# Patient Record
Sex: Female | Born: 1935 | Race: Black or African American | Hispanic: No | Smoking: Former smoker
Health system: Southern US, Community
[De-identification: ages and names within clinical notes are randomized; demographics above are authoritative.]

## PROBLEM LIST (undated history)

## (undated) DIAGNOSIS — D649 Anemia, unspecified: Secondary | ICD-10-CM

## (undated) DIAGNOSIS — M1990 Unspecified osteoarthritis, unspecified site: Secondary | ICD-10-CM

## (undated) DIAGNOSIS — D509 Iron deficiency anemia, unspecified: Secondary | ICD-10-CM

## (undated) DIAGNOSIS — I519 Heart disease, unspecified: Secondary | ICD-10-CM

## (undated) DIAGNOSIS — I5022 Chronic systolic (congestive) heart failure: Secondary | ICD-10-CM

## (undated) DIAGNOSIS — E7800 Pure hypercholesterolemia, unspecified: Secondary | ICD-10-CM

## (undated) DIAGNOSIS — I639 Cerebral infarction, unspecified: Secondary | ICD-10-CM

## (undated) DIAGNOSIS — I442 Atrioventricular block, complete: Secondary | ICD-10-CM

## (undated) DIAGNOSIS — K5900 Constipation, unspecified: Secondary | ICD-10-CM

## (undated) DIAGNOSIS — R51 Headache: Secondary | ICD-10-CM

## (undated) DIAGNOSIS — I255 Ischemic cardiomyopathy: Secondary | ICD-10-CM

## (undated) DIAGNOSIS — R0602 Shortness of breath: Secondary | ICD-10-CM

## (undated) DIAGNOSIS — I10 Essential (primary) hypertension: Secondary | ICD-10-CM

## (undated) DIAGNOSIS — G4730 Sleep apnea, unspecified: Secondary | ICD-10-CM

## (undated) DIAGNOSIS — I2510 Atherosclerotic heart disease of native coronary artery without angina pectoris: Secondary | ICD-10-CM

## (undated) DIAGNOSIS — K900 Celiac disease: Secondary | ICD-10-CM

## (undated) DIAGNOSIS — K219 Gastro-esophageal reflux disease without esophagitis: Secondary | ICD-10-CM

## (undated) DIAGNOSIS — R413 Other amnesia: Secondary | ICD-10-CM

## (undated) HISTORY — DX: Iron deficiency anemia, unspecified: D50.9

## (undated) HISTORY — DX: Chronic systolic (congestive) heart failure: I50.22

## (undated) HISTORY — PX: BACK SURGERY: SHX140

## (undated) HISTORY — DX: Heart disease, unspecified: I51.9

## (undated) HISTORY — DX: Atrioventricular block, complete: I44.2

## (undated) HISTORY — PX: CARDIAC SURGERY: SHX584

## (undated) HISTORY — DX: Constipation, unspecified: K59.00

## (undated) HISTORY — DX: Ischemic cardiomyopathy: I25.5

---
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
## 1998-02-13 ENCOUNTER — Ambulatory Visit (HOSPITAL_COMMUNITY): Admission: RE | Admit: 1998-02-13 | Discharge: 1998-02-13 | Payer: Self-pay | Admitting: Cardiology

## 1998-02-19 ENCOUNTER — Ambulatory Visit (HOSPITAL_COMMUNITY): Admission: RE | Admit: 1998-02-19 | Discharge: 1998-02-19 | Payer: Self-pay | Admitting: Cardiology

## 1998-02-26 ENCOUNTER — Inpatient Hospital Stay (HOSPITAL_COMMUNITY)
Admission: RE | Admit: 1998-02-26 | Discharge: 1998-03-03 | Payer: Self-pay | Admitting: Thoracic Surgery (Cardiothoracic Vascular Surgery)

## 1998-05-08 ENCOUNTER — Encounter (HOSPITAL_COMMUNITY): Admission: RE | Admit: 1998-05-08 | Discharge: 1998-08-06 | Payer: Self-pay | Admitting: Cardiology

## 1999-08-01 ENCOUNTER — Emergency Department (HOSPITAL_COMMUNITY): Admission: EM | Admit: 1999-08-01 | Discharge: 1999-08-01 | Payer: Self-pay | Admitting: *Deleted

## 2000-08-13 ENCOUNTER — Ambulatory Visit (HOSPITAL_COMMUNITY): Admission: RE | Admit: 2000-08-13 | Discharge: 2000-08-13 | Payer: Self-pay | Admitting: Gastroenterology

## 2000-08-13 ENCOUNTER — Encounter (INDEPENDENT_AMBULATORY_CARE_PROVIDER_SITE_OTHER): Payer: Self-pay | Admitting: *Deleted

## 2000-09-18 ENCOUNTER — Encounter: Admission: RE | Admit: 2000-09-18 | Discharge: 2000-09-18 | Payer: Self-pay

## 2000-10-01 ENCOUNTER — Encounter: Admission: RE | Admit: 2000-10-01 | Discharge: 2000-10-01 | Payer: Self-pay

## 2000-10-02 ENCOUNTER — Ambulatory Visit (HOSPITAL_BASED_OUTPATIENT_CLINIC_OR_DEPARTMENT_OTHER): Admission: RE | Admit: 2000-10-02 | Discharge: 2000-10-02 | Payer: Self-pay

## 2000-10-02 ENCOUNTER — Encounter (INDEPENDENT_AMBULATORY_CARE_PROVIDER_SITE_OTHER): Payer: Self-pay

## 2000-10-08 ENCOUNTER — Emergency Department (HOSPITAL_COMMUNITY): Admission: EM | Admit: 2000-10-08 | Discharge: 2000-10-08 | Payer: Self-pay | Admitting: Emergency Medicine

## 2001-04-05 ENCOUNTER — Encounter: Payer: Self-pay | Admitting: Family Medicine

## 2001-04-05 ENCOUNTER — Ambulatory Visit (HOSPITAL_COMMUNITY): Admission: RE | Admit: 2001-04-05 | Discharge: 2001-04-05 | Payer: Self-pay | Admitting: Family Medicine

## 2001-07-26 ENCOUNTER — Emergency Department (HOSPITAL_COMMUNITY): Admission: EM | Admit: 2001-07-26 | Discharge: 2001-07-26 | Payer: Self-pay | Admitting: Emergency Medicine

## 2001-07-26 ENCOUNTER — Encounter: Payer: Self-pay | Admitting: Emergency Medicine

## 2003-11-20 ENCOUNTER — Encounter: Admission: RE | Admit: 2003-11-20 | Discharge: 2003-11-20 | Payer: Self-pay | Admitting: Internal Medicine

## 2003-12-10 ENCOUNTER — Emergency Department (HOSPITAL_COMMUNITY): Admission: EM | Admit: 2003-12-10 | Discharge: 2003-12-10 | Payer: Self-pay | Admitting: *Deleted

## 2003-12-15 ENCOUNTER — Encounter: Admission: RE | Admit: 2003-12-15 | Discharge: 2003-12-15 | Payer: Self-pay | Admitting: Internal Medicine

## 2003-12-15 ENCOUNTER — Ambulatory Visit (HOSPITAL_COMMUNITY): Admission: RE | Admit: 2003-12-15 | Discharge: 2003-12-15 | Payer: Self-pay | Admitting: Internal Medicine

## 2003-12-22 ENCOUNTER — Encounter: Admission: RE | Admit: 2003-12-22 | Discharge: 2003-12-22 | Payer: Self-pay | Admitting: Internal Medicine

## 2004-06-10 ENCOUNTER — Encounter: Admission: RE | Admit: 2004-06-10 | Discharge: 2004-06-10 | Payer: Self-pay | Admitting: Internal Medicine

## 2004-10-01 ENCOUNTER — Encounter (INDEPENDENT_AMBULATORY_CARE_PROVIDER_SITE_OTHER): Payer: Self-pay | Admitting: *Deleted

## 2004-10-01 ENCOUNTER — Ambulatory Visit (HOSPITAL_COMMUNITY): Admission: RE | Admit: 2004-10-01 | Discharge: 2004-10-01 | Payer: Self-pay | Admitting: Gastroenterology

## 2004-12-04 ENCOUNTER — Ambulatory Visit: Payer: Self-pay | Admitting: Internal Medicine

## 2005-12-18 ENCOUNTER — Encounter: Admission: RE | Admit: 2005-12-18 | Discharge: 2005-12-18 | Payer: Self-pay | Admitting: Internal Medicine

## 2006-05-07 ENCOUNTER — Encounter: Admission: RE | Admit: 2006-05-07 | Discharge: 2006-05-07 | Payer: Self-pay | Admitting: Internal Medicine

## 2006-09-30 ENCOUNTER — Inpatient Hospital Stay (HOSPITAL_COMMUNITY): Admission: RE | Admit: 2006-09-30 | Discharge: 2006-10-02 | Payer: Self-pay | Admitting: Neurosurgery

## 2007-03-09 ENCOUNTER — Encounter: Admission: RE | Admit: 2007-03-09 | Discharge: 2007-03-09 | Payer: Self-pay | Admitting: Internal Medicine

## 2007-11-21 ENCOUNTER — Ambulatory Visit (HOSPITAL_BASED_OUTPATIENT_CLINIC_OR_DEPARTMENT_OTHER): Admission: RE | Admit: 2007-11-21 | Discharge: 2007-11-21 | Payer: Self-pay | Admitting: Internal Medicine

## 2007-11-27 ENCOUNTER — Ambulatory Visit: Payer: Self-pay | Admitting: Internal Medicine

## 2008-10-23 ENCOUNTER — Encounter: Admission: RE | Admit: 2008-10-23 | Discharge: 2008-10-23 | Payer: Self-pay | Admitting: Internal Medicine

## 2009-01-05 ENCOUNTER — Emergency Department (HOSPITAL_COMMUNITY): Admission: AC | Admit: 2009-01-05 | Discharge: 2009-01-05 | Payer: Self-pay | Admitting: Emergency Medicine

## 2009-04-16 ENCOUNTER — Encounter: Admission: RE | Admit: 2009-04-16 | Discharge: 2009-04-16 | Payer: Self-pay | Admitting: Internal Medicine

## 2009-10-24 ENCOUNTER — Encounter: Admission: RE | Admit: 2009-10-24 | Discharge: 2009-10-24 | Payer: Self-pay | Admitting: Internal Medicine

## 2010-02-21 ENCOUNTER — Emergency Department (HOSPITAL_COMMUNITY): Admission: EM | Admit: 2010-02-21 | Discharge: 2010-02-21 | Payer: Self-pay | Admitting: Emergency Medicine

## 2011-01-10 ENCOUNTER — Other Ambulatory Visit: Payer: Self-pay | Admitting: Internal Medicine

## 2011-01-10 DIAGNOSIS — Z1231 Encounter for screening mammogram for malignant neoplasm of breast: Secondary | ICD-10-CM

## 2011-01-10 DIAGNOSIS — M8580 Other specified disorders of bone density and structure, unspecified site: Secondary | ICD-10-CM

## 2011-01-20 ENCOUNTER — Other Ambulatory Visit: Payer: Self-pay

## 2011-01-20 ENCOUNTER — Ambulatory Visit: Payer: Self-pay

## 2011-01-30 ENCOUNTER — Other Ambulatory Visit: Payer: Self-pay

## 2011-01-30 ENCOUNTER — Ambulatory Visit: Payer: Self-pay

## 2011-01-30 LAB — GLUCOSE, CAPILLARY: Glucose-Capillary: 94 mg/dL (ref 70–99)

## 2011-02-06 ENCOUNTER — Ambulatory Visit
Admission: RE | Admit: 2011-02-06 | Discharge: 2011-02-06 | Disposition: A | Discharge status: Home / Self Care | Payer: MEDICARE | Source: Ambulatory Visit | Attending: Internal Medicine | Admitting: Internal Medicine

## 2011-02-06 DIAGNOSIS — Z1231 Encounter for screening mammogram for malignant neoplasm of breast: Secondary | ICD-10-CM

## 2011-02-06 DIAGNOSIS — M8580 Other specified disorders of bone density and structure, unspecified site: Secondary | ICD-10-CM

## 2011-03-04 NOTE — Procedures (Signed)
[name], [name]             ACCOUNT NO.:  [id number]   MEDICAL RECORD NO.:  [id number]          PATIENT TYPE:  OUT   LOCATION:  SLEEP CENTER                 FACILITY:  [facility]   PHYSICIAN:  Clinton D. Annamaria Boots, MD, FCCP, FACPDATE OF BIRTH:  1930[birth year]   DATE OF STUDY:  11/21/2007                            NOCTURNAL POLYSOMNOGRAM   REFERRING PHYSICIAN:   INDICATION FOR STUDY:  Hypersomnia with sleep apnea.   EPWORTH SLEEPINESS SCORE:  Age 27 years, BMI 33.1, weight 199 pounds,  height 65 inches, neck 14.5 inches.   MEDICATIONS:  Home medications charted and reviewed.   SLEEP ARCHITECTURE:  Split study protocol.  During the diagnostic phase,  total sleep time 114.5 minutes with sleep efficiency 75.6%.  Stage 1 was  12.2% and stage 2 87.8%.  Stages 3 and REM were absent.  Sleep latency  28 minutes.  Awake after sleep onset 8.5 minutes.  Arousal index 17.8.  No bedtime medication was taken.   RESPIRATORY DATA:  Split study protocol.  Apnea hypopnea index (AHI) of  77 respiratory events per hour, indicating severe obstructive sleep  apnea/hypopnea syndrome.  There were 147 total events with 144  obstructive apneas, 1 central apnea, and 2 hypopneas.  Most events were  recorded while sleeping off of the flat of her back.  CPAP was titrated  to 15 CWP, AHI zero per hour.  She chose a medium size Mirage Quattro  full-face mask with heated humidifier.   OXYGEN DATA:  Moderately loud snoring before CPAP with oxygen  desaturation to a nadir of 80%.  After CPAP titration, oxygen saturation  held at a mean of 95.3% on room air.   CARDIAC DATA:  Normal sinus rhythm.   MOVEMENT-PARASOMNIA:  No significant movement disturbance.  One bathroom  trip.   IMPRESSIONS-RECOMMENDATIONS:  1. Severe obstructive sleep apnea/hypopnea syndrome, AHI 77 per hour.      Most events were recorded while sleeping off the flat of her back.      Moderately loud snoring with oxygen desaturation to a nadir of  80%.  2. Successful CPAP titration to 15 CWP, AHI zero.  She chose a medium      Mirage Quattro mask with heated humidifier.      Clinton D. Annamaria Boots, MD, FCCP, FACP  Diplomate, Tax adviser of Sleep Medicine  Electronically Signed     CDY/MEDQ  D:  11/27/2007 17:53:51  T:  11/29/2007 11:56:19  Job:  [id number]

## 2011-03-07 NOTE — H&P (Signed)
NAMEREAH, [name]             ACCOUNT NO.:  [id number]   MEDICAL RECORD NO.:  [id number]          PATIENT TYPE:  INP   LOCATION:  3006                         FACILITY:  [facility]   PHYSICIAN:  Elizabeth Sauer, M.D.      DATE OF BIRTH:  30-Nov-1927   DATE OF ADMISSION:  09/30/2006  DATE OF DISCHARGE:                              HISTORY & PHYSICAL   ADMISSION DIAGNOSIS:  Spondylosis L3-4, L4-5.   HISTORY:  This is a 77 year old right-handed black lady, who off and on  for a number of years had pain in her back and down her legs.  Recently  had more pain in her leg.  When she is up on them, they bother her a  lot.  She had trouble getting around.  MR demonstrates stenosis at 3-4  and 4-5.  She is admitted for decompression.   PAST MEDICAL HISTORY:  Remarkable for coronary artery disease.   PAST SURGICAL HISTORY:  Bypass nine years ago and has not had any  trouble with it.  She had a stress test in early October and did very  well.   MEDICATIONS:  1. Amlodipine once a day.  2. Actos once a day.  3. Avapro once a day.  4. Metformin twice a day.  5. Glipizide twice a day.  6. Vytorin once a day.  7. Bayer aspirin.   ALLERGIES:  None.   SOCIAL HISTORY:  She does not smoke or drink.  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   FAMILY HISTORY:  Her mom died at 28 with diabetes.  Dad died at 38 with  cancer.   REVIEW OF SYSTEMS:  Remarkable for night sweats, wearing glasses,  glaucoma, and cataracts.  Hypertension, heart murmur,  hypercholesterolemia, leg pain, leg weakness, back pain, joint pain,  arthritis, neck, pain, and diabetes.   PHYSICAL EXAMINATION:  HEENT:  Within normal limits.  NECK:  She has good range of motion of her neck.  CHEST:  Clear.  CARDIAC:  Shows regular rate and rhythm.  ABDOMEN:  Nontender with no hepatosplenomegaly.  EXTREMITIES:  Without clubbing or cyanosis.  Peripheral pulses are good.  GU:  Deferred.  NEUROLOGICALLY:  She is awake, alert,  and oriented.  Her cranial nerves  are intact.  Motor exam shows 5/5 strength throughout the upper and  lower extremities when seated.  Sensory dysesthesia is described in  nondermatomal lower extremities and worse when she is up and about.  Reflexes are absent in the lower extremities.   MR demonstrates mild stenosis at 3-4 and 4-5.  In 4-5 there is a small  disc off to the right.  The majority of the compression comes from  ligamentum flavum hypertrophy and spondylosis of the facet joints.   CLINICAL IMPRESSION:  Neurogenic claudication secondary to spinal  stenosis.   PLAN:  The plan is for decompression at 3-4 and 4-5 with laminotomy and  foraminotomy.  The risks and benefits have been discussed with her and  she wishes to proceed.           ______________________________  Elta Guadeloupe  Mardene Sayer, M.D.     MWR/MEDQ  D:  09/30/2006  T:  10/01/2006  Job:  [id number]

## 2011-03-07 NOTE — Op Note (Signed)
NAMEADRIANNAH, [name]             ACCOUNT NO.:  [id number]   MEDICAL RECORD NO.:  [id number]          PATIENT TYPE:  INP   LOCATION:  3006                         FACILITY:  [facility]   PHYSICIAN:  Elizabeth Sauer, M.D.      DATE OF BIRTH:  [birth year]   DATE OF PROCEDURE:  09/30/2006  DATE OF DISCHARGE:                               OPERATIVE REPORT   PREOPERATIVE DIAGNOSIS:  Spondylosis L3-4, L4-5.   POSTOPERATIVE DIAGNOSIS:  Spondylosis L3-4, L4-5.   OPERATIVE PROCEDURE:  L3-4, L4-5 laminotomy and foraminotomy done  bilaterally.   DICTATING DOCTOR:  Elizabeth Sauer, MD   ANESTHESIA:  General endotracheal.   PREPARATION:  Prep and scrub with alcohol wipe.   COMPLICATIONS:  None.   NURSE ASSISTANT:  [place].   DOCTOR ASSISTANT:  Leeroy Cha, M.D.   BODY OF TEXT:  A 77 year old with severe lumbar spondylosis.   DESCRIPTION OF PROCEDURE:  She was taken to the operating room and  intubated, placed prone on the operating table.  Following shave, prep,  and drape in the usual sterile fashion, the skin was infiltrated with 1%  lidocaine with 1:100,000 epinephrine.  The skin was incised from mid L2  to mid L5 and the lamina of L3 and L4 were exposed bilaterally in the  subperiosteal plane.  Intraoperative x-ray confirmed correctness level.  Bilateral laminotomy and foraminotomy at L3 and L4 were done using the  high speed drill.  This was carried to the top of the ligamentum flavum  and was removed in a retrograde fashion.  The lateral recesses were also  decompressed.  The facet joints were left intact, and a generous portion  of the pars was also left intact.  Following complete decompression, the  neural foramen and the canal were explored with a hook and found to be  open.  The wound was irrigated.  The laminotomy defects covered with  __________  soaked pad.  The 0-Vicryl, 2-0 Vicryl, and 3-0 nylon were  used to close, a Betadine and Telfa dressing was applied, and the  patient  returned to the recovery room in good condition.           ______________________________  Elizabeth Sauer, M.D.     MWR/MEDQ  D:  09/30/2006  T:  10/01/2006  Job:  [id number]

## 2011-03-07 NOTE — Op Note (Signed)
[place]. [facility]  Patient:    [name], [name]                    MRN: [id number] Proc. Date: 10/02/00 Adm. Date:  10/02/00 Attending:  Juanita Craver                           Operative Report  PREOPERATIVE DIAGNOSIS:  Bloody right nipple drainage, probable intraductal papilloma.  POSTOPERATIVE DIAGNOSIS:  Bloody right nipple drainage, probable intraductal papilloma.  PROCEDURE:  Right breast biopsy.  SURGEON:  Ward Givens, M.D.  ANESTHESIA:  Local with sedation.  DESCRIPTION OF PROCEDURE:  After the patient was adequately sedated and monitored, and after routine preparation and draping of the right breast, I liberally infiltrated the area under the nipple and areola and lateral to it with 0.50% bupivacaine with epinephrine.  The patient was comfortable throughout the procedure after the block was done.  I made a lateral circumareolar incision and dissected through the skin and then dissected the skin of the nipple and areola up from the underlying tissues.  As I cut across the ducts going to the nipple I saw blood coming from the duct, and I grasped that duct with an Allis clamp.  I did a generous biopsy around it including essentially all of the subareolar ducts going right up to the nipple.  I did not explore the tissue to try to locate a mass. After getting good hemostasis with cautery I closed the skin with a intercuticular 4-0 Vicryl and Steri-Strips and applied a slightly compressive bandage.  The patient tolerated the operation well. DD:  10/02/00 TD:  10/02/00 Job: [id number]

## 2011-03-07 NOTE — Op Note (Signed)
NAMENETASHA, [name]             ACCOUNT NO.:  [id number]   MEDICAL RECORD NO.:  [id number]          PATIENT TYPE:  AMB   LOCATION:  ENDO                         FACILITY:  [facility]   PHYSICIAN:  Tory Emerald. Benson Norway, MD    DATE OF BIRTH:  03/25/1928   DATE OF PROCEDURE:  10/01/2004  DATE OF DISCHARGE:                                 OPERATIVE REPORT   PROCEDURE:  Colonoscopy.   INDICATIONS FOR PROCEDURE:  Screening colonoscopy and a prior history of  polyps.   ENDOSCOPIST:  Tory Emerald. Benson Norway, M.D.   REFERRING PHYSICIAN:  Logan Bores, M.D.   PHYSICAL EXAMINATION:  CARDIOVASCULAR:  Regular rate and rhythm with some  ectopic beats.  LUNGS:  Clear to auscultation bilaterally.  ABDOMEN:  Obese, soft, nontender and nondistended.  Positive bowel sounds.   MEDICATIONS:  Versed 6 mg IV and Demerol 60 mg IV.   DESCRIPTION OF PROCEDURE:  After adequate sedation was achieved a rectal  examination was performed which was negative for any palpable abnormalities.  The colonoscope was then inserted and advanced from the anus to the terminal  ileum with moderate difficulty.  The patient was noted to have a very  redundant and long colon as well as a floppy abdominal wall which required  multiple repositioning in order to achieve intubation of the terminal ileum.  The patient was noted to have an excellent prep. Photo documentation of the  terminal ileum and the cecum was obtained and upon slow withdrawal of the  colonoscope from the cecum, there was no evidence of any masses, polyps,  ulcerations, erosions, inflammation or vascular abnormalities in the cecum,  ascending, transverse, descending or sigmoid colon.  The patient is noted to  have a very mild pan diverticulosis.  Upon entry into the rectosigmoid  region, the patient is documented to have multiple sessile polyps which  grossly appear to be hyperplastic in nature.  Two cold snare biopsies were  obtained as representative samples of these  polyps.  The colonoscope was  then retroflexed in the rectum and the patient is documented to have a mild  external hemorrhoid.  The colonoscope was then straightened and withdrawn  from the patient and the procedure was terminated.  No complications were  encountered and the patient tolerated the procedure well.   PLAN:  1.  Follow-up with biopsies.  2.  Tentatively repeat colonoscopy in 5 years.       PDH/MEDQ  D:  10/01/2004  T:  10/01/2004  Job:  [id number]   cc:   Logan Bores, M.D.  [address]  [place] [address]  Fax: [phone]

## 2011-03-07 NOTE — Discharge Summary (Signed)
[place]. [facility]  Patient:    [name], [name] Visit Number: [id number] MRN: [id number]          Service Type: EMS Location: ED Attending Physician:  Lacretia Leigh Dictated by:   Jerilynn Mages. Glenford Peers, P.A. Admit Date:  07/26/2001 Discharge Date: 07/26/2001   CC:         Delrae Alfred B. Little, M.D.             Revonda Standard Roxan Hockey, M.D.                           Discharge Summary  ADMISSION DIAGNOSES: 1. Coronary artery disease. 2. Non-insulin-dependent diabetes mellitus. 3. Crescendo angina. 4. Hypertension. 5. History of thyroid nodule. 6. Sciatica. 7. History of pneumonia in January of this year. 8. Family history of cancer, diabetes but no heart disease.  ADMISSION MEDICATION:  Glucophage 500 mg two tablets p.o. q.a.m. 1 tablet p.m.  PROCEDURES:  ______ on Feb 27, 1998, by Dr. Modesto Charon.  Grafts performed included the LIMA to the LAD, saphenous vein graft to the PD, saphenous vein graft to the circ (OM), and a saphenous vein graft to the ramus.  The patient was aware of the risks, benefits, and alternatives for these procedures and was agreeable to procedure.  She suffered no intraoperative complications and left the OR in stable condition.  HOSPITAL COURSE:  The patient is a 77 year old African-American female patient of Dr. Aldona Bar who has known three-vessel LAD. She was referred to Dr. Rex Kras after having episodes of continued exertional chest pressure since January of this year.  She also suffered an episode of pneumonia during that month.  She reports a notable increase in the frequency of her chest pressure now and it occurs at night while she is trying to sleep. There is no dictated history and physical on the patients chart, but she was admitted at this time for further evaluation and work-up with elective CABG planned.  She is a non-insulin-dependent diabetic.  She also has hypertension but no family history of  CAD.  After her procedure she was transferred to the surgical intensive care unit in stable condition and remained intubated.  She was being atrially paced at 90.  Cardiac index 2.2 and she was on nitroglycerin.  Initial hematocrit was 21 and she was transfused with 1 unit of packed red blood cells.  Plan was to wean to extubate.  Postop day #1, the patient was 22 pounds over her preoperative weight and was complaining of incisional pain.  T-max was 101.1, blood pressure was stable and she was in a sinus rhythm.  Cardiac index was greater than 2 and she was diuresed well.  Her hemoglobin and hematocrit were 11 and 33.  BUN and creatinine were stable and her CBG was 188.  The plan was to remove her Luiz Blare, diurese and start her on Lopressor.  She was transferred to the floor later that day.  By the following day she was still quite lethargic and would only wake up for short periods of time.  She got a rather large effect from even one Tylox.  Her chest tube was still intact on Feb 28, 1998, but had minimal drainage and was discontinued.  Her weight was approximately 18 pounds over her preoperative weight but her urine output was good.  Systolic blood pressure was 100 to 120, heart rate 78, and O2 saturations were 99% on 2 L.  The plan  was to wean her off of oxygen, start oral Lasix and remove her Foley.  Her pain meds were decreased.  On Mar 01, 1998, hemoglobin and hematocrit were 8.3 and 24 and she was started on iron and folate.  Her glucose levels were well-controlled but she needed some management with her bowel movements.  She was slightly hypokalemic and this was replaced.  By day #4, she was much improved.  She was only 9 pounds over her preoperative weight and had a T-max of 100.5.  Her O2 saturation was 96% on room air.  CBGs continued to be well-controlled and she was making good progress with ambulation.  She still had episodic shortness of breath.  Her hemoglobin and  hematocrit continued to drift downwards.  The plan was to discharge her home in the next 24-48 hours if she continued to progress.  If she became more profoundly anemic, she would be transfused.  DISCHARGE DIAGNOSES:  1. Status post coronary artery bypass graft x 5.  2. Postoperative anemia.  3. Hypokalemia.  4. Hypertension.  5. Non-insulin-dependent diabetes mellitus.  6. Coronary artery disease.  7. History of thyroid nodule.  8. Sciatica.  DISCHARGE MEDICATIONS:  1. FeSO4 325 mg after lunch and supper.  2. Folic acid 1 mg with supper.  3. Coated aspirin q.d.  4. OptiPranolol 3% one drop daily each eye.  5. Alphagan 2% one drop two times a day each eye.  6. Pepcid 20 mg b.i.d.  7. Lopressor 50 mg one-half tablet b.i.d.  8. Glucophage 500 mg two times a day with meals, to be increased     as her intake increases.  9. Darvocet-N 100 1-2 q.4-6h. as needed. 10. Lasix 40 mg q.d. 11. K-Dur 20 mEq q.d.  FOLLOW-UP:  Should be with Dr. Rex Kras in two weeks, Dr. Roxan Hockey in three weeks with a chest x-ray from [facility].  Her staples will be removed at the CVTS office on May 21 at 9 a.m. Dictated by:   Jerilynn Mages. Glenford Peers, P.A. Attending Physician:  Lacretia Leigh DD:  03/02/98 TD:  03/03/98 Job: [id number]

## 2011-03-07 NOTE — Procedures (Signed)
[place]. [facility]  Patient:    [name], [name]                    MRN: [id number] Proc. Date: 08/13/00 Adm. Date:  [id number] Attending:  Juanita Craver CC:         Guinevere Ferrari, M.D.   Procedure Report  DATE OF BIRTH:  08-30-1928  REFERRING PHYSICIAN:  Guinevere Ferrari, M.D.  PROCEDURE PERFORMED:  Colonoscopy with snare polypectomy x 1.  ENDOSCOPIST:  Nelwyn Salisbury, M.D.  INSTRUMENT USED:  Olympus video colonoscope.  INDICATIONS FOR PROCEDURE:  Screening flexible sigmoidoscopy being performed in a 77 year old black female rule out colonic polyps, masses, hemorrhoids, etc.  PREPROCEDURE PREPARATION:  Informed consent was procured from the patient. The patient was fasted for eight hours prior to the procedure and prepped with a bottle of magnesium citrate and a gallon of NuLytely the night prior to the procedure.  PREPROCEDURE PHYSICAL:  The patient had stable vital signs.  Neck supple. Chest clear to auscultation.  S1, S2 regular.  No murmur, rub or gallop.  No rales, rhonchi or wheezing.  Abdomen soft with normal abdominal bowel sounds.  DESCRIPTION OF PROCEDURE:  The patient was placed in the left lateral decubitus position and sedated with 40 mg of Demerol and 4 mg of Versed intravenously.  Once the patient was adequately sedated and maintained on low-flow oxygen and continuous cardiac monitoring, the Olympus video colonoscope was advanced from the rectum to the cecum without difficulty.  The patient had a fairly good prep.  A flat polyp was removed from the right colon just distal to the cecum by snare polypectomy.  The  rest of the colonic mucosa appeared healthy without lesions, no other masses, polyps, erosions, ulcerations or diverticula were present.  No evidence of hemorrhoids.  The patient tolerated the procedure well without complication.  IMPRESSION:  Flat polyp removed by snare polypectomy from the right colon distal to the  cecum, otherwise normal colonoscopy.  RECOMMENDATIONS: 1. Await pathology results. 2. No nonsteroidals for the next two weeks. 3. Outpatient follow-up in the next two weeks. DD:  08/13/00 TD:  08/14/00 Job: [id number]

## 2011-03-10 ENCOUNTER — Ambulatory Visit
Admission: RE | Admit: 2011-03-10 | Discharge: 2011-03-10 | Disposition: A | Discharge status: Home / Self Care | Payer: Medicare Other | Source: Ambulatory Visit | Attending: Cardiology | Admitting: Cardiology

## 2011-03-10 ENCOUNTER — Other Ambulatory Visit: Payer: Self-pay | Admitting: Cardiology

## 2011-03-10 DIAGNOSIS — R0602 Shortness of breath: Secondary | ICD-10-CM

## 2011-08-06 ENCOUNTER — Inpatient Hospital Stay (HOSPITAL_COMMUNITY)
Admission: EM | Admit: 2011-08-06 | Discharge: 2011-08-08 | DRG: 069 | Disposition: A | Discharge status: Home / Self Care | Payer: Medicare Other | Source: Other Acute Inpatient Hospital | Attending: Neurology | Admitting: Neurology

## 2011-08-06 ENCOUNTER — Emergency Department (HOSPITAL_COMMUNITY): Payer: Medicare Other

## 2011-08-06 ENCOUNTER — Emergency Department (HOSPITAL_COMMUNITY)
Admission: EM | Admit: 2011-08-06 | Discharge: 2011-08-06 | Disposition: A | Discharge status: Other Acute Inpatient Hospital | Payer: Medicare Other | Source: Home / Self Care | Attending: Emergency Medicine | Admitting: Emergency Medicine

## 2011-08-06 DIAGNOSIS — E785 Hyperlipidemia, unspecified: Secondary | ICD-10-CM | POA: Diagnosis present

## 2011-08-06 DIAGNOSIS — Z7902 Long term (current) use of antithrombotics/antiplatelets: Secondary | ICD-10-CM

## 2011-08-06 DIAGNOSIS — G8190 Hemiplegia, unspecified affecting unspecified side: Secondary | ICD-10-CM | POA: Diagnosis present

## 2011-08-06 DIAGNOSIS — Z723 Lack of physical exercise: Secondary | ICD-10-CM

## 2011-08-06 DIAGNOSIS — G459 Transient cerebral ischemic attack, unspecified: Principal | ICD-10-CM | POA: Diagnosis present

## 2011-08-06 DIAGNOSIS — I10 Essential (primary) hypertension: Secondary | ICD-10-CM | POA: Diagnosis present

## 2011-08-06 DIAGNOSIS — M1710 Unilateral primary osteoarthritis, unspecified knee: Secondary | ICD-10-CM | POA: Diagnosis present

## 2011-08-06 DIAGNOSIS — Z6830 Body mass index (BMI) 30.0-30.9, adult: Secondary | ICD-10-CM

## 2011-08-06 DIAGNOSIS — E119 Type 2 diabetes mellitus without complications: Secondary | ICD-10-CM | POA: Diagnosis present

## 2011-08-06 DIAGNOSIS — G4733 Obstructive sleep apnea (adult) (pediatric): Secondary | ICD-10-CM | POA: Diagnosis present

## 2011-08-06 LAB — CK TOTAL AND CKMB (NOT AT ARMC)
CK, MB: 2 ng/mL (ref 0.3–4.0)
Relative Index: 1.8 (ref 0.0–2.5)
Total CK: 110 U/L (ref 7–177)

## 2011-08-06 LAB — CBC
HCT: 35 % — ABNORMAL LOW (ref 36.0–46.0)
Hemoglobin: 11 g/dL — ABNORMAL LOW (ref 12.0–15.0)
MCH: 27.9 pg (ref 26.0–34.0)
MCHC: 31.4 g/dL (ref 30.0–36.0)
MCV: 88.8 fL (ref 78.0–100.0)
RBC: 3.94 MIL/uL (ref 3.87–5.11)

## 2011-08-06 LAB — DIFFERENTIAL
Basophils Absolute: 0 10*3/uL (ref 0.0–0.1)
Basophils Relative: 0 % (ref 0–1)
Neutro Abs: 3 10*3/uL (ref 1.7–7.7)
Neutrophils Relative %: 45 % (ref 43–77)

## 2011-08-06 LAB — APTT: aPTT: 37 seconds (ref 24–37)

## 2011-08-06 LAB — COMPREHENSIVE METABOLIC PANEL
Albumin: 4 g/dL (ref 3.5–5.2)
Alkaline Phosphatase: 95 U/L (ref 39–117)
CO2: 29 mEq/L (ref 19–32)
Creatinine, Ser: 1.08 mg/dL (ref 0.50–1.10)
GFR calc Af Amer: 54 mL/min — ABNORMAL LOW (ref >90)
GFR calc non Af Amer: 47 mL/min — ABNORMAL LOW (ref >90)
Glucose, Bld: 191 mg/dL — ABNORMAL HIGH (ref 70–99)
Potassium: 3.9 mEq/L (ref 3.5–5.1)
Total Bilirubin: 0.3 mg/dL (ref 0.3–1.2)

## 2011-08-06 LAB — TROPONIN I: Troponin I: <0.3 ng/mL (ref <0.30)

## 2011-08-07 ENCOUNTER — Inpatient Hospital Stay (HOSPITAL_COMMUNITY): Payer: Medicare Other

## 2011-08-07 LAB — LIPID PANEL
HDL: 47 mg/dL (ref >39)
LDL Cholesterol: 160 mg/dL — ABNORMAL HIGH (ref 0–99)
Total CHOL/HDL Ratio: 4.8 RATIO

## 2011-08-07 LAB — GLUCOSE, CAPILLARY
Glucose-Capillary: 85 mg/dL (ref 70–99)
Glucose-Capillary: 142 mg/dL — ABNORMAL HIGH (ref 70–99)

## 2011-08-08 LAB — GLUCOSE, CAPILLARY
Glucose-Capillary: 206 mg/dL — ABNORMAL HIGH (ref 70–99)
Glucose-Capillary: 140 mg/dL — ABNORMAL HIGH (ref 70–99)

## 2011-08-11 NOTE — Discharge Summary (Signed)
[name], [name]             ACCOUNT NO.:  [id number]  MEDICAL RECORD NO.:  [id number]  LOCATION:  2993                         FACILITY:  [facility]  PHYSICIAN:  Sruti Ayllon P. Leonie Man, MD    DATE OF BIRTH:  04-Feb-1928  DATE OF ADMISSION:  08/06/2011 DATE OF DISCHARGE:  08/08/2011                              DISCHARGE SUMMARY   DIAGNOSES AT THE TIME OF DISCHARGE: 1. Left hemispheric transient ischemic attack. 2. Osteoarthritis affecting both knees, chronic left leg pain with     reduced ambulation. 3. Obstructive sleep apnea. 4. Morbid obesity with BMI 30.1. 5. Diabetic. 6. Hypertension. 7. Secondary lifestyle. 8. Hyperlipidemia.  MEDICINES AT THE TIME OF DISCHARGE: 1. Plavix 75 mg a day. 2. Aleve 220 mg ever 12 hours as needed for pain. 3. Amlodipine 10 mg a day. 4. Combigan ophthalmic solumtion 1 drop in each eye twice daily. 5. Diovan/hydrocholothiazide 320/25 mg a day. 6. Ferrous sulfate 325 mg a day.7. Leemir 30 units sq at bedtime. 8. Magnesium one tablet at bedtime. 9. Meloxicam 15 mg a day. 10.Metformin 500 mg a day. 11.Nexium 40 mg a day. 12.Pravachol 40 mg a day. 13.Prenatal vitamin a day. 14.Vitamin B-12 one tablet every evening. 15.Vitamin D3 one tablet every evening. 16.Vitamin E one tablet every evening.  STUDIES PERFORMED: 1. CT of the brain on admission shows no acute abnormality. 2. MRI of the brain shows no acute infarct. 3. EKG shows sinus rhythm, probable left atrial abnormality,     nonspecific intraventricular conduction delay, borderline R-wave     progression. 4. Carotid Doppler performed.  Vertebral flow is antegrade.  Formal     reading of carotids is pending. 5. Transcranial Doppler completed results pending. 6. Two-D echocardiogram shows EF of 50-55% with no obvious source of     embolus.  LABORATORY STUDIES:  Hemoglobin A1c of 8.6.  Cholesterol 225, triglycerides 88, HDL 47, LDL 160.  CBC with hemoglobin 11.0, hematocrit 35.0, otherwise  normal.  Differential normal, coagulation studies normal.  Chemistry with glucose of 191, GFR 54, otherwise normal.  Liver function tests normal.  Cardiac enzymes negative.  HISTORY OF PRESENT ILLNESS:  [name] is an 77 year old African American female with chief complaint of expressive aphasia.  The patient went to a funeral the day of admission and noted no difficulties or symptoms while there until about 4 p.m.  Upon arrival at her home, she spoke to her daughter and the daughter knows her words were not slurred but stuttering and not fluent and brought her to the [facility] Emergency Room, where Dr. Rogene Houston evaluated the patient.  NIH stroke scale score was 2 based on progressive aphasia.  She was not a tPA candidate due to low NIH.  She was transferred to [facility] and admitted for further stroke evaluation.  HOSPITAL COURSE:  MRI of the brain was negative for acute stroke.  It was felt the patient had a left brain TIA.  She was on aspirin prior to admission was changed to Plavix for secondary stroke prevention.  Her workup was completed, and she has vascular risk factors of hypertension, dyslipidemia, poorly controlled diabetes, morbid obesity, and sedentary lifestyle.  She will be referred  back to her primary care physician for ongoing evaluation and treatment of her vascular risk factors.  She was evaluated by PT, OT and Speech Therapy and Speech Therapy recommended outpatient Speech Therapy follow up for verbal disfluency but no other therapy needs.  CONDITION AT THE TIME OF DISCHARGE:  The patient alert and oriented to person, place, and situation.  Physician evaluated her speech to have no aphasia, no language difficulty.  The Speech Therapy reports mild-to moderate expressive disfluency with false start and hesitancy with whole word reposition.  Cognition intact.  Her extraocular movements are full, and her visual fields are full.  She has no focal weakness  in her arms and legs.  Her sensation is intact.  Her heart rate is regular.  Her breath sounds are clear.  DISCHARGE PLAN: 1. Discharge home with family. 2. Outpatient Speech Therapy for verbal disfluency. 3. Change aspirin to Plavix for secondary stroke prevention. 4. Ongoing risk factor control by primary care physician. 5. Follow up primary care physician within 1 month. 6. Follow up with Dr. Antony Contras in his office in 2 months.     Burnetta Sabin, N.P.   ______________________________ [name]. Leonie Man, MD    SB/MEDQ  D:  08/08/2011  T:  08/08/2011  Job:  [id number]  cc:   Dr. Valere Dross  Electronically Signed by Burnetta Sabin N.P. on 08/11/2011 04:46:53 PM Electronically Signed by Antony Contras MD on 08/11/2011 04:57:15 PM
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
## 2011-12-27 ENCOUNTER — Emergency Department (HOSPITAL_COMMUNITY): Payer: Medicare Other

## 2011-12-27 ENCOUNTER — Emergency Department (HOSPITAL_COMMUNITY)
Admission: EM | Admit: 2011-12-27 | Discharge: 2011-12-28 | Disposition: A | Discharge status: Home / Self Care | Payer: Medicare Other | Attending: Emergency Medicine | Admitting: Emergency Medicine

## 2011-12-27 ENCOUNTER — Encounter (HOSPITAL_COMMUNITY): Payer: Self-pay | Admitting: *Deleted

## 2011-12-27 ENCOUNTER — Other Ambulatory Visit: Payer: Self-pay

## 2011-12-27 DIAGNOSIS — I498 Other specified cardiac arrhythmias: Secondary | ICD-10-CM | POA: Insufficient documentation

## 2011-12-27 DIAGNOSIS — R011 Cardiac murmur, unspecified: Secondary | ICD-10-CM | POA: Insufficient documentation

## 2011-12-27 DIAGNOSIS — R5381 Other malaise: Secondary | ICD-10-CM | POA: Insufficient documentation

## 2011-12-27 DIAGNOSIS — E1169 Type 2 diabetes mellitus with other specified complication: Secondary | ICD-10-CM | POA: Insufficient documentation

## 2011-12-27 DIAGNOSIS — Z8673 Personal history of transient ischemic attack (TIA), and cerebral infarction without residual deficits: Secondary | ICD-10-CM | POA: Insufficient documentation

## 2011-12-27 DIAGNOSIS — I10 Essential (primary) hypertension: Secondary | ICD-10-CM | POA: Insufficient documentation

## 2011-12-27 DIAGNOSIS — E162 Hypoglycemia, unspecified: Secondary | ICD-10-CM

## 2011-12-27 DIAGNOSIS — Z79899 Other long term (current) drug therapy: Secondary | ICD-10-CM | POA: Insufficient documentation

## 2011-12-27 HISTORY — DX: Unspecified osteoarthritis, unspecified site: M19.90

## 2011-12-27 HISTORY — DX: Pure hypercholesterolemia, unspecified: E78.00

## 2011-12-27 HISTORY — DX: Atherosclerotic heart disease of native coronary artery without angina pectoris: I25.10

## 2011-12-27 HISTORY — DX: Cerebral infarction, unspecified: I63.9

## 2011-12-27 HISTORY — DX: Essential (primary) hypertension: I10

## 2011-12-27 HISTORY — DX: Celiac disease: K90.0

## 2011-12-27 LAB — CBC
HCT: 32.6 % — ABNORMAL LOW (ref 36.0–46.0)
MCH: 28 pg (ref 26.0–34.0)
MCHC: 32.8 g/dL (ref 30.0–36.0)
RDW: 13 % (ref 11.5–15.5)

## 2011-12-27 LAB — DIFFERENTIAL
Basophils Absolute: 0 10*3/uL (ref 0.0–0.1)
Basophils Relative: 0 % (ref 0–1)
Eosinophils Absolute: 0.1 10*3/uL (ref 0.0–0.7)
Eosinophils Relative: 1 % (ref 0–5)
Monocytes Absolute: 0.6 10*3/uL (ref 0.1–1.0)
Neutro Abs: 4.8 10*3/uL (ref 1.7–7.7)

## 2011-12-27 LAB — GLUCOSE, CAPILLARY: Glucose-Capillary: 199 mg/dL — ABNORMAL HIGH (ref 70–99)

## 2011-12-27 LAB — CARDIAC PANEL(CRET KIN+CKTOT+MB+TROPI)
CK, MB: 1.5 ng/mL (ref 0.3–4.0)
Relative Index: INVALID (ref 0.0–2.5)
Total CK: 76 U/L (ref 7–177)

## 2011-12-27 LAB — COMPREHENSIVE METABOLIC PANEL
Albumin: 3.6 g/dL (ref 3.5–5.2)
BUN: 20 mg/dL (ref 6–23)
Calcium: 10 mg/dL (ref 8.4–10.5)
Creatinine, Ser: 1.24 mg/dL — ABNORMAL HIGH (ref 0.50–1.10)
GFR calc Af Amer: 46 mL/min — ABNORMAL LOW (ref >90)
Total Protein: 7.3 g/dL (ref 6.0–8.3)

## 2011-12-27 LAB — URINALYSIS, ROUTINE W REFLEX MICROSCOPIC
Bilirubin Urine: NEGATIVE
Ketones, ur: NEGATIVE mg/dL
Nitrite: NEGATIVE
Protein, ur: NEGATIVE mg/dL
Specific Gravity, Urine: 1.012 (ref 1.005–1.030)
Urobilinogen, UA: 0.2 mg/dL (ref 0.0–1.0)

## 2011-12-27 MED ORDER — DEXTROSE 50 % IV SOLN
INTRAVENOUS | Status: AC
  Filled 2011-12-27: qty 50

## 2011-12-27 MED ORDER — DEXTROSE 50 % IV SOLN
1.0000 | Freq: Once | INTRAVENOUS | Status: AC
  Administered 2011-12-27: 50 mL via INTRAVENOUS

## 2011-12-27 MED ORDER — DEXTROSE 50 % IV SOLN
INTRAVENOUS | Status: AC
  Administered 2011-12-27: 50 mL via INTRAVENOUS
  Filled 2011-12-27: qty 50

## 2011-12-27 NOTE — ED Notes (Signed)
To xray via stretcher, awake, but sleepy, occasionally has eyes closed. Will continue to monitor.

## 2011-12-27 NOTE — ED Notes (Signed)
Family at Bs, pt alert & interactive, denies sx or complaints, denies feeling cold. Denies needs at this time.

## 2011-12-27 NOTE — ED Notes (Signed)
Back from xray, cbg 35, remains alert & interactive, EDP aware, given d50 & happy meal, eating w/o dificulty, family x2 at [facility].

## 2011-12-27 NOTE — ED Notes (Signed)
Pt seen by EDP prior to RN assessment, see MD notes, orders received and initiated.Lab called to BS, xray ready, but delayed d/t lab, EKG done.

## 2011-12-27 NOTE — ED Notes (Addendum)
Here by EMS for low BS, family following, h/o DM, recently stopped metformin & started levemir, "ate only once today", "BPs have been good, HR has been low". Pt alert, but mildly lethargic. Pt of Drs. Little (card), Mann (GI), Moriera (PCP).

## 2011-12-27 NOTE — ED Provider Notes (Signed)
History     CSN: [id number]  Arrival date & time 12/27/11  2000   First MD Initiated Contact with Patient 12/27/11 2003      No chief complaint on file.   (Consider location/radiation/quality/duration/timing/severity/associated sxs/prior treatment) Patient is a 78 y.o. female presenting with weakness. The history is provided by the patient.  Weakness Primary symptoms do not include focal weakness, speech change, fever, nausea or vomiting. Primary symptoms comment: fatigue The symptoms began 6 to 12 hours ago. The episode lasted 1 day. The symptoms are unchanged. The neurological symptoms are diffuse. Context: started new DM and HTN meds today and feeling sleepy all day.  When EMS arrived CBG 34.  Additional symptoms include weakness. Additional symptoms do not include lower back pain, leg pain, loss of balance or anxiety. Medical issues also include cerebral vascular accident. Associated medical issues comments: prior hx of CVA.    No past medical history on file.  No past surgical history on file.  No family history on file.  History  Substance Use Topics  . Smoking status: Not on file  . Smokeless tobacco: Not on file  . Alcohol Use: Not on file    OB History    No data available      Review of Systems  Constitutional: Positive for fatigue. Negative for fever.  Gastrointestinal: Negative for nausea and vomiting.  Neurological: Positive for weakness. Negative for speech change, focal weakness and loss of balance.  All other systems reviewed and are negative.    Allergies  Review of patient's allergies indicates not on file.  Home Medications   Current Outpatient Rx  Name Route Sig Dispense Refill  . METANX 3-35-2 MG PO TABS Oral Take 1 tablet by mouth daily.      There were no vitals taken for this visit.  Physical Exam  Nursing note and vitals reviewed. Constitutional: She is oriented to person, place, and time. She appears well-developed and  well-nourished. No distress.  HENT:  Head: Normocephalic and atraumatic.  Eyes: EOM are normal. Pupils are equal, round, and reactive to light.  Cardiovascular: Regular rhythm and intact distal pulses.  Bradycardia present.  Exam reveals no friction rub.   Murmur heard.  Crescendo systolic murmur is present with a grade of 3/6  Pulmonary/Chest: Effort normal and breath sounds normal. She has no wheezes. She has no rales.  Abdominal: Soft. Bowel sounds are normal. She exhibits no distension. There is no tenderness. There is no rebound and no guarding.  Musculoskeletal: Normal range of motion. She exhibits no edema and no tenderness.       No edema  Neurological: She is alert and oriented to person, place, and time. No cranial nerve deficit.  Skin: Skin is warm and dry. No rash noted.  Psychiatric: She has a normal mood and affect. Her behavior is normal.    ED Course  Procedures (including critical care time)  Labs Reviewed  CBC - Abnormal; Notable for the following:    RBC 3.82 (*)    Hemoglobin 10.7 (*)    HCT 32.6 (*)    All other components within normal limits  COMPREHENSIVE METABOLIC PANEL - Abnormal; Notable for the following:    Potassium 3.0 (*)    Glucose, Bld 52 (*)    Creatinine, Ser 1.24 (*)    GFR calc non Af Amer 40 (*)    GFR calc Af Amer 46 (*)    All other components within normal limits  GLUCOSE, CAPILLARY - Abnormal;  Notable for the following:    Glucose-Capillary 35 (*)    All other components within normal limits  GLUCOSE, CAPILLARY - Abnormal; Notable for the following:    Glucose-Capillary 199 (*)    All other components within normal limits  DIFFERENTIAL  CARDIAC PANEL(CRET KIN+CKTOT+MB+TROPI)  URINALYSIS, ROUTINE W REFLEX MICROSCOPIC   Dg Chest 2 View  12/27/2011  *RADIOLOGY REPORT*  Clinical Data: Hypoglycemia.  CHEST - 2 VIEW  Comparison: 03/10/2011.  Findings: Interval poor inspiration and enlargement of the cardiac silhouette.  Stable post CABG  changes.  Clear lungs with normal vascularity.  Stable tracheal deviation to the right at the level of the aortic arch.  Thoracic spine degenerative changes.  No gross change in a mild mid thoracic vertebral compression deformity.  IMPRESSION:  1.  Interval poor inspiration and mild cardiomegaly. 2.  Stable tracheal deviation to the right.  This could be due to vascular tortuosity or a substernal thyroid goiter or mass.  Original Report Authenticated By: Gerald Stabs, M.D.     Date: 12/27/2011  Rate: 45  Rhythm: sinus bradycardia  QRS Axis: normal  Intervals: normal  ST/T Wave abnormalities: nonspecific ST/T changes  Conduction Disutrbances:nonspecific intraventricular conduction delay  Narrative Interpretation:   Old EKG Reviewed: unchanged    No diagnosis found.    MDM   Patient with hypoglycemia today. Patient states she started new medications today at home but felt like she was eating normally. Blood sugar on EMS arrival was 34. Also patient is bradycardic in the 40s she states she also started new blood pressure medication but unsure what that medication is at this point. Patient has no focal complaints but states she feels sleepy. She denies chest pain, shortness of breath, abdominal pain or vomiting. Her exam is normal except for the bradycardia. CBC, CMP, cardiac enzymes, UA, EKG and chest x-ray pending. A concern for possible medication as the cause of her symptoms versus silent ischemia versus infectious etiology  10:29 PM Patient's labs within normal limits. Family arrived and states that she accidentally took her NovoLog twice today and instead of once and she had not eaten dinner. Patient is also on Levemir but family states she has been on that for a while. Also yesterday she was taken off of multiple blood pressure medications and started on only Diovan. Patient has persistent bradycardia on exam however it is unclear what medication she was taking that she stop today. She  was on a beta blocker that most likely explains her bradycardia. Looking back one year ago her heart rate was in the 50s. Cardiac enzymes are negative and EKG shows sinus bradycardia.      10:32 PM Blood sugar was 199. Patient has eaten and we'll continue to follow blood sugars and make sure she does not drop. 12:11 AM Repeat blood sugar 226. Patient's blood sugar is now been appropriate for the last 4 hours. Feel she is safe for discharge home. Patient was given instructions on how to use her NovoLog to insure no further hypoglycemia.  Blanchie Dessert, MD 12/28/11 [phone]

## 2011-12-28 LAB — GLUCOSE, CAPILLARY: Glucose-Capillary: 226 mg/dL — ABNORMAL HIGH (ref 70–99)

## 2011-12-28 NOTE — Discharge Instructions (Signed)
Checked her blood sugar in the morning and if it is 120 her lower do not take the NovoLog. After eating checked her blood sugar and if it goes up then used the NovoLog until you know how will affect you. Call your doctor on Monday and lightheaded no how things are going in for further instruction of taking the medication.Low Blood Sugar Low blood sugar (hypoglycemia) means that the level of sugar in your blood is lower than it should be. Signs of low blood sugar include:  Getting sweaty.   Feeling hungry.   Feeling dizzy or weak.   Feeling sleepier than normal.   Feeling nervous.   Headaches.   Having a fast heartbeat.  Low blood sugar can happen fast and can be an emergency. Your doctor can do tests to check your blood sugar level. You can have low blood sugar and not have diabetes. HOME CARE  Check your blood sugar as told by your doctor. If it is less than 70 mg/dl or as told by your doctor, take 1 of the following:   3 to 4 glucose tablets.    cup clear juice.    cup soda pop, not diet.   1 cup milk.   5 to 6 hard candies.   Recheck blood sugar after 15 minutes. Repeat until it is at the right level.   Eat a snack if it is more than 1 hour until the next meal.   Only take medicine as told by your doctor.   Do not skip meals. Eat on time.   Do not drink alcohol except with meals.   Check your blood glucose before driving.   Check your blood glucose before and after exercise.   Always carry treatment with you, such as glucose pills.   Always wear a medical alert bracelet if you have diabetes.  GET HELP RIGHT AWAY IF:   Your blood glucose goes below 70 mg/dl or as told by your doctor, and you:   Are confused.   Are not able to swallow.   Pass out (faint).   You cannot treat yourself. You may need someone to help you.   You have low blood sugar problems often.   You have problems from your medicines.   You are not feeling better after 3 to 4 days.    You have vision changes.  MAKE SURE YOU:   Understand these instructions.   Will watch this condition.   Will get help right away if you are not doing well or get worse.  Document Released: 12/31/2009 Document Revised: 09/25/2011 Document Reviewed: 12/31/2009 [facility] Patient Information 2012 [place].

## 2011-12-28 NOTE — ED Notes (Signed)
D/c'd with daughter at [facility], out in w/c with EMT.

## 2012-01-05 ENCOUNTER — Other Ambulatory Visit: Payer: Self-pay | Admitting: Internal Medicine

## 2012-01-05 DIAGNOSIS — Z1231 Encounter for screening mammogram for malignant neoplasm of breast: Secondary | ICD-10-CM

## 2012-02-05 ENCOUNTER — Ambulatory Visit (INDEPENDENT_AMBULATORY_CARE_PROVIDER_SITE_OTHER): Payer: Medicare Other | Admitting: Endocrinology

## 2012-02-05 ENCOUNTER — Encounter: Payer: Self-pay | Admitting: Endocrinology

## 2012-02-05 VITALS — BP 138/70 | HR 65 | Temp 97.0°F | Ht 65.0 in | Wt 186.8 lb

## 2012-02-05 DIAGNOSIS — I798 Other disorders of arteries, arterioles and capillaries in diseases classified elsewhere: Secondary | ICD-10-CM

## 2012-02-05 DIAGNOSIS — E1059 Type 1 diabetes mellitus with other circulatory complications: Secondary | ICD-10-CM

## 2012-02-05 NOTE — Patient Instructions (Addendum)
good diet and exercise habits significanly improve the control of your diabetes.  please let me know if you wish to be referred to a dietician.  high blood sugar is very risky to your health.  you should see an eye doctor every year. controlling your blood pressure and cholesterol drastically reduces the damage diabetes does to your body.  this also applies to quitting smoking.  please discuss these with your doctor.  you should take an aspirin every day, unless you have been advised by a doctor not to. check your blood sugar 2 times a day.  vary the time of day when you check, between before the 3 meals, and at bedtime.  also check if you have symptoms of your blood sugar being too high or too low.  please keep a record of the readings and bring it to your next appointment here.  please call us sooner if your blood sugar goes below 70, or if it stays over 200.  For now, top the novolog, and:  Take the levemir in the morning. Please come back for a follow-up appointment in 2 weeks.

## 2012-02-05 NOTE — Progress Notes (Signed)
Subjective:    Patient ID: [name], female    DOB: February 16, 1928, 78 y.o.   MRN: [id number]  HPI pt states 15 years h/o dm, complicated by cad and cva.  he has been on insulin x a few mos.  pt says her diet is good, but exercise is limited by health probs.   no cbg record, but states cbg's are extremely variable.   Pt states few mos of moderate pain at both legs, and slight assoc numbness.  She was recently seen in ER with severe hypoglycemia, after she accidentally took novolog instead of levemir.   Past Medical History  Diagnosis Date  . Diabetes mellitus   . Hypertension   . Coronary artery disease   . Stroke   . Hypercholesterolemia   . Arthritis   . Celiac disease   . Glaucoma   . Heart disease     Past Surgical History  Procedure Date  . Cardiac surgery     History   Social History  . Marital Status: Widowed    Spouse Name: N/A    Number of Children: 4  . Years of Education: 12   Occupational History  . Retired    Social History Main Topics  . Smoking status: Former Smoker    Quit date: 12/26/1976  . Smokeless tobacco: Not on file  . Alcohol Use: No  . Drug Use: No  . Sexually Active:    Other Topics Concern  . Not on file   Social History Narrative   Regular exercise-noCaffeine Use-yes    Current Outpatient Prescriptions on File Prior to Visit  Medication Sig Dispense Refill  . amLODipine (NORVASC) 10 MG tablet Take 10 mg by mouth daily.      Marland Kitchen aspirin EC 81 MG tablet Take 81 mg by mouth daily.      Marland Kitchen atorvastatin (LIPITOR) 40 MG tablet Take 40 mg by mouth daily.      . clopidogrel (PLAVIX) 75 MG tablet Take 75 mg by mouth daily.      Marland Kitchen esomeprazole (NEXIUM) 40 MG capsule Take 40 mg by mouth daily before breakfast.      . ferrous sulfate 325 (65 FE) MG tablet Take 325 mg by mouth 2 (two) times daily.       . insulin aspart (NOVOLOG) 100 UNIT/ML injection Inject 8 Units into the skin as needed. When blood sugar is elevated      . insulin  detemir (LEVEMIR) 100 UNIT/ML injection Inject 32 Units into the skin daily.       Marland Kitchen latanoprost (XALATAN) 0.005 % ophthalmic solution Place 1 drop into both eyes at bedtime.       . meloxicam (MOBIC) 15 MG tablet Take 15 mg by mouth daily.      . multivitamin (METANX) 3-35-2 MG TABS tablet Take 1 tablet by mouth 2 (two) times daily.       . valsartan-hydrochlorothiazide (DIOVAN-HCT) 320-25 MG per tablet Take 1 tablet by mouth daily.        No Known Allergies  Family History  Problem Relation Age of Onset  . Diabetes Mother   . Hypertension Mother   . Hyperlipidemia Mother   . Cancer Sister   . Dementia Sister   . Neuropathy Sister     BP 138/70  Pulse 65  Temp(Src) 97 F (36.1 C) (Oral)  Ht 5' 5"  (1.651 m)  Wt 186 lb 12.8 oz (84.732 kg)  BMI 31.09 kg/m2  SpO2 94%   Review  of Systems denies weight loss, headache, chest pain, n/v, urinary frequency, excessive diaphoresis, memory loss, depression, menopausal sxs, rhinorrhea, and easy bruising.  She has blurry vision, doe, leg cramps, and headache.    Objective:   Physical Exam VS: see vs page GEN: no distress HEAD: head: no deformity eyes: no periorbital swelling, no proptosis external nose and ears are normal mouth: no lesion seen NECK: supple, thyroid is not enlarged CHEST WALL: no deformity.  Median surgical scar LUNGS:  Clear to auscultation CV: reg rate and rhythm, no murmur ABD: abdomen is soft, nontender.  no hepatosplenomegaly.  not distended.  no hernia MUSCULOSKELETAL: muscle bulk and strength are grossly normal.  no obvious joint swelling.  gait is normal and steady EXTEMITIES: no deformity.  no ulcer on the feet.  feet are of normal color and temp.  Trace bilat leg edema. onychomycosis of toenails PULSES: dorsalis pedis intact bilat.  no carotid bruit NEURO:  cn 2-12 grossly intact.   readily moves all 4's.  sensation is intact to touch on the feet SKIN:  Normal texture and temperature.  No rash or  suspicious lesion is visible.   NODES:  None palpable at the neck PSYCH: alert, oriented x3.  Does not appear anxious nor depressed.   Lab Results  Component Value Date   HGBA1C 8.6* 08/07/2011      Assessment & Plan:  DM. Variable cbg's complicate rx Noncompliance.  Pt is having trouble wit the complexity of the insulin schedule. foot pain and numbness, prob due to DM

## 2012-02-08 ENCOUNTER — Encounter: Payer: Self-pay | Admitting: Endocrinology

## 2012-02-08 DIAGNOSIS — H409 Unspecified glaucoma: Secondary | ICD-10-CM | POA: Insufficient documentation

## 2012-02-08 DIAGNOSIS — I10 Essential (primary) hypertension: Secondary | ICD-10-CM | POA: Insufficient documentation

## 2012-02-08 DIAGNOSIS — I639 Cerebral infarction, unspecified: Secondary | ICD-10-CM | POA: Insufficient documentation

## 2012-02-08 DIAGNOSIS — E119 Type 2 diabetes mellitus without complications: Secondary | ICD-10-CM | POA: Insufficient documentation

## 2012-02-08 DIAGNOSIS — E7800 Pure hypercholesterolemia, unspecified: Secondary | ICD-10-CM | POA: Insufficient documentation

## 2012-02-08 DIAGNOSIS — M1990 Unspecified osteoarthritis, unspecified site: Secondary | ICD-10-CM | POA: Insufficient documentation

## 2012-02-09 ENCOUNTER — Ambulatory Visit: Payer: Medicare Other

## 2012-02-19 ENCOUNTER — Ambulatory Visit: Payer: Medicare Other | Admitting: Endocrinology

## 2012-02-19 DIAGNOSIS — Z0289 Encounter for other administrative examinations: Secondary | ICD-10-CM

## 2012-02-27 ENCOUNTER — Encounter: Payer: Self-pay | Admitting: Endocrinology

## 2012-02-27 ENCOUNTER — Ambulatory Visit (INDEPENDENT_AMBULATORY_CARE_PROVIDER_SITE_OTHER): Payer: Medicare Other | Admitting: Endocrinology

## 2012-02-27 VITALS — BP 126/60 | HR 59 | Temp 98.1°F | Ht 65.0 in | Wt 184.0 lb

## 2012-02-27 DIAGNOSIS — I798 Other disorders of arteries, arterioles and capillaries in diseases classified elsewhere: Secondary | ICD-10-CM

## 2012-02-27 DIAGNOSIS — E1059 Type 1 diabetes mellitus with other circulatory complications: Secondary | ICD-10-CM

## 2012-02-27 NOTE — Patient Instructions (Addendum)
check your blood sugar 2 times a day.  vary the time of day when you check, between before the 3 meals, and at bedtime.  also check if you have symptoms of your blood sugar being too high or too low.  please keep a record of the readings and bring it to your next appointment here.  please call us sooner if your blood sugar goes below 70, or if it stays over 200.  Please continue the same levemir. Please come back for a follow-up appointment in 2 months.

## 2012-02-27 NOTE — Progress Notes (Signed)
  Subjective:    Patient ID: [name], female    DOB: [birth year], 78 y.o.   MRN: [id number]  HPI Pt returns for f/u og IDDM (dx'ed 4627; complicated by CAD and CVA.  She likes the simpler qd insulin schedule.  no cbg record, but states cbg's vary from 90-150.  It is in general higher as the day goes on.  pt states she feels well in general.   Past Medical History  Diagnosis Date  . Coronary artery disease   . Celiac disease   . Heart disease   . Arthritis   . Diabetes mellitus   . Glaucoma   . Hypercholesterolemia   . Hypertension   . Stroke     Past Surgical History  Procedure Date  . Cardiac surgery     History   Social History  . Marital Status: Widowed    Spouse Name: N/A    Number of Children: 4  . Years of Education: 12   Occupational History  . Retired    Social History Main Topics  . Smoking status: Former Smoker    Quit date: 12/26/1976  . Smokeless tobacco: Not on file  . Alcohol Use: No  . Drug Use: No  . Sexually Active:    Other Topics Concern  . Not on file   Social History Narrative   Regular exercise-noCaffeine Use-yes    Current Outpatient Prescriptions on File Prior to Visit  Medication Sig Dispense Refill  . amLODipine (NORVASC) 10 MG tablet Take 10 mg by mouth daily.      Marland Kitchen aspirin EC 81 MG tablet Take 81 mg by mouth daily.      Marland Kitchen atorvastatin (LIPITOR) 40 MG tablet Take 40 mg by mouth daily.      . clopidogrel (PLAVIX) 75 MG tablet Take 75 mg by mouth daily.      Marland Kitchen esomeprazole (NEXIUM) 40 MG capsule Take 40 mg by mouth daily before breakfast.      . ferrous sulfate 325 (65 FE) MG tablet Take 325 mg by mouth 2 (two) times daily.       . insulin detemir (LEVEMIR) 100 UNIT/ML injection Inject 32 Units into the skin every morning.       . latanoprost (XALATAN) 0.005 % ophthalmic solution Place 1 drop into both eyes at bedtime.       . meloxicam (MOBIC) 15 MG tablet Take 15 mg by mouth daily.      . multivitamin (METANX) 3-35-2 MG  TABS tablet Take 1 tablet by mouth 2 (two) times daily.       . valsartan-hydrochlorothiazide (DIOVAN-HCT) 320-25 MG per tablet Take 1 tablet by mouth daily.        No Known Allergies  Family History  Problem Relation Age of Onset  . Diabetes Mother   . Hypertension Mother   . Hyperlipidemia Mother   . Cancer Sister   . Dementia Sister   . Neuropathy Sister     BP 126/60  Pulse 59  Temp(Src) 98.1 F (36.7 C) (Oral)  Ht 5' 5"  (1.651 m)  Wt 184 lb (83.462 kg)  BMI 30.62 kg/m2  SpO2 95%  Review of Systems denies hypoglycemia    Objective:   Physical Exam VITAL SIGNS:  See vs page GENERAL: no distress SKIN:  Insulin injection sites at the anterior abdomen are normal     Assessment & Plan:  DM, therapy limited by pt's need for a simple regimen.  Improved control.

## 2012-04-01 ENCOUNTER — Other Ambulatory Visit: Payer: Self-pay | Admitting: Internal Medicine

## 2012-04-01 DIAGNOSIS — I2510 Atherosclerotic heart disease of native coronary artery without angina pectoris: Secondary | ICD-10-CM

## 2012-04-05 ENCOUNTER — Ambulatory Visit: Payer: Medicare Other

## 2012-04-07 ENCOUNTER — Ambulatory Visit
Admission: RE | Admit: 2012-04-07 | Discharge: 2012-04-07 | Disposition: A | Discharge status: Home / Self Care | Payer: Medicare Other | Source: Ambulatory Visit | Attending: Internal Medicine | Admitting: Internal Medicine

## 2012-04-07 DIAGNOSIS — I2510 Atherosclerotic heart disease of native coronary artery without angina pectoris: Secondary | ICD-10-CM

## 2012-04-19 ENCOUNTER — Ambulatory Visit (INDEPENDENT_AMBULATORY_CARE_PROVIDER_SITE_OTHER): Payer: Medicare Other | Admitting: Endocrinology

## 2012-04-19 ENCOUNTER — Other Ambulatory Visit (INDEPENDENT_AMBULATORY_CARE_PROVIDER_SITE_OTHER): Payer: Medicare Other

## 2012-04-19 ENCOUNTER — Encounter: Payer: Self-pay | Admitting: Endocrinology

## 2012-04-19 VITALS — BP 142/68 | HR 61 | Temp 98.3°F | Ht 65.0 in | Wt 184.0 lb

## 2012-04-19 DIAGNOSIS — E1059 Type 1 diabetes mellitus with other circulatory complications: Secondary | ICD-10-CM

## 2012-04-19 DIAGNOSIS — Z79899 Other long term (current) drug therapy: Secondary | ICD-10-CM

## 2012-04-19 DIAGNOSIS — I798 Other disorders of arteries, arterioles and capillaries in diseases classified elsewhere: Secondary | ICD-10-CM

## 2012-04-19 NOTE — Patient Instructions (Addendum)
blood tests are being requested for you today.  You will receive a letter with results. pending the test results, please increase the insulin to 35 unit daily.   Please come back for a follow-up appointment in 3 months.

## 2012-04-19 NOTE — Progress Notes (Signed)
Subjective:    Patient ID: [name], female    DOB: 10-20-1928, 78 y.o.   MRN: [id number]  HPI Pt returns for f/u of IDDM (dx'ed 3244; complicated by CAD and CVA). She still likes the simple qd insulin schedule.  no cbg record, but states cbg's vary from 120-166.  There is no trend throughout the day. Past Medical History  Diagnosis Date  . Coronary artery disease   . Celiac disease   . Heart disease   . Arthritis   . Diabetes mellitus   . Glaucoma   . Hypercholesterolemia   . Hypertension   . Stroke     Past Surgical History  Procedure Date  . Cardiac surgery     History   Social History  . Marital Status: Widowed    Spouse Name: N/A    Number of Children: 4  . Years of Education: 12   Occupational History  . Retired    Social History Main Topics  . Smoking status: Former Smoker    Quit date: 12/26/1976  . Smokeless tobacco: Not on file  . Alcohol Use: No  . Drug Use: No  . Sexually Active:    Other Topics Concern  . Not on file   Social History Narrative   Regular exercise-noCaffeine Use-yes    Current Outpatient Prescriptions on File Prior to Visit  Medication Sig Dispense Refill  . amLODipine (NORVASC) 10 MG tablet Take 10 mg by mouth daily.      Marland Kitchen aspirin EC 81 MG tablet Take 81 mg by mouth daily.      Marland Kitchen atorvastatin (LIPITOR) 40 MG tablet Take 40 mg by mouth daily.      . clopidogrel (PLAVIX) 75 MG tablet Take 75 mg by mouth daily.      Marland Kitchen esomeprazole (NEXIUM) 40 MG capsule Take 40 mg by mouth daily before breakfast.      . ferrous sulfate 325 (65 FE) MG tablet Take 325 mg by mouth 2 (two) times daily.       Marland Kitchen latanoprost (XALATAN) 0.005 % ophthalmic solution Place 1 drop into both eyes at bedtime.       . meloxicam (MOBIC) 15 MG tablet Take 15 mg by mouth daily.      . multivitamin (METANX) 3-35-2 MG TABS tablet Take 1 tablet by mouth 2 (two) times daily.       . valsartan-hydrochlorothiazide (DIOVAN-HCT) 320-25 MG per tablet Take 1 tablet  by mouth daily.      . insulin detemir (LEVEMIR) 100 UNIT/ML injection Inject 38 Units into the skin every morning.  10 mL  0    No Known Allergies  Family History  Problem Relation Age of Onset  . Diabetes Mother   . Hypertension Mother   . Hyperlipidemia Mother   . Cancer Sister   . Dementia Sister   . Neuropathy Sister     BP 142/68  Pulse 61  Temp 98.3 F (36.8 C) (Oral)  Ht 5' 5"  (1.651 m)  Wt 184 lb (83.462 kg)  BMI 30.62 kg/m2  SpO2 96%   Review of Systems denies hypoglycemia    Objective:   Physical Exam VITAL SIGNS:  See vs page GENERAL: no distress Pulses: dorsalis pedis intact bilat.   Feet: no deformity.  no ulcer on the feet.  feet are of normal color and temp.  Trace bilat leg edema.  Old healed (vein harvest) surgical scar on the left leg.  There is bilateral onychomycosis Neuro: sensation is  intact to touch on the feet.     Lab Results  Component Value Date   HGBA1C 8.3* 04/19/2012      Assessment & Plan:  DM.  needs increased rx

## 2012-04-20 ENCOUNTER — Other Ambulatory Visit: Payer: Self-pay | Admitting: *Deleted

## 2012-04-30 ENCOUNTER — Ambulatory Visit: Payer: Medicare Other | Admitting: Endocrinology

## 2012-06-03 ENCOUNTER — Emergency Department (HOSPITAL_COMMUNITY)
Admission: EM | Admit: 2012-06-03 | Discharge: 2012-06-03 | Disposition: A | Discharge status: Home / Self Care | Payer: Medicare Other | Attending: Emergency Medicine | Admitting: Emergency Medicine

## 2012-06-03 ENCOUNTER — Encounter (HOSPITAL_COMMUNITY): Payer: Self-pay | Admitting: Vascular Surgery

## 2012-06-03 DIAGNOSIS — E119 Type 2 diabetes mellitus without complications: Secondary | ICD-10-CM | POA: Insufficient documentation

## 2012-06-03 DIAGNOSIS — M793 Panniculitis, unspecified: Secondary | ICD-10-CM | POA: Insufficient documentation

## 2012-06-03 DIAGNOSIS — Z79899 Other long term (current) drug therapy: Secondary | ICD-10-CM | POA: Insufficient documentation

## 2012-06-03 DIAGNOSIS — Z87891 Personal history of nicotine dependence: Secondary | ICD-10-CM | POA: Insufficient documentation

## 2012-06-03 DIAGNOSIS — B379 Candidiasis, unspecified: Secondary | ICD-10-CM

## 2012-06-03 DIAGNOSIS — E7800 Pure hypercholesterolemia, unspecified: Secondary | ICD-10-CM | POA: Insufficient documentation

## 2012-06-03 DIAGNOSIS — Z8673 Personal history of transient ischemic attack (TIA), and cerebral infarction without residual deficits: Secondary | ICD-10-CM | POA: Insufficient documentation

## 2012-06-03 DIAGNOSIS — I2510 Atherosclerotic heart disease of native coronary artery without angina pectoris: Secondary | ICD-10-CM | POA: Insufficient documentation

## 2012-06-03 DIAGNOSIS — I10 Essential (primary) hypertension: Secondary | ICD-10-CM | POA: Insufficient documentation

## 2012-06-03 DIAGNOSIS — Z794 Long term (current) use of insulin: Secondary | ICD-10-CM | POA: Insufficient documentation

## 2012-06-03 DIAGNOSIS — M129 Arthropathy, unspecified: Secondary | ICD-10-CM | POA: Insufficient documentation

## 2012-06-03 LAB — GLUCOSE, CAPILLARY: Glucose-Capillary: 226 mg/dL — ABNORMAL HIGH (ref 70–99)

## 2012-06-03 NOTE — ED Notes (Addendum)
Pt has a reddened rash to the left lower abdomen. States she was getting out of the shower on Sunday when she noticed a "sticky" area on her lower left abdomen between skin flaps. She also noticed it today and came in for eval. Area is reddened and irritated. No active bleeding. Small amount of fluid present on assessment. Denies pain or injury to the area.

## 2012-06-03 NOTE — ED Notes (Signed)
Pt has a reddened rash to the left lower abdomen. States she was getting out of the shower on Sunday when she noticed a "sticky" area on her lower left abdomen between skin flaps. She also noticed it today and came in for eval. Area is reddened and irritated. No active bleeding. Small amount of fluid present on assessment. Denies pain and itching.

## 2012-06-03 NOTE — ED Provider Notes (Signed)
History     CSN: [id number]  Arrival date & time 06/03/12  1000   First MD Initiated Contact with Patient 06/03/12 1024      Chief Complaint  Patient presents with  . Abrasion    (Consider location/radiation/quality/duration/timing/severity/associated sxs/prior treatment) The history is provided by the patient.  78 -year-old female noted a rash in her left lower abdomen of 4 days ago. She noticed it while watching and thought she might have had a reaction to the soap that she was using. She denies pain or itching but says it feels sticky. It has not been getting worse, but it has not been improving either. She's not done anything to treat it.  Past Medical History  Diagnosis Date  . Coronary artery disease   . Celiac disease   . Heart disease   . Arthritis   . Diabetes mellitus   . Glaucoma   . Hypercholesterolemia   . Hypertension   . Stroke     Past Surgical History  Procedure Date  . Cardiac surgery   . Back surgery     Family History  Problem Relation Age of Onset  . Diabetes Mother   . Hypertension Mother   . Hyperlipidemia Mother   . Cancer Sister   . Dementia Sister   . Neuropathy Sister     History  Substance Use Topics  . Smoking status: Former Smoker    Quit date: 12/26/1976  . Smokeless tobacco: Not on file  . Alcohol Use: No    OB History    Grav Para Term Preterm Abortions TAB SAB Ect Mult Living   4 4 4       3       Review of Systems  All other systems reviewed and are negative.    Allergies  Review of patient's allergies indicates no known allergies.  Home Medications   Current Outpatient Rx  Name Route Sig Dispense Refill  . AMLODIPINE BESYLATE 10 MG PO TABS Oral Take 10 mg by mouth daily.    . ASPIRIN EC 81 MG PO TBEC Oral Take 81 mg by mouth daily.    . ATORVASTATIN CALCIUM 40 MG PO TABS Oral Take 40 mg by mouth daily.    Marland Kitchen CLOPIDOGREL BISULFATE 75 MG PO TABS Oral Take 75 mg by mouth daily.    . COMBIGAN 0.2-0.5 % OP SOLN       . ESOMEPRAZOLE MAGNESIUM 40 MG PO CPDR Oral Take 40 mg by mouth daily before breakfast.    . FERROUS SULFATE 325 (65 FE) MG PO TABS Oral Take 325 mg by mouth 2 (two) times daily.     . INSULIN DETEMIR 100 UNIT/ML [place] SOLN Subcutaneous Inject 38 Units into the skin every morning. 10 mL 0  . LATANOPROST 0.005 % OP SOLN Both Eyes Place 1 drop into both eyes at bedtime.     . MELOXICAM 15 MG PO TABS Oral Take 15 mg by mouth daily.    Marland Kitchen METANX 3-35-2 MG PO TABS Oral Take 1 tablet by mouth 2 (two) times daily.     Marland Kitchen VALSARTAN-HYDROCHLOROTHIAZIDE 320-25 MG PO TABS Oral Take 1 tablet by mouth daily.      BP 186/53  Pulse 66  Temp 98.4 F (36.9 C) (Oral)  Resp 22  SpO2 99%  Physical Exam  Nursing note and vitals reviewed. 78year old female, resting comfortably and in no acute distress. Vital signs are significant for hypertension with blood pressure 186/53, and mild tachypnea with  history of 22. Oxygen saturation is 99%, which is normal. Head is normocephalic and atraumatic. PERRLA, EOMI. Oropharynx is clear. Neck is nontender and supple without adenopathy or JVD. Back is nontender and there is no CVA tenderness. Lungs are clear without rales, wheezes, or rhonchi. Chest is nontender. Heart has regular rate and rhythm. There is a 2/6 systolic murmur heard along the left sternal border. Abdomen is soft, flat, nontender without masses or hepatosplenomegaly and peristalsis is normoactive. There is an erythematous area in the pannicular fold in the left lower quadrant consistent with monilial dermatitis. Extremities have no cyanosis or edema, full range of motion is present. Skin is warm and dry without other rash. Neurologic: Mental status is normal, cranial nerves are intact, there are no motor or sensory deficits.   ED Course  Procedures (including critical care time)  Results for orders placed during the hospital encounter of 06/03/12  GLUCOSE, CAPILLARY      Component Value Range    Glucose-Capillary 226 (*) 70 - 99 mg/dL    1. Monilial panniculitis       MDM  Monilial dermatitis. CBG will be obtained to make sure that she is not severely hyper glycemic, and she will be sent home with instructions to use topical antifungal agents.        Delora Fuel, MD 81/85/90 9311

## 2012-06-15 ENCOUNTER — Ambulatory Visit: Payer: Medicare Other | Admitting: Endocrinology

## 2012-06-15 DIAGNOSIS — Z0289 Encounter for other administrative examinations: Secondary | ICD-10-CM

## 2012-06-25 ENCOUNTER — Ambulatory Visit: Payer: Medicare Other | Admitting: Endocrinology

## 2012-07-19 ENCOUNTER — Encounter (HOSPITAL_COMMUNITY): Payer: Self-pay | Admitting: *Deleted

## 2012-07-19 ENCOUNTER — Emergency Department (HOSPITAL_COMMUNITY)
Admission: EM | Admit: 2012-07-19 | Discharge: 2012-07-19 | Disposition: A | Discharge status: Home / Self Care | Payer: Medicare Other | Attending: Emergency Medicine | Admitting: Emergency Medicine

## 2012-07-19 DIAGNOSIS — K900 Celiac disease: Secondary | ICD-10-CM | POA: Insufficient documentation

## 2012-07-19 DIAGNOSIS — E1169 Type 2 diabetes mellitus with other specified complication: Secondary | ICD-10-CM | POA: Insufficient documentation

## 2012-07-19 DIAGNOSIS — M129 Arthropathy, unspecified: Secondary | ICD-10-CM | POA: Insufficient documentation

## 2012-07-19 DIAGNOSIS — Z87891 Personal history of nicotine dependence: Secondary | ICD-10-CM | POA: Insufficient documentation

## 2012-07-19 DIAGNOSIS — Z8673 Personal history of transient ischemic attack (TIA), and cerebral infarction without residual deficits: Secondary | ICD-10-CM | POA: Insufficient documentation

## 2012-07-19 DIAGNOSIS — Z794 Long term (current) use of insulin: Secondary | ICD-10-CM | POA: Insufficient documentation

## 2012-07-19 DIAGNOSIS — I2510 Atherosclerotic heart disease of native coronary artery without angina pectoris: Secondary | ICD-10-CM | POA: Insufficient documentation

## 2012-07-19 DIAGNOSIS — I498 Other specified cardiac arrhythmias: Secondary | ICD-10-CM | POA: Insufficient documentation

## 2012-07-19 DIAGNOSIS — R001 Bradycardia, unspecified: Secondary | ICD-10-CM

## 2012-07-19 DIAGNOSIS — I10 Essential (primary) hypertension: Secondary | ICD-10-CM | POA: Insufficient documentation

## 2012-07-19 MED ORDER — SODIUM CHLORIDE 0.9 % IV BOLUS (SEPSIS): 500.0000 mL | Freq: Once | INTRAVENOUS | Status: DC

## 2012-07-19 MED ORDER — GLUCAGON HCL (RDNA) 1 MG IJ SOLR
1.0000 mg | Freq: Once | INTRAMUSCULAR | Status: DC
  Filled 2012-07-19: qty 1

## 2012-07-19 NOTE — ED Notes (Signed)
Pt daughter states pt became increasingly lethargic throughout the day and around 1800 pt. Daughter stated pt began moving "very slow". PT. States she no longer feels weak and feels "alright" when laying down. Pt. Denies any pain or weakness. HR: 39

## 2012-07-19 NOTE — ED Notes (Signed)
Pt ambulated around unit. Pt denies dizziness, lightheadedness, muscle weakness. Pt. States "I feel fine". Pt. HR from 45-66 during ambulation.

## 2012-07-19 NOTE — ED Provider Notes (Signed)
History     CSN: [id number]  Arrival date & time 07/19/12  4098   First MD Initiated Contact with Patient 07/19/12 1919      Chief Complaint  Patient presents with  . Hypoglycemia    (Consider location/radiation/quality/duration/timing/severity/associated sxs/prior Treatment)  HPI  78 yo F presents via EMS for hypoglycemia. Blood sugar on scene was 57. Patient subsequently had a peanut butter and jelly sandwich. BS on arrival in the 90s. Patient was also noted to be bradycardic with 1st degree block on EKG. Recently started on bystolic. Prior records indicated HRs in the mid 60's prior to starting her bystolic. Recommend stopping bystolic until further instruction from PCP.  Patient asymptomatic in ED. BS stable in ED. Patient tolerating PO intake and ambulating without difficulty.     Past Medical History  Diagnosis Date  . Coronary artery disease   . Celiac disease   . Heart disease   . Arthritis   . Diabetes mellitus   . Glaucoma   . Hypercholesterolemia   . Hypertension   . Stroke     Past Surgical History  Procedure Date  . Cardiac surgery   . Back surgery     Family History  Problem Relation Age of Onset  . Diabetes Mother   . Hypertension Mother   . Hyperlipidemia Mother   . Cancer Sister   . Dementia Sister   . Neuropathy Sister     History  Substance Use Topics  . Smoking status: Former Smoker    Quit date: 12/26/1976  . Smokeless tobacco: Not on file  . Alcohol Use: No    OB History    Grav Para Term Preterm Abortions TAB SAB Ect Mult Living   4 4 4       3       Review of Systems  Constitutional: Negative for fever, chills, activity change and appetite change.  HENT: Negative for ear pain, congestion, rhinorrhea and neck pain.   Eyes: Negative for pain.  Respiratory: Negative for cough and shortness of breath.   Cardiovascular: Negative for chest pain and palpitations.  Gastrointestinal: Negative for nausea, vomiting and abdominal pain.    Genitourinary: Negative for dysuria, difficulty urinating and pelvic pain.  Musculoskeletal: Negative for back pain.  Skin: Negative for rash and wound.  Neurological: Positive for light-headedness. Negative for weakness and headaches.  Psychiatric/Behavioral: Negative for behavioral problems, confusion and agitation.    Allergies  Review of patient's allergies indicates no known allergies.  Home Medications   Current Outpatient Rx  Name Route Sig Dispense Refill  . AMLODIPINE BESYLATE 10 MG PO TABS Oral Take 10 mg by mouth daily.    . ASPIRIN EC 81 MG PO TBEC Oral Take 81 mg by mouth daily.    . ATORVASTATIN CALCIUM 40 MG PO TABS Oral Take 40 mg by mouth daily.    Marland Kitchen CLOPIDOGREL BISULFATE 75 MG PO TABS Oral Take 75 mg by mouth daily.    . COMBIGAN 0.2-0.5 % OP SOLN Both Eyes Place 1 drop into both eyes every 12 (twelve) hours.     Marland Kitchen ESOMEPRAZOLE MAGNESIUM 40 MG PO CPDR Oral Take 40 mg by mouth daily before breakfast.    . FERROUS SULFATE 325 (65 FE) MG PO TABS Oral Take 325 mg by mouth 2 (two) times daily.     . INSULIN DETEMIR 100 UNIT/ML [place] SOLN Subcutaneous Inject 40 Units into the skin every morning.  10 mL 0  . LATANOPROST 0.005 % OP SOLN  Both Eyes Place 1 drop into both eyes at bedtime.     . MELOXICAM 15 MG PO TABS Oral Take 15 mg by mouth daily.    Marland Kitchen METANX 3-35-2 MG PO TABS Oral Take 1 tablet by mouth 2 (two) times daily.     Marland Kitchen VALSARTAN-HYDROCHLOROTHIAZIDE 320-25 MG PO TABS Oral Take 1 tablet by mouth daily.      BP 92/47  Pulse 40  Temp 98.1 F (36.7 C) (Oral)  Resp 18  SpO2 100%  Physical Exam  Constitutional: She is oriented to person, place, and time. She appears well-developed and well-nourished. No distress.  HENT:  Head: Normocephalic and atraumatic.  Nose: Nose normal.  Mouth/Throat: Oropharynx is clear and moist.  Eyes: EOM are normal. Pupils are equal, round, and reactive to light.  Neck: Normal range of motion. Neck supple. No tracheal deviation  present.  Cardiovascular: Regular rhythm, normal heart sounds and intact distal pulses.        Bradycardic into 40s  Pulmonary/Chest: Effort normal and breath sounds normal. She has no wheezes. She has no rales. She exhibits no tenderness.  Abdominal: Soft. Bowel sounds are normal. She exhibits no distension. There is no tenderness. There is no rebound and no guarding.  Musculoskeletal: Normal range of motion. She exhibits no tenderness.  Neurological: She is alert and oriented to person, place, and time.  Skin: Skin is warm and dry. No rash noted.  Psychiatric: She has a normal mood and affect. Her behavior is normal.    ED Course  Procedures (including critical care time)     Results for orders placed during the hospital encounter of 07/19/12  GLUCOSE, CAPILLARY      Component Value Range   Glucose-Capillary 110 (*) 70 - 99 mg/dL    1. Bradycardia       MDM    78 yo F in no acute distress, afebrile, vital signs stable, non toxic appearing who presents via EMS for hypoglycemia. Blood sugar on scene was 57. Patient subsequently had a peanut butter and jelly sandwich. BS on arrival in the 90s. Patient was also noted to be bradycardic with 1st degree block on EKG. Recently started on bystolic. Prior records indicated HRs in the mid 60's prior to starting her bystolic. Recommend stopping bystolic until further instruction from PCP.  Patient asymptomatic in ED. BS stable in ED. Patient tolerating PO intake and ambulating without difficulty.  Thorough discussion with patient including return precautions she expressed understanding. Will follow up with PCP.         Ruthell Rummage, MD 07/20/12 [phone]

## 2012-07-19 NOTE — ED Notes (Signed)
Pt in from home via [facility], EMS called to assess CBG per pts family the pt was feeling lethargic, upon EMS arrival the pts CBG was 57, the pt ate a PB & J sandwich & the CBG came up to 90, the pt had a 40 bpm pulse, pt A&O x4, follows commands, speaks in complete sentences, per report the pt started [facility] Sept 61BP with no complications of the medication since then, pt hx of CABG, pt hx of bradycardia with hypoglycemia per the pt

## 2012-07-19 NOTE — ED Notes (Signed)
EDP in to assess

## 2012-07-20 ENCOUNTER — Ambulatory Visit: Payer: Medicare Other | Admitting: Endocrinology

## 2012-07-20 ENCOUNTER — Ambulatory Visit (INDEPENDENT_AMBULATORY_CARE_PROVIDER_SITE_OTHER): Payer: Medicare Other | Admitting: Endocrinology

## 2012-07-20 ENCOUNTER — Encounter: Payer: Self-pay | Admitting: Endocrinology

## 2012-07-20 VITALS — BP 138/80 | HR 75 | Temp 98.7°F | Wt 187.0 lb

## 2012-07-20 DIAGNOSIS — E1059 Type 1 diabetes mellitus with other circulatory complications: Secondary | ICD-10-CM

## 2012-07-20 DIAGNOSIS — I798 Other disorders of arteries, arterioles and capillaries in diseases classified elsewhere: Secondary | ICD-10-CM

## 2012-07-20 NOTE — ED Provider Notes (Signed)
I saw and evaluated the patient, reviewed the resident's note and I agree with the findings and plan.   .Face to face Exam:  General:  Awake HEENT:  Atraumatic Resp:  Normal effort Abd:  Nondistended Neuro:No focal weakness Lymph: No adenopathy   Dot Lanes, MD 07/20/12 2018

## 2012-07-20 NOTE — Progress Notes (Signed)
Subjective:    Patient ID: [name], female    DOB: 1927-11-24, 78 y.o.   MRN: [id number]  HPI Pt returns for f/u of IDDM (dx'ed 5784; complicated by CAD and CVA; she has done better on the simple qd insulin schedule).  no cbg record, but states cbg was in the 50's yesterday afternoon.  She says cbg's have been in the high-100's in am.  However, family is uncertain how cbg's are, as they have run out of strips.  Past Medical History  Diagnosis Date  . Coronary artery disease   . Celiac disease   . Heart disease   . Arthritis   . Diabetes mellitus   . Glaucoma   . Hypercholesterolemia   . Hypertension   . Stroke     Past Surgical History  Procedure Date  . Cardiac surgery   . Back surgery     History   Social History  . Marital Status: Widowed    Spouse Name: N/A    Number of Children: 4  . Years of Education: 12   Occupational History  . Retired    Social History Main Topics  . Smoking status: Former Smoker    Quit date: 12/26/1976  . Smokeless tobacco: Not on file  . Alcohol Use: No  . Drug Use: No  . Sexually Active:    Other Topics Concern  . Not on file   Social History Narrative   Regular exercise-noCaffeine Use-yes    Current Outpatient Prescriptions on File Prior to Visit  Medication Sig Dispense Refill  . amLODipine (NORVASC) 10 MG tablet Take 10 mg by mouth daily.      Marland Kitchen aspirin EC 81 MG tablet Take 81 mg by mouth daily.      Marland Kitchen atorvastatin (LIPITOR) 40 MG tablet Take 40 mg by mouth daily.      . clopidogrel (PLAVIX) 75 MG tablet Take 75 mg by mouth daily.      . COMBIGAN 0.2-0.5 % ophthalmic solution Place 1 drop into both eyes every 12 (twelve) hours.       Marland Kitchen esomeprazole (NEXIUM) 40 MG capsule Take 40 mg by mouth daily before breakfast.      . ferrous sulfate 325 (65 FE) MG tablet Take 325 mg by mouth 2 (two) times daily.       . insulin detemir (LEVEMIR) 100 UNIT/ML injection Inject 40 Units into the skin every morning.   10 mL  0  .  latanoprost (XALATAN) 0.005 % ophthalmic solution Place 1 drop into both eyes at bedtime.       . meloxicam (MOBIC) 15 MG tablet Take 15 mg by mouth daily.      . multivitamin (METANX) 3-35-2 MG TABS tablet Take 1 tablet by mouth 2 (two) times daily.       . nebivolol (BYSTOLIC) 5 MG tablet Take 2.5 mg by mouth daily.      . valsartan-hydrochlorothiazide (DIOVAN-HCT) 320-25 MG per tablet Take 1 tablet by mouth daily.       Current Facility-Administered Medications on File Prior to Visit  Medication Dose Route Frequency Provider Last Rate Last Dose  . DISCONTD: glucagon (GLUCAGEN) injection 1 mg  1 mg Intravenous Once Ruthell Rummage, MD      . DISCONTD: sodium chloride 0.9 % bolus 500 mL  500 mL Intravenous Once Ruthell Rummage, MD        No Known Allergies  Family History  Problem Relation Age of Onset  . Diabetes Mother   .  Hypertension Mother   . Hyperlipidemia Mother   . Cancer Sister   . Dementia Sister   . Neuropathy Sister     BP 138/80  Pulse 75  Temp 98.7 F (37.1 C) (Oral)  Wt 187 lb (84.823 kg)  Review of Systems Denies LOC    Objective:   Physical Exam VITAL SIGNS:  See vs page GENERAL: no distress SKIN:  Insulin injection sites at the anterior abdomen are normal.    Assessment & Plan:  DM, uncertain control

## 2012-07-20 NOTE — Patient Instructions (Addendum)
blood tests are being requested for you today.  You will be contacted with results. Please come back for a follow-up appointment in 3 months. check your blood sugar twice a day.  vary the time of day when you check, between before the 3 meals, and at bedtime.  also check if you have symptoms of your blood sugar being too high or too low.  please keep a record of the readings and bring it to your next appointment here.  please call us sooner if your blood sugar goes below 70, or if you have a lot of readings over 200.

## 2012-08-17 ENCOUNTER — Inpatient Hospital Stay (HOSPITAL_BASED_OUTPATIENT_CLINIC_OR_DEPARTMENT_OTHER)
Admission: RE | Admit: 2012-08-17 | Discharge: 2012-08-17 | Disposition: A | Discharge status: Home / Self Care | Payer: Medicare Other | Source: Ambulatory Visit | Attending: Cardiology | Admitting: Cardiology

## 2012-08-17 ENCOUNTER — Encounter (HOSPITAL_BASED_OUTPATIENT_CLINIC_OR_DEPARTMENT_OTHER)
Admission: RE | Disposition: A | Discharge status: Home / Self Care | Payer: Self-pay | Source: Ambulatory Visit | Attending: Cardiology

## 2012-08-17 DIAGNOSIS — I25810 Atherosclerosis of coronary artery bypass graft(s) without angina pectoris: Secondary | ICD-10-CM | POA: Insufficient documentation

## 2012-08-17 DIAGNOSIS — I2582 Chronic total occlusion of coronary artery: Secondary | ICD-10-CM | POA: Insufficient documentation

## 2012-08-17 DIAGNOSIS — I2510 Atherosclerotic heart disease of native coronary artery without angina pectoris: Secondary | ICD-10-CM | POA: Insufficient documentation

## 2012-08-17 DIAGNOSIS — R0789 Other chest pain: Secondary | ICD-10-CM | POA: Insufficient documentation

## 2012-08-17 SURGERY — JV LEFT HEART CATHETERIZATION WITH CORONARY ANGIOGRAM
Anesthesia: Moderate Sedation

## 2012-08-17 MED ORDER — SODIUM CHLORIDE 0.9 % IV SOLN: INTRAVENOUS | Status: DC

## 2012-08-17 MED ORDER — SODIUM CHLORIDE 0.9 % IJ SOLN: 3.0000 mL | INTRAMUSCULAR | Status: DC | PRN

## 2012-08-17 MED ORDER — ASPIRIN 81 MG PO CHEW
324.0000 mg | CHEWABLE_TABLET | ORAL | Status: AC
  Administered 2012-08-17: 243 mg via ORAL

## 2012-08-17 MED ORDER — ACETAMINOPHEN 325 MG PO TABS: 650.0000 mg | ORAL_TABLET | ORAL | Status: DC | PRN

## 2012-08-17 MED ORDER — SODIUM CHLORIDE 0.9 % IV SOLN: 250.0000 mL | INTRAVENOUS | Status: DC | PRN

## 2012-08-17 MED ORDER — SODIUM CHLORIDE 0.9 % IJ SOLN: 3.0000 mL | Freq: Two times a day (BID) | INTRAMUSCULAR | Status: DC

## 2012-08-17 MED ORDER — DIAZEPAM 5 MG PO TABS
5.0000 mg | ORAL_TABLET | ORAL | Status: AC
  Administered 2012-08-17: 5 mg via ORAL

## 2012-08-17 MED ORDER — ONDANSETRON HCL 4 MG/2ML IJ SOLN: 4.0000 mg | Freq: Four times a day (QID) | INTRAMUSCULAR | Status: DC | PRN

## 2012-08-17 NOTE — CV Procedure (Signed)
Dictated on 08/17/2012 dictation number is [id number]

## 2012-08-17 NOTE — OR Nursing (Signed)
Meal served 

## 2012-08-17 NOTE — OR Nursing (Signed)
Discharge instructions reviewed and signed, pt stated understanding, ambulated in hall without difficulty, site level 0, transported to daughter's car via wheelchair 

## 2012-08-17 NOTE — H&P (Signed)
  Handwritten H&P in the chart needs to be scanned.

## 2012-08-17 NOTE — OR Nursing (Signed)
Tegaderm dressing applied, site level 0, bedrest begins at 0900

## 2012-08-20 NOTE — Cardiovascular Report (Signed)
[name], [name]             ACCOUNT NO.:  [id number]  MEDICAL RECORD NO.:  [id number]  LOCATION:                                 FACILITY:  PHYSICIAN:  Anitria Andon N. Terrence Dupont, M.D. DATE OF BIRTH:  03-21-1928  DATE OF PROCEDURE:  08/17/2012 DATE OF DISCHARGE:                           CARDIAC CATHETERIZATION   PROCEDURE:  Left cardiac cath with selective left and right coronary angiography; visualization of saphenous vein graft, LIMA graft, left ventriculography via right groin using Judkins technique.  INDICATION FOR THE PROCEDURE:  [name] 78 year old female with past medical history significant for coronary artery disease, status post CABG in 1998.  She had LIMA to LAD, saphenous vein graft to PDA, saphenous vein graft to obtuse marginal, and sequential saphenous vein graft to ramus and diagonal 1, hypertension, diabetes mellitus, history of CVA, hypercholesteremia, morbid obesity, celiac disease, history of sleep apnea, on CPAP, complains of retrosternal chest tightness associated with shortness of breath with minimal exertion associated with feeling weak, tired, and fatigue.  Denies any palpitation, lightheadedness, or syncope.  Denies PND, orthopnea, or leg swelling. Denies cough, fever, or chills.  The patient denies any rest or nocturnal angina.  Denies relation of chest pain to food, breathing, or movement.  PAST MEDICAL HISTORY:  As above.  PAST SURGICAL HISTORY:  She had CABG as above in 1998, had back surgery in the past, had bilateral cataract surgery in the past.  ALLERGIES:  No known drug allergies.  MEDICATIONS AT HOME:  She was on losartan HCT 100/25 daily, amlodipine 10 mg p.o. daily, Plavix 75 mg p.o. daily, aspirin 81 mg p.o. daily, Nexium 40 mg p.o. daily, atorvastatin 40 mg p.o. daily.  SOCIAL HISTORY:  She is widowed, retired, worked for [employer] in the past.  No history of smoking or alcohol abuse.  FAMILY HISTORY:  Positive for coronary artery  disease.  PHYSICAL EXAMINATION:  VITAL SIGNS:  Her blood pressure was 150/80, pulse was 63. HEENT:  Conjunctiva was pink. NECK:  Supple.  No JVD.  No bruit. LUNGS:  Clear to auscultation without rhonchi or rales. CARDIOVASCULAR:  S1, S2 was normal.  There was soft systolic murmur and S4 gallop. ABDOMEN:  Soft.  Bowel sounds were present.  Nontender. EXTREMITIES:  There was no clubbing, cyanosis, or edema.  IMPRESSION:  New onset angina, rule out progression of coronary artery disease, status post coronary artery bypass graft in the past; hypertension; diabetes mellitus; hypercholesteremia; history of cerebrovascular accident; morbid obesity; celiac disease; history of obstructive sleep apnea; obesity; hypoventilation syndrome.  I discussed with the patient at length regarding left cath, its risks and benefits, i.e., death, MI, stroke, need for emergency CABG, local vascular complications, etc., and consents for left catheterization.  PROCEDURE NOTE:  After obtaining the informed consent, the patient was brought to the Cath Lab and was placed on fluoroscopy table.  Right groin was prepped and draped in usual fashion.  Xylocaine 1% was used for local anesthesia in the right groin.  With the help of thin wall needle, a 4-French arterial sheath was placed.  The sheath was aspirated and flushed.  Next, 4-French left Judkins catheter was advanced over the wire under fluoroscopic  guidance up to the ascending aorta.  Wire was pulled out, the catheter was aspirated and connected to the Manifold. Catheter was further advanced and engaged into the left coronary ostium. Multiple views of the left system were taken.  Next, the catheter was disengaged and was pulled out over the wire and was replaced with 4- [place] 3D right diagnostic catheter, which was advanced over the wire under fluoroscopic guidance up to the ascending aorta.  Wire was pulled out, the catheter was aspirated and connected to  the Manifold.  Catheter was further advanced and engaged into right coronary ostium.  Multiple views of the right system were taken.  Next, the catheter was disengaged and was engaged into saphenous vein graft to PDA.  A single view of this graft was taken, which was occluded at the ostium.  Next, this catheter was disengaged and engaged into saphenous vein graft to OM-1.  Multiple views of this graft were taken.  Next, the catheter was disengaged and was engaged into the sequential saphenous vein graft to ramus and diagonal 1.  Multiple views of this graft were taken.  Next, the catheter was disengaged and was pulled out over the wire and was replaced with 4-French right diagnostic catheter, which was advanced over the wire under fluoroscopic guidance up to the ascending aorta. Catheter was further advanced over the wire into left subclavian artery. This catheter was exchanged over the wire with IM catheter, which was advanced over the wire under fluoroscopic guidance up to the ascending aorta.  Catheter was further advanced over the wires into the left subclavian.  The wire was pulled out.  The catheter was aspirated and connected to the Manifold.  Catheter was further pulled out and engaged into LIMA to LAD.  Multiple views of this graft were taken.  Next, the catheter was disengaged and was pulled out over the wire and was replaced with 4-French pigtail catheter, which was advanced over the wire under fluoroscopic guidance up to the ascending aorta.  Wire was pulled out, the catheter was aspirated and connected to the Manifold. Catheter was further advanced across the aortic valve into the LV.  LV pressures were recorded.  Next, the left ventriculography was done in 30- degree RAO position.  Post-angiographic pressures were recorded from LV and then pullback pressures were recorded from the aorta.  There was no gradient across the aortic valve.  Next, the pigtail catheter was  pulled out over the wire.  Sheaths were aspirated and flushed.  FINDINGS:  LV showed anterolateral wall hypokinesia, EF of 45-50%.  Left main was very short, which was patent.  LAD has 80-85% focal proximal stenosis and 60-70% mid stenosis distally filling by LIMA, left circumflex is 100% occluded beyond the proximal portion.  OM-1 is very small, which was diffusely diseased.  RCA has 75-80% ostial, 70% proximal sequential and 85-90% mid-stenosis.  Vessel is small.  PDA is 100% occluded.  PLV branches were small, which were diffusely diseased. Sequential saphenous vein graft to ramus and diagonal-1 was patent.  The saphenous vein graft to obtuse marginal was patent.  Saphenous vein graft to PDA was 100% occluded at the ostium.  LIMA to LAD was patent, although the native LAD distally was very small and has 70-75% smooth stenosis with TIMI grade 3 distal flow.  The patient tolerated the procedure well.  There were no complications.  The patient was transferred to recovery room in stable condition.  Plan is to discuss with the patient regarding PCI  to RCA and may need PCI to RCA first if continues to have recurrent chest pain, may need PCI to distal LAD.    Allegra Lai. Terrence Dupont, M.D.    MNH/MEDQ  D:  08/17/2012  T:  08/17/2012  Job:  [id number]

## 2012-08-31 ENCOUNTER — Encounter (HOSPITAL_COMMUNITY): Payer: Self-pay | Admitting: Pharmacy Technician

## 2012-09-02 ENCOUNTER — Encounter (HOSPITAL_COMMUNITY): Payer: Self-pay | Admitting: General Practice

## 2012-09-02 ENCOUNTER — Ambulatory Visit (HOSPITAL_COMMUNITY)
Admission: RE | Admit: 2012-09-02 | Discharge: 2012-09-03 | Disposition: A | Discharge status: Home / Self Care | Payer: Medicare Other | Source: Ambulatory Visit | Attending: Cardiology | Admitting: Cardiology

## 2012-09-02 ENCOUNTER — Encounter (HOSPITAL_COMMUNITY)
Admission: RE | Disposition: A | Discharge status: Home / Self Care | Payer: Self-pay | Source: Ambulatory Visit | Attending: Cardiology

## 2012-09-02 DIAGNOSIS — Z955 Presence of coronary angioplasty implant and graft: Secondary | ICD-10-CM

## 2012-09-02 DIAGNOSIS — E7800 Pure hypercholesterolemia, unspecified: Secondary | ICD-10-CM | POA: Insufficient documentation

## 2012-09-02 DIAGNOSIS — I25810 Atherosclerosis of coronary artery bypass graft(s) without angina pectoris: Secondary | ICD-10-CM | POA: Insufficient documentation

## 2012-09-02 DIAGNOSIS — I200 Unstable angina: Secondary | ICD-10-CM | POA: Insufficient documentation

## 2012-09-02 DIAGNOSIS — Z8673 Personal history of transient ischemic attack (TIA), and cerebral infarction without residual deficits: Secondary | ICD-10-CM | POA: Insufficient documentation

## 2012-09-02 DIAGNOSIS — I2510 Atherosclerotic heart disease of native coronary artery without angina pectoris: Secondary | ICD-10-CM | POA: Insufficient documentation

## 2012-09-02 DIAGNOSIS — E119 Type 2 diabetes mellitus without complications: Secondary | ICD-10-CM | POA: Insufficient documentation

## 2012-09-02 DIAGNOSIS — I10 Essential (primary) hypertension: Secondary | ICD-10-CM | POA: Insufficient documentation

## 2012-09-02 HISTORY — PX: CARDIAC CATHETERIZATION: SHX172

## 2012-09-02 HISTORY — PX: CORONARY ANGIOPLASTY WITH STENT PLACEMENT: SHX49

## 2012-09-02 HISTORY — DX: Headache: R51

## 2012-09-02 HISTORY — DX: Shortness of breath: R06.02

## 2012-09-02 HISTORY — DX: Gastro-esophageal reflux disease without esophagitis: K21.9

## 2012-09-02 HISTORY — PX: PERCUTANEOUS CORONARY STENT INTERVENTION (PCI-S): SHX5485

## 2012-09-02 HISTORY — DX: Anemia, unspecified: D64.9

## 2012-09-02 HISTORY — DX: Sleep apnea, unspecified: G47.30

## 2012-09-02 LAB — GLUCOSE, CAPILLARY

## 2012-09-02 LAB — POCT ACTIVATED CLOTTING TIME: Activated Clotting Time: 369 seconds

## 2012-09-02 LAB — PROTIME-INR
INR: 0.97 (ref 0.00–1.49)
Prothrombin Time: 12.8 seconds (ref 11.6–15.2)

## 2012-09-02 SURGERY — PERCUTANEOUS CORONARY STENT INTERVENTION (PCI-S)
Anesthesia: LOCAL

## 2012-09-02 MED ORDER — ATORVASTATIN CALCIUM 40 MG PO TABS
40.0000 mg | ORAL_TABLET | Freq: Every day | ORAL | Status: DC
  Administered 2012-09-02: 40 mg via ORAL
  Filled 2012-09-02 (×2): qty 1

## 2012-09-02 MED ORDER — SODIUM CHLORIDE 0.9 % IJ SOLN: 3.0000 mL | INTRAMUSCULAR | Status: DC | PRN

## 2012-09-02 MED ORDER — NITROGLYCERIN IN D5W 200-5 MCG/ML-% IV SOLN
10.0000 ug/min | INTRAVENOUS | Status: DC
  Administered 2012-09-02: 12:00:00 10 ug/min via INTRAVENOUS
  Filled 2012-09-02: qty 250

## 2012-09-02 MED ORDER — ACETAMINOPHEN 325 MG PO TABS
650.0000 mg | ORAL_TABLET | ORAL | Status: DC | PRN
  Administered 2012-09-03: 07:00:00 650 mg via ORAL
  Filled 2012-09-02: qty 2

## 2012-09-02 MED ORDER — ASPIRIN 81 MG PO CHEW
324.0000 mg | CHEWABLE_TABLET | ORAL | Status: AC
  Administered 2012-09-02: 324 mg via ORAL

## 2012-09-02 MED ORDER — CLOPIDOGREL BISULFATE 75 MG PO TABS
ORAL_TABLET | ORAL | Status: AC
  Administered 2012-09-03: 75 mg via ORAL
  Filled 2012-09-02: qty 4

## 2012-09-02 MED ORDER — SODIUM BICARBONATE 8.4 % IV SOLN
INTRAVENOUS | Status: AC
  Administered 2012-09-02: 07:00:00 via INTRAVENOUS
  Filled 2012-09-02: qty 1000

## 2012-09-02 MED ORDER — SODIUM CHLORIDE 0.9 % IV SOLN
INTRAVENOUS | Status: DC
  Administered 2012-09-02: 06:00:00 via INTRAVENOUS

## 2012-09-02 MED ORDER — BIVALIRUDIN 250 MG IV SOLR
INTRAVENOUS | Status: AC
  Filled 2012-09-02: qty 250

## 2012-09-02 MED ORDER — MIDAZOLAM HCL 2 MG/2ML IJ SOLN
INTRAMUSCULAR | Status: AC
  Filled 2012-09-02: qty 2

## 2012-09-02 MED ORDER — DIAZEPAM 5 MG PO TABS
5.0000 mg | ORAL_TABLET | ORAL | Status: AC
  Administered 2012-09-02: 5 mg via ORAL

## 2012-09-02 MED ORDER — ONDANSETRON HCL 4 MG/2ML IJ SOLN
4.0000 mg | Freq: Four times a day (QID) | INTRAMUSCULAR | Status: DC | PRN
  Administered 2012-09-02: 17:00:00 4 mg via INTRAVENOUS
  Filled 2012-09-02: qty 2

## 2012-09-02 MED ORDER — LIDOCAINE HCL (PF) 1 % IJ SOLN
INTRAMUSCULAR | Status: AC
  Filled 2012-09-02: qty 30

## 2012-09-02 MED ORDER — INSULIN ASPART 100 UNIT/ML ~~LOC~~ SOLN
0.0000 [IU] | Freq: Three times a day (TID) | SUBCUTANEOUS | Status: DC
Start: 2012-09-02 — End: 2012-09-03
  Administered 2012-09-02: 3 [IU] via SUBCUTANEOUS
  Administered 2012-09-03 (×2): 2 [IU] via SUBCUTANEOUS

## 2012-09-02 MED ORDER — SODIUM CHLORIDE 0.9 % IV SOLN
0.2500 mg/kg/h | INTRAVENOUS | Status: DC
  Filled 2012-09-02: qty 250

## 2012-09-02 MED ORDER — AMLODIPINE BESYLATE 5 MG PO TABS
5.0000 mg | ORAL_TABLET | Freq: Every day | ORAL | Status: DC
  Administered 2012-09-02 – 2012-09-03 (×2): 5 mg via ORAL
  Filled 2012-09-02 (×2): qty 1

## 2012-09-02 MED ORDER — FAMOTIDINE IN NACL 20-0.9 MG/50ML-% IV SOLN
INTRAVENOUS | Status: AC
  Filled 2012-09-02: qty 50

## 2012-09-02 MED ORDER — FENTANYL CITRATE 0.05 MG/ML IJ SOLN
INTRAMUSCULAR | Status: AC
  Filled 2012-09-02: qty 2

## 2012-09-02 MED ORDER — PANTOPRAZOLE SODIUM 40 MG PO TBEC
40.0000 mg | DELAYED_RELEASE_TABLET | Freq: Every day | ORAL | Status: DC
  Administered 2012-09-02 – 2012-09-03 (×2): 40 mg via ORAL
  Filled 2012-09-02 (×2): qty 1

## 2012-09-02 MED ORDER — HEPARIN (PORCINE) IN NACL 2-0.9 UNIT/ML-% IJ SOLN
INTRAMUSCULAR | Status: AC
  Filled 2012-09-02: qty 1000

## 2012-09-02 MED ORDER — SODIUM CHLORIDE 0.9 % IV SOLN
INTRAVENOUS | Status: AC
  Administered 2012-09-02 (×2): via INTRAVENOUS

## 2012-09-02 MED ORDER — NITROGLYCERIN 0.2 MG/ML ON CALL CATH LAB
INTRAVENOUS | Status: AC
  Filled 2012-09-02: qty 1

## 2012-09-02 MED ORDER — DEXTROSE 5 % IV SOLN
INTRAVENOUS | Status: AC
  Filled 2012-09-02: qty 1000

## 2012-09-02 MED ORDER — SODIUM CHLORIDE 0.9 % IV SOLN: 250.0000 mL | INTRAVENOUS | Status: DC | PRN

## 2012-09-02 MED ORDER — DIAZEPAM 5 MG PO TABS
ORAL_TABLET | ORAL | Status: AC
  Filled 2012-09-02: qty 1

## 2012-09-02 MED ORDER — SODIUM CHLORIDE 0.9 % IJ SOLN: 3.0000 mL | Freq: Two times a day (BID) | INTRAMUSCULAR | Status: DC

## 2012-09-02 MED ORDER — MORPHINE SULFATE 2 MG/ML IJ SOLN
2.0000 mg | INTRAMUSCULAR | Status: DC | PRN
  Administered 2012-09-02 (×3): 2 mg via INTRAVENOUS
  Filled 2012-09-02: qty 2
  Filled 2012-09-02: qty 1

## 2012-09-02 MED ORDER — ASPIRIN EC 81 MG PO TBEC
81.0000 mg | DELAYED_RELEASE_TABLET | Freq: Every day | ORAL | Status: DC
  Administered 2012-09-03: 11:00:00 81 mg via ORAL
  Filled 2012-09-02 (×2): qty 1

## 2012-09-02 MED ORDER — ASPIRIN 81 MG PO CHEW
CHEWABLE_TABLET | ORAL | Status: AC
  Filled 2012-09-02: qty 4

## 2012-09-02 MED ORDER — CLOPIDOGREL BISULFATE 75 MG PO TABS
75.0000 mg | ORAL_TABLET | Freq: Every day | ORAL | Status: DC
  Administered 2012-09-03: 75 mg via ORAL
  Filled 2012-09-02: qty 1

## 2012-09-02 MED ORDER — FERROUS SULFATE 325 (65 FE) MG PO TABS
325.0000 mg | ORAL_TABLET | Freq: Two times a day (BID) | ORAL | Status: DC
  Administered 2012-09-02 – 2012-09-03 (×3): 325 mg via ORAL
  Filled 2012-09-02 (×4): qty 1

## 2012-09-02 NOTE — H&P (Signed)
  Handwritten H&P in the chart needs to be scanned

## 2012-09-02 NOTE — Progress Notes (Signed)
Site area: right groin  Site Prior to Removal:  Level 0  Pressure Applied For 20 MINUTES    Minutes Beginning at 1330  Manual:   yes  Patient Status During Pull:  stable  Post Pull Groin Site:  Level 0  Post Pull Instructions Given:  yes  Post Pull Pulses Present:  yes  Dressing Applied:  yes  Comments:

## 2012-09-02 NOTE — Progress Notes (Signed)
Utilization Review Completed.   Kyndle Schlender, RN, BSN Nurse Case Manager  [phone]  

## 2012-09-03 LAB — BASIC METABOLIC PANEL
BUN: 11 mg/dL (ref 6–23)
CO2: 29 mEq/L (ref 19–32)
Chloride: 101 mEq/L (ref 96–112)
Glucose, Bld: 184 mg/dL — ABNORMAL HIGH (ref 70–99)
Potassium: 3.5 mEq/L (ref 3.5–5.1)

## 2012-09-03 LAB — CBC
HCT: 34 % — ABNORMAL LOW (ref 36.0–46.0)
Hemoglobin: 9.5 g/dL — ABNORMAL LOW (ref 12.0–15.0)
MCH: 28.7 pg (ref 26.0–34.0)
MCHC: 27.9 g/dL — ABNORMAL LOW (ref 30.0–36.0)

## 2012-09-03 LAB — GLUCOSE, CAPILLARY: Glucose-Capillary: 188 mg/dL — ABNORMAL HIGH (ref 70–99)

## 2012-09-03 MED ORDER — AMLODIPINE BESYLATE 10 MG PO TABS: 5.0000 mg | ORAL_TABLET | Freq: Every day | ORAL | Status: DC

## 2012-09-03 MED ORDER — NITROGLYCERIN 0.4 MG SL SUBL: 0.4000 mg | SUBLINGUAL_TABLET | SUBLINGUAL | Status: DC | PRN

## 2012-09-03 MED FILL — Dextrose Inj 5%: INTRAVENOUS | Qty: 50 | Status: AC

## 2012-09-03 NOTE — Discharge Summary (Signed)
Discharge redictated on 09/03/2012 dictation number is [id number]

## 2012-09-03 NOTE — Cardiovascular Report (Signed)
[name], [name]             ACCOUNT NO.:  [id number]  MEDICAL RECORD NO.:  [id number]  LOCATION:  6527                         FACILITY:  [facility]  PHYSICIAN:  Allegra Lai. Terrence Dupont, M.D. DATE OF BIRTH:  [birth year]  DATE OF PROCEDURE:  09/02/2012 DATE OF DISCHARGE:                           CARDIAC CATHETERIZATION   PROCEDURES: 1. Successful PTCA to mid and proximal RCA using 2.0 x 15-mm long Trek     balloon and then 2.5 x 12-mm long Emerge balloon and then PTCA to     proximal RCA using 2.75 x 12-mm long Quantum balloon. 2. Successful deployment of 2.5 x 18-mm long Xience Xpedition drug-     eluting stent in mid RCA at 11 atmospheric pressure. 3. Successful postdilatation of this stent using 2.75 x 12-mm long     Quantum balloon going up to 11 atmospheric pressure. 4. Successful deployment of 2.75 x 28-mm long Xience Xpedition drug-     eluting stent in proximal RCA. 5. Successful dilatation of this stent using 3.0 x 15-mm long Quantum     balloon going up to 13-15 atmospheric pressure.  INDICATION FOR THE PROCEDURE:  [name] is 78 year old black female with past medical history significant for coronary artery disease, status post CABG in 1998; hypertension; diabetes mellitus; history of CVA; hypercholesteremia; morbid obesity; celiac disease; history of sleep apnea, on CPAP; complains of retrosternal chest tightness associated with shortness of breath with minimal exertion, associated with feeling tired and weak.  The patient denies any palpitation, lightheadedness, or syncope.  Denies PND, orthopnea, or leg swelling. Denies cough, fever, or chills.  Denies any rest or nocturnal angina. Denies any relation of chest pain to food, breathing, or movement.  Due to typical anginal chest pain and multiple risk factors, the patient subsequently underwent left cardiac cath with selective left and right coronary angiography on August 17, 2012, as per procedure report and was noted  to have critical multiple stenosis in native RCA.  Saphenous vein graft to RCA was 100% occluded.  The patient was brought to the Cath Lab for elective PCI.  Discussed with the patient and her daughter regarding the cath finding and PTCA, stenting to RCA, its risks and benefits, i.e., death, MI, stroke, need for emergency CABG, risk of restenosis, local vascular complications, etc. and consented for PCI.  PROCEDURE:  After obtaining the informed consent, the patient was brought to the Cath Lab and was placed on fluoroscopy table.  Right groin was prepped and draped in usual fashion.  Xylocaine 1% was used for local anesthesia in the right groin.  With the help of thin wall needle, a 6-French arterial sheath was placed.  The sheath was aspirated and flushed.  Next, 6-French multiple guiding catheters were used to engage into the right coronary ostium without success and finally, Al[place] Corning guiding catheter was engaged nonselectively into the right coronary artery.  A single view of this artery was obtained.  FINDINGS:  RCA, ostium non-selective injection appears to have 20-25% ostial stenosis, and has 75-80% proximal stenosis and 60-90% mid- sequential stenosis and 60% mid and distal junction stenosis.  Distally, the vessel was small.  PDA and PLV branches were very small.  INTERVENTIONAL PROCEDURE:  Successful PTCA to mid and proximal RCA was done initially using 2.0 x 15-mm long Trek balloon and then attempted to advance 2.5 x 20-mm long Trek balloon without success and then PTCA to mid and proximal RCA was done using 2.5 x 12-mm long Emerge balloon going up to 8 atmospheric pressure and PTCA to proximal RCA was done using 2.75 x 12-mm long Quantum balloon going up to 20 atmospheric pressure.  Lesions were dilated from 60-90% to less than 40% for predilatation and then 2.5 x 18-mm long Xience Xpedition drug-eluting stent was deployed at 11 atmospheric pressure in mid-RCA.  The stent  was postdilated using 2.75 x 12-mm long Quantum balloon going up to 11 atmospheric pressure.  Next, 2.75 x 28-mm long Xience Xpedition drug- eluting stent was deployed in proximal RCA at 11 atmospheric pressure. The stent was postdilated using 3.0 x 15-mm long Quantum balloon going up to 15 atmospheric pressure.  Lesion was dilated from 60-90% to 0% residual with excellent TIMI grade 3 distal flow without evidence of dissection or distal embolization.  The patient tolerated the procedure well.  There were no complications.  The patient was transferred to recovery room in stable condition.     Allegra Lai. Terrence Dupont, M.D.     MNH/MEDQ  D:  09/02/2012  T:  09/03/2012  Job:  [id number]

## 2012-09-03 NOTE — Progress Notes (Addendum)
CARDIAC REHAB PHASE I   PRE:  Rate/Rhythm: 74 SR  BP:  Supine: 162/51  Sitting:   Standing:    SaO2: 96 RA  MODE:  Ambulation: 16 ft   POST:  Rate/Rhythem: 35 2nd degree A-V block Type 2  BP:  Supine:   Sitting: 152/62  Standing:    SaO2: 96 RA [id number] On arrival pt in bed without c/o. Assisted X 1 to ambulate. Pt grabbing to objects to steady self, staff got walker for her to use. Started walk and got to desk, pt's heart rate dropped to 35. Pt seems foggy, slow to respond,skin moist. Sat pt in chair, heart rate increased to 70's. Rolled pt back to room in wheelchair.In room pt states that  she remembers sitting in hall and that she has been feeling like this every morning when she first gets up. She c/o of feeling weak and right leg pain. Pt put back in bed. Rechecked BP lying 151/60 sitting 144/60 standing 166/51, pt's heart rate stayed in 70's with all positions.placed pt in recliner to eat breakfast. Started stent education with pt and daughter. They voice understanding. Pt agrees to [facility]. CRP in [place], will send referral. We will continue to follow pt. Pt;s RN aware of events.   Deon Pilling

## 2012-09-03 NOTE — Care Management Note (Signed)
    Page 1 of 2   09/03/2012     2:14:28 PM   CARE MANAGEMENT NOTE 09/03/2012  Patient:  [name], [name]   Account Number:  [id number]  Date Initiated:  09/03/2012  Documentation initiated by:  Llana Aliment  Subjective/Objective Assessment:   78yo female admitted for cardiac procedure.  Pt. lives with daughter, however, daughter has to work and pt. is home alone during day.     Action/Plan:   In to speak with pt. and granddaughter about [facility] services. Granddaughter chose [facility]   Anticipated DC Date:  09/03/2012   Anticipated DC Plan:  [place]  CM consult      [facility] Choice  HOME HEALTH   Choice offered to / List presented to:  C-4 Adult Children        HH arranged  HH-1 RN  [place].   Status of service:  Completed, signed off Medicare Important Message given?   (If response is "NO", the following Medicare IM given date fields will be blank) Date Medicare IM given:   Date Additional Medicare IM given:    Discharge Disposition:  [place]  Per UR Regulation:  Reviewed for med. necessity/level of care/duration of stay  If discussed at [place] of Stay Meetings, dates discussed:    Comments:  09/03/12 1400 Pt. concerned about being alone during day. This NCM gave pt. and granddaughter list of private duty agencies.  TC to Butch Penny, with [facility] to give referral for [facility] RN, [facility] aide, and HH CSW to assist pt.  Pt. will be dc home today.  Llana Aliment, RN, BSN NCM [phone]

## 2012-09-04 NOTE — Discharge Summary (Signed)
NAMEGERALYN, [name]             ACCOUNT NO.:  [id number]  MEDICAL RECORD NO.:  [id number]  LOCATION:  6527                         FACILITY:  [facility]  PHYSICIAN:  Allegra Lai. Terrence Dupont, M.D. DATE OF BIRTH:  1928-03-06  DATE OF ADMISSION:  09/02/2012 DATE OF DISCHARGE:  09/03/2012                              DISCHARGE SUMMARY   ADMITTING DIAGNOSES: 1. Accelerated angina, status post recent left catheterization,     coronary artery disease, history of anterior wall myocardial     infarction, status post coronary artery bypass graft in the past. 2. Hypertension. 3. Diabetes mellitus. 4. Hypercholesteremia. 5. History of cerebrovascular accident. 6. Morbid obesity. 7. Celiac disease. 8. Obstructive sleep apnea/obesity hypoventilation syndrome, on     continuous positive airway pressure. 9. Anemia. 10.Chronic kidney disease, stage II.  FINAL DIAGNOSES: 1. Stable angina, status post percutaneous transluminal coronary     angioplasty, stenting to proximal and mid right coronary artery. 2. Coronary artery disease, history of anteroseptal wall myocardial     infarction in the past. 3. Hypertension. 4. Insulin-requiring diabetes mellitus. 5. Hypercholesteremia. 6. History of cerebrovascular accident. 7. Morbid obesity. 8. History of celiac disease. 9. History of obstructive sleep apnea/obesity hypoventilation     syndrome, on continuous positive airway pressure. 10.Acute-on-chronic anemia secondary to hydration and blood loss. 11.Chronic kidney disease, stage II, improved.  DIET:  Low-salt, low-cholesterol, 1800 calories ADA diet.  The patient has been advised to monitor blood pressure and blood sugar daily.  Post-PTCA stent instructions have been given.  The patient will be scheduled for phase 2 cardiac rehab as outpatient.  CONDITION AT DISCHARGE:  Stable.  DISCHARGE MEDICATIONS: 1. Enteric-coated aspirin 81 mg 1 tablet daily. 2. Plavix 75 mg 1 tablet daily. 3. Atorvastatin  40 mg 1 tablet daily. 4. Amlodipine 5 mg 1 tablet daily. 5. Nitrostat 0.4 mg sublingual use as directed. 6. Biotin 1 tablet daily as before. 7. Calcium with vitamin D 1 tablet daily as before. 8. Combigan eye drops as before. 9. Nexium 40 mg 1 capsule daily as before. 10.Feosol 325 mg twice daily as before. 11.Flaxseed oil 1 capsule twice daily as before. 12.Levemir insulin 40 units every morning as before. 13.Xalatan eye drops as before. 14.Multivitamin 1 tablet as before. 15.Niacin 1 tablet daily as before. 16.Diovan HCT 320/25 1 tablet daily as before. 17.Vitamin B12 1 tablet daily as before.  BRIEF HISTORY AND HOSPITAL COURSE:  [name] is an 78 year old female with past medical history significant for coronary artery disease; history of anteroseptal wall myocardial infarction in the past, status post CABG in 1998; hypertension; diabetes mellitus; history of CVA; hypercholesteremia; morbid obesity; celiac disease; history of sleep apnea, on CPAP; complains of retrosternal chest tightness associated with shortness of breath with minimal exertion, associated with feeling weak, tired.  Denies any palpitation, lightheadedness, or syncope. Denies PND, orthopnea, or leg swelling.  Denies cough, fever, chills. Denies rest or nocturnal angina.  Denies any relation of chest pain to food, breathing, or movement.  The patient subsequently underwent cardiac cath with selective left and right coronary angiography, left ventriculography, visualization of saphenous vein graft and LIMA graft as outpatient and was noted to have occluded saphenous vein graft to  RCA and multiple lesions in native RCA.  The bypass to RCA was 100% occluded at the ostium.  The patient was electively admitted for PCI to RCA.  PAST MEDICAL HISTORY:  As above.  PAST SURGICAL HISTORY:  Status post CABG as above in 1998, had back surgery in the past, had bilateral cataract surgery in the past.  ALLERGIES:  No known  drug allergies.  MEDICATIONS:  She is on 1. Diovan HCT 320/25 once a day. 2. Amlodipine 10 mg p.o. daily. 3. Aspirin 81 mg p.o. daily. 4. Plavix 75 mg daily. 5. Nexium 40 mg p.o. daily. 6. Atorvastatin 40 mg daily. 7. Levemir insulin. 8. Vitamins.  SOCIAL HISTORY:  She is widowed, retired, worked for [employer] in the past.  No history of smoking or alcohol abuse.  FAMILY HISTORY:  Positive for coronary artery disease.  PHYSICAL EXAMINATION:  GENERAL:  She was alert, awake, oriented x3, in no acute distress. VITAL SIGNS:  Blood pressure was 150/80, pulse was 63. HEENT:  Conjunctiva was pink. NECK:  Supple.  No JVD.  No bruit. LUNGS:  Clear to auscultation without rhonchi or rales. CARDIOVASCULAR:  S1, S2 was normal.  There was soft, systolic murmur and S4 gallop. ABDOMEN:  Soft.  Bowel sounds were present.  Nontender. EXTREMITIES:  There was no clubbing, cyanosis, or edema.  BRIEF HOSPITAL COURSE:  The patient was a.m. admit and successfully underwent PTCA, stenting to mid and proximal RCA with excellent results. The patient tolerated the procedure well.  There were no complications. Postprocedure, the patient did not have any episodes of chest pain.  Her groin is stable with no evidence of hematoma or bruit.  The patient had one episode of bradycardia with second-degree Mobitz type 1 AV block with narrow QRS complex, narrow QRS escape rhythm with 2:1 block occasionally.  The patient was asymptomatic.  Otherwise, the patient remains in sinus bradycardia with first-degree heart block.  The patient ambulated earlier this morning with rehab without any problems.  The patient will be discharged home on above medications and will be followed up in my office in 1 week.     Allegra Lai. Terrence Dupont, M.D.     MNH/MEDQ  D:  09/03/2012  T:  09/04/2012  Job:  [id number]

## 2012-10-16 ENCOUNTER — Emergency Department (HOSPITAL_COMMUNITY): Payer: No Typology Code available for payment source

## 2012-10-16 ENCOUNTER — Inpatient Hospital Stay (HOSPITAL_COMMUNITY)
Admission: EM | Admit: 2012-10-16 | Discharge: 2012-10-25 | DRG: 552 | Disposition: A | Discharge status: Skilled Nursing Facility | Payer: No Typology Code available for payment source | Attending: Internal Medicine | Admitting: Internal Medicine

## 2012-10-16 ENCOUNTER — Encounter (HOSPITAL_COMMUNITY): Payer: Self-pay | Admitting: Physical Medicine and Rehabilitation

## 2012-10-16 DIAGNOSIS — K219 Gastro-esophageal reflux disease without esophagitis: Secondary | ICD-10-CM | POA: Diagnosis present

## 2012-10-16 DIAGNOSIS — I2510 Atherosclerotic heart disease of native coronary artery without angina pectoris: Secondary | ICD-10-CM

## 2012-10-16 DIAGNOSIS — M129 Arthropathy, unspecified: Secondary | ICD-10-CM | POA: Diagnosis present

## 2012-10-16 DIAGNOSIS — S8000XA Contusion of unspecified knee, initial encounter: Secondary | ICD-10-CM

## 2012-10-16 DIAGNOSIS — Z8673 Personal history of transient ischemic attack (TIA), and cerebral infarction without residual deficits: Secondary | ICD-10-CM

## 2012-10-16 DIAGNOSIS — I10 Essential (primary) hypertension: Secondary | ICD-10-CM

## 2012-10-16 DIAGNOSIS — E119 Type 2 diabetes mellitus without complications: Secondary | ICD-10-CM | POA: Diagnosis present

## 2012-10-16 DIAGNOSIS — H1130 Conjunctival hemorrhage, unspecified eye: Secondary | ICD-10-CM | POA: Diagnosis present

## 2012-10-16 DIAGNOSIS — Z79899 Other long term (current) drug therapy: Secondary | ICD-10-CM

## 2012-10-16 DIAGNOSIS — S0083XA Contusion of other part of head, initial encounter: Secondary | ICD-10-CM

## 2012-10-16 DIAGNOSIS — H1131 Conjunctival hemorrhage, right eye: Secondary | ICD-10-CM

## 2012-10-16 DIAGNOSIS — E1059 Type 1 diabetes mellitus with other circulatory complications: Secondary | ICD-10-CM

## 2012-10-16 DIAGNOSIS — Z794 Long term (current) use of insulin: Secondary | ICD-10-CM

## 2012-10-16 DIAGNOSIS — Z7982 Long term (current) use of aspirin: Secondary | ICD-10-CM

## 2012-10-16 DIAGNOSIS — S129XXA Fracture of neck, unspecified, initial encounter: Secondary | ICD-10-CM

## 2012-10-16 DIAGNOSIS — I639 Cerebral infarction, unspecified: Secondary | ICD-10-CM

## 2012-10-16 DIAGNOSIS — N179 Acute kidney failure, unspecified: Secondary | ICD-10-CM | POA: Diagnosis not present

## 2012-10-16 DIAGNOSIS — I129 Hypertensive chronic kidney disease with stage 1 through stage 4 chronic kidney disease, or unspecified chronic kidney disease: Secondary | ICD-10-CM | POA: Diagnosis present

## 2012-10-16 DIAGNOSIS — S8001XA Contusion of right knee, initial encounter: Secondary | ICD-10-CM

## 2012-10-16 DIAGNOSIS — Z7902 Long term (current) use of antithrombotics/antiplatelets: Secondary | ICD-10-CM

## 2012-10-16 DIAGNOSIS — Y998 Other external cause status: Secondary | ICD-10-CM

## 2012-10-16 DIAGNOSIS — K900 Celiac disease: Secondary | ICD-10-CM | POA: Diagnosis present

## 2012-10-16 DIAGNOSIS — Y92410 Unspecified street and highway as the place of occurrence of the external cause: Secondary | ICD-10-CM

## 2012-10-16 DIAGNOSIS — S022XXA Fracture of nasal bones, initial encounter for closed fracture: Secondary | ICD-10-CM

## 2012-10-16 DIAGNOSIS — M1990 Unspecified osteoarthritis, unspecified site: Secondary | ICD-10-CM

## 2012-10-16 DIAGNOSIS — E7800 Pure hypercholesterolemia, unspecified: Secondary | ICD-10-CM

## 2012-10-16 DIAGNOSIS — H409 Unspecified glaucoma: Secondary | ICD-10-CM

## 2012-10-16 DIAGNOSIS — N189 Chronic kidney disease, unspecified: Secondary | ICD-10-CM | POA: Diagnosis present

## 2012-10-16 DIAGNOSIS — S8002XA Contusion of left knee, initial encounter: Secondary | ICD-10-CM

## 2012-10-16 DIAGNOSIS — S12500A Unspecified displaced fracture of sixth cervical vertebra, initial encounter for closed fracture: Secondary | ICD-10-CM

## 2012-10-16 DIAGNOSIS — S60221A Contusion of right hand, initial encounter: Secondary | ICD-10-CM

## 2012-10-16 DIAGNOSIS — I498 Other specified cardiac arrhythmias: Secondary | ICD-10-CM | POA: Diagnosis not present

## 2012-10-16 DIAGNOSIS — I798 Other disorders of arteries, arterioles and capillaries in diseases classified elsewhere: Secondary | ICD-10-CM | POA: Diagnosis present

## 2012-10-16 MED ORDER — OXYCODONE-ACETAMINOPHEN 5-325 MG PO TABS: 2.0000 | ORAL_TABLET | ORAL | Status: DC | PRN

## 2012-10-16 MED ORDER — BSS IO SOLN: 30.0000 mL | Freq: Once | INTRAOCULAR | Status: DC

## 2012-10-16 MED ORDER — ZOLPIDEM TARTRATE 5 MG PO TABS: 5.0000 mg | ORAL_TABLET | Freq: Every evening | ORAL | Status: DC | PRN

## 2012-10-16 MED ORDER — FLUORESCEIN SODIUM 1 MG OP STRP
1.0000 | ORAL_STRIP | Freq: Once | OPHTHALMIC | Status: AC
  Administered 2012-10-17: 1 via OPHTHALMIC
  Filled 2012-10-16: qty 1

## 2012-10-16 MED ORDER — ACETAMINOPHEN 325 MG PO TABS
650.0000 mg | ORAL_TABLET | Freq: Four times a day (QID) | ORAL | Status: DC | PRN
  Administered 2012-10-17 – 2012-10-24 (×2): 650 mg via ORAL
  Filled 2012-10-16 (×2): qty 2

## 2012-10-16 MED ORDER — FENTANYL CITRATE 0.05 MG/ML IJ SOLN
100.0000 ug | Freq: Once | INTRAMUSCULAR | Status: AC
  Administered 2012-10-16: 100 ug via INTRAVENOUS
  Filled 2012-10-16: qty 2

## 2012-10-16 MED ORDER — ONDANSETRON HCL 4 MG PO TABS: 4.0000 mg | ORAL_TABLET | Freq: Four times a day (QID) | ORAL | Status: DC | PRN

## 2012-10-16 MED ORDER — ERYTHROMYCIN 5 MG/GM OP OINT: TOPICAL_OINTMENT | Freq: Two times a day (BID) | OPHTHALMIC | Status: DC

## 2012-10-16 MED ORDER — INSULIN ASPART 100 UNIT/ML ~~LOC~~ SOLN
0.0000 [IU] | Freq: Three times a day (TID) | SUBCUTANEOUS | Status: DC
  Administered 2012-10-17: 3 [IU] via SUBCUTANEOUS
  Administered 2012-10-17: 2 [IU] via SUBCUTANEOUS
  Administered 2012-10-17: 3 [IU] via SUBCUTANEOUS
  Administered 2012-10-18: 2 [IU] via SUBCUTANEOUS
  Administered 2012-10-18: 1 [IU] via SUBCUTANEOUS
  Administered 2012-10-18: 2 [IU] via SUBCUTANEOUS
  Administered 2012-10-19: 1 [IU] via SUBCUTANEOUS
  Administered 2012-10-19: 2 [IU] via SUBCUTANEOUS
  Administered 2012-10-19: 1 [IU] via SUBCUTANEOUS
  Administered 2012-10-20: 3 [IU] via SUBCUTANEOUS
  Administered 2012-10-20: 5 [IU] via SUBCUTANEOUS

## 2012-10-16 MED ORDER — HYDROMORPHONE HCL PF 1 MG/ML IJ SOLN
0.5000 mg | INTRAMUSCULAR | Status: DC | PRN
  Administered 2012-10-17: 1 mg via INTRAVENOUS
  Filled 2012-10-16: qty 1

## 2012-10-16 MED ORDER — SODIUM CHLORIDE 0.9 % IV SOLN
INTRAVENOUS | Status: DC
  Administered 2012-10-17: 01:00:00 via INTRAVENOUS

## 2012-10-16 MED ORDER — ERYTHROMYCIN 5 MG/GM OP OINT
TOPICAL_OINTMENT | Freq: Two times a day (BID) | OPHTHALMIC | Status: DC
  Administered 2012-10-17: 1 via OPHTHALMIC
  Administered 2012-10-17 – 2012-10-25 (×16): via OPHTHALMIC
  Filled 2012-10-16: qty 3.5
  Filled 2012-10-16: qty 1

## 2012-10-16 MED ORDER — ACETAMINOPHEN 650 MG RE SUPP: 650.0000 mg | Freq: Four times a day (QID) | RECTAL | Status: DC | PRN

## 2012-10-16 MED ORDER — ALUM & MAG HYDROXIDE-SIMETH 200-200-20 MG/5ML PO SUSP: 30.0000 mL | Freq: Four times a day (QID) | ORAL | Status: DC | PRN

## 2012-10-16 MED ORDER — INSULIN ASPART 100 UNIT/ML ~~LOC~~ SOLN
0.0000 [IU] | Freq: Every day | SUBCUTANEOUS | Status: DC
Start: 2012-10-17 — End: 2012-10-21
  Administered 2012-10-19: 2 [IU] via SUBCUTANEOUS

## 2012-10-16 MED ORDER — OXYCODONE HCL 5 MG PO TABS
5.0000 mg | ORAL_TABLET | ORAL | Status: DC | PRN
  Administered 2012-10-17 – 2012-10-25 (×12): 5 mg via ORAL
  Filled 2012-10-16 (×15): qty 1

## 2012-10-16 MED ORDER — TETRACAINE HCL 0.5 % OP SOLN
2.0000 [drp] | Freq: Once | OPHTHALMIC | Status: AC
  Administered 2012-10-17: 2 [drp] via OPHTHALMIC
  Filled 2012-10-16: qty 2

## 2012-10-16 MED ORDER — ONDANSETRON HCL 4 MG/2ML IJ SOLN: 4.0000 mg | Freq: Four times a day (QID) | INTRAMUSCULAR | Status: DC | PRN

## 2012-10-16 NOTE — ED Notes (Signed)
Pt to be discharged home. [facility] called regarding walker. Pt and family updated.
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
## 2012-10-16 NOTE — ED Notes (Signed)
Pt transported to CT and x-ray. Medicated for pain.
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
## 2012-10-17 ENCOUNTER — Encounter (HOSPITAL_COMMUNITY): Payer: Self-pay | Admitting: General Practice

## 2012-10-17 ENCOUNTER — Observation Stay (HOSPITAL_COMMUNITY): Payer: No Typology Code available for payment source

## 2012-10-17 DIAGNOSIS — S129XXA Fracture of neck, unspecified, initial encounter: Secondary | ICD-10-CM

## 2012-10-17 DIAGNOSIS — I6350 Cerebral infarction due to unspecified occlusion or stenosis of unspecified cerebral artery: Secondary | ICD-10-CM

## 2012-10-17 DIAGNOSIS — S1093XA Contusion of unspecified part of neck, initial encounter: Secondary | ICD-10-CM

## 2012-10-17 DIAGNOSIS — S12500A Unspecified displaced fracture of sixth cervical vertebra, initial encounter for closed fracture: Principal | ICD-10-CM

## 2012-10-17 DIAGNOSIS — H1131 Conjunctival hemorrhage, right eye: Secondary | ICD-10-CM | POA: Diagnosis present

## 2012-10-17 DIAGNOSIS — S0083XA Contusion of other part of head, initial encounter: Secondary | ICD-10-CM | POA: Diagnosis present

## 2012-10-17 DIAGNOSIS — E1059 Type 1 diabetes mellitus with other circulatory complications: Secondary | ICD-10-CM

## 2012-10-17 DIAGNOSIS — I10 Essential (primary) hypertension: Secondary | ICD-10-CM

## 2012-10-17 DIAGNOSIS — S022XXA Fracture of nasal bones, initial encounter for closed fracture: Secondary | ICD-10-CM

## 2012-10-17 DIAGNOSIS — I2510 Atherosclerotic heart disease of native coronary artery without angina pectoris: Secondary | ICD-10-CM | POA: Diagnosis present

## 2012-10-17 DIAGNOSIS — E7800 Pure hypercholesterolemia, unspecified: Secondary | ICD-10-CM

## 2012-10-17 DIAGNOSIS — H409 Unspecified glaucoma: Secondary | ICD-10-CM

## 2012-10-17 DIAGNOSIS — S60229A Contusion of unspecified hand, initial encounter: Secondary | ICD-10-CM

## 2012-10-17 DIAGNOSIS — S8000XA Contusion of unspecified knee, initial encounter: Secondary | ICD-10-CM

## 2012-10-17 DIAGNOSIS — H1130 Conjunctival hemorrhage, unspecified eye: Secondary | ICD-10-CM

## 2012-10-17 DIAGNOSIS — S60221A Contusion of right hand, initial encounter: Secondary | ICD-10-CM | POA: Diagnosis present

## 2012-10-17 DIAGNOSIS — M129 Arthropathy, unspecified: Secondary | ICD-10-CM

## 2012-10-17 LAB — GLUCOSE, CAPILLARY
Glucose-Capillary: 225 mg/dL — ABNORMAL HIGH (ref 70–99)
Glucose-Capillary: 159 mg/dL — ABNORMAL HIGH (ref 70–99)
Glucose-Capillary: 133 mg/dL — ABNORMAL HIGH (ref 70–99)

## 2012-10-17 LAB — BASIC METABOLIC PANEL
Calcium: 9.9 mg/dL (ref 8.4–10.5)
GFR calc Af Amer: 49 mL/min — ABNORMAL LOW (ref >90)
GFR calc non Af Amer: 43 mL/min — ABNORMAL LOW (ref >90)
Sodium: 136 mEq/L (ref 135–145)

## 2012-10-17 LAB — CBC
Platelets: 248 10*3/uL (ref 150–400)
RBC: 3.53 MIL/uL — ABNORMAL LOW (ref 3.87–5.11)
WBC: 7.2 10*3/uL (ref 4.0–10.5)

## 2012-10-17 LAB — HEMOGLOBIN A1C
Hgb A1c MFr Bld: 8.4 % — ABNORMAL HIGH (ref <5.7)
Mean Plasma Glucose: 194 mg/dL — ABNORMAL HIGH (ref <117)

## 2012-10-17 MED ORDER — CALCIUM CARBONATE-VITAMIN D 500-200 MG-UNIT PO TABS
1.0000 | ORAL_TABLET | Freq: Every day | ORAL | Status: DC
  Administered 2012-10-17 – 2012-10-25 (×9): 1 via ORAL
  Filled 2012-10-17 (×9): qty 1

## 2012-10-17 MED ORDER — NITROGLYCERIN 0.4 MG SL SUBL
0.4000 mg | SUBLINGUAL_TABLET | SUBLINGUAL | Status: DC | PRN
Start: 2012-10-16 — End: 2012-10-25

## 2012-10-17 MED ORDER — AMLODIPINE BESYLATE 5 MG PO TABS
5.0000 mg | ORAL_TABLET | Freq: Every day | ORAL | Status: DC
  Administered 2012-10-17 – 2012-10-25 (×9): 5 mg via ORAL
  Filled 2012-10-17 (×9): qty 1

## 2012-10-17 MED ORDER — ATORVASTATIN CALCIUM 80 MG PO TABS
80.0000 mg | ORAL_TABLET | Freq: Every day | ORAL | Status: DC
  Administered 2012-10-17 – 2012-10-24 (×8): 80 mg via ORAL
  Filled 2012-10-17 (×9): qty 1

## 2012-10-17 MED ORDER — IOHEXOL 300 MG/ML  SOLN
100.0000 mL | Freq: Once | INTRAMUSCULAR | Status: AC | PRN
  Administered 2012-10-17: 100 mL via INTRAVENOUS

## 2012-10-17 MED ORDER — ASPIRIN EC 81 MG PO TBEC
81.0000 mg | DELAYED_RELEASE_TABLET | Freq: Every day | ORAL | Status: DC
  Administered 2012-10-17 – 2012-10-25 (×9): 81 mg via ORAL
  Filled 2012-10-17 (×9): qty 1

## 2012-10-17 MED ORDER — INSULIN DETEMIR 100 UNIT/ML ~~LOC~~ SOLN
40.0000 [IU] | SUBCUTANEOUS | Status: DC
  Administered 2012-10-17 – 2012-10-25 (×9): 40 [IU] via SUBCUTANEOUS
  Filled 2012-10-17 (×2): qty 10

## 2012-10-17 MED ORDER — TIMOLOL MALEATE 0.5 % OP SOLN
1.0000 [drp] | Freq: Two times a day (BID) | OPHTHALMIC | Status: DC
  Administered 2012-10-17 – 2012-10-25 (×17): 1 [drp] via OPHTHALMIC
  Filled 2012-10-17: qty 5

## 2012-10-17 MED ORDER — BRIMONIDINE TARTRATE-TIMOLOL 0.2-0.5 % OP SOLN
1.0000 [drp] | Freq: Two times a day (BID) | OPHTHALMIC | Status: DC
Start: 2012-10-17 — End: 2012-10-17

## 2012-10-17 MED ORDER — LATANOPROST 0.005 % OP SOLN
1.0000 [drp] | Freq: Every day | OPHTHALMIC | Status: DC
  Administered 2012-10-17 – 2012-10-24 (×8): 1 [drp] via OPHTHALMIC
  Filled 2012-10-17: qty 2.5

## 2012-10-17 MED ORDER — HYDROCHLOROTHIAZIDE 25 MG PO TABS
25.0000 mg | ORAL_TABLET | Freq: Every day | ORAL | Status: DC
  Administered 2012-10-17 – 2012-10-21 (×5): 25 mg via ORAL
  Filled 2012-10-17 (×5): qty 1

## 2012-10-17 MED ORDER — VALSARTAN-HYDROCHLOROTHIAZIDE 320-25 MG PO TABS: 1.0000 | ORAL_TABLET | Freq: Every day | ORAL | Status: DC

## 2012-10-17 MED ORDER — WHITE PETROLATUM GEL
Status: AC
  Administered 2012-10-17: 16:00:00
  Filled 2012-10-17: qty 5

## 2012-10-17 MED ORDER — METHOCARBAMOL 100 MG/ML IJ SOLN
500.0000 mg | Freq: Three times a day (TID) | INTRAVENOUS | Status: DC | PRN
  Filled 2012-10-17: qty 5

## 2012-10-17 MED ORDER — BRIMONIDINE TARTRATE 0.2 % OP SOLN
1.0000 [drp] | Freq: Two times a day (BID) | OPHTHALMIC | Status: DC
  Administered 2012-10-17 – 2012-10-25 (×17): 1 [drp] via OPHTHALMIC
  Filled 2012-10-17: qty 5

## 2012-10-17 MED ORDER — IRBESARTAN 300 MG PO TABS
300.0000 mg | ORAL_TABLET | Freq: Every day | ORAL | Status: DC
  Administered 2012-10-17 – 2012-10-25 (×9): 300 mg via ORAL
  Filled 2012-10-17 (×9): qty 1

## 2012-10-17 MED ORDER — PANTOPRAZOLE SODIUM 40 MG PO TBEC
40.0000 mg | DELAYED_RELEASE_TABLET | Freq: Every day | ORAL | Status: DC
  Administered 2012-10-17 – 2012-10-25 (×9): 40 mg via ORAL
  Filled 2012-10-17: qty 1
  Filled 2012-10-17: qty 2
  Filled 2012-10-17 (×7): qty 1

## 2012-10-17 MED ORDER — FERROUS SULFATE 325 (65 FE) MG PO TABS
325.0000 mg | ORAL_TABLET | Freq: Two times a day (BID) | ORAL | Status: DC
  Administered 2012-10-17 – 2012-10-25 (×17): 325 mg via ORAL
  Filled 2012-10-17 (×18): qty 1

## 2012-10-17 MED ORDER — CLOPIDOGREL BISULFATE 75 MG PO TABS
75.0000 mg | ORAL_TABLET | Freq: Every day | ORAL | Status: DC
  Administered 2012-10-17 – 2012-10-25 (×9): 75 mg via ORAL
  Filled 2012-10-17 (×9): qty 1

## 2012-10-17 MED ORDER — FLAXSEED OIL 1000 MG PO CAPS: 1000.0000 mg | ORAL_CAPSULE | Freq: Every day | ORAL | Status: DC

## 2012-10-17 MED ORDER — VITAMIN B-12 100 MCG PO TABS
100.0000 ug | ORAL_TABLET | Freq: Every day | ORAL | Status: DC
  Administered 2012-10-17 – 2012-10-25 (×9): 100 ug via ORAL
  Filled 2012-10-17 (×9): qty 1

## 2012-10-17 MED ORDER — CALCIUM-VITAMIN D 600-400 MG-UNIT PO TABS: 600.0000 mg | ORAL_TABLET | Freq: Every morning | ORAL | Status: DC

## 2012-10-17 NOTE — ED Provider Notes (Signed)
Patient admitted to hospitalist service pending rehab placement.  12:34 AM Right eye examined.  Tetracaine instilled, fluorescein applied, examined with woods lamp.  No foreign bodies or corneal abrasion noted.  Eye irrigated with saline.  Appears to be a small subconjunctival hemorrhage at the medial canthus.  Norman Herrlich, NP 10/17/12 [phone]
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
## 2012-10-17 NOTE — Progress Notes (Signed)
PT Cancellation Note  Patient Details Name: [name] MRN: [id number] DOB: 1928/06/07   Cancelled Treatment:    Reason Eval/Treat Not Completed: Patient not medically ready. Pt still on bedrest, awaiting Neuro consult. Will attempt evaluation tomorrow pending medical stability.    Ambrose Finland 10/17/2012, 9:36 AM
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
## 2012-10-18 LAB — BASIC METABOLIC PANEL
CO2: 27 mEq/L (ref 19–32)
Chloride: 101 mEq/L (ref 96–112)
GFR calc Af Amer: 40 mL/min — ABNORMAL LOW (ref >90)
Sodium: 140 mEq/L (ref 135–145)

## 2012-10-18 LAB — CBC
Platelets: 224 10*3/uL (ref 150–400)
RBC: 3.47 MIL/uL — ABNORMAL LOW (ref 3.87–5.11)
RDW: 13 % (ref 11.5–15.5)
WBC: 6.7 10*3/uL (ref 4.0–10.5)

## 2012-10-18 LAB — GLUCOSE, CAPILLARY
Glucose-Capillary: 123 mg/dL — ABNORMAL HIGH (ref 70–99)
Glucose-Capillary: 186 mg/dL — ABNORMAL HIGH (ref 70–99)
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
## 2012-10-19 LAB — URINALYSIS, ROUTINE W REFLEX MICROSCOPIC
Bilirubin Urine: NEGATIVE
Glucose, UA: NEGATIVE mg/dL
Ketones, ur: NEGATIVE mg/dL
Nitrite: NEGATIVE
Protein, ur: NEGATIVE mg/dL
pH: 5 (ref 5.0–8.0)

## 2012-10-19 LAB — URINE MICROSCOPIC-ADD ON
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
## 2012-10-19 NOTE — Progress Notes (Signed)
Physical Therapy Treatment Patient Details Name: [name] MRN: [id number] DOB: Dec 18, 1927 Today's Date: 10/19/2012 Time: 1010-1037 PT Time Calculation (min): 27 min  PT Assessment / Plan / Recommendation Comments on Treatment Session  Pt progressing well but remains to require 24/7 assist with all mobilty. Pt with strong use of grab bars for transfer on/off commode. Pt remains to requires max directional cues for safe hand placement, cervical precautions, and transfer technique. Pt to con't to benefit from SNF placement upon d/c for maximium functional recovery prior ot transition home.    Follow Up Recommendations  SNF;Supervision/Assistance - 24 hour     Does the patient have the potential to tolerate intense rehabilitation     Barriers to Discharge        Equipment Recommendations  Rolling walker with 5" wheels (called advanced home care to switch out wheels)    Recommendations for Other Services    Frequency Min 3X/week   Plan Discharge plan remains appropriate;Frequency remains appropriate    Precautions / Restrictions Precautions Precautions: Fall;Cervical Precaution Comments: c-collar on at all times. Re-fitted pts c-collar in supine due to back piece on upside down. Required Braces or Orthoses: Cervical Brace Cervical Brace: Hard collar Restrictions Weight Bearing Restrictions: No   Pertinent Vitals/Pain Did not rate but remains to have buttock pain bilaterally    Mobility  Bed Mobility Bed Mobility: Rolling Left;Left Sidelying to Sit;Sit to Supine Rolling Left: 4: Min assist Left Sidelying to Sit: 4: Min assist;HOB flat Sit to Supine: 3: Mod assist;HOB flat Details for Bed Mobility Assistance: assist for LE elevation, v/c's for log roll technique Transfers Transfers: Sit to Stand;Stand to Sit Sit to Stand: 4: Min guard Stand to Sit: 4: Min guard Details for Transfer Assistance: v/c's for safe hand placement Ambulation/Gait Ambulation/Gait Assistance:  4: Min guard Ambulation Distance (Feet): 100 Feet Assistive device: Rolling walker Ambulation/Gait Assistance Details: v/c's to stay in walker Gait Pattern: Step-through pattern;Decreased stride length Gait velocity: guarded, cautious Stairs: No    Exercises     PT Diagnosis:    PT Problem List:   PT Treatment Interventions:     PT Goals Acute Rehab PT Goals PT Goal: Rolling Supine to Left Side - Progress: Progressing toward goal PT Goal: Supine/Side to Sit - Progress: Progressing toward goal PT Goal: Sit to Supine/Side - Progress: Progressing toward goal PT Goal: Sit to Stand - Progress: Progressing toward goal PT Goal: Ambulate - Progress: Progressing toward goal  Visit Information  Last PT Received On: 10/19/12 Assistance Needed: +1    Subjective Data  Subjective: Pt received supine in bed with back of c-collar on upside down agreeable to PT   Cognition  Overall Cognitive Status: Appears within functional limits for tasks assessed/performed Arousal/Alertness: Awake/alert Orientation Level: Oriented X4 / Intact Behavior During Session: [facility] for tasks performed    Balance     End of Session PT - End of Session Equipment Utilized During Treatment: Gait belt;Cervical collar Activity Tolerance: Patient tolerated treatment well Patient left: in chair;with call bell/phone within reach;with family/visitor present Nurse Communication: Mobility status   GP     Kingsley Callander 10/19/2012, 11:04 AM  Kittie Plater, PT, DPT Pager #: [phone] Office #: [phone]

## 2012-10-19 NOTE — Clinical Social Work Psychosocial (Deleted)
Clinical Social Work Department  BRIEF PSYCHOSOCIAL ASSESSMENT  Patient:[name] Account Number: [id number]  Admit date: 10/16/12  Clinical Social Worker: Rhea Pink Date/Time: 10/19/12  Referred by: Physician Date Referred: 10/19/12   Referred for   SNF Placement   Other Referral:  Interview type: Patient  Other interview type: Patient's daughter was present  PSYCHOSOCIAL DATA  Living Status: FAMILY  Admitted from facility:  Level of care:  Primary support name:Golden,Beverly  Primary support relationship to patient: Daughter  Degree of support available:   Strong and vested   CURRENT CONCERNS   Current Concerns   Post-Acute Placement   Other Concerns:  SOCIAL WORK ASSESSMENT / PLAN   CSW met with pt re: PT recommendation for SNF.   Pt lives with her daughter   CSW explained placement process and answered questions.     CSW completed FL2 and initiated [facility] search.   Weekday CSW to f/u with offers.   Assessment/plan status: Information/Referral to [employer]  Other assessment/ plan:  Information/referral to community resources:   SNF   PTAR   PATIENT'S/FAMILY'S RESPONSE TO PLAN OF CARE:   Pt And pt's daughter report they are agreeable to ST SNF in order to increase strength and independence with mobility prior to return home. Pt verbalized understanding of placement process and appreciation for CSW assist.   Rhea Pink, MSW  [phone]

## 2012-10-19 NOTE — Clinical Social Work Placement (Addendum)
Clinical Social Work Department  CLINICAL SOCIAL WORK PLACEMENT NOTE  10/19/2012  Patient: [name]  Account Number: [id number] Admit date: 10/16/2012  Clinical Social Worker: Rhea Pink, MSW Date/time: 10/19/2012 5:30 AM  Clinical Social Work is seeking post-discharge placement for this patient at the following level of care: [place] (*CSW will update this form in Epic as items are completed)  10/19/2012 Patient/family provided with [place] Department of Clinical Social Work's list of facilities offering this level of care within the geographic area requested by the patient (or if unable, by the patient's family).  10/19/2012 Patient/family informed of their freedom to choose among providers that offer the needed level of care, that participate in Medicare, Medicaid or managed care program needed by the patient, have an available bed and are willing to accept the patient.  10/19/2012 Patient/family informed of [facility]' ownership interest in [facility], as well as of the fact that they are under no obligation to receive care at this facility.  PASARR submitted to EDS on 10/22/2012 PASARR number received from EDS on 10/22/2012  FL2 transmitted to all facilities in geographic area requested by pt/family on12/31/2013  FL2 transmitted to all facilities within larger geographic area on  Patient informed that his/her managed care company has contracts with or will negotiate with certain facilities, including the following:  Patient/family informed of bed offers received: 10/23/2011 Patient chooses bed at [facility] Physician recommends and patient chooses bed at [facility] Patient to be transferred to on  [facility] Patient to be transferred to facility by  The following physician request were entered in Epic:  Additional Comments:  Rhea Pink, MSW,  [phone]

## 2012-10-19 NOTE — Clinical Social Work Psychosocial (Signed)
Clinical Social Work Department  BRIEF PSYCHOSOCIAL ASSESSMENT  Patient:[name]  Account Number: [id number]  Admit date: 10/16/12  Clinical Social Worker: Rhea Pink Date/Time: 10/19/12  Referred by: Physician Date Referred: 10/19/12  Referred for  SNF Placement Other Referral:  Interview type: Patient  Other interview type: Patient's daughter was present  PSYCHOSOCIAL DATA  Living Status: FAMILY  Admitted from facility:  Level of care:  Primary support name:[name],[name]  Primary support relationship to patient: Daughter  Degree of support available:  Strong and vested  CURRENT CONCERNS  Current Concerns  Post-Acute Placement Other Concerns:  SOCIAL WORK ASSESSMENT / PLAN  CSW met with pt re: PT recommendation for SNF.  Pt lives with her daughter  CSW explained placement process and answered questions.  CSW completed FL2 and initiated [facility] search.  Weekday CSW to f/u with offers. Assessment/plan status: Information/Referral to [employer]  Other assessment/ plan:  Information/referral to community resources:  SNF  PTAR  PATIENT'S/FAMILY'S RESPONSE TO PLAN OF CARE:  Pt And pt's daughter report they are agreeable to ST SNF in order to increase strength and independence with mobility prior to return home. Pt verbalized understanding of placement process and appreciation for CSW assist. Rhea Pink, MSW

## 2012-10-20 LAB — CBC
MCHC: 32.4 g/dL (ref 30.0–36.0)
MCV: 86.9 fL (ref 78.0–100.0)
Platelets: 259 10*3/uL (ref 150–400)
RDW: 12.8 % (ref 11.5–15.5)
WBC: 5.8 10*3/uL (ref 4.0–10.5)

## 2012-10-20 LAB — BASIC METABOLIC PANEL
BUN: 28 mg/dL — ABNORMAL HIGH (ref 6–23)
CO2: 27 mEq/L (ref 19–32)
Calcium: 9.6 mg/dL (ref 8.4–10.5)
Creatinine, Ser: 2.1 mg/dL — ABNORMAL HIGH (ref 0.50–1.10)
GFR calc Af Amer: 24 mL/min — ABNORMAL LOW (ref >90)

## 2012-10-20 LAB — GLUCOSE, CAPILLARY
Glucose-Capillary: 208 mg/dL — ABNORMAL HIGH (ref 70–99)
Glucose-Capillary: 123 mg/dL — ABNORMAL HIGH (ref 70–99)

## 2012-10-20 LAB — URINE CULTURE: Colony Count: 60000
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
## 2012-10-21 LAB — GLUCOSE, CAPILLARY
Glucose-Capillary: 100 mg/dL — ABNORMAL HIGH (ref 70–99)
Glucose-Capillary: 176 mg/dL — ABNORMAL HIGH (ref 70–99)
Glucose-Capillary: 166 mg/dL — ABNORMAL HIGH (ref 70–99)
Glucose-Capillary: 145 mg/dL — ABNORMAL HIGH (ref 70–99)

## 2012-10-21 MED ORDER — INSULIN ASPART 100 UNIT/ML ~~LOC~~ SOLN: 0.0000 [IU] | Freq: Every day | SUBCUTANEOUS | Status: DC

## 2012-10-21 MED ORDER — SODIUM CHLORIDE 0.9 % IV SOLN
INTRAVENOUS | Status: DC
  Administered 2012-10-21: 12:00:00 via INTRAVENOUS
  Administered 2012-10-23 – 2012-10-24 (×3): 1000 mL via INTRAVENOUS

## 2012-10-21 MED ORDER — INSULIN ASPART 100 UNIT/ML ~~LOC~~ SOLN
0.0000 [IU] | Freq: Three times a day (TID) | SUBCUTANEOUS | Status: DC
  Administered 2012-10-21 – 2012-10-22 (×4): 3 [IU] via SUBCUTANEOUS
  Administered 2012-10-23: 5 [IU] via SUBCUTANEOUS
  Administered 2012-10-23: 3 [IU] via SUBCUTANEOUS

## 2012-10-21 NOTE — Progress Notes (Signed)
Inpatient Diabetes Program Recommendations  AACE/ADA: New Consensus Statement on Inpatient Glycemic Control (2013)  Target Ranges:  Prepandial:   less than 140 mg/dL      Peak postprandial:   less than 180 mg/dL (1-2 hours)      Critically ill patients:  140 - 180 mg/dL   Results for [name], [name] (MRN [id number]) as of 10/21/2012 10:29  Ref. Range 10/20/2012 06:40 10/20/2012 11:32 10/20/2012 16:08 10/20/2012 22:12 10/21/2012 06:47  Glucose-Capillary Latest Range: 70-99 mg/dL 117 (H) 290 (H) 201 (H) 123 (H) 100 (H)    Inpatient Diabetes Program Recommendations Correction (SSI): Please consider increasing Novolog correction to moderate scale.  Note: Fasting blood glucose for the past two days was 117 mg/dl and 100 mg/dl. However, blood glucose at noon and supper have been more elevated.  Please consider increasing Novolog correction to moderate scale.  Will continue to follow.  Thanks, Barnie Alderman, RN, BSN, [place] [facility] Coordinator Inpatient Diabetes Program [phone]
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
## 2012-10-21 NOTE — Progress Notes (Signed)
Physical Therapy Treatment Patient Details Name: [name] MRN: [id number] DOB: [birth year] Today's Date: 10/21/2012 Time: 9211-9417 PT Time Calculation (min): 15 min  PT Assessment / Plan / Recommendation Comments on Treatment Session  Patient progressing well and motivated however still requires 24/7 assist with all mobility. Requires safety cues with transfers and with RW for ambulation    Follow Up Recommendations  SNF;Supervision/Assistance - 24 hour     Does the patient have the potential to tolerate intense rehabilitation     Barriers to Discharge        Equipment Recommendations  Rolling walker with 5" wheels    Recommendations for Other Services    Frequency Min 3X/week   Plan Discharge plan remains appropriate;Frequency remains appropriate    Precautions / Restrictions Precautions Precautions: Fall;Cervical Precaution Comments: Reviewed cervical precuations before and after treatment session Required Braces or Orthoses: Cervical Brace Cervical Brace: Hard collar   Pertinent Vitals/Pain     Mobility  Bed Mobility Rolling Left: 4: Min assist;With rail Left Sidelying to Sit: 4: Min assist;With rails Details for Bed Mobility Assistance: A for support into upright sitting Transfers Sit to Stand: 4: Min guard;With upper extremity assist;From bed Stand to Sit: 4: Min guard;With upper extremity assist;To chair/3-in-1 Details for Transfer Assistance: Cues for safe hand placement and not to pull up with RW. Ambulation/Gait Ambulation/Gait Assistance: 4: Min guard Ambulation Distance (Feet): 150 Feet Assistive device: Rolling walker Ambulation/Gait Assistance Details: Cues for safe management of RW. Patient doing a better job of staying closer into Rw Gait Pattern: Step-through pattern;Decreased stride length    Exercises     PT Diagnosis:    PT Problem List:   PT Treatment Interventions:     PT Goals Acute Rehab PT Goals PT Goal: Rolling Supine to Left  Side - Progress: Progressing toward goal PT Goal: Supine/Side to Sit - Progress: Progressing toward goal PT Goal: Sit to Stand - Progress: Progressing toward goal PT Goal: Ambulate - Progress: Progressing toward goal  Visit Information  Last PT Received On: 10/21/12 Assistance Needed: +1    Subjective Data      Cognition  Overall Cognitive Status: Appears within functional limits for tasks assessed/performed Arousal/Alertness: Awake/alert Orientation Level: Appears intact for tasks assessed Behavior During Session: [facility] for tasks performed    Balance     End of Session PT - End of Session Equipment Utilized During Treatment: Gait belt;Cervical collar Activity Tolerance: Patient tolerated treatment well Patient left: in chair;with call bell/phone within reach Nurse Communication: Mobility status   GP     Jacqualyn Posey 10/21/2012, 1:52 PM 10/21/2012 Jacqualyn Posey PTA [phone] pager [phone] office

## 2012-10-22 LAB — BASIC METABOLIC PANEL
CO2: 28 mEq/L (ref 19–32)
Calcium: 10 mg/dL (ref 8.4–10.5)
Creatinine, Ser: 1.31 mg/dL — ABNORMAL HIGH (ref 0.50–1.10)
GFR calc non Af Amer: 37 mL/min — ABNORMAL LOW (ref >90)
Glucose, Bld: 218 mg/dL — ABNORMAL HIGH (ref 70–99)
Sodium: 138 mEq/L (ref 135–145)

## 2012-10-22 LAB — GLUCOSE, CAPILLARY
Glucose-Capillary: 101 mg/dL — ABNORMAL HIGH (ref 70–99)
Glucose-Capillary: 190 mg/dL — ABNORMAL HIGH (ref 70–99)

## 2012-10-22 MED ORDER — DSS 100 MG PO CAPS: 100.0000 mg | ORAL_CAPSULE | Freq: Two times a day (BID) | ORAL | Status: DC

## 2012-10-22 MED ORDER — ERYTHROMYCIN 5 MG/GM OP OINT: TOPICAL_OINTMENT | Freq: Two times a day (BID) | OPHTHALMIC | Status: AC

## 2012-10-22 MED ORDER — POLYETHYLENE GLYCOL 3350 17 G PO PACK
17.0000 g | PACK | Freq: Every day | ORAL | Status: DC
  Administered 2012-10-23 – 2012-10-25 (×2): 17 g via ORAL
  Filled 2012-10-22 (×4): qty 1

## 2012-10-22 MED ORDER — DOCUSATE SODIUM 100 MG PO CAPS
100.0000 mg | ORAL_CAPSULE | Freq: Two times a day (BID) | ORAL | Status: DC
  Administered 2012-10-22 – 2012-10-25 (×6): 100 mg via ORAL
  Filled 2012-10-22 (×6): qty 1

## 2012-10-22 MED ORDER — POLYETHYLENE GLYCOL 3350 17 G PO PACK: 17.0000 g | PACK | Freq: Every day | ORAL | Status: DC

## 2012-10-22 MED ORDER — OXYCODONE HCL 5 MG PO TABS: 5.0000 mg | ORAL_TABLET | ORAL | Status: DC | PRN

## 2012-10-22 NOTE — Clinical Social Work Note (Signed)
Clinical Social Work   CSW met with pt and family. Pt chose [facility]. CSW confirmed with facility bed availability. They are verifying insurance. MD is aware. Family is aware. CSW will continue to follow to facilitate discharge.   Darden Dates, MSW, [place]
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
## 2012-10-22 NOTE — Progress Notes (Signed)
Physical Therapy Treatment Patient Details Name: [name] MRN: [id number] DOB: [birth year] Today's Date: 10/22/2012 Time: 7782-4235 PT Time Calculation (min): 26 min  PT Assessment / Plan / Recommendation Comments on Treatment Session  Pt moving well this session, supervision to minguard for all mobility. Pt still requiring cueing for safe technique throughout transfer. Plan to d/c to SNF today or tomorrow    Follow Up Recommendations  SNF;Supervision/Assistance - 24 hour     Does the patient have the potential to tolerate intense rehabilitation     Barriers to Discharge        Equipment Recommendations  Rolling walker with 5" wheels    Recommendations for Other Services    Frequency Min 3X/week   Plan Discharge plan remains appropriate;Frequency remains appropriate    Precautions / Restrictions Precautions Precautions: Fall;Cervical Precaution Comments: Pt able to verbalize cervical precautions Required Braces or Orthoses: Cervical Brace Cervical Brace: Hard collar Restrictions Weight Bearing Restrictions: No   Pertinent Vitals/Pain No complaints of pain.     Mobility  Bed Mobility Bed Mobility: Rolling Left;Left Sidelying to Sit;Sit to Supine Rolling Left: 5: Supervision Left Sidelying to Sit: 5: Supervision Details for Bed Mobility Assistance: Supervision for safety. Cues for safe technique Transfers Transfers: Sit to Stand;Stand to Sit Sit to Stand: 4: Min guard;With upper extremity assist;From bed;From toilet Stand to Sit: 4: Min guard;With upper extremity assist;To chair/3-in-1;To bed;To toilet Details for Transfer Assistance: Minguard for safety. Cues for hand placement to/from RW Ambulation/Gait Ambulation/Gait Assistance: 5: Supervision Ambulation Distance (Feet): 150 Feet Assistive device: Rolling walker Ambulation/Gait Assistance Details: VC for safe distance to RW as pt leans forward in flexion. More fluid gait pattern Gait Pattern: Step-through  pattern;Decreased stride length    Exercises     PT Diagnosis:    PT Problem List:   PT Treatment Interventions:     PT Goals Acute Rehab PT Goals PT Goal: Rolling Supine to Left Side - Progress: Progressing toward goal PT Goal: Supine/Side to Sit - Progress: Progressing toward goal PT Goal: Sit to Stand - Progress: Progressing toward goal PT Goal: Ambulate - Progress: Met  Visit Information  Last PT Received On: 10/22/12 Assistance Needed: +1    Subjective Data      Cognition  Overall Cognitive Status: Appears within functional limits for tasks assessed/performed Arousal/Alertness: Awake/alert Orientation Level: Appears intact for tasks assessed Behavior During Session: [facility] for tasks performed    Balance     End of Session PT - End of Session Equipment Utilized During Treatment: Gait belt;Cervical collar Activity Tolerance: Patient tolerated treatment well Patient left: in chair;with call bell/phone within reach;with family/visitor present Nurse Communication: Mobility status   GP     Ambrose Finland 10/22/2012, 5:29 PM
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
## 2012-10-23 LAB — GLUCOSE, CAPILLARY
Glucose-Capillary: 100 mg/dL — ABNORMAL HIGH (ref 70–99)
Glucose-Capillary: 181 mg/dL — ABNORMAL HIGH (ref 70–99)

## 2012-10-23 NOTE — Progress Notes (Signed)
Patient accepted at [facility] facility. Insurance approval pending. Creatinine rechecked and found to be down to 1.3. Stable for discharge.

## 2012-10-24 LAB — GLUCOSE, CAPILLARY
Glucose-Capillary: 154 mg/dL — ABNORMAL HIGH (ref 70–99)
Glucose-Capillary: 107 mg/dL — ABNORMAL HIGH (ref 70–99)

## 2012-10-24 NOTE — Progress Notes (Signed)
Patient doing okay. No acute distress. CBG slightly elevated yesterday but better today. Pressure stable. For skilled nursing facility tomorrow morning.

## 2012-10-25 ENCOUNTER — Ambulatory Visit: Payer: Medicare Other | Admitting: Endocrinology

## 2012-10-25 LAB — GLUCOSE, CAPILLARY
Glucose-Capillary: 90 mg/dL (ref 70–99)
Glucose-Capillary: 107 mg/dL — ABNORMAL HIGH (ref 70–99)

## 2012-10-25 LAB — BASIC METABOLIC PANEL
CO2: 27 mEq/L (ref 19–32)
Calcium: 10.1 mg/dL (ref 8.4–10.5)
Chloride: 101 mEq/L (ref 96–112)
Glucose, Bld: 121 mg/dL — ABNORMAL HIGH (ref 70–99)
Sodium: 138 mEq/L (ref 135–145)

## 2012-10-25 LAB — TROPONIN I: Troponin I: <0.3 ng/mL (ref <0.30)

## 2012-10-25 MED ORDER — OXYCODONE HCL 5 MG PO TABS: 5.0000 mg | ORAL_TABLET | ORAL | Status: DC | PRN

## 2012-10-25 MED ORDER — OXYCODONE-ACETAMINOPHEN 5-325 MG PO TABS: 2.0000 | ORAL_TABLET | ORAL | Status: DC | PRN
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
## 2012-10-25 NOTE — Clinical Social Work Note (Signed)
Clinical Social Work   Pt is ready for discharge to [facility]. Facility has received discharge summary and is ready to accept pt. Pt and family are agreeable to discharge plan. [facility] will provide transportation. CSW is signing off as no further needs identified.   Darden Dates, MSW, [place]
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
## 2012-10-25 NOTE — Progress Notes (Signed)
Physical Therapy Treatment Patient Details Name: SADE HOLLON MRN: [id number] DOB: Dec 22, 1927 Today's Date: 10/25/2012 Time: 1206-1221 PT Time Calculation (min): 15 min  PT Assessment / Plan / Recommendation Comments on Treatment Session  Pt con't to remain motivated and is progressing well with all mobility. pt remains to require some assist  with IADLs and transfer in/out of bed per  family. Pt to con't to benefit from SNF upon d/c due to patient alone during the day.    Follow Up Recommendations  SNF;Supervision/Assistance - 24 hour     Does the patient have the potential to tolerate intense rehabilitation     Barriers to Discharge        Equipment Recommendations  Rolling walker with 5" wheels    Recommendations for Other Services    Frequency Min 3X/week   Plan Discharge plan remains appropriate;Frequency remains appropriate    Precautions / Restrictions Precautions Precautions: Fall;Cervical Precaution Comments: Pt able to verbalize cervical precautions Required Braces or Orthoses: Cervical Brace Cervical Brace: Hard collar Restrictions Weight Bearing Restrictions: No   Pertinent Vitals/Pain Denies L flank pain with ambulation    Mobility  Transfers Transfers: Sit to Stand;Stand to Sit Sit to Stand: 4: Min guard;With upper extremity assist;From bed;From toilet Stand to Sit: 4: Min guard;With upper extremity assist;To chair/3-in-1;To bed;To toilet Ambulation/Gait Ambulation/Gait Assistance: 4: Min guard Ambulation Distance (Feet): 200 Feet Assistive device: Rolling walker Ambulation/Gait Assistance Details: pt remains guarded, but no LOB, improved walker management Gait Pattern: Step-through pattern;Decreased stride length Gait velocity: guarded, cautious Stairs: No    Exercises   re-educated patient family on cervical precautions and not using pillows   PT Diagnosis:    PT Problem List:   PT Treatment Interventions:     PT Goals Acute Rehab PT  Goals Time For Goal Achievement: 11/01/12 Potential to Achieve Goals: Good PT Goal: Sit to Stand - Progress: Progressing toward goal PT Goal: Ambulate - Progress: Progressing toward goal  Visit Information  Last PT Received On: 10/25/12 Assistance Needed: +1    Subjective Data  Subjective: Pt received sitting up in chair with report of L flank pain but did not rate.   Cognition  Overall Cognitive Status: Appears within functional limits for tasks assessed/performed Arousal/Alertness: Awake/alert Orientation Level: Appears intact for tasks assessed Behavior During Session: [facility] for tasks performed    Balance     End of Session PT - End of Session Equipment Utilized During Treatment: Gait belt;Cervical collar Activity Tolerance: Patient tolerated treatment well Patient left: in chair;with call bell/phone within reach;with family/visitor present Nurse Communication: Mobility status   GP     Kingsley Callander 10/25/2012, 12:44 PM  Kittie Plater, PT, DPT Pager #: [phone] Office #: [phone]

## 2012-11-02 ENCOUNTER — Ambulatory Visit: Payer: Medicare Other | Admitting: Physical Therapy

## 2012-11-15 ENCOUNTER — Ambulatory Visit: Payer: Medicare Other | Attending: Cardiology

## 2012-11-15 DIAGNOSIS — R5381 Other malaise: Secondary | ICD-10-CM | POA: Insufficient documentation

## 2012-11-15 DIAGNOSIS — IMO0001 Reserved for inherently not codable concepts without codable children: Secondary | ICD-10-CM | POA: Insufficient documentation

## 2012-11-15 DIAGNOSIS — M542 Cervicalgia: Secondary | ICD-10-CM | POA: Insufficient documentation

## 2012-11-15 DIAGNOSIS — R269 Unspecified abnormalities of gait and mobility: Secondary | ICD-10-CM | POA: Insufficient documentation

## 2012-11-15 DIAGNOSIS — R293 Abnormal posture: Secondary | ICD-10-CM | POA: Insufficient documentation

## 2012-11-18 ENCOUNTER — Ambulatory Visit: Payer: Medicare Other | Admitting: Physical Therapy

## 2012-11-18 ENCOUNTER — Ambulatory Visit: Payer: Medicare Other | Admitting: Rehabilitative and Restorative Service Providers"

## 2012-11-23 ENCOUNTER — Ambulatory Visit: Payer: No Typology Code available for payment source | Attending: Cardiology | Admitting: Physical Therapy

## 2012-11-23 ENCOUNTER — Encounter: Payer: Medicare Other | Admitting: Rehabilitative and Restorative Service Providers"

## 2012-11-23 DIAGNOSIS — M542 Cervicalgia: Secondary | ICD-10-CM | POA: Insufficient documentation

## 2012-11-23 DIAGNOSIS — R269 Unspecified abnormalities of gait and mobility: Secondary | ICD-10-CM | POA: Insufficient documentation

## 2012-11-23 DIAGNOSIS — R293 Abnormal posture: Secondary | ICD-10-CM | POA: Insufficient documentation

## 2012-11-23 DIAGNOSIS — R5381 Other malaise: Secondary | ICD-10-CM | POA: Insufficient documentation

## 2012-11-23 DIAGNOSIS — IMO0001 Reserved for inherently not codable concepts without codable children: Secondary | ICD-10-CM | POA: Insufficient documentation

## 2012-11-25 ENCOUNTER — Encounter: Payer: Medicare Other | Admitting: Rehabilitative and Restorative Service Providers"

## 2012-11-25 ENCOUNTER — Ambulatory Visit: Payer: No Typology Code available for payment source | Admitting: Physical Therapy

## 2012-11-28 ENCOUNTER — Inpatient Hospital Stay (HOSPITAL_COMMUNITY)
Admission: EM | Admit: 2012-11-28 | Discharge: 2012-12-01 | DRG: 243 | Disposition: A | Discharge status: Home / Self Care | Payer: Medicare Other | Attending: Cardiology | Admitting: Cardiology

## 2012-11-28 ENCOUNTER — Emergency Department (HOSPITAL_COMMUNITY): Payer: Medicare Other

## 2012-11-28 ENCOUNTER — Encounter (HOSPITAL_COMMUNITY)
Admission: EM | Disposition: A | Discharge status: Home / Self Care | Payer: Self-pay | Source: Home / Self Care | Attending: Cardiology

## 2012-11-28 ENCOUNTER — Ambulatory Visit (HOSPITAL_COMMUNITY): Admit: 2012-11-28 | Payer: Self-pay | Admitting: Cardiology

## 2012-11-28 ENCOUNTER — Encounter (HOSPITAL_COMMUNITY): Payer: Self-pay | Admitting: Emergency Medicine

## 2012-11-28 DIAGNOSIS — E119 Type 2 diabetes mellitus without complications: Secondary | ICD-10-CM | POA: Diagnosis present

## 2012-11-28 DIAGNOSIS — Z833 Family history of diabetes mellitus: Secondary | ICD-10-CM

## 2012-11-28 DIAGNOSIS — I129 Hypertensive chronic kidney disease with stage 1 through stage 4 chronic kidney disease, or unspecified chronic kidney disease: Secondary | ICD-10-CM | POA: Diagnosis present

## 2012-11-28 DIAGNOSIS — Z23 Encounter for immunization: Secondary | ICD-10-CM

## 2012-11-28 DIAGNOSIS — K219 Gastro-esophageal reflux disease without esophagitis: Secondary | ICD-10-CM | POA: Diagnosis present

## 2012-11-28 DIAGNOSIS — E662 Morbid (severe) obesity with alveolar hypoventilation: Secondary | ICD-10-CM | POA: Diagnosis present

## 2012-11-28 DIAGNOSIS — I2510 Atherosclerotic heart disease of native coronary artery without angina pectoris: Secondary | ICD-10-CM | POA: Diagnosis present

## 2012-11-28 DIAGNOSIS — IMO0002 Reserved for concepts with insufficient information to code with codable children: Secondary | ICD-10-CM

## 2012-11-28 DIAGNOSIS — I10 Essential (primary) hypertension: Secondary | ICD-10-CM | POA: Diagnosis present

## 2012-11-28 DIAGNOSIS — N182 Chronic kidney disease, stage 2 (mild): Secondary | ICD-10-CM | POA: Diagnosis present

## 2012-11-28 DIAGNOSIS — Z87891 Personal history of nicotine dependence: Secondary | ICD-10-CM

## 2012-11-28 DIAGNOSIS — I459 Conduction disorder, unspecified: Secondary | ICD-10-CM

## 2012-11-28 DIAGNOSIS — H409 Unspecified glaucoma: Secondary | ICD-10-CM | POA: Diagnosis present

## 2012-11-28 DIAGNOSIS — I2589 Other forms of chronic ischemic heart disease: Secondary | ICD-10-CM | POA: Diagnosis present

## 2012-11-28 DIAGNOSIS — Z8249 Family history of ischemic heart disease and other diseases of the circulatory system: Secondary | ICD-10-CM

## 2012-11-28 DIAGNOSIS — I498 Other specified cardiac arrhythmias: Secondary | ICD-10-CM | POA: Diagnosis present

## 2012-11-28 DIAGNOSIS — Z951 Presence of aortocoronary bypass graft: Secondary | ICD-10-CM

## 2012-11-28 DIAGNOSIS — Z8673 Personal history of transient ischemic attack (TIA), and cerebral infarction without residual deficits: Secondary | ICD-10-CM

## 2012-11-28 DIAGNOSIS — E7800 Pure hypercholesterolemia, unspecified: Secondary | ICD-10-CM | POA: Diagnosis present

## 2012-11-28 DIAGNOSIS — G4733 Obstructive sleep apnea (adult) (pediatric): Secondary | ICD-10-CM | POA: Diagnosis present

## 2012-11-28 DIAGNOSIS — I252 Old myocardial infarction: Secondary | ICD-10-CM

## 2012-11-28 DIAGNOSIS — Z794 Long term (current) use of insulin: Secondary | ICD-10-CM

## 2012-11-28 DIAGNOSIS — S129XXA Fracture of neck, unspecified, initial encounter: Secondary | ICD-10-CM

## 2012-11-28 DIAGNOSIS — Z7902 Long term (current) use of antithrombotics/antiplatelets: Secondary | ICD-10-CM

## 2012-11-28 DIAGNOSIS — I442 Atrioventricular block, complete: Principal | ICD-10-CM | POA: Diagnosis present

## 2012-11-28 DIAGNOSIS — Z7982 Long term (current) use of aspirin: Secondary | ICD-10-CM

## 2012-11-28 DIAGNOSIS — D6489 Other specified anemias: Secondary | ICD-10-CM | POA: Diagnosis present

## 2012-11-28 DIAGNOSIS — Z6831 Body mass index (BMI) 31.0-31.9, adult: Secondary | ICD-10-CM

## 2012-11-28 DIAGNOSIS — Z9861 Coronary angioplasty status: Secondary | ICD-10-CM

## 2012-11-28 DIAGNOSIS — Z79899 Other long term (current) drug therapy: Secondary | ICD-10-CM

## 2012-11-28 HISTORY — PX: TEMPORARY PACEMAKER INSERTION: SHX5471

## 2012-11-28 LAB — CBC WITH DIFFERENTIAL/PLATELET
Basophils Absolute: 0 K/uL (ref 0.0–0.1)
Basophils Relative: 0 % (ref 0–1)
Eosinophils Absolute: 0.1 10*3/uL (ref 0.0–0.7)
Eosinophils Absolute: 0.1 K/uL (ref 0.0–0.7)
Eosinophils Relative: 1 % (ref 0–5)
HCT: 38.3 % (ref 36.0–46.0)
Hemoglobin: 11.3 g/dL — ABNORMAL LOW (ref 12.0–15.0)
Hemoglobin: 12.4 g/dL (ref 12.0–15.0)
Lymphocytes Relative: 43 % (ref 12–46)
Lymphocytes Relative: 45 % (ref 12–46)
Lymphs Abs: 2.9 10*3/uL (ref 0.7–4.0)
Lymphs Abs: 2.5 K/uL (ref 0.7–4.0)
MCH: 28.1 pg (ref 26.0–34.0)
MCHC: 32.4 g/dL (ref 30.0–36.0)
MCV: 86.8 fL (ref 78.0–100.0)
Monocytes Absolute: 0.5 K/uL (ref 0.1–1.0)
Monocytes Relative: 10 % (ref 3–12)
Monocytes Relative: 10 % (ref 3–12)
Neutro Abs: 3.1 10*3/uL (ref 1.7–7.7)
Neutro Abs: 2.4 K/uL (ref 1.7–7.7)
Neutrophils Relative %: 46 % (ref 43–77)
Neutrophils Relative %: 44 % (ref 43–77)
Platelets: 232 10*3/uL (ref 150–400)
Platelets: 121 K/uL — ABNORMAL LOW (ref 150–400)
RBC: 3.99 MIL/uL (ref 3.87–5.11)
RBC: 4.41 MIL/uL (ref 3.87–5.11)
RDW: 13.2 % (ref 11.5–15.5)
WBC: 6.7 10*3/uL (ref 4.0–10.5)
WBC: 5.6 K/uL (ref 4.0–10.5)

## 2012-11-28 LAB — COMPREHENSIVE METABOLIC PANEL
Albumin: 3.3 g/dL — ABNORMAL LOW (ref 3.5–5.2)
BUN: 20 mg/dL (ref 6–23)
Calcium: 10.3 mg/dL (ref 8.4–10.5)
Creatinine, Ser: 1.2 mg/dL — ABNORMAL HIGH (ref 0.50–1.10)
GFR calc Af Amer: 47 mL/min — ABNORMAL LOW (ref >90)
Glucose, Bld: 127 mg/dL — ABNORMAL HIGH (ref 70–99)
Potassium: 4.1 mEq/L (ref 3.5–5.1)
Total Protein: 7.2 g/dL (ref 6.0–8.3)

## 2012-11-28 LAB — APTT: aPTT: 34 seconds (ref 24–37)

## 2012-11-28 LAB — MRSA PCR SCREENING: MRSA by PCR: NEGATIVE

## 2012-11-28 LAB — BASIC METABOLIC PANEL
BUN: 20 mg/dL (ref 6–23)
CO2: 27 mEq/L (ref 19–32)
Chloride: 104 mEq/L (ref 96–112)
GFR calc non Af Amer: 35 mL/min — ABNORMAL LOW (ref >90)
Glucose, Bld: 147 mg/dL — ABNORMAL HIGH (ref 70–99)
Potassium: 3.8 mEq/L (ref 3.5–5.1)
Sodium: 141 mEq/L (ref 135–145)

## 2012-11-28 LAB — POCT I-STAT TROPONIN I

## 2012-11-28 LAB — PROTIME-INR
INR: 0.96 (ref 0.00–1.49)
Prothrombin Time: 12.7 s (ref 11.6–15.2)

## 2012-11-28 LAB — MAGNESIUM: Magnesium: 2.1 mg/dL (ref 1.5–2.5)

## 2012-11-28 SURGERY — TEMPORARY PACEMAKER INSERTION
Anesthesia: LOCAL

## 2012-11-28 MED ORDER — SODIUM CHLORIDE 0.9 % IV SOLN: 250.0000 mL | INTRAVENOUS | Status: DC | PRN

## 2012-11-28 MED ORDER — ONDANSETRON HCL 4 MG/2ML IJ SOLN: 4.0000 mg | Freq: Four times a day (QID) | INTRAMUSCULAR | Status: DC | PRN

## 2012-11-28 MED ORDER — LIDOCAINE HCL (PF) 1 % IJ SOLN
INTRAMUSCULAR | Status: AC
  Filled 2012-11-28: qty 30

## 2012-11-28 MED ORDER — ACETAMINOPHEN 325 MG PO TABS: 650.0000 mg | ORAL_TABLET | ORAL | Status: DC | PRN

## 2012-11-28 MED ORDER — ATORVASTATIN CALCIUM 80 MG PO TABS
80.0000 mg | ORAL_TABLET | Freq: Every day | ORAL | Status: DC
  Administered 2012-11-29 – 2012-12-01 (×3): 80 mg via ORAL
  Filled 2012-11-28 (×3): qty 1

## 2012-11-28 MED ORDER — CLOPIDOGREL BISULFATE 75 MG PO TABS
75.0000 mg | ORAL_TABLET | Freq: Every day | ORAL | Status: DC
  Administered 2012-11-29 – 2012-12-01 (×3): 75 mg via ORAL
  Filled 2012-11-28 (×4): qty 1

## 2012-11-28 MED ORDER — HEPARIN (PORCINE) IN NACL 2-0.9 UNIT/ML-% IJ SOLN
INTRAMUSCULAR | Status: AC
  Filled 2012-11-28: qty 500

## 2012-11-28 MED ORDER — NITROGLYCERIN 0.4 MG SL SUBL: 0.4000 mg | SUBLINGUAL_TABLET | SUBLINGUAL | Status: DC | PRN

## 2012-11-28 MED ORDER — AMLODIPINE BESYLATE 5 MG PO TABS
5.0000 mg | ORAL_TABLET | Freq: Every day | ORAL | Status: DC
  Administered 2012-11-29 – 2012-12-01 (×3): 5 mg via ORAL
  Filled 2012-11-28 (×3): qty 1

## 2012-11-28 MED ORDER — SODIUM CHLORIDE 0.9 % IJ SOLN
3.0000 mL | Freq: Two times a day (BID) | INTRAMUSCULAR | Status: DC
  Administered 2012-11-28: 3 mL via INTRAVENOUS

## 2012-11-28 MED ORDER — SODIUM CHLORIDE 0.9 % IJ SOLN: 3.0000 mL | INTRAMUSCULAR | Status: DC | PRN

## 2012-11-28 MED ORDER — SODIUM CHLORIDE 0.9 % IV SOLN: INTRAVENOUS | Status: DC

## 2012-11-28 MED ORDER — ASPIRIN EC 81 MG PO TBEC
81.0000 mg | DELAYED_RELEASE_TABLET | Freq: Every day | ORAL | Status: DC
  Administered 2012-11-30 – 2012-12-01 (×2): 81 mg via ORAL
  Filled 2012-11-28 (×2): qty 1

## 2012-11-28 MED ORDER — PNEUMOCOCCAL VAC POLYVALENT 25 MCG/0.5ML IJ INJ
0.5000 mL | INJECTION | INTRAMUSCULAR | Status: AC
  Filled 2012-11-28: qty 0.5

## 2012-11-28 MED ORDER — ASPIRIN 81 MG PO CHEW
324.0000 mg | CHEWABLE_TABLET | ORAL | Status: AC
  Administered 2012-11-29: 324 mg via ORAL
  Filled 2012-11-28 (×2): qty 4

## 2012-11-28 MED ORDER — ACETAMINOPHEN 325 MG PO TABS
650.0000 mg | ORAL_TABLET | ORAL | Status: DC | PRN
  Administered 2012-11-29: 650 mg via ORAL
  Filled 2012-11-28: qty 2

## 2012-11-28 MED ORDER — INSULIN ASPART 100 UNIT/ML ~~LOC~~ SOLN
0.0000 [IU] | Freq: Three times a day (TID) | SUBCUTANEOUS | Status: DC
  Administered 2012-11-29 (×2): 1 [IU] via SUBCUTANEOUS
  Administered 2012-11-30: 2 [IU] via SUBCUTANEOUS
  Administered 2012-11-30: 08:00:00 via SUBCUTANEOUS
  Administered 2012-11-30 – 2012-12-01 (×3): 2 [IU] via SUBCUTANEOUS

## 2012-11-28 MED ORDER — PANTOPRAZOLE SODIUM 40 MG PO TBEC
40.0000 mg | DELAYED_RELEASE_TABLET | Freq: Every day | ORAL | Status: DC
  Administered 2012-11-29 – 2012-12-01 (×3): 40 mg via ORAL
  Filled 2012-11-28 (×3): qty 1

## 2012-11-28 MED ORDER — CHLORHEXIDINE GLUCONATE 4 % EX LIQD
CUTANEOUS | Status: AC
  Administered 2012-11-29: 06:00:00
  Filled 2012-11-28: qty 60

## 2012-11-28 MED ORDER — FERROUS SULFATE 325 (65 FE) MG PO TABS
325.0000 mg | ORAL_TABLET | Freq: Two times a day (BID) | ORAL | Status: DC
  Administered 2012-11-28 – 2012-12-01 (×6): 325 mg via ORAL
  Filled 2012-11-28 (×9): qty 1

## 2012-11-28 NOTE — ED Notes (Signed)
Pt sts some dizziness today and noticed HR was low; pt sts some SOB; pt HR 45bpm

## 2012-11-28 NOTE — ED Provider Notes (Addendum)
History     CSN: [id number]  Arrival date & time 11/28/12  1442   First MD Initiated Contact with Patient 11/28/12 [phone]      Chief Complaint  Patient presents with  . Bradycardia    (Consider location/radiation/quality/duration/timing/severity/associated sxs/prior treatment) Patient is a 79 y.o. female presenting with weakness. The history is provided by the patient.  Weakness This is a new problem. The current episode started 2 days ago. The problem occurs constantly. The problem has been gradually worsening. Pertinent negatives include no chest pain and no shortness of breath. Associated symptoms comments: States that she did not feel well over the last few days but worse today with standing and attempting to walk. States that she felt dizzy and lightheaded and near syncopal today. At that time she checked her pulse and noticed it was low.  She denies any chest pain or shortness of breath. The symptoms are aggravated by walking and standing. Nothing relieves the symptoms. She has tried nothing for the symptoms. The treatment provided no relief.    Past Medical History  Diagnosis Date  . Coronary artery disease   . Celiac disease   . Heart disease   . Arthritis   . Glaucoma   . Hypercholesterolemia   . Hypertension   . Stroke   . Shortness of breath   . Diabetes mellitus     INSULIN DEPENDENT  . GERD (gastroesophageal reflux disease)   . Headache   . Anemia   . Sleep apnea     uses cpap    Past Surgical History  Procedure Laterality Date  . Cardiac surgery    . Back surgery    . Cardiac catheterization  09/02/2012  . Coronary angioplasty with stent placement  09/02/2012    RCA    Family History  Problem Relation Age of Onset  . Diabetes Mother   . Hypertension Mother   . Hyperlipidemia Mother   . Cancer Sister   . Dementia Sister   . Neuropathy Sister     History  Substance Use Topics  . Smoking status: Former Smoker    Quit date: 12/26/1976  . Smokeless  tobacco: Never Used  . Alcohol Use: No    OB History   Grav Para Term Preterm Abortions TAB SAB Ect Mult Living   4 4 4       3       Review of Systems  Respiratory: Negative for shortness of breath.   Cardiovascular: Negative for chest pain.  Neurological: Positive for weakness.  All other systems reviewed and are negative.    Allergies  Review of patient's allergies indicates no known allergies.  Home Medications   Current Outpatient Rx  Name  Route  Sig  Dispense  Refill  . amLODipine (NORVASC) 10 MG tablet   Oral   Take 5 mg by mouth daily.         Marland Kitchen aspirin EC 81 MG tablet   Oral   Take 81 mg by mouth daily.         Marland Kitchen atorvastatin (LIPITOR) 40 MG tablet   Oral   Take 80 mg by mouth daily.          Marland Kitchen BIOTIN PO   Oral   Take 1 tablet by mouth daily.         Marland Kitchen CALCIUM-VITAMIN D PO   Oral   Take 1 tablet by mouth daily.         . clopidogrel (PLAVIX) 75 MG  tablet   Oral   Take 75 mg by mouth daily.         . COMBIGAN 0.2-0.5 % ophthalmic solution   Both Eyes   Place 1 drop into both eyes every 12 (twelve) hours.          . Cyanocobalamin (VITAMIN B12 PO)   Oral   Take 1 tablet by mouth daily.         Marland Kitchen docusate sodium 100 MG CAPS   Oral   Take 100 mg by mouth 2 (two) times daily.   60 capsule   0   . esomeprazole (NEXIUM) 40 MG capsule   Oral   Take 40 mg by mouth daily before breakfast.         . ferrous sulfate 325 (65 FE) MG tablet   Oral   Take 325 mg by mouth 2 (two) times daily.          . Flaxseed, Linseed, (FLAXSEED OIL PO)   Oral   Take 1 capsule by mouth 2 (two) times daily.         . insulin detemir (LEVEMIR) 100 UNIT/ML injection   Subcutaneous   Inject 40 Units into the skin every morning.    10 mL   0   . latanoprost (XALATAN) 0.005 % ophthalmic solution   Both Eyes   Place 1 drop into both eyes at bedtime.          . Multiple Vitamin (MULTIVITAMIN WITH MINERALS) TABS   Oral   Take 1 tablet by  mouth daily.         Marland Kitchen NIACIN PO   Oral   Take 1 tablet by mouth daily.         . nitroGLYCERIN (NITROSTAT) 0.4 MG SL tablet   Sublingual   Place 0.4 mg under the tongue every 5 (five) minutes as needed. Chest pains         . oxyCODONE (OXY IR/ROXICODONE) 5 MG immediate release tablet   Oral   Take 1 tablet (5 mg total) by mouth every 4 (four) hours as needed.   30 tablet   0   . oxyCODONE-acetaminophen (PERCOCET) 5-325 MG per tablet   Oral   Take 2 tablets by mouth every 4 (four) hours as needed for pain.   20 tablet   0   . polyethylene glycol (MIRALAX / GLYCOLAX) packet   Oral   Take 17 g by mouth daily.   14 each      . valsartan-hydrochlorothiazide (DIOVAN-HCT) 320-25 MG per tablet   Oral   Take 1 tablet by mouth daily.           BP 168/76  Pulse 33  Temp(Src) 98.1 F (36.7 C)  Resp 18  SpO2 100%  Physical Exam  Nursing note and vitals reviewed. Constitutional: She is oriented to person, place, and time. She appears well-developed and well-nourished. No distress.  HENT:  Head: Normocephalic and atraumatic.  Mouth/Throat: Oropharynx is clear and moist.  Wearing a soft neck collar  Eyes: Conjunctivae and EOM are normal. Pupils are equal, round, and reactive to light.  Neck: Normal range of motion. Neck supple.  Cardiovascular: Regular rhythm and intact distal pulses.  Bradycardia present.   No murmur heard. Pulmonary/Chest: Effort normal and breath sounds normal. No respiratory distress. She has no wheezes. She has no rales.  Abdominal: Soft. She exhibits no distension. There is no tenderness. There is no rebound and no guarding.  Musculoskeletal: Normal range  of motion. She exhibits no edema and no tenderness.  Neurological: She is alert and oriented to person, place, and time.  Skin: Skin is warm and dry. No rash noted. No erythema.  Psychiatric: She has a normal mood and affect. Her behavior is normal.    ED Course  Procedures (including  critical care time)  Labs Reviewed  CBC WITH DIFFERENTIAL  BASIC METABOLIC PANEL  MAGNESIUM   No results found.   Date: 11/28/2012  Rate: 34  Rhythm: bradycardia  QRS Axis: indeterminate  Intervals: indeterminate  ST/T Wave abnormalities: indeterminate  Conduction Disutrbances:complete heart block  Narrative Interpretation:   Old EKG Reviewed: changes noted   1. Complete heart block      CRITICAL CARE Performed by: Blanchie Dessert   Total critical care time: 30  Critical care time was exclusive of separately billable procedures and treating other patients.  Critical care was necessary to treat or prevent imminent or life-threatening deterioration.  Critical care was time spent personally by me on the following activities: development of treatment plan with patient and/or surrogate as well as nursing, discussions with consultants, evaluation of patient's response to treatment, examination of patient, obtaining history from patient or surrogate, ordering and performing treatments and interventions, ordering and review of laboratory studies, ordering and review of radiographic studies, pulse oximetry and re-evaluation of patient's condition.  MDM   Pt presented due to not feeling well for last 2 days.  Near syncope today and noticed her HR was low.  Recent cath and stents placed in jan.  Pt today in complete heart block with stable BP.  Pt is ok while lying down.  Walked in with a walker.  Denies recent med changes and is not on BB therapy.  CBC, BMP, magnesium, calcium, troponin,chest x-ray pending. EKG with complete heart block.    Dr. Terrence Dupont coming to see the pt for pacer placement.  Pacer pads on the pt incase requires pacing but currently stable.  Blanchie Dessert, MD 11/28/12 [place], MD 11/28/12 1526

## 2012-11-28 NOTE — ED Notes (Signed)
Pt remains in complete heart block. Rate 30 bpm. Pt denies blurred vision, dizziness. States " I just feel funny." Family at bedside. Pt remains AO x4. Pt teaching provided on heart block.

## 2012-11-28 NOTE — ED Notes (Signed)
Cardiologist at bedside.  
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
## 2012-11-28 NOTE — CV Procedure (Signed)
Temporary transvenous pacemaker insertion procedure report dictated on 11/28/2012 dictation number is [phone]

## 2012-11-28 NOTE — ED Notes (Signed)
2 RNs and MD to bedside, 2 PIV placed, pt on zoll monitor, labs drawn

## 2012-11-29 ENCOUNTER — Encounter (HOSPITAL_COMMUNITY)
Admission: EM | Disposition: A | Discharge status: Home / Self Care | Payer: Self-pay | Source: Home / Self Care | Attending: Cardiology

## 2012-11-29 ENCOUNTER — Encounter: Payer: Medicare Other | Admitting: Physical Therapy

## 2012-11-29 DIAGNOSIS — I442 Atrioventricular block, complete: Principal | ICD-10-CM

## 2012-11-29 DIAGNOSIS — I509 Heart failure, unspecified: Secondary | ICD-10-CM

## 2012-11-29 DIAGNOSIS — I459 Conduction disorder, unspecified: Secondary | ICD-10-CM

## 2012-11-29 HISTORY — PX: PERMANENT PACEMAKER INSERTION: SHX5480

## 2012-11-29 LAB — HEMOGLOBIN A1C
Hgb A1c MFr Bld: 8.5 % — ABNORMAL HIGH (ref <5.7)
Mean Plasma Glucose: 197 mg/dL — ABNORMAL HIGH (ref <117)

## 2012-11-29 LAB — LIPID PANEL
Cholesterol: 249 mg/dL — ABNORMAL HIGH (ref 0–200)
Total CHOL/HDL Ratio: 4.9 RATIO
Triglycerides: 118 mg/dL (ref <150)
VLDL: 24 mg/dL (ref 0–40)

## 2012-11-29 LAB — CBC
MCV: 86.6 fL (ref 78.0–100.0)
Platelets: 185 10*3/uL (ref 150–400)
RBC: 3.74 MIL/uL — ABNORMAL LOW (ref 3.87–5.11)
RDW: 13.5 % (ref 11.5–15.5)
WBC: 6.2 10*3/uL (ref 4.0–10.5)

## 2012-11-29 LAB — BASIC METABOLIC PANEL
CO2: 28 mEq/L (ref 19–32)
Chloride: 102 mEq/L (ref 96–112)
Creatinine, Ser: 1.28 mg/dL — ABNORMAL HIGH (ref 0.50–1.10)
GFR calc Af Amer: 44 mL/min — ABNORMAL LOW (ref >90)
Sodium: 138 mEq/L (ref 135–145)

## 2012-11-29 SURGERY — PERMANENT PACEMAKER INSERTION
Anesthesia: LOCAL

## 2012-11-29 MED ORDER — SODIUM CHLORIDE 0.9 % IV SOLN: INTRAVENOUS | Status: AC

## 2012-11-29 MED ORDER — ONDANSETRON HCL 4 MG/2ML IJ SOLN: 4.0000 mg | Freq: Four times a day (QID) | INTRAMUSCULAR | Status: DC | PRN

## 2012-11-29 MED ORDER — SODIUM CHLORIDE 0.9 % IV SOLN
INTRAVENOUS | Status: DC
  Administered 2012-11-29: 01:00:00 via INTRAVENOUS

## 2012-11-29 MED ORDER — LIDOCAINE HCL (PF) 1 % IJ SOLN
INTRAMUSCULAR | Status: AC
  Filled 2012-11-29: qty 30

## 2012-11-29 MED ORDER — CEFAZOLIN SODIUM-DEXTROSE 2-3 GM-% IV SOLR
2.0000 g | INTRAVENOUS | Status: DC
  Filled 2012-11-29: qty 50

## 2012-11-29 MED ORDER — SODIUM CHLORIDE 0.45 % IV SOLN
INTRAVENOUS | Status: DC
  Administered 2012-11-29: 09:00:00 via INTRAVENOUS

## 2012-11-29 MED ORDER — CHLORHEXIDINE GLUCONATE 4 % EX LIQD: 60.0000 mL | Freq: Once | CUTANEOUS | Status: DC

## 2012-11-29 MED ORDER — MIDAZOLAM HCL 2 MG/2ML IJ SOLN
INTRAMUSCULAR | Status: AC
  Filled 2012-11-29: qty 2

## 2012-11-29 MED ORDER — SODIUM CHLORIDE 0.9 % IJ SOLN: 3.0000 mL | INTRAMUSCULAR | Status: DC | PRN

## 2012-11-29 MED ORDER — FENTANYL CITRATE 0.05 MG/ML IJ SOLN
INTRAMUSCULAR | Status: AC
  Filled 2012-11-29: qty 2

## 2012-11-29 MED ORDER — SODIUM CHLORIDE 0.9 % IJ SOLN: 3.0000 mL | Freq: Two times a day (BID) | INTRAMUSCULAR | Status: DC

## 2012-11-29 MED ORDER — SODIUM CHLORIDE 0.9 % IR SOLN
80.0000 mg | Status: DC
  Filled 2012-11-29: qty 2

## 2012-11-29 MED ORDER — CHLORHEXIDINE GLUCONATE 4 % EX LIQD
CUTANEOUS | Status: AC
  Administered 2012-11-29: 06:00:00
  Filled 2012-11-29: qty 60

## 2012-11-29 MED ORDER — ACETAMINOPHEN 325 MG PO TABS: 325.0000 mg | ORAL_TABLET | ORAL | Status: DC | PRN

## 2012-11-29 MED ORDER — ASPIRIN 325 MG PO TABS
ORAL_TABLET | ORAL | Status: AC
  Administered 2012-11-29: 325 mg
  Filled 2012-11-29: qty 1

## 2012-11-29 MED ORDER — SODIUM CHLORIDE 0.9 % IV SOLN
250.0000 mL | INTRAVENOUS | Status: DC
  Administered 2012-11-29: 1000 mL via INTRAVENOUS

## 2012-11-29 MED ORDER — CEFAZOLIN SODIUM 1-5 GM-% IV SOLN
1.0000 g | Freq: Four times a day (QID) | INTRAVENOUS | Status: AC
  Administered 2012-11-29 – 2012-11-30 (×3): 1 g via INTRAVENOUS
  Filled 2012-11-29 (×3): qty 50

## 2012-11-29 NOTE — CV Procedure (Signed)
[name] [id number]  [id number]  Preop Dx: CHB ischemic cardiomyopathy Postop Dx same/  Procedure: dual chamber pacemaker placement with LV lead placement  Cx: None   Dictation number [id number]  Virl Axe, MD 11/29/2012 5:25 PM
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
## 2012-11-29 NOTE — Progress Notes (Signed)
Orthopedic Tech Progress Note Patient Details:  [name] [birth year] [id number]  Ortho Devices Type of Ortho Device: Arm sling Ortho Device/Splint Location: left arm Ortho Device/Splint Interventions: Application   [name] 11/29/2012, 7:06 PM
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
## 2012-11-29 NOTE — Progress Notes (Signed)
  Echocardiogram 2D Echocardiogram has been performed.  [name] 11/29/2012, 11:39 AM

## 2012-11-29 NOTE — Care Management Note (Signed)
    Page 1 of 1   11/29/2012     9:58:46 AM   CARE MANAGEMENT NOTE 11/29/2012  Patient:  [name], [name]   Account Number:  [id number]  Date Initiated:  11/29/2012  Documentation initiated by:  Elissa Hefty  Subjective/Objective Assessment:   adm w complete heart block     Action/Plan:   lives w fam, pcp dr Murvin Donning, hx of adv homecare   Anticipated DC Date:     Anticipated DC Plan:        DC Planning Services  CM consult      Choice offered to / List presented to:             Status of service:   Medicare Important Message given?   (If response is "NO", the following Medicare IM given date fields will be blank) Date Medicare IM given:   Date Additional Medicare IM given:    Discharge Disposition:    Per UR Regulation:  Reviewed for med. necessity/level of care/duration of stay  If discussed at [place] of Stay Meetings, dates discussed:    Comments:  2/10 0957 debbie Sholonda Jobst rn,bsn

## 2012-11-30 ENCOUNTER — Encounter: Payer: Medicare Other | Admitting: Physical Therapy

## 2012-11-30 ENCOUNTER — Encounter: Payer: Medicare Other | Admitting: Rehabilitative and Restorative Service Providers"

## 2012-11-30 ENCOUNTER — Inpatient Hospital Stay (HOSPITAL_COMMUNITY): Payer: Medicare Other

## 2012-11-30 LAB — GLUCOSE, CAPILLARY
Glucose-Capillary: 192 mg/dL — ABNORMAL HIGH (ref 70–99)
Glucose-Capillary: 177 mg/dL — ABNORMAL HIGH (ref 70–99)
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
## 2012-11-30 NOTE — Progress Notes (Signed)
   ELECTROPHYSIOLOGY ROUNDING NOTE    Patient Name: [name] Date of Encounter: 11-30-2012    Amelia feels well.  No chest pain or shortness of breath.  Minimal incisional soreness.  S/p CRTP implant 11-29-2012  TELEMETRY: Reviewed telemetry pt in AV pacing Filed Vitals:   11/30/12 0216 11/30/12 0300 11/30/12 0400 11/30/12 0500  BP: 96/43 81/56 141/65 138/66  Pulse: 70 70 74 71  Temp:  98.8 F (37.1 C)    TempSrc:  Oral    Resp: 17 17 18 16   Height:      Weight:      SpO2: 97% 97% 98% 99%    Intake/Output Summary (Last 24 hours) at 11/30/12 0609 Last data filed at 11/30/12 0500  Gross per 24 hour  Intake 1636.33 ml  Output   1875 ml  Net -238.67 ml    LABS: Basic Metabolic Panel:  Recent Labs  11/28/12 1454 11/28/12 1832 11/29/12 0530  NA 141 138 138  K 3.8 4.1 3.8  CL 104 103 102  CO2 27 24 28   GLUCOSE 147* 127* 140*  BUN 20 20 19   CREATININE 1.37* 1.20* 1.28*  CALCIUM 10.3 10.3 9.8  MG 2.0 2.1  --    Liver Function Tests:  Recent Labs  11/28/12 1832  AST 19  ALT 12  ALKPHOS 104  BILITOT 0.2*  PROT 7.2  ALBUMIN 3.3*   CBC:  Recent Labs  11/28/12 1454 11/28/12 1832 11/29/12 0530  WBC 6.7 5.6 6.2  NEUTROABS 3.1 2.4  --   HGB 11.3* 12.4 10.5*  HCT 34.7* 38.3 32.4*  MCV 87.0 86.8 86.6  PLT 232 121* 185   Cardiac Enzymes:  Recent Labs  11/28/12 1832 11/29/12 0542  TROPONINI <0.30 <0.30   Hemoglobin A1C:  Recent Labs  11/28/12 1832  HGBA1C 8.5*   Fasting Lipid Panel:  Recent Labs  11/29/12 0530  CHOL 249*  HDL 51  LDLCALC 174*  TRIG 118  CHOLHDL 4.9   Thyroid Function Tests:  Recent Labs  11/28/12 1832  TSH 1.953    Radiology/Studies:  Final result pending, leads in stable position.  PHYSICAL EXAM Left chest without hematoma or ecchymosis.  Tegaderm dressing left in place.   DEVICE INTERROGATION: Device interrogation pending  Wound care, arm mobility reviewed with patient.  Will review  again prior to discharge.  Well developed and nourished in no acute distress HENT normal Neck supple with JVP-flat Clear Regular rate and rhythm, no murmurs or gallops Abd-soft with active BS No Clubbing cyanosis edema Skin-warm and dry A & Oriented  Grossly normal sensory and motor function   Ambulate as inpt today and from my perspective anticipate discharge tomorrow

## 2012-11-30 NOTE — Progress Notes (Signed)
Inpatient Diabetes Program Recommendations  AACE/ADA: New Consensus Statement on Inpatient Glycemic Control (2013)  Target Ranges:  Prepandial:   less than 140 mg/dL      Peak postprandial:   less than 180 mg/dL (1-2 hours)      Critically ill patients:  140 - 180 mg/dL   Inpatient Diabetes Program Recommendations Insulin - Basal: add Levemir 20 units (1/2 home dose) Per H&P patient has Type-1 insulin dependent DM.  Patient must have basal insulin.  Recommend starting with 1/2 home dose and titrating up if fasting CBG is elevated.  May also need to add Novolog prandial insulin with meals per Glycemic Control order set.   Thank you  Raoul Pitch BSN, RN,CDE Inpatient Diabetes Coordinator [phone] (team pager)

## 2012-11-30 NOTE — Op Note (Signed)
NAMEGREGORIA, [name]             ACCOUNT NO.:  [id number]  MEDICAL RECORD NO.:  [id number]  LOCATION:  2908                         FACILITY:  [facility]  PHYSICIAN:  Deboraha Sprang, MD, FACCDATE OF BIRTH:  1928-08-28  DATE OF PROCEDURE:  11/29/2012 DATE OF DISCHARGE:                              OPERATIVE REPORT   PREOPERATIVE DIAGNOSIS:  Complete heart block, mild cardiomyopathy with ejection fraction of 40%  ischemic cardiomyopathy.  POSTOPERATIVE DIAGNOSIS:  Complete heart block, mild cardiomyopathy with ejection fraction of 40% ischemic cardiomyopathy.  PROCEDURE:  Dual-chamber pacemaker implantation with left ventricular lead placement.  DESCRIPTION OF PROCEDURE:  Following obtaining informed consent, the patient was brought to the Electrophysiology Laboratory and placed on the fluoroscopic table in the supine position.  After routine prep and drape of the left upper chest, lidocaine was infiltrated in the prepectoral subclavicular region.  Incision was made and carried down to layer of the prepectoral fascia using electrocautery and sharp dissection.  A pocket was formed similarly.  Hemostasis was obtained.  Thereafter, attention was turned again for access to the extrathoracic left subclavian vein which was accomplished without difficulty without the aspiration of air or puncture of the artery.  Three separate venipunctures were accomplished.  Guidewires were placed and retained and sequentially 9, 9.5, and 7-French sheaths were placed which were passed.  A St. Jude 2080 ATC 58 cm active fixation ventricular lead, serial #[id number] Medtronic MB2, replaced by multipurpose coronary sinus cannulation system and a St. Jude 2080 ATC active fixation atrial lead, 46 cm of length, serial #[id number].  Under fluoroscopic guidance, the RV lead was manipulated to the apex where the bipolar paced sensed R- wave was 22 with a pace impedance of 855, a threshold of 0.9 at 0.4, current  threshold was about 1.1 mA.  There was no diaphragmatic pacing at 10 V and the current of injury was brisk.  This lead was secured to the prepectoral fascia.  We then attempted to gain access to the coronary sinus which was moderately difficult.  It turned out to be anteriorly located.  We were finally able to cannulate it.  There were 2 branches on the lateral wall, both of which had a very acute takeoff and we went to a high lateral branch and passed a St. Jude 1258 lead, serial #[id number] to a midpoint between base and apex.  In this location, the sensed R-wave was 30.4 mV with a pacing impedance of 703 and threshold less than 2 V at 0.4.  There was no diaphragmatic pacing at 10 V.  At this point, we then turned our attention to deploy in the right atrial lead and noted that the P-wave was 5.1 with a pace impedance of 1360 and threshold was less than 1 V at 0.4 milliseconds. Again, the current of injury was brisk.  This lead was secured to the prepectoral fascia and then the LV deployment system was removed.  The lead secured.  The pocket had to be expanded because of the larger RF generator.  This was done.  Hemostasis was obtained.  Surgicel was used as I had violated the prepectoral fascia.  The leads in the pulse generator were placed in the  pocket, secured to the prepectoral fascia, and then the wound was closed in 3 layers in normal fashion.  The wound was washed, dried, and a benzoin Steri-Strip was applied.  Needle counts, sponge counts, and instrument counts were correct at the end of the procedure according to the staff.  The patient tolerated the procedure without apparent complication.     Deboraha Sprang, MD, [facility]     SCK/MEDQ  D:  11/29/2012  T:  11/30/2012  Job:  [id number]

## 2012-12-01 LAB — GLUCOSE, CAPILLARY: Glucose-Capillary: 154 mg/dL — ABNORMAL HIGH (ref 70–99)

## 2012-12-01 MED ORDER — CARVEDILOL 3.125 MG PO TABS: 3.1250 mg | ORAL_TABLET | Freq: Two times a day (BID) | ORAL | Status: DC

## 2012-12-01 NOTE — Progress Notes (Signed)
  Patient Name: [name]       SUBJECTIVE:    Feeling well without chest pain    Past Medical History  Diagnosis Date  . Coronary artery disease   . Celiac disease   . Heart disease   . Arthritis   . Glaucoma   . Hypercholesterolemia   . Hypertension   . Stroke   . Shortness of breath   . Diabetes mellitus     INSULIN DEPENDENT  . GERD (gastroesophageal reflux disease)   . Headache   . Anemia   . Sleep apnea     uses cpap    PHYSICAL EXAM Filed Vitals:   12/01/12 0300 12/01/12 0400 12/01/12 0500 12/01/12 0600  BP:  95/60    Pulse: 78 72    Temp:  98 F (36.7 C)    TempSrc:  Oral    Resp: 24 22 17 12   Height:      Weight:      SpO2: 89% 94%     Well developed and nourished in no acute distress HENT normal Neck supple with JVP-flat  Device without hematoma or erythema\ pClear Regular rate and rhythm, no murmurs or gallops Abd-soft with active BS without hepatomegaly No Clubbing cyanosis edema Skin-warm and dry A & Oriented  Grossly normal sensory and motor function  TELEMETRY: Reviewed telemetry pt in av pacing   Intake/Output Summary (Last 24 hours) at 12/01/12 0730 Last data filed at 11/30/12 2000  Gross per 24 hour  Intake   1330 ml  Output   2550 ml  Net  -1220 ml    LABS: Basic Metabolic Panel:  Recent Labs Lab 11/28/12 1454 11/28/12 1832 11/29/12 0530  NA 141 138 138  K 3.8 4.1 3.8  CL 104 103 102  CO2 27 24 28   GLUCOSE 147* 127* 140*  BUN 20 20 19   CREATININE 1.37* 1.20* 1.28*  CALCIUM 10.3 10.3 9.8  MG 2.0 2.1  --    Cardiac Enzymes:  Recent Labs  11/28/12 1832 11/29/12 0542  TROPONINI <0.30 <0.30   CBC:  Recent Labs Lab 11/28/12 1454 11/28/12 1832 11/29/12 0530  WBC 6.7 5.6 6.2  NEUTROABS 3.1 2.4  --   HGB 11.3* 12.4 10.5*  HCT 34.7* 38.3 32.4*  MCV 87.0 86.8 86.6  PLT 232 121* 185   PROTIME:  Recent Labs  11/28/12 1832  LABPROT 12.7  INR 0.96   Liver Function Tests:  Recent Labs  11/28/12 1832  AST 19  ALT 12  ALKPHOS 104  BILITOT 0.2*  PROT 7.2  ALBUMIN 3.3*     Recent Labs  11/28/12 1832  HGBA1C 8.5*   Fasting Lipid Panel:  Recent Labs  11/29/12 0530  CHOL 249*  HDL 51  LDLCALC 174*  TRIG 118  CHOLHDL 4.9   Thyroid Function Tests:  Recent Labs  11/28/12 1832  TSH 1.953   Anemia Panel: No results found for this basename: VITAMINB12, FOLATE, FERRITIN, TIBC, IRON, RETICCTPCT,  in the last 72 hours   Device Interrogation pending but tel device stable  ASSESSMENT AND PLAN:  CHB  S/p CRT Ischemic Caridomyopathy CHF  Signed, Virl Axe MD  12/01/2012   a
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
## 2012-12-01 NOTE — Progress Notes (Signed)
Patient discharged via wheelchair to home with daughter after ambulation.  D/C instructions given with all questions and concerns addressed. Telemetry disconnected and PIV discontinued.  Patient awake, alert and oriented prior to discharge.

## 2012-12-01 NOTE — Discharge Summary (Signed)
  Discharge summary dictated on 12/01/2012 dictation number is [id number]

## 2012-12-02 ENCOUNTER — Encounter: Payer: Medicare Other | Admitting: Rehabilitative and Restorative Service Providers"

## 2012-12-02 ENCOUNTER — Encounter: Payer: Medicare Other | Admitting: Physical Therapy

## 2012-12-03 ENCOUNTER — Encounter: Payer: Medicare Other | Admitting: Physical Therapy

## 2012-12-07 ENCOUNTER — Ambulatory Visit: Payer: No Typology Code available for payment source | Admitting: Physical Therapy

## 2012-12-07 ENCOUNTER — Encounter: Payer: Medicare Other | Admitting: Rehabilitative and Restorative Service Providers"

## 2012-12-09 ENCOUNTER — Encounter: Payer: Medicare Other | Admitting: Physical Therapy

## 2012-12-09 ENCOUNTER — Encounter: Payer: Medicare Other | Admitting: Rehabilitative and Restorative Service Providers"

## 2012-12-13 ENCOUNTER — Ambulatory Visit (INDEPENDENT_AMBULATORY_CARE_PROVIDER_SITE_OTHER): Payer: Medicare Other | Admitting: *Deleted

## 2012-12-13 ENCOUNTER — Other Ambulatory Visit: Payer: Self-pay

## 2012-12-13 DIAGNOSIS — Z950 Presence of cardiac pacemaker: Secondary | ICD-10-CM

## 2012-12-13 DIAGNOSIS — I459 Conduction disorder, unspecified: Secondary | ICD-10-CM

## 2012-12-13 LAB — PACEMAKER DEVICE OBSERVATION
AL IMPEDENCE PM: 390 Ohm
ATRIAL PACING PM: 79
BATTERY VOLTAGE: 2.95 V
RV LEAD IMPEDENCE PM: 540 Ohm

## 2012-12-13 NOTE — Progress Notes (Signed)
Patient presents for wound check of recently implanted pacemaker. Wound well healed.   No problems with shortness of breath, chest pain, palpitations, or syncope.  Device interrogated and found to be functioning normally.  No changes made today.  See PaceArt report for full details.  Plan ROV with Dr. Caryl Comes in 3 months.  Chanetta Marshall, RN, BSN 12/13/2012 3:27 PM

## 2012-12-16 ENCOUNTER — Encounter: Payer: Medicare Other | Admitting: Physical Therapy

## 2012-12-18 HISTORY — PX: BI-VENTRICULAR PACEMAKER INSERTION (CRT-P): SHX5750

## 2013-01-03 ENCOUNTER — Encounter: Payer: Self-pay | Admitting: Internal Medicine

## 2013-02-15 ENCOUNTER — Encounter: Payer: Self-pay | Admitting: *Deleted

## 2013-02-16 ENCOUNTER — Encounter: Payer: Self-pay | Admitting: Internal Medicine

## 2013-02-16 ENCOUNTER — Ambulatory Visit (INDEPENDENT_AMBULATORY_CARE_PROVIDER_SITE_OTHER): Payer: Medicare Other | Admitting: Internal Medicine

## 2013-02-16 VITALS — BP 126/82 | HR 62 | Temp 98.2°F | Resp 15 | Ht 64.0 in | Wt 179.0 lb

## 2013-02-16 DIAGNOSIS — I6350 Cerebral infarction due to unspecified occlusion or stenosis of unspecified cerebral artery: Secondary | ICD-10-CM

## 2013-02-16 DIAGNOSIS — K5900 Constipation, unspecified: Secondary | ICD-10-CM | POA: Insufficient documentation

## 2013-02-16 DIAGNOSIS — I2510 Atherosclerotic heart disease of native coronary artery without angina pectoris: Secondary | ICD-10-CM

## 2013-02-16 DIAGNOSIS — I10 Essential (primary) hypertension: Secondary | ICD-10-CM

## 2013-02-16 DIAGNOSIS — E119 Type 2 diabetes mellitus without complications: Secondary | ICD-10-CM

## 2013-02-16 DIAGNOSIS — S129XXS Fracture of neck, unspecified, sequela: Secondary | ICD-10-CM

## 2013-02-16 DIAGNOSIS — G4733 Obstructive sleep apnea (adult) (pediatric): Secondary | ICD-10-CM

## 2013-02-16 DIAGNOSIS — I639 Cerebral infarction, unspecified: Secondary | ICD-10-CM

## 2013-02-16 DIAGNOSIS — I459 Conduction disorder, unspecified: Secondary | ICD-10-CM

## 2013-02-16 DIAGNOSIS — M949 Disorder of cartilage, unspecified: Secondary | ICD-10-CM

## 2013-02-16 DIAGNOSIS — IMO0002 Reserved for concepts with insufficient information to code with codable children: Secondary | ICD-10-CM

## 2013-02-16 DIAGNOSIS — M899 Disorder of bone, unspecified: Secondary | ICD-10-CM

## 2013-02-16 MED ORDER — TRAMADOL HCL 50 MG PO TABS: 50.0000 mg | ORAL_TABLET | Freq: Four times a day (QID) | ORAL | Status: DC | PRN

## 2013-02-16 MED ORDER — POLYETHYLENE GLYCOL 3350 17 GM/SCOOP PO POWD: 17.0000 g | Freq: Every day | ORAL | Status: DC

## 2013-02-16 NOTE — Assessment & Plan Note (Signed)
Asked her to take tramadol 50 mg q6h prn for now and script provided. To see neurosurgery. Avoid jerking movement for the neck. To notify if has any numbness or tingling or weakness of extremities

## 2013-02-16 NOTE — Assessment & Plan Note (Signed)
Will continue plavix and asa, continue statin, bp under control
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
## 2013-02-16 NOTE — Assessment & Plan Note (Addendum)
For several years. Under control. Will not make changes. Continue valsartan-hctz, carvedilol and amlodipine. Reviewed bmp

## 2013-02-16 NOTE — Assessment & Plan Note (Signed)
To continue using cpap at bedtime

## 2013-02-16 NOTE — Assessment & Plan Note (Addendum)
Continue levemir 40 u daily. On ARB, ASA and lipitor Denies hypoglycemic episodes Check a1c prior to next visit with cmp Will check urine microalbumin next visit

## 2013-02-16 NOTE — Assessment & Plan Note (Signed)
S/p pacemaker. Follows with Dr Terrence Dupont

## 2013-02-16 NOTE — Assessment & Plan Note (Addendum)
S/p stent, on asa, plavix, coreg, valsartan and statin. Remains chest pain free.

## 2013-02-16 NOTE — Assessment & Plan Note (Signed)
Encouraged fiber intake. To continue docusate and will add miralax 17 g daily for now. Reassess next visit

## 2013-02-16 NOTE — Patient Instructions (Addendum)
Constipation, Adult Constipation is when a person has fewer than 3 bowel movements a week; has difficulty having a bowel movement; or has stools that are dry, hard, or larger than normal. As people grow older, constipation is more common. If you try to fix constipation with medicines that make you have a bowel movement (laxatives), the problem may get worse. Long-term laxative use may cause the muscles of the colon to become weak. A low-fiber diet, not taking in enough fluids, and taking certain medicines may make constipation worse. CAUSES   Certain medicines, such as antidepressants, pain medicine, iron supplements, antacids, and water pills.   Certain diseases, such as diabetes, irritable bowel syndrome (IBS), thyroid disease, or depression.   Not drinking enough water.   Not eating enough fiber-rich foods.   Stress or travel.  Lack of physical activity or exercise.  Not going to the restroom when there is the urge to have a bowel movement.  Ignoring the urge to have a bowel movement.  Using laxatives too much. SYMPTOMS   Having fewer than 3 bowel movements a week.   Straining to have a bowel movement.   Having hard, dry, or larger than normal stools.   Feeling full or bloated.   Pain in the lower abdomen.  Not feeling relief after having a bowel movement. DIAGNOSIS  Your caregiver will take a medical history and perform a physical exam. Further testing may be done for severe constipation. Some tests may include:   A barium enema X-ray to examine your rectum, colon, and sometimes, your small intestine.  A sigmoidoscopy to examine your lower colon.  A colonoscopy to examine your entire colon. TREATMENT  Treatment will depend on the severity of your constipation and what is causing it. Some dietary treatments include drinking more fluids and eating more fiber-rich foods. Lifestyle treatments may include regular exercise. If these diet and lifestyle recommendations  do not help, your caregiver may recommend taking over-the-counter laxative medicines to help you have bowel movements. Prescription medicines may be prescribed if over-the-counter medicines do not work.  HOME CARE INSTRUCTIONS   Increase dietary fiber in your diet, such as fruits, vegetables, whole grains, and beans. Limit high-fat and processed sugars in your diet, such as [place] fries, hamburgers, cookies, candies, and soda.   A fiber supplement may be added to your diet if you cannot get enough fiber from foods.   Drink enough fluids to keep your urine clear or pale yellow.   Exercise regularly or as directed by your caregiver.   Go to the restroom when you have the urge to go. Do not hold it.  Only take medicines as directed by your caregiver. Do not take other medicines for constipation without talking to your caregiver first. Walls IF:   You have bright red blood in your stool.   Your constipation lasts for more than 4 days or gets worse.   You have abdominal or rectal pain.   You have thin, pencil-like stools.  You have unexplained weight loss. MAKE SURE YOU:   Understand these instructions.  Will watch your condition.  Will get help right away if you are not doing well or get worse. Document Released: 07/04/2004 Document Revised: 12/29/2011 Document Reviewed: 09/09/2011 [facility] Patient Information 2013 [place].

## 2013-03-01 ENCOUNTER — Other Ambulatory Visit: Payer: Self-pay | Admitting: Adult Health

## 2013-03-01 ENCOUNTER — Other Ambulatory Visit: Payer: Self-pay | Admitting: Neurological Surgery

## 2013-03-01 DIAGNOSIS — M5412 Radiculopathy, cervical region: Secondary | ICD-10-CM

## 2013-03-09 ENCOUNTER — Ambulatory Visit
Admission: RE | Admit: 2013-03-09 | Discharge: 2013-03-09 | Disposition: A | Discharge status: Home / Self Care | Payer: Medicare Other | Source: Ambulatory Visit | Attending: Neurological Surgery | Admitting: Neurological Surgery

## 2013-03-09 DIAGNOSIS — M5412 Radiculopathy, cervical region: Secondary | ICD-10-CM

## 2013-03-28 ENCOUNTER — Other Ambulatory Visit: Payer: Medicare Other

## 2013-03-28 DIAGNOSIS — E119 Type 2 diabetes mellitus without complications: Secondary | ICD-10-CM

## 2013-03-28 DIAGNOSIS — I10 Essential (primary) hypertension: Secondary | ICD-10-CM

## 2013-03-28 DIAGNOSIS — I2510 Atherosclerotic heart disease of native coronary artery without angina pectoris: Secondary | ICD-10-CM

## 2013-03-28 DIAGNOSIS — M899 Disorder of bone, unspecified: Secondary | ICD-10-CM

## 2013-03-29 ENCOUNTER — Encounter: Payer: Self-pay | Admitting: Cardiology

## 2013-03-29 ENCOUNTER — Ambulatory Visit (INDEPENDENT_AMBULATORY_CARE_PROVIDER_SITE_OTHER): Payer: Medicare Other | Admitting: Cardiology

## 2013-03-29 ENCOUNTER — Encounter: Payer: Self-pay | Admitting: *Deleted

## 2013-03-29 VITALS — BP 106/58 | HR 64 | Ht 64.0 in | Wt 176.1 lb

## 2013-03-29 DIAGNOSIS — I472 Ventricular tachycardia, unspecified: Secondary | ICD-10-CM

## 2013-03-29 DIAGNOSIS — Z950 Presence of cardiac pacemaker: Secondary | ICD-10-CM

## 2013-03-29 DIAGNOSIS — I2589 Other forms of chronic ischemic heart disease: Secondary | ICD-10-CM

## 2013-03-29 DIAGNOSIS — I442 Atrioventricular block, complete: Secondary | ICD-10-CM

## 2013-03-29 DIAGNOSIS — I5022 Chronic systolic (congestive) heart failure: Secondary | ICD-10-CM

## 2013-03-29 DIAGNOSIS — I255 Ischemic cardiomyopathy: Secondary | ICD-10-CM

## 2013-03-29 LAB — PACEMAKER DEVICE OBSERVATION
AL AMPLITUDE: 3.9 mv
AL IMPEDENCE PM: 400 Ohm
ATRIAL PACING PM: 94
BATTERY VOLTAGE: 2.9 V

## 2013-03-29 NOTE — Progress Notes (Signed)
ELECTROPHYSIOLOGY OFFICE NOTE  Patient ID: [name] MRN: [id number], DOB/AGE: [birth year]   Date of Visit: 03/30/2013  Primary Physician: Blanchie Serve, MD Primary Cardiologist: Terrence Dupont, MD / Caryl Comes, MD Reason for Visit: EP/device follow-up  History of Present Illness  [name] is a pleasant 79 year old woman with CHB s/p BiV PPM implant 3 months ago, ischemic CM, EF 40%, CAD s/p prior MI, CABG, HTN, OSA, CKD and HTN who presents today for routine electrophysiology followup. She is accompanied by her granddaughter. She reports she is doing well and has no cardiac complaints. Today, she denies chest pain or shortness of breath. She denies palpitations, dizziness, near syncope or syncope. She denies LE swelling, orthopnea, PND or recent weight gain. [name] states that she is compliant and tolerating medications without difficulty.  Past Medical History Past Medical History  Diagnosis Date  . Coronary artery disease   . Celiac disease   . Heart disease   . Arthritis   . Glaucoma   . Hypercholesterolemia   . Hypertension   . Stroke   . Shortness of breath   . Diabetes mellitus     INSULIN DEPENDENT  . GERD (gastroesophageal reflux disease)   . Headache(784.0)   . Anemia   . Sleep apnea     uses cpap  . Unspecified constipation   . Acute kidney failure, unspecified   . Iron deficiency anemia, unspecified   . CHF (congestive heart failure)   . Ischemic cardiomyopathy   . Chronic systolic CHF (congestive heart failure)   . Complete heart block     Past Surgical History Past Surgical History  Procedure Laterality Date  . Cardiac surgery    . Back surgery    . Cardiac catheterization  09/02/2012  . Coronary angioplasty with stent placement  09/02/2012    RCA  . Bi-ventricular pacemaker insertion (crt-p)  March 2014    [facility]    Allergies/Intolerances No Known Allergies  Current Home Medications Current Outpatient Prescriptions  Medication  Sig Dispense Refill  . aspirin EC 81 MG tablet Take 81 mg by mouth daily.      Marland Kitchen atorvastatin (LIPITOR) 40 MG tablet Take 80 mg by mouth daily.       Marland Kitchen BAYER CONTOUR TEST test strip       . carvedilol (COREG) 3.125 MG tablet Take 1 tablet (3.125 mg total) by mouth 2 (two) times daily with a meal.  60 tablet  3  . clopidogrel (PLAVIX) 75 MG tablet Take 75 mg by mouth daily.      . COMBIGAN 0.2-0.5 % ophthalmic solution Place 1 drop into both eyes every 12 (twelve) hours.       . docusate sodium 100 MG CAPS Take 100 mg by mouth 2 (two) times daily.  60 capsule  0  . esomeprazole (NEXIUM) 40 MG capsule Take 40 mg by mouth daily before breakfast.      . ferrous sulfate 325 (65 FE) MG tablet Take 325 mg by mouth 2 (two) times daily.       . insulin detemir (LEVEMIR) 100 UNIT/ML injection Inject 40 Units into the skin every morning.   10 mL  0  . latanoprost (XALATAN) 0.005 % ophthalmic solution INSTILL 1 DROP INTO BOTH EYES AT BEDTIME  2.5 mL  0  . Multiple Vitamin (MULTIVITAMIN WITH MINERALS) TABS Take 1 tablet by mouth daily.      . nitroGLYCERIN (NITROSTAT) 0.4 MG SL tablet Place 0.4 mg under  the tongue every 5 (five) minutes as needed. Chest pains      . NOVOFINE 32G X 6 MM MISC       . polyethylene glycol powder (GLYCOLAX/MIRALAX) powder Take 17 g by mouth daily. Hold for loose stool  3350 g  1  . traMADol (ULTRAM) 50 MG tablet Take 1 tablet (50 mg total) by mouth every 6 (six) hours as needed for pain. Take one tablet every 6 hours as needed for pain  120 tablet  0  . valsartan-hydrochlorothiazide (DIOVAN-HCT) 320-25 MG per tablet Take by mouth daily. One half tablet daily      . senna-docusate (SENOKOT S) 8.6-50 MG per tablet Take 1 tablet by mouth daily. Start with 1 tablet twice a day and if this does not provide desired effect in 1 week, increase to 2 tab twice a day  180 tablet  3   No current facility-administered medications for this visit.   Social History Social History  . Marital  Status: Widowed   Occupational History  . Retired    Social History Main Topics  . Smoking status: Former Smoker    Quit date: 12/26/1976  . Smokeless tobacco: Never Used  . Alcohol Use: No  . Drug Use: No   Review of Systems General: No chills, fever, night sweats or weight changes Cardiovascular: No chest pain, dyspnea on exertion, edema, orthopnea, palpitations, paroxysmal nocturnal dyspnea Dermatological: No rash, lesions or masses Respiratory: No cough, dyspnea Urologic: No hematuria, dysuria Abdominal: +constipation  No nausea, vomiting, diarrhea, bright red blood per rectum, melena, or hematemesis Neurologic: No visual changes, weakness, changes in mental status All other systems reviewed and are otherwise negative except as noted above.  Physical Exam Blood pressure 106/58, pulse 64, height 5' 4"  (1.626 m), weight 176 lb 1.9 oz (79.888 kg).  General: Well developed, well appearing 79 year old female in no acute distress. HEENT: Normocephalic, atraumatic. EOMs intact. Sclera nonicteric. Oropharynx clear.  Neck: Supple without bruits. No JVD. Lungs: Respirations regular and unlabored, CTA bilaterally. No wheezes, rales or rhonchi. Heart: RRR. S1, S2 present. No murmurs, rub, S3 or S4. Abdomen: Soft, non-distended.  Extremities: No clubbing, cyanosis or edema. DP/PT/Radials 2+ and equal bilaterally. Psych: Normal affect. Neuro: Alert and oriented X 3. Moves all extremities spontaneously.   Diagnostics Device interrogation today - Normal device function. Thresholds, sensing, impedance consistent with previous measurements. Histograms appropriate for patient and level of activity. 567 AMS episodes, available EGMs reviewed and not AT/AF. 2 high ventricular high rate episodes, longest 12 beats in duration, EGMs reviewed and show NSVT. Patient bi-ventricularly pacing >99% of the time. Device programmed to chronic output settings with appropriate safety margins. Device heart failure  diagnostics are within normal limits and stable over time. Estimated longevity 3.8 - 4.2 years.  Assessment and Plan 1. CHB s/p BiV PPM implant 3 months ago Normal device function Reprogrammed outputs to chronic settings with appropriate safety margins and will keep an eye on battery longevity; otherwise no programming changes made Return for BiV PPM check in device clinic in 3 months Return for follow-up with Dr. Caryl Comes in 6 months 2. Ischemic CM, EF 40%, with chronic systolic HF Stable; euvolemic by exam today and denies HF symptoms Continue medical therapy 3. Paroxysmal VT There are 2 VHR episodes on device interrogation today; EGMs show brief NSVT Asymptomatic Continue BB  Signed, Ethelreda Sukhu, PA-C 03/30/2013, 4:21 PM

## 2013-03-30 ENCOUNTER — Ambulatory Visit (INDEPENDENT_AMBULATORY_CARE_PROVIDER_SITE_OTHER): Payer: Medicare Other | Admitting: Internal Medicine

## 2013-03-30 ENCOUNTER — Encounter: Payer: Self-pay | Admitting: Internal Medicine

## 2013-03-30 ENCOUNTER — Encounter: Payer: Self-pay | Admitting: Cardiology

## 2013-03-30 VITALS — BP 114/62 | HR 77 | Temp 98.3°F | Resp 14 | Ht 64.0 in | Wt 176.6 lb

## 2013-03-30 DIAGNOSIS — E7800 Pure hypercholesterolemia, unspecified: Secondary | ICD-10-CM

## 2013-03-30 DIAGNOSIS — I2510 Atherosclerotic heart disease of native coronary artery without angina pectoris: Secondary | ICD-10-CM

## 2013-03-30 DIAGNOSIS — S129XXS Fracture of neck, unspecified, sequela: Secondary | ICD-10-CM

## 2013-03-30 DIAGNOSIS — G4733 Obstructive sleep apnea (adult) (pediatric): Secondary | ICD-10-CM

## 2013-03-30 DIAGNOSIS — Z1231 Encounter for screening mammogram for malignant neoplasm of breast: Secondary | ICD-10-CM

## 2013-03-30 DIAGNOSIS — E119 Type 2 diabetes mellitus without complications: Secondary | ICD-10-CM

## 2013-03-30 DIAGNOSIS — K5900 Constipation, unspecified: Secondary | ICD-10-CM

## 2013-03-30 DIAGNOSIS — I10 Essential (primary) hypertension: Secondary | ICD-10-CM

## 2013-03-30 DIAGNOSIS — IMO0002 Reserved for concepts with insufficient information to code with codable children: Secondary | ICD-10-CM

## 2013-03-30 LAB — COMPREHENSIVE METABOLIC PANEL
Albumin/Globulin Ratio: 1.5 (ref 1.1–2.5)
Albumin: 3.9 g/dL (ref 3.5–4.7)
BUN/Creatinine Ratio: 14 (ref 11–26)
BUN: 16 mg/dL (ref 8–27)
GFR calc Af Amer: 51 mL/min/{1.73_m2} — ABNORMAL LOW (ref >59)
GFR calc non Af Amer: 44 mL/min/{1.73_m2} — ABNORMAL LOW (ref >59)
Globulin, Total: 2.6 g/dL (ref 1.5–4.5)
Glucose: 192 mg/dL — ABNORMAL HIGH (ref 65–99)
Total Bilirubin: 0.2 mg/dL (ref 0.0–1.2)
Total Protein: 6.5 g/dL (ref 6.0–8.5)

## 2013-03-30 LAB — CBC WITH DIFFERENTIAL/PLATELET
Basos: 0 % (ref 0–3)
Eosinophils Absolute: 0.1 10*3/uL (ref 0.0–0.4)
Immature Grans (Abs): 0 10*3/uL (ref 0.0–0.1)
Lymphs: 39 % (ref 14–46)
MCH: 27.8 pg (ref 26.6–33.0)
Monocytes: 7 % (ref 4–12)
Neutrophils Relative %: 52 % (ref 40–74)
RBC: 3.99 x10E6/uL (ref 3.77–5.28)
WBC: 5.4 10*3/uL (ref 3.4–10.8)

## 2013-03-30 LAB — LIPID PANEL
Chol/HDL Ratio: 4.4 ratio units (ref 0.0–4.4)
LDL Calculated: 125 mg/dL — ABNORMAL HIGH (ref 0–99)
VLDL Cholesterol Cal: 25 mg/dL (ref 5–40)

## 2013-03-30 LAB — HEMOGLOBIN A1C
Est. average glucose Bld gHb Est-mCnc: 192 mg/dL
Hgb A1c MFr Bld: 8.3 % — ABNORMAL HIGH (ref 4.8–5.6)

## 2013-03-30 MED ORDER — SENNOSIDES-DOCUSATE SODIUM 8.6-50 MG PO TABS: 1.0000 | ORAL_TABLET | Freq: Every day | ORAL | Status: DC
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
## 2013-04-13 ENCOUNTER — Encounter: Payer: Self-pay | Admitting: Internal Medicine

## 2013-04-25 ENCOUNTER — Other Ambulatory Visit (HOSPITAL_COMMUNITY): Payer: Self-pay | Admitting: Podiatry

## 2013-04-25 ENCOUNTER — Other Ambulatory Visit: Payer: Self-pay | Admitting: Internal Medicine

## 2013-04-25 DIAGNOSIS — M79609 Pain in unspecified limb: Secondary | ICD-10-CM

## 2013-04-25 DIAGNOSIS — B351 Tinea unguium: Secondary | ICD-10-CM | POA: Insufficient documentation

## 2013-04-26 ENCOUNTER — Ambulatory Visit (HOSPITAL_COMMUNITY)
Admission: RE | Admit: 2013-04-26 | Discharge: 2013-04-26 | Disposition: A | Discharge status: Home / Self Care | Payer: Medicare Other | Source: Ambulatory Visit | Attending: Cardiovascular Disease | Admitting: Cardiovascular Disease

## 2013-04-26 DIAGNOSIS — M79609 Pain in unspecified limb: Secondary | ICD-10-CM | POA: Insufficient documentation

## 2013-04-26 DIAGNOSIS — I70219 Atherosclerosis of native arteries of extremities with intermittent claudication, unspecified extremity: Secondary | ICD-10-CM

## 2013-04-26 NOTE — Progress Notes (Signed)
Lower Extremity Arterial Duplex Completed. [name]

## 2013-04-28 ENCOUNTER — Ambulatory Visit: Payer: Medicare Other

## 2013-05-11 ENCOUNTER — Ambulatory Visit (INDEPENDENT_AMBULATORY_CARE_PROVIDER_SITE_OTHER): Payer: Medicare Other | Admitting: Internal Medicine

## 2013-05-11 ENCOUNTER — Encounter: Payer: Self-pay | Admitting: Internal Medicine

## 2013-05-11 VITALS — BP 142/82 | HR 70 | Temp 98.0°F | Resp 14 | Ht 64.0 in | Wt 174.6 lb

## 2013-05-11 DIAGNOSIS — I2510 Atherosclerotic heart disease of native coronary artery without angina pectoris: Secondary | ICD-10-CM

## 2013-05-11 DIAGNOSIS — I10 Essential (primary) hypertension: Secondary | ICD-10-CM

## 2013-05-11 DIAGNOSIS — E119 Type 2 diabetes mellitus without complications: Secondary | ICD-10-CM

## 2013-05-11 DIAGNOSIS — E7800 Pure hypercholesterolemia, unspecified: Secondary | ICD-10-CM

## 2013-05-11 DIAGNOSIS — K5900 Constipation, unspecified: Secondary | ICD-10-CM

## 2013-05-11 MED ORDER — LUBIPROSTONE 24 MCG PO CAPS: ORAL_CAPSULE | ORAL | Status: DC
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
## 2013-06-08 ENCOUNTER — Ambulatory Visit: Payer: Medicare Other | Admitting: Internal Medicine

## 2013-06-08 ENCOUNTER — Other Ambulatory Visit: Payer: Self-pay | Admitting: Geriatric Medicine

## 2013-06-08 ENCOUNTER — Encounter: Payer: Self-pay | Admitting: Internal Medicine

## 2013-06-08 ENCOUNTER — Ambulatory Visit (INDEPENDENT_AMBULATORY_CARE_PROVIDER_SITE_OTHER): Payer: Medicare Other | Admitting: Internal Medicine

## 2013-06-08 VITALS — BP 154/84 | HR 79 | Temp 98.1°F | Resp 14 | Ht 64.0 in | Wt 173.4 lb

## 2013-06-08 DIAGNOSIS — E119 Type 2 diabetes mellitus without complications: Secondary | ICD-10-CM

## 2013-06-08 DIAGNOSIS — M1710 Unilateral primary osteoarthritis, unspecified knee: Secondary | ICD-10-CM | POA: Insufficient documentation

## 2013-06-08 DIAGNOSIS — K5900 Constipation, unspecified: Secondary | ICD-10-CM

## 2013-06-08 MED ORDER — LATANOPROST 0.005 % OP SOLN: 1.0000 [drp] | Freq: Every day | OPHTHALMIC | Status: DC

## 2013-06-08 MED ORDER — LUBIPROSTONE 24 MCG PO CAPS: 24.0000 ug | ORAL_CAPSULE | Freq: Two times a day (BID) | ORAL | Status: DC

## 2013-06-08 NOTE — Progress Notes (Signed)
Patient ID: [name], female   DOB: Jun 09, 1928, 79 y.o.   MRN: [id number]  HPI 79 y/o female patient is here for routine follow up. She has chronic medical issues DM, CAD and follows with cardiology and endocrinology. Reviewed her labs with patient today. Her constipation has improved. BP has been well controlled at home. Blood sugar below 200. No hypoglycemic episodes. Remains chest pain free  Review of Systems Constitutional: Negative for fever, chills, diaphoresis and appetite change.   HENT: Negative for hearing loss, congestion, rhinorrhea, mouth sores and ear discharge.    Eyes: Negative for visual disturbance.   Respiratory: Negative for cough, chest tightness and shortness of breath.    Cardiovascular: Negative for chest pain, palpitations and leg swelling.   Gastrointestinal: Positive for constipation. Negative for abdominal pain, blood in stool and abdominal distention.   Genitourinary: Negative for dysuria and frequency.   Musculoskeletal: Positive for arthralgias. Negative for gait problem.  Skin: Negative for color change, pallor, rash and wound.   Neurological: Negative for tremors, seizures, syncope, weakness, light-headedness and headaches.   Hematological: Negative for adenopathy.   Psychiatric/Behavioral: Negative for behavioral problems and agitation.    Exam BP 154/84  Pulse 79  Temp(Src) 98.1 F (36.7 C) (Oral)  Resp 14  Ht 5' 4"  (1.626 m)  Wt 173 lb 6.4 oz (78.654 kg)  BMI 29.75 kg/m2  Constitutional: She is oriented to person, place, and time. She appears well-developed and well-nourished. No distress.   Neck: Neck supple.  Cardiovascular: Normal rate, regular rhythm and normal heart sounds.    No murmur heard. Pulmonary/Chest: Effort normal and breath sounds normal. No wheeze, rhonchi or crackles.   Abdominal: Soft. Bowel sounds are normal. She exhibits no distension. There is no tenderness.  Musculoskeletal: Normal range of motion. She exhibits no  edema.  Neurological: She is alert and oriented to person, place, and time.  Skin: Skin is warm and dry. She is not diaphoretic. Has a lipoma 6 x 2 cm in right supraclavicular area. Has hypopigmented patches on the skin in abdominal area Psychiatric: She has a normal mood and affect. Her behavior is normal. She is oriented to person, place and time   Assessment/plan  HTN- bp has been normal recently. On baby aspirin, statin with coreg 3.125 bid with diovan-hct 320-25 daily. No headaches, blurry vision, chest pain or SOB. Continue current medication  DM- Compliant with her medication. No hypoglycemic episodes. Continue asa, statin, levemir. Monitor cbg  Constipation- Baker Pierini has been helpful. Continue this and docusate for now

## 2013-06-09 LAB — MICROALBUMIN / CREATININE URINE RATIO
Creatinine, Ur: 56.9 mg/dL (ref 15.0–278.0)
MICROALB/CREAT RATIO: 24.4 mg/g creat (ref 0.0–30.0)

## 2013-06-30 ENCOUNTER — Ambulatory Visit (INDEPENDENT_AMBULATORY_CARE_PROVIDER_SITE_OTHER): Payer: Medicare Other | Admitting: *Deleted

## 2013-06-30 DIAGNOSIS — I459 Conduction disorder, unspecified: Secondary | ICD-10-CM

## 2013-06-30 LAB — PACEMAKER DEVICE OBSERVATION
AL AMPLITUDE: 4.2 mv
AL IMPEDENCE PM: 387.5 Ohm
BAMS-0001: 150 {beats}/min
BAMS-0003: 80 {beats}/min
BATTERY VOLTAGE: 2.9178 V
VENTRICULAR PACING PM: 100

## 2013-06-30 NOTE — Progress Notes (Signed)
PPM check in office.

## 2013-07-14 ENCOUNTER — Encounter: Payer: Self-pay | Admitting: Podiatry

## 2013-07-14 DIAGNOSIS — B351 Tinea unguium: Secondary | ICD-10-CM

## 2013-07-25 ENCOUNTER — Ambulatory Visit: Payer: Medicare Other | Admitting: Podiatry

## 2013-07-26 ENCOUNTER — Encounter (INDEPENDENT_AMBULATORY_CARE_PROVIDER_SITE_OTHER): Payer: Self-pay

## 2013-07-26 ENCOUNTER — Ambulatory Visit (INDEPENDENT_AMBULATORY_CARE_PROVIDER_SITE_OTHER): Payer: Medicare Other | Admitting: General Surgery

## 2013-07-26 ENCOUNTER — Encounter (INDEPENDENT_AMBULATORY_CARE_PROVIDER_SITE_OTHER): Payer: Self-pay | Admitting: General Surgery

## 2013-07-26 VITALS — BP 140/80 | HR 72 | Resp 14 | Ht 64.0 in | Wt 174.4 lb

## 2013-07-26 DIAGNOSIS — E041 Nontoxic single thyroid nodule: Secondary | ICD-10-CM

## 2013-07-26 LAB — TSH: TSH: 2.938 u[IU]/mL (ref 0.350–4.500)

## 2013-07-26 NOTE — Patient Instructions (Signed)
Will check thyroid function studies and get u/s biopsy of nodule Will get cardiac clearance

## 2013-07-28 ENCOUNTER — Telehealth (INDEPENDENT_AMBULATORY_CARE_PROVIDER_SITE_OTHER): Payer: Self-pay | Admitting: *Deleted

## 2013-07-28 ENCOUNTER — Encounter: Payer: Self-pay | Admitting: Internal Medicine

## 2013-07-28 NOTE — Telephone Encounter (Signed)
Spoke with Tammy at GI and she is awaiting clearance from Dr. Terrence Dupont to whether pt can come off her plavix to have biopsy done.  Pt is scheduled to see Dr. Terrence Dupont next week and this will be determined.  Will follow up with appt information.

## 2013-08-04 ENCOUNTER — Encounter (INDEPENDENT_AMBULATORY_CARE_PROVIDER_SITE_OTHER): Payer: Self-pay

## 2013-08-10 NOTE — Telephone Encounter (Signed)
On 10/16 we received clearance from Dr. Zenia Resides office for pt to come off of plavix prior to thyroid biopsy.  I followed up with Tami from GI on 10/21 because no appt had been made. She is waiting on patient to contact her back to schedule appt.  She has left a message for pt to return call and was attempting to call again on 10/21.  As of today still no appt has been made.

## 2013-08-10 NOTE — Telephone Encounter (Signed)
LMOM. Received message from [facility] that they finally got her scheduled on 10/30. She is scheduled to see Dr Marlou Starks 10/29. She needs to reschedule that 10/29 appt to after her bx is complete.

## 2013-08-11 ENCOUNTER — Telehealth (INDEPENDENT_AMBULATORY_CARE_PROVIDER_SITE_OTHER): Payer: Self-pay | Admitting: *Deleted

## 2013-08-11 NOTE — Telephone Encounter (Signed)
I called pt to inform her that I changed her appt with Dr. Marlou Starks to 08/31/13 at 1:40pm since her thyroid tests will not be performed until 10/30.  Pt agreeable.

## 2013-08-11 NOTE — Progress Notes (Signed)
Patient ID: [name], female   DOB: 1928/05/24, 79 y.o.   MRN: [id number]  Chief Complaint  Patient presents with  . New Evaluation    eval thyroid mass    HPI [name] is a 79 y.o. female.  We are asked to see the patient in consultation by Dr. Bubba Camp to evaluate her for a thyroid nodule. The patient is an 79 year old female who reports pain in her neck and in the back of her head. This is apparently been going on for several weeks. She also reports dizziness. As part of her workup she had a CT scan of her neck that showed a nodule in her thyroid gland. She has had no further workup of her thyroid gland. She denies any difficulty swallowing solids or liquids  HPI  Past Medical History  Diagnosis Date  . Coronary artery disease   . Celiac disease   . Heart disease   . Arthritis   . Glaucoma   . Hypercholesterolemia   . Hypertension   . Stroke   . Shortness of breath   . Diabetes mellitus     INSULIN DEPENDENT  . GERD (gastroesophageal reflux disease)   . Headache(784.0)   . Anemia   . Sleep apnea     uses cpap  . Unspecified constipation   . Acute kidney failure, unspecified   . Iron deficiency anemia, unspecified   . CHF (congestive heart failure)   . Ischemic cardiomyopathy   . Chronic systolic CHF (congestive heart failure)   . Complete heart block     Past Surgical History  Procedure Laterality Date  . Cardiac surgery    . Back surgery    . Cardiac catheterization  09/02/2012  . Coronary angioplasty with stent placement  09/02/2012    RCA  . Bi-ventricular pacemaker insertion (crt-p)  March 2014    [facility]    Family History  Problem Relation Age of Onset  . Diabetes Mother   . Hypertension Mother   . Hyperlipidemia Mother   . Cancer Sister   . Dementia Sister   . Neuropathy Sister     Social History History  Substance Use Topics  . Smoking status: Former Smoker    Quit date: 12/26/1976  . Smokeless tobacco: Never Used  .  Alcohol Use: No    No Known Allergies  Current Outpatient Prescriptions  Medication Sig Dispense Refill  . aspirin EC 81 MG tablet Take 81 mg by mouth daily.      Marland Kitchen atorvastatin (LIPITOR) 40 MG tablet Take 80 mg by mouth daily.       Marland Kitchen BAYER CONTOUR TEST test strip       . carvedilol (COREG) 3.125 MG tablet Take 1 tablet (3.125 mg total) by mouth 2 (two) times daily with a meal.  60 tablet  3  . clopidogrel (PLAVIX) 75 MG tablet Take 75 mg by mouth daily.      . COMBIGAN 0.2-0.5 % ophthalmic solution Place 1 drop into both eyes every 12 (twelve) hours.       . docusate sodium 100 MG CAPS Take 100 mg by mouth 2 (two) times daily.  60 capsule  0  . ferrous sulfate 325 (65 FE) MG tablet Take 325 mg by mouth 2 (two) times daily.       . insulin aspart (NOVOLOG) 100 UNIT/ML injection Inject 10 Units into the skin 2 (two) times daily.      . insulin detemir (LEVEMIR) 100 UNIT/ML injection  Inject 40 Units into the skin every morning.   10 mL  0  . latanoprost (XALATAN) 0.005 % ophthalmic solution Place 1 drop into both eyes at bedtime.  2.5 mL  5  . lubiprostone (AMITIZA) 24 MCG capsule Take 1 capsule (24 mcg total) by mouth 2 (two) times daily with a meal.  90 capsule  3  . Multiple Vitamin (MULTIVITAMIN WITH MINERALS) TABS Take 1 tablet by mouth daily.      . nitroGLYCERIN (NITROSTAT) 0.4 MG SL tablet Place 0.4 mg under the tongue every 5 (five) minutes as needed. Chest pains      . NOVOFINE 32G X 6 MM MISC       . traMADol (ULTRAM) 50 MG tablet Take 1 tablet (50 mg total) by mouth every 6 (six) hours as needed for pain. Take one tablet every 6 hours as needed for pain  120 tablet  0  . valsartan-hydrochlorothiazide (DIOVAN-HCT) 320-25 MG per tablet Take by mouth daily. One half tablet daily      . polyethylene glycol powder (GLYCOLAX/MIRALAX) powder Take 17 g by mouth daily. Hold for loose stool  3350 g  1   No current facility-administered medications for this visit.    Review of  Systems Review of Systems  Constitutional: Negative.   HENT: Negative.   Eyes: Negative.   Respiratory: Negative.   Cardiovascular: Negative.   Gastrointestinal: Negative.   Endocrine: Negative.   Genitourinary: Negative.   Allergic/Immunologic: Negative.   Neurological: Positive for light-headedness.  Hematological: Negative.   Psychiatric/Behavioral: Negative.     Blood pressure 140/80, pulse 72, resp. rate 14, height 5' 4"  (1.626 m), weight 174 lb 6.4 oz (79.107 kg).  Physical Exam Physical Exam  Constitutional: She is oriented to person, place, and time. She appears well-developed and well-nourished.  HENT:  Head: Normocephalic and atraumatic.  Eyes: Conjunctivae and EOM are normal. Pupils are equal, round, and reactive to light.  Neck: Normal range of motion. Neck supple.  It is difficult to palpate any fullness on either side of the thyroid gland. There is no palpable cervical adenopathy.  Cardiovascular: Normal rate, regular rhythm and normal heart sounds.   Pulmonary/Chest: Effort normal and breath sounds normal.  Abdominal: Soft. Bowel sounds are normal.  Musculoskeletal: Normal range of motion.  Neurological: She is alert and oriented to person, place, and time.  Skin: Skin is warm and dry.  Psychiatric: She has a normal mood and affect. Her behavior is normal.    Data Reviewed As above  Assessment    The patient has a nodule in her Right lobe of her thyroid gland.     Plan    At this point I would recommend checking her thyroid function studies. Also recommend a fine-needle aspiration biopsy of the Dominant nodule. We will plan to see her back in 3 weeks ago the results of these studies       TOTH III,PAUL S 08/11/2013, 1:00 PM

## 2013-08-17 ENCOUNTER — Encounter (INDEPENDENT_AMBULATORY_CARE_PROVIDER_SITE_OTHER): Payer: Medicare Other | Admitting: General Surgery

## 2013-08-18 ENCOUNTER — Inpatient Hospital Stay
Admission: RE | Admit: 2013-08-18 | Discharge: 2013-08-18 | Disposition: A | Discharge status: Home / Self Care | Payer: Medicare Other | Source: Ambulatory Visit | Attending: General Surgery | Admitting: General Surgery

## 2013-08-18 ENCOUNTER — Inpatient Hospital Stay: Admission: RE | Admit: 2013-08-18 | Payer: Medicare Other | Source: Ambulatory Visit

## 2013-08-23 ENCOUNTER — Inpatient Hospital Stay: Admission: RE | Admit: 2013-08-23 | Payer: Medicare Other | Source: Ambulatory Visit

## 2013-08-24 ENCOUNTER — Ambulatory Visit
Admission: RE | Admit: 2013-08-24 | Discharge: 2013-08-24 | Disposition: A | Discharge status: Home / Self Care | Payer: Medicare Other | Source: Ambulatory Visit | Attending: General Surgery | Admitting: General Surgery

## 2013-08-24 ENCOUNTER — Other Ambulatory Visit (HOSPITAL_COMMUNITY)
Admission: RE | Admit: 2013-08-24 | Discharge: 2013-08-24 | Disposition: A | Discharge status: Home / Self Care | Payer: Medicare Other | Source: Ambulatory Visit | Attending: Interventional Radiology | Admitting: Interventional Radiology

## 2013-08-24 ENCOUNTER — Other Ambulatory Visit (INDEPENDENT_AMBULATORY_CARE_PROVIDER_SITE_OTHER): Payer: Self-pay | Admitting: General Surgery

## 2013-08-24 DIAGNOSIS — E041 Nontoxic single thyroid nodule: Secondary | ICD-10-CM

## 2013-08-31 ENCOUNTER — Encounter (INDEPENDENT_AMBULATORY_CARE_PROVIDER_SITE_OTHER): Payer: Self-pay

## 2013-08-31 ENCOUNTER — Ambulatory Visit (INDEPENDENT_AMBULATORY_CARE_PROVIDER_SITE_OTHER): Payer: Medicare Other | Admitting: General Surgery

## 2013-08-31 ENCOUNTER — Encounter (INDEPENDENT_AMBULATORY_CARE_PROVIDER_SITE_OTHER): Payer: Self-pay | Admitting: General Surgery

## 2013-08-31 VITALS — BP 126/70 | HR 84 | Temp 98.5°F | Resp 14 | Ht 64.0 in | Wt 173.0 lb

## 2013-08-31 DIAGNOSIS — E041 Nontoxic single thyroid nodule: Secondary | ICD-10-CM

## 2013-08-31 NOTE — Patient Instructions (Signed)
Plan to reimage thyroid gland in 3 months

## 2013-08-31 NOTE — Progress Notes (Signed)
Subjective:     Patient ID: [name], female   DOB: 1928-08-02, 79 y.o.   MRN: [id number]  HPI The patient is an 79 year old black female who was incidentally found to have a mass in her right thyroid lobe. By ultrasound this measured 3.8 cm. She has been asymptomatic from it. Since her last visit we had her undergo a thyroid function studies which were normal. We also had her undergo an FNA of this mass and he came back as a nonneoplastic goiter.  Review of Systems  Constitutional: Negative.   HENT: Negative.   Eyes: Negative.   Respiratory: Negative.   Cardiovascular: Negative.   Gastrointestinal: Negative.   Endocrine: Negative.   Genitourinary: Negative.   Musculoskeletal: Negative.   Skin: Negative.   Allergic/Immunologic: Negative.   Neurological: Negative.   Hematological: Negative.   Psychiatric/Behavioral: Negative.        Objective:   Physical Exam  Constitutional: She is oriented to person, place, and time. She appears well-developed and well-nourished.  HENT:  Head: Normocephalic and atraumatic.  Eyes: Conjunctivae and EOM are normal. Pupils are equal, round, and reactive to light.  Neck: Normal range of motion. Neck supple.  There is some palpable fullness to the right lobe of the thyroid gland. There is no palpable adenopathy in the neck.  Cardiovascular: Normal rate, regular rhythm and normal heart sounds.   Pulmonary/Chest: Effort normal and breath sounds normal.  Abdominal: Soft. Bowel sounds are normal.  Musculoskeletal: Normal range of motion.  Neurological: She is alert and oriented to person, place, and time.  Skin: Skin is warm and dry.  Psychiatric: She has a normal mood and affect. Her behavior is normal.       Assessment:     The patient has a moderate-sized nodule in the right thyroid lobe which was benign by FNA biopsy     Plan:     Since her pathology was benign and she is euthyroid and asymptomatic think it would be reasonable to  observe this mass. I will plan to see her back in 3 months for a repeat exam and ultrasound

## 2013-09-09 ENCOUNTER — Other Ambulatory Visit: Payer: Medicare Other

## 2013-09-14 ENCOUNTER — Encounter: Payer: Self-pay | Admitting: Internal Medicine

## 2013-09-14 ENCOUNTER — Ambulatory Visit (INDEPENDENT_AMBULATORY_CARE_PROVIDER_SITE_OTHER): Payer: Medicare Other | Admitting: Internal Medicine

## 2013-09-14 VITALS — BP 146/78 | HR 76 | Temp 98.1°F | Resp 14 | Wt 172.2 lb

## 2013-09-14 DIAGNOSIS — D509 Iron deficiency anemia, unspecified: Secondary | ICD-10-CM | POA: Insufficient documentation

## 2013-09-14 DIAGNOSIS — E119 Type 2 diabetes mellitus without complications: Secondary | ICD-10-CM

## 2013-09-14 DIAGNOSIS — Z23 Encounter for immunization: Secondary | ICD-10-CM

## 2013-09-14 DIAGNOSIS — I6350 Cerebral infarction due to unspecified occlusion or stenosis of unspecified cerebral artery: Secondary | ICD-10-CM

## 2013-09-14 DIAGNOSIS — I10 Essential (primary) hypertension: Secondary | ICD-10-CM

## 2013-09-14 DIAGNOSIS — I2510 Atherosclerotic heart disease of native coronary artery without angina pectoris: Secondary | ICD-10-CM

## 2013-09-14 DIAGNOSIS — K5900 Constipation, unspecified: Secondary | ICD-10-CM

## 2013-09-14 DIAGNOSIS — H409 Unspecified glaucoma: Secondary | ICD-10-CM

## 2013-09-14 DIAGNOSIS — I639 Cerebral infarction, unspecified: Secondary | ICD-10-CM

## 2013-09-14 DIAGNOSIS — E7800 Pure hypercholesterolemia, unspecified: Secondary | ICD-10-CM

## 2013-09-14 MED ORDER — INSULIN DETEMIR 100 UNIT/ML ~~LOC~~ SOLN: 30.0000 [IU] | SUBCUTANEOUS | Status: DC

## 2013-09-14 NOTE — Progress Notes (Signed)
Patient ID: [name], female   DOB: 02-28-1928, 79 y.o.   MRN: [id number]     Chief Complaint  Patient presents with  . Medical Managment of Chronic Issues    3 month f/u and medication refills  . Immunizations    will get flu shot today   No Known Allergies  HPI 79 y/o female patient is here for routine follow up. She has chronic medical issues DM, CAD and follows with cardiology and endocrinology. She recently had her thyroid nodule biopsied which was benign and with her being euthyroid, will monitor it clinically. She has no concerns this visit. Has not had her influenza vaccine cbg this am was 157 cbg otherwise ranging less than 200 SBP slightly elevated in office today, normal at home as per patient  Review of Systems Constitutional: Negative for fever, chills, diaphoresis and appetite change.   HENT: Negative for hearing loss, congestion, rhinorrhea, mouth sores and ear discharge.    Eyes: Negative for visual disturbance.   Respiratory: Negative for cough, chest tightness and shortness of breath.    Cardiovascular: Negative for chest pain, palpitations and leg swelling.   Gastrointestinal: Negative for abdominal pain, blood in stool and abdominal distention. Has regular bowel movement with amitiza   Genitourinary: Negative for dysuria and frequency.   Musculoskeletal: Positive for arthralgias in her knees. Negative for gait problem.  Skin: Negative for color change, pallor, rash and wound.   Neurological: Negative for tremors, seizures, syncope, weakness, light-headedness and headaches.   Hematological: Negative for adenopathy.   Psychiatric/Behavioral: Negative for behavioral problems and agitation.    Past Medical History  Diagnosis Date  . Coronary artery disease   . Celiac disease   . Heart disease   . Arthritis   . Glaucoma   . Hypercholesterolemia   . Hypertension   . Stroke   . Shortness of breath   . Diabetes mellitus     INSULIN DEPENDENT  . GERD  (gastroesophageal reflux disease)   . Headache(784.0)   . Anemia   . Sleep apnea     uses cpap  . Unspecified constipation   . Acute kidney failure, unspecified   . Iron deficiency anemia, unspecified   . CHF (congestive heart failure)   . Ischemic cardiomyopathy   . Chronic systolic CHF (congestive heart failure)   . Complete heart block    Past Surgical History  Procedure Laterality Date  . Cardiac surgery    . Back surgery    . Cardiac catheterization  09/02/2012  . Coronary angioplasty with stent placement  09/02/2012    RCA  . Bi-ventricular pacemaker insertion (crt-p)  March 2014    [facility]   Current Outpatient Prescriptions on File Prior to Visit  Medication Sig Dispense Refill  . aspirin EC 81 MG tablet Take 81 mg by mouth daily.      Marland Kitchen atorvastatin (LIPITOR) 40 MG tablet Take 80 mg by mouth daily.       Marland Kitchen BAYER CONTOUR TEST test strip       . carvedilol (COREG) 3.125 MG tablet Take 1 tablet (3.125 mg total) by mouth 2 (two) times daily with a meal.  60 tablet  3  . clopidogrel (PLAVIX) 75 MG tablet Take 75 mg by mouth daily.      . COMBIGAN 0.2-0.5 % ophthalmic solution Place 1 drop into both eyes every 12 (twelve) hours.       . docusate sodium 100 MG CAPS Take 100 mg by mouth  2 (two) times daily.  60 capsule  0  . dorzolamide (TRUSOPT) 2 % ophthalmic solution       . ferrous sulfate 325 (65 FE) MG tablet Take 325 mg by mouth 2 (two) times daily.       . insulin aspart (NOVOLOG) 100 UNIT/ML injection Inject 10 Units into the skin 2 (two) times daily.      . insulin detemir (LEVEMIR) 100 UNIT/ML injection Inject 40 Units into the skin every morning.   10 mL  0  . latanoprost (XALATAN) 0.005 % ophthalmic solution Place 1 drop into both eyes at bedtime.  2.5 mL  5  . lubiprostone (AMITIZA) 24 MCG capsule Take 1 capsule (24 mcg total) by mouth 2 (two) times daily with a meal.  90 capsule  3  . Multiple Vitamin (MULTIVITAMIN WITH MINERALS) TABS Take 1 tablet by  mouth daily.      . nitroGLYCERIN (NITROSTAT) 0.4 MG SL tablet Place 0.4 mg under the tongue every 5 (five) minutes as needed. Chest pains      . NOVOFINE 32G X 6 MM MISC       . polyethylene glycol powder (GLYCOLAX/MIRALAX) powder Take 17 g by mouth daily. Hold for loose stool  3350 g  1  . traMADol (ULTRAM) 50 MG tablet Take 1 tablet (50 mg total) by mouth every 6 (six) hours as needed for pain. Take one tablet every 6 hours as needed for pain  120 tablet  0  . valsartan-hydrochlorothiazide (DIOVAN-HCT) 320-25 MG per tablet Take by mouth daily. One half tablet daily       No current facility-administered medications on file prior to visit.    Physical exam  BP 146/78  Pulse 76  Temp(Src) 98.1 F (36.7 C) (Oral)  Resp 14  Wt 172 lb 3.2 oz (78.109 kg)  SpO2 98%  Constitutional: She is oriented to person, place, and time. She appears well-developed and well-nourished. No distress.   Neck: Neck supple. Some palpable fullness on right side of thyroid gland. No adenopathy Cardiovascular: Normal rate, regular rhythm and normal heart sounds.    No murmur heard. Pulmonary/Chest: Effort normal and breath sounds normal. No wheeze, rhonchi or crackles.   Abdominal: Soft. Bowel sounds are normal. She exhibits no distension. There is no tenderness.  Musculoskeletal: Normal range of motion. She exhibits no edema.  Neurological: She is alert and oriented to person, place, and time.   Skin: Skin is warm and dry. She is not diaphoretic. Has a lipoma 6 x 2 cm in right supraclavicular area. Has hypopigmented patches on the skin in abdominal area Psychiatric: She has a normal mood and affect. Her behavior is normal. She is oriented to person, place and time  Labs- CBC    Component Value Date/Time   WBC 5.4 03/28/2013 1021   WBC 6.2 11/29/2012 0530   RBC 3.99 03/28/2013 1021   RBC 3.74* 11/29/2012 0530   HGB 11.1 03/28/2013 1021   HCT 35.1 03/28/2013 1021   PLT 185 11/29/2012 0530   MCV 88 03/28/2013 1021     MCH 27.8 03/28/2013 1021   MCH 28.1 11/29/2012 0530   MCHC 31.6 03/28/2013 1021   MCHC 32.4 11/29/2012 0530   RDW 14.0 03/28/2013 1021   RDW 13.5 11/29/2012 0530   LYMPHSABS 2.1 03/28/2013 1021   LYMPHSABS 2.5 11/28/2012 1832   MONOABS 0.5 11/28/2012 1832   EOSABS 0.1 03/28/2013 1021   EOSABS 0.1 11/28/2012 1832   BASOSABS 0.0 03/28/2013 1021  BASOSABS 0.0 11/28/2012 1832    CMP     Component Value Date/Time   NA 140 03/28/2013 1021   NA 138 11/29/2012 0530   K 3.9 03/28/2013 1021   CL 99 03/28/2013 1021   CO2 25 03/28/2013 1021   GLUCOSE 192* 03/28/2013 1021   GLUCOSE 140* 11/29/2012 0530   BUN 16 03/28/2013 1021   BUN 19 11/29/2012 0530   CREATININE 1.15* 03/28/2013 1021   CALCIUM 9.9 03/28/2013 1021   PROT 6.5 03/28/2013 1021   PROT 7.2 11/28/2012 1832   ALBUMIN 3.3* 11/28/2012 1832   AST 18 03/28/2013 1021   ALT 17 03/28/2013 1021   ALKPHOS 102 03/28/2013 1021   BILITOT 0.2 03/28/2013 1021   GFRNONAA 44* 03/28/2013 1021   GFRAA 51* 03/28/2013 1021    Lab Results  Component Value Date   HGBA1C 8.3* 03/28/2013    Lab Results  Component Value Date   TSH 2.938 07/26/2013    Lipid Panel     Component Value Date/Time   CHOL 249* 11/29/2012 0530   TRIG 126 03/28/2013 1021   HDL 44 03/28/2013 1021   HDL 51 11/29/2012 0530   CHOLHDL 4.4 03/28/2013 1021   CHOLHDL 4.9 11/29/2012 0530   VLDL 24 11/29/2012 0530   LDLCALC 125* 03/28/2013 1021   LDLCALC 174* 11/29/2012 0530   Assessment/plan  Hypertension Well controlled currently. Continue valsartan-hctz 320-25 mg daily, carvedilol 3.125 mg bid. Monitor bp and renal function. Check bmp today. Continue aspirin and statin  Unspecified constipation Encouraged fiber intake. Continue docusate and amitiza for now   CAD (coronary artery disease) S/p stent, remains chest pain free. Continue her  asa, plavix, coreg, valsartan and statin. Continue dietary restriction and walking  Stroke No residual weakness. bp controlled.Will continue plavix,statin, ASA and bp medications. bp under  control  Glaucoma Continue her eye drops, no changes made  Anemia stable. Continue iron supplement and check cbc  Type 2 diabetes mellitus Continue levemir- patient mentions taking 30 u levemir and 10 u novolog bid. Sees dr Milta Deiters. Continue with  ARB, ASA and lipitor. Monitor hypoglycemic episodes. uptodate with eye exam. Normal foot exam. follows up with endocrinology. Will provide influenza vaccine today. Check a1c  Heart block S/p pacemaker. Follows with Dr Terrence Dupont   Labs- a1c, bmp, cbc

## 2013-09-15 LAB — CBC
HCT: 34.9 % (ref 34.0–46.6)
Hemoglobin: 11.2 g/dL (ref 11.1–15.9)
MCH: 28.7 pg (ref 26.6–33.0)
MCHC: 32.1 g/dL (ref 31.5–35.7)
RBC: 3.9 x10E6/uL (ref 3.77–5.28)

## 2013-09-15 LAB — BASIC METABOLIC PANEL
Calcium: 10.8 mg/dL — ABNORMAL HIGH (ref 8.6–10.2)
GFR calc Af Amer: 54 mL/min/{1.73_m2} — ABNORMAL LOW (ref >59)
GFR calc non Af Amer: 47 mL/min/{1.73_m2} — ABNORMAL LOW (ref >59)
Glucose: 162 mg/dL — ABNORMAL HIGH (ref 65–99)
Potassium: 4.2 mmol/L (ref 3.5–5.2)

## 2013-09-15 LAB — HEMOGLOBIN A1C: Hgb A1c MFr Bld: 8.6 % — ABNORMAL HIGH (ref 4.8–5.6)

## 2013-10-03 ENCOUNTER — Ambulatory Visit (INDEPENDENT_AMBULATORY_CARE_PROVIDER_SITE_OTHER): Payer: Medicare Other | Admitting: *Deleted

## 2013-10-03 DIAGNOSIS — I459 Conduction disorder, unspecified: Secondary | ICD-10-CM

## 2013-10-09 LAB — MDC_IDC_ENUM_SESS_TYPE_REMOTE
Brady Statistic AS VS Percent: 1 %
Date Time Interrogation Session: 20141215072613
Implantable Pulse Generator Model: 3210
Implantable Pulse Generator Serial Number: 2882222
Lead Channel Impedance Value: 540 Ohm
Lead Channel Impedance Value: 610 Ohm
Lead Channel Pacing Threshold Amplitude: 0.75 V
Lead Channel Pacing Threshold Amplitude: 1 V
Lead Channel Pacing Threshold Pulse Width: 0.4 ms
Lead Channel Setting Pacing Amplitude: 2.5 V
Lead Channel Setting Pacing Pulse Width: 0.4 ms

## 2013-10-18 ENCOUNTER — Ambulatory Visit: Payer: Medicare Other | Admitting: Internal Medicine

## 2013-10-25 ENCOUNTER — Ambulatory Visit: Payer: Medicare Other | Admitting: Internal Medicine

## 2013-11-02 ENCOUNTER — Encounter: Payer: Self-pay | Admitting: *Deleted

## 2013-11-10 ENCOUNTER — Encounter: Payer: Self-pay | Admitting: Internal Medicine

## 2013-11-28 ENCOUNTER — Ambulatory Visit (INDEPENDENT_AMBULATORY_CARE_PROVIDER_SITE_OTHER): Payer: Medicare Other | Admitting: Neurology

## 2013-11-28 ENCOUNTER — Encounter: Payer: Self-pay | Admitting: Neurology

## 2013-11-28 ENCOUNTER — Telehealth: Payer: Self-pay | Admitting: Neurology

## 2013-11-28 DIAGNOSIS — M542 Cervicalgia: Secondary | ICD-10-CM | POA: Insufficient documentation

## 2013-11-28 MED ORDER — TRAMADOL HCL 50 MG PO TABS: 50.0000 mg | ORAL_TABLET | Freq: Four times a day (QID) | ORAL | Status: DC | PRN
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
## 2013-11-28 NOTE — Telephone Encounter (Signed)
Patient at check out today wants someone to call and explain the bloodwork that Dr. Krista Blue wanted her to do today, patient states it was never mentioned during the visit so she just wants more information. Please call and advise.

## 2013-11-29 ENCOUNTER — Other Ambulatory Visit (INDEPENDENT_AMBULATORY_CARE_PROVIDER_SITE_OTHER): Payer: Self-pay

## 2013-11-29 DIAGNOSIS — Z0289 Encounter for other administrative examinations: Secondary | ICD-10-CM

## 2013-11-29 NOTE — Telephone Encounter (Signed)
Called patient and spoke with patient's daughter [name] concerning why Dr. Krista Blue was having blood work done. I explained to the daughter, according to Dr. Rhea Belton notes that the Dr. Was having a ESR-C reactive protein done to rule out temporal arteritis. Patient's daughter stated that the patient has not had this done and that she would bring her mother back to get this done. I advised the patient's daughter that if the patient has any other problems, questions or concerns to call the office. Patient's daughter verbalized understanding.

## 2013-11-30 ENCOUNTER — Ambulatory Visit (INDEPENDENT_AMBULATORY_CARE_PROVIDER_SITE_OTHER): Payer: Medicare Other | Admitting: General Surgery

## 2013-11-30 ENCOUNTER — Encounter (INDEPENDENT_AMBULATORY_CARE_PROVIDER_SITE_OTHER): Payer: Self-pay | Admitting: General Surgery

## 2013-11-30 VITALS — BP 134/78 | HR 70 | Temp 98.0°F | Resp 18 | Ht 64.0 in | Wt 182.0 lb

## 2013-11-30 DIAGNOSIS — E041 Nontoxic single thyroid nodule: Secondary | ICD-10-CM

## 2013-11-30 LAB — SEDIMENTATION RATE: Sed Rate: 20 mm/h (ref 0–40)

## 2013-11-30 LAB — C-REACTIVE PROTEIN: CRP: 8.6 mg/L — ABNORMAL HIGH (ref 0.0–4.9)

## 2013-11-30 NOTE — Patient Instructions (Signed)
Will repeat u/s of thyroid

## 2013-11-30 NOTE — Progress Notes (Signed)
Subjective:     Patient ID: [name], female   DOB: [birth year], 80 y.o.   MRN: [id number]  HPI The patient is an 81 year old black female who we saw about 3 months ago for an incidentally found right thyroid nodule. At that time she was asymptomatic. The nodule in the right lobe measured 3.8 cm. The last 3 months have been good for her. She has had no difficulties with swallowing or hoarseness. She is able to eat everything she wants.  Review of Systems  Constitutional: Negative.   HENT: Negative.   Eyes: Negative.   Respiratory: Negative.   Cardiovascular: Negative.   Gastrointestinal: Negative.   Endocrine: Negative.   Genitourinary: Negative.   Musculoskeletal: Negative.   Skin: Negative.   Allergic/Immunologic: Negative.   Neurological: Negative.   Hematological: Negative.   Psychiatric/Behavioral: Negative.        Objective:   Physical Exam  Constitutional: She is oriented to person, place, and time. She appears well-developed and well-nourished.  HENT:  Head: Normocephalic and atraumatic.  Eyes: Conjunctivae and EOM are normal. Pupils are equal, round, and reactive to light.  Neck: Normal range of motion. Neck supple.  There is no roll palpable fullness on either side of her neck. There is no palpable lymphadenopathy. Her voice is normal.  Cardiovascular: Normal rate, regular rhythm and normal heart sounds.   Pulmonary/Chest: Effort normal and breath sounds normal.  Abdominal: Soft. Bowel sounds are normal.  Musculoskeletal: Normal range of motion.  Lymphadenopathy:    She has no cervical adenopathy.  Neurological: She is alert and oriented to person, place, and time.  Skin: Skin is warm and dry.  Psychiatric: She has a normal mood and affect. Her behavior is normal.       Assessment:     The patient has a right thyroid nodule that was biopsied and felt to be benign. She continues to be asymptomatic.    Plan:     At this point we will repeat the  ultrasound of her neck and If hernodule has not changed and we will plan to see her back in about 6 months.

## 2013-12-01 ENCOUNTER — Telehealth (INDEPENDENT_AMBULATORY_CARE_PROVIDER_SITE_OTHER): Payer: Self-pay | Admitting: *Deleted

## 2013-12-01 NOTE — Telephone Encounter (Signed)
I spoke with pts daughter and informed her of pts appt for her thyroid US at [facility] on 12/05/13 with an arrival time of 2:00pm.  She is agreeable with this appt.

## 2013-12-05 ENCOUNTER — Other Ambulatory Visit: Payer: Medicare Other

## 2013-12-07 ENCOUNTER — Encounter: Payer: Self-pay | Admitting: *Deleted

## 2013-12-12 ENCOUNTER — Ambulatory Visit (INDEPENDENT_AMBULATORY_CARE_PROVIDER_SITE_OTHER): Payer: Medicare Other | Admitting: Podiatry

## 2013-12-12 ENCOUNTER — Encounter: Payer: Self-pay | Admitting: Podiatry

## 2013-12-12 ENCOUNTER — Ambulatory Visit: Payer: Medicare Other | Admitting: Podiatry

## 2013-12-12 VITALS — BP 141/62 | HR 70 | Resp 12

## 2013-12-12 DIAGNOSIS — B351 Tinea unguium: Secondary | ICD-10-CM

## 2013-12-12 DIAGNOSIS — M79609 Pain in unspecified limb: Secondary | ICD-10-CM

## 2013-12-12 NOTE — Patient Instructions (Signed)
Diabetes and Foot Care Diabetes may cause you to have problems because of poor blood supply (circulation) to your feet and legs. This may cause the skin on your feet to become thinner, break easier, and heal more slowly. Your skin may become dry, and the skin may peel and crack. You may also have nerve damage in your legs and feet causing decreased feeling in them. You may not notice minor injuries to your feet that could lead to infections or more serious problems. Taking care of your feet is one of the most important things you can do for yourself.  HOME CARE INSTRUCTIONS  Wear shoes at all times, even in the house. Do not go barefoot. Bare feet are easily injured.  Check your feet daily for blisters, cuts, and redness. If you cannot see the bottom of your feet, use a mirror or ask someone for help.  Wash your feet with warm water (do not use hot water) and mild soap. Then pat your feet and the areas between your toes until they are completely dry. Do not soak your feet as this can dry your skin.  Apply a moisturizing lotion or petroleum jelly (that does not contain alcohol and is unscented) to the skin on your feet and to dry, brittle toenails. Do not apply lotion between your toes.  Trim your toenails straight across. Do not dig under them or around the cuticle. File the edges of your nails with an emery board or nail file.  Do not cut corns or calluses or try to remove them with medicine.  Wear clean socks or stockings every day. Make sure they are not too tight. Do not wear knee-high stockings since they may decrease blood flow to your legs.  Wear shoes that fit properly and have enough cushioning. To break in new shoes, wear them for just a few hours a day. This prevents you from injuring your feet. Always look in your shoes before you put them on to be sure there are no objects inside.  Do not cross your legs. This may decrease the blood flow to your feet.  If you find a minor scrape,  cut, or break in the skin on your feet, keep it and the skin around it clean and dry. These areas may be cleansed with mild soap and water. Do not cleanse the area with peroxide, alcohol, or iodine.  When you remove an adhesive bandage, be sure not to damage the skin around it.  If you have a wound, look at it several times a day to make sure it is healing.  Do not use heating pads or hot water bottles. They may burn your skin. If you have lost feeling in your feet or legs, you may not know it is happening until it is too late.  Make sure your health care provider performs a complete foot exam at least annually or more often if you have foot problems. Report any cuts, sores, or bruises to your health care provider immediately. SEEK MEDICAL CARE IF:   You have an injury that is not healing.  You have cuts or breaks in the skin.  You have an ingrown nail.  You notice redness on your legs or feet.  You feel burning or tingling in your legs or feet.  You have pain or cramps in your legs and feet.  Your legs or feet are numb.  Your feet always feel cold. SEEK IMMEDIATE MEDICAL CARE IF:   There is increasing redness,   swelling, or pain in or around a wound.  There is a red line that goes up your leg.  Pus is coming from a wound.  You develop a fever or as directed by your health care provider.  You notice a bad smell coming from an ulcer or wound. Document Released: 10/03/2000 Document Revised: 06/08/2013 Document Reviewed: 03/15/2013 [employer] Patient Information 2014 [employer].  

## 2013-12-12 NOTE — Progress Notes (Signed)
Patient ID: [name], female   DOB: 01/08/1928, 80 y.o.   MRN: [id number]  This 81 year old black diabetic female presents complaining of painful toenails. Last visit for this service was 04/25/2013 provided by DR. Regal.  Objective: Hypertrophic, incurvated, discolored, brittle toenails x10 and are tender to palpation.  Assessment: Symptomatic onychomycoses x10  Plan: Nails x10 debrided back down to bleeding. Reappoint at three-month intervals

## 2013-12-14 ENCOUNTER — Ambulatory Visit (INDEPENDENT_AMBULATORY_CARE_PROVIDER_SITE_OTHER): Payer: Medicare Other | Admitting: Endocrinology

## 2013-12-14 ENCOUNTER — Encounter: Payer: Self-pay | Admitting: Endocrinology

## 2013-12-14 VITALS — BP 120/72 | HR 85 | Temp 98.6°F | Wt 171.0 lb

## 2013-12-14 DIAGNOSIS — I10 Essential (primary) hypertension: Secondary | ICD-10-CM

## 2013-12-14 DIAGNOSIS — E119 Type 2 diabetes mellitus without complications: Secondary | ICD-10-CM

## 2013-12-14 DIAGNOSIS — T887XXA Unspecified adverse effect of drug or medicament, initial encounter: Secondary | ICD-10-CM

## 2013-12-14 NOTE — Progress Notes (Signed)
Subjective:    Patient ID: [name], female    DOB: August 26, 1928, 80 y.o.   MRN: [id number]  HPI Pt returns for f/u of IDDM (dx'ed 1998; she has mild neuropathy of the lower extremities, and associated CAD and CVA; her last episode of severe hypoglycemia was in 2013, due to a medication error at home--she was therefore changed to a simple qd insulin schedule; she has never had DKA).  no cbg record, but states cbg's vary from 77-300.  It is in general higher as the day goes on.  She takes the levemir qhs.  She takes PRN novolog approx once a week.   Past Medical History  Diagnosis Date  . Coronary artery disease   . Celiac disease   . Heart disease   . Arthritis   . Glaucoma   . Hypercholesterolemia   . Hypertension   . Stroke   . Shortness of breath   . Diabetes mellitus     INSULIN DEPENDENT  . GERD (gastroesophageal reflux disease)   . Headache(784.0)   . Anemia   . Sleep apnea     uses cpap  . Unspecified constipation   . Acute kidney failure, unspecified   . Iron deficiency anemia, unspecified   . CHF (congestive heart failure)   . Ischemic cardiomyopathy   . Chronic systolic CHF (congestive heart failure)   . Complete heart block     Past Surgical History  Procedure Laterality Date  . Cardiac surgery    . Back surgery    . Cardiac catheterization  09/02/2012  . Coronary angioplasty with stent placement  09/02/2012    RCA  . Bi-ventricular pacemaker insertion (crt-p)  March 2014    [facility]    History   Social History  . Marital Status: Widowed    Spouse Name: N/A    Number of Children: 4  . Years of Education: 12   Occupational History  . Retired    Social History Main Topics  . Smoking status: Former Smoker    Quit date: 12/26/1976  . Smokeless tobacco: Never Used  . Alcohol Use: No  . Drug Use: No  . Sexual Activity: No   Other Topics Concern  . Not on file   Social History Narrative   Regular exercise-no   Caffeine Use-yes   Patient lives at home at home with [name] her daughter.   Retired   Right handed   Education 12th   Caffeine one cup of coffee daily    Current Outpatient Prescriptions on File Prior to Visit  Medication Sig Dispense Refill  . aspirin EC 81 MG tablet Take 81 mg by mouth daily.      Marland Kitchen atorvastatin (LIPITOR) 40 MG tablet Take 80 mg by mouth daily.       Marland Kitchen BAYER CONTOUR TEST test strip       . carvedilol (COREG) 3.125 MG tablet Take 1 tablet (3.125 mg total) by mouth 2 (two) times daily with a meal.  60 tablet  3  . clopidogrel (PLAVIX) 75 MG tablet Take 75 mg by mouth daily.      . COMBIGAN 0.2-0.5 % ophthalmic solution Place 1 drop into both eyes every 12 (twelve) hours.       . docusate sodium 100 MG CAPS Take 100 mg by mouth 2 (two) times daily.  60 capsule  0  . dorzolamide (TRUSOPT) 2 % ophthalmic solution       . ferrous sulfate 325 (65 FE)  MG tablet Take 325 mg by mouth 2 (two) times daily.       . insulin detemir (LEVEMIR) 100 UNIT/ML injection Inject 0.3 mLs (30 Units total) into the skin every morning. Provided by Dr Laurance Flatten from endocrinology  10 mL  0  . latanoprost (XALATAN) 0.005 % ophthalmic solution Place 1 drop into both eyes at bedtime.  2.5 mL  5  . lubiprostone (AMITIZA) 24 MCG capsule Take 1 capsule (24 mcg total) by mouth 2 (two) times daily with a meal.  90 capsule  3  . Multiple Vitamin (MULTIVITAMIN WITH MINERALS) TABS Take 1 tablet by mouth daily.      . nitroGLYCERIN (NITROSTAT) 0.4 MG SL tablet Place 0.4 mg under the tongue every 5 (five) minutes as needed. Chest pains      . NOVOFINE 32G X 6 MM MISC       . traMADol (ULTRAM) 50 MG tablet Take 1 tablet (50 mg total) by mouth every 6 (six) hours as needed.  60 tablet  5  . valsartan-hydrochlorothiazide (DIOVAN-HCT) 320-25 MG per tablet Take by mouth daily. One half tablet daily       No current facility-administered medications on file prior to visit.    No Known Allergies  Family History  Problem Relation Age  of Onset  . Diabetes Mother   . Hypertension Mother   . Hyperlipidemia Mother   . Cancer Sister   . Dementia Sister   . Neuropathy Sister     BP 120/72  Pulse 85  Temp(Src) 98.6 F (37 C) (Oral)  Wt 171 lb (77.565 kg)  SpO2 95%   Review of Systems She denies hypoglycemia and weight change.    Objective:   Physical Exam VITAL SIGNS:  See vs page GENERAL: no distress   Lab Results  Component Value Date   HGBA1C 7.9* 12/14/2013      Assessment & Plan:  DM: therapy is limited by her need for a simple insulin schedule.  Control is improved. Noncompliance with f/u appts and cbg monitoring: this complicates the rx of DM.

## 2013-12-14 NOTE — Patient Instructions (Signed)
Please change the levemir to the morning. Please stop taking the novolog. blood tests are being requested for you today.  You will be contacted with results. Please come back for a follow-up appointment in 3 months. check your blood sugar twice a day.  vary the time of day when you check, between before the 3 meals, and at bedtime.  also check if you have symptoms of your blood sugar being too high or too low.  please keep a record of the readings and bring it to your next appointment here.  please call us sooner if your blood sugar goes below 70, or if you have a lot of readings over 200.

## 2013-12-15 LAB — COMPREHENSIVE METABOLIC PANEL
ALBUMIN: 4.1 g/dL (ref 3.5–4.7)
ALK PHOS: 85 IU/L (ref 39–117)
ALT: 15 IU/L (ref 0–32)
AST: 17 IU/L (ref 0–40)
Albumin/Globulin Ratio: 1.2 (ref 1.1–2.5)
BUN / CREAT RATIO: 14 (ref 11–26)
BUN: 18 mg/dL (ref 8–27)
CO2: 24 mmol/L (ref 18–29)
CREATININE: 1.31 mg/dL — AB (ref 0.57–1.00)
Calcium: 10.8 mg/dL — ABNORMAL HIGH (ref 8.7–10.3)
Chloride: 102 mmol/L (ref 97–108)
GFR calc Af Amer: 43 mL/min/{1.73_m2} — ABNORMAL LOW (ref >59)
GFR, EST NON AFRICAN AMERICAN: 38 mL/min/{1.73_m2} — AB (ref >59)
GLOBULIN, TOTAL: 3.4 g/dL (ref 1.5–4.5)
Glucose: 217 mg/dL — ABNORMAL HIGH (ref 65–99)
Potassium: 4.1 mmol/L (ref 3.5–5.2)
SODIUM: 141 mmol/L (ref 134–144)
Total Bilirubin: <0.2 mg/dL (ref 0.0–1.2)
Total Protein: 7.5 g/dL (ref 6.0–8.5)

## 2013-12-15 LAB — CBC WITH DIFFERENTIAL/PLATELET
Basophils Absolute: 0 10*3/uL (ref 0.0–0.2)
Basos: 0 %
EOS: 1 %
Eosinophils Absolute: 0.1 10*3/uL (ref 0.0–0.4)
HCT: 34.1 % (ref 34.0–46.6)
HEMOGLOBIN: 11.2 g/dL (ref 11.1–15.9)
Immature Grans (Abs): 0 10*3/uL (ref 0.0–0.1)
Immature Granulocytes: 0 %
Lymphocytes Absolute: 2.8 10*3/uL (ref 0.7–3.1)
Lymphs: 40 %
MCH: 29.5 pg (ref 26.6–33.0)
MCHC: 32.8 g/dL (ref 31.5–35.7)
MCV: 90 fL (ref 79–97)
MONOCYTES: 5 %
MONOS ABS: 0.4 10*3/uL (ref 0.1–0.9)
NEUTROS ABS: 3.8 10*3/uL (ref 1.4–7.0)
Neutrophils Relative %: 54 %
RBC: 3.8 x10E6/uL (ref 3.77–5.28)
RDW: 13.5 % (ref 12.3–15.4)
WBC: 7.1 10*3/uL (ref 3.4–10.8)

## 2013-12-15 LAB — HEMOGLOBIN A1C
ESTIMATED AVERAGE GLUCOSE: 180 mg/dL
Hgb A1c MFr Bld: 7.9 % — ABNORMAL HIGH (ref 4.8–5.6)

## 2013-12-19 ENCOUNTER — Ambulatory Visit
Admission: RE | Admit: 2013-12-19 | Discharge: 2013-12-19 | Disposition: A | Discharge status: Home / Self Care | Payer: Medicare Other | Source: Ambulatory Visit | Attending: General Surgery | Admitting: General Surgery

## 2013-12-19 DIAGNOSIS — E041 Nontoxic single thyroid nodule: Secondary | ICD-10-CM

## 2013-12-27 ENCOUNTER — Telehealth (INDEPENDENT_AMBULATORY_CARE_PROVIDER_SITE_OTHER): Payer: Self-pay | Admitting: *Deleted

## 2013-12-27 NOTE — Telephone Encounter (Signed)
Patient's daughter called to ask about the [place] results for the patient.  Explained that I would send a message to Dr. Marlou Starks to ask then we will let them know what his interpretation is.  Daughter states understanding and agreeable at this time.

## 2013-12-28 NOTE — Telephone Encounter (Signed)
LM on daughters VM. US shows stable thyroid nodule. Pt to follow up in 6 months.

## 2013-12-30 NOTE — Telephone Encounter (Signed)
Nodule looks unchanged

## 2014-01-09 ENCOUNTER — Encounter: Payer: Self-pay | Admitting: Internal Medicine

## 2014-01-09 ENCOUNTER — Ambulatory Visit (INDEPENDENT_AMBULATORY_CARE_PROVIDER_SITE_OTHER): Payer: Medicare Other | Admitting: Internal Medicine

## 2014-01-09 VITALS — BP 182/82 | HR 70 | Ht 64.0 in | Wt 180.0 lb

## 2014-01-09 DIAGNOSIS — I442 Atrioventricular block, complete: Secondary | ICD-10-CM

## 2014-01-09 DIAGNOSIS — I255 Ischemic cardiomyopathy: Secondary | ICD-10-CM

## 2014-01-09 DIAGNOSIS — Z950 Presence of cardiac pacemaker: Secondary | ICD-10-CM

## 2014-01-09 DIAGNOSIS — I2589 Other forms of chronic ischemic heart disease: Secondary | ICD-10-CM

## 2014-01-09 MED ORDER — CARVEDILOL 6.25 MG PO TABS: 6.2500 mg | ORAL_TABLET | Freq: Two times a day (BID) | ORAL | Status: DC

## 2014-01-09 NOTE — Progress Notes (Signed)
f      Patient Care Team: Blanchie Serve, MD as PCP - General (Internal Medicine)   HPI  [name] is a 80 y.o. female Seen in followup for CRT P. implantation for complete heart block in the setting of ischemic heart disease and ejection fraction of 40% this was done 2/14  She has done relatively well without significant SOB  She has a history of ischemic heart disease with prior bypass surgery. She also has hypertension obstructive sleep apnea and chronic kidney disease. Past Medical History  Diagnosis Date  . Coronary artery disease   . Celiac disease   . Heart disease   . Arthritis   . Glaucoma   . Hypercholesterolemia   . Hypertension   . Stroke   . Shortness of breath   . Diabetes mellitus     INSULIN DEPENDENT  . GERD (gastroesophageal reflux disease)   . Headache(784.0)   . Anemia   . Sleep apnea     uses cpap  . Unspecified constipation   . Acute kidney failure, unspecified   . Iron deficiency anemia, unspecified   . CHF (congestive heart failure)   . Ischemic cardiomyopathy   . Chronic systolic CHF (congestive heart failure)   . Complete heart block     Past Surgical History  Procedure Laterality Date  . Cardiac surgery    . Back surgery    . Cardiac catheterization  09/02/2012  . Coronary angioplasty with stent placement  09/02/2012    RCA  . Bi-ventricular pacemaker insertion (crt-p)  March 2014    [facility]    Current Outpatient Prescriptions  Medication Sig Dispense Refill  . aspirin EC 81 MG tablet Take 81 mg by mouth daily.      Marland Kitchen atorvastatin (LIPITOR) 40 MG tablet Take 80 mg by mouth daily.       Marland Kitchen BAYER CONTOUR TEST test strip       . clopidogrel (PLAVIX) 75 MG tablet Take 75 mg by mouth daily.      . COMBIGAN 0.2-0.5 % ophthalmic solution Place 1 drop into both eyes every 12 (twelve) hours.       . docusate sodium 100 MG CAPS Take 100 mg by mouth 2 (two) times daily.  60 capsule  0  . dorzolamide (TRUSOPT) 2 % ophthalmic  solution       . ferrous sulfate 325 (65 FE) MG tablet Take 325 mg by mouth 2 (two) times daily.       . insulin detemir (LEVEMIR) 100 UNIT/ML injection Inject 0.3 mLs (30 Units total) into the skin every morning. Provided by Dr Laurance Flatten from endocrinology  10 mL  0  . latanoprost (XALATAN) 0.005 % ophthalmic solution Place 1 drop into both eyes at bedtime.  2.5 mL  5  . lubiprostone (AMITIZA) 24 MCG capsule Take 1 capsule (24 mcg total) by mouth 2 (two) times daily with a meal.  90 capsule  3  . Multiple Vitamin (MULTIVITAMIN WITH MINERALS) TABS Take 1 tablet by mouth daily.      . nitroGLYCERIN (NITROSTAT) 0.4 MG SL tablet Place 0.4 mg under the tongue every 5 (five) minutes as needed. Chest pains      . NOVOFINE 32G X 6 MM MISC       . traMADol (ULTRAM) 50 MG tablet Take 1 tablet (50 mg total) by mouth every 6 (six) hours as needed.  60 tablet  5  . valsartan-hydrochlorothiazide (DIOVAN-HCT) 320-25 MG per tablet Take by  mouth daily. One half tablet daily      . carvedilol (COREG) 6.25 MG tablet Take 1 tablet (6.25 mg total) by mouth 2 (two) times daily.  180 tablet  3   No current facility-administered medications for this visit.    No Known Allergies  Review of Systems negative except from HPI and PMH  Physical Exam BP 182/82  Pulse 70  Ht 5' 4"  (1.626 m)  Wt 180 lb (81.647 kg)  BMI 30.88 kg/m2 Well developed and nourished in no acute distress HENT normal Neck supple with JVP-flat Clear Regular rate and rhythm, no murmurs or gallops Abd-soft with active BS No Clubbing cyanosis edema Skin-warm and dry A & Oriented  Grossly normal sensory and motor function    Assessment and  Plan  ICM  S/p CRT-P  [facility]   CHB  HTN  Will increase carvedilol for BP  Euvolemic  conatinue other current meds  F/u with Dr Bubba Camp on Wed as scheduled for further BP refinement

## 2014-01-09 NOTE — Patient Instructions (Signed)
Your physician has recommended you make the following change in your medication: Increase Coreg to 6.34m twice a day.  Your physician recommends that you schedule a follow-up appointment in: 12 months with Dr. KCaryl Comes

## 2014-01-10 LAB — MDC_IDC_ENUM_SESS_TYPE_INCLINIC
Implantable Pulse Generator Serial Number: 2882222
Lead Channel Pacing Threshold Amplitude: 0.75 V
Lead Channel Pacing Threshold Amplitude: 1 V
Lead Channel Pacing Threshold Pulse Width: 0.4 ms
Lead Channel Pacing Threshold Pulse Width: 0.4 ms
Lead Channel Pacing Threshold Pulse Width: 0.4 ms
MDC IDC MSMT LEADCHNL RA PACING THRESHOLD AMPLITUDE: 1 V
MDC IDC MSMT LEADCHNL RA SENSING INTR AMPL: 3.9 mV

## 2014-01-11 ENCOUNTER — Ambulatory Visit: Payer: Medicare Other | Admitting: Internal Medicine

## 2014-01-17 NOTE — Telephone Encounter (Signed)
Pt's daughter called in to get results of test.  Message delivered from below.

## 2014-01-30 ENCOUNTER — Telehealth: Payer: Self-pay | Admitting: Neurology

## 2014-01-30 NOTE — Telephone Encounter (Signed)
Patient's daughter calling to request the results from patient's test on 12/19/13, states she does not know the name of the test. Please call patient and advise.

## 2014-01-30 NOTE — Telephone Encounter (Signed)
Called and left VM message that patient did not have any recent test in our office, maybe check with pcp or Cardiologist(Dr Caryl Comes)

## 2014-03-02 ENCOUNTER — Other Ambulatory Visit: Payer: Self-pay | Admitting: Internal Medicine

## 2014-03-06 ENCOUNTER — Ambulatory Visit: Payer: Medicare Other | Admitting: Podiatry

## 2014-03-27 ENCOUNTER — Encounter: Payer: Self-pay | Admitting: Podiatry

## 2014-03-27 ENCOUNTER — Ambulatory Visit (INDEPENDENT_AMBULATORY_CARE_PROVIDER_SITE_OTHER): Payer: Medicare Other | Admitting: Podiatry

## 2014-03-27 VITALS — BP 152/73 | HR 79 | Resp 18

## 2014-03-27 DIAGNOSIS — B351 Tinea unguium: Secondary | ICD-10-CM

## 2014-03-27 DIAGNOSIS — M79609 Pain in unspecified limb: Secondary | ICD-10-CM

## 2014-03-27 NOTE — Progress Notes (Signed)
Patient ID: [name], female   DOB: [birth year], 80 y.o.   MRN: [id number]  Subjective: Orientated x3 black female presents complaining of painful toenails.  Objective: Incurvated, hypertrophic, discolored toenails x10  Assessment: Symptomatic onychomycoses x10  Plan: Debrided toenails x10 without a bleeding.  Reappoint in 3 months

## 2014-04-11 ENCOUNTER — Ambulatory Visit (INDEPENDENT_AMBULATORY_CARE_PROVIDER_SITE_OTHER): Payer: Medicare Other | Admitting: *Deleted

## 2014-04-11 ENCOUNTER — Encounter: Payer: Self-pay | Admitting: Internal Medicine

## 2014-04-11 DIAGNOSIS — I459 Conduction disorder, unspecified: Secondary | ICD-10-CM

## 2014-04-11 NOTE — Progress Notes (Signed)
Remote pacemaker transmission.   

## 2014-04-17 LAB — MDC_IDC_ENUM_SESS_TYPE_REMOTE
Battery Remaining Percentage: 59 %
Brady Statistic AP VS Percent: 1 %
Brady Statistic AS VP Percent: 2.9 %
Brady Statistic AS VS Percent: 1 %
Brady Statistic RA Percent Paced: 97 %
Date Time Interrogation Session: 20150623063247
Implantable Pulse Generator Serial Number: 2882222
Lead Channel Impedance Value: 340 Ohm
Lead Channel Impedance Value: 540 Ohm
Lead Channel Sensing Intrinsic Amplitude: 4 mV
Lead Channel Setting Pacing Amplitude: 2 V
Lead Channel Setting Pacing Amplitude: 2 V
Lead Channel Setting Sensing Sensitivity: 4 mV
MDC IDC MSMT BATTERY REMAINING LONGEVITY: 53 mo
MDC IDC MSMT BATTERY VOLTAGE: 2.92 V
MDC IDC MSMT LEADCHNL LV IMPEDANCE VALUE: 650 Ohm
MDC IDC SET LEADCHNL LV PACING PULSEWIDTH: 0.4 ms
MDC IDC SET LEADCHNL RV PACING AMPLITUDE: 2.5 V
MDC IDC SET LEADCHNL RV PACING PULSEWIDTH: 0.4 ms
MDC IDC STAT BRADY AP VP PERCENT: 97 %

## 2014-04-25 ENCOUNTER — Encounter: Payer: Self-pay | Admitting: Cardiology

## 2014-05-25 NOTE — Telephone Encounter (Signed)
Noted  

## 2014-05-29 ENCOUNTER — Ambulatory Visit (INDEPENDENT_AMBULATORY_CARE_PROVIDER_SITE_OTHER): Payer: Medicare Other | Admitting: General Surgery

## 2014-05-29 ENCOUNTER — Ambulatory Visit: Payer: Self-pay | Admitting: Adult Health

## 2014-06-01 ENCOUNTER — Other Ambulatory Visit: Payer: Self-pay | Admitting: Internal Medicine

## 2014-06-12 ENCOUNTER — Encounter: Payer: Self-pay | Admitting: Adult Health

## 2014-06-15 ENCOUNTER — Ambulatory Visit (INDEPENDENT_AMBULATORY_CARE_PROVIDER_SITE_OTHER): Payer: Medicare Other | Admitting: General Surgery

## 2014-07-03 ENCOUNTER — Ambulatory Visit: Payer: Medicare Other | Admitting: Podiatry

## 2014-07-06 ENCOUNTER — Ambulatory Visit (INDEPENDENT_AMBULATORY_CARE_PROVIDER_SITE_OTHER): Payer: Medicare Other | Admitting: General Surgery

## 2014-07-17 ENCOUNTER — Ambulatory Visit (INDEPENDENT_AMBULATORY_CARE_PROVIDER_SITE_OTHER): Payer: Medicare Other | Admitting: *Deleted

## 2014-07-17 ENCOUNTER — Encounter: Payer: Self-pay | Admitting: Internal Medicine

## 2014-07-17 DIAGNOSIS — I459 Conduction disorder, unspecified: Secondary | ICD-10-CM

## 2014-07-17 LAB — MDC_IDC_ENUM_SESS_TYPE_REMOTE
Battery Remaining Longevity: 47 mo
Battery Remaining Percentage: 53 %
Battery Voltage: 2.9 V
Brady Statistic AP VS Percent: 1 %
Brady Statistic RA Percent Paced: 97 %
Date Time Interrogation Session: 20150928070610
Implantable Pulse Generator Serial Number: 2882222
Lead Channel Impedance Value: 330 Ohm
Lead Channel Impedance Value: 540 Ohm
Lead Channel Pacing Threshold Amplitude: 1 V
Lead Channel Pacing Threshold Amplitude: 1 V
Lead Channel Pacing Threshold Pulse Width: 0.4 ms
Lead Channel Pacing Threshold Pulse Width: 0.4 ms
Lead Channel Sensing Intrinsic Amplitude: 3.2 mV
Lead Channel Setting Pacing Amplitude: 2 V
Lead Channel Setting Pacing Amplitude: 2.5 V
Lead Channel Setting Pacing Pulse Width: 0.4 ms
Lead Channel Setting Sensing Sensitivity: 4 mV
MDC IDC MSMT LEADCHNL LV IMPEDANCE VALUE: 640 Ohm
MDC IDC MSMT LEADCHNL RA PACING THRESHOLD PULSEWIDTH: 0.4 ms
MDC IDC MSMT LEADCHNL RV PACING THRESHOLD AMPLITUDE: 0.75 V
MDC IDC MSMT LEADCHNL RV SENSING INTR AMPL: 12 mV
MDC IDC SET LEADCHNL RA PACING AMPLITUDE: 2 V
MDC IDC SET LEADCHNL RV PACING PULSEWIDTH: 0.4 ms
MDC IDC STAT BRADY AP VP PERCENT: 97 %
MDC IDC STAT BRADY AS VP PERCENT: 3.2 %
MDC IDC STAT BRADY AS VS PERCENT: 1 %

## 2014-07-17 NOTE — Progress Notes (Signed)
Remote pacemaker transmission.   

## 2014-07-24 ENCOUNTER — Ambulatory Visit: Payer: Medicare Other | Admitting: Podiatry

## 2014-08-08 ENCOUNTER — Encounter: Payer: Self-pay | Admitting: Cardiology

## 2014-08-21 ENCOUNTER — Encounter: Payer: Self-pay | Admitting: Podiatry

## 2014-09-28 ENCOUNTER — Encounter (HOSPITAL_COMMUNITY): Payer: Self-pay | Admitting: Cardiology

## 2014-09-30 ENCOUNTER — Other Ambulatory Visit: Payer: Self-pay | Admitting: Internal Medicine

## 2014-10-18 ENCOUNTER — Ambulatory Visit (INDEPENDENT_AMBULATORY_CARE_PROVIDER_SITE_OTHER): Payer: Medicare Other | Admitting: *Deleted

## 2014-10-18 DIAGNOSIS — I442 Atrioventricular block, complete: Secondary | ICD-10-CM

## 2014-10-18 LAB — MDC_IDC_ENUM_SESS_TYPE_REMOTE
Battery Remaining Longevity: 48 mo
Battery Remaining Percentage: 53 %
Brady Statistic AP VP Percent: 96 %
Brady Statistic AP VS Percent: 1 %
Brady Statistic AS VP Percent: 3.7 %
Brady Statistic AS VS Percent: 1 %
Brady Statistic RA Percent Paced: 96 %
Date Time Interrogation Session: 20151230074000
Implantable Pulse Generator Model: 3210
Implantable Pulse Generator Serial Number: 2882222
Lead Channel Impedance Value: 350 Ohm
Lead Channel Impedance Value: 650 Ohm
Lead Channel Pacing Threshold Amplitude: 1 V
Lead Channel Pacing Threshold Amplitude: 1 V
Lead Channel Pacing Threshold Pulse Width: 0.4 ms
Lead Channel Pacing Threshold Pulse Width: 0.4 ms
Lead Channel Pacing Threshold Pulse Width: 0.4 ms
Lead Channel Sensing Intrinsic Amplitude: 12 mV
Lead Channel Sensing Intrinsic Amplitude: 4.2 mV
Lead Channel Setting Pacing Amplitude: 2.5 V
Lead Channel Setting Sensing Sensitivity: 4 mV
MDC IDC MSMT BATTERY VOLTAGE: 2.9 V
MDC IDC MSMT LEADCHNL RV IMPEDANCE VALUE: 580 Ohm
MDC IDC MSMT LEADCHNL RV PACING THRESHOLD AMPLITUDE: 0.75 V
MDC IDC SET LEADCHNL LV PACING AMPLITUDE: 2 V
MDC IDC SET LEADCHNL LV PACING PULSEWIDTH: 0.4 ms
MDC IDC SET LEADCHNL RA PACING AMPLITUDE: 2 V
MDC IDC SET LEADCHNL RV PACING PULSEWIDTH: 0.4 ms

## 2014-10-18 NOTE — Progress Notes (Signed)
Remote pacemaker transmission.   

## 2014-11-03 ENCOUNTER — Encounter: Payer: Self-pay | Admitting: Cardiology

## 2014-11-08 ENCOUNTER — Encounter: Payer: Self-pay | Admitting: Internal Medicine

## 2015-01-21 ENCOUNTER — Encounter (HOSPITAL_COMMUNITY): Payer: Self-pay

## 2015-01-21 ENCOUNTER — Emergency Department (HOSPITAL_COMMUNITY)
Admission: EM | Admit: 2015-01-21 | Discharge: 2015-01-21 | Disposition: A | Discharge status: Home / Self Care | Payer: Medicare Other | Attending: Emergency Medicine | Admitting: Emergency Medicine

## 2015-01-21 DIAGNOSIS — Z8673 Personal history of transient ischemic attack (TIA), and cerebral infarction without residual deficits: Secondary | ICD-10-CM | POA: Diagnosis not present

## 2015-01-21 DIAGNOSIS — Z79899 Other long term (current) drug therapy: Secondary | ICD-10-CM | POA: Insufficient documentation

## 2015-01-21 DIAGNOSIS — E119 Type 2 diabetes mellitus without complications: Secondary | ICD-10-CM | POA: Diagnosis not present

## 2015-01-21 DIAGNOSIS — Z950 Presence of cardiac pacemaker: Secondary | ICD-10-CM | POA: Diagnosis not present

## 2015-01-21 DIAGNOSIS — Z7982 Long term (current) use of aspirin: Secondary | ICD-10-CM | POA: Diagnosis not present

## 2015-01-21 DIAGNOSIS — G4730 Sleep apnea, unspecified: Secondary | ICD-10-CM | POA: Insufficient documentation

## 2015-01-21 DIAGNOSIS — R531 Weakness: Secondary | ICD-10-CM | POA: Insufficient documentation

## 2015-01-21 DIAGNOSIS — I2510 Atherosclerotic heart disease of native coronary artery without angina pectoris: Secondary | ICD-10-CM | POA: Insufficient documentation

## 2015-01-21 DIAGNOSIS — Z87891 Personal history of nicotine dependence: Secondary | ICD-10-CM | POA: Insufficient documentation

## 2015-01-21 DIAGNOSIS — Z9889 Other specified postprocedural states: Secondary | ICD-10-CM | POA: Diagnosis not present

## 2015-01-21 DIAGNOSIS — Z9981 Dependence on supplemental oxygen: Secondary | ICD-10-CM | POA: Diagnosis not present

## 2015-01-21 DIAGNOSIS — H409 Unspecified glaucoma: Secondary | ICD-10-CM | POA: Diagnosis not present

## 2015-01-21 DIAGNOSIS — Z862 Personal history of diseases of the blood and blood-forming organs and certain disorders involving the immune mechanism: Secondary | ICD-10-CM | POA: Diagnosis not present

## 2015-01-21 DIAGNOSIS — I10 Essential (primary) hypertension: Secondary | ICD-10-CM | POA: Insufficient documentation

## 2015-01-21 DIAGNOSIS — Z87448 Personal history of other diseases of urinary system: Secondary | ICD-10-CM | POA: Diagnosis not present

## 2015-01-21 DIAGNOSIS — I5022 Chronic systolic (congestive) heart failure: Secondary | ICD-10-CM | POA: Diagnosis not present

## 2015-01-21 DIAGNOSIS — Z9861 Coronary angioplasty status: Secondary | ICD-10-CM | POA: Insufficient documentation

## 2015-01-21 DIAGNOSIS — M1990 Unspecified osteoarthritis, unspecified site: Secondary | ICD-10-CM | POA: Diagnosis not present

## 2015-01-21 DIAGNOSIS — E780 Pure hypercholesterolemia: Secondary | ICD-10-CM | POA: Insufficient documentation

## 2015-01-21 DIAGNOSIS — Z794 Long term (current) use of insulin: Secondary | ICD-10-CM | POA: Diagnosis not present

## 2015-01-21 DIAGNOSIS — Z7902 Long term (current) use of antithrombotics/antiplatelets: Secondary | ICD-10-CM | POA: Diagnosis not present

## 2015-01-21 DIAGNOSIS — Z8719 Personal history of other diseases of the digestive system: Secondary | ICD-10-CM | POA: Diagnosis not present

## 2015-01-21 LAB — CBC
HCT: 34.8 % — ABNORMAL LOW (ref 36.0–46.0)
HEMOGLOBIN: 11.1 g/dL — AB (ref 12.0–15.0)
MCH: 28 pg (ref 26.0–34.0)
MCHC: 31.9 g/dL (ref 30.0–36.0)
MCV: 87.9 fL (ref 78.0–100.0)
Platelets: 223 10*3/uL (ref 150–400)
RBC: 3.96 MIL/uL (ref 3.87–5.11)
RDW: 13 % (ref 11.5–15.5)
WBC: 6.4 10*3/uL (ref 4.0–10.5)

## 2015-01-21 LAB — URINALYSIS, ROUTINE W REFLEX MICROSCOPIC
Bilirubin Urine: NEGATIVE
Glucose, UA: NEGATIVE mg/dL
Hgb urine dipstick: NEGATIVE
KETONES UR: NEGATIVE mg/dL
Nitrite: NEGATIVE
PROTEIN: NEGATIVE mg/dL
Specific Gravity, Urine: 1.008 (ref 1.005–1.030)
UROBILINOGEN UA: 0.2 mg/dL (ref 0.0–1.0)
pH: 5 (ref 5.0–8.0)

## 2015-01-21 LAB — CBG MONITORING, ED: Glucose-Capillary: 198 mg/dL — ABNORMAL HIGH (ref 70–99)

## 2015-01-21 LAB — URINE MICROSCOPIC-ADD ON

## 2015-01-21 LAB — BASIC METABOLIC PANEL
ANION GAP: 10 (ref 5–15)
BUN: 12 mg/dL (ref 6–23)
CALCIUM: 10.3 mg/dL (ref 8.4–10.5)
CO2: 27 mmol/L (ref 19–32)
CREATININE: 1.12 mg/dL — AB (ref 0.50–1.10)
Chloride: 102 mmol/L (ref 96–112)
GFR calc non Af Amer: 44 mL/min — ABNORMAL LOW (ref >90)
GFR, EST AFRICAN AMERICAN: 51 mL/min — AB (ref >90)
Glucose, Bld: 231 mg/dL — ABNORMAL HIGH (ref 70–99)
POTASSIUM: 4.4 mmol/L (ref 3.5–5.1)
Sodium: 139 mmol/L (ref 135–145)

## 2015-01-21 LAB — TROPONIN I

## 2015-01-21 NOTE — ED Provider Notes (Signed)
CSN: [id number]     Arrival date & time 01/21/15  1132 History   First MD Initiated Contact with Patient 01/21/15 1221     Chief Complaint  Patient presents with  . Fatigue     (Consider location/radiation/quality/duration/timing/severity/associated sxs/prior Treatment) HPI Comments: Patient here with 5 weeks of whole-body weakness just been persistent. Denies any fever or chills. No chest pain or chest pressure. No cough or congestion. Denies any dysuria or hematuria. Has had some hyperglycemia at home with blood sugars in the 300s. Symptoms are persistent throughout the day nothing makes them better or worse. Was recently placed on a new medication for her blood sugar.  The history is provided by the patient and a relative.    Past Medical History  Diagnosis Date  . Coronary artery disease   . Celiac disease   . Heart disease   . Arthritis   . Glaucoma   . Hypercholesterolemia   . Hypertension   . Stroke   . Shortness of breath   . Diabetes mellitus     INSULIN DEPENDENT  . GERD (gastroesophageal reflux disease)   . Headache(784.0)   . Anemia   . Sleep apnea     uses cpap  . Unspecified constipation   . Acute kidney failure, unspecified   . Iron deficiency anemia, unspecified   . CHF (congestive heart failure)   . Ischemic cardiomyopathy   . Chronic systolic CHF (congestive heart failure)   . Complete heart block    Past Surgical History  Procedure Laterality Date  . Cardiac surgery    . Back surgery    . Cardiac catheterization  09/02/2012  . Coronary angioplasty with stent placement  09/02/2012    RCA  . Bi-ventricular pacemaker insertion (crt-p)  March 2014    [facility]  . Percutaneous coronary stent intervention (pci-s) N/A 09/02/2012    Procedure: PERCUTANEOUS CORONARY STENT INTERVENTION (PCI-S);  Surgeon: Clent Demark, MD;  Location: [facility] CATH LAB;  Service: Cardiovascular;  Laterality: N/A;  . Temporary pacemaker insertion N/A 11/28/2012    Procedure:  TEMPORARY PACEMAKER INSERTION;  Surgeon: Clent Demark, MD;  Location: [facility] CATH LAB;  Service: Cardiovascular;  Laterality: N/A;  . Permanent pacemaker insertion N/A 11/29/2012    Procedure: PERMANENT PACEMAKER INSERTION;  Surgeon: Deboraha Sprang, MD;  Location: [facility] CATH LAB;  Service: Cardiovascular;  Laterality: N/A;   Family History  Problem Relation Age of Onset  . Diabetes Mother   . Hypertension Mother   . Hyperlipidemia Mother   . Cancer Sister   . Dementia Sister   . Neuropathy Sister    History  Substance Use Topics  . Smoking status: Former Smoker    Quit date: 12/26/1976  . Smokeless tobacco: Never Used  . Alcohol Use: No   OB History    Gravida Para Term Preterm AB TAB SAB Ectopic Multiple Living   4 4 4       3      Review of Systems  All other systems reviewed and are negative.     Allergies  Review of patient's allergies indicates no known allergies.  Home Medications   Prior to Admission medications   Medication Sig Start Date End Date Taking? Authorizing Provider  aspirin EC 81 MG tablet Take 81 mg by mouth daily.    Historical Provider, MD  atorvastatin (LIPITOR) 40 MG tablet Take 80 mg by mouth daily.     Historical Provider, MD  BAYER CONTOUR TEST test strip  01/24/13   Historical Provider, MD  carvedilol (COREG) 6.25 MG tablet Take 1 tablet (6.25 mg total) by mouth 2 (two) times daily. 01/09/14   Deboraha Sprang, MD  clopidogrel (PLAVIX) 75 MG tablet Take 75 mg by mouth daily.    Historical Provider, MD  COMBIGAN 0.2-0.5 % ophthalmic solution Place 1 drop into both eyes every 12 (twelve) hours.  04/03/12   Historical Provider, MD  docusate sodium 100 MG CAPS Take 100 mg by mouth 2 (two) times daily. 10/22/12   Annita Brod, MD  dorzolamide (TRUSOPT) 2 % ophthalmic solution  07/28/13   Historical Provider, MD  ferrous sulfate 325 (65 FE) MG tablet Take 325 mg by mouth 2 (two) times daily.     Historical Provider, MD  insulin detemir (LEVEMIR) 100 UNIT/ML  injection Inject 0.3 mLs (30 Units total) into the skin every morning. Provided by Dr Laurance Flatten from endocrinology 09/14/13   Blanchie Serve, MD  latanoprost (XALATAN) 0.005 % ophthalmic solution Appointment OVERDUE, place 1 drop into both eyes daily at bedtime 03/02/14   Blanchie Serve, MD  lubiprostone (AMITIZA) 24 MCG capsule Take 1 capsule (24 mcg total) by mouth 2 (two) times daily with a meal. 06/08/13   Blanchie Serve, MD  Multiple Vitamin (MULTIVITAMIN WITH MINERALS) TABS Take 1 tablet by mouth daily.    Historical Provider, MD  nitroGLYCERIN (NITROSTAT) 0.4 MG SL tablet Place 0.4 mg under the tongue every 5 (five) minutes as needed. Chest pains 09/03/12   Charolette Forward, MD  NOVOFINE 32G X 6 MM MISC  01/25/13   Historical Provider, MD  polyethylene glycol powder (GLYCOLAX/MIRALAX) powder TAKE 17 G BY MOUTH DAILY. HOLD FOR LOOSE STOOL 06/01/14   Blanchie Serve, MD  traMADol (ULTRAM) 50 MG tablet Take 1 tablet (50 mg total) by mouth every 6 (six) hours as needed. 11/28/13   Marcial Pacas, MD  valsartan-hydrochlorothiazide (DIOVAN-HCT) 320-25 MG per tablet Take by mouth daily. One half tablet daily    Historical Provider, MD   BP 140/59 mmHg  Pulse 84  Temp(Src) 98 F (36.7 C) (Oral)  Resp 20  SpO2 100% Physical Exam  Constitutional: She is oriented to person, place, and time. She appears well-developed and well-nourished.  Non-toxic appearance. No distress.  HENT:  Head: Normocephalic and atraumatic.  Eyes: Conjunctivae, EOM and lids are normal. Pupils are equal, round, and reactive to light.  Neck: Normal range of motion. Neck supple. No tracheal deviation present. No thyroid mass present.  Cardiovascular: Normal rate, regular rhythm and normal heart sounds.  Exam reveals no gallop.   No murmur heard. Pulmonary/Chest: Effort normal and breath sounds normal. No stridor. No respiratory distress. She has no decreased breath sounds. She has no wheezes. She has no rhonchi. She has no rales.  Abdominal: Soft.  Normal appearance and bowel sounds are normal. She exhibits no distension. There is no tenderness. There is no rebound and no CVA tenderness.  Musculoskeletal: Normal range of motion. She exhibits no edema or tenderness.  Neurological: She is alert and oriented to person, place, and time. She has normal strength. No cranial nerve deficit or sensory deficit. GCS eye subscore is 4. GCS verbal subscore is 5. GCS motor subscore is 6.  Skin: Skin is warm and dry. No abrasion and no rash noted.  Psychiatric: She has a normal mood and affect. Her speech is normal and behavior is normal.  Nursing note and vitals reviewed.   ED Course  Procedures (including critical care time) Labs Review  Labs Reviewed  CBC - Abnormal; Notable for the following:    Hemoglobin 11.1 (*)    HCT 34.8 (*)    All other components within normal limits  CBG MONITORING, ED - Abnormal; Notable for the following:    Glucose-Capillary 198 (*)    All other components within normal limits  BASIC METABOLIC PANEL  URINALYSIS, ROUTINE W REFLEX MICROSCOPIC  TROPONIN I    Imaging Review No results found.   EKG Interpretation   Date/Time:  Sunday January 21 2015 11:52:38 EDT Ventricular Rate:  72 PR Interval:  202 QRS Duration: 124 QT Interval:  432 QTC Calculation: 473 R Axis:   157 Text Interpretation:  AV dual-paced rhythm Biventricular pacemaker  detected Abnormal ECG No significant change since last tracing Confirmed  by Tanysha Quant  MD, Alexandro Line ([id number]) on 01/21/2015 12:29:43 PM      MDM   Final diagnoses:  None    Patient is working appear positive for mild hyperglycemia with a negative urinalysis. Will follow-up with her doctor in the office    Lacretia Leigh, MD 01/21/15 1439

## 2015-01-21 NOTE — ED Notes (Signed)
Pt reports increase fatigue x "at least 5 weeks." Denies dizziness or CP. States "I just don't have any energy." Neuro intact. AO x4. Denies pain.

## 2015-01-21 NOTE — Discharge Instructions (Signed)
Fatigue Fatigue is a feeling of tiredness, lack of energy, lack of motivation, or feeling tired all the time. Having enough rest, good nutrition, and reducing stress will normally reduce fatigue. Consult your caregiver if it persists. The nature of your fatigue will help your caregiver to find out its cause. The treatment is based on the cause.  CAUSES  There are many causes for fatigue. Most of the time, fatigue can be traced to one or more of your habits or routines. Most causes fit into one or more of three general areas. They are: Lifestyle problems  Sleep disturbances.  Overwork.  Physical exertion.  Unhealthy habits.  Poor eating habits or eating disorders.  Alcohol and/or drug use .  Lack of proper nutrition (malnutrition). Psychological problems  Stress and/or anxiety problems.  Depression.  Grief.  Boredom. Medical Problems or Conditions  Anemia.  Pregnancy.  Thyroid gland problems.  Recovery from major surgery.  Continuous pain.  Emphysema or asthma that is not well controlled  Allergic conditions.  Diabetes.  Infections (such as mononucleosis).  Obesity.  Sleep disorders, such as sleep apnea.  Heart failure or other heart-related problems.  Cancer.  Kidney disease.  Liver disease.  Effects of certain medicines such as antihistamines, cough and cold remedies, prescription pain medicines, heart and blood pressure medicines, drugs used for treatment of cancer, and some antidepressants. SYMPTOMS  The symptoms of fatigue include:   Lack of energy.  Lack of drive (motivation).  Drowsiness.  Feeling of indifference to the surroundings. DIAGNOSIS  The details of how you feel help guide your caregiver in finding out what is causing the fatigue. You will be asked about your present and past health condition. It is important to review all medicines that you take, including prescription and non-prescription items. A thorough exam will be done.  You will be questioned about your feelings, habits, and normal lifestyle. Your caregiver may suggest blood tests, urine tests, or other tests to look for common medical causes of fatigue.  TREATMENT  Fatigue is treated by correcting the underlying cause. For example, if you have continuous pain or depression, treating these causes will improve how you feel. Similarly, adjusting the dose of certain medicines will help in reducing fatigue.  HOME CARE INSTRUCTIONS   Try to get the required amount of good sleep every night.  Eat a healthy and nutritious diet, and drink enough water throughout the day.  Practice ways of relaxing (including yoga or meditation).  Exercise regularly.  Make plans to change situations that cause stress. Act on those plans so that stresses decrease over time. Keep your work and personal routine reasonable.  Avoid street drugs and minimize use of alcohol.  Start taking a daily multivitamin after consulting your caregiver. SEEK MEDICAL CARE IF:   You have persistent tiredness, which cannot be accounted for.  You have fever.  You have unintentional weight loss.  You have headaches.  You have disturbed sleep throughout the night.  You are feeling sad.  You have constipation.  You have dry skin.  You have gained weight.  You are taking any new or different medicines that you suspect are causing fatigue.  You are unable to sleep at night.  You develop any unusual swelling of your legs or other parts of your body. SEEK IMMEDIATE MEDICAL CARE IF:   You are feeling confused.  Your vision is blurred.  You feel faint or pass out.  You develop severe headache.  You develop severe abdominal, pelvic, or  back pain.  You develop chest pain, shortness of breath, or an irregular or fast heartbeat.  You are unable to pass a normal amount of urine.  You develop abnormal bleeding such as bleeding from the rectum or you vomit blood.  You have thoughts  about harming yourself or committing suicide.  You are worried that you might harm someone else. MAKE SURE YOU:   Understand these instructions.  Will watch your condition.  Will get help right away if you are not doing well or get worse. Document Released: 08/03/2007 Document Revised: 12/29/2011 Document Reviewed: 02/07/2014 [facility] Patient Information 2015 [place], [place]. This information is not intended to replace advice given to you by your health care provider. Make sure you discuss any questions you have with your health care provider. Weakness Weakness is a lack of strength. It may be felt all over the body (generalized) or in one specific part of the body (focal). Some causes of weakness can be serious. You may need further medical evaluation, especially if you are elderly or you have a history of immunosuppression (such as chemotherapy or HIV), kidney disease, heart disease, or diabetes. CAUSES  Weakness can be caused by many different things, including:  Infection.  Physical exhaustion.  Internal bleeding or other blood loss that results in a lack of red blood cells (anemia).  Dehydration. This cause is more common in elderly people.  Side effects or electrolyte abnormalities from medicines, such as pain medicines or sedatives.  Emotional distress, anxiety, or depression.  Circulation problems, especially severe peripheral arterial disease.  Heart disease, such as rapid atrial fibrillation, bradycardia, or heart failure.  Nervous system disorders, such as Guillain-Barr syndrome, multiple sclerosis, or stroke. DIAGNOSIS  To find the cause of your weakness, your caregiver will take your history and perform a physical exam. Lab tests or X-rays may also be ordered, if needed. TREATMENT  Treatment of weakness depends on the cause of your symptoms and can vary greatly. HOME CARE INSTRUCTIONS   Rest as needed.  Eat a well-balanced diet.  Try to get some exercise  every day.  Only take over-the-counter or prescription medicines as directed by your caregiver. SEEK MEDICAL CARE IF:   Your weakness seems to be getting worse or spreads to other parts of your body.  You develop new aches or pains. SEEK IMMEDIATE MEDICAL CARE IF:   You cannot perform your normal daily activities, such as getting dressed and feeding yourself.  You cannot walk up and down stairs, or you feel exhausted when you do so.  You have shortness of breath or chest pain.  You have difficulty moving parts of your body.  You have weakness in only one area of the body or on only one side of the body.  You have a fever.  You have trouble speaking or swallowing.  You cannot control your bladder or bowel movements.  You have black or bloody vomit or stools. MAKE SURE YOU:  Understand these instructions.  Will watch your condition.  Will get help right away if you are not doing well or get worse. Document Released: 10/06/2005 Document Revised: 04/06/2012 Document Reviewed: 12/05/2011 [facility] Patient Information 2015 [place], [place]. This information is not intended to replace advice given to you by your health care provider. Make sure you discuss any questions you have with your health care provider.

## 2015-01-23 ENCOUNTER — Encounter: Payer: Self-pay | Admitting: *Deleted

## 2015-01-30 ENCOUNTER — Telehealth: Payer: Self-pay | Admitting: *Deleted

## 2015-01-30 NOTE — Telephone Encounter (Addendum)
Pt left message with only her name and phone number.  I returned pt's call but was unable to leave a message, pt's mailbox was full.  Pt called again asked time of 01/31/2015 appt.  I informed pt her appt 01/31/2015 was 1030am with Dr. Amalia Hailey.

## 2015-01-31 ENCOUNTER — Ambulatory Visit (INDEPENDENT_AMBULATORY_CARE_PROVIDER_SITE_OTHER): Payer: Medicare Other | Admitting: Podiatry

## 2015-01-31 ENCOUNTER — Encounter: Payer: Self-pay | Admitting: Podiatry

## 2015-01-31 DIAGNOSIS — B351 Tinea unguium: Secondary | ICD-10-CM | POA: Diagnosis not present

## 2015-01-31 DIAGNOSIS — M79676 Pain in unspecified toe(s): Secondary | ICD-10-CM | POA: Diagnosis not present

## 2015-01-31 NOTE — Patient Instructions (Signed)
Diabetes and Foot Care Diabetes may cause you to have problems because of poor blood supply (circulation) to your feet and legs. This may cause the skin on your feet to become thinner, break easier, and heal more slowly. Your skin may become dry, and the skin may peel and crack. You may also have nerve damage in your legs and feet causing decreased feeling in them. You may not notice minor injuries to your feet that could lead to infections or more serious problems. Taking care of your feet is one of the most important things you can do for yourself.  HOME CARE INSTRUCTIONS  Wear shoes at all times, even in the house. Do not go barefoot. Bare feet are easily injured.  Check your feet daily for blisters, cuts, and redness. If you cannot see the bottom of your feet, use a mirror or ask someone for help.  Wash your feet with warm water (do not use hot water) and mild soap. Then pat your feet and the areas between your toes until they are completely dry. Do not soak your feet as this can dry your skin.  Apply a moisturizing lotion or petroleum jelly (that does not contain alcohol and is unscented) to the skin on your feet and to dry, brittle toenails. Do not apply lotion between your toes.  Trim your toenails straight across. Do not dig under them or around the cuticle. File the edges of your nails with an emery board or nail file.  Do not cut corns or calluses or try to remove them with medicine.  Wear clean socks or stockings every day. Make sure they are not too tight. Do not wear knee-high stockings since they may decrease blood flow to your legs.  Wear shoes that fit properly and have enough cushioning. To break in new shoes, wear them for just a few hours a day. This prevents you from injuring your feet. Always look in your shoes before you put them on to be sure there are no objects inside.  Do not cross your legs. This may decrease the blood flow to your feet.  If you find a minor scrape,  cut, or break in the skin on your feet, keep it and the skin around it clean and dry. These areas may be cleansed with mild soap and water. Do not cleanse the area with peroxide, alcohol, or iodine.  When you remove an adhesive bandage, be sure not to damage the skin around it.  If you have a wound, look at it several times a day to make sure it is healing.  Do not use heating pads or hot water bottles. They may burn your skin. If you have lost feeling in your feet or legs, you may not know it is happening until it is too late.  Make sure your health care provider performs a complete foot exam at least annually or more often if you have foot problems. Report any cuts, sores, or bruises to your health care provider immediately. SEEK MEDICAL CARE IF:   You have an injury that is not healing.  You have cuts or breaks in the skin.  You have an ingrown nail.  You notice redness on your legs or feet.  You feel burning or tingling in your legs or feet.  You have pain or cramps in your legs and feet.  Your legs or feet are numb.  Your feet always feel cold. SEEK IMMEDIATE MEDICAL CARE IF:   There is increasing redness,   swelling, or pain in or around a wound.  There is a red line that goes up your leg.  Pus is coming from a wound.  You develop a fever or as directed by your health care provider.  You notice a bad smell coming from an ulcer or wound. Document Released: 10/03/2000 Document Revised: 06/08/2013 Document Reviewed: 03/15/2013 [employer] Patient Information 2015 [employer]. This information is not intended to replace advice given to you by your health care provider. Make sure you discuss any questions you have with your health care provider.  

## 2015-02-01 NOTE — Progress Notes (Signed)
Patient ID: [name], female   DOB: 10/13/1928, 81 y.o.   MRN: [id number]  Subjective: This patient presents for ongoing debridement of painful toenails and requests toenail debridement  Objective: The toenails are elongated, hypertrophic, incurvated, discolored and tender direct palpation  Assessment: Symptomatic onychomycoses 6-10  Plan: Debridement of toenails 10 without any bleeding  Reappoint 3 months

## 2015-03-23 ENCOUNTER — Encounter: Payer: Self-pay | Admitting: *Deleted

## 2015-05-07 ENCOUNTER — Ambulatory Visit: Payer: Medicare Other | Admitting: Podiatry

## 2015-05-08 ENCOUNTER — Ambulatory Visit: Payer: Medicare Other | Admitting: Podiatry

## 2015-05-15 ENCOUNTER — Ambulatory Visit (INDEPENDENT_AMBULATORY_CARE_PROVIDER_SITE_OTHER): Payer: Medicare Other | Admitting: Podiatry

## 2015-05-15 ENCOUNTER — Ambulatory Visit: Payer: Medicare Other | Admitting: *Deleted

## 2015-05-15 ENCOUNTER — Encounter: Payer: Self-pay | Admitting: Podiatry

## 2015-05-15 DIAGNOSIS — B351 Tinea unguium: Secondary | ICD-10-CM

## 2015-05-15 DIAGNOSIS — M79676 Pain in unspecified toe(s): Secondary | ICD-10-CM

## 2015-05-15 NOTE — Progress Notes (Signed)
   Subjective:    Patient ID: [name], female    DOB: January 16, 1928, 81 y.o.   MRN: [id number]  HPI   This patient presents today for scheduled visit complaining of painful toenails and requests toenail debridement  Review of Systems  All other systems reviewed and are negative.      Objective:   Physical Exam  Orientated 3 The toenails are brittle, elongated, discolored, hypertrophic and tender to direct palpation to 6-10    Assessment & Plan:   Assessment: Symptomatic onychomycoses 6-10  Plan: Debridement toenails 10 without any bleeding  Reappoint 3

## 2015-05-15 NOTE — Patient Instructions (Signed)
Diabetes and Foot Care Diabetes may cause you to have problems because of poor blood supply (circulation) to your feet and legs. This may cause the skin on your feet to become thinner, break easier, and heal more slowly. Your skin may become dry, and the skin may peel and crack. You may also have nerve damage in your legs and feet causing decreased feeling in them. You may not notice minor injuries to your feet that could lead to infections or more serious problems. Taking care of your feet is one of the most important things you can do for yourself.  HOME CARE INSTRUCTIONS  Wear shoes at all times, even in the house. Do not go barefoot. Bare feet are easily injured.  Check your feet daily for blisters, cuts, and redness. If you cannot see the bottom of your feet, use a mirror or ask someone for help.  Wash your feet with warm water (do not use hot water) and mild soap. Then pat your feet and the areas between your toes until they are completely dry. Do not soak your feet as this can dry your skin.  Apply a moisturizing lotion or petroleum jelly (that does not contain alcohol and is unscented) to the skin on your feet and to dry, brittle toenails. Do not apply lotion between your toes.  Trim your toenails straight across. Do not dig under them or around the cuticle. File the edges of your nails with an emery board or nail file.  Do not cut corns or calluses or try to remove them with medicine.  Wear clean socks or stockings every day. Make sure they are not too tight. Do not wear knee-high stockings since they may decrease blood flow to your legs.  Wear shoes that fit properly and have enough cushioning. To break in new shoes, wear them for just a few hours a day. This prevents you from injuring your feet. Always look in your shoes before you put them on to be sure there are no objects inside.  Do not cross your legs. This may decrease the blood flow to your feet.  If you find a minor scrape,  cut, or break in the skin on your feet, keep it and the skin around it clean and dry. These areas may be cleansed with mild soap and water. Do not cleanse the area with peroxide, alcohol, or iodine.  When you remove an adhesive bandage, be sure not to damage the skin around it.  If you have a wound, look at it several times a day to make sure it is healing.  Do not use heating pads or hot water bottles. They may burn your skin. If you have lost feeling in your feet or legs, you may not know it is happening until it is too late.  Make sure your health care provider performs a complete foot exam at least annually or more often if you have foot problems. Report any cuts, sores, or bruises to your health care provider immediately. SEEK MEDICAL CARE IF:   You have an injury that is not healing.  You have cuts or breaks in the skin.  You have an ingrown nail.  You notice redness on your legs or feet.  You feel burning or tingling in your legs or feet.  You have pain or cramps in your legs and feet.  Your legs or feet are numb.  Your feet always feel cold. SEEK IMMEDIATE MEDICAL CARE IF:   There is increasing redness,   swelling, or pain in or around a wound.  There is a red line that goes up your leg.  Pus is coming from a wound.  You develop a fever or as directed by your health care provider.  You notice a bad smell coming from an ulcer or wound. Document Released: 10/03/2000 Document Revised: 06/08/2013 Document Reviewed: 03/15/2013 [employer] Patient Information 2015 [employer]. This information is not intended to replace advice given to you by your health care provider. Make sure you discuss any questions you have with your health care provider.  

## 2015-07-04 ENCOUNTER — Other Ambulatory Visit: Payer: Self-pay | Admitting: Internal Medicine

## 2015-07-20 ENCOUNTER — Encounter: Payer: Self-pay | Admitting: Internal Medicine

## 2015-07-20 ENCOUNTER — Ambulatory Visit (INDEPENDENT_AMBULATORY_CARE_PROVIDER_SITE_OTHER): Payer: Medicare Other | Admitting: Internal Medicine

## 2015-07-20 VITALS — BP 140/80 | HR 70 | Ht 64.0 in | Wt 171.8 lb

## 2015-07-20 DIAGNOSIS — I442 Atrioventricular block, complete: Secondary | ICD-10-CM

## 2015-07-20 DIAGNOSIS — I459 Conduction disorder, unspecified: Secondary | ICD-10-CM | POA: Diagnosis not present

## 2015-07-20 DIAGNOSIS — Z45018 Encounter for adjustment and management of other part of cardiac pacemaker: Secondary | ICD-10-CM | POA: Diagnosis not present

## 2015-07-20 NOTE — Progress Notes (Signed)
f      Patient Care Team: Blanchie Serve, MD as PCP - General (Internal Medicine)   HPI  [name] is a 81 y.o. female Seen in followup for CRT P. implantation for complete heart block in the setting of ischemic heart disease and ejection fraction of 40% this was done 2/14  She has done relatively well without significant SOB  she denies chest pain and peripheral edema.  She lives with her daughter. She lost a son to pneumonia about 15 years ago.  Cr 1.12   She has a history of ischemic heart disease with prior bypass surgery. She also has hypertension obstructive sleep apnea and chronic kidney disease. Past Medical History  Diagnosis Date  . Coronary artery disease   . Celiac disease   . Heart disease   . Arthritis   . Glaucoma   . Hypercholesterolemia   . Hypertension   . Stroke   . Shortness of breath   . Diabetes mellitus     INSULIN DEPENDENT  . GERD (gastroesophageal reflux disease)   . Headache(784.0)   . Anemia   . Sleep apnea     uses cpap  . Unspecified constipation   . Acute kidney failure, unspecified   . Iron deficiency anemia, unspecified   . CHF (congestive heart failure)   . Ischemic cardiomyopathy   . Chronic systolic CHF (congestive heart failure)   . Complete heart block     Past Surgical History  Procedure Laterality Date  . Cardiac surgery    . Back surgery    . Cardiac catheterization  09/02/2012  . Coronary angioplasty with stent placement  09/02/2012    RCA  . Bi-ventricular pacemaker insertion (crt-p)  March 2014    [facility]  . Percutaneous coronary stent intervention (pci-s) N/A 09/02/2012    Procedure: PERCUTANEOUS CORONARY STENT INTERVENTION (PCI-S);  Surgeon: Clent Demark, MD;  Location: [facility] CATH LAB;  Service: Cardiovascular;  Laterality: N/A;  . Temporary pacemaker insertion N/A 11/28/2012    Procedure: TEMPORARY PACEMAKER INSERTION;  Surgeon: Clent Demark, MD;  Location: [facility] CATH LAB;  Service:  Cardiovascular;  Laterality: N/A;  . Permanent pacemaker insertion N/A 11/29/2012    Procedure: PERMANENT PACEMAKER INSERTION;  Surgeon: Deboraha Sprang, MD;  Location: [facility] CATH LAB;  Service: Cardiovascular;  Laterality: N/A;    Current Outpatient Prescriptions  Medication Sig Dispense Refill  . acetaZOLAMIDE (DIAMOX) 250 MG tablet Take 250 mg by mouth 4 (four) times daily.  3  . aspirin EC 81 MG tablet Take 81 mg by mouth daily.    Marland Kitchen atorvastatin (LIPITOR) 40 MG tablet Take 80 mg by mouth daily.     Marland Kitchen BAYER CONTOUR TEST test strip     . carvedilol (COREG) 3.125 MG tablet Take 3.125 mg by mouth 2 (two) times daily.  3  . carvedilol (COREG) 6.25 MG tablet Take 1 tablet (6.25 mg total) by mouth 2 (two) times daily. 180 tablet 3  . clopidogrel (PLAVIX) 75 MG tablet Take 75 mg by mouth daily.    . COMBIGAN 0.2-0.5 % ophthalmic solution Place 1 drop into both eyes every 12 (twelve) hours.     . docusate sodium 100 MG CAPS Take 100 mg by mouth 2 (two) times daily. 60 capsule 0  . dorzolamide (TRUSOPT) 2 % ophthalmic solution     . ferrous sulfate 325 (65 FE) MG tablet Take 325 mg by mouth 2 (two) times daily.     Marland Kitchen HUMALOG [facility]  100 UNIT/ML KiwkPen TAKE 10 UNITS BY SUBCUTANEOUS ROUTE 3 TIMES EVERY DAY WITH FOOD  3  . insulin detemir (LEVEMIR) 100 UNIT/ML injection Inject 0.3 mLs (30 Units total) into the skin every morning. Provided by Dr Laurance Flatten from endocrinology 10 mL 0  . latanoprost (XALATAN) 0.005 % ophthalmic solution Appointment OVERDUE, place 1 drop into both eyes daily at bedtime 2.5 mL 0  . LEVEMIR FLEXTOUCH 100 UNIT/ML Pen TAKE THIRTY-TWO UNIT(S) EVERY DAY  6  . lubiprostone (AMITIZA) 24 MCG capsule Take 1 capsule (24 mcg total) by mouth 2 (two) times daily with a meal. 90 capsule 3  . Multiple Vitamin (MULTIVITAMIN WITH MINERALS) TABS Take 1 tablet by mouth daily.    Marland Kitchen NOVOFINE 32G X 6 MM MISC     . pantoprazole (PROTONIX) 40 MG tablet Take 40 mg by mouth daily.     . pilocarpine  (PILOCAR) 4 % ophthalmic solution     . polyethylene glycol powder (GLYCOLAX/MIRALAX) powder TAKE 17 G BY MOUTH DAILY. HOLD FOR LOOSE STOOL 3162 g 1  . tobramycin (TOBREX) 0.3 % ophthalmic solution INSTILL 1 DROP INTO RIGHT EYE 4 TIMES A DAY  3  . traMADol (ULTRAM) 50 MG tablet Take 1 tablet (50 mg total) by mouth every 6 (six) hours as needed. 60 tablet 5  . valsartan-hydrochlorothiazide (DIOVAN-HCT) 320-25 MG per tablet Take by mouth daily. One half tablet daily    . atorvastatin (LIPITOR) 80 MG tablet Take 80 mg by mouth daily.  5  . nitroGLYCERIN (NITROSTAT) 0.4 MG SL tablet Place 0.4 mg under the tongue every 5 (five) minutes as needed. Chest pains    . polyethylene glycol powder (GLYCOLAX/MIRALAX) powder APPOINTMENT OVERDUE, take 17G by mouth daily. Hold for loose stool (Patient not taking: Reported on 07/20/2015) 250 g 0   No current facility-administered medications for this visit.    No Known Allergies  Review of Systems negative except from HPI and PMH  Physical Exam BP 140/80 mmHg  Pulse 70  Ht 5' 4"  (1.626 m)  Wt 171 lb 12.8 oz (77.928 kg)  BMI 29.47 kg/m2 Well developed and nourished in no acute distress HENT normal Neck supple with JVP-flat Clear Device pocket well healed; without hematoma or erythema.  There is no tethering Regular rate and rhythm, no murmurs or gallops Abd-soft with active BS No Clubbing cyanosis edema Skin-warm and dry A & Oriented  Grossly normal sensory and motor function    Assessment and  Plan  ICM  S/p CRT-P  [employer] The patient's device was interrogated.  The information was reviewed. No changes were made in the programming.    CHB Stable post pacing   CHF chronic mixed  HTN  Without symptoms of ischemia  cotinue current meds  Euvolemic  BP reasonable

## 2015-07-20 NOTE — Patient Instructions (Signed)
Medication Instructions:  Your physician recommends that you continue on your current medications as directed. Please refer to the Current Medication list given to you today.  Labwork: None ordered  Testing/Procedures: None ordered  Follow-Up: Remote monitoring is used to monitor your Pacemaker of ICD from home. This monitoring reduces the number of office visits required to check your device to one time per year. It allows [place] to keep an eye on the functioning of your device to ensure it is working properly. You are scheduled for a device check from home on 10/23/15. You may send your transmission at any time that day. If you have a wireless device, the transmission will be sent automatically. After your physician reviews your transmission, you will receive a postcard with your next transmission date.  Your physician wants you to follow-up in: 1 year with Dr. Caryl Comes.  You will receive a reminder letter in the mail two months in advance. If you don't receive a letter, please call our office to schedule the follow-up appointment.   Any Other Special Instructions Will Be Listed Below (If Applicable). Thank you for choosing [facility]!!

## 2015-07-24 LAB — CUP PACEART INCLINIC DEVICE CHECK
Battery Voltage: 2.89 V
Brady Statistic RA Percent Paced: 96 %
Brady Statistic RV Percent Paced: 99.98 %
Date Time Interrogation Session: 20160930171704
Lead Channel Impedance Value: 562.5 Ohm
Lead Channel Pacing Threshold Amplitude: 1 V
Lead Channel Pacing Threshold Pulse Width: 0.4 ms
Lead Channel Pacing Threshold Pulse Width: 0.4 ms
Lead Channel Setting Pacing Amplitude: 2 V
Lead Channel Setting Pacing Pulse Width: 0.4 ms
MDC IDC MSMT BATTERY REMAINING LONGEVITY: 49.2 mo
MDC IDC MSMT LEADCHNL LV IMPEDANCE VALUE: 637.5 Ohm
MDC IDC MSMT LEADCHNL LV PACING THRESHOLD AMPLITUDE: 1 V
MDC IDC MSMT LEADCHNL LV PACING THRESHOLD PULSEWIDTH: 0.4 ms
MDC IDC MSMT LEADCHNL RA IMPEDANCE VALUE: 325 Ohm
MDC IDC MSMT LEADCHNL RA PACING THRESHOLD AMPLITUDE: 0.75 V
MDC IDC MSMT LEADCHNL RA SENSING INTR AMPL: 3.1 mV
MDC IDC SET LEADCHNL LV PACING AMPLITUDE: 2 V
MDC IDC SET LEADCHNL RV PACING AMPLITUDE: 2.5 V
MDC IDC SET LEADCHNL RV PACING PULSEWIDTH: 0.4 ms
MDC IDC SET LEADCHNL RV SENSING SENSITIVITY: 4 mV
Pulse Gen Model: 3210
Pulse Gen Serial Number: 2882222

## 2015-08-07 ENCOUNTER — Encounter (HOSPITAL_COMMUNITY): Payer: Self-pay | Admitting: Emergency Medicine

## 2015-08-07 ENCOUNTER — Emergency Department (HOSPITAL_COMMUNITY): Payer: Medicare Other

## 2015-08-07 ENCOUNTER — Emergency Department (HOSPITAL_COMMUNITY)
Admission: EM | Admit: 2015-08-07 | Discharge: 2015-08-07 | Disposition: A | Discharge status: Home / Self Care | Payer: Medicare Other | Attending: Emergency Medicine | Admitting: Emergency Medicine

## 2015-08-07 DIAGNOSIS — Z9981 Dependence on supplemental oxygen: Secondary | ICD-10-CM | POA: Insufficient documentation

## 2015-08-07 DIAGNOSIS — Z8673 Personal history of transient ischemic attack (TIA), and cerebral infarction without residual deficits: Secondary | ICD-10-CM | POA: Diagnosis not present

## 2015-08-07 DIAGNOSIS — R531 Weakness: Secondary | ICD-10-CM

## 2015-08-07 DIAGNOSIS — I5022 Chronic systolic (congestive) heart failure: Secondary | ICD-10-CM | POA: Insufficient documentation

## 2015-08-07 DIAGNOSIS — H409 Unspecified glaucoma: Secondary | ICD-10-CM | POA: Diagnosis not present

## 2015-08-07 DIAGNOSIS — E119 Type 2 diabetes mellitus without complications: Secondary | ICD-10-CM | POA: Diagnosis not present

## 2015-08-07 DIAGNOSIS — Z87891 Personal history of nicotine dependence: Secondary | ICD-10-CM | POA: Insufficient documentation

## 2015-08-07 DIAGNOSIS — Z87448 Personal history of other diseases of urinary system: Secondary | ICD-10-CM | POA: Insufficient documentation

## 2015-08-07 DIAGNOSIS — Z9889 Other specified postprocedural states: Secondary | ICD-10-CM | POA: Insufficient documentation

## 2015-08-07 DIAGNOSIS — M1990 Unspecified osteoarthritis, unspecified site: Secondary | ICD-10-CM | POA: Insufficient documentation

## 2015-08-07 DIAGNOSIS — I10 Essential (primary) hypertension: Secondary | ICD-10-CM | POA: Diagnosis not present

## 2015-08-07 DIAGNOSIS — D509 Iron deficiency anemia, unspecified: Secondary | ICD-10-CM | POA: Diagnosis not present

## 2015-08-07 DIAGNOSIS — K5900 Constipation, unspecified: Secondary | ICD-10-CM | POA: Insufficient documentation

## 2015-08-07 DIAGNOSIS — M6281 Muscle weakness (generalized): Secondary | ICD-10-CM | POA: Insufficient documentation

## 2015-08-07 DIAGNOSIS — G4730 Sleep apnea, unspecified: Secondary | ICD-10-CM | POA: Diagnosis not present

## 2015-08-07 DIAGNOSIS — E7800 Pure hypercholesterolemia, unspecified: Secondary | ICD-10-CM | POA: Insufficient documentation

## 2015-08-07 DIAGNOSIS — R05 Cough: Secondary | ICD-10-CM | POA: Insufficient documentation

## 2015-08-07 DIAGNOSIS — I2510 Atherosclerotic heart disease of native coronary artery without angina pectoris: Secondary | ICD-10-CM | POA: Insufficient documentation

## 2015-08-07 DIAGNOSIS — Z79899 Other long term (current) drug therapy: Secondary | ICD-10-CM | POA: Diagnosis not present

## 2015-08-07 DIAGNOSIS — Z9861 Coronary angioplasty status: Secondary | ICD-10-CM | POA: Insufficient documentation

## 2015-08-07 DIAGNOSIS — K219 Gastro-esophageal reflux disease without esophagitis: Secondary | ICD-10-CM | POA: Diagnosis not present

## 2015-08-07 DIAGNOSIS — Z794 Long term (current) use of insulin: Secondary | ICD-10-CM | POA: Diagnosis not present

## 2015-08-07 DIAGNOSIS — Z7982 Long term (current) use of aspirin: Secondary | ICD-10-CM | POA: Diagnosis not present

## 2015-08-07 LAB — CBC
HEMATOCRIT: 32.4 % — AB (ref 36.0–46.0)
HEMOGLOBIN: 10.2 g/dL — AB (ref 12.0–15.0)
MCH: 28.3 pg (ref 26.0–34.0)
MCHC: 31.5 g/dL (ref 30.0–36.0)
MCV: 89.8 fL (ref 78.0–100.0)
Platelets: 236 10*3/uL (ref 150–400)
RBC: 3.61 MIL/uL — ABNORMAL LOW (ref 3.87–5.11)
RDW: 13.3 % (ref 11.5–15.5)
WBC: 5.7 10*3/uL (ref 4.0–10.5)

## 2015-08-07 LAB — URINE MICROSCOPIC-ADD ON

## 2015-08-07 LAB — BASIC METABOLIC PANEL
ANION GAP: 7 (ref 5–15)
BUN: 21 mg/dL — ABNORMAL HIGH (ref 6–20)
CALCIUM: 10.4 mg/dL — AB (ref 8.9–10.3)
CO2: 23 mmol/L (ref 22–32)
Chloride: 108 mmol/L (ref 101–111)
Creatinine, Ser: 1.56 mg/dL — ABNORMAL HIGH (ref 0.44–1.00)
GFR calc Af Amer: 34 mL/min — ABNORMAL LOW (ref >60)
GFR calc non Af Amer: 29 mL/min — ABNORMAL LOW (ref >60)
GLUCOSE: 215 mg/dL — AB (ref 65–99)
Potassium: 3.5 mmol/L (ref 3.5–5.1)
Sodium: 138 mmol/L (ref 135–145)

## 2015-08-07 LAB — URINALYSIS, ROUTINE W REFLEX MICROSCOPIC
Bilirubin Urine: NEGATIVE
Glucose, UA: NEGATIVE mg/dL
Hgb urine dipstick: NEGATIVE
Ketones, ur: NEGATIVE mg/dL
NITRITE: NEGATIVE
Protein, ur: NEGATIVE mg/dL
SPECIFIC GRAVITY, URINE: 1.016 (ref 1.005–1.030)
Urobilinogen, UA: 0.2 mg/dL (ref 0.0–1.0)
pH: 6 (ref 5.0–8.0)

## 2015-08-07 LAB — I-STAT TROPONIN, ED: Troponin i, poc: 0.01 ng/mL (ref 0.00–0.08)

## 2015-08-07 NOTE — ED Notes (Signed)
Pt sts weakness starting last night; pt sts BP low at home; pt denies pain

## 2015-08-07 NOTE — ED Notes (Signed)
MD at bedside. 

## 2015-08-07 NOTE — ED Provider Notes (Signed)
CSN: [id number]     Arrival date & time 08/07/15  1604 History   First MD Initiated Contact with Patient 08/07/15 1706     Chief Complaint  Patient presents with  . Weakness     (Consider location/radiation/quality/duration/timing/severity/associated sxs/prior Treatment) HPI Comments: Patient with history of pacemaker due to complete heart block, hypertension, diabetes on insulin -- presents with complaint of generalized weakness and fatigue starting last night and was much worse this morning. Patient states that she did not want to get out of bed. She denies all other symptoms except for occasional cough. She has not had any fevers, nausea, vomiting, or diarrhea. No chest pain or abdominal pain. No shortness of breath. Family states that she was very tired 2 days ago as well. They think that she may have been taking one whole tablet instead of a half tablet of her blood pressure medication in error. Her blood sugars have been running low at times early in the morning. No new medications. No other symptoms or concerns voiced. Onset of symptoms acute. Course is constant. Nothing makes symptoms better or worse.  The history is provided by the patient and a relative.    Past Medical History  Diagnosis Date  . Coronary artery disease   . Celiac disease   . Heart disease   . Arthritis   . Glaucoma   . Hypercholesterolemia   . Hypertension   . Stroke ([place])   . Shortness of breath   . Diabetes mellitus     INSULIN DEPENDENT  . GERD (gastroesophageal reflux disease)   . Headache(784.0)   . Anemia   . Sleep apnea     uses cpap  . Unspecified constipation   . Acute kidney failure, unspecified ([place])   . Iron deficiency anemia, unspecified   . CHF (congestive heart failure) ([place])   . Ischemic cardiomyopathy   . Chronic systolic CHF (congestive heart failure) ([place])   . Complete heart block [facility])    Past Surgical History  Procedure Laterality Date  . Cardiac surgery    . Back surgery     . Cardiac catheterization  09/02/2012  . Coronary angioplasty with stent placement  09/02/2012    RCA  . Bi-ventricular pacemaker insertion (crt-p)  March 2014    [facility]  . Percutaneous coronary stent intervention (pci-s) N/A 09/02/2012    Procedure: PERCUTANEOUS CORONARY STENT INTERVENTION (PCI-S);  Surgeon: Clent Demark, MD;  Location: [facility] CATH LAB;  Service: Cardiovascular;  Laterality: N/A;  . Temporary pacemaker insertion N/A 11/28/2012    Procedure: TEMPORARY PACEMAKER INSERTION;  Surgeon: Clent Demark, MD;  Location: [facility] CATH LAB;  Service: Cardiovascular;  Laterality: N/A;  . Permanent pacemaker insertion N/A 11/29/2012    Procedure: PERMANENT PACEMAKER INSERTION;  Surgeon: Deboraha Sprang, MD;  Location: [facility] CATH LAB;  Service: Cardiovascular;  Laterality: N/A;   Family History  Problem Relation Age of Onset  . Diabetes Mother   . Hypertension Mother   . Hyperlipidemia Mother   . Cancer Sister   . Dementia Sister   . Neuropathy Sister    Social History  Substance Use Topics  . Smoking status: Former Smoker    Quit date: 12/26/1976  . Smokeless tobacco: Never Used  . Alcohol Use: No   OB History    Gravida Para Term Preterm AB TAB SAB Ectopic Multiple Living   4 4 4       3      Review of Systems  Constitutional: Positive  for fatigue. Negative for fever.  HENT: Negative for rhinorrhea and sore throat.   Eyes: Negative for redness.  Respiratory: Positive for cough. Negative for shortness of breath.   Cardiovascular: Negative for chest pain.  Gastrointestinal: Negative for nausea, vomiting, abdominal pain and diarrhea.  Genitourinary: Negative for dysuria.  Musculoskeletal: Negative for myalgias.  Skin: Negative for rash.  Neurological: Positive for weakness (nonfocal, generalized). Negative for headaches.    Allergies  Review of patient's allergies indicates no known allergies.  Home Medications   Prior to Admission medications   Medication Sig  Start Date End Date Taking? Authorizing Provider  acetaZOLAMIDE (DIAMOX) 250 MG tablet Take 250 mg by mouth 4 (four) times daily. 03/28/15   Historical Provider, MD  aspirin EC 81 MG tablet Take 81 mg by mouth daily.    Historical Provider, MD  atorvastatin (LIPITOR) 40 MG tablet Take 80 mg by mouth daily.     Historical Provider, MD  atorvastatin (LIPITOR) 80 MG tablet Take 80 mg by mouth daily. 04/17/15   Historical Provider, MD  BAYER CONTOUR TEST test strip  01/24/13   Historical Provider, MD  carvedilol (COREG) 3.125 MG tablet Take 3.125 mg by mouth 2 (two) times daily. 04/17/15   Historical Provider, MD  carvedilol (COREG) 6.25 MG tablet Take 1 tablet (6.25 mg total) by mouth 2 (two) times daily. 01/09/14   Deboraha Sprang, MD  clopidogrel (PLAVIX) 75 MG tablet Take 75 mg by mouth daily.    Historical Provider, MD  COMBIGAN 0.2-0.5 % ophthalmic solution Place 1 drop into both eyes every 12 (twelve) hours.  04/03/12   Historical Provider, MD  docusate sodium 100 MG CAPS Take 100 mg by mouth 2 (two) times daily. 10/22/12   Annita Brod, MD  dorzolamide (TRUSOPT) 2 % ophthalmic solution  07/28/13   Historical Provider, MD  ferrous sulfate 325 (65 FE) MG tablet Take 325 mg by mouth 2 (two) times daily.     Historical Provider, MD  HUMALOG KWIKPEN 100 UNIT/ML KiwkPen TAKE 10 UNITS BY SUBCUTANEOUS ROUTE 3 TIMES EVERY DAY WITH FOOD 04/10/15   Historical Provider, MD  insulin detemir (LEVEMIR) 100 UNIT/ML injection Inject 0.3 mLs (30 Units total) into the skin every morning. Provided by Dr Laurance Flatten from endocrinology 09/14/13   Blanchie Serve, MD  latanoprost (XALATAN) 0.005 % ophthalmic solution Appointment OVERDUE, place 1 drop into both eyes daily at bedtime 03/02/14   Mahima Pandey, MD  LEVEMIR FLEXTOUCH 100 UNIT/ML Pen TAKE THIRTY-TWO UNIT(S) EVERY DAY 03/13/15   Historical Provider, MD  lubiprostone (AMITIZA) 24 MCG capsule Take 1 capsule (24 mcg total) by mouth 2 (two) times daily with a meal. 06/08/13    Blanchie Serve, MD  Multiple Vitamin (MULTIVITAMIN WITH MINERALS) TABS Take 1 tablet by mouth daily.    Historical Provider, MD  nitroGLYCERIN (NITROSTAT) 0.4 MG SL tablet Place 0.4 mg under the tongue every 5 (five) minutes as needed. Chest pains 09/03/12   Charolette Forward, MD  NOVOFINE 32G X 6 MM MISC  01/25/13   Historical Provider, MD  pantoprazole (PROTONIX) 40 MG tablet Take 40 mg by mouth daily.  05/07/15   Historical Provider, MD  pilocarpine (PILOCAR) 4 % ophthalmic solution  05/01/15   Historical Provider, MD  polyethylene glycol powder (GLYCOLAX/MIRALAX) powder TAKE 17 G BY MOUTH DAILY. HOLD FOR LOOSE STOOL 06/01/14   Blanchie Serve, MD  polyethylene glycol powder (GLYCOLAX/MIRALAX) powder APPOINTMENT OVERDUE, take 17G by mouth daily. Hold for loose stool Patient not taking: Reported  on 07/20/2015 07/04/15   Lauree Chandler, NP  tobramycin (TOBREX) 0.3 % ophthalmic solution INSTILL 1 DROP INTO RIGHT EYE 4 TIMES A DAY 03/06/15   Historical Provider, MD  traMADol (ULTRAM) 50 MG tablet Take 1 tablet (50 mg total) by mouth every 6 (six) hours as needed. 11/28/13   Marcial Pacas, MD  valsartan-hydrochlorothiazide (DIOVAN-HCT) 320-25 MG per tablet Take by mouth daily. One half tablet daily    Historical Provider, MD   BP 103/61 mmHg  Pulse 70  Temp(Src) 98.8 F (37.1 C) (Oral)  Resp 16  SpO2 100%   Physical Exam  Constitutional: She is oriented to person, place, and time. She appears well-developed and well-nourished.  HENT:  Head: Normocephalic and atraumatic.  Mouth/Throat: Oropharynx is clear and moist.  Eyes: Conjunctivae are normal. Right eye exhibits no discharge. Left eye exhibits no discharge.  Neck: Normal range of motion. Neck supple.  Cardiovascular: Normal rate, regular rhythm and normal heart sounds.   No murmur heard. Pulmonary/Chest: Effort normal and breath sounds normal. No respiratory distress. She has no wheezes. She has no rales.  Abdominal: Soft. Bowel sounds are normal. There  is no tenderness. There is no rebound and no guarding.  Neurological: She is alert and oriented to person, place, and time. No cranial nerve deficit. Coordination normal.  Skin: Skin is warm and dry.  Psychiatric: She has a normal mood and affect.  Nursing note and vitals reviewed.   ED Course  Procedures (including critical care time) Labs Review Labs Reviewed  BASIC METABOLIC PANEL - Abnormal; Notable for the following:    Glucose, Bld 215 (*)    BUN 21 (*)    Creatinine, Ser 1.56 (*)    Calcium 10.4 (*)    GFR calc non Af Amer 29 (*)    GFR calc Af Amer 34 (*)    All other components within normal limits  CBC - Abnormal; Notable for the following:    RBC 3.61 (*)    Hemoglobin 10.2 (*)    HCT 32.4 (*)    All other components within normal limits  URINALYSIS, ROUTINE W REFLEX MICROSCOPIC (NOT AT [facility]) - Abnormal; Notable for the following:    Leukocytes, UA SMALL (*)    All other components within normal limits  URINE MICROSCOPIC-ADD ON - Abnormal; Notable for the following:    Squamous Epithelial / LPF FEW (*)    Casts HYALINE CASTS (*)    All other components within normal limits  I-STAT TROPOININ, ED    Imaging Review Dg Chest 2 View  08/07/2015  CLINICAL DATA:  Weakness, hypotension for 2 days EXAM: CHEST  2 VIEW COMPARISON:  11/30/2012 FINDINGS: Prior CABG. Left pacer is in place, unchanged. Heart is upper limits normal in size. Lungs are clear. No effusions or edema. No acute bony abnormality. IMPRESSION: No active cardiopulmonary disease. Electronically Signed   By: Rolm Baptise M.D.   On: 08/07/2015 17:07   I have personally reviewed and evaluated these images and lab results as part of my medical decision-making.   EKG Interpretation None       5:38 PM Patient seen and examined. Work-up reviewed, urine pending.    Vital signs reviewed and are as follows: BP 103/61 mmHg  Pulse 70  Temp(Src) 98.8 F (37.1 C) (Oral)  Resp 16  SpO2 100%  Patient was  discussed with and seen by Dr. Lacinda Axon.   UA clear. Patient to reduce blood pressure medication from one full tablet to  half tablet daily. She will monitor her blood sugars at home closely, especially in the morning, and follow up with her primary care doctor regarding these issues.  Patient urged to return with worsening symptoms or other concerns. Patient verbalized understanding and agrees with plan.    MDM   Final diagnoses:  Generalized weakness   Patient with generalized weakness. Reassuring workup here. No evidence of infection. No evidence of stroke. No evidence of ACS. mild anemia but no reported bleeding. Patient appears well. Family is active in her care and is able to monitor closely. Patient has appropriate PCP follow-up. Discussed signs and symptoms to return.   Carlisle Cater, PA-C 08/07/15 2027  Nat Christen, MD 08/07/15 2236

## 2015-08-07 NOTE — Discharge Instructions (Signed)
Please read and follow all provided instructions.  Your diagnoses today include:  1. Generalized weakness    Tests performed today include:  Blood counts and electrolytes-normal  Urine test-normal  EKG-normal  Chest x-ray-normal  Vital signs. See below for your results today.   Medications prescribed:   None  Take any prescribed medications only as directed.  Home care instructions:  Follow any educational materials contained in this packet.  Take one half tablet of your blood pressure medicine daily. Log blood sugars first thing in the morning. Follow-up with PCP to see if any adjustments are necessary.  Follow-up instructions: Please follow-up with your primary care provider in the next 3 days for further evaluation of your symptoms.   Return instructions:   Please return to the Emergency Department if you experience worsening symptoms.   Please return if you have any other emergent concerns.  Additional Information:  Your vital signs today were: BP 123/57 mmHg   Pulse 75   Temp(Src) 98.8 F (37.1 C) (Oral)   Resp 15   SpO2 100% If your blood pressure (BP) was elevated above 135/85 this visit, please have this repeated by your doctor within one month. --------------

## 2015-08-14 ENCOUNTER — Other Ambulatory Visit: Payer: Self-pay | Admitting: Nurse Practitioner

## 2015-08-15 ENCOUNTER — Ambulatory Visit (INDEPENDENT_AMBULATORY_CARE_PROVIDER_SITE_OTHER): Payer: Medicare Other | Admitting: Podiatry

## 2015-08-15 ENCOUNTER — Encounter: Payer: Self-pay | Admitting: Podiatry

## 2015-08-15 DIAGNOSIS — M79676 Pain in unspecified toe(s): Secondary | ICD-10-CM | POA: Diagnosis not present

## 2015-08-15 DIAGNOSIS — B351 Tinea unguium: Secondary | ICD-10-CM

## 2015-08-15 NOTE — Patient Instructions (Signed)
Diabetes and Foot Care Diabetes may cause you to have problems because of poor blood supply (circulation) to your feet and legs. This may cause the skin on your feet to become thinner, break easier, and heal more slowly. Your skin may become dry, and the skin may peel and crack. You may also have nerve damage in your legs and feet causing decreased feeling in them. You may not notice minor injuries to your feet that could lead to infections or more serious problems. Taking care of your feet is one of the most important things you can do for yourself.  HOME CARE INSTRUCTIONS  Wear shoes at all times, even in the house. Do not go barefoot. Bare feet are easily injured.  Check your feet daily for blisters, cuts, and redness. If you cannot see the bottom of your feet, use a mirror or ask someone for help.  Wash your feet with warm water (do not use hot water) and mild soap. Then pat your feet and the areas between your toes until they are completely dry. Do not soak your feet as this can dry your skin.  Apply a moisturizing lotion or petroleum jelly (that does not contain alcohol and is unscented) to the skin on your feet and to dry, brittle toenails. Do not apply lotion between your toes.  Trim your toenails straight across. Do not dig under them or around the cuticle. File the edges of your nails with an emery board or nail file.  Do not cut corns or calluses or try to remove them with medicine.  Wear clean socks or stockings every day. Make sure they are not too tight. Do not wear knee-high stockings since they may decrease blood flow to your legs.  Wear shoes that fit properly and have enough cushioning. To break in new shoes, wear them for just a few hours a day. This prevents you from injuring your feet. Always look in your shoes before you put them on to be sure there are no objects inside.  Do not cross your legs. This may decrease the blood flow to your feet.  If you find a minor scrape,  cut, or break in the skin on your feet, keep it and the skin around it clean and dry. These areas may be cleansed with mild soap and water. Do not cleanse the area with peroxide, alcohol, or iodine.  When you remove an adhesive bandage, be sure not to damage the skin around it.  If you have a wound, look at it several times a day to make sure it is healing.  Do not use heating pads or hot water bottles. They may burn your skin. If you have lost feeling in your feet or legs, you may not know it is happening until it is too late.  Make sure your health care provider performs a complete foot exam at least annually or more often if you have foot problems. Report any cuts, sores, or bruises to your health care provider immediately. SEEK MEDICAL CARE IF:   You have an injury that is not healing.  You have cuts or breaks in the skin.  You have an ingrown nail.  You notice redness on your legs or feet.  You feel burning or tingling in your legs or feet.  You have pain or cramps in your legs and feet.  Your legs or feet are numb.  Your feet always feel cold. SEEK IMMEDIATE MEDICAL CARE IF:   There is increasing redness,   swelling, or pain in or around a wound.  There is a red line that goes up your leg.  Pus is coming from a wound.  You develop a fever or as directed by your health care provider.  You notice a bad smell coming from an ulcer or wound.   This information is not intended to replace advice given to you by your health care provider. Make sure you discuss any questions you have with your health care provider.   Document Released: 10/03/2000 Document Revised: 06/08/2013 Document Reviewed: 03/15/2013 [employer] Interactive Patient Education 2016 [employer]  

## 2015-08-15 NOTE — Progress Notes (Signed)
Patient ID: [name], female   DOB: Jul 27, 1928, 81 y.o.   MRN: [id number]  Subjective: This patient presents for a scheduled visit complaining of painful toenails and walking wearing shoes and request toenail debridement  Objective: Orientated 3 No open skin lesions noted bilaterally The toenails are hypertrophic, elongated, brittle, deformed and tender to direct palpation 6-10  Assessment: Type II diabetic Symptomatic onychomycoses 6-10  Plan: Debrided toenails 10 mechanically and electronically without any bleeding  Reappoint times him

## 2015-10-23 ENCOUNTER — Ambulatory Visit (INDEPENDENT_AMBULATORY_CARE_PROVIDER_SITE_OTHER): Payer: Medicare Other | Admitting: *Deleted

## 2015-10-23 DIAGNOSIS — I442 Atrioventricular block, complete: Secondary | ICD-10-CM

## 2015-10-24 NOTE — Progress Notes (Signed)
Remote pacemaker transmission.   

## 2015-10-31 ENCOUNTER — Encounter: Payer: Self-pay | Admitting: Cardiology

## 2015-10-31 LAB — CUP PACEART REMOTE DEVICE CHECK
Battery Remaining Percentage: 57 %
Battery Voltage: 2.89 V
Brady Statistic AP VS Percent: 1 %
Brady Statistic AS VP Percent: 4.3 %
Brady Statistic AS VS Percent: 1 %
Date Time Interrogation Session: 20170103074512
Implantable Lead Implant Date: 20140210
Implantable Lead Location: 753859
Implantable Lead Location: 753860
Lead Channel Impedance Value: 610 Ohm
Lead Channel Pacing Threshold Amplitude: 0.75 V
Lead Channel Pacing Threshold Amplitude: 1 V
Lead Channel Pacing Threshold Pulse Width: 0.4 ms
Lead Channel Pacing Threshold Pulse Width: 0.4 ms
Lead Channel Sensing Intrinsic Amplitude: 2.6 mV
Lead Channel Setting Pacing Amplitude: 2 V
Lead Channel Setting Pacing Amplitude: 2.5 V
Lead Channel Setting Pacing Pulse Width: 0.4 ms
Lead Channel Setting Pacing Pulse Width: 0.4 ms
Lead Channel Setting Sensing Sensitivity: 4 mV
MDC IDC LEAD IMPLANT DT: 20140210
MDC IDC LEAD IMPLANT DT: 20140210
MDC IDC LEAD LOCATION: 753858
MDC IDC MSMT BATTERY REMAINING LONGEVITY: 50 mo
MDC IDC MSMT LEADCHNL LV PACING THRESHOLD AMPLITUDE: 1 V
MDC IDC MSMT LEADCHNL RA IMPEDANCE VALUE: 330 Ohm
MDC IDC MSMT LEADCHNL RA PACING THRESHOLD PULSEWIDTH: 0.4 ms
MDC IDC MSMT LEADCHNL RV IMPEDANCE VALUE: 540 Ohm
MDC IDC MSMT LEADCHNL RV SENSING INTR AMPL: 12 mV
MDC IDC PG SERIAL: 2882222
MDC IDC SET LEADCHNL LV PACING AMPLITUDE: 2 V
MDC IDC STAT BRADY AP VP PERCENT: 96 %
MDC IDC STAT BRADY RA PERCENT PACED: 96 %

## 2015-11-21 ENCOUNTER — Ambulatory Visit: Payer: Medicare Other | Admitting: Podiatry

## 2015-11-27 ENCOUNTER — Encounter: Payer: Self-pay | Admitting: Podiatry

## 2015-11-27 ENCOUNTER — Ambulatory Visit (INDEPENDENT_AMBULATORY_CARE_PROVIDER_SITE_OTHER): Payer: Medicare Other | Admitting: Podiatry

## 2015-11-27 VITALS — BP 138/58 | HR 76

## 2015-11-27 DIAGNOSIS — M79606 Pain in leg, unspecified: Secondary | ICD-10-CM | POA: Diagnosis not present

## 2015-11-27 DIAGNOSIS — B351 Tinea unguium: Secondary | ICD-10-CM | POA: Diagnosis not present

## 2015-11-27 NOTE — Progress Notes (Signed)
Subjective: 82 year old female presents complaining of pain on top of both feet. Painful toenails and walking wearing shoes and request toenail debridement. Both feet are tingling on toes at times. Not enough to cause pain.  Systems reviews: Negative other than joint pain.   Objective: Dermatologic: No open skin lesions noted bilaterally The toenails are hypertrophic, elongated, brittle, deformed and tender to direct palpation x 10. Vascular: Normal with all palpable pedal pulses. Neurologic: Subjective tingling sensation on toes at times. Protective sensory perception intact. Orthopedic: Hypermobile first ray bilateral. Forefoot varus with forefoot loading and rearfoot placed in neutral.  Assessment: Type II diabetic Symptomatic mycotic nails x 10.  Plan: Debrided all nails 10. May take Advil for foot pain. Call if pain persist. Will prescribe stronger NSAIA. Return in 3 months for RFC.

## 2015-11-27 NOTE — Patient Instructions (Signed)
Seen for hypertrophic nails. All nails debrided. Return in 3 months or as needed.  

## 2015-12-13 ENCOUNTER — Other Ambulatory Visit: Payer: Self-pay | Admitting: Nurse Practitioner

## 2016-01-01 ENCOUNTER — Other Ambulatory Visit: Payer: Self-pay

## 2016-01-01 DIAGNOSIS — Z1231 Encounter for screening mammogram for malignant neoplasm of breast: Secondary | ICD-10-CM

## 2016-01-14 ENCOUNTER — Ambulatory Visit
Admission: RE | Admit: 2016-01-14 | Discharge: 2016-01-14 | Disposition: A | Discharge status: Home / Self Care | Payer: Medicare Other | Source: Ambulatory Visit

## 2016-01-14 DIAGNOSIS — Z1231 Encounter for screening mammogram for malignant neoplasm of breast: Secondary | ICD-10-CM

## 2016-01-22 ENCOUNTER — Ambulatory Visit (INDEPENDENT_AMBULATORY_CARE_PROVIDER_SITE_OTHER): Payer: Medicare Other | Admitting: *Deleted

## 2016-01-22 DIAGNOSIS — I442 Atrioventricular block, complete: Secondary | ICD-10-CM

## 2016-01-22 NOTE — Progress Notes (Signed)
Remote pacemaker transmission.   

## 2016-02-08 ENCOUNTER — Encounter (HOSPITAL_COMMUNITY): Payer: Self-pay | Admitting: *Deleted

## 2016-02-08 DIAGNOSIS — Z9889 Other specified postprocedural states: Secondary | ICD-10-CM | POA: Insufficient documentation

## 2016-02-08 DIAGNOSIS — E119 Type 2 diabetes mellitus without complications: Secondary | ICD-10-CM | POA: Diagnosis not present

## 2016-02-08 DIAGNOSIS — Z79899 Other long term (current) drug therapy: Secondary | ICD-10-CM | POA: Diagnosis not present

## 2016-02-08 DIAGNOSIS — E7800 Pure hypercholesterolemia, unspecified: Secondary | ICD-10-CM | POA: Diagnosis not present

## 2016-02-08 DIAGNOSIS — Y9389 Activity, other specified: Secondary | ICD-10-CM | POA: Insufficient documentation

## 2016-02-08 DIAGNOSIS — Z87891 Personal history of nicotine dependence: Secondary | ICD-10-CM | POA: Diagnosis not present

## 2016-02-08 DIAGNOSIS — Y9289 Other specified places as the place of occurrence of the external cause: Secondary | ICD-10-CM | POA: Insufficient documentation

## 2016-02-08 DIAGNOSIS — H409 Unspecified glaucoma: Secondary | ICD-10-CM | POA: Diagnosis not present

## 2016-02-08 DIAGNOSIS — Z9981 Dependence on supplemental oxygen: Secondary | ICD-10-CM | POA: Diagnosis not present

## 2016-02-08 DIAGNOSIS — S52502A Unspecified fracture of the lower end of left radius, initial encounter for closed fracture: Secondary | ICD-10-CM | POA: Diagnosis not present

## 2016-02-08 DIAGNOSIS — Z7902 Long term (current) use of antithrombotics/antiplatelets: Secondary | ICD-10-CM | POA: Diagnosis not present

## 2016-02-08 DIAGNOSIS — M1990 Unspecified osteoarthritis, unspecified site: Secondary | ICD-10-CM | POA: Diagnosis not present

## 2016-02-08 DIAGNOSIS — K5900 Constipation, unspecified: Secondary | ICD-10-CM | POA: Diagnosis not present

## 2016-02-08 DIAGNOSIS — D509 Iron deficiency anemia, unspecified: Secondary | ICD-10-CM | POA: Diagnosis not present

## 2016-02-08 DIAGNOSIS — W01198A Fall on same level from slipping, tripping and stumbling with subsequent striking against other object, initial encounter: Secondary | ICD-10-CM | POA: Insufficient documentation

## 2016-02-08 DIAGNOSIS — Z8673 Personal history of transient ischemic attack (TIA), and cerebral infarction without residual deficits: Secondary | ICD-10-CM | POA: Diagnosis not present

## 2016-02-08 DIAGNOSIS — S6992XA Unspecified injury of left wrist, hand and finger(s), initial encounter: Secondary | ICD-10-CM | POA: Diagnosis present

## 2016-02-08 DIAGNOSIS — Y998 Other external cause status: Secondary | ICD-10-CM | POA: Insufficient documentation

## 2016-02-08 DIAGNOSIS — G4730 Sleep apnea, unspecified: Secondary | ICD-10-CM | POA: Diagnosis not present

## 2016-02-08 DIAGNOSIS — Z794 Long term (current) use of insulin: Secondary | ICD-10-CM | POA: Insufficient documentation

## 2016-02-08 DIAGNOSIS — K219 Gastro-esophageal reflux disease without esophagitis: Secondary | ICD-10-CM | POA: Diagnosis not present

## 2016-02-08 DIAGNOSIS — Z7982 Long term (current) use of aspirin: Secondary | ICD-10-CM | POA: Insufficient documentation

## 2016-02-08 DIAGNOSIS — I5022 Chronic systolic (congestive) heart failure: Secondary | ICD-10-CM | POA: Insufficient documentation

## 2016-02-08 DIAGNOSIS — I10 Essential (primary) hypertension: Secondary | ICD-10-CM | POA: Diagnosis not present

## 2016-02-08 DIAGNOSIS — I2510 Atherosclerotic heart disease of native coronary artery without angina pectoris: Secondary | ICD-10-CM | POA: Diagnosis not present

## 2016-02-08 NOTE — ED Notes (Signed)
The pt fell 2 days ago when nher blood sugar dropped.  C/o lt wrist  Lt hand pain   With lt head pain since the fall

## 2016-02-09 ENCOUNTER — Encounter (HOSPITAL_COMMUNITY): Payer: Self-pay | Admitting: Emergency Medicine

## 2016-02-09 ENCOUNTER — Emergency Department (HOSPITAL_COMMUNITY)
Admission: EM | Admit: 2016-02-09 | Discharge: 2016-02-09 | Disposition: A | Discharge status: Home / Self Care | Payer: Medicare Other | Attending: Emergency Medicine | Admitting: Emergency Medicine

## 2016-02-09 ENCOUNTER — Emergency Department (HOSPITAL_COMMUNITY): Payer: Medicare Other

## 2016-02-09 DIAGNOSIS — S52502A Unspecified fracture of the lower end of left radius, initial encounter for closed fracture: Secondary | ICD-10-CM

## 2016-02-09 MED ORDER — ACETAMINOPHEN 500 MG PO TABS
1000.0000 mg | ORAL_TABLET | Freq: Once | ORAL | Status: AC
  Administered 2016-02-09: 1000 mg via ORAL
  Filled 2016-02-09: qty 2

## 2016-02-09 NOTE — ED Notes (Signed)
Ortho paged. 

## 2016-02-09 NOTE — ED Notes (Signed)
Ortho at bedside.

## 2016-02-09 NOTE — ED Provider Notes (Signed)
CSN: [id number]     Arrival date & time 02/08/16  2233 History   By signing my name below, I, [name], attest that this documentation has been prepared under the direction and in the presence of Kirstein Baxley, MD.  Electronically Signed: [name], ED Scribe. 02/09/2016. 3:20 AM.   Chief Complaint  Patient presents with   Fall   Patient is a 82 y.o. female presenting with fall. The history is provided by the patient. No language interpreter was used.  Fall This is a new problem. The current episode started more than 2 days ago. The problem occurs constantly. The problem has not changed since onset.Associated symptoms include headaches. Pertinent negatives include no chest pain, no abdominal pain and no shortness of breath. Nothing aggravates the symptoms. Nothing relieves the symptoms. The treatment provided no relief.    HPI Comments: [name] is a 82 y.o. female with a PMHx of CAD, stroke, HTN, CHF, and DM who presents to the Emergency Department here after a fall sustained 2 days ago. Pt states she was getting up heading to the living room when she fell to the ground hitting her head. Pt admits she felt mildly dizzy and lightheaded prior to falling as her blood sugar dropped. She now reports ongoing swelling to the L hand along with a sore "knot" to the top of her head which she noted today. Soreness to area is exacerbated with pressure to area without any alleviating factors. OTC Ibuprofen attempted prior to arrival with mild temporary improvement. No recent fever, chills, nausea, vomiting, chest pain, or shortness of breath.  PCP: Blanchie Serve, MD    Past Medical History  Diagnosis Date   Coronary artery disease    Celiac disease    Heart disease    Arthritis    Glaucoma    Hypercholesterolemia    Hypertension    Stroke ([place])    Shortness of breath    Diabetes mellitus     INSULIN DEPENDENT   GERD (gastroesophageal reflux disease)     Headache(784.0)    Anemia    Sleep apnea     uses cpap   Unspecified constipation    Acute kidney failure, unspecified (HCC)    Iron deficiency anemia, unspecified    CHF (congestive heart failure) (HCC)    Ischemic cardiomyopathy    Chronic systolic CHF (congestive heart failure) ([place])    Complete heart block ([place])    Past Surgical History  Procedure Laterality Date   Cardiac surgery     Back surgery     Cardiac catheterization  09/02/2012   Coronary angioplasty with stent placement  09/02/2012    RCA   Bi-ventricular pacemaker insertion (crt-p)  March 2014    [facility]   Percutaneous coronary stent intervention (pci-s) N/A 09/02/2012    Procedure: PERCUTANEOUS CORONARY STENT INTERVENTION (PCI-S);  Surgeon: Clent Demark, MD;  Location[facility] C[facility] LAB;  Service: Cardiovascular;  Laterality: N/A;   Temporary pacemaker insertion N/A 11/28/2012    Procedure: TEMPORARY PACEMAKER INSERTION;  Surgeon: Clent Demark, MD;  Location: [facility] [place] CATH LAB;  Service: Cardiovascular;  Laterality: N/A;   Permanent pacemaker insertion N/A 11/29/2012    Procedure: PERMANENT PACEMAKER INSERTION;  Surgeon: Deboraha Sprang, MD;  Location: [facility] CATH LAB;  Service: Cardiovascular;  Laterality: N/A;   Family History  Problem Relation Age of Onset   Diabetes Mother    Hypertension Mother    Hyperlipidemia Mother    Cancer Sister  Dementia Sister    Neuropathy Sister    Social History  Substance Use Topics   Smoking status: Former Smoker    Quit date: 12/26/1976   Smokeless tobacco: Never Used   Alcohol Use: No   OB History    Gravida Para Term Preterm AB TAB SAB Ectopic Multiple Living   4 4 4       3      Review of Systems  Constitutional: Negative for fever and chills.  Respiratory: Negative for cough and shortness of breath.   Cardiovascular: Negative for chest pain.  Gastrointestinal: Negative for nausea, vomiting, abdominal pain and diarrhea.  Musculoskeletal:  Positive for joint swelling.  Neurological: Positive for headaches.  Psychiatric/Behavioral: Negative for confusion.  All other systems reviewed and are negative.     Allergies  Review of patient's allergies indicates no known allergies.  Home Medications   Prior to Admission medications   Medication Sig Start Date End Date Taking? Authorizing Provider  acetaZOLAMIDE (DIAMOX) 250 MG tablet Take 250 mg by mouth 4 (four) times daily. 03/28/15   Historical Provider, MD  aspirin EC 81 MG tablet Take 81 mg by mouth daily.    Historical Provider, MD  atorvastatin (LIPITOR) 40 MG tablet Take 80 mg by mouth daily.     Historical Provider, MD  atorvastatin (LIPITOR) 80 MG tablet Take 80 mg by mouth daily. 04/17/15   Historical Provider, MD  BAYER CONTOUR TEST test strip  01/24/13   Historical Provider, MD  carvedilol (COREG) 3.125 MG tablet Take 3.125 mg by mouth 2 (two) times daily. 04/17/15   Historical Provider, MD  carvedilol (COREG) 6.25 MG tablet Take 1 tablet (6.25 mg total) by mouth 2 (two) times daily. 01/09/14   Deboraha Sprang, MD  clopidogrel (PLAVIX) 75 MG tablet Take 75 mg by mouth daily.    Historical Provider, MD  COMBIGAN 0.2-0.5 % ophthalmic solution Place 1 drop into both eyes every 12 (twelve) hours.  04/03/12   Historical Provider, MD  docusate sodium 100 MG CAPS Take 100 mg by mouth 2 (two) times daily. 10/22/12   Annita Brod, MD  dorzolamide (TRUSOPT) 2 % ophthalmic solution Place 1 drop into both eyes 2 (two) times daily.  07/28/13   Historical Provider, MD  ferrous sulfate 325 (65 FE) MG tablet Take 325 mg by mouth 2 (two) times daily.     Historical Provider, MD  HUMALOG KWIKPEN 100 UNIT/ML KiwkPen TAKE 10 UNITS BY SUBCUTANEOUS ROUTE 3 TIMES EVERY DAY WITH FOOD 04/10/15   Historical Provider, MD  insulin detemir (LEVEMIR) 100 UNIT/ML injection Inject 0.3 mLs (30 Units total) into the skin every morning. Provided by Dr Laurance Flatten from endocrinology Patient taking differently: Inject  32 Units into the skin at bedtime. Provided by Dr Laurance Flatten from endocrinology 09/14/13   Blanchie Serve, MD  latanoprost (XALATAN) 0.005 % ophthalmic solution Appointment OVERDUE, place 1 drop into both eyes daily at bedtime 03/02/14   Mahima Pandey, MD  LEVEMIR FLEXTOUCH 100 UNIT/ML Pen TAKE THIRTY-TWO UNIT(S) EVERY DAY 03/13/15   Historical Provider, MD  lubiprostone (AMITIZA) 24 MCG capsule Take 1 capsule (24 mcg total) by mouth 2 (two) times daily with a meal. 06/08/13   Blanchie Serve, MD  Multiple Vitamin (MULTIVITAMIN WITH MINERALS) TABS Take 1 tablet by mouth daily.    Historical Provider, MD  nitroGLYCERIN (NITROSTAT) 0.4 MG SL tablet Place 0.4 mg under the tongue every 5 (five) minutes as needed. Chest pains 09/03/12   Charolette Forward, MD  NOVOFINE 32G X 6 MM MISC  01/25/13   Historical Provider, MD  pantoprazole (PROTONIX) 40 MG tablet Take 40 mg by mouth daily.  05/07/15   Historical Provider, MD  pilocarpine (PILOCAR) 4 % ophthalmic solution Place 1 drop into both eyes 2 (two) times daily.  05/01/15   Historical Provider, MD  polyethylene glycol powder (GLYCOLAX/MIRALAX) powder TAKE 17 G BY MOUTH DAILY. HOLD FOR LOOSE STOOL 06/01/14   Blanchie Serve, MD  polyethylene glycol powder (GLYCOLAX/MIRALAX) powder APPOINTMENT OVERDUE, take 17G by mouth daily. Hold for loose stool 07/04/15   Lauree Chandler, NP  tobramycin (TOBREX) 0.3 % ophthalmic solution INSTILL 1 DROP INTO RIGHT EYE 4 TIMES A DAY 03/06/15   Historical Provider, MD  traMADol (ULTRAM) 50 MG tablet Take 1 tablet (50 mg total) by mouth every 6 (six) hours as needed. 11/28/13   Marcial Pacas, MD  valsartan-hydrochlorothiazide (DIOVAN-HCT) 320-25 MG per tablet Take by mouth daily. One half tablet daily    Historical Provider, MD   Triage Vitals: BP 149/65 mmHg   Pulse 74   Temp(Src) 98.3 F (36.8 C)   Resp 18   Ht 5' 4"  (1.626 m)   Wt 179 lb 3 oz (81.279 kg)   BMI 30.74 kg/m2   SpO2 97%   Physical Exam  Constitutional: She is oriented to person,  place, and time. She appears well-developed and well-nourished. No distress.  HENT:  Head: Normocephalic and atraumatic. Head is without raccoon's eyes and without Battle's sign.  Right Ear: No hemotympanum.  Left Ear: No hemotympanum.  Mouth/Throat: Oropharynx is clear and moist.  No breaks noted in skin   Eyes: EOM are normal. Pupils are equal, round, and reactive to light.  Neck: Normal range of motion. Neck supple.  Cardiovascular: Normal rate, regular rhythm and normal heart sounds.   Capillary refill less than 2 seconds to L finger tips 3 + radial pulse to L hand  Pulmonary/Chest: Effort normal and breath sounds normal. She has no wheezes. She has no rales.  Abdominal: Soft. Bowel sounds are normal. She exhibits no distension. There is no tenderness. There is no rebound and no guarding.  Musculoskeletal: Normal range of motion. She exhibits no edema.       Left wrist: She exhibits bony tenderness. She exhibits normal range of motion, no swelling, no effusion, no crepitus, no deformity and no laceration.       Left forearm: Normal.       Left hand: Normal. She exhibits normal capillary refill. Normal sensation noted. Normal strength noted.  No step off or crepitus of the C spine  No snuff box  Neurological: She is alert and oriented to person, place, and time. She has normal reflexes. No cranial nerve deficit.  Skin: Skin is warm and dry.  Psychiatric: She has a normal mood and affect. Judgment normal.  Nursing note and vitals reviewed.   ED Course  Procedures (including critical care time)  DIAGNOSTIC STUDIES: Oxygen Saturation is 97% on RA, adequate by my interpretation.    COORDINATION OF CARE: 3:13 AM- Will order imaging. Will give Tylenol. Discussed treatment plan with pt at bedside and pt agreed to plan.     Labs Review Labs Reviewed - No data to display  Imaging Review No results found. I have personally reviewed and evaluated these images and lab results as part  of my medical decision-making.   EKG Interpretation None      MDM   Final diagnoses:  None  Filed Vitals:   02/08/16 2248 02/09/16 0515  BP: 149/65 151/97  Pulse: 74 71  Temp: 98.3 F (36.8 C)   Resp: 18     Results for orders placed or performed in visit on 10/23/15  Implantable device - remote  Result Value Ref Range   Date Time Interrogation Session [id number]    Pulse Generator Manufacturer [employer]    Pulse Gen Model 3210 Anthem RF    Pulse Gen Serial Number [id number]    Implantable Pulse Generator Type Cardiac Resynch Therapy Pacemaker    Implantable Pulse Generator Implant Date [id number]    Implantable Lead Manufacturer [facility]    Implantable Lead Model 1258T    Implantable Lead Serial Number [id number]    Implantable Lead Implant Date [id number]    Implantable Lead Location [id number]    Implantable Lead Manufacturer [facility]    Implantable Lead Model E5135627    Implantable Lead Serial Number [id number]    Implantable Lead Implant Date [id number]    Implantable Lead Location [id number]    Implantable Lead Manufacturer [facility]    Implantable Lead Model E5135627    Implantable Lead Serial Number [id number]    Implantable Lead Implant Date [id number]    Implantable Lead Location [id number]    Lead Channel Setting Sensing Sensitivity 4.0 mV   Lead Channel Setting Sensing Adaptation Mode Fixed Pacing    Lead Channel Setting Pacing Amplitude 2.0 V   Lead Channel Setting Pacing Pulse Width 0.4 ms   Lead Channel Setting Pacing Amplitude 2.5 V   Lead Channel Setting Pacing Pulse Width 0.4 ms   Lead Channel Setting Pacing Amplitude 2.0 V   Lead Channel Setting Pacing Capture Mode Fixed Pacing    Lead Channel Status     Lead Channel Impedance Value 610 ohm   Lead Channel Pacing Threshold Amplitude 1.0 V   Lead Channel Pacing Threshold Pulse Width 0.4 ms   Lead Channel Status     Lead Channel Impedance Value 330 ohm   Lead Channel Sensing Intrinsic Amplitude 2.6 mV   Lead  Channel Pacing Threshold Amplitude 0.75 V   Lead Channel Pacing Threshold Pulse Width 0.4 ms   Lead Channel Status     Lead Channel Impedance Value 540 ohm   Lead Channel Sensing Intrinsic Amplitude 12.0 mV   Lead Channel Pacing Threshold Amplitude 1.0 V   Lead Channel Pacing Threshold Pulse Width 0.4 ms   Battery Status MOS    Battery Remaining Longevity 50 mo   Battery Remaining Percentage 57.0 %   Battery Voltage 2.89 V   Brady Statistic RA Percent Paced 96.0 %   Brady Statistic AP VP Percent 96.0 %   Brady Statistic AS VP Percent 4.3 %   Brady Statistic AP VS Percent 1.0 %   Brady Statistic AS VS Percent 1.0 %   Eval Rhythm ApBp    Dg Forearm Left  02/09/2016  CLINICAL DATA:  Pain after fall on Wednesday. EXAM: LEFT FOREARM - 2 VIEW COMPARISON:  None. FINDINGS: Fracture of the distal left radial metaphysis. See additional report of the left wrist, same date. Remainder of the left radius and ulna appear otherwise intact. No additional fractures are demonstrated. No dislocation at the elbow joint. Soft tissues are unremarkable. IMPRESSION: Fracture distal left radius as previously described. No additional fractures demonstrated. Electronically Signed   By: Lucienne Capers M.D.   On: 02/09/2016 04:09   Dg Wrist Complete Left  02/09/2016  CLINICAL DATA:  Pain after a fall on Wednesday.  EXAM: LEFT WRIST - COMPLETE 3+ VIEW COMPARISON:  02/06/2016 and 04/16/2009 FINDINGS: There is an oblique fracture at the distal left radius along the base of the radial styloid process. Fracture line extends to the radiocarpal joint surface. No significant displacement or angulation. Mild degenerative changes in the carpal bones. Soft tissue swelling. Vascular calcifications. IMPRESSION: Oblique nondisplaced fracture of the base of the left radial styloid process. Electronically Signed   By: Lucienne Capers M.D.   On: 02/09/2016 04:08   Ct Head Wo Contrast  02/09/2016  CLINICAL DATA:  Fall.  History of  stroke and diabetes. EXAM: CT HEAD WITHOUT CONTRAST TECHNIQUE: Contiguous axial images were obtained from the base of the skull through the vertex without intravenous contrast. COMPARISON:  10/16/2012 FINDINGS: Diffuse cerebral atrophy. Ventricular dilatation consistent with central atrophy. Low-attenuation changes in the deep white matter consistent small vessel ischemia. Subcutaneous scalp hematoma over the left posterior parietal region. No mass effect or midline shift. No abnormal extra-axial fluid collections. Gray-white matter junctions are distinct. Basal cisterns are not effaced. No evidence of acute intracranial hemorrhage. No depressed skull fractures. Visualized paranasal sinuses and mastoid air cells are not opacified. IMPRESSION: No acute intracranial abnormalities. Chronic atrophy and small vessel ischemic changes. Electronically Signed   By: Lucienne Capers M.D.   On: 02/09/2016 04:53   Dg Hand Complete Left  02/09/2016  CLINICAL DATA:  [place] on Wednesday, landing on the left hand. Worsening pain since the fall. EXAM: LEFT HAND - COMPLETE 3+ VIEW COMPARISON:  04/16/2009 FINDINGS: Diffuse bone demineralization. Degenerative changes in the interphalangeal joints. Old appearing ununited ossicle at the distal aspect middle phalanx left fourth finger. No acute fracture or dislocation demonstrated. Old fracture deformity of the proximal fifth metacarpal bone. Soft tissues are unremarkable. IMPRESSION: Degenerative changes in the left hand.  No acute bony abnormalities. Electronically Signed   By: Lucienne Capers M.D.   On: 02/09/2016 04:06   Mm Digital Screening Bilateral  01/15/2016  CLINICAL DATA:  Screening. EXAM: DIGITAL SCREENING BILATERAL MAMMOGRAM WITH CAD COMPARISON:  Previous exam(s). ACR Breast Density Category b: There are scattered areas of fibroglandular density. FINDINGS: There are no findings suspicious for malignancy. Images were processed with CAD. IMPRESSION: No mammographic  evidence of malignancy. A result letter of this screening mammogram will be mailed directly to the patient. RECOMMENDATION: Screening mammogram in one year. (Code:SM-B-01Y) BI-RADS CATEGORY  1: Negative. Electronically Signed   By: Margarette Canada M.D.   On: 01/15/2016 09:30    Follow up with your orthopedic surgeon for ongoing care.  Ice elevation and NSAIDs   I personally performed the services described in this documentation, which was scribed in my presence. The recorded information has been reviewed and is accurate.     Veatrice Kells, MD 02/09/16 [phone]

## 2016-02-09 NOTE — Discharge Instructions (Signed)
Cast or Splint Care Casts and splints support injured limbs and keep bones from moving while they heal. It is important to care for your cast or splint at home.  HOME CARE INSTRUCTIONS  Keep the cast or splint uncovered during the drying period. It can take 24 to 48 hours to dry if it is made of plaster. A fiberglass cast will dry in less than 1 hour.  Do not rest the cast on anything harder than a pillow for the first 24 hours.  Do not put weight on your injured limb or apply pressure to the cast until your health care provider gives you permission.  Keep the cast or splint dry. Wet casts or splints can lose their shape and may not support the limb as well. A wet cast that has lost its shape can also create harmful pressure on your skin when it dries. Also, wet skin can become infected.  Cover the cast or splint with a plastic bag when bathing or when out in the rain or snow. If the cast is on the trunk of the body, take sponge baths until the cast is removed.  If your cast does become wet, dry it with a towel or a blow dryer on the cool setting only.  Keep your cast or splint clean. Soiled casts may be wiped with a moistened cloth.  Do not place any hard or soft foreign objects under your cast or splint, such as cotton, toilet paper, lotion, or powder.  Do not try to scratch the skin under the cast with any object. The object could get stuck inside the cast. Also, scratching could lead to an infection. If itching is a problem, use a blow dryer on a cool setting to relieve discomfort.  Do not trim or cut your cast or remove padding from inside of it.  Exercise all joints next to the injury that are not immobilized by the cast or splint. For example, if you have a long leg cast, exercise the hip joint and toes. If you have an arm cast or splint, exercise the shoulder, elbow, thumb, and fingers.  Elevate your injured arm or leg on 1 or 2 pillows for the first 1 to 3 days to decrease  swelling and pain.It is best if you can comfortably elevate your cast so it is higher than your heart. SEEK MEDICAL CARE IF:   Your cast or splint cracks.  Your cast or splint is too tight or too loose.  You have unbearable itching inside the cast.  Your cast becomes wet or develops a soft spot or area.  You have a bad smell coming from inside your cast.  You get an object stuck under your cast.  Your skin around the cast becomes red or raw.  You have new pain or worsening pain after the cast has been applied. SEEK IMMEDIATE MEDICAL CARE IF:   You have fluid leaking through the cast.  You are unable to move your fingers or toes.  You have discolored (blue or white), cool, painful, or very swollen fingers or toes beyond the cast.  You have tingling or numbness around the injured area.  You have severe pain or pressure under the cast.  You have any difficulty with your breathing or have shortness of breath.  You have chest pain.   This information is not intended to replace advice given to you by your health care provider. Make sure you discuss any questions you have with your health care  provider.   Document Released: 10/03/2000 Document Revised: 07/27/2013 Document Reviewed: 04/14/2013 Elsevier Interactive Patient Education [employer].

## 2016-02-26 ENCOUNTER — Ambulatory Visit: Payer: Medicare Other | Admitting: Podiatry

## 2016-03-12 ENCOUNTER — Encounter: Payer: Self-pay | Admitting: Cardiology

## 2016-03-12 LAB — CUP PACEART REMOTE DEVICE CHECK
Battery Remaining Percentage: 51 %
Battery Voltage: 2.87 V
Brady Statistic AP VP Percent: 95 %
Date Time Interrogation Session: 20170404061951
Implantable Lead Implant Date: 20140210
Implantable Lead Location: 753860
Lead Channel Pacing Threshold Amplitude: 0.75 V
Lead Channel Sensing Intrinsic Amplitude: 3.8 mV
Lead Channel Setting Pacing Amplitude: 2.5 V
Lead Channel Setting Pacing Pulse Width: 0.4 ms
Lead Channel Setting Sensing Sensitivity: 4 mV
MDC IDC LEAD IMPLANT DT: 20140210
MDC IDC LEAD IMPLANT DT: 20140210
MDC IDC LEAD LOCATION: 753858
MDC IDC LEAD LOCATION: 753859
MDC IDC MSMT BATTERY REMAINING LONGEVITY: 44 mo
MDC IDC MSMT LEADCHNL LV IMPEDANCE VALUE: 640 Ohm
MDC IDC MSMT LEADCHNL LV PACING THRESHOLD AMPLITUDE: 1 V
MDC IDC MSMT LEADCHNL LV PACING THRESHOLD PULSEWIDTH: 0.4 ms
MDC IDC MSMT LEADCHNL RA IMPEDANCE VALUE: 330 Ohm
MDC IDC MSMT LEADCHNL RA PACING THRESHOLD PULSEWIDTH: 0.4 ms
MDC IDC MSMT LEADCHNL RV IMPEDANCE VALUE: 510 Ohm
MDC IDC MSMT LEADCHNL RV PACING THRESHOLD AMPLITUDE: 1 V
MDC IDC MSMT LEADCHNL RV PACING THRESHOLD PULSEWIDTH: 0.4 ms
MDC IDC MSMT LEADCHNL RV SENSING INTR AMPL: 12 mV
MDC IDC SET LEADCHNL LV PACING AMPLITUDE: 2 V
MDC IDC SET LEADCHNL RA PACING AMPLITUDE: 2 V
MDC IDC SET LEADCHNL RV PACING PULSEWIDTH: 0.4 ms
MDC IDC STAT BRADY AP VS PERCENT: 1 %
MDC IDC STAT BRADY AS VP PERCENT: 4.6 %
MDC IDC STAT BRADY AS VS PERCENT: 1 %
MDC IDC STAT BRADY RA PERCENT PACED: 95 %
Pulse Gen Model: 3210
Pulse Gen Serial Number: 2882222

## 2016-03-21 ENCOUNTER — Ambulatory Visit (INDEPENDENT_AMBULATORY_CARE_PROVIDER_SITE_OTHER): Payer: Medicare Other | Admitting: Podiatry

## 2016-03-21 ENCOUNTER — Encounter: Payer: Self-pay | Admitting: Podiatry

## 2016-03-21 VITALS — BP 157/68 | HR 72

## 2016-03-21 DIAGNOSIS — M79606 Pain in leg, unspecified: Secondary | ICD-10-CM

## 2016-03-21 DIAGNOSIS — B351 Tinea unguium: Secondary | ICD-10-CM | POA: Diagnosis not present

## 2016-03-21 NOTE — Patient Instructions (Signed)
Seen for hypertrophic nails. All nails debrided. Return in 3 months or as needed.  

## 2016-03-21 NOTE — Progress Notes (Signed)
Subjective: 82 year old female presents complaining of painful nails and feet on top of both feet. Feel like they are swollen. Having left wrist wrapped with Ace from a recent fall.   Objective: Dermatologic: No open skin lesions noted bilaterally The toenails are hypertrophic, elongated, brittle, deformed and tender to direct palpation x 10. Vascular: Pedal pulses are not palpable. No edema or erythema noted.  Neurologic: Subjective tingling sensation on toes at times. Protective sensory perception intact. Orthopedic: Hypermobile first ray bilateral. Forefoot varus with forefoot loading and rearfoot placed in neutral.  Assessment: Type II diabetic Symptomatic mycotic nails x 10. Pain in both feet with feeling of swelling.  Plan: Debrided all nails 10. Return in 3 months for RFC.

## 2016-04-01 ENCOUNTER — Other Ambulatory Visit: Payer: Self-pay | Admitting: Internal Medicine

## 2016-04-01 DIAGNOSIS — Z8781 Personal history of (healed) traumatic fracture: Secondary | ICD-10-CM

## 2016-04-16 ENCOUNTER — Inpatient Hospital Stay: Admission: RE | Admit: 2016-04-16 | Payer: Medicare Other | Source: Ambulatory Visit

## 2016-04-16 ENCOUNTER — Other Ambulatory Visit: Payer: Self-pay | Admitting: Internal Medicine

## 2016-04-16 DIAGNOSIS — Z1231 Encounter for screening mammogram for malignant neoplasm of breast: Secondary | ICD-10-CM

## 2016-04-23 ENCOUNTER — Ambulatory Visit (INDEPENDENT_AMBULATORY_CARE_PROVIDER_SITE_OTHER): Payer: Medicare Other | Admitting: *Deleted

## 2016-04-23 DIAGNOSIS — I442 Atrioventricular block, complete: Secondary | ICD-10-CM

## 2016-04-23 NOTE — Progress Notes (Signed)
Remote pacemaker transmission.   

## 2016-04-25 ENCOUNTER — Ambulatory Visit
Admission: RE | Admit: 2016-04-25 | Discharge: 2016-04-25 | Disposition: A | Discharge status: Home / Self Care | Payer: Medicare Other | Source: Ambulatory Visit | Attending: Internal Medicine | Admitting: Internal Medicine

## 2016-04-25 DIAGNOSIS — Z8781 Personal history of (healed) traumatic fracture: Secondary | ICD-10-CM

## 2016-04-25 DIAGNOSIS — Z1231 Encounter for screening mammogram for malignant neoplasm of breast: Secondary | ICD-10-CM

## 2016-04-29 LAB — CUP PACEART REMOTE DEVICE CHECK
Brady Statistic AP VP Percent: 95 %
Brady Statistic AP VS Percent: 1 %
Brady Statistic AS VP Percent: 4.7 %
Brady Statistic AS VS Percent: 1 %
Brady Statistic RA Percent Paced: 95 %
Date Time Interrogation Session: 20170705064925
Implantable Lead Implant Date: 20140210
Implantable Lead Location: 753860
Lead Channel Impedance Value: 330 Ohm
Lead Channel Pacing Threshold Amplitude: 0.75 V
Lead Channel Pacing Threshold Amplitude: 1 V
Lead Channel Pacing Threshold Amplitude: 1 V
Lead Channel Pacing Threshold Pulse Width: 0.4 ms
Lead Channel Pacing Threshold Pulse Width: 0.4 ms
Lead Channel Sensing Intrinsic Amplitude: 3.2 mV
Lead Channel Setting Pacing Amplitude: 2.5 V
Lead Channel Setting Pacing Pulse Width: 0.4 ms
Lead Channel Setting Pacing Pulse Width: 0.4 ms
Lead Channel Setting Sensing Sensitivity: 4 mV
MDC IDC LEAD IMPLANT DT: 20140210
MDC IDC LEAD IMPLANT DT: 20140210
MDC IDC LEAD LOCATION: 753858
MDC IDC LEAD LOCATION: 753859
MDC IDC MSMT BATTERY REMAINING LONGEVITY: 45 mo
MDC IDC MSMT BATTERY REMAINING PERCENTAGE: 51 %
MDC IDC MSMT BATTERY VOLTAGE: 2.87 V
MDC IDC MSMT LEADCHNL LV IMPEDANCE VALUE: 640 Ohm
MDC IDC MSMT LEADCHNL RA PACING THRESHOLD PULSEWIDTH: 0.4 ms
MDC IDC MSMT LEADCHNL RV IMPEDANCE VALUE: 540 Ohm
MDC IDC MSMT LEADCHNL RV SENSING INTR AMPL: 12 mV
MDC IDC PG SERIAL: 2882222
MDC IDC SET LEADCHNL LV PACING AMPLITUDE: 2 V
MDC IDC SET LEADCHNL RA PACING AMPLITUDE: 2 V

## 2016-04-30 ENCOUNTER — Encounter: Payer: Self-pay | Admitting: Cardiology

## 2016-05-30 ENCOUNTER — Ambulatory Visit: Payer: Medicare Other | Admitting: Neurology

## 2016-06-06 ENCOUNTER — Encounter: Payer: Self-pay | Admitting: Neurology

## 2016-06-06 ENCOUNTER — Ambulatory Visit (INDEPENDENT_AMBULATORY_CARE_PROVIDER_SITE_OTHER): Payer: Medicare Other | Admitting: Neurology

## 2016-06-06 VITALS — BP 159/83 | HR 69 | Ht 64.0 in | Wt 171.5 lb

## 2016-06-06 DIAGNOSIS — R413 Other amnesia: Secondary | ICD-10-CM

## 2016-06-06 DIAGNOSIS — H5310 Unspecified subjective visual disturbances: Secondary | ICD-10-CM

## 2016-06-06 DIAGNOSIS — E538 Deficiency of other specified B group vitamins: Secondary | ICD-10-CM | POA: Diagnosis not present

## 2016-06-06 DIAGNOSIS — R51 Headache: Secondary | ICD-10-CM | POA: Diagnosis not present

## 2016-06-06 DIAGNOSIS — G4486 Cervicogenic headache: Secondary | ICD-10-CM

## 2016-06-06 NOTE — Patient Instructions (Signed)
   We will check a CT of the head and get blood work today. We will check a test for the eye nerves called a visual evoked response test.

## 2016-06-06 NOTE — Progress Notes (Signed)
Reason for visit: subjective visual disturbance  Referring physician: Dr. Beverly Milch is a 82 y.o. female  History of present illness:  Ms. Cockerham is an 82 year-old black female with a history of glaucoma and bilateral visual disturbance, left greater than right. The patient has lost some peripheral vision, she has required surgery on both eyes. The patient comes in today accompanied by her daughter, neither the patient or the daughter are excellent historians. It appears that the patient fell and bumped her head on 02/07/2016, a head CT scan at that time was unremarkable. It is not clear when the visual changes began, but the daughter indicates that she began having changes after eye surgery in April. The patient began having episodes of seeing red spots in the visual fields bilaterally, the patient indicates that this affected both eyes not one eye or the other. The patient indicates that the episodes will last about 5 minutes and clear, but she has not had any episodes for over one week. She denies any other symptoms associated with the visual complaints such as numbness, weakness, confusion, dizziness, or syncope. Since the fall, she has had some increase in neck pain, and left shoulder discomfort, and headaches that come up from the back of the head. The patient fractured the left wrist with the fall in April as well. The patient may have some dizziness with standing, this goes away when sits down. She denies any focal numbness or weakness of the extremities. The patient has had some progression of memory problems over the last one year according to the daughter. The patient has been seen by her ophthalmologist, apparently they have indicated that there is no eye pathology that correlates with the visual complaints. She is sent to this office for an evaluation.  Past Medical History:  Diagnosis Date  . Acute kidney failure, unspecified ([place])   . Anemia   . Arthritis   .  Celiac disease   . CHF (congestive heart failure) ([place])   . Chronic systolic CHF (congestive heart failure) ([place])   . Complete heart block ([place])   . Coronary artery disease   . Diabetes mellitus    INSULIN DEPENDENT  . GERD (gastroesophageal reflux disease)   . Glaucoma   . Headache(784.0)   . Heart disease   . Hypercholesterolemia   . Hypertension   . Iron deficiency anemia, unspecified   . Ischemic cardiomyopathy   . Shortness of breath   . Sleep apnea    uses cpap  . Stroke ([place])   . Unspecified constipation     Past Surgical History:  Procedure Laterality Date  . BACK SURGERY    . BI-VENTRICULAR PACEMAKER INSERTION (CRT-P)  March 2014   [facility]  . CARDIAC CATHETERIZATION  09/02/2012  . CARDIAC SURGERY    . CORONARY ANGIOPLASTY WITH STENT PLACEMENT  09/02/2012   RCA  . PERCUTANEOUS CORONARY STENT INTERVENTION (PCI-S) N/A 09/02/2012   Procedure: PERCUTANEOUS CORONARY STENT INTERVENTION (PCI-S);  Surgeon: Clent Demark, MD;  Location: [facility] CATH LAB;  Service: Cardiovascular;  Laterality: N/A;  . PERMANENT PACEMAKER INSERTION N/A 11/29/2012   Procedure: PERMANENT PACEMAKER INSERTION;  Surgeon: Deboraha Sprang, MD;  Location: [facility] CATH LAB;  Service: Cardiovascular;  Laterality: N/A;  . TEMPORARY PACEMAKER INSERTION N/A 11/28/2012   Procedure: TEMPORARY PACEMAKER INSERTION;  Surgeon: Clent Demark, MD;  Location: [facility] CATH LAB;  Service: Cardiovascular;  Laterality: N/A;    Family History  Problem Relation Age of  Onset  . Diabetes Mother   . Hypertension Mother   . Hyperlipidemia Mother   . Cancer Sister   . Dementia Sister   . Neuropathy Sister     Social history:  reports that she quit smoking about 39 years ago. She has never used smokeless tobacco. She reports that she does not drink alcohol or use drugs.  Medications:  Prior to Admission medications   Medication Sig Start Date End Date Taking? Authorizing Provider  acetaZOLAMIDE (DIAMOX) 250 MG tablet Take  250 mg by mouth 4 (four) times daily. 03/28/15  Yes Historical Provider, MD  aspirin EC 81 MG tablet Take 81 mg by mouth daily.   Yes Historical Provider, MD  atorvastatin (LIPITOR) 80 MG tablet Take 80 mg by mouth daily. 04/17/15  Yes Historical Provider, MD  BAYER CONTOUR TEST test strip  01/24/13  Yes Historical Provider, MD  carvedilol (COREG) 3.125 MG tablet Take 3.125 mg by mouth 2 (two) times daily. 04/17/15  Yes Historical Provider, MD  carvedilol (COREG) 6.25 MG tablet Take 1 tablet (6.25 mg total) by mouth 2 (two) times daily. 01/09/14  Yes Deboraha Sprang, MD  clopidogrel (PLAVIX) 75 MG tablet Take 75 mg by mouth daily.   Yes Historical Provider, MD  COMBIGAN 0.2-0.5 % ophthalmic solution Place 1 drop into both eyes every 12 (twelve) hours.  04/03/12  Yes Historical Provider, MD  docusate sodium 100 MG CAPS Take 100 mg by mouth 2 (two) times daily. 10/22/12  Yes Annita Brod, MD  dorzolamide (TRUSOPT) 2 % ophthalmic solution Place 1 drop into both eyes 2 (two) times daily.  07/28/13  Yes Historical Provider, MD  ferrous sulfate 325 (65 FE) MG tablet Take 325 mg by mouth 2 (two) times daily.    Yes Historical Provider, MD  HUMALOG KWIKPEN 100 UNIT/ML KiwkPen TAKE 10 UNITS BY SUBCUTANEOUS ROUTE 3 TIMES EVERY DAY WITH FOOD 04/10/15  Yes Historical Provider, MD  insulin detemir (LEVEMIR) 100 UNIT/ML injection Inject 0.3 mLs (30 Units total) into the skin every morning. Provided by Dr Laurance Flatten from endocrinology Patient taking differently: Inject 31 Units into the skin at bedtime. Provided by Dr Laurance Flatten from endocrinology 09/14/13  Yes Blanchie Serve, MD  latanoprost (XALATAN) 0.005 % ophthalmic solution Appointment OVERDUE, place 1 drop into both eyes daily at bedtime 03/02/14  Yes Mahima Bubba Camp, MD  lubiprostone (AMITIZA) 24 MCG capsule Take 1 capsule (24 mcg total) by mouth 2 (two) times daily with a meal. 06/08/13  Yes Blanchie Serve, MD  Multiple Vitamin (MULTIVITAMIN WITH MINERALS) TABS Take 1 tablet by  mouth daily.   Yes Historical Provider, MD  nitroGLYCERIN (NITROSTAT) 0.4 MG SL tablet Place 0.4 mg under the tongue every 5 (five) minutes as needed. Chest pains 09/03/12  Yes Charolette Forward, MD  NOVOFINE 32G X 6 MM MISC  01/25/13  Yes Historical Provider, MD  ofloxacin (OCUFLOX) 0.3 % ophthalmic solution  05/01/16  Yes Historical Provider, MD  pantoprazole (PROTONIX) 40 MG tablet Take 40 mg by mouth daily.  05/07/15  Yes Historical Provider, MD  pilocarpine (PILOCAR) 4 % ophthalmic solution Place 1 drop into both eyes 2 (two) times daily.  05/01/15  Yes Historical Provider, MD  polyethylene glycol powder (GLYCOLAX/MIRALAX) powder TAKE 17 G BY MOUTH DAILY. HOLD FOR LOOSE STOOL 06/01/14  Yes Mahima Pandey, MD  polyethylene glycol powder (GLYCOLAX/MIRALAX) powder APPOINTMENT OVERDUE, take 17G by mouth daily. Hold for loose stool 07/04/15  Yes Lauree Chandler, NP  prednisoLONE acetate (PRED FORTE)  1 % ophthalmic suspension  05/01/16  Yes Historical Provider, MD  valsartan-hydrochlorothiazide (DIOVAN-HCT) 320-25 MG per tablet Take by mouth daily. One half tablet daily   Yes Historical Provider, MD     No Known Allergies  ROS:  Out of a complete 14 system review of symptoms, the patient complains only of the following symptoms, and all other reviewed systems are negative.  Weight loss, fatigue Swelling in the legs Hearing loss Birthmarks, moles Blurred vision, eye pain Shortness of breath, snoring Constipation Anemia, easy bruising Feeling hot Joint pain, joint swelling, aching muscles Allergies Memory loss, numbness, weakness, dizziness Decreased energy Snoring, restless legs  Blood pressure (!) 159/83, pulse 69, height 5' 4"  (1.626 m), weight 171 lb 8 oz (77.8 kg).  Physical Exam  General: The patient is alert and cooperative at the time of the examination.  Eyes: Pupils are equal, round, and reactive to light. Discs are flat bilaterally.  Neck: The neck is supple, no carotid bruits  are noted.  Respiratory: The respiratory examination is clear.  Cardiovascular: The cardiovascular examination reveals a regular rate and rhythm, no obvious murmurs or rubs are noted.  Skin: Extremities are with 1+ edema at the ankles bilaterally.  Neurologic Exam  Mental status: The patient is alert and oriented x 3 at the time of the examination. The patient has apparent normal recent and remote memory, with an apparently normal attention span and concentration ability.  Cranial nerves: Facial symmetry is present. There is good sensation of the face to pinprick and soft touch bilaterally. The strength of the facial muscles and the muscles to head turning and shoulder shrug are normal bilaterally. Speech is well enunciated, no aphasia or dysarthria is noted. Extraocular movements are full. Visual fields are full. The tongue is midline, and the patient has symmetric elevation of the soft palate. No obvious hearing deficits are noted.  Motor: The motor testing reveals 5 over 5 strength of all 4 extremities. Good symmetric motor tone is noted throughout.  Sensory: Sensory testing is intact to pinprick, soft touch, vibration sensation, and position sense on all 4 extremities, with exception of some decrease in position sense of the right foot. No evidence of extinction is noted.  Coordination: Cerebellar testing reveals good finger-nose-finger and heel-to-shin bilaterally.apraxia with the use of the extremities is noted.  Gait and station: Gait is slightly wide-based, the patient can walk independently. Tandem gait was not attempted. Romberg is negative.  Reflexes: Deep tendon reflexes are symmetric, but are depressed bilaterally. Toes are downgoing bilaterally.   CT head 02/09/16:  IMPRESSION: No acute intracranial abnormalities. Chronic atrophy and small vessel ischemic changes.  * CT scan images were reviewed online. I agree with the written report.    Assessment/Plan:  1.  Subjective visual disturbance, intermittent  2. Memory disturbance  3. Cervicogenic headache  The patient does have intrinsic eye and retinal disease. The history suggests that the visual complaints came on not after the fall, but after eye surgery. The patient will be set for some blood work today, she will have a visual evoked response test, and a repeat CT scan of the head. The patient also complains of cervicogenic headache, if this remains a significant issue, low-dose gabapentin can be added in the future. We will contact the patient concerning the results of the above evaluation.  Jill Alexanders MD 06/06/2016 9:48 AM  [facility] [address] [place], [place] [address]  Phone [phone] Fax [phone]

## 2016-06-08 LAB — ANGIOTENSIN CONVERTING ENZYME: Angio Convert Enzyme: 29 U/L (ref 14–82)

## 2016-06-08 LAB — RPR: RPR Ser Ql: NONREACTIVE

## 2016-06-08 LAB — SEDIMENTATION RATE: Sed Rate: 9 mm/hr (ref 0–40)

## 2016-06-08 LAB — VITAMIN B12: Vitamin B-12: 814 pg/mL (ref 211–946)

## 2016-06-09 ENCOUNTER — Telehealth: Payer: Self-pay | Admitting: *Deleted

## 2016-06-09 NOTE — Telephone Encounter (Signed)
Attempted to reach patient to give lab results. Unable to leave message; voice mailbox not set up.

## 2016-06-24 ENCOUNTER — Ambulatory Visit: Payer: Medicare Other | Admitting: Podiatry

## 2016-06-30 ENCOUNTER — Encounter: Payer: Self-pay | Admitting: Podiatry

## 2016-06-30 ENCOUNTER — Ambulatory Visit (INDEPENDENT_AMBULATORY_CARE_PROVIDER_SITE_OTHER): Payer: Medicare Other | Admitting: Podiatry

## 2016-06-30 DIAGNOSIS — B351 Tinea unguium: Secondary | ICD-10-CM

## 2016-06-30 DIAGNOSIS — M79673 Pain in unspecified foot: Secondary | ICD-10-CM | POA: Diagnosis not present

## 2016-06-30 DIAGNOSIS — M79606 Pain in leg, unspecified: Secondary | ICD-10-CM

## 2016-06-30 NOTE — Progress Notes (Signed)
Subjective: 82 year old female presents complaining of painful nails and request nails trimmed.  Objective: Dermatologic: No open skin lesions noted bilaterally The toenails are hypertrophic, elongated, brittle, deformed and tender to direct palpation x 10. Vascular: Pedal pulses are not palpable. No edema or erythema noted.  Neurologic: Subjective tingling sensation on toes at times. Protective sensory perception intact. Orthopedic: Hypermobile first ray bilateral. Forefoot varus with forefoot loading and rearfoot placed in neutral.  Assessment: Type II diabetic Symptomatic mycotic nails x 10.  Plan: Debrided all nails 10. Return in 3 months for RFC.

## 2016-06-30 NOTE — Patient Instructions (Signed)
Seen for hypertrophic nails. All nails debrided. Return in 3 months or as needed.  

## 2016-07-08 ENCOUNTER — Telehealth: Payer: Self-pay | Admitting: Neurology

## 2016-07-08 ENCOUNTER — Ambulatory Visit (INDEPENDENT_AMBULATORY_CARE_PROVIDER_SITE_OTHER): Payer: Medicare Other | Admitting: Neurology

## 2016-07-08 DIAGNOSIS — R51 Headache: Secondary | ICD-10-CM

## 2016-07-08 DIAGNOSIS — R413 Other amnesia: Secondary | ICD-10-CM

## 2016-07-08 DIAGNOSIS — G4486 Cervicogenic headache: Secondary | ICD-10-CM

## 2016-07-08 DIAGNOSIS — H5310 Unspecified subjective visual disturbances: Secondary | ICD-10-CM

## 2016-07-08 NOTE — Telephone Encounter (Signed)
I called and talked with a caretaker. The visual evoked response test was slowed bilaterally. This suggests bilateral optic nerve or optic nerve chiasm abnormalities.   CT of the head has been ordered, this has not yet been done.

## 2016-07-08 NOTE — Procedures (Signed)
    History:   [name] is an 82 year old patient with a history of glaucoma, reduction peripheral vision, with some alteration in vision after eye surgery in April 2017. The patient is being evaluated for this vision change.   Description: The visual evoked response test was performed today using 32 x 32 check sizes, 16 x 16 check sizes and no response was seen. A response was seen at 8 x 8 check sizes. The absolute latencies for the N1 and the P100 wave forms were within prolonged bilaterally. The amplitudes for the P100 wave forms were also within normal limits bilaterally. The visual acuity was 20/70 OD and 20/70 OS uncorrected.  Impression:  The visual evoked response test above was within abnormal bilaterally. Conduction slowing was seen within the anterior visual pathways on both sides on today's evaluation.  This study would suggest and abnormality in the anterior visual pathways bilaterally, or a single lesion in the optic nerve chiasm. Clinical correlation is required.

## 2016-07-15 ENCOUNTER — Other Ambulatory Visit: Payer: Self-pay | Admitting: Neurology

## 2016-07-16 ENCOUNTER — Ambulatory Visit
Admission: RE | Admit: 2016-07-16 | Discharge: 2016-07-16 | Disposition: A | Discharge status: Home / Self Care | Payer: Medicare Other | Source: Ambulatory Visit | Attending: Neurology | Admitting: Neurology

## 2016-07-16 DIAGNOSIS — H53123 Transient visual loss, bilateral: Secondary | ICD-10-CM | POA: Diagnosis not present

## 2016-07-16 DIAGNOSIS — H5310 Unspecified subjective visual disturbances: Secondary | ICD-10-CM | POA: Diagnosis not present

## 2016-07-17 ENCOUNTER — Telehealth: Payer: Self-pay | Admitting: Neurology

## 2016-07-17 MED ORDER — GABAPENTIN 100 MG PO CAPS: 100.0000 mg | ORAL_CAPSULE | Freq: Two times a day (BID) | ORAL | 2 refills | Status: DC

## 2016-07-17 NOTE — Telephone Encounter (Signed)
  I called patient. The CT the head is relatively unremarkable, no explanation for the vision changes, the visual evoked response test was abnormal bilaterally, she may have intrinsic eye disease or optic nerve disease. The patient still complains some headache and neck pain, I will add low-dose gabapentin to her regimen.   CT head 07/17/16:  IMPRESSION:  Abnormal CT head (without) demonstrating: 1. Mild generalized atrophy and mild chronic small vessel ischemic disease. 2. Post-surgical globes bilaterally. 3. No acute findings. No significant change from CT on 02/09/16.

## 2016-08-19 ENCOUNTER — Telehealth: Payer: Self-pay | Admitting: Neurology

## 2016-08-19 ENCOUNTER — Other Ambulatory Visit: Payer: Self-pay

## 2016-08-19 MED ORDER — GABAPENTIN 100 MG PO CAPS: 100.0000 mg | ORAL_CAPSULE | Freq: Two times a day (BID) | ORAL | 3 refills | Status: DC

## 2016-08-19 MED ORDER — DONEPEZIL HCL 5 MG PO TABS: 5.0000 mg | ORAL_TABLET | Freq: Every day | ORAL | 2 refills | Status: DC

## 2016-08-19 NOTE — Telephone Encounter (Signed)
I called the patient and talked with the daughter. The patient has had some increased problems with memory recently, they would like to go on a low dose of Aricept, I will call in a prescription for this.  I discussed the potential side effects with the daughter. They are to call me if the patient is having problems on the medication.

## 2016-08-19 NOTE — Telephone Encounter (Signed)
90 day rx e-scribed per faxed request from pharmacy.

## 2016-08-19 NOTE — Telephone Encounter (Signed)
Pt's daughter [name] called in requesting Aricept. She states her mother is becoming very confused. Please call [phone]

## 2016-08-25 ENCOUNTER — Telehealth: Payer: Self-pay | Admitting: Internal Medicine

## 2016-08-25 NOTE — Telephone Encounter (Signed)
Pt.has an appt in January.  She is concerned that she was to come in before that.  I wanted to check with you to see if there are any times we can work her in if necessary. Please call patient back (or let me know) how to proceed.

## 2016-09-29 ENCOUNTER — Ambulatory Visit (INDEPENDENT_AMBULATORY_CARE_PROVIDER_SITE_OTHER): Payer: Medicare Other | Admitting: Podiatry

## 2016-09-29 ENCOUNTER — Encounter: Payer: Self-pay | Admitting: Podiatry

## 2016-09-29 DIAGNOSIS — B351 Tinea unguium: Secondary | ICD-10-CM

## 2016-09-29 DIAGNOSIS — M79676 Pain in unspecified toe(s): Secondary | ICD-10-CM

## 2016-09-29 NOTE — Progress Notes (Signed)
Subjective: 82 year old female presents complaining of painful nails and request nails trimmed. Patient denies any new problems.  Objective: Dermatologic: No open skin lesions noted bilaterally The toenails are hypertrophic, elongated, brittle, deformed and tender to direct palpation x 10. Vascular: Pedal pulses are not palpable. No edema or erythema noted.  Neurologic: Subjective tingling sensation on toes at times. Protective sensory perception intact. Orthopedic: Hypermobile first ray bilateral. Forefoot varus with forefoot loading and rearfoot placed in neutral.  Assessment: Type II diabetic Painful mycotic nails x 10.  Plan: Debrided all nails 10. Return in 3 months for RFC.

## 2016-09-29 NOTE — Patient Instructions (Signed)
Seen for hypertrophic nails. All nails debrided. Return in 3 months or as needed.  

## 2016-10-08 ENCOUNTER — Telehealth: Payer: Self-pay | Admitting: Neurology

## 2016-10-08 NOTE — Telephone Encounter (Signed)
Pt was seen in August due to headaches and subjective visual field disturbance. VER results were slow bilaterally, suggestive of optic nerve abnormalities. CT w/ no acute findings. Pt was prescribed gabapentin 100 mg BID as well as Aricept 5 mg for memory. She does not have a follow-up appt scheduled.

## 2016-10-08 NOTE — Telephone Encounter (Signed)
Patient's daughter is calling. The patient is still seeing dots and spots but now she is experiencing a funny feeling in her head down to the back of her head, neck and shoulders. Please call to discuss.

## 2016-10-08 NOTE — Telephone Encounter (Signed)
I called the patient, talk with the daughter. The patient has a history of headaches, but the daughter states that she is having an unusual sensation in the head, not a true headache. The headaches previously were coming up from the neck. The patient continues to see spots in front of her eyes, there is evidence of bilateral optic nerve dysfunction.  I will get a revisit for this patient.

## 2016-10-15 NOTE — Telephone Encounter (Signed)
Called and spoke to pt's daughter. Reports that pt is doing about the same. Appt scheduled next Wed at 9 am.

## 2016-10-22 ENCOUNTER — Ambulatory Visit: Payer: Self-pay | Admitting: Neurology

## 2016-10-22 ENCOUNTER — Telehealth: Payer: Self-pay | Admitting: Neurology

## 2016-10-22 NOTE — Telephone Encounter (Signed)
This patient did not show for a revisit appointment today. 

## 2016-10-23 ENCOUNTER — Encounter: Payer: Self-pay | Admitting: Neurology

## 2016-10-27 ENCOUNTER — Encounter (INDEPENDENT_AMBULATORY_CARE_PROVIDER_SITE_OTHER): Payer: Medicare Other | Admitting: Internal Medicine

## 2016-10-27 DIAGNOSIS — I442 Atrioventricular block, complete: Secondary | ICD-10-CM

## 2016-11-09 ENCOUNTER — Other Ambulatory Visit: Payer: Self-pay | Admitting: Neurology

## 2016-11-20 ENCOUNTER — Other Ambulatory Visit: Payer: Self-pay | Admitting: *Deleted

## 2016-11-20 MED ORDER — DONEPEZIL HCL 5 MG PO TABS: ORAL_TABLET | ORAL | 0 refills | Status: DC

## 2016-12-24 ENCOUNTER — Encounter: Payer: Medicare Other | Admitting: Internal Medicine

## 2016-12-26 ENCOUNTER — Encounter: Payer: Self-pay | Admitting: Internal Medicine

## 2016-12-26 ENCOUNTER — Ambulatory Visit (INDEPENDENT_AMBULATORY_CARE_PROVIDER_SITE_OTHER): Payer: Medicare Other | Admitting: Internal Medicine

## 2016-12-26 VITALS — BP 122/64 | HR 76 | Ht 64.0 in | Wt 169.0 lb

## 2016-12-26 DIAGNOSIS — I255 Ischemic cardiomyopathy: Secondary | ICD-10-CM

## 2016-12-26 DIAGNOSIS — I442 Atrioventricular block, complete: Secondary | ICD-10-CM | POA: Diagnosis not present

## 2016-12-26 DIAGNOSIS — Z950 Presence of cardiac pacemaker: Secondary | ICD-10-CM

## 2016-12-26 LAB — CUP PACEART INCLINIC DEVICE CHECK
Battery Voltage: 2.86 V
Implantable Lead Implant Date: 20140210
Implantable Lead Implant Date: 20140210
Implantable Lead Location: 753858
Implantable Pulse Generator Implant Date: 20140210
Lead Channel Impedance Value: 687.5 Ohm
Lead Channel Pacing Threshold Amplitude: 0.75 V
Lead Channel Pacing Threshold Amplitude: 1 V
Lead Channel Sensing Intrinsic Amplitude: 4.1 mV
Lead Channel Setting Pacing Amplitude: 2 V
Lead Channel Setting Pacing Pulse Width: 0.4 ms
Lead Channel Setting Pacing Pulse Width: 0.4 ms
MDC IDC LEAD IMPLANT DT: 20140210
MDC IDC LEAD LOCATION: 753859
MDC IDC LEAD LOCATION: 753860
MDC IDC MSMT LEADCHNL LV PACING THRESHOLD PULSEWIDTH: 0.4 ms
MDC IDC MSMT LEADCHNL RA IMPEDANCE VALUE: 350 Ohm
MDC IDC MSMT LEADCHNL RA PACING THRESHOLD AMPLITUDE: 0.75 V
MDC IDC MSMT LEADCHNL RA PACING THRESHOLD PULSEWIDTH: 0.4 ms
MDC IDC MSMT LEADCHNL RV IMPEDANCE VALUE: 587.5 Ohm
MDC IDC MSMT LEADCHNL RV PACING THRESHOLD PULSEWIDTH: 0.4 ms
MDC IDC SESS DTM: 20180309142007
MDC IDC SET LEADCHNL RA PACING AMPLITUDE: 2 V
MDC IDC SET LEADCHNL RV PACING AMPLITUDE: 2.5 V
MDC IDC SET LEADCHNL RV SENSING SENSITIVITY: 4 mV
MDC IDC STAT BRADY RA PERCENT PACED: 96 %
MDC IDC STAT BRADY RV PERCENT PACED: 99.96 %
Pulse Gen Model: 3210
Pulse Gen Serial Number: 2882222

## 2016-12-26 NOTE — Progress Notes (Signed)
f      Patient Care Team: Hillis Range, MD as PCP - General (Family Medicine)   HPI  [name] is a 83 y.o. female Seen in followup for CRT P. implantation for complete heart block in the setting of ischemic heart disease and ejection fraction of 40% this was done 2/14  She has done relatively well without significant SOB  she denies chest pain and peripheral edema.  She lives with her daughter. She lost a son to pneumonia about 15 years ago.  Cr 1.12   She has a history of ischemic heart disease with prior bypass surgery. She also has hypertension obstructive sleep apnea and chronic kidney disease. Past Medical History:  Diagnosis Date  . Acute kidney failure, unspecified   . Anemia   . Arthritis   . Celiac disease   . CHF (congestive heart failure) ([place])   . Chronic systolic CHF (congestive heart failure) ([place])   . Complete heart block ([place])   . Coronary artery disease   . Diabetes mellitus    INSULIN DEPENDENT  . GERD (gastroesophageal reflux disease)   . Glaucoma   . Headache(784.0)   . Heart disease   . Hypercholesterolemia   . Hypertension   . Iron deficiency anemia, unspecified   . Ischemic cardiomyopathy   . Shortness of breath   . Sleep apnea    uses cpap  . Stroke ([place])   . Unspecified constipation     Past Surgical History:  Procedure Laterality Date  . BACK SURGERY    . BI-VENTRICULAR PACEMAKER INSERTION (CRT-P)  March 2014   [facility]  . CARDIAC CATHETERIZATION  09/02/2012  . CARDIAC SURGERY    . CORONARY ANGIOPLASTY WITH STENT PLACEMENT  09/02/2012   RCA  . PERCUTANEOUS CORONARY STENT INTERVENTION (PCI-S) N/A 09/02/2012   Procedure: PERCUTANEOUS CORONARY STENT INTERVENTION (PCI-S);  Surgeon: Clent Demark, MD;  Location: [facility] CATH LAB;  Service: Cardiovascular;  Laterality: N/A;  . PERMANENT PACEMAKER INSERTION N/A 11/29/2012   Procedure: PERMANENT PACEMAKER INSERTION;  Surgeon: Deboraha Sprang, MD;  Location: [facility] CATH LAB;   Service: Cardiovascular;  Laterality: N/A;  . TEMPORARY PACEMAKER INSERTION N/A 11/28/2012   Procedure: TEMPORARY PACEMAKER INSERTION;  Surgeon: Clent Demark, MD;  Location: [facility] CATH LAB;  Service: Cardiovascular;  Laterality: N/A;    Current Outpatient Prescriptions  Medication Sig Dispense Refill  . acetaZOLAMIDE (DIAMOX) 250 MG tablet Take 250 mg by mouth 4 (four) times daily.  3  . aspirin EC 81 MG tablet Take 81 mg by mouth daily.    Marland Kitchen atorvastatin (LIPITOR) 80 MG tablet Take 80 mg by mouth daily.  5  . BAYER CONTOUR TEST test strip     . carvedilol (COREG) 3.125 MG tablet Take 3.125 mg by mouth 2 (two) times daily.  3  . carvedilol (COREG) 6.25 MG tablet Take 1 tablet (6.25 mg total) by mouth 2 (two) times daily. 180 tablet 3  . clopidogrel (PLAVIX) 75 MG tablet Take 75 mg by mouth daily.    . COMBIGAN 0.2-0.5 % ophthalmic solution Place 1 drop into both eyes every 12 (twelve) hours.     . docusate sodium 100 MG CAPS Take 100 mg by mouth 2 (two) times daily. 60 capsule 0  . donepezil (ARICEPT) 5 MG tablet Take 1 tablet at bedtime.  Need to schedule appt for continued refills. 90 tablet 0  . dorzolamide (TRUSOPT) 2 % ophthalmic solution Place 1 drop into both eyes 2 (two)  times daily.     . ferrous sulfate 325 (65 FE) MG tablet Take 325 mg by mouth 2 (two) times daily.     Marland Kitchen gabapentin (NEURONTIN) 100 MG capsule Take 1 capsule (100 mg total) by mouth 2 (two) times daily. 180 capsule 3  . HUMALOG KWIKPEN 100 UNIT/ML KiwkPen TAKE 10 UNITS BY SUBCUTANEOUS ROUTE 3 TIMES EVERY DAY WITH FOOD  3  . insulin detemir (LEVEMIR) 100 UNIT/ML injection Inject 0.3 mLs (30 Units total) into the skin every morning. Provided by Dr Laurance Flatten from endocrinology (Patient taking differently: Inject 31 Units into the skin at bedtime. Provided by Dr Laurance Flatten from endocrinology) 10 mL 0  . latanoprost (XALATAN) 0.005 % ophthalmic solution Appointment OVERDUE, place 1 drop into both eyes daily at bedtime 2.5 mL 0  .  lubiprostone (AMITIZA) 24 MCG capsule Take 1 capsule (24 mcg total) by mouth 2 (two) times daily with a meal. 90 capsule 3  . Multiple Vitamin (MULTIVITAMIN WITH MINERALS) TABS Take 1 tablet by mouth daily.    . nitroGLYCERIN (NITROSTAT) 0.4 MG SL tablet Place 0.4 mg under the tongue every 5 (five) minutes as needed. Chest pains    . NOVOFINE 32G X 6 MM MISC     . ofloxacin (OCUFLOX) 0.3 % ophthalmic solution     . pantoprazole (PROTONIX) 40 MG tablet Take 40 mg by mouth daily.     . pilocarpine (PILOCAR) 4 % ophthalmic solution Place 1 drop into both eyes 2 (two) times daily.     . polyethylene glycol powder (GLYCOLAX/MIRALAX) powder TAKE 17 G BY MOUTH DAILY. HOLD FOR LOOSE STOOL 3162 g 1  . polyethylene glycol powder (GLYCOLAX/MIRALAX) powder APPOINTMENT OVERDUE, take 17G by mouth daily. Hold for loose stool 250 g 0  . prednisoLONE acetate (PRED FORTE) 1 % ophthalmic suspension     . valsartan-hydrochlorothiazide (DIOVAN-HCT) 320-25 MG per tablet Take by mouth daily. One half tablet daily     No current facility-administered medications for this visit.     No Known Allergies  Review of Systems negative except from HPI and PMH  Physical Exam BP 122/64   Pulse 76   Ht 5' 4"  (1.626 m)   Wt 169 lb (76.7 kg)   SpO2 97%   BMI 29.01 kg/m  Well developed and nourished in no acute distress HENT normal Neck supple with JVP-flat Clear Device pocket well healed; without hematoma or erythema.  There is no tethering Regular rate and rhythm, 2/6  murmurs   Abd-soft with active BS No Clubbing cyanosis edema Skin-warm and dry A & Oriented  Grossly normal sensory and motor function  ECG AV pacing With Neg QRS 1 and qR in V1  Assessment and  Plan  ICM  S/p CRT-P  [employer] The patient's device was interrogated.  The information was reviewed. No changes were made in the programming.    CHB Stable post pacing   CHF chronic mixed  HTN BP reasonable  Without symptoms of  ischemia  Euvolemic continue current meds  Blood work drawing yday

## 2016-12-26 NOTE — Patient Instructions (Signed)
Medication Instructions: Your physician recommends that you continue on your current medications as directed. Please refer to the Current Medication list given to you today.   Labwork: None Ordered  Procedures/Testing: None Ordered  Follow-Up: Remote monitoring is used to monitor your Pacemaker from home. This monitoring reduces the number of office visits required to check your device to one time per year. It allows [place] to keep an eye on the functioning of your device to ensure it is working properly. You are scheduled for a device check from home on 03/30/17 You may send your transmission at any time that day. If you have a wireless device, the transmission will be sent automatically. After your physician reviews your transmission, you will receive a postcard with your next transmission date.   Your physician wants you to follow-up in 1 year with Dr. Caryl Comes. You will receive a reminder letter in the mail two months in advance. If you don't receive a letter, please call our office to schedule the follow-up appointment.   Any Additional Special Instructions Will Be Listed Below (If Applicable).     If you need a refill on your cardiac medications before your next appointment, please call your pharmacy.

## 2016-12-29 ENCOUNTER — Ambulatory Visit: Payer: Medicare Other | Admitting: Podiatry

## 2017-01-06 ENCOUNTER — Ambulatory Visit: Payer: Medicare Other | Admitting: Podiatry

## 2017-01-13 ENCOUNTER — Ambulatory Visit (INDEPENDENT_AMBULATORY_CARE_PROVIDER_SITE_OTHER): Payer: Medicare Other | Admitting: Podiatry

## 2017-01-13 ENCOUNTER — Encounter: Payer: Self-pay | Admitting: Podiatry

## 2017-01-13 DIAGNOSIS — B351 Tinea unguium: Secondary | ICD-10-CM | POA: Diagnosis not present

## 2017-01-13 DIAGNOSIS — M79676 Pain in unspecified toe(s): Secondary | ICD-10-CM | POA: Diagnosis not present

## 2017-01-13 NOTE — Patient Instructions (Signed)
Seen for hypertrophic nails. All nails debrided. Return in 3 months or as needed.  

## 2017-01-13 NOTE — Progress Notes (Signed)
Subjective: 83year old female presents complaining of painful nails and request nails trimmed. Patient denies any new problems.  Objective: Dermatologic: No open skin lesions noted bilaterally The toenails are hypertrophic, elongated, brittle, deformed and tender to direct palpation x 10. Vascular: Pedal pulses are not palpable. No edema or erythema noted.  Neurologic: Subjective tingling sensation on toes at times. Protective sensory perception intact. Orthopedic: Hypermobile first ray bilateral. Forefoot varus with forefoot loading and rearfoot placed in neutral.  Assessment: Type II diabetic Painful mycotic nails x 10.  Plan: Debrided all nails 10. Return in 3 months for RFC.

## 2017-02-06 ENCOUNTER — Other Ambulatory Visit: Payer: Self-pay | Admitting: Internal Medicine

## 2017-03-22 ENCOUNTER — Other Ambulatory Visit: Payer: Self-pay | Admitting: Neurology

## 2017-03-30 ENCOUNTER — Ambulatory Visit (INDEPENDENT_AMBULATORY_CARE_PROVIDER_SITE_OTHER): Payer: Medicare Other | Admitting: *Deleted

## 2017-03-30 DIAGNOSIS — I255 Ischemic cardiomyopathy: Secondary | ICD-10-CM

## 2017-03-30 NOTE — Progress Notes (Signed)
Remote pacemaker transmission.   

## 2017-04-01 ENCOUNTER — Encounter: Payer: Self-pay | Admitting: Cardiology

## 2017-04-01 LAB — CUP PACEART REMOTE DEVICE CHECK
Battery Remaining Longevity: 35 mo
Battery Remaining Percentage: 40 %
Battery Voltage: 2.84 V
Brady Statistic AS VP Percent: 3.3 %
Implantable Lead Implant Date: 20140210
Implantable Lead Implant Date: 20140210
Implantable Lead Implant Date: 20140210
Implantable Lead Location: 753858
Implantable Pulse Generator Implant Date: 20140210
Lead Channel Impedance Value: 550 Ohm
Lead Channel Pacing Threshold Amplitude: 0.75 V
Lead Channel Pacing Threshold Pulse Width: 0.4 ms
Lead Channel Sensing Intrinsic Amplitude: 3.6 mV
Lead Channel Setting Pacing Amplitude: 2 V
Lead Channel Setting Pacing Amplitude: 2 V
Lead Channel Setting Pacing Amplitude: 2.5 V
Lead Channel Setting Pacing Pulse Width: 0.4 ms
MDC IDC LEAD LOCATION: 753859
MDC IDC LEAD LOCATION: 753860
MDC IDC MSMT LEADCHNL LV IMPEDANCE VALUE: 640 Ohm
MDC IDC MSMT LEADCHNL LV PACING THRESHOLD AMPLITUDE: 1 V
MDC IDC MSMT LEADCHNL LV PACING THRESHOLD PULSEWIDTH: 0.4 ms
MDC IDC MSMT LEADCHNL RA IMPEDANCE VALUE: 330 Ohm
MDC IDC MSMT LEADCHNL RV PACING THRESHOLD AMPLITUDE: 0.75 V
MDC IDC MSMT LEADCHNL RV PACING THRESHOLD PULSEWIDTH: 0.4 ms
MDC IDC MSMT LEADCHNL RV SENSING INTR AMPL: 12 mV
MDC IDC PG SERIAL: 2882222
MDC IDC SESS DTM: 20180611104959
MDC IDC SET LEADCHNL RV PACING PULSEWIDTH: 0.4 ms
MDC IDC SET LEADCHNL RV SENSING SENSITIVITY: 4 mV
MDC IDC STAT BRADY AP VP PERCENT: 97 %
MDC IDC STAT BRADY AP VS PERCENT: 1 %
MDC IDC STAT BRADY AS VS PERCENT: 1 %
MDC IDC STAT BRADY RA PERCENT PACED: 97 %

## 2017-04-09 ENCOUNTER — Other Ambulatory Visit: Payer: Self-pay | Admitting: Neurology

## 2017-04-15 ENCOUNTER — Ambulatory Visit (INDEPENDENT_AMBULATORY_CARE_PROVIDER_SITE_OTHER): Payer: Medicare Other | Admitting: Podiatry

## 2017-04-15 ENCOUNTER — Encounter: Payer: Self-pay | Admitting: Podiatry

## 2017-04-15 DIAGNOSIS — M79676 Pain in unspecified toe(s): Secondary | ICD-10-CM

## 2017-04-15 DIAGNOSIS — B351 Tinea unguium: Secondary | ICD-10-CM | POA: Diagnosis not present

## 2017-04-15 NOTE — Progress Notes (Signed)
Subjective: 84year old female presents complaining of painful nails and request nails trimmed.  Patient denies any new problems.  Objective: Dermatologic: No open skin lesions noted bilaterally The toenails are hypertrophic, elongated, brittle, deformed and tender to direct palpation x 10. Vascular: Pedal pulses are not palpable. No edema or erythema noted.  Neurologic: Subjective tingling sensation on toes at times. Protective sensory perception intact. Orthopedic: Hypermobile first ray bilateral. Forefoot varus with forefoot loading and rearfoot placed in neutral.  Assessment: Type II diabetic Painfulmycotic nails x 10.  Plan: Debrided all nails 10. Return in 3 months for RFC.

## 2017-04-15 NOTE — Patient Instructions (Signed)
Seen for hypertrophic nails. All nails debrided. Return in 3 months or as needed.  

## 2017-06-29 ENCOUNTER — Telehealth: Payer: Self-pay | Admitting: Cardiology

## 2017-06-29 ENCOUNTER — Ambulatory Visit (INDEPENDENT_AMBULATORY_CARE_PROVIDER_SITE_OTHER): Payer: Medicare Other | Admitting: *Deleted

## 2017-06-29 DIAGNOSIS — I442 Atrioventricular block, complete: Secondary | ICD-10-CM

## 2017-06-29 NOTE — Telephone Encounter (Signed)
LMOVM reminding pt to send remote transmission.   

## 2017-06-30 NOTE — Progress Notes (Signed)
Remote pacemaker transmission.   

## 2017-07-01 ENCOUNTER — Encounter: Payer: Self-pay | Admitting: Cardiology

## 2017-07-02 LAB — CUP PACEART REMOTE DEVICE CHECK
Date Time Interrogation Session: 20180913114604
Implantable Lead Implant Date: 20140210
Implantable Pulse Generator Implant Date: 20140210
Lead Channel Setting Pacing Amplitude: 2 V
Lead Channel Setting Pacing Amplitude: 2.5 V
Lead Channel Setting Pacing Pulse Width: 0.4 ms
MDC IDC LEAD IMPLANT DT: 20140210
MDC IDC LEAD IMPLANT DT: 20140210
MDC IDC LEAD LOCATION: 753858
MDC IDC LEAD LOCATION: 753859
MDC IDC LEAD LOCATION: 753860
MDC IDC PG SERIAL: 2882222
MDC IDC SET LEADCHNL LV PACING AMPLITUDE: 2 V
MDC IDC SET LEADCHNL LV PACING PULSEWIDTH: 0.4 ms
MDC IDC SET LEADCHNL RV SENSING SENSITIVITY: 4 mV
Pulse Gen Model: 3210

## 2017-07-16 ENCOUNTER — Ambulatory Visit: Payer: Medicare Other | Admitting: Podiatry

## 2017-07-28 ENCOUNTER — Ambulatory Visit (INDEPENDENT_AMBULATORY_CARE_PROVIDER_SITE_OTHER): Payer: Medicare Other | Admitting: Podiatry

## 2017-07-28 DIAGNOSIS — B351 Tinea unguium: Secondary | ICD-10-CM | POA: Diagnosis not present

## 2017-07-28 DIAGNOSIS — M79676 Pain in unspecified toe(s): Secondary | ICD-10-CM | POA: Diagnosis not present

## 2017-07-28 NOTE — Patient Instructions (Signed)
Seen for hypertrophic nails. All nails debrided. Return in 3 months or as needed.  

## 2017-07-29 ENCOUNTER — Encounter: Payer: Self-pay | Admitting: Podiatry

## 2017-07-29 NOTE — Progress Notes (Signed)
Subjective: 83 y.o. year old female patient presents complaining of painful nails. Patient requests toe nails trimmed. Denies any new problem.   Objective: Dermatologic: Thick yellow deformed nails x 10. No abnormal skin lesions noted. Vascular: Pedal pulses are not palpable. No edema or erythema noted. Orthopedic: Positive of hypermobile first ray, forefoot varus bilateral. Neurologic: Subjective tingling and numbness on both feet.   Assessment: Dystrophic mycotic nails x 10, symptomatic.  Treatment: All mycotic nails debrided.  Return in 3 months or as needed.

## 2017-08-14 ENCOUNTER — Other Ambulatory Visit: Payer: Self-pay | Admitting: Neurology

## 2017-09-28 ENCOUNTER — Ambulatory Visit (INDEPENDENT_AMBULATORY_CARE_PROVIDER_SITE_OTHER): Payer: Medicare Other | Admitting: *Deleted

## 2017-09-28 DIAGNOSIS — I442 Atrioventricular block, complete: Secondary | ICD-10-CM

## 2017-09-29 NOTE — Progress Notes (Signed)
Remote pacemaker transmission.   

## 2017-10-02 ENCOUNTER — Encounter: Payer: Self-pay | Admitting: Cardiology

## 2017-10-22 LAB — CUP PACEART REMOTE DEVICE CHECK
Battery Voltage: 2.81 V
Brady Statistic AP VP Percent: 96 %
Brady Statistic AP VS Percent: 1 %
Brady Statistic AS VP Percent: 3.8 %
Implantable Lead Implant Date: 20140210
Implantable Lead Implant Date: 20140210
Implantable Lead Location: 753859
Lead Channel Impedance Value: 340 Ohm
Lead Channel Impedance Value: 510 Ohm
Lead Channel Impedance Value: 650 Ohm
Lead Channel Pacing Threshold Amplitude: 0.75 V
Lead Channel Pacing Threshold Pulse Width: 0.4 ms
Lead Channel Sensing Intrinsic Amplitude: 12 mV
Lead Channel Setting Pacing Amplitude: 2 V
MDC IDC LEAD IMPLANT DT: 20140210
MDC IDC LEAD LOCATION: 753858
MDC IDC LEAD LOCATION: 753860
MDC IDC MSMT BATTERY REMAINING LONGEVITY: 25 mo
MDC IDC MSMT BATTERY REMAINING PERCENTAGE: 29 %
MDC IDC MSMT LEADCHNL LV PACING THRESHOLD AMPLITUDE: 1 V
MDC IDC MSMT LEADCHNL RA PACING THRESHOLD PULSEWIDTH: 0.4 ms
MDC IDC MSMT LEADCHNL RA SENSING INTR AMPL: 3.3 mV
MDC IDC MSMT LEADCHNL RV PACING THRESHOLD AMPLITUDE: 0.75 V
MDC IDC MSMT LEADCHNL RV PACING THRESHOLD PULSEWIDTH: 0.4 ms
MDC IDC PG IMPLANT DT: 20140210
MDC IDC PG SERIAL: 2882222
MDC IDC SESS DTM: 20181211015139
MDC IDC SET LEADCHNL LV PACING AMPLITUDE: 2 V
MDC IDC SET LEADCHNL LV PACING PULSEWIDTH: 0.4 ms
MDC IDC SET LEADCHNL RV PACING AMPLITUDE: 2.5 V
MDC IDC SET LEADCHNL RV PACING PULSEWIDTH: 0.4 ms
MDC IDC SET LEADCHNL RV SENSING SENSITIVITY: 4 mV
MDC IDC STAT BRADY AS VS PERCENT: 1 %
MDC IDC STAT BRADY RA PERCENT PACED: 96 %

## 2017-10-28 ENCOUNTER — Ambulatory Visit: Payer: Medicare Other | Admitting: Podiatry

## 2017-10-28 ENCOUNTER — Encounter: Payer: Self-pay | Admitting: Podiatry

## 2017-10-28 DIAGNOSIS — B351 Tinea unguium: Secondary | ICD-10-CM

## 2017-10-28 DIAGNOSIS — M79676 Pain in unspecified toe(s): Secondary | ICD-10-CM | POA: Diagnosis not present

## 2017-10-28 NOTE — Progress Notes (Signed)
Subjective: 84 y.o. year old female patient presents complaining of painful feet. Patient requests toe nails trimmed.  Her blood sugar this morning was at 72. She has an appointment to see her PCP next month.  Objective: Dermatologic: Thick yellow deformed nails x 10. Vascular: Pedal pulses are not palpable on DP right and faintly palpable on left. PT are not palpable bilateral. Orthopedic: No growth deformities. Neurologic: Subjective tingling and numbness both feet. Normal response to monofilament testing bilateral.  Assessment: Dystrophic mycotic nails x 10. Pain in both feet. Diabetic neuropathy.  Treatment: All mycotic nails debrided.  Return in 3 months or as needed.

## 2017-10-28 NOTE — Patient Instructions (Signed)
Seen for hypertrophic nails and painful feet. Noted of decreased blood flow on right foot. All nails debrided.  Need daily walking exercise as tolerated. Return in 3 months or sooner if needed.

## 2017-11-25 ENCOUNTER — Other Ambulatory Visit: Payer: Self-pay | Admitting: Neurology

## 2017-12-10 ENCOUNTER — Other Ambulatory Visit: Payer: Self-pay | Admitting: Neurology

## 2017-12-28 ENCOUNTER — Ambulatory Visit (INDEPENDENT_AMBULATORY_CARE_PROVIDER_SITE_OTHER): Payer: Medicare Other | Admitting: *Deleted

## 2017-12-28 ENCOUNTER — Telehealth: Payer: Self-pay | Admitting: Cardiology

## 2017-12-28 DIAGNOSIS — I442 Atrioventricular block, complete: Secondary | ICD-10-CM

## 2017-12-28 NOTE — Telephone Encounter (Signed)
Spoke with pt and reminded pt of remote transmission that is due today. Pt verbalized understanding.   

## 2017-12-31 ENCOUNTER — Encounter: Payer: Self-pay | Admitting: Cardiology

## 2018-01-05 ENCOUNTER — Encounter: Payer: Self-pay | Admitting: Cardiology

## 2018-01-05 NOTE — Progress Notes (Signed)
Remote pacemaker transmission.   

## 2018-01-16 LAB — CUP PACEART REMOTE DEVICE CHECK
Battery Remaining Percentage: 25 %
Battery Voltage: 2.8 V
Brady Statistic AP VS Percent: 1 %
Brady Statistic AS VP Percent: 4 %
Brady Statistic RA Percent Paced: 96 %
Implantable Lead Implant Date: 20140210
Implantable Lead Implant Date: 20140210
Implantable Lead Location: 753858
Implantable Lead Location: 753859
Implantable Pulse Generator Implant Date: 20140210
Lead Channel Impedance Value: 550 Ohm
Lead Channel Impedance Value: 640 Ohm
Lead Channel Pacing Threshold Amplitude: 0.75 V
Lead Channel Pacing Threshold Amplitude: 1 V
Lead Channel Pacing Threshold Pulse Width: 0.4 ms
Lead Channel Sensing Intrinsic Amplitude: 12 mV
Lead Channel Sensing Intrinsic Amplitude: 3.1 mV
Lead Channel Setting Pacing Amplitude: 2 V
Lead Channel Setting Pacing Amplitude: 2.5 V
Lead Channel Setting Pacing Pulse Width: 0.4 ms
Lead Channel Setting Sensing Sensitivity: 4 mV
MDC IDC LEAD IMPLANT DT: 20140210
MDC IDC LEAD LOCATION: 753860
MDC IDC MSMT BATTERY REMAINING LONGEVITY: 22 mo
MDC IDC MSMT LEADCHNL LV PACING THRESHOLD PULSEWIDTH: 0.4 ms
MDC IDC MSMT LEADCHNL RA IMPEDANCE VALUE: 340 Ohm
MDC IDC MSMT LEADCHNL RV PACING THRESHOLD AMPLITUDE: 0.75 V
MDC IDC MSMT LEADCHNL RV PACING THRESHOLD PULSEWIDTH: 0.4 ms
MDC IDC PG SERIAL: 2882222
MDC IDC SESS DTM: 20190311082144
MDC IDC SET LEADCHNL RA PACING AMPLITUDE: 2 V
MDC IDC SET LEADCHNL RV PACING PULSEWIDTH: 0.4 ms
MDC IDC STAT BRADY AP VP PERCENT: 96 %
MDC IDC STAT BRADY AS VS PERCENT: 1 %

## 2018-01-26 ENCOUNTER — Ambulatory Visit: Payer: Medicare Other | Admitting: Podiatry

## 2018-02-04 ENCOUNTER — Ambulatory Visit: Payer: Medicare Other | Admitting: Podiatry

## 2018-02-04 ENCOUNTER — Encounter: Payer: Self-pay | Admitting: Podiatry

## 2018-02-04 DIAGNOSIS — M79671 Pain in right foot: Secondary | ICD-10-CM

## 2018-02-04 DIAGNOSIS — B351 Tinea unguium: Secondary | ICD-10-CM

## 2018-02-04 DIAGNOSIS — M79672 Pain in left foot: Secondary | ICD-10-CM

## 2018-02-04 NOTE — Progress Notes (Signed)
Subjective: 84 y.o. year old female patient presents complaining of painful nails. Patient requests toe nails, corns and calluses trimmed.   Objective: Dermatologic: Thick yellow deformed nails x 10. Disfigured hallucal nail following previous nail removal. Vascular: Pedal pulses are not palpable DP and PT bilateral. Orthopedic: Contracted lesser digits bilateral. Neurologic: All epicritic and tactile sensations grossly intact.  Assessment: Dystrophic mycotic nails x 10. Painful feet.  Treatment: All mycotic nails debrided.  Return in 3 months or as needed.

## 2018-02-04 NOTE — Patient Instructions (Signed)
Seen for hypertrophic nails. All nails debrided. Return in 3 months or as needed.  

## 2018-03-11 ENCOUNTER — Encounter: Payer: Medicare Other | Admitting: Internal Medicine

## 2018-03-15 ENCOUNTER — Other Ambulatory Visit: Payer: Self-pay | Admitting: Neurology

## 2018-03-29 ENCOUNTER — Telehealth: Payer: Self-pay

## 2018-03-29 ENCOUNTER — Encounter: Payer: Medicare Other | Admitting: *Deleted

## 2018-03-29 NOTE — Telephone Encounter (Signed)
Spoke with pt and reminded pt of remote transmission that is due today. Pt verbalized understanding.   

## 2018-03-31 ENCOUNTER — Encounter: Payer: Self-pay | Admitting: Cardiology

## 2018-04-26 ENCOUNTER — Encounter: Payer: Self-pay | Admitting: Internal Medicine

## 2018-04-26 ENCOUNTER — Encounter (INDEPENDENT_AMBULATORY_CARE_PROVIDER_SITE_OTHER): Payer: Self-pay

## 2018-04-26 ENCOUNTER — Ambulatory Visit: Payer: Medicare Other | Admitting: Internal Medicine

## 2018-04-26 VITALS — BP 138/78 | HR 70 | Ht 64.0 in | Wt 173.2 lb

## 2018-04-26 DIAGNOSIS — I255 Ischemic cardiomyopathy: Secondary | ICD-10-CM | POA: Diagnosis not present

## 2018-04-26 DIAGNOSIS — Z950 Presence of cardiac pacemaker: Secondary | ICD-10-CM | POA: Diagnosis not present

## 2018-04-26 DIAGNOSIS — I442 Atrioventricular block, complete: Secondary | ICD-10-CM

## 2018-04-26 DIAGNOSIS — N179 Acute kidney failure, unspecified: Secondary | ICD-10-CM | POA: Insufficient documentation

## 2018-04-26 LAB — CUP PACEART INCLINIC DEVICE CHECK
Battery Remaining Longevity: 15 mo
Battery Voltage: 2.77 V
Brady Statistic RA Percent Paced: 96 %
Brady Statistic RV Percent Paced: 99.93 %
Implantable Lead Location: 753859
Implantable Lead Location: 753860
Lead Channel Impedance Value: 487.5 Ohm
Lead Channel Impedance Value: 637.5 Ohm
Lead Channel Pacing Threshold Amplitude: 0.75 V
Lead Channel Pacing Threshold Amplitude: 1 V
Lead Channel Pacing Threshold Amplitude: 1 V
Lead Channel Pacing Threshold Amplitude: 1 V
Lead Channel Pacing Threshold Pulse Width: 0.4 ms
Lead Channel Pacing Threshold Pulse Width: 0.4 ms
Lead Channel Pacing Threshold Pulse Width: 0.4 ms
Lead Channel Sensing Intrinsic Amplitude: 3 mV
Lead Channel Setting Pacing Amplitude: 2 V
Lead Channel Setting Pacing Amplitude: 2.5 V
Lead Channel Setting Sensing Sensitivity: 4 mV
MDC IDC LEAD IMPLANT DT: 20140210
MDC IDC LEAD IMPLANT DT: 20140210
MDC IDC LEAD IMPLANT DT: 20140210
MDC IDC LEAD LOCATION: 753858
MDC IDC MSMT LEADCHNL LV PACING THRESHOLD AMPLITUDE: 1 V
MDC IDC MSMT LEADCHNL LV PACING THRESHOLD PULSEWIDTH: 0.4 ms
MDC IDC MSMT LEADCHNL LV PACING THRESHOLD PULSEWIDTH: 0.4 ms
MDC IDC MSMT LEADCHNL RA IMPEDANCE VALUE: 325 Ohm
MDC IDC MSMT LEADCHNL RA PACING THRESHOLD AMPLITUDE: 0.75 V
MDC IDC MSMT LEADCHNL RA PACING THRESHOLD PULSEWIDTH: 0.4 ms
MDC IDC MSMT LEADCHNL RV SENSING INTR AMPL: 12 mV
MDC IDC PG IMPLANT DT: 20140210
MDC IDC PG SERIAL: 2882222
MDC IDC SESS DTM: 20190708161841
MDC IDC SET LEADCHNL LV PACING PULSEWIDTH: 0.4 ms
MDC IDC SET LEADCHNL RA PACING AMPLITUDE: 2 V
MDC IDC SET LEADCHNL RV PACING PULSEWIDTH: 0.4 ms

## 2018-04-26 NOTE — Progress Notes (Signed)
f      Patient Care Team: Hillis Range, MD as PCP - General (Family Medicine)   HPI  [name] is a 84 y.o. female Seen in followup for CRT P. implantation 2014 for complete heart block in the setting of ischemic heart disease and ejection fraction of 40%.  She is a history of prior bypass surgery and RCA stent   In with her great granddaughter  She lost a son to pneumonia about 15 years ago.  DATE TEST EF   2/14 Echo   40-45 %   11/18 Echo  50%      Date Cr K Hgb  10/16 1.56 3.5 10.2   3/18  1.0       She has a history of ischemic heart disease with prior bypass surgery. She also has hypertension obstructive sleep apnea and chronic kidney disease.  Exertional "fatigue"  Chest heaviness and SOB with < 10 steps  Saw [facility] about 2 weeks ago  Past Medical History:  Diagnosis Date  . Acute kidney failure, unspecified ([place])   . Anemia   . Arthritis   . Celiac disease   . CHF (congestive heart failure) ([place])   . Chronic systolic CHF (congestive heart failure) ([place])   . Complete heart block ([place])   . Coronary artery disease   . Diabetes mellitus    INSULIN DEPENDENT  . GERD (gastroesophageal reflux disease)   . Glaucoma   . Headache(784.0)   . Heart disease   . Hypercholesterolemia   . Hypertension   . Iron deficiency anemia, unspecified   . Ischemic cardiomyopathy   . Shortness of breath   . Sleep apnea    uses cpap  . Stroke ([place])   . Unspecified constipation     Past Surgical History:  Procedure Laterality Date  . BACK SURGERY    . BI-VENTRICULAR PACEMAKER INSERTION (CRT-P)  March 2014   [facility]  . CARDIAC CATHETERIZATION  09/02/2012  . CARDIAC SURGERY    . CORONARY ANGIOPLASTY WITH STENT PLACEMENT  09/02/2012   RCA  . PERCUTANEOUS CORONARY STENT INTERVENTION (PCI-S) N/A 09/02/2012   Procedure: PERCUTANEOUS CORONARY STENT INTERVENTION (PCI-S);  Surgeon: Clent Demark, MD;  Location: [facility] CATH LAB;  Service: Cardiovascular;   Laterality: N/A;  . PERMANENT PACEMAKER INSERTION N/A 11/29/2012   Procedure: PERMANENT PACEMAKER INSERTION;  Surgeon: Deboraha Sprang, MD;  Location: [facility] CATH LAB;  Service: Cardiovascular;  Laterality: N/A;  . TEMPORARY PACEMAKER INSERTION N/A 11/28/2012   Procedure: TEMPORARY PACEMAKER INSERTION;  Surgeon: Clent Demark, MD;  Location: [facility] CATH LAB;  Service: Cardiovascular;  Laterality: N/A;    Current Outpatient Medications  Medication Sig Dispense Refill  . aspirin EC 81 MG tablet Take 81 mg by mouth daily.    Marland Kitchen atorvastatin (LIPITOR) 80 MG tablet Take 80 mg by mouth daily.  5  . BAYER CONTOUR TEST test strip     . carvedilol (COREG) 3.125 MG tablet Take 3.125 mg by mouth 2 (two) times daily.  3  . carvedilol (COREG) 6.25 MG tablet Take 1 tablet (6.25 mg total) by mouth 2 (two) times daily. 180 tablet 3  . clopidogrel (PLAVIX) 75 MG tablet Take 75 mg by mouth daily.    . COMBIGAN 0.2-0.5 % ophthalmic solution Place 1 drop into both eyes every 12 (twelve) hours.     . docusate sodium 100 MG CAPS Take 100 mg by mouth 2 (two) times daily. 60 capsule 0  . donepezil (  ARICEPT) 5 MG tablet TAKE 1 TABLET AT BEDTIME. NEED TO SCHEDULE APPT FOR CONTINUED REFILLS. 30 tablet 0  . dorzolamide (TRUSOPT) 2 % ophthalmic solution Place 1 drop into both eyes 2 (two) times daily.     . ferrous sulfate 325 (65 FE) MG tablet Take 325 mg by mouth 2 (two) times daily.     Marland Kitchen gabapentin (NEURONTIN) 100 MG capsule TAKE 1 CAPSULE (100 MG TOTAL) BY MOUTH 2 (TWO) TIMES DAILY. 60 capsule 0  . HUMALOG KWIKPEN 100 UNIT/ML KiwkPen TAKE 10 UNITS BY SUBCUTANEOUS ROUTE 3 TIMES EVERY DAY WITH FOOD  3  . insulin detemir (LEVEMIR) 100 UNIT/ML injection Inject 0.3 mLs (30 Units total) into the skin every morning. Provided by Dr Laurance Flatten from endocrinology (Patient taking differently: Inject 31 Units into the skin at bedtime. Provided by Dr Laurance Flatten from endocrinology) 10 mL 0  . latanoprost (XALATAN) 0.005 % ophthalmic solution  Appointment OVERDUE, place 1 drop into both eyes daily at bedtime 2.5 mL 0  . lubiprostone (AMITIZA) 24 MCG capsule Take 1 capsule (24 mcg total) by mouth 2 (two) times daily with a meal. 90 capsule 3  . Multiple Vitamin (MULTIVITAMIN WITH MINERALS) TABS Take 1 tablet by mouth daily.    . nitroGLYCERIN (NITROSTAT) 0.4 MG SL tablet Place 0.4 mg under the tongue every 5 (five) minutes as needed. Chest pains    . NOVOFINE 32G X 6 MM MISC     . ofloxacin (OCUFLOX) 0.3 % ophthalmic solution     . pantoprazole (PROTONIX) 40 MG tablet Take 40 mg by mouth daily.     . pilocarpine (PILOCAR) 4 % ophthalmic solution Place 1 drop into both eyes 2 (two) times daily.     . polyethylene glycol powder (GLYCOLAX/MIRALAX) powder TAKE 17 G BY MOUTH DAILY. HOLD FOR LOOSE STOOL 3162 g 1  . polyethylene glycol powder (GLYCOLAX/MIRALAX) powder APPOINTMENT OVERDUE, take 17G by mouth daily. Hold for loose stool 250 g 0  . prednisoLONE acetate (PRED FORTE) 1 % ophthalmic suspension     . valsartan-hydrochlorothiazide (DIOVAN-HCT) 320-25 MG per tablet Take by mouth daily. One half tablet daily     No current facility-administered medications for this visit.     No Known Allergies  Review of Systems negative except from HPI and PMH  Physical Exam BP 138/78   Pulse 70   Ht 5' 4"  (1.626 m)   Wt 173 lb 3.2 oz (78.6 kg)   SpO2 95%   BMI 29.73 kg/m  Well developed and nourished in no acute distress HENT normal Neck supple with JVP-flat Clear Device pocket well healed; without hematoma or erythema.  There is no tethering  Regular rate and rhythm, no murmurs or gallops Abd-soft with active BS No Clubbing cyanosis tr edema Skin-warm and dry A & Oriented  Grossly normal sensory and motor function  Descending ECG  Sinus w P-synchronous/ AV  pacing Neg QRS 1 and qR V1  Assessment and  Plan  ICM  S/p CRT-P  [employer] The patient's device was interrogated.  The information was reviewed. No changes were made in  the programming.     CHB Stable post pacing   CHF chronic mixed  HTN   BP stable  Exertional chest discomfort is concerning.  I have discussed this with Dr. [facility]  Blood work results reviewed from his office.  Device is approaching ERI   We spent more than 50% of our >25 min visit in face to face counseling regarding the  above

## 2018-05-06 ENCOUNTER — Encounter: Payer: Self-pay | Admitting: Podiatry

## 2018-05-06 ENCOUNTER — Ambulatory Visit: Payer: Medicare Other | Admitting: Podiatry

## 2018-05-06 DIAGNOSIS — M79672 Pain in left foot: Secondary | ICD-10-CM

## 2018-05-06 DIAGNOSIS — M79671 Pain in right foot: Secondary | ICD-10-CM

## 2018-05-06 DIAGNOSIS — B351 Tinea unguium: Secondary | ICD-10-CM | POA: Diagnosis not present

## 2018-05-06 NOTE — Progress Notes (Signed)
Subjective: 84 y.o. year old female patient presents complaining of painful nails. Patient requests toe nails trimmed.   Objective: Dermatologic: Thick yellow deformed nails x 10. Vascular: Pedal pulses are not palpable. Mild lower limb edema right. Orthopedic: Contracted lesser digits bilateral. Neurologic: All epicritic and tactile sensations grossly intact.  Assessment: Dystrophic mycotic nails x 10. Painful feet.  Treatment: All mycotic nails debrided.  Return in 3 months or as needed.

## 2018-05-06 NOTE — Patient Instructions (Signed)
Seen for hypertrophic nails. All nails debrided. Return in 3 months or as needed.  

## 2018-05-11 ENCOUNTER — Other Ambulatory Visit: Payer: Self-pay | Admitting: Internal Medicine

## 2018-05-11 DIAGNOSIS — M810 Age-related osteoporosis without current pathological fracture: Secondary | ICD-10-CM

## 2018-05-11 DIAGNOSIS — Z1231 Encounter for screening mammogram for malignant neoplasm of breast: Secondary | ICD-10-CM

## 2018-05-20 ENCOUNTER — Ambulatory Visit: Payer: Medicare Other | Admitting: Neurology

## 2018-05-20 ENCOUNTER — Encounter: Payer: Self-pay | Admitting: Neurology

## 2018-05-20 VITALS — BP 199/79 | HR 74 | Ht 64.0 in | Wt 173.0 lb

## 2018-05-20 DIAGNOSIS — G4733 Obstructive sleep apnea (adult) (pediatric): Secondary | ICD-10-CM

## 2018-05-20 DIAGNOSIS — G4719 Other hypersomnia: Secondary | ICD-10-CM | POA: Diagnosis not present

## 2018-05-20 DIAGNOSIS — E663 Overweight: Secondary | ICD-10-CM | POA: Diagnosis not present

## 2018-05-20 DIAGNOSIS — Z68.41 Body mass index (BMI) pediatric, 85th percentile to less than 95th percentile for age: Secondary | ICD-10-CM

## 2018-05-20 DIAGNOSIS — R351 Nocturia: Secondary | ICD-10-CM

## 2018-05-20 NOTE — Patient Instructions (Signed)
Thank you for choosing [facility] for your sleep related care! It was nice to meet you today! I appreciate that you entrust me with your sleep related healthcare concerns. I hope, I was able to address at least some of your concerns today, and that I can help you feel reassured and also get better.    Here is what we discussed today and what we came up with as our plan for you:    Based on your symptoms and your exam I believe you are still at risk for obstructive sleep apnea and would benefit from reevaluation as it has been many years and you need new supplies and updated machine. Therefore, I think we should proceed with a sleep study to determine how severe your sleep apnea is. If you have more than mild OSA, I want you to consider ongoing treatment with CPAP. Please remember, the risks and ramifications of moderate to severe obstructive sleep apnea or OSA are: Cardiovascular disease, including congestive heart failure, stroke, difficult to control hypertension, arrhythmias, and even type 2 diabetes has been linked to untreated OSA. Sleep apnea causes disruption of sleep and sleep deprivation in most cases, which, in turn, can cause recurrent headaches, problems with memory, mood, concentration, focus, and vigilance. Most people with untreated sleep apnea report excessive daytime sleepiness, which can affect their ability to drive. Please do not drive if you feel sleepy.   I will likely see you back after your sleep study to go over the test results and where to go from there. We will call you after your sleep study to advise about the results (most likely, you will hear from [place], my nurse) and to set up an appointment at the time, as necessary.    Our sleep lab administrative assistant will call you to schedule your sleep study. If you don't hear back from her by about 2 weeks from now, please feel free to call her at [phone]. You can leave a message with your phone number  and concerns, if you get the voicemail box. She will call back as soon as possible.

## 2018-05-20 NOTE — Progress Notes (Signed)
Subjective:    Patient ID: [name] is a 84 y.o. female.  HPI     Star Age, MD, PhD [facility] [address] [place], [place] [address]  Dear Dr. Mellody Drown,   I saw your patient, [name], upon your kind request in my neurologic clinic today for initial consultation of her sleep disorder, in particular, for re-evaluation of her prior diagnosis of obstructive sleep apnea. The patient is accompanied by her GD and GGD today. As you know, [name] is an 84 year old right-handed woman with an underlying complex medical history of coronary artery disease with status post CABG, type 2 diabetes with suboptimal control, hypertension with left ventricular hypertrophy and diastolic dysfunction, hyperlipidemia, glaucoma, status post cataract surgeries, osteoporosis and osteopenia, iron deficiency, chronic kidney disease, peripheral artery disease, TIA, and overweight state, who was previously diagnosed with obstructive sleep apnea. She was placed on CPAP therapy but is no longer on it. I reviewed your office note from 02/08/2018, which you kindly included. She has seen Dr. Jannifer Franklin in our office in the past for memory difficulties. Her Epworth sleepiness score is 16 out of 24 today, fatigue score is 42 out of 63. Prior sleep study results are not available for my review, she does not have a working CPAP machine. She is a nonsmoker and does not utilize alcohol, does not drink caffeine on a regular basis. She is single and lives alone. She has 4 children. She is not completely sure about her medication list. Her daughter takes care of her medications. She would be willing to get tested with a sleep study and consider CPAP therapy again. Sleep study testing may have been 10 years, machine may be even older. She naps during the day. She has nocturia about 3/night. She denies AM HAs. She had a TE as a child. She goes to bed around 9-10 and rise time is  around 10. She does not drive.  Her Past Medical History Is Significant For: Past Medical History:  Diagnosis Date  . Anemia   . Arthritis   . Celiac disease   . Chronic systolic CHF (congestive heart failure) ([place])   . Complete heart block ([place])   . Coronary artery disease   . Diabetes mellitus    INSULIN DEPENDENT  . GERD (gastroesophageal reflux disease)   . Glaucoma   . Headache(784.0)   . Heart disease   . Hypercholesterolemia   . Hypertension   . Iron deficiency anemia, unspecified   . Ischemic cardiomyopathy   . Shortness of breath   . Sleep apnea    uses cpap  . Stroke ([place])   . Unspecified constipation     Her Past Surgical History Is Significant For: Past Surgical History:  Procedure Laterality Date  . BACK SURGERY    . BI-VENTRICULAR PACEMAKER INSERTION (CRT-P)  March 2014   [facility]  . CARDIAC CATHETERIZATION  09/02/2012  . CARDIAC SURGERY    . CORONARY ANGIOPLASTY WITH STENT PLACEMENT  09/02/2012   [facility]  . PERCUTANEOUS CORONARY STENT INTERVENTION (PCI-S) N/A 09/02/2012   Procedure: PERCUTANEOUS CORONARY STENT INTERVENTION (PCI-S);  Surgeon: Clent Demark, MD;  Location: [facility] CATH LAB;  Service: Cardiovascular;  Laterality: N/A;  . PERMANENT PACEMAKER INSERTION N/A 11/29/2012   Procedure: PERMANENT PACEMAKER INSERTION;  Surgeon: Deboraha Sprang, MD;  Location: [facility] CATH LAB;  Service: Cardiovascular;  Laterality: N/A;  . TEMPORARY PACEMAKER INSERTION N/A 11/28/2012   Procedure: TEMPORARY PACEMAKER INSERTION;  Surgeon:  Clent Demark, MD;  Location: [facility] CATH LAB;  Service: Cardiovascular;  Laterality: N/A;    Her Family History Is Significant For: Family History  Problem Relation Age of Onset  . Diabetes Mother   . Hypertension Mother   . Hyperlipidemia Mother   . Cancer Sister   . Dementia Sister   . Neuropathy Sister     Her Social History Is Significant For: Social History   Socioeconomic History  . Marital status: Widowed    Spouse name:  Not on file  . Number of children: 4  . Years of education: 2  . Highest education level: Not on file  Occupational History  . Occupation: Retired  Scientific laboratory technician  . Financial resource strain: Not on file  . Food insecurity:    Worry: Not on file    Inability: Not on file  . Transportation needs:    Medical: Not on file    Non-medical: Not on file  Tobacco Use  . Smoking status: Former Smoker    Last attempt to quit: 12/26/1976    Years since quitting: 41.4  . Smokeless tobacco: Never Used  Substance and Sexual Activity  . Alcohol use: No    Alcohol/week: 0.0 oz  . Drug use: No  . Sexual activity: Never  Lifestyle  . Physical activity:    Days per week: Not on file    Minutes per session: Not on file  . Stress: Not on file  Relationships  . Social connections:    Talks on phone: Not on file    Gets together: Not on file    Attends religious service: Not on file    Active member of club or organization: Not on file    Attends meetings of clubs or organizations: Not on file    Relationship status: Not on file  Other Topics Concern  . Not on file  Social History Narrative   Regular exercise-no   Caffeine Use-yes   Patient lives at home at home with [name] her daughter.   Retired   Right handed   Education 12th   Caffeine one cup of coffee daily    Her Allergies Are:  No Known Allergies:   Her Current Medications Are:  Outpatient Encounter Medications as of 05/20/2018  Medication Sig  . aspirin EC 81 MG tablet Take 81 mg by mouth daily.  Marland Kitchen atorvastatin (LIPITOR) 80 MG tablet Take 80 mg by mouth daily.  Marland Kitchen BAYER CONTOUR TEST test strip   . carvedilol (COREG) 3.125 MG tablet Take 3.125 mg by mouth 2 (two) times daily.  . carvedilol (COREG) 6.25 MG tablet Take 1 tablet (6.25 mg total) by mouth 2 (two) times daily.  . clopidogrel (PLAVIX) 75 MG tablet Take 75 mg by mouth daily.  . COMBIGAN 0.2-0.5 % ophthalmic solution Place 1 drop into both eyes every 12 (twelve)  hours.   . docusate sodium 100 MG CAPS Take 100 mg by mouth 2 (two) times daily.  Marland Kitchen donepezil (ARICEPT) 5 MG tablet TAKE 1 TABLET AT BEDTIME. NEED TO SCHEDULE APPT FOR CONTINUED REFILLS.  Marland Kitchen dorzolamide (TRUSOPT) 2 % ophthalmic solution Place 1 drop into both eyes 2 (two) times daily.   . ferrous sulfate 325 (65 FE) MG tablet Take 325 mg by mouth 2 (two) times daily.   Marland Kitchen gabapentin (NEURONTIN) 100 MG capsule TAKE 1 CAPSULE (100 MG TOTAL) BY MOUTH 2 (TWO) TIMES DAILY.  Marland Kitchen HUMALOG KWIKPEN 100 UNIT/ML KiwkPen TAKE 10 UNITS BY SUBCUTANEOUS ROUTE 3  TIMES EVERY DAY WITH FOOD  . insulin detemir (LEVEMIR) 100 UNIT/ML injection Inject 0.3 mLs (30 Units total) into the skin every morning. Provided by Dr Laurance Flatten from endocrinology (Patient taking differently: Inject 31 Units into the skin at bedtime. Provided by Dr Laurance Flatten from endocrinology)  . latanoprost (XALATAN) 0.005 % ophthalmic solution Appointment OVERDUE, place 1 drop into both eyes daily at bedtime  . lubiprostone (AMITIZA) 24 MCG capsule Take 1 capsule (24 mcg total) by mouth 2 (two) times daily with a meal.  . Multiple Vitamin (MULTIVITAMIN WITH MINERALS) TABS Take 1 tablet by mouth daily.  . nitroGLYCERIN (NITROSTAT) 0.4 MG SL tablet Place 0.4 mg under the tongue every 5 (five) minutes as needed. Chest pains  . NOVOFINE 32G X 6 MM MISC   . ofloxacin (OCUFLOX) 0.3 % ophthalmic solution   . pantoprazole (PROTONIX) 40 MG tablet Take 40 mg by mouth daily.   . pilocarpine (PILOCAR) 4 % ophthalmic solution Place 1 drop into both eyes 2 (two) times daily.   . polyethylene glycol powder (GLYCOLAX/MIRALAX) powder TAKE 17 G BY MOUTH DAILY. HOLD FOR LOOSE STOOL  . polyethylene glycol powder (GLYCOLAX/MIRALAX) powder APPOINTMENT OVERDUE, take 17G by mouth daily. Hold for loose stool  . prednisoLONE acetate (PRED FORTE) 1 % ophthalmic suspension   . valsartan-hydrochlorothiazide (DIOVAN-HCT) 320-25 MG per tablet Take by mouth daily. One half tablet daily   No  facility-administered encounter medications on file as of 05/20/2018.   :  Review of Systems:  Out of a complete 14 point review of systems, all are reviewed and negative with the exception of these symptoms as listed below:  Review of Systems  Neurological:       Pt presents today to discuss her sleep. Pt has had a sleep study in the past and was prescribed a cpap but she no longer has it. Pt does endorse snoring.  Epworth Sleepiness Scale 0= would never doze 1= slight chance of dozing 2= moderate chance of dozing 3= high chance of dozing  Sitting and reading: 3 Watching TV: 3 Sitting inactive in a public place (ex. Theater or meeting): 3 As a passenger in a car for an hour without a break: 0 Lying down to rest in the afternoon: 3 Sitting and talking to someone: 1 Sitting quietly after lunch (no alcohol): 3 In a car, while stopped in traffic: 0 Total: 16     Objective:  Neurological Exam  Physical Exam Physical Examination:   Vitals:   05/20/18 1110  BP: (!) 199/79  Pulse: 74    General Examination: The patient is a very pleasant 84 y.o. female in no acute distress. She appears well-developed and well-nourished and well groomed.   HEENT: Normocephalic, atraumatic, pupils are equal, round and reactive to light and accommodation. Hearing is impaired. Face is symmetric. Speech is not dysarthric. Neck circumference is 15-3/4 inches. Airway examination reveals adequate dental hygiene with multiple missing teeth, moderate to significant airway crowding secondary to wide uvula, redundant soft palate. Tonsils are absent. Tongue is wider, Mallampati is class III.   Chest: Clear to auscultation without wheezing, rhonchi or crackles noted.  Heart: S1+S2+0, regular and normal without murmurs, rubs or gallops noted.   Abdomen: Soft, non-tender and non-distended with normal bowel sounds appreciated on auscultation.  Extremities: There is 1+ pitting edema in the distal lower  extremities bilaterally. Distal legs are tender to touch.  Skin: Warm and dry without trophic changes noted.  Musculoskeletal: exam reveals no obvious joint deformities, tenderness  or joint swelling or erythema.   Neurologically:  Mental status: The patient is awake, alert and able to give part of her history, her memory function is impaired, her history is not very detailed, her family is not able to provide much in the way of details.  Mood is normal and affect is normal.  Cranial nerves II - XII are as described above under HEENT exam. In addition: shoulder shrug is normal with equal shoulder height noted. Motor exam: Normal bulk, strength and tone is noted. There is no drift, tremor or rebound. Romberg is not testable safely. Fine motor skills and coordination: grossly intact.  Cerebellar testing: No dysmetria or intention tremor.  Sensory exam: intact to light touch in the upper and lower extremities.  Gait, station and balance: She stands with some difficulty, gait is cautious and slow.   Assessment and Plan:  In summary, [name] is a very pleasant 84 y.o.-year old female with an underlying complex medical history of coronary artery disease with status post CABG, type 2 diabetes with suboptimal control, hypertension with left ventricular hypertrophy and diastolic dysfunction, hyperlipidemia, glaucoma, status post cataract surgeries, osteoporosis and osteopenia, iron deficiency, chronic kidney disease, peripheral artery disease, TIA, and overweight state, who presents for sleep evaluation with a prior diagnosis of OSA. She would be willing to get retested and consider CPAP therapy. She has not used CPAP in the years from what I understand. She is sleepy during the day.  I had a long chat with the patient and her granddaughter about my findings and the diagnosis of OSA, its prognosis and treatment options. She does not drive.  I recommended the following at this time: sleep study with  potential positive airway pressure titration. (We will score hypopneas at 4%).   I explained the sleep test procedure to the patient and also outlined possible surgical and non-surgical treatment options of OSA. She would be willing to try CPAP if the need arises. I explained the importance of being compliant with PAP treatment, not only for insurance purposes but primarily to improve Her symptoms, and for the patient's long term health benefit, including to reduce Her cardiovascular risks. I answered all their questions today and the patient and her family were in agreement. I would like to see her back after the sleep study is completed and encouraged her to call with any interim questions, concerns, problems or updates.   Thank you very much for allowing me to participate in the care of this nice patient. If I can be of any further assistance to you please do not hesitate to call me at [phone].  Sincerely,   Star Age, MD, PhD

## 2018-06-17 ENCOUNTER — Ambulatory Visit (INDEPENDENT_AMBULATORY_CARE_PROVIDER_SITE_OTHER): Payer: Medicare Other | Admitting: Neurology

## 2018-06-17 DIAGNOSIS — G4733 Obstructive sleep apnea (adult) (pediatric): Secondary | ICD-10-CM

## 2018-06-17 DIAGNOSIS — G4719 Other hypersomnia: Secondary | ICD-10-CM

## 2018-06-17 DIAGNOSIS — G4720 Circadian rhythm sleep disorder, unspecified type: Secondary | ICD-10-CM

## 2018-06-17 DIAGNOSIS — R351 Nocturia: Secondary | ICD-10-CM

## 2018-06-17 DIAGNOSIS — Z68.41 Body mass index (BMI) pediatric, 85th percentile to less than 95th percentile for age: Secondary | ICD-10-CM

## 2018-06-17 DIAGNOSIS — E663 Overweight: Secondary | ICD-10-CM

## 2018-06-22 NOTE — Addendum Note (Signed)
Addended by: Star Age on: 06/22/2018 08:33 AM   Modules accepted: Orders

## 2018-06-22 NOTE — Progress Notes (Signed)
Patient referred by Dr. Mellody Drown, seen by me on 05/20/18, split night sleep study on 06/17/18. Please call and notify patient that the recent sleep study confirmed the diagnosis of severe OSA. She did well with CPAP during the study with significant improvement of the respiratory events. Therefore, I would like start the patient on CPAP therapy at home by prescribing a machine for home use. I placed the order in the chart.  Please advise patient that we need a follow up appointment with either myself or one of our nurse practitioners in about 10 weeks post set-up to check for how the patient is feeling and how well the patient is using the machine, etc. Please go ahead and schedule the appointment, while you have the patient on the phone and make sure patient understands the importance of keeping this window for the FU appointment, as it is often an insurance requirement. Failing to adhere to this may result in losing coverage for sleep apnea treatment, at which point most patients are left with a choice of returning the machine or paying out of pocket (and we want neither of this to happen!).  Please re-enforce the importance of compliance with treatment and the need for [place] to monitor compliance data - again an insurance requirement and usually a good feedback for the patient as far as how they are doing.  Also remind patient, that any PAP machine or mask issues should be first addressed with the DME company, who provided the machine/mask.  Please ask if patient has a preference regarding DME company, may depend on the insurance too.  Please arrange for CPAP set up at home through a DME company of patient's choice.  Once you have spoken to the patient you can close the phone encounter. Please fax/route report to referring provider, thanks,   Star Age, MD, PhD [facility])

## 2018-06-22 NOTE — Procedures (Signed)
PATIENT'S NAME:  [name], [name] DOB:      [birth year]      MR#:    [id number]     DATE OF RECORDING: 06/17/2018 REFERRING M.D.:  [name], MD Study Performed:  Split-Night Titration Study HISTORY: 84 year old woman with a history of coronary artery disease with status post CABG, type 2 diabetes with suboptimal control, hypertension with left ventricular hypertrophy and diastolic dysfunction, hyperlipidemia, glaucoma, status post cataract surgeries, osteoporosis and osteopenia, iron deficiency, chronic kidney disease, peripheral artery disease, TIA, and overweight state, who was previously diagnosed with obstructive sleep apnea. She was placed on CPAP therapy but is no longer on it. The patient endorsed the Epworth Sleepiness Scale at 16 points. The patient's weight 173 pounds with a height of 64 (inches), resulting in a BMI of 29.4 kg/m2. The patient's neck circumference measured 16 inches.  CURRENT MEDICATIONS: Aspirin, Lipitor, Coreg, Plavix, Combigan, Docusate sodium, Aricept, Trusopt, ferrous sulfate, Nuerontin, Levemir, Xalatan, Amitiza, Multivitamins, Nitrostat, Novofine, Ocuflox, Protonix, Pilocar, Miralax, Pred Forte, Diovan-HCT.    PROCEDURE:  This is a multichannel digital polysomnogram utilizing the Somnostar 11.2 system.  Electrodes and sensors were applied and monitored per AASM Specifications.   EEG, EOG, Chin and Limb EMG, were sampled at 200 Hz.  ECG, Snore and Nasal Pressure, Thermal Airflow, Respiratory Effort, CPAP Flow and Pressure, Oximetry was sampled at 50 Hz. Digital video and audio were recorded.      BASELINE STUDY WITHOUT CPAP RESULTS:  Lights Out was at 20:56 and Lights On at 05:01 for the night, split study start at 23:48, epoch 354. Total recording time (TRT) was 171, with a total sleep time (TST) of 133 minutes.   The patient's sleep latency was 12.5 minutes.  REM sleep was absent. The sleep efficiency was 77.8%.    SLEEP ARCHITECTURE: WASO (Wake after sleep onset)  was 29 minutes with mild to moderate sleep fragmentation noted. Stage N1 was 18 minutes, Stage N2 was 115 minutes, Stage N3 was 0 minutes and Stage R (REM sleep) was 0 minutes.  The percentages were Stage N1 13.5%, Stage N2 86.5%, Stage N3 and Stage R (REM sleep) were absent. The arousals were noted as: 16 were spontaneous, 0 were associated with PLMs, 116 were associated with respiratory events.  RESPIRATORY ANALYSIS:  There were a total of 141 respiratory events:  81 obstructive apneas, 23 central apneas and 1 mixed apneas with a total of 105 apneas and an apnea index (AI) of 47.4. There were 36 hypopneas with a hypopnea index of 16.2. The patient also had 0 respiratory event related arousals (RERAs).  Snoring was noted.     The total APNEA/HYPOPNEA INDEX (AHI) was 63.6 /hour and the total RESPIRATORY DISTURBANCE INDEX was 63.6 /hour.  0 events occurred in REM sleep and 96 events in NREM. The REM AHI was 0, /hour versus a non-REM AHI of 63.6 /hour. The patient spent 402 minutes sleep time in the supine position 11 minutes in non-supine. The supine AHI was 63.6 /hour versus a non-supine AHI of 0.0 /hour.  OXYGEN SATURATION & C02:  The wake baseline 02 saturation was 96%, with the lowest being 83%. Time spent below 89% saturation equaled 19 minutes.  PERIODIC LIMB MOVEMENTS: The patient had a total of 0 Periodic Limb Movements.  The Periodic Limb Movement (PLM) index was 0 /hour and the PLM Arousal index was 0 /hour.  Audio and video analysis did not show any abnormal or unusual movements, behaviors, phonations or vocalizations. The patient took no  bathroom breaks. Mild to moderate snoring was noted. The EKG was in keeping with normal sinus rhythm (NSR) and PM artifact.   TITRATION STUDY WITH CPAP RESULTS:   The patient was fitted with a small Eson nasal mask. CPAP was initiated at 5 cmH20 with heated humidity per AASM split night standards and pressure was advanced to 7 cmH20 because of hypopneas,  apneas and desaturations.  At a PAP pressure of 7 cmH20, there was a reduction of the AHI to 2.8 /hour with supine REM sleep achieved and O2 nadir of 91%.   Total recording time (TRT) was 314.5 minutes, with a total sleep time (TST) of 280 minutes. The patient's sleep latency was 17 minutes. REM latency was 23 minutes.  The sleep efficiency was 89. %.    SLEEP ARCHITECTURE: Wake after sleep was 17.5 minutes with mild sleep fragmentation noted. Stage N1 11 minutes, Stage N2 241 minutes, Stage N3 4.5 minutes and Stage R (REM sleep) 23.5 minutes. The percentages were: Stage N1 3.9%, Stage N2 86.1%, Stage N3 1.6% and Stage R (REM sleep) 8.4%, which is reduced. The arousals were noted as: 12 were spontaneous, 0 were associated with PLMs, 3 were associated with respiratory events.  RESPIRATORY ANALYSIS:  There were a total of 7 respiratory events: 6 obstructive apneas, 0 central apneas and 0 mixed apneas with a total of 6 apneas and an apnea index (AI) of 1.3. There were 1 hypopneas with a hypopnea index of .2 /hour. The patient also had 0 respiratory event related arousals (RERAs).      The total APNEA/HYPOPNEA INDEX  (AHI) was 1.5 /hour and the total RESPIRATORY DISTURBANCE INDEX was 1.5 /hour.  0 events occurred in REM sleep and 7 events in NREM. The REM AHI was 0 /hour versus a non-REM AHI of 1.6 /hour. REM sleep was achieved on a pressure of  cm/h2o (AHI was  .) The patient spent 96% of total sleep time in the supine position. The supine AHI was 1.3 /hour, versus a non-supine AHI of 5.5/hour.  OXYGEN SATURATION & C02:  The wake baseline 02 saturation was 97%, with the lowest being 90%. Time spent below 89% saturation equaled 0 minutes.  PERIODIC LIMB MOVEMENTS: The patient had a total of 0 Periodic Limb Movements. The Periodic Limb Movement (PLM) index was 0 /hour and the PLM Arousal index was 0 /hour.  Post-study, the patient indicated that sleep was the same as usual.  POLYSOMNOGRAPHY IMPRESSION :    1. Obstructive Sleep Apnea (OSA)  2. Dysfunctions associated with sleep stages or arousals from sleep  RECOMMENDATIONS:  1. This patient has severe obstructive sleep apnea and responded well on CPAP therapy. I will recommend a home CPAP treatment pressure of 7 cm via small nasal mask with heated humidity. The patient should be reminded to be fully compliant with PAP therapy to improve sleep related symptoms and decrease long term cardiovascular risks. Please note that untreated obstructive sleep apnea may carry additional perioperative morbidity. Patients with significant obstructive sleep apnea should receive perioperative PAP therapy and the surgeons and particularly the anesthesiologist should be informed of the diagnosis and the severity of the sleep disordered breathing. 2. This study shows sleep fragmentation and abnormal sleep stage percentages; these are nonspecific findings and per se do not signify an intrinsic sleep disorder or a cause for the patient's sleep-related symptoms. Causes include (but are not limited to) the first night effect of the sleep study, circadian rhythm disturbances, medication effect or an underlying  mood disorder or medical problem.  3. The patient should be cautioned not to drive, work at heights, or operate dangerous or heavy equipment when tired or sleepy. Review and reiteration of good sleep hygiene measures should be pursued with any patient. 4. The patient will be seen in follow-up in the sleep clinic at [facility] for discussion of the test results, symptom and treatment compliance review, further management strategies, etc. The referring provider will be notified of the test results.  I certify that I have reviewed the entire raw data recording prior to the issuance of this report in accordance with the Standards of Accreditation of the American Academy of Sleep Medicine (AASM)   Star Age, MD, PhD Diplomat, American Board of Neurology and Sleep Medicine  (Neurology and Sleep Medicine)

## 2018-06-23 ENCOUNTER — Telehealth: Payer: Self-pay

## 2018-06-23 NOTE — Telephone Encounter (Signed)
I called pt to discuss her sleep study results. No answer, left a message asking her to call me back. 

## 2018-06-23 NOTE — Telephone Encounter (Signed)
-----   Message from Star Age, MD sent at 06/22/2018  8:33 AM EDT ----- Patient referred by Dr. Mellody Drown, seen by me on 05/20/18, split night sleep study on 06/17/18. Please call and notify patient that the recent sleep study confirmed the diagnosis of severe OSA. She did well with CPAP during the study with significant improvement of the respiratory events. Therefore, I would like start the patient on CPAP therapy at home by prescribing a machine for home use. I placed the order in the chart.  Please advise patient that we need a follow up appointment with either myself or one of our nurse practitioners in about 10 weeks post set-up to check for how the patient is feeling and how well the patient is using the machine, etc. Please go ahead and schedule the appointment, while you have the patient on the phone and make sure patient understands the importance of keeping this window for the FU appointment, as it is often an insurance requirement. Failing to adhere to this may result in losing coverage for sleep apnea treatment, at which point most patients are left with a choice of returning the machine or paying out of pocket (and we want neither of this to happen!).  Please re-enforce the importance of compliance with treatment and the need for [place] to monitor compliance data - again an insurance requirement and usually a good feedback for the patient as far as how they are doing.  Also remind patient, that any PAP machine or mask issues should be first addressed with the DME company, who provided the machine/mask.  Please ask if patient has a preference regarding DME company, may depend on the insurance too.  Please arrange for CPAP set up at home through a DME company of patient's choice.  Once you have spoken to the patient you can close the phone encounter. Please fax/route report to referring provider, thanks,   Star Age, MD, PhD [facility])

## 2018-06-24 NOTE — Telephone Encounter (Signed)
Beverly/DPR pt's daughter returned RN's call

## 2018-06-24 NOTE — Telephone Encounter (Signed)
I called pt's daughter [name], per DPR. I advised pt's daughter that Dr. Rexene Alberts reviewed their sleep study results and found that pt has severe osa but did well with the cpap during her latest sleep study. Dr. Rexene Alberts recommends that pt start a cpap at home. I reviewed PAP compliance expectations with the pt's daughter. Pt's daughter is agreeable to starting a CPAP. I advised pt's daughter that an order will be sent to a DME, [facility], and [facility] will call the pt within about one week after they file with the pt's insurance. [facility] will show the pt how to use the machine, fit for masks, and troubleshoot the CPAP if needed. A follow up appt was made for insurance purposes with Dr. Rexene Alberts on 09/29/2018 at 2:00pm. Pt's daughter verbalized understanding to arrive 15 minutes early and bring their CPAP. A letter with all of this information in it will be mailed to the pt as a reminder. I verified with the pt's daughter that the address we have on file is correct. Pt's daughter verbalized understanding of results. Pt's daughter had no questions at this time but was encouraged to call back if questions arise.

## 2018-06-25 ENCOUNTER — Other Ambulatory Visit: Payer: Medicare Other

## 2018-06-25 ENCOUNTER — Ambulatory Visit: Payer: Medicare Other

## 2018-06-29 NOTE — Telephone Encounter (Signed)
Pts daughter [name] called stating the pts old CPAP is broken and she does not have a new one. Unsure on how to go about getting CPAP fix

## 2018-06-29 NOTE — Telephone Encounter (Signed)
I called pt's daughter [name], per DPR. She has not received a call from [place]. I gave her [employer]'s number and asked her to reach out to them. Pt's daughter verbalized understanding.

## 2018-07-26 ENCOUNTER — Telehealth: Payer: Self-pay | Admitting: Cardiology

## 2018-07-26 ENCOUNTER — Ambulatory Visit (INDEPENDENT_AMBULATORY_CARE_PROVIDER_SITE_OTHER): Payer: Medicare Other | Admitting: *Deleted

## 2018-07-26 DIAGNOSIS — I442 Atrioventricular block, complete: Secondary | ICD-10-CM | POA: Diagnosis not present

## 2018-07-26 NOTE — Telephone Encounter (Signed)
Spoke with pt and reminded pt of remote transmission that is due today. Pt verbalized understanding.   

## 2018-07-27 NOTE — Progress Notes (Signed)
Remote pacemaker transmission.   

## 2018-08-04 ENCOUNTER — Encounter: Payer: Self-pay | Admitting: Cardiology

## 2018-08-04 NOTE — Progress Notes (Signed)
Letter  

## 2018-08-06 ENCOUNTER — Encounter: Payer: Self-pay | Admitting: Neurology

## 2018-08-10 ENCOUNTER — Ambulatory Visit: Payer: Medicare Other | Admitting: Podiatry

## 2018-08-24 ENCOUNTER — Ambulatory Visit: Payer: Medicare Other

## 2018-08-24 ENCOUNTER — Other Ambulatory Visit: Payer: Medicare Other

## 2018-09-10 LAB — CUP PACEART REMOTE DEVICE CHECK
Battery Remaining Longevity: 13 mo
Battery Voltage: 2.74 V
Brady Statistic AP VP Percent: 96 %
Brady Statistic AP VS Percent: 1 %
Brady Statistic AS VS Percent: 1 %
Brady Statistic RA Percent Paced: 96 %
Date Time Interrogation Session: 20191008133422
Implantable Lead Implant Date: 20140210
Implantable Lead Implant Date: 20140210
Implantable Lead Implant Date: 20140210
Implantable Lead Location: 753859
Implantable Lead Location: 753860
Implantable Pulse Generator Implant Date: 20140210
Lead Channel Impedance Value: 680 Ohm
Lead Channel Pacing Threshold Amplitude: 0.75 V
Lead Channel Pacing Threshold Amplitude: 1 V
Lead Channel Pacing Threshold Amplitude: 1 V
Lead Channel Pacing Threshold Pulse Width: 0.4 ms
Lead Channel Sensing Intrinsic Amplitude: 12 mV
Lead Channel Setting Pacing Amplitude: 2 V
Lead Channel Setting Pacing Pulse Width: 0.4 ms
Lead Channel Setting Sensing Sensitivity: 4 mV
MDC IDC LEAD LOCATION: 753858
MDC IDC MSMT BATTERY REMAINING PERCENTAGE: 15 %
MDC IDC MSMT LEADCHNL LV PACING THRESHOLD PULSEWIDTH: 0.4 ms
MDC IDC MSMT LEADCHNL RA IMPEDANCE VALUE: 350 Ohm
MDC IDC MSMT LEADCHNL RA SENSING INTR AMPL: 3.4 mV
MDC IDC MSMT LEADCHNL RV IMPEDANCE VALUE: 550 Ohm
MDC IDC MSMT LEADCHNL RV PACING THRESHOLD PULSEWIDTH: 0.4 ms
MDC IDC PG SERIAL: 2882222
MDC IDC SET LEADCHNL RA PACING AMPLITUDE: 2 V
MDC IDC SET LEADCHNL RV PACING AMPLITUDE: 2.5 V
MDC IDC SET LEADCHNL RV PACING PULSEWIDTH: 0.4 ms
MDC IDC STAT BRADY AS VP PERCENT: 4 %

## 2018-09-17 ENCOUNTER — Encounter: Payer: Self-pay | Admitting: Neurology

## 2018-09-29 ENCOUNTER — Ambulatory Visit: Payer: Medicare Other | Admitting: Neurology

## 2018-09-29 ENCOUNTER — Encounter: Payer: Self-pay | Admitting: Neurology

## 2018-09-29 VITALS — BP 152/77 | HR 81 | Ht 60.5 in | Wt 168.0 lb

## 2018-09-29 DIAGNOSIS — Z9989 Dependence on other enabling machines and devices: Secondary | ICD-10-CM | POA: Diagnosis not present

## 2018-09-29 DIAGNOSIS — G4733 Obstructive sleep apnea (adult) (pediatric): Secondary | ICD-10-CM

## 2018-09-29 NOTE — Progress Notes (Signed)
Subjective:    Patient ID: [name] is a 84 y.o. female.  HPI     Interim history:   [name] is an 84 year old right-handed woman with an underlying complex medical history of coronary artery disease with status post CABG, type 2 diabetes with suboptimal control, hypertension with left ventricular hypertrophy and diastolic dysfunction, hyperlipidemia, glaucoma, status post cataract surgeries, osteoporosis and osteopenia, iron deficiency, chronic kidney disease, peripheral artery disease, TIA, and overweight state, who presents for follow-up consultation of her obstructive sleep apnea after her recent sleep study testing starting CPAP therapy at home. The patient is accompanied by her daughter (and baby GGS) today. I first met her on 05/20/2018 at the request of her primary care physician, at which time she reported a prior diagnosis of sleep apnea and she had stopped using CPAP therapy. She was advised to proceed with a sleep study. She had a split-night sleep study on 06/17/2018. I went over her test results with her in detail today. Her baseline sleep efficiency was 77.8%, REM sleep and slow-wave sleep were absent, sleep latency 12.5 minutes. Total AHI was 63.6%, average oxygen saturation 96%, nadir was 83%. She had no significant PLMS. She was fitted with a small nasal mask and CPAP was titrated from 5 cm to 7 cm. On the final titration pressure of 7 cm her AHI was 2.8 per hour with supine REM sleep achieved an O2 nadir of 91%. She had no significant PLMS during the second part of the study. Based on her test results I prescribed CPAP therapy for home use of a pressure of 7 cm via nasal mask.  Today, 09/29/2018: I reviewed her CPAP compliance data from 08/19/2018 through 09/17/2018 which is a total of 30 days, during which time she used her machine 20 days with percent used days greater than 4 hours at 63%, indicating suboptimal compliance, residual AHI suboptimal at 11.1 per hour, leak  on the higher end with the 95th percentile at 20.5 L/m on a pressure of 7 cm with EPR of 3. She had 77% compliance in the month of 07/08/2018 through 08/06/2018 with an average usage of 8 hours and 20 minutes, residual AHI good at 4.5 per hour, but leak on the higher side. She has not used her machine since late November, indicates that she may have disconnected something around her mask, probably her head cure. She indicates that she is able to sleep a little better through the night, daughter reports that she indicated that her sleep quality when she first started using it. She is motivated to continue with treatment but currently is not sure if something is disconnected with her machine. She was not able to tolerate the nasal mask and currently uses a fullface mask. She may need new supplies. Has not changed the filter yet.  The patient's allergies, current medications, family history, past medical history, past social history, past surgical history and problem list were reviewed and updated as appropriate.   Previously:  05/20/2018: (She) was previously diagnosed with obstructive sleep apnea. She was placed on CPAP therapy but is no longer on it. I reviewed your office note from 02/08/2018, which you kindly included. She has seen Dr. Jannifer Franklin in our office in the past for memory difficulties. Her Epworth sleepiness score is 16 out of 24 today, fatigue score is 42 out of 63. Prior sleep study results are not available for my review, she does not have a working CPAP machine. She is a nonsmoker and does  not utilize alcohol, does not drink caffeine on a regular basis. She is single and lives alone. She has 4 children. She is not completely sure about her medication list. Her daughter takes care of her medications. She would be willing to get tested with a sleep study and consider CPAP therapy again. Sleep study testing may have been 10 years, machine may be even older. She naps during the day. She has nocturia  about 3/night. She denies AM HAs. She had a TE as a child. She goes to bed around 9-10 and rise time is around 10. She does not drive.  Her Past Medical History Is Significant For: Past Medical History:  Diagnosis Date  . Anemia   . Arthritis   . Celiac disease   . Chronic systolic CHF (congestive heart failure) ([place])   . Complete heart block ([place])   . Coronary artery disease   . Diabetes mellitus    INSULIN DEPENDENT  . GERD (gastroesophageal reflux disease)   . Glaucoma   . Headache(784.0)   . Heart disease   . Hypercholesterolemia   . Hypertension   . Iron deficiency anemia, unspecified   . Ischemic cardiomyopathy   . Shortness of breath   . Sleep apnea    uses cpap  . Stroke ([place])   . Unspecified constipation     Her Past Surgical History Is Significant For: Past Surgical History:  Procedure Laterality Date  . BACK SURGERY    . BI-VENTRICULAR PACEMAKER INSERTION (CRT-P)  March 2014   [facility]  . CARDIAC CATHETERIZATION  09/02/2012  . CARDIAC SURGERY    . CORONARY ANGIOPLASTY WITH STENT PLACEMENT  09/02/2012   [facility]  . PERCUTANEOUS CORONARY STENT INTERVENTION (PCI-S) N/A 09/02/2012   Procedure: PERCUTANEOUS CORONARY STENT INTERVENTION (PCI-S);  Surgeon: Clent Demark, MD;  Location: [facility] CATH LAB;  Service: Cardiovascular;  Laterality: N/A;  . PERMANENT PACEMAKER INSERTION N/A 11/29/2012   Procedure: PERMANENT PACEMAKER INSERTION;  Surgeon: Deboraha Sprang, MD;  Location: [facility] CATH LAB;  Service: Cardiovascular;  Laterality: N/A;  . TEMPORARY PACEMAKER INSERTION N/A 11/28/2012   Procedure: TEMPORARY PACEMAKER INSERTION;  Surgeon: Clent Demark, MD;  Location: [facility] CATH LAB;  Service: Cardiovascular;  Laterality: N/A;    Her Family History Is Significant For: Family History  Problem Relation Age of Onset  . Diabetes Mother   . Hypertension Mother   . Hyperlipidemia Mother   . Cancer Sister   . Dementia Sister   . Neuropathy Sister     Her Social History Is  Significant For: Social History   Socioeconomic History  . Marital status: Widowed    Spouse name: Not on file  . Number of children: 4  . Years of education: 57  . Highest education level: Not on file  Occupational History  . Occupation: Retired  Scientific laboratory technician  . Financial resource strain: Not on file  . Food insecurity:    Worry: Not on file    Inability: Not on file  . Transportation needs:    Medical: Not on file    Non-medical: Not on file  Tobacco Use  . Smoking status: Former Smoker    Last attempt to quit: 12/26/1976    Years since quitting: 41.7  . Smokeless tobacco: Never Used  Substance and Sexual Activity  . Alcohol use: No    Alcohol/week: 0.0 standard drinks  . Drug use: No  . Sexual activity: Never  Lifestyle  . Physical activity:    Days per  week: Not on file    Minutes per session: Not on file  . Stress: Not on file  Relationships  . Social connections:    Talks on phone: Not on file    Gets together: Not on file    Attends religious service: Not on file    Active member of club or organization: Not on file    Attends meetings of clubs or organizations: Not on file    Relationship status: Not on file  Other Topics Concern  . Not on file  Social History Narrative   Regular exercise-no   Caffeine Use-yes   Patient lives at home at home with [name] her daughter.   Retired   Right handed   Education 12th   Caffeine one cup of coffee daily    Her Allergies Are:  No Known Allergies:   Her Current Medications Are:  Outpatient Encounter Medications as of 09/29/2018  Medication Sig  . aspirin EC 81 MG tablet Take 81 mg by mouth daily.  Marland Kitchen atorvastatin (LIPITOR) 80 MG tablet Take 80 mg by mouth daily.  Marland Kitchen BAYER CONTOUR TEST test strip   . carvedilol (COREG) 3.125 MG tablet Take 3.125 mg by mouth 2 (two) times daily.  . carvedilol (COREG) 6.25 MG tablet Take 1 tablet (6.25 mg total) by mouth 2 (two) times daily.  . clopidogrel (PLAVIX) 75 MG tablet  Take 75 mg by mouth daily.  . COMBIGAN 0.2-0.5 % ophthalmic solution Place 1 drop into both eyes every 12 (twelve) hours.   . docusate sodium 100 MG CAPS Take 100 mg by mouth 2 (two) times daily.  Marland Kitchen donepezil (ARICEPT) 5 MG tablet TAKE 1 TABLET AT BEDTIME. NEED TO SCHEDULE APPT FOR CONTINUED REFILLS.  Marland Kitchen dorzolamide (TRUSOPT) 2 % ophthalmic solution Place 1 drop into both eyes 2 (two) times daily.   . ferrous sulfate 325 (65 FE) MG tablet Take 325 mg by mouth 2 (two) times daily.   Marland Kitchen HUMALOG KWIKPEN 100 UNIT/ML KiwkPen TAKE 10 UNITS BY SUBCUTANEOUS ROUTE 3 TIMES EVERY DAY WITH FOOD  . latanoprost (XALATAN) 0.005 % ophthalmic solution Appointment OVERDUE, place 1 drop into both eyes daily at bedtime  . lubiprostone (AMITIZA) 24 MCG capsule Take 1 capsule (24 mcg total) by mouth 2 (two) times daily with a meal.  . Multiple Vitamin (MULTIVITAMIN WITH MINERALS) TABS Take 1 tablet by mouth daily.  . nitroGLYCERIN (NITROSTAT) 0.4 MG SL tablet Place 0.4 mg under the tongue every 5 (five) minutes as needed. Chest pains  . NOVOFINE 32G X 6 MM MISC   . ofloxacin (OCUFLOX) 0.3 % ophthalmic solution   . pantoprazole (PROTONIX) 40 MG tablet Take 40 mg by mouth daily.   . pilocarpine (PILOCAR) 4 % ophthalmic solution Place 1 drop into both eyes 2 (two) times daily.   . polyethylene glycol powder (GLYCOLAX/MIRALAX) powder TAKE 17 G BY MOUTH DAILY. HOLD FOR LOOSE STOOL  . polyethylene glycol powder (GLYCOLAX/MIRALAX) powder APPOINTMENT OVERDUE, take 17G by mouth daily. Hold for loose stool  . prednisoLONE acetate (PRED FORTE) 1 % ophthalmic suspension   . valsartan-hydrochlorothiazide (DIOVAN-HCT) 320-25 MG per tablet Take 1 tablet by mouth daily.   . [DISCONTINUED] gabapentin (NEURONTIN) 100 MG capsule TAKE 1 CAPSULE (100 MG TOTAL) BY MOUTH 2 (TWO) TIMES DAILY.  . [DISCONTINUED] insulin detemir (LEVEMIR) 100 UNIT/ML injection Inject 0.3 mLs (30 Units total) into the skin every morning. Provided by Dr Laurance Flatten from  endocrinology (Patient taking differently: Inject 31 Units into the skin at  bedtime. Provided by Dr Laurance Flatten from endocrinology)   No facility-administered encounter medications on file as of 09/29/2018.   :  Review of Systems:  Out of a complete 14 point review of systems, all are reviewed and negative with the exception of these symptoms as listed below:   Review of Systems  Neurological:       Pt presents today to discuss her cpap. Pt reports that she can't really tell a difference in her sleep with the cpap.    Objective:  Neurological Exam  Physical Exam Physical Examination:   Vitals:   09/29/18 1429  BP: (!) 152/77  Pulse: 81    General Examination: The patient is a very pleasant 84 y.o. female in no acute distress. She appears well-developed and adequately groomed.   HEENT: Normocephalic, atraumatic, pupils are equal, round and reactive to light and accommodation. Hearing is impaired. Corrective eyeglasses in place. She is status post cataract repairs. Face is symmetric. Speech is not dysarthric. Neck circumference is 15-3/4 inches. Airway examination reveals adequate dental hygiene with multiple missing teeth, moderate airway crowding. Tongue protrudes centrally and palate elevates symmetrically.    Chest: Clear to auscultation without wheezing, rhonchi or crackles noted.  Heart: S1+S2+0, regular and normal without murmurs, rubs or gallops noted.   Abdomen: Soft, non-tender and non-distended with normal bowel sounds appreciated on auscultation.  Extremities: There is trace edema in the distal lower extremities bilaterally.   Skin: Warm and dry without trophic changes noted.  Musculoskeletal: exam reveals no obvious joint deformities, tenderness or joint swelling or erythema.   Neurologically:  Mental status: The patient is awake, alert and able to give part of her history, her memory function is impaired, she is not able to provide much in the way of details.   Mood is normal and affect is normal.  Cranial nerves II - XII are as described above under HEENT exam. In addition: shoulder shrug is normal with equal shoulder height noted. Motor exam: Normal bulk, strength and tone is noted. There is no tremor. Romberg is not testable safely. Fine motor skills and coordination: grossly intact.  Cerebellar testing: No dysmetria or intention tremor.  Sensory exam: intact to light touch in the upper and lower extremities.  Gait, station and balance: She stands with some difficulty, gait is cautious and slow.   Assessment and Plan:  In summary, SHERRISE LIBERTO is a very pleasant 84 year old female with an underlying complex medical history of coronary artery disease with status post CABG, type 2 diabetes with suboptimal control, hypertension with left ventricular hypertrophy and diastolic dysfunction, hyperlipidemia, glaucoma, status post cataract surgeries, osteoporosis and osteopenia, iron deficiency, chronic kidney disease, peripheral artery disease, TIA, and overweight state, who presents for follow-up consultation of her obstructive sleep apnea after sleep study testing. She had a split-night sleep study on 06/17/2018, which confirmed severe sleep disordered breathing. She has been on CPAP therapy and was fairly compliant for a while but has had lapses in treatment. She has fulfilled compliance criteria but insurance requirement and at one point was using CPAP nearly nightly with percent used days greater than 4 hours at 77% in the month of mid-May through mid October with adequate treatment on the current setting, residual AHI around 4.5 per hour. She had an average usage of up to 8 hours and 20 minutes. She is encouraged to get back on track with treatment. She has noticed some improvement in sleep quality at night and sleep consolidation. Her daughter will  make sure that she stays consistent with her treatment and will also check on the mask and headgear issue.  Patient lives alone but daughter lives next door. They are also advised to get in touch with the DME company regarding replacement supplies such as filter on the regular basis and to troubleshoot any mask or headgear issues. She is advised to follow-up routinely in 6 months, she can see one of our nurse practitioners. I answered all their questions today and she and her daughter were in agreement.

## 2018-09-29 NOTE — Patient Instructions (Signed)
Please continue using your CPAP regularly. While your insurance requires that you use CPAP at least 4 hours each night on 70% of the nights, I recommend, that you not skip any nights and use it throughout the night if you can. Getting used to CPAP and staying with the treatment long term does take time and patience and discipline. Untreated obstructive sleep apnea when it is moderate to severe can have an adverse impact on cardiovascular health and raise her risk for heart disease, arrhythmias, hypertension, congestive heart failure, stroke and diabetes. Untreated obstructive sleep apnea causes sleep disruption, nonrestorative sleep, and sleep deprivation. This can have an impact on your day to day functioning and cause daytime sleepiness and impairment of cognitive function, memory loss, mood disturbance, and problems focussing. Using CPAP regularly can improve these symptoms.  Please get back on track with your CPAP, as you have severe sleep apnea and should be on treatment with a CPAP.   Please get in touch with [facility], if you have issues with the mask or need new supplies.

## 2018-10-25 ENCOUNTER — Ambulatory Visit (INDEPENDENT_AMBULATORY_CARE_PROVIDER_SITE_OTHER): Payer: Medicare Other

## 2018-10-25 DIAGNOSIS — I442 Atrioventricular block, complete: Secondary | ICD-10-CM | POA: Diagnosis not present

## 2018-10-25 DIAGNOSIS — I255 Ischemic cardiomyopathy: Secondary | ICD-10-CM

## 2018-10-26 LAB — CUP PACEART REMOTE DEVICE CHECK
Battery Remaining Percentage: 10 %
Brady Statistic AP VP Percent: 95 %
Brady Statistic AP VS Percent: 1 %
Implantable Lead Implant Date: 20140210
Implantable Lead Location: 753858
Implantable Lead Location: 753859
Implantable Lead Location: 753860
Lead Channel Impedance Value: 640 Ohm
Lead Channel Pacing Threshold Pulse Width: 0.4 ms
Lead Channel Pacing Threshold Pulse Width: 0.4 ms
Lead Channel Pacing Threshold Pulse Width: 0.4 ms
Lead Channel Sensing Intrinsic Amplitude: 12 mV
Lead Channel Sensing Intrinsic Amplitude: 4.1 mV
Lead Channel Setting Pacing Amplitude: 2 V
Lead Channel Setting Pacing Amplitude: 2 V
Lead Channel Setting Pacing Amplitude: 2.5 V
Lead Channel Setting Pacing Pulse Width: 0.4 ms
Lead Channel Setting Sensing Sensitivity: 4 mV
MDC IDC LEAD IMPLANT DT: 20140210
MDC IDC LEAD IMPLANT DT: 20140210
MDC IDC MSMT BATTERY REMAINING LONGEVITY: 9 mo
MDC IDC MSMT BATTERY VOLTAGE: 2.71 V
MDC IDC MSMT LEADCHNL LV PACING THRESHOLD AMPLITUDE: 1 V
MDC IDC MSMT LEADCHNL RA IMPEDANCE VALUE: 340 Ohm
MDC IDC MSMT LEADCHNL RA PACING THRESHOLD AMPLITUDE: 0.75 V
MDC IDC MSMT LEADCHNL RV IMPEDANCE VALUE: 490 Ohm
MDC IDC MSMT LEADCHNL RV PACING THRESHOLD AMPLITUDE: 1 V
MDC IDC PG IMPLANT DT: 20140210
MDC IDC SESS DTM: 20200106140326
MDC IDC SET LEADCHNL RV PACING PULSEWIDTH: 0.4 ms
MDC IDC STAT BRADY AS VP PERCENT: 4.9 %
MDC IDC STAT BRADY AS VS PERCENT: 1 %
MDC IDC STAT BRADY RA PERCENT PACED: 95 %
Pulse Gen Model: 3210
Pulse Gen Serial Number: 2882222

## 2018-10-26 NOTE — Progress Notes (Signed)
Remote pacemaker transmission.   

## 2019-01-24 ENCOUNTER — Ambulatory Visit (INDEPENDENT_AMBULATORY_CARE_PROVIDER_SITE_OTHER): Payer: Medicare Other | Admitting: *Deleted

## 2019-01-24 ENCOUNTER — Telehealth: Payer: Self-pay

## 2019-01-24 ENCOUNTER — Other Ambulatory Visit: Payer: Self-pay

## 2019-01-24 DIAGNOSIS — I442 Atrioventricular block, complete: Secondary | ICD-10-CM

## 2019-01-24 LAB — CUP PACEART REMOTE DEVICE CHECK
Battery Remaining Longevity: 3 mo
Battery Remaining Percentage: 3 %
Battery Voltage: 2.65 V
Brady Statistic AP VP Percent: 94 %
Brady Statistic AP VS Percent: 1 %
Brady Statistic AS VP Percent: 5.7 %
Brady Statistic AS VS Percent: 1 %
Brady Statistic RA Percent Paced: 94 %
Date Time Interrogation Session: 20200406171643
Implantable Lead Implant Date: 20140210
Implantable Lead Implant Date: 20140210
Implantable Lead Implant Date: 20140210
Implantable Lead Location: 753858
Implantable Lead Location: 753859
Implantable Lead Location: 753860
Implantable Pulse Generator Implant Date: 20140210
Lead Channel Impedance Value: 350 Ohm
Lead Channel Impedance Value: 550 Ohm
Lead Channel Impedance Value: 650 Ohm
Lead Channel Pacing Threshold Amplitude: 0.75 V
Lead Channel Pacing Threshold Amplitude: 1 V
Lead Channel Pacing Threshold Amplitude: 1 V
Lead Channel Pacing Threshold Pulse Width: 0.4 ms
Lead Channel Pacing Threshold Pulse Width: 0.4 ms
Lead Channel Pacing Threshold Pulse Width: 0.4 ms
Lead Channel Sensing Intrinsic Amplitude: 12 mV
Lead Channel Sensing Intrinsic Amplitude: 3.3 mV
Lead Channel Setting Pacing Amplitude: 2 V
Lead Channel Setting Pacing Amplitude: 2 V
Lead Channel Setting Pacing Amplitude: 2.5 V
Lead Channel Setting Pacing Pulse Width: 0.4 ms
Lead Channel Setting Pacing Pulse Width: 0.4 ms
Lead Channel Setting Sensing Sensitivity: 4 mV
Pulse Gen Model: 3210
Pulse Gen Serial Number: 2882222

## 2019-01-24 NOTE — Telephone Encounter (Signed)
Spoke with patient to remind of missed remote transmission 

## 2019-02-01 ENCOUNTER — Telehealth: Payer: Self-pay | Admitting: *Deleted

## 2019-02-01 ENCOUNTER — Encounter: Payer: Self-pay | Admitting: Cardiology

## 2019-02-01 NOTE — Progress Notes (Signed)
Remote pacemaker transmission.   

## 2019-02-01 NOTE — Telephone Encounter (Signed)
Spoke with patient's daughter, [name]. She asked me to call pt's other daughter, Caren Griffins, as she is currently staying with the pt. LMOVM for Caren Griffins requesting call back to DC.  Per 01/24/19 remote transmission, patient's PPM is nearing ERI (estimated in ~3 months). Will advise of plan for monthly battery checks. Scheduled for automatic 31 day transmissions in [place].

## 2019-02-02 NOTE — Telephone Encounter (Signed)
LMOVM for Caren Griffins requesting call back to DC.

## 2019-02-04 NOTE — Telephone Encounter (Signed)
Left message to call device clinic back.

## 2019-02-10 NOTE — Telephone Encounter (Signed)
LMOVM for Caren Griffins.   Spoke with [employer]. Advised I have been unable to reach [place]. Advised of automatic monthly battery checks. Explained we will call when patient's device is at [facility]. Beverly verbalizes understanding and thanked me for my call.

## 2019-02-23 ENCOUNTER — Telehealth: Payer: Self-pay | Admitting: Cardiology

## 2019-02-23 ENCOUNTER — Ambulatory Visit (INDEPENDENT_AMBULATORY_CARE_PROVIDER_SITE_OTHER): Payer: Medicare Other | Admitting: *Deleted

## 2019-02-23 ENCOUNTER — Other Ambulatory Visit: Payer: Self-pay

## 2019-02-23 DIAGNOSIS — I442 Atrioventricular block, complete: Secondary | ICD-10-CM

## 2019-02-23 NOTE — Telephone Encounter (Signed)
Spoke w/ pt daughter and informed her that we will continue follow device monthly. Pt daughter verbalized understanding.

## 2019-03-01 ENCOUNTER — Encounter: Payer: Self-pay | Admitting: Cardiology

## 2019-03-01 NOTE — Progress Notes (Signed)
Remote pacemaker transmission.   

## 2019-03-30 ENCOUNTER — Ambulatory Visit: Payer: Medicare Other | Admitting: Podiatry

## 2019-03-31 ENCOUNTER — Telehealth: Payer: Self-pay | Admitting: Neurology

## 2019-03-31 NOTE — Telephone Encounter (Signed)
Pt daughter(on DPR) is asking for a call to discuss how to go about a complete workup for pt for an evaluation for dementia. Please call

## 2019-03-31 NOTE — Telephone Encounter (Signed)
I called pt's daughter, [name], per DPR. I advised her that Dr. Jannifer Franklin would need to see pt in the office to discuss possible memory work up again. It was discussed at pt's last visit with Dr. Jannifer Franklin as well. Pt was scheduled for 04/14/19 at 10:30. Pt is a high risk for COVID complications and I advised daughter of this, and she still wished to proceed. I explained our COVID policies and procedures. Pt's daughter verbalized understanding of these and of the appt date and time.

## 2019-04-01 ENCOUNTER — Telehealth: Payer: Self-pay

## 2019-04-01 ENCOUNTER — Encounter: Payer: Medicare Other | Admitting: *Deleted

## 2019-04-01 NOTE — Telephone Encounter (Signed)
Left message for patient to remind of missed remote transmission.  

## 2019-04-04 ENCOUNTER — Encounter: Payer: Self-pay | Admitting: Cardiology

## 2019-04-14 ENCOUNTER — Encounter: Payer: Self-pay | Admitting: Neurology

## 2019-04-14 ENCOUNTER — Other Ambulatory Visit: Payer: Self-pay

## 2019-04-14 ENCOUNTER — Ambulatory Visit (INDEPENDENT_AMBULATORY_CARE_PROVIDER_SITE_OTHER): Payer: Medicare Other | Admitting: Neurology

## 2019-04-14 DIAGNOSIS — R413 Other amnesia: Secondary | ICD-10-CM | POA: Diagnosis not present

## 2019-04-14 HISTORY — DX: Other amnesia: R41.3

## 2019-04-14 MED ORDER — DONEPEZIL HCL 5 MG PO TABS: ORAL_TABLET | ORAL | 1 refills | Status: DC

## 2019-04-14 NOTE — Progress Notes (Signed)
Reason for visit: Memory disturbance  [name] is an 85 y.o. female  History of present illness:  [name] is an 85 year old right-handed black female with a history of sleep apnea on CPAP, she has been seen here previously in August 2017 for subjective visual disturbances.  And October 2017, the family contacted our office indicating that she was having some troubles with memory and wished to go on a medication for memory.  Aricept was called in at that time.  It is not clear that the patient has continued this medication.  The patient comes to the office by herself.  She claims that she does not really believe she has significant issues with the memory.  She has not driven a car in greater than 20 years.  She lives with her son, he helps manage her medications and appointments, he will pay the bills, he does the cooking.  The patient claims that she sleeps fairly well at night, she has good energy level during the day.  She has a lot of arthritis in the back and in the knees and has difficulty walking because of this, she does not use a cane.  She reports no recent falls.  She returns for an evaluation.  Past Medical History:  Diagnosis Date  . Anemia   . Arthritis   . Celiac disease   . Chronic systolic CHF (congestive heart failure) ([place])   . Complete heart block ([place])   . Coronary artery disease   . Diabetes mellitus    INSULIN DEPENDENT  . GERD (gastroesophageal reflux disease)   . Glaucoma   . Headache(784.0)   . Heart disease   . Hypercholesterolemia   . Hypertension   . Iron deficiency anemia, unspecified   . Ischemic cardiomyopathy   . Memory difficulties 04/14/2019  . Shortness of breath   . Sleep apnea    uses cpap  . Stroke ([place])   . Unspecified constipation     Past Surgical History:  Procedure Laterality Date  . BACK SURGERY    . BI-VENTRICULAR PACEMAKER INSERTION (CRT-P)  March 2014   [facility]  . CARDIAC CATHETERIZATION  09/02/2012  .  CARDIAC SURGERY    . CORONARY ANGIOPLASTY WITH STENT PLACEMENT  09/02/2012   RCA  . PERCUTANEOUS CORONARY STENT INTERVENTION (PCI-S) N/A 09/02/2012   Procedure: PERCUTANEOUS CORONARY STENT INTERVENTION (PCI-S);  Surgeon: Clent Demark, MD;  Location: [facility] CATH LAB;  Service: Cardiovascular;  Laterality: N/A;  . PERMANENT PACEMAKER INSERTION N/A 11/29/2012   Procedure: PERMANENT PACEMAKER INSERTION;  Surgeon: Deboraha Sprang, MD;  Location: [facility] CATH LAB;  Service: Cardiovascular;  Laterality: N/A;  . TEMPORARY PACEMAKER INSERTION N/A 11/28/2012   Procedure: TEMPORARY PACEMAKER INSERTION;  Surgeon: Clent Demark, MD;  Location: [facility] CATH LAB;  Service: Cardiovascular;  Laterality: N/A;    Family History  Problem Relation Age of Onset  . Diabetes Mother   . Hypertension Mother   . Hyperlipidemia Mother   . Cancer Sister   . Dementia Sister   . Neuropathy Sister     Social history:  reports that she quit smoking about 42 years ago. She has never used smokeless tobacco. She reports that she does not drink alcohol or use drugs.   No Known Allergies  Medications:  Prior to Admission medications   Medication Sig Start Date End Date Taking? Authorizing Provider  aspirin EC 81 MG tablet Take 81 mg by mouth daily.   Yes [provider]  atorvastatin (LIPITOR) 80 MG tablet Take 80 mg by mouth daily. 04/17/15  Yes [provider]  BAYER CONTOUR TEST test strip  01/24/13  Yes [provider]  carvedilol (COREG) 3.125 MG tablet Take 3.125 mg by mouth 2 (two) times daily. 04/17/15  Yes [provider]  carvedilol (COREG) 6.25 MG tablet Take 1 tablet (6.25 mg total) by mouth 2 (two) times daily. 01/09/14  Yes Deboraha Sprang, MD  clopidogrel (PLAVIX) 75 MG tablet Take 75 mg by mouth daily.   Yes [provider]  COMBIGAN 0.2-0.5 % ophthalmic solution Place 1 drop into both eyes every 12 (twelve) hours.  04/03/12  Yes [provider]  docusate sodium 100 MG  CAPS Take 100 mg by mouth 2 (two) times daily. 10/22/12  Yes Annita Brod, MD  donepezil (ARICEPT) 5 MG tablet TAKE 1 TABLET AT BEDTIME. 04/14/19  Yes Kathrynn Ducking, MD  dorzolamide (TRUSOPT) 2 % ophthalmic solution Place 1 drop into both eyes 2 (two) times daily.  07/28/13  Yes [provider]  ferrous sulfate 325 (65 FE) MG tablet Take 325 mg by mouth 2 (two) times daily.    Yes [provider]  HUMALOG KWIKPEN 100 UNIT/ML KiwkPen TAKE 10 UNITS BY SUBCUTANEOUS ROUTE 3 TIMES EVERY DAY WITH FOOD 04/10/15  Yes [provider]  latanoprost (XALATAN) 0.005 % ophthalmic solution Appointment OVERDUE, place 1 drop into both eyes daily at bedtime 03/02/14  Yes Blanchie Serve, MD  lubiprostone (AMITIZA) 24 MCG capsule Take 1 capsule (24 mcg total) by mouth 2 (two) times daily with a meal. 06/08/13  Yes Blanchie Serve, MD  Multiple Vitamin (MULTIVITAMIN WITH MINERALS) TABS Take 1 tablet by mouth daily.   Yes [provider]  nitroGLYCERIN (NITROSTAT) 0.4 MG SL tablet Place 0.4 mg under the tongue every 5 (five) minutes as needed. Chest pains 09/03/12  Yes Charolette Forward, MD  NOVOFINE 32G X 6 MM MISC  01/25/13  Yes [provider]  ofloxacin (OCUFLOX) 0.3 % ophthalmic solution  05/01/16  Yes [provider]  pantoprazole (PROTONIX) 40 MG tablet Take 40 mg by mouth daily.  05/07/15  Yes [provider]  pilocarpine (PILOCAR) 4 % ophthalmic solution Place 1 drop into both eyes 2 (two) times daily.  05/01/15  Yes [provider]  polyethylene glycol powder (GLYCOLAX/MIRALAX) powder TAKE 17 G BY MOUTH DAILY. HOLD FOR LOOSE STOOL 06/01/14  Yes Pandey, Mahima, MD  polyethylene glycol powder (GLYCOLAX/MIRALAX) powder APPOINTMENT OVERDUE, take 17G by mouth daily. Hold for loose stool 07/04/15  Yes Lauree Chandler, NP  prednisoLONE acetate (PRED FORTE) 1 % ophthalmic suspension  05/01/16  Yes [provider]  valsartan-hydrochlorothiazide  (DIOVAN-HCT) 320-25 MG per tablet Take 1 tablet by mouth daily.    Yes [provider]    ROS:  Out of a complete 14 system review of symptoms, the patient complains only of the following symptoms, and all other reviewed systems are negative.  Memory problems Back pain, knee pain Walking problems  Blood pressure (!) 151/78, pulse 71, temperature (!) 97.3 F (36.3 C), temperature source Temporal, height 5' 5"  (1.651 m), weight 168 lb 5 oz (76.3 kg).  Physical Exam  General: The patient is alert and cooperative at the time of the examination.  Skin: No significant peripheral edema is noted.   Neurologic Exam  Mental status: The patient is alert and oriented x 3 at the time of the examination. The Mini-Mental status examination done today shows  a total score of 23/30.   Cranial nerves: Facial symmetry is present. Speech is normal, no aphasia or dysarthria is noted. Extraocular movements are full. Visual fields are full.  Motor: The patient has good strength in all 4 extremities.  Sensory examination: Soft touch sensation is symmetric on the face, arms, and legs.  Coordination: The patient has good finger-nose-finger and heel-to-shin bilaterally.  Gait and station: The patient has a stooped posture, she is able to walk by herself, gait appears to be slightly unsteady.  Tandem gait was not attempted.  Romberg is negative.  No drift is seen.  Reflexes: Deep tendon reflexes are symmetric.   Assessment/Plan:  1.  Memory disturbance  2.  Sleep apnea, on CPAP  The patient will be placed back on Aricept, she does not want to go back on medication for memory.  We will start 5 mg at night for 1 month, if she does well with this, she will be increased on the medication to take 10 mg at night.  She will follow-up here in 6 months.  Jill Alexanders MD 04/14/2019 10:57 AM  [facility] [address] [place], [place] [address]  Phone  [phone] Fax [phone]

## 2019-04-14 NOTE — Patient Instructions (Signed)
We will start Aricept (donepezil) for memory.  Begin Aricept (donepezil) at 5 mg at night for one month. If this medication is well-tolerated, please call our office and we will call in a prescription for the 10 mg tablets. Look out for side effects that may include nausea, diarrhea, weight loss, or stomach cramps. This medication will also cause a runny nose, therefore there is no need for allergy medications for this purpose.

## 2019-05-06 ENCOUNTER — Ambulatory Visit (INDEPENDENT_AMBULATORY_CARE_PROVIDER_SITE_OTHER): Payer: Medicare Other | Admitting: *Deleted

## 2019-05-06 DIAGNOSIS — Z950 Presence of cardiac pacemaker: Secondary | ICD-10-CM

## 2019-05-07 ENCOUNTER — Other Ambulatory Visit: Payer: Self-pay | Admitting: Neurology

## 2019-05-08 LAB — CUP PACEART REMOTE DEVICE CHECK
Date Time Interrogation Session: 20200719200847
Implantable Lead Implant Date: 20140210
Implantable Lead Implant Date: 20140210
Implantable Lead Implant Date: 20140210
Implantable Lead Location: 753858
Implantable Lead Location: 753859
Implantable Lead Location: 753860
Implantable Pulse Generator Implant Date: 20140210
Pulse Gen Model: 3210
Pulse Gen Serial Number: 2882222

## 2019-05-11 ENCOUNTER — Encounter: Payer: Self-pay | Admitting: Cardiology

## 2019-05-11 NOTE — Addendum Note (Signed)
Addended by: Tiajuana Amass on: 05/11/2019 06:55 PM   Modules accepted: Level of Service

## 2019-05-11 NOTE — Progress Notes (Signed)
Remote pacemaker transmission.   

## 2019-05-17 ENCOUNTER — Ambulatory Visit: Payer: Medicare Other | Admitting: Adult Health

## 2019-05-21 ENCOUNTER — Encounter: Payer: Self-pay | Admitting: Cardiology

## 2019-05-21 ENCOUNTER — Telehealth: Payer: Self-pay | Admitting: Cardiology

## 2019-05-21 NOTE — Telephone Encounter (Signed)
This encounter was created in error - please disregard.

## 2019-05-21 NOTE — Telephone Encounter (Signed)
Patient's daughter called stating that her moms remote transmission device for her pacemaker keeps going off.  Discussed with Dr. Rayann Heman. Felt that it is likely related to a power surge with the transmission box.  Instructed her to unplug the device and plug it back in.  She was instructed to call if it occurs again.  I will forward this note to device clinic to get a remote transmission on patient on Monday.

## 2019-05-23 ENCOUNTER — Telehealth: Payer: Self-pay

## 2019-05-23 NOTE — Telephone Encounter (Addendum)
Spoke with patient's daughter, [name] ([place]). She is currently at work but will try to have someone help her mother send the transmission. Offered to call pt at home, but [name] declines, states she will not understand what I'm asking her to do.

## 2019-05-23 NOTE — Telephone Encounter (Signed)
LMOVM (DPR) for [name] advising that transmission has not yet been received. Gave direct DC phone number if she needs assistance with transmission instructions.

## 2019-05-23 NOTE — Telephone Encounter (Signed)
Pt daughter states she will have some go do the transmission for the pt in about 10 minutes.  She states that the pt monitor keeps going off. I gave her the number to [facility] to get additional help if the monitor keep going off.

## 2019-05-23 NOTE — Telephone Encounter (Signed)
See phone note from 05/21/19.

## 2019-05-24 ENCOUNTER — Ambulatory Visit: Payer: Medicare Other | Admitting: Adult Health

## 2019-05-24 NOTE — Telephone Encounter (Signed)
Spoke w/ pt daughter and informed her that we have not received the remote transmission. She stated that when they try to send the transmission the monitor goes straight to the tower and that the 5 orange lights blink all at the same time. I instructed pt daughter and grand daughter to call tech support for help trouble shooting the monitor.

## 2019-05-25 NOTE — Telephone Encounter (Signed)
LMOVM for pt to return call 

## 2019-05-27 NOTE — Telephone Encounter (Signed)
LMOVM for pt to return call 

## 2019-06-03 NOTE — Telephone Encounter (Signed)
Spoke w/ pt daughter and she stated that she was supposed to call and order a new monitor and never did. I went ahead and ordered pt a new monitor and will send a cell adapter today. Pt daughter stated that she will send transmission once new equipment is received.

## 2019-06-13 ENCOUNTER — Ambulatory Visit (INDEPENDENT_AMBULATORY_CARE_PROVIDER_SITE_OTHER): Payer: Medicare Other | Admitting: *Deleted

## 2019-06-13 DIAGNOSIS — I459 Conduction disorder, unspecified: Secondary | ICD-10-CM

## 2019-06-14 ENCOUNTER — Telehealth: Payer: Self-pay

## 2019-06-14 LAB — CUP PACEART REMOTE DEVICE CHECK
Date Time Interrogation Session: 20200825192808
Implantable Lead Implant Date: 20140210
Implantable Lead Implant Date: 20140210
Implantable Lead Implant Date: 20140210
Implantable Lead Location: 753858
Implantable Lead Location: 753859
Implantable Lead Location: 753860
Implantable Pulse Generator Implant Date: 20140210
Pulse Gen Model: 3210
Pulse Gen Serial Number: 2882222

## 2019-06-14 NOTE — Telephone Encounter (Signed)
Spoke to pt daughter, [name], informed her that the pt has reached ERI. Dr. Aquilla Hacker scheduler will call to set up appt to discuss gen change.

## 2019-06-14 NOTE — Telephone Encounter (Signed)
Transmission received 06-14-2019

## 2019-06-21 NOTE — Addendum Note (Signed)
Addended by: Douglass Rivers D on: 06/21/2019 10:08 AM   Modules accepted: Level of Service

## 2019-06-21 NOTE — Progress Notes (Signed)
Remote pacemaker transmission.   

## 2019-06-21 NOTE — Progress Notes (Signed)
Cardiology Office Note Date:  06/22/2019  Patient ID:  [name] 30-Jan-1928, MRN [id number] PCP:  Hillis Range, MD  Cardiologist: Dr. Marland Kitchen  Electrophysiologist:  Dr. Caryl Comes    Chief Complaint: device has reached ERI  History of Present Illness: [name] is a 85 y.o. female with history of CHB w/CRT-P, CAD (CABG, RCA stent), ICM, chronic CHF (systolic), DM, HTN, HLD, OSA w/CPAP, stroke (pt states 10 years ago had a few minutes of difficult speech).  She comes in today to be seen for Dr. Caryl Comes.  Last seen by him July 2019, at that time having exertional chest discomfort, Dr. Caryl Comes d/w Dr. [facility] (?), no changes were made to her medicines or programming, noting she was nearing ERI.   More recently noted to have reached ERI by remote transmission.  She is accompanied by her son.  She is doing well.  Denies any CP or palpitations, no SOB at rest, will get a liitte winded with walking around, but this is unchanged for years, denies any trouble with her ADLs.  No dizzy spells, no near syncope or syncope.  Device information: SJM CRT-P, implanted 11/29/12  Past Medical History:  Diagnosis Date  . Anemia   . Arthritis   . Celiac disease   . Chronic systolic CHF (congestive heart failure) ([place])   . Complete heart block ([place])   . Coronary artery disease   . Diabetes mellitus    INSULIN DEPENDENT  . GERD (gastroesophageal reflux disease)   . Glaucoma   . Headache(784.0)   . Heart disease   . Hypercholesterolemia   . Hypertension   . Iron deficiency anemia, unspecified   . Ischemic cardiomyopathy   . Memory difficulties 04/14/2019  . Shortness of breath   . Sleep apnea    uses cpap  . Stroke ([place])   . Unspecified constipation     Past Surgical History:  Procedure Laterality Date  . BACK SURGERY    . BI-VENTRICULAR PACEMAKER INSERTION (CRT-P)  March 2014   [facility]  . CARDIAC CATHETERIZATION  09/02/2012  . CARDIAC SURGERY    . CORONARY ANGIOPLASTY  WITH STENT PLACEMENT  09/02/2012   RCA  . PERCUTANEOUS CORONARY STENT INTERVENTION (PCI-S) N/A 09/02/2012   Procedure: PERCUTANEOUS CORONARY STENT INTERVENTION (PCI-S);  Surgeon: Clent Demark, MD;  Location: [facility] CATH LAB;  Service: Cardiovascular;  Laterality: N/A;  . PERMANENT PACEMAKER INSERTION N/A 11/29/2012   Procedure: PERMANENT PACEMAKER INSERTION;  Surgeon: Deboraha Sprang, MD;  Location: [facility] CATH LAB;  Service: Cardiovascular;  Laterality: N/A;  . TEMPORARY PACEMAKER INSERTION N/A 11/28/2012   Procedure: TEMPORARY PACEMAKER INSERTION;  Surgeon: Clent Demark, MD;  Location: [facility] CATH LAB;  Service: Cardiovascular;  Laterality: N/A;    Current Outpatient Medications  Medication Sig Dispense Refill  . aspirin EC 81 MG tablet Take 81 mg by mouth daily.    Marland Kitchen atorvastatin (LIPITOR) 80 MG tablet Take 80 mg by mouth daily.  5  . BAYER CONTOUR TEST test strip     . carvedilol (COREG) 3.125 MG tablet Take 3.125 mg by mouth 2 (two) times daily.  3  . carvedilol (COREG) 6.25 MG tablet Take 1 tablet (6.25 mg total) by mouth 2 (two) times daily. 180 tablet 3  . clopidogrel (PLAVIX) 75 MG tablet Take 75 mg by mouth daily.    . COMBIGAN 0.2-0.5 % ophthalmic solution Place 1 drop into both eyes every 12 (twelve) hours.     . docusate  sodium 100 MG CAPS Take 100 mg by mouth 2 (two) times daily. 60 capsule 0  . donepezil (ARICEPT) 5 MG tablet TAKE 1 TABLET BY MOUTH EVERYDAY AT BEDTIME 30 tablet 5  . dorzolamide (TRUSOPT) 2 % ophthalmic solution Place 1 drop into both eyes 2 (two) times daily.     . ferrous sulfate 325 (65 FE) MG tablet Take 325 mg by mouth 2 (two) times daily.     Marland Kitchen HUMALOG KWIKPEN 100 UNIT/ML KiwkPen TAKE 10 UNITS BY SUBCUTANEOUS ROUTE 3 TIMES EVERY DAY WITH FOOD  3  . latanoprost (XALATAN) 0.005 % ophthalmic solution Appointment OVERDUE, place 1 drop into both eyes daily at bedtime 2.5 mL 0  . lubiprostone (AMITIZA) 24 MCG capsule Take 1 capsule (24 mcg total) by mouth 2 (two) times  daily with a meal. 90 capsule 3  . Multiple Vitamin (MULTIVITAMIN WITH MINERALS) TABS Take 1 tablet by mouth daily.    . nitroGLYCERIN (NITROSTAT) 0.4 MG SL tablet Place 0.4 mg under the tongue every 5 (five) minutes as needed. Chest pains    . NOVOFINE 32G X 6 MM MISC     . ofloxacin (OCUFLOX) 0.3 % ophthalmic solution     . pantoprazole (PROTONIX) 40 MG tablet Take 40 mg by mouth daily.     . pilocarpine (PILOCAR) 4 % ophthalmic solution Place 1 drop into both eyes 2 (two) times daily.     . polyethylene glycol powder (GLYCOLAX/MIRALAX) powder TAKE 17 G BY MOUTH DAILY. HOLD FOR LOOSE STOOL 3162 g 1  . prednisoLONE acetate (PRED FORTE) 1 % ophthalmic suspension     . valsartan-hydrochlorothiazide (DIOVAN-HCT) 320-25 MG per tablet Take 1 tablet by mouth daily.      No current facility-administered medications for this visit.     Allergies:   Patient has no known allergies.   Social History:  The patient  reports that she quit smoking about 42 years ago. She has never used smokeless tobacco. She reports that she does not drink alcohol or use drugs.   Family History:  The patient's family history includes Cancer in her sister; Dementia in her sister; Diabetes in her mother; Hyperlipidemia in her mother; Hypertension in her mother; Neuropathy in her sister.  ROS:  Please see the history of present illness.  All other systems are reviewed and otherwise negative.   PHYSICAL EXAM:  VS:  BP (!) 112/52   Pulse 63   Ht 5' 5"  (1.651 m)   Wt 177 lb (80.3 kg)   BMI 29.45 kg/m  BMI: Body mass index is 29.45 kg/m. Well nourished, well developed, in no acute distress  HEENT: normocephalic, atraumatic  Neck: no JVD, carotid bruits or masses Cardiac: RRR; no significant murmurs, no rubs, or gallops Lungs:  CTA b/l, no wheezing, rhonchi or rales  Abd: soft, nontender, obese MS: no deformity or atrophy, she has a large soft tissue mass (reported to be lipoma) R shoulder Ext: no edema  Skin: warm  and dry, no rash Neuro:  No gross deficits appreciated Psych: euthymic mood, full affect  PPM site is stable, no tethering or discomfort   EKG:  Done today and reviewed by myself shows AV paced PPM interrogation done today and reviewed by myself:  Battery reached ERI 05/11/2019, lead measurements are good AMS episodes noted Longest occurred 01/18/2019, this was true AFib 1 hour 21 minutes All others are <1 minute duration, and viewed EGMs available are 1:1, and what looks competitive atrial pacing in review with  [facility] representative   11/29/12: TTE Study Conclusions  - Left ventricle: The cavity size was normal. Wall thickness  was increased in a pattern of mild LVH. Systolic function  was mildly to moderately reduced. The estimated ejection  fraction was in the range of 40% to 45%. Possible moderate  hypokinesis of the basalinferior myocardium.  - Ventricular septum: Septal motion showed abnormal function  and dyssynergy.  - Tricuspid valve: Moderate regurgitation.  - Pulmonary arteries: Systolic pressure was mildly  increased.   Recent Labs: No results found for requested labs within last 8760 hours.  No results found for requested labs within last 8760 hours.   CrCl cannot be calculated (Patient's most recent lab result is older than the maximum 21 days allowed.).   Wt Readings from Last 3 Encounters:  06/22/19 177 lb (80.3 kg)  04/14/19 168 lb 5 oz (76.3 kg)  09/29/18 168 lb (76.2 kg)     Other studies reviewed: Additional studies/records reviewed today include: summarized above  ASSESSMENT AND PLAN:  1. CRT-P     reached ERI, 05/11/2019     dicussed generator change procedure, potential benefits and risks, she would like to proceed  She is pacer dependent, pacing at 30 today, will not delay her procedure Hold plavix 2 days prior  2. ICM 3. Chronic CHF (systolic)     LVEF 25-49% last echo in 2014     No symptoms or exam findings of fluid OL  4. CAD      No symptoms of angina     She reports a cardiologist, she cannot recall the name     No interventions in >1 year, several years according to the patient  5. HTN     No changes today, looks good  6. HLD     Deferred to her PMD, she reports monitors and manages  7. Paroxysmal Afib     Back in March, presumably has been seen prior to now     She has h/o stroke very remote 10 years ago, ?? Can we make the connection to AFib now     No a/c for now, gen change next week.  Inclined to monitor burden via her device further     I will send to Dr. Caryl Comes for his input   Disposition: F/u with routine post generator change follow up.  Current medicines are reviewed at length with the patient today.  The patient did not have any concerns regarding medicines.  Vicki Night, PA-C 06/22/2019 4:25 PM     [address] [place] [address] [phone] (office)  [phone] (fax)

## 2019-06-22 ENCOUNTER — Ambulatory Visit (INDEPENDENT_AMBULATORY_CARE_PROVIDER_SITE_OTHER): Payer: Medicare Other | Admitting: Physician Assistant

## 2019-06-22 ENCOUNTER — Encounter: Payer: Self-pay | Admitting: Physician Assistant

## 2019-06-22 ENCOUNTER — Other Ambulatory Visit: Payer: Self-pay

## 2019-06-22 ENCOUNTER — Encounter: Payer: Self-pay | Admitting: *Deleted

## 2019-06-22 VITALS — BP 112/52 | HR 63 | Ht 65.0 in | Wt 177.0 lb

## 2019-06-22 DIAGNOSIS — Z950 Presence of cardiac pacemaker: Secondary | ICD-10-CM

## 2019-06-22 DIAGNOSIS — I10 Essential (primary) hypertension: Secondary | ICD-10-CM

## 2019-06-22 DIAGNOSIS — I2510 Atherosclerotic heart disease of native coronary artery without angina pectoris: Secondary | ICD-10-CM | POA: Diagnosis not present

## 2019-06-22 DIAGNOSIS — I255 Ischemic cardiomyopathy: Secondary | ICD-10-CM

## 2019-06-22 DIAGNOSIS — I480 Paroxysmal atrial fibrillation: Secondary | ICD-10-CM

## 2019-06-22 DIAGNOSIS — I5022 Chronic systolic (congestive) heart failure: Secondary | ICD-10-CM | POA: Diagnosis not present

## 2019-06-22 NOTE — Patient Instructions (Addendum)
Medication Instructions:  Your physician recommends that you continue on your current medications as directed. Please refer to the Current Medication list given to you today.  If you need a refill on your cardiac medications before your next appointment, please call your pharmacy.   Lab work: BMET AND CBC TODAY   If you have labs (blood work) drawn today and your tests are completely normal, you will receive your results only by: Marland Kitchen MyChart Message (if you have MyChart) OR . A paper copy in the mail If you have any lab test that is abnormal or we need to change your treatment, we will call you to review the results.  Testing/Procedures:  SEE LETTER FOR GEN CHANGE OUT ON 06-29-19    Follow-Up: At [facility], you and your health needs are our priority.  As part of our continuing mission to provide you with exceptional heart care, we have created designated Provider Care Teams.  These Care Teams include your primary Cardiologist (physician) and Advanced Practice Providers (APPs -  Physician Assistants and Nurse Practitioners) who all work together to provide you with the care you need, when you need it. You will need a follow up appointment in 3 months.  After 06-29-19  You may see  Dr. Caryl Comes    [place] 06-29-19  WOUND CHECK  VISIT    Any Other Special Instructions Will Be Listed Below (If Applicable).

## 2019-06-24 ENCOUNTER — Telehealth: Payer: Self-pay

## 2019-06-24 ENCOUNTER — Other Ambulatory Visit (HOSPITAL_COMMUNITY)
Admission: RE | Admit: 2019-06-24 | Discharge: 2019-06-24 | Disposition: A | Discharge status: Home / Self Care | Payer: Medicare Other | Source: Ambulatory Visit | Attending: Internal Medicine | Admitting: Internal Medicine

## 2019-06-24 DIAGNOSIS — Z20828 Contact with and (suspected) exposure to other viral communicable diseases: Secondary | ICD-10-CM | POA: Insufficient documentation

## 2019-06-24 DIAGNOSIS — Z01812 Encounter for preprocedural laboratory examination: Secondary | ICD-10-CM | POA: Insufficient documentation

## 2019-06-24 NOTE — Telephone Encounter (Signed)
Pt has agreed to switch her procedure time to arrive at 6:15am on Sept 9. Dr. Lovena Le will be performing the procedure. Pt is aware and agrees. She has no additional questions.

## 2019-06-25 ENCOUNTER — Encounter: Payer: Self-pay | Admitting: Internal Medicine

## 2019-06-25 LAB — NOVEL CORONAVIRUS, NAA (HOSP ORDER, SEND-OUT TO REF LAB; TAT 18-24 HRS): SARS-CoV-2, NAA: NOT DETECTED

## 2019-06-25 NOTE — Progress Notes (Signed)
In error the worng smart text was signed Her device is at [facility] and she is scheduled for generator replacement this week

## 2019-06-29 ENCOUNTER — Other Ambulatory Visit: Payer: Self-pay

## 2019-06-29 ENCOUNTER — Ambulatory Visit (HOSPITAL_COMMUNITY)
Admission: AC | Admit: 2019-06-29 | Discharge: 2019-06-29 | Disposition: A | Discharge status: Home / Self Care | Payer: Medicare Other | Attending: Internal Medicine | Admitting: Internal Medicine

## 2019-06-29 ENCOUNTER — Encounter (HOSPITAL_COMMUNITY)
Admission: AC | Disposition: A | Discharge status: Home / Self Care | Payer: Self-pay | Source: Home / Self Care | Attending: Internal Medicine

## 2019-06-29 ENCOUNTER — Encounter (HOSPITAL_COMMUNITY): Payer: Self-pay | Admitting: Internal Medicine

## 2019-06-29 ENCOUNTER — Encounter (HOSPITAL_COMMUNITY): Admission: RE | Payer: Self-pay | Source: Home / Self Care

## 2019-06-29 ENCOUNTER — Ambulatory Visit (HOSPITAL_COMMUNITY): Admission: RE | Admit: 2019-06-29 | Payer: Medicare Other | Source: Home / Self Care | Admitting: Internal Medicine

## 2019-06-29 DIAGNOSIS — K219 Gastro-esophageal reflux disease without esophagitis: Secondary | ICD-10-CM | POA: Insufficient documentation

## 2019-06-29 DIAGNOSIS — Z7902 Long term (current) use of antithrombotics/antiplatelets: Secondary | ICD-10-CM | POA: Diagnosis not present

## 2019-06-29 DIAGNOSIS — M1990 Unspecified osteoarthritis, unspecified site: Secondary | ICD-10-CM | POA: Insufficient documentation

## 2019-06-29 DIAGNOSIS — E7800 Pure hypercholesterolemia, unspecified: Secondary | ICD-10-CM | POA: Diagnosis not present

## 2019-06-29 DIAGNOSIS — Z8249 Family history of ischemic heart disease and other diseases of the circulatory system: Secondary | ICD-10-CM | POA: Insufficient documentation

## 2019-06-29 DIAGNOSIS — Z8673 Personal history of transient ischemic attack (TIA), and cerebral infarction without residual deficits: Secondary | ICD-10-CM | POA: Diagnosis not present

## 2019-06-29 DIAGNOSIS — I442 Atrioventricular block, complete: Secondary | ICD-10-CM | POA: Diagnosis not present

## 2019-06-29 DIAGNOSIS — Z794 Long term (current) use of insulin: Secondary | ICD-10-CM | POA: Diagnosis not present

## 2019-06-29 DIAGNOSIS — Z950 Presence of cardiac pacemaker: Secondary | ICD-10-CM | POA: Diagnosis not present

## 2019-06-29 DIAGNOSIS — Z955 Presence of coronary angioplasty implant and graft: Secondary | ICD-10-CM | POA: Diagnosis not present

## 2019-06-29 DIAGNOSIS — K900 Celiac disease: Secondary | ICD-10-CM | POA: Insufficient documentation

## 2019-06-29 DIAGNOSIS — Z79899 Other long term (current) drug therapy: Secondary | ICD-10-CM | POA: Insufficient documentation

## 2019-06-29 DIAGNOSIS — Z87891 Personal history of nicotine dependence: Secondary | ICD-10-CM | POA: Diagnosis not present

## 2019-06-29 DIAGNOSIS — I5022 Chronic systolic (congestive) heart failure: Secondary | ICD-10-CM | POA: Diagnosis not present

## 2019-06-29 DIAGNOSIS — I480 Paroxysmal atrial fibrillation: Secondary | ICD-10-CM | POA: Insufficient documentation

## 2019-06-29 DIAGNOSIS — Z951 Presence of aortocoronary bypass graft: Secondary | ICD-10-CM | POA: Insufficient documentation

## 2019-06-29 DIAGNOSIS — I2510 Atherosclerotic heart disease of native coronary artery without angina pectoris: Secondary | ICD-10-CM | POA: Diagnosis not present

## 2019-06-29 DIAGNOSIS — I110 Hypertensive heart disease with heart failure: Secondary | ICD-10-CM | POA: Diagnosis not present

## 2019-06-29 DIAGNOSIS — E119 Type 2 diabetes mellitus without complications: Secondary | ICD-10-CM | POA: Diagnosis not present

## 2019-06-29 DIAGNOSIS — Z45010 Encounter for checking and testing of cardiac pacemaker pulse generator [battery]: Secondary | ICD-10-CM | POA: Diagnosis not present

## 2019-06-29 DIAGNOSIS — I459 Conduction disorder, unspecified: Secondary | ICD-10-CM | POA: Diagnosis present

## 2019-06-29 DIAGNOSIS — G4733 Obstructive sleep apnea (adult) (pediatric): Secondary | ICD-10-CM | POA: Insufficient documentation

## 2019-06-29 DIAGNOSIS — E785 Hyperlipidemia, unspecified: Secondary | ICD-10-CM | POA: Insufficient documentation

## 2019-06-29 DIAGNOSIS — I255 Ischemic cardiomyopathy: Secondary | ICD-10-CM | POA: Diagnosis not present

## 2019-06-29 DIAGNOSIS — Z7982 Long term (current) use of aspirin: Secondary | ICD-10-CM | POA: Diagnosis not present

## 2019-06-29 HISTORY — PX: BIV PACEMAKER GENERATOR CHANGEOUT: EP1198

## 2019-06-29 LAB — BASIC METABOLIC PANEL
Anion gap: 7 (ref 5–15)
BUN: 20 mg/dL (ref 8–23)
CO2: 27 mmol/L (ref 22–32)
Calcium: 11.5 mg/dL — ABNORMAL HIGH (ref 8.9–10.3)
Chloride: 104 mmol/L (ref 98–111)
Creatinine, Ser: 1.42 mg/dL — ABNORMAL HIGH (ref 0.44–1.00)
GFR calc Af Amer: 38 mL/min — ABNORMAL LOW (ref >60)
GFR calc non Af Amer: 33 mL/min — ABNORMAL LOW (ref >60)
Glucose, Bld: 114 mg/dL — ABNORMAL HIGH (ref 70–99)
Potassium: 4 mmol/L (ref 3.5–5.1)
Sodium: 138 mmol/L (ref 135–145)

## 2019-06-29 LAB — GLUCOSE, CAPILLARY: Glucose-Capillary: 102 mg/dL — ABNORMAL HIGH (ref 70–99)

## 2019-06-29 LAB — CBC
HCT: 35.7 % — ABNORMAL LOW (ref 36.0–46.0)
Hemoglobin: 11.4 g/dL — ABNORMAL LOW (ref 12.0–15.0)
MCH: 29.1 pg (ref 26.0–34.0)
MCHC: 31.9 g/dL (ref 30.0–36.0)
MCV: 91.1 fL (ref 80.0–100.0)
Platelets: 198 10*3/uL (ref 150–400)
RBC: 3.92 MIL/uL (ref 3.87–5.11)
RDW: 12.5 % (ref 11.5–15.5)
WBC: 7.6 10*3/uL (ref 4.0–10.5)
nRBC: 0 % (ref 0.0–0.2)

## 2019-06-29 LAB — SURGICAL PCR SCREEN
MRSA, PCR: NEGATIVE
Staphylococcus aureus: NEGATIVE

## 2019-06-29 SURGERY — PPM GENERATOR CHANGEOUT

## 2019-06-29 SURGERY — BIV PACEMAKER GENERATOR CHANGEOUT

## 2019-06-29 MED ORDER — CEFAZOLIN SODIUM-DEXTROSE 2-4 GM/100ML-% IV SOLN
2.0000 g | INTRAVENOUS | Status: AC
  Administered 2019-06-29: 2 g via INTRAVENOUS
  Filled 2019-06-29: qty 100

## 2019-06-29 MED ORDER — SODIUM CHLORIDE 0.9 % IV SOLN
80.0000 mg | INTRAVENOUS | Status: AC
  Administered 2019-06-29: 09:00:00 80 mg
  Filled 2019-06-29: qty 2

## 2019-06-29 MED ORDER — SODIUM CHLORIDE 0.9 % IV SOLN
INTRAVENOUS | Status: AC
  Filled 2019-06-29: qty 2

## 2019-06-29 MED ORDER — ONDANSETRON HCL 4 MG/2ML IJ SOLN: 4.0000 mg | Freq: Four times a day (QID) | INTRAMUSCULAR | Status: DC | PRN

## 2019-06-29 MED ORDER — LIDOCAINE HCL (PF) 1 % IJ SOLN
INTRAMUSCULAR | Status: AC
  Filled 2019-06-29: qty 30

## 2019-06-29 MED ORDER — ACETAMINOPHEN 325 MG PO TABS: 325.0000 mg | ORAL_TABLET | ORAL | Status: DC | PRN

## 2019-06-29 MED ORDER — MUPIROCIN 2 % EX OINT
1.0000 "application " | TOPICAL_OINTMENT | Freq: Once | CUTANEOUS | Status: AC
  Administered 2019-06-29: 1 via TOPICAL

## 2019-06-29 MED ORDER — LIDOCAINE HCL (PF) 1 % IJ SOLN
INTRAMUSCULAR | Status: DC | PRN
  Administered 2019-06-29: 45 mL

## 2019-06-29 MED ORDER — CHLORHEXIDINE GLUCONATE 4 % EX LIQD
60.0000 mL | Freq: Once | CUTANEOUS | Status: DC
  Filled 2019-06-29: qty 60

## 2019-06-29 MED ORDER — SODIUM CHLORIDE 0.9 % IV SOLN: INTRAVENOUS | Status: DC

## 2019-06-29 MED ORDER — LIDOCAINE HCL (PF) 1 % IJ SOLN
INTRAMUSCULAR | Status: AC
  Filled 2019-06-29: qty 60

## 2019-06-29 MED ORDER — CEFAZOLIN SODIUM-DEXTROSE 2-4 GM/100ML-% IV SOLN
INTRAVENOUS | Status: AC
  Filled 2019-06-29: qty 100

## 2019-06-29 SURGICAL SUPPLY — 5 items
CABLE SURGICAL S-101-97-12 (CABLE) ×3 IMPLANT
PACEMAKER ALLR CRT-P RF PM3222 (Pacemaker) IMPLANT
PAD PRO RADIOLUCENT 2001M-C (PAD) ×3 IMPLANT
PPM ALLURE CRT-P RF PM3222 (Pacemaker) ×3 IMPLANT
TRAY PACEMAKER INSERTION (PACKS) ×3 IMPLANT

## 2019-06-29 NOTE — Discharge Instructions (Signed)
Pacemaker Battery Change, Care After This sheet gives you information about how to care for yourself after your procedure. Your health care provider may also give you more specific instructions. If you have problems or questions, contact your health care provider. What can I expect after the procedure? After your procedure, it is common to have:  Pain or soreness at the site where the pacemaker was inserted.  Swelling at the site where the pacemaker was inserted. Follow these instructions at home: Incision care   Keep the incision clean and dry. ? Do not take baths, swim, or use a hot tub until your health care provider approves. ? You may shower the day after your procedure, or as directed by your health care provider. ? Pat the area dry with a clean towel. Do not rub the area. This may cause bleeding.  Follow instructions from your health care provider about how to take care of your incision. Make sure you: ? Wash your hands with soap and water before you change your bandage (dressing). If soap and water are not available, use hand sanitizer. ? Change your dressing as told by your health care provider. ? Leave stitches (sutures), skin glue, or adhesive strips in place. These skin closures may need to stay in place for 2 weeks or longer. If adhesive strip edges start to loosen and curl up, you may trim the loose edges. Do not remove adhesive strips completely unless your health care provider tells you to do that.  Check your incision area every day for signs of infection. Check for: ? More redness, swelling, or pain. ? More fluid or blood. ? Warmth. ? Pus or a bad smell. Activity  Do not lift anything that is heavier than 10 lb (4.5 kg) until your health care provider says it is okay to do so.  For the first 2 weeks, or as long as told by your health care provider: ? Avoid lifting your left arm higher than your shoulder. ? Be gentle when you move your arms over your head. It is okay  to raise your arm to comb your hair. ? Avoid strenuous exercise.  Ask your health care provider when it is okay to: ? Resume your normal activities. ? Return to work or school. ? Resume sexual activity. Eating and drinking  Eat a heart-healthy diet. This should include plenty of fresh fruits and vegetables, whole grains, low-fat dairy products, and lean protein like chicken and fish.  Limit alcohol intake to no more than 1 drink a day for non-pregnant women and 2 drinks a day for men. One drink equals 12 oz of beer, 5 oz of wine, or 1 oz of hard liquor.  Check ingredients and nutrition facts on packaged foods and beverages. Avoid the following types of food: ? Food that is high in salt (sodium). ? Food that is high in saturated fat, like full-fat dairy or red meat. ? Food that is high in trans fat, like fried food. ? Food and drinks that are high in sugar. Lifestyle  Do not use any products that contain nicotine or tobacco, such as cigarettes and e-cigarettes. If you need help quitting, ask your health care provider.  Take steps to manage and control your weight.  Get regular exercise. Aim for 150 minutes of moderate-intensity exercise (such as walking or yoga) or 75 minutes of vigorous exercise (such as running or swimming) each week.  Manage other health problems, such as diabetes or high blood pressure. Ask your health  care provider how you can manage these conditions. General instructions  Do not drive for 24 hours after your procedure if you were given a medicine to help you relax (sedative).  Take over-the-counter and prescription medicines only as told by your health care provider.  Avoid putting pressure on the area where the pacemaker was placed.  If you need an MRI after your pacemaker has been placed, be sure to tell the health care provider who orders the MRI that you have a pacemaker.  Avoid close and prolonged exposure to electrical devices that have strong  magnetic fields. These include: ? Cell phones. Avoid keeping them in a pocket near the pacemaker, and try using the ear opposite the pacemaker. ? MP3 players. ? Household appliances, like microwaves. ? Metal detectors. ? Electric generators. ? High-tension wires.  Keep all follow-up visits as directed by your health care provider. This is important. Contact a health care provider if:  You have pain at the incision site that is not relieved by over-the-counter or prescription medicines.  You have any of these around your incision site or coming from it: ? More redness, swelling, or pain. ? Fluid or blood. ? Warmth to the touch. ? Pus or a bad smell.  You have a fever.  You feel brief, occasional palpitations, light-headedness, or any symptoms that you think might be related to your heart. Get help right away if:  You experience chest pain that is different from the pain at the pacemaker site.  You develop a red streak that extends above or below the incision site.  You experience shortness of breath.  You have palpitations or an irregular heartbeat.  You have light-headedness that does not go away quickly.  You faint or have dizzy spells.  Your pulse suddenly drops or increases rapidly and does not return to normal.  You begin to gain weight and your legs and ankles swell. Summary  After your procedure, it is common to have pain, soreness, and some swelling where the pacemaker was inserted.  Make sure to keep your incision clean and dry. Follow instructions from your health care provider about how to take care of your incision.  Check your incision every day for signs of infection, such as more pain or swelling, pus or a bad smell, warmth, or leaking fluid and blood.  Avoid strenuous exercise and lifting your left arm higher than your shoulder for 2 weeks, or as long as told by your health care provider. This information is not intended to replace advice given to you by  your health care provider. Make sure you discuss any questions you have with your health care provider. Document Released: 07/27/2013 Document Revised: 09/18/2017 Document Reviewed: 08/28/2016 Elsevier Patient Education  2020 [employer].

## 2019-06-29 NOTE — H&P (Signed)
Cardiology Office Note Date:  06/22/2019  Patient ID:  [name], [name] 26-Jan-1928, MRN [id number] PCP:  Hillis Range, MD  Cardiologist: Dr. Marland Kitchen      Electrophysiologist:  Dr. Caryl Comes    Chief Complaint: device has reached ERI  History of Present Illness: [name] is a 85 y.o. female with history of CHB w/CRT-P, CAD (CABG, RCA stent), ICM, chronic CHF (systolic), DM, HTN, HLD, OSA w/CPAP, stroke (pt states 10 years ago had a few minutes of difficult speech).  She comes in today to be seen for Dr. Caryl Comes.  Last seen by him July 2019, at that time having exertional chest discomfort, Dr. Caryl Comes d/w Dr. [facility] (?), no changes were made to her medicines or programming, noting she was nearing ERI.   More recently noted to have reached ERI by remote transmission.  She is accompanied by her son.  She is doing well.  Denies any CP or palpitations, no SOB at rest, will get a liitte winded with walking around, but this is unchanged for years, denies any trouble with her ADLs.  No dizzy spells, no near syncope or syncope.  Device information: SJM CRT-P, implanted 11/29/12      Past Medical History:  Diagnosis Date  . Anemia   . Arthritis   . Celiac disease   . Chronic systolic CHF (congestive heart failure) ([place])   . Complete heart block ([place])   . Coronary artery disease   . Diabetes mellitus    INSULIN DEPENDENT  . GERD (gastroesophageal reflux disease)   . Glaucoma   . Headache(784.0)   . Heart disease   . Hypercholesterolemia   . Hypertension   . Iron deficiency anemia, unspecified   . Ischemic cardiomyopathy   . Memory difficulties 04/14/2019  . Shortness of breath   . Sleep apnea    uses cpap  . Stroke ([place])   . Unspecified constipation          Past Surgical History:  Procedure Laterality Date  . BACK SURGERY    . BI-VENTRICULAR PACEMAKER INSERTION (CRT-P)  March 2014   [facility]  . CARDIAC CATHETERIZATION  09/02/2012   . CARDIAC SURGERY    . CORONARY ANGIOPLASTY WITH STENT PLACEMENT  09/02/2012   RCA  . PERCUTANEOUS CORONARY STENT INTERVENTION (PCI-S) N/A 09/02/2012   Procedure: PERCUTANEOUS CORONARY STENT INTERVENTION (PCI-S);  Surgeon: Clent Demark, MD;  [facility] C[facility] LAB;  Service: Cardiovascular;  Laterality: N/A;  . PERMANENT PACEMAKER INSERTION N/A 11/29/2012   Procedure: PERMANENT PACEMAKER INSERTION;  Surgeon: Deboraha Sprang, MD;  Location: [facility] CATH LAB;  Service: Cardiovascular;  Laterality: N/A;  . TEMPORARY PACEMAKER INSERTION N/A 11/28/2012   Procedure: TEMPORARY PACEMAKER INSERTION;  Surgeon: Clent Demark, MD;  Location: [place] CATH LAB;  Service: Cardiovascular;  Laterality: N/A;          Current Outpatient Medications  Medication Sig Dispense Refill  . aspirin EC 81 MG tablet Take 81 mg by mouth daily.    Marland Kitchen atorvastatin (LIPITOR) 80 MG tablet Take 80 mg by mouth daily.  5  . BAYER CONTOUR TEST test strip     . carvedilol (COREG) 3.125 MG tablet Take 3.125 mg by mouth 2 (two) times daily.  3  . carvedilol (COREG) 6.25 MG tablet Take 1 tablet (6.25 mg total) by mouth 2 (two) times daily. 180 tablet 3  . clopidogrel (PLAVIX) 75 MG tablet Take 75 mg by mouth daily.    . COMBIGAN 0.2-0.5 %  ophthalmic solution Place 1 drop into both eyes every 12 (twelve) hours.     . docusate sodium 100 MG CAPS Take 100 mg by mouth 2 (two) times daily. 60 capsule 0  . donepezil (ARICEPT) 5 MG tablet TAKE 1 TABLET BY MOUTH EVERYDAY AT BEDTIME 30 tablet 5  . dorzolamide (TRUSOPT) 2 % ophthalmic solution Place 1 drop into both eyes 2 (two) times daily.     . ferrous sulfate 325 (65 FE) MG tablet Take 325 mg by mouth 2 (two) times daily.     Marland Kitchen HUMALOG KWIKPEN 100 UNIT/ML KiwkPen TAKE 10 UNITS BY SUBCUTANEOUS ROUTE 3 TIMES EVERY DAY WITH FOOD  3  . latanoprost (XALATAN) 0.005 % ophthalmic solution Appointment OVERDUE, place 1 drop into both eyes daily at bedtime 2.5 mL 0  . lubiprostone  (AMITIZA) 24 MCG capsule Take 1 capsule (24 mcg total) by mouth 2 (two) times daily with a meal. 90 capsule 3  . Multiple Vitamin (MULTIVITAMIN WITH MINERALS) TABS Take 1 tablet by mouth daily.    . nitroGLYCERIN (NITROSTAT) 0.4 MG SL tablet Place 0.4 mg under the tongue every 5 (five) minutes as needed. Chest pains    . NOVOFINE 32G X 6 MM MISC     . ofloxacin (OCUFLOX) 0.3 % ophthalmic solution     . pantoprazole (PROTONIX) 40 MG tablet Take 40 mg by mouth daily.     . pilocarpine (PILOCAR) 4 % ophthalmic solution Place 1 drop into both eyes 2 (two) times daily.     . polyethylene glycol powder (GLYCOLAX/MIRALAX) powder TAKE 17 G BY MOUTH DAILY. HOLD FOR LOOSE STOOL 3162 g 1  . prednisoLONE acetate (PRED FORTE) 1 % ophthalmic suspension     . valsartan-hydrochlorothiazide (DIOVAN-HCT) 320-25 MG per tablet Take 1 tablet by mouth daily.      No current facility-administered medications for this visit.     Allergies:   Patient has no known allergies.   Social History:  The patient  reports that she quit smoking about 42 years ago. She has never used smokeless tobacco. She reports that she does not drink alcohol or use drugs.   Family History:  The patient's family history includes Cancer in her sister; Dementia in her sister; Diabetes in her mother; Hyperlipidemia in her mother; Hypertension in her mother; Neuropathy in her sister.  ROS:  Please see the history of present illness.  All other systems are reviewed and otherwise negative.   PHYSICAL EXAM:  VS:  BP (!) 112/52   Pulse 63   Ht 5' 5"  (1.651 m)   Wt 177 lb (80.3 kg)   BMI 29.45 kg/m  BMI: Body mass index is 29.45 kg/m. Well nourished, well developed, in no acute distress  HEENT: normocephalic, atraumatic  Neck: no JVD, carotid bruits or masses Cardiac: RRR; no significant murmurs, no rubs, or gallops Lungs:  CTA b/l, no wheezing, rhonchi or rales  Abd: soft, nontender, obese MS: no deformity or  atrophy, she has a large soft tissue mass (reported to be lipoma) R shoulder Ext: no edema  Skin: warm and dry, no rash Neuro:  No gross deficits appreciated Psych: euthymic mood, full affect  PPM site is stable, no tethering or discomfort   EKG:  Done today and reviewed by myself shows AV paced PPM interrogation done today and reviewed by myself:  Battery reached ERI 05/11/2019, lead measurements are good AMS episodes noted Longest occurred 01/18/2019, this was true AFib 1 hour 21 minutes All others are <  1 minute duration, and viewed EGMs available are 1:1, and what looks competitive atrial pacing in review with [facility]JM representative   11/29/12: TTE Study Conclusions  - Left ventricle: The cavity size was normal. Wall thickness  was increased in a pattern of mild LVH. Systolic function  was mildly to moderately reduced. The estimated ejection  fraction was in the range of 40% to 45%. Possible moderate  hypokinesis of the basalinferior myocardium.  - Ventricular septum: Septal motion showed abnormal function  and dyssynergy.  - Tricuspid valve: Moderate regurgitation.  - Pulmonary arteries: Systolic pressure was mildly  increased.   Recent Labs: No results found for requested labs within last 8760 hours.  No results found for requested labs within last 8760 hours.   CrCl cannot be calculated (Patient's most recent lab result is older than the maximum 21 days allowed.).      Wt Readings from Last 3 Encounters:  06/22/19 177 lb (80.3 kg)  04/14/19 168 lb 5 oz (76.3 kg)  09/29/18 168 lb (76.2 kg)     Other studies reviewed: Additional studies/records reviewed today include: summarized above  ASSESSMENT AND PLAN:  1. CRT-P     reached ERI, 05/11/2019     dicussed generator change procedure, potential benefits and risks, she would like to proceed  She is pacer dependent, pacing at 30 today, will not delay her procedure Hold plavix 2 days prior  2.  ICM 3. Chronic CHF (systolic)     LVEF 97-74% last echo in 2014     No symptoms or exam findings of fluid OL  4. CAD     No symptoms of angina     She reports a cardiologist, she cannot recall the name     No interventions in >1 year, several years according to the patient  5. HTN     No changes today, looks good  6. HLD     Deferred to her PMD, she reports monitors and manages  7. Paroxysmal Afib     Back in March, presumably has been seen prior to now     She has h/o stroke very remote 10 years ago, ?? Can we make the connection to AFib now     No a/c for now, gen change next week.  Inclined to monitor burden via her device further     I will send to Dr. Caryl Comes for his input   Disposition: F/u with routine post generator change follow up.  Current medicines are reviewed at length with the patient today.  The patient did not have any concerns regarding medicines.  Mikle Bosworth.D.

## 2019-06-29 NOTE — Progress Notes (Signed)
D/c instructions reviewed with daughter Caren Griffins

## 2019-07-01 ENCOUNTER — Emergency Department (HOSPITAL_COMMUNITY): Payer: Medicare Other

## 2019-07-01 ENCOUNTER — Observation Stay (HOSPITAL_COMMUNITY)
Admission: EM | Admit: 2019-07-01 | Discharge: 2019-07-02 | Disposition: A | Discharge status: Home / Self Care | Payer: Medicare Other | Attending: Internal Medicine | Admitting: Internal Medicine

## 2019-07-01 DIAGNOSIS — G459 Transient cerebral ischemic attack, unspecified: Secondary | ICD-10-CM | POA: Diagnosis not present

## 2019-07-01 DIAGNOSIS — Z20828 Contact with and (suspected) exposure to other viral communicable diseases: Secondary | ICD-10-CM | POA: Insufficient documentation

## 2019-07-01 DIAGNOSIS — R41841 Cognitive communication deficit: Secondary | ICD-10-CM | POA: Insufficient documentation

## 2019-07-01 DIAGNOSIS — M1990 Unspecified osteoarthritis, unspecified site: Secondary | ICD-10-CM | POA: Diagnosis not present

## 2019-07-01 DIAGNOSIS — K219 Gastro-esophageal reflux disease without esophagitis: Secondary | ICD-10-CM | POA: Diagnosis not present

## 2019-07-01 DIAGNOSIS — Z951 Presence of aortocoronary bypass graft: Secondary | ICD-10-CM | POA: Insufficient documentation

## 2019-07-01 DIAGNOSIS — E785 Hyperlipidemia, unspecified: Secondary | ICD-10-CM | POA: Insufficient documentation

## 2019-07-01 DIAGNOSIS — K900 Celiac disease: Secondary | ICD-10-CM | POA: Insufficient documentation

## 2019-07-01 DIAGNOSIS — E7800 Pure hypercholesterolemia, unspecified: Secondary | ICD-10-CM | POA: Insufficient documentation

## 2019-07-01 DIAGNOSIS — Z7902 Long term (current) use of antithrombotics/antiplatelets: Secondary | ICD-10-CM | POA: Diagnosis not present

## 2019-07-01 DIAGNOSIS — R2681 Unsteadiness on feet: Secondary | ICD-10-CM | POA: Insufficient documentation

## 2019-07-01 DIAGNOSIS — Z8673 Personal history of transient ischemic attack (TIA), and cerebral infarction without residual deficits: Secondary | ICD-10-CM | POA: Diagnosis not present

## 2019-07-01 DIAGNOSIS — E1122 Type 2 diabetes mellitus with diabetic chronic kidney disease: Secondary | ICD-10-CM | POA: Insufficient documentation

## 2019-07-01 DIAGNOSIS — I10 Essential (primary) hypertension: Secondary | ICD-10-CM | POA: Diagnosis present

## 2019-07-01 DIAGNOSIS — D649 Anemia, unspecified: Secondary | ICD-10-CM | POA: Insufficient documentation

## 2019-07-01 DIAGNOSIS — E119 Type 2 diabetes mellitus without complications: Secondary | ICD-10-CM

## 2019-07-01 DIAGNOSIS — I081 Rheumatic disorders of both mitral and tricuspid valves: Secondary | ICD-10-CM | POA: Insufficient documentation

## 2019-07-01 DIAGNOSIS — H409 Unspecified glaucoma: Secondary | ICD-10-CM | POA: Diagnosis not present

## 2019-07-01 DIAGNOSIS — Z955 Presence of coronary angioplasty implant and graft: Secondary | ICD-10-CM | POA: Diagnosis not present

## 2019-07-01 DIAGNOSIS — I255 Ischemic cardiomyopathy: Secondary | ICD-10-CM | POA: Insufficient documentation

## 2019-07-01 DIAGNOSIS — Z79899 Other long term (current) drug therapy: Secondary | ICD-10-CM | POA: Insufficient documentation

## 2019-07-01 DIAGNOSIS — M6281 Muscle weakness (generalized): Secondary | ICD-10-CM | POA: Insufficient documentation

## 2019-07-01 DIAGNOSIS — I2510 Atherosclerotic heart disease of native coronary artery without angina pectoris: Secondary | ICD-10-CM | POA: Diagnosis not present

## 2019-07-01 DIAGNOSIS — I130 Hypertensive heart and chronic kidney disease with heart failure and stage 1 through stage 4 chronic kidney disease, or unspecified chronic kidney disease: Secondary | ICD-10-CM | POA: Diagnosis not present

## 2019-07-01 DIAGNOSIS — E049 Nontoxic goiter, unspecified: Secondary | ICD-10-CM | POA: Insufficient documentation

## 2019-07-01 DIAGNOSIS — N189 Chronic kidney disease, unspecified: Secondary | ICD-10-CM | POA: Insufficient documentation

## 2019-07-01 DIAGNOSIS — Z8249 Family history of ischemic heart disease and other diseases of the circulatory system: Secondary | ICD-10-CM | POA: Diagnosis not present

## 2019-07-01 DIAGNOSIS — I5022 Chronic systolic (congestive) heart failure: Secondary | ICD-10-CM | POA: Insufficient documentation

## 2019-07-01 DIAGNOSIS — G4730 Sleep apnea, unspecified: Secondary | ICD-10-CM | POA: Diagnosis not present

## 2019-07-01 DIAGNOSIS — Z7982 Long term (current) use of aspirin: Secondary | ICD-10-CM | POA: Insufficient documentation

## 2019-07-01 DIAGNOSIS — Z87891 Personal history of nicotine dependence: Secondary | ICD-10-CM | POA: Insufficient documentation

## 2019-07-01 DIAGNOSIS — Z950 Presence of cardiac pacemaker: Secondary | ICD-10-CM | POA: Insufficient documentation

## 2019-07-01 DIAGNOSIS — I700 Atherosclerosis of aorta: Secondary | ICD-10-CM | POA: Insufficient documentation

## 2019-07-01 DIAGNOSIS — Z794 Long term (current) use of insulin: Secondary | ICD-10-CM | POA: Insufficient documentation

## 2019-07-01 LAB — COMPREHENSIVE METABOLIC PANEL
ALT: 23 U/L (ref 0–44)
AST: 24 U/L (ref 15–41)
Albumin: 3.6 g/dL (ref 3.5–5.0)
Alkaline Phosphatase: 81 U/L (ref 38–126)
Anion gap: 8 (ref 5–15)
BUN: 23 mg/dL (ref 8–23)
CO2: 25 mmol/L (ref 22–32)
Calcium: 10.8 mg/dL — ABNORMAL HIGH (ref 8.9–10.3)
Chloride: 104 mmol/L (ref 98–111)
Creatinine, Ser: 2.1 mg/dL — ABNORMAL HIGH (ref 0.44–1.00)
GFR calc Af Amer: 24 mL/min — ABNORMAL LOW (ref >60)
GFR calc non Af Amer: 20 mL/min — ABNORMAL LOW (ref >60)
Glucose, Bld: 131 mg/dL — ABNORMAL HIGH (ref 70–99)
Potassium: 4.1 mmol/L (ref 3.5–5.1)
Sodium: 137 mmol/L (ref 135–145)
Total Bilirubin: 0.4 mg/dL (ref 0.3–1.2)
Total Protein: 6.8 g/dL (ref 6.5–8.1)

## 2019-07-01 LAB — URINALYSIS, ROUTINE W REFLEX MICROSCOPIC
Bilirubin Urine: NEGATIVE
Glucose, UA: NEGATIVE mg/dL
Hgb urine dipstick: NEGATIVE
Ketones, ur: NEGATIVE mg/dL
Leukocytes,Ua: NEGATIVE
Nitrite: NEGATIVE
Protein, ur: NEGATIVE mg/dL
Specific Gravity, Urine: 1.01 (ref 1.005–1.030)
pH: 6 (ref 5.0–8.0)

## 2019-07-01 LAB — DIFFERENTIAL
Abs Immature Granulocytes: 0.01 10*3/uL (ref 0.00–0.07)
Basophils Absolute: 0 10*3/uL (ref 0.0–0.1)
Basophils Relative: 1 %
Eosinophils Absolute: 0.1 10*3/uL (ref 0.0–0.5)
Eosinophils Relative: 1 %
Immature Granulocytes: 0 %
Lymphocytes Relative: 44 %
Lymphs Abs: 3.4 10*3/uL (ref 0.7–4.0)
Monocytes Absolute: 0.7 10*3/uL (ref 0.1–1.0)
Monocytes Relative: 9 %
Neutro Abs: 3.5 10*3/uL (ref 1.7–7.7)
Neutrophils Relative %: 45 %

## 2019-07-01 LAB — CBC
HCT: 34.2 % — ABNORMAL LOW (ref 36.0–46.0)
Hemoglobin: 10.8 g/dL — ABNORMAL LOW (ref 12.0–15.0)
MCH: 29 pg (ref 26.0–34.0)
MCHC: 31.6 g/dL (ref 30.0–36.0)
MCV: 91.9 fL (ref 80.0–100.0)
Platelets: 196 10*3/uL (ref 150–400)
RBC: 3.72 MIL/uL — ABNORMAL LOW (ref 3.87–5.11)
RDW: 12.3 % (ref 11.5–15.5)
WBC: 7.9 10*3/uL (ref 4.0–10.5)
nRBC: 0 % (ref 0.0–0.2)

## 2019-07-01 LAB — I-STAT CHEM 8, ED
BUN: 23 mg/dL (ref 8–23)
Calcium, Ion: 1.31 mmol/L (ref 1.15–1.40)
Chloride: 104 mmol/L (ref 98–111)
Creatinine, Ser: 2 mg/dL — ABNORMAL HIGH (ref 0.44–1.00)
Glucose, Bld: 121 mg/dL — ABNORMAL HIGH (ref 70–99)
HCT: 34 % — ABNORMAL LOW (ref 36.0–46.0)
Hemoglobin: 11.6 g/dL — ABNORMAL LOW (ref 12.0–15.0)
Potassium: 3.9 mmol/L (ref 3.5–5.1)
Sodium: 137 mmol/L (ref 135–145)
TCO2: 23 mmol/L (ref 22–32)

## 2019-07-01 LAB — APTT: aPTT: 35 seconds (ref 24–36)

## 2019-07-01 LAB — PROTIME-INR
INR: 1 (ref 0.8–1.2)
Prothrombin Time: 13.1 seconds (ref 11.4–15.2)

## 2019-07-01 MED ORDER — IOHEXOL 350 MG/ML SOLN
60.0000 mL | Freq: Once | INTRAVENOUS | Status: AC | PRN
  Administered 2019-07-01: 60 mL via INTRAVENOUS

## 2019-07-01 MED ORDER — SODIUM CHLORIDE 0.9% FLUSH: 3.0000 mL | Freq: Once | INTRAVENOUS | Status: DC

## 2019-07-01 NOTE — Consult Note (Addendum)
Neurology Consultation  Reason for Consult: Code stroke Referring Physician: Dr. Wilson Singer  CC: Slurred speech, altered mental status  History is obtained from: Patient's daughter, chart review  HPI: [name] is a 85 y.o. female past medical history of complete heart block, pacemaker which was changed 2 days ago, diabetes, systolic heart failure, hyperlipidemia, hypertension, memory deficits, sleep apnea presenting to the emergency room for evaluation of altered mental status and speech difficulties. Daughter spoke to the patient around 5:40 PM her speech sounded fine on the phone.  Later on when talking to her, it seemed like her speech made no sense.  She appeared confused.  9 1 was called.  When fire department arrived, she still appeared confused but by the time EMS arrived, her symptoms started to improve. No LOC.  No seizure-like activity.  No incontinence.  No headache no visual changes. At the time of examination on the ER bridge, her NIH stroke scale was 1 for mild right leg weakness which is due to the fall and hip pain. I spoke with the daughter [name], who confirmed the history above.  LKW: 5:40 PM tpa given?: no, NIH stroke scale 1. Premorbid modified Rankin scale (mRS): 3  ROS: ROS was performed and is negative except as noted in the HPI.    Past Medical History:  Diagnosis Date  . Anemia   . Arthritis   . Celiac disease   . Chronic systolic CHF (congestive heart failure) ([place])   . Complete heart block ([place])   . Coronary artery disease   . Diabetes mellitus    INSULIN DEPENDENT  . GERD (gastroesophageal reflux disease)   . Glaucoma   . Headache(784.0)   . Heart disease   . Hypercholesterolemia   . Hypertension   . Iron deficiency anemia, unspecified   . Ischemic cardiomyopathy   . Memory difficulties 04/14/2019  . Shortness of breath   . Sleep apnea    uses cpap  . Stroke ([place])   . Unspecified constipation    Family History  Problem  Relation Age of Onset  . Diabetes Mother   . Hypertension Mother   . Hyperlipidemia Mother   . Cancer Sister   . Dementia Sister   . Neuropathy Sister    Social History:   reports that she quit smoking about 42 years ago. She has never used smokeless tobacco. She reports that she does not drink alcohol or use drugs.  Medications  Current Facility-Administered Medications:  .  sodium chloride flush (NS) 0.9 % injection 3 mL, 3 mL, Intravenous, Once, Virgel Manifold, MD  Current Outpatient Medications:  .  aspirin EC 81 MG tablet, Take 81 mg by mouth daily., Disp: , Rfl:  .  atorvastatin (LIPITOR) 80 MG tablet, Take 80 mg by mouth daily., Disp: , Rfl: 5 .  BAYER CONTOUR TEST test strip, , Disp: , Rfl:  .  carvedilol (COREG) 3.125 MG tablet, Take 3.125 mg by mouth 2 (two) times daily., Disp: , Rfl: 3 .  carvedilol (COREG) 6.25 MG tablet, Take 1 tablet (6.25 mg total) by mouth 2 (two) times daily., Disp: 180 tablet, Rfl: 3 .  clopidogrel (PLAVIX) 75 MG tablet, Take 75 mg by mouth daily., Disp: , Rfl:  .  COMBIGAN 0.2-0.5 % ophthalmic solution, Place 1 drop into both eyes every 12 (twelve) hours. , Disp: , Rfl:  .  docusate sodium 100 MG CAPS, Take 100 mg by mouth 2 (two) times daily., Disp: 60 capsule, Rfl: 0 .  donepezil (ARICEPT) 5 MG tablet, TAKE 1 TABLET BY MOUTH EVERYDAY AT BEDTIME, Disp: 30 tablet, Rfl: 5 .  dorzolamide (TRUSOPT) 2 % ophthalmic solution, Place 1 drop into both eyes 2 (two) times daily. , Disp: , Rfl:  .  ferrous sulfate 325 (65 FE) MG tablet, Take 325 mg by mouth 2 (two) times daily. , Disp: , Rfl:  .  HUMALOG KWIKPEN 100 UNIT/ML KiwkPen, TAKE 10 UNITS BY SUBCUTANEOUS ROUTE 3 TIMES EVERY DAY WITH FOOD, Disp: , Rfl: 3 .  latanoprost (XALATAN) 0.005 % ophthalmic solution, Appointment OVERDUE, place 1 drop into both eyes daily at bedtime, Disp: 2.5 mL, Rfl: 0 .  lubiprostone (AMITIZA) 24 MCG capsule, Take 1 capsule (24 mcg total) by mouth 2 (two) times daily with a meal.,  Disp: 90 capsule, Rfl: 3 .  Multiple Vitamin (MULTIVITAMIN WITH MINERALS) TABS, Take 1 tablet by mouth daily., Disp: , Rfl:  .  nitroGLYCERIN (NITROSTAT) 0.4 MG SL tablet, Place 0.4 mg under the tongue every 5 (five) minutes as needed. Chest pains, Disp: , Rfl:  .  NOVOFINE 32G X 6 MM MISC, , Disp: , Rfl:  .  ofloxacin (OCUFLOX) 0.3 % ophthalmic solution, , Disp: , Rfl:  .  pantoprazole (PROTONIX) 40 MG tablet, Take 40 mg by mouth daily. , Disp: , Rfl:  .  pilocarpine (PILOCAR) 4 % ophthalmic solution, Place 1 drop into both eyes 2 (two) times daily. , Disp: , Rfl:  .  polyethylene glycol powder (GLYCOLAX/MIRALAX) powder, TAKE 17 G BY MOUTH DAILY. HOLD FOR LOOSE STOOL, Disp: 3162 g, Rfl: 1 .  prednisoLONE acetate (PRED FORTE) 1 % ophthalmic suspension, , Disp: , Rfl:  .  valsartan-hydrochlorothiazide (DIOVAN-HCT) 320-25 MG per tablet, Take 1 tablet by mouth daily. , Disp: , Rfl:    Exam: Current vital signs: BP (!) 149/62   Pulse 69   Resp 17   SpO2 97%  Vital signs in last 24 hours: Pulse Rate:  [69] 69 (09/11 1930) Resp:  [17] 17 (09/11 1930) BP: (149)/(62) 149/62 (09/11 1930) SpO2:  [97 %] 97 % (09/11 1930) Weight:  [79.4 kg] 79.4 kg (09/11 1800) GENERAL: Awake, alert in NAD HEENT: - Normocephalic and atraumatic, dry mm, no LN++, no Thyromegally LUNGS - Clear to auscultation bilaterally with no wheezes CV - S1S2 RRR, no m/r/g, equal pulses bilaterally. ABDOMEN - Soft, nontender, nondistended with normoactive BS Ext: warm, well perfused, intact peripheral pulses, trace edema  NEURO:  Mental Status: AA&Ox3  Language: speech is not dysarthrc.  Naming, repetition, fluency, and comprehension intact. Cranial Nerves: PERRL. EOMI, visual fields full, no facial asymmetry, facial sensation intact, hearing intact, tongue/uvula/soft palate midline, normal sternocleidomastoid and trapezius muscle strength. No evidence of tongue atrophy or fibrillations Motor: 5/5 in all 4s except RLE limited  by pain and mild drift. Tone: is normal and bulk is normal Sensation- Intact to light touch bilaterally Coordination: FTN intact bilaterally Gait- deferred  NIHSS -1   Labs I have reviewed labs in epic and the results pertinent to this consultation are:  CBC    Component Value Date/Time   WBC 7.9 07/01/2019 1852   RBC 3.72 (L) 07/01/2019 1852   HGB 11.6 (L) 07/01/2019 1857   HCT 34.0 (L) 07/01/2019 1857   PLT 196 07/01/2019 1852   MCV 91.9 07/01/2019 1852   MCH 29.0 07/01/2019 1852   MCHC 31.6 07/01/2019 1852   RDW 12.3 07/01/2019 1852   RDW 13.5 12/14/2013 1622   LYMPHSABS 3.4 07/01/2019 1852  LYMPHSABS 2.8 12/14/2013 1622   MONOABS 0.7 07/01/2019 1852   EOSABS 0.1 07/01/2019 1852   EOSABS 0.1 12/14/2013 1622   BASOSABS 0.0 07/01/2019 1852   BASOSABS 0.0 12/14/2013 1622    CMP     Component Value Date/Time   NA 137 07/01/2019 1857   NA 141 12/14/2013 1622   K 3.9 07/01/2019 1857   CL 104 07/01/2019 1857   CO2 27 06/29/2019 0759   GLUCOSE 121 (H) 07/01/2019 1857   BUN 23 07/01/2019 1857   BUN 18 12/14/2013 1622   CREATININE 2.00 (H) 07/01/2019 1857   CALCIUM 11.5 (H) 06/29/2019 0759   PROT 7.5 12/14/2013 1622   ALBUMIN 4.1 12/14/2013 1622   AST 17 12/14/2013 1622   ALT 15 12/14/2013 1622   ALKPHOS 85 12/14/2013 1622   BILITOT <0.2 12/14/2013 1622   GFRNONAA 33 (L) 06/29/2019 0759   GFRAA 38 (L) 06/29/2019 0759    Lipid Panel     Component Value Date/Time   CHOL 194 03/28/2013 1021   TRIG 126 03/28/2013 1021   HDL 44 03/28/2013 1021   CHOLHDL 4.4 03/28/2013 1021   CHOLHDL 4.9 11/29/2012 0530   VLDL 24 11/29/2012 0530   LDLCALC 125 (H) 03/28/2013 1021   Imaging I have reviewed the images obtained: CT-scan of the brain-aspects 10, no bleed. CTA head/neck: severe stenosis RICA siphon, mod stenosis LICA siphon. No emergent LVO.  Assessment: 85 year old with above said past medical history presented with brief speech difficulties-unclear if aphasia  or dysarthria and confusion/altered mental status.  Symptoms nearly resolved and back to baseline. CT head unremarkable.  CTA brain bilateral carotid stenosis of the siphon. Unclear if this was an event of hypoperfusion likely to cardiac etiology versus a true TIA. Recent pacemaker placement and prior pacemaker was old-doubt she will be a candidate for MRI. Would recommend pacemaker interrogation stroke/TIA work-up.  Impression: Evaluate for stroke/TIA Evaluate for arrhythmia  Recommendations: -Admit to hospitalist -Telemetry monitoring -Allow for permissive hypertension for the first 24-48h - only treat PRN if SBP >220 mmHg. Blood pressures can be gradually normalized to SBP<140 upon discharge. -Echocardiogram -HgbA1c, fasting lipid panel -Frequent neuro checks -Prophylactic therapy-Antiplatelet med: On home dual antiplatelets-continue dual antiplatelets. -Atorvastatin 80 mg PO daily -Risk factor modification -PT consult, OT consult, Speech consult  Discussed my plan in detail with the daughter Ms. Dynegy. Discussed my plan with Dr. Virgel Manifold in the emergency room.  Please page stroke NP/PA/MD (listed on AMION)  from 8am-4 pm as this patient will be followed by the stroke team at this point.  -- Amie Portland, MD [facility] Pager: [phone] If 7pm to 7am, please call on call as listed on AMION.

## 2019-07-01 NOTE — Code Documentation (Signed)
Patient a home with family when her legs became "shaky"  And she had difficulty speaking, slurred speech.LKW at 1740.  Code stroke called en route at 1643  Stat head CT and labs done.  Dr Rory Percy at bedside to assess patient.  NIHSS 1, right leg drift.  CTA done Hand off with ED RN Heron Nay

## 2019-07-01 NOTE — ED Triage Notes (Signed)
Patient arrived by EMS with a possible stroke. Pt family called 37 because patient became shaky and had slurred speech. Last known well was 1745. EMS placed an 18 gauge IV in the left AC.

## 2019-07-02 ENCOUNTER — Encounter (HOSPITAL_COMMUNITY): Payer: Medicare Other

## 2019-07-02 ENCOUNTER — Encounter (HOSPITAL_COMMUNITY): Payer: Self-pay | Admitting: Internal Medicine

## 2019-07-02 ENCOUNTER — Observation Stay (HOSPITAL_BASED_OUTPATIENT_CLINIC_OR_DEPARTMENT_OTHER): Payer: Medicare Other

## 2019-07-02 ENCOUNTER — Observation Stay (HOSPITAL_COMMUNITY): Payer: Medicare Other

## 2019-07-02 DIAGNOSIS — E1149 Type 2 diabetes mellitus with other diabetic neurological complication: Secondary | ICD-10-CM | POA: Diagnosis not present

## 2019-07-02 DIAGNOSIS — I10 Essential (primary) hypertension: Secondary | ICD-10-CM | POA: Diagnosis not present

## 2019-07-02 DIAGNOSIS — G459 Transient cerebral ischemic attack, unspecified: Secondary | ICD-10-CM

## 2019-07-02 DIAGNOSIS — I361 Nonrheumatic tricuspid (valve) insufficiency: Secondary | ICD-10-CM

## 2019-07-02 LAB — DIFFERENTIAL
Basophils Absolute: 0 10*3/uL (ref 0.0–0.1)
Basophils Relative: 0 %
Eosinophils Absolute: 0.1 10*3/uL (ref 0.0–0.5)
Eosinophils Relative: 2 %
Lymphocytes Relative: 49 %
Lymphs Abs: 3.6 10*3/uL (ref 0.7–4.0)
Monocytes Absolute: 0.6 10*3/uL (ref 0.1–1.0)
Monocytes Relative: 8 %
Neutro Abs: 3 10*3/uL (ref 1.7–7.7)
Neutrophils Relative %: 41 %

## 2019-07-02 LAB — HEMOGLOBIN A1C
Hgb A1c MFr Bld: 7.6 % — ABNORMAL HIGH (ref 4.8–5.6)
Mean Plasma Glucose: 171.42 mg/dL

## 2019-07-02 LAB — COMPREHENSIVE METABOLIC PANEL
ALT: 25 U/L (ref 0–44)
AST: 30 U/L (ref 15–41)
Albumin: 3.6 g/dL (ref 3.5–5.0)
Alkaline Phosphatase: 79 U/L (ref 38–126)
Anion gap: 11 (ref 5–15)
BUN: 22 mg/dL (ref 8–23)
CO2: 24 mmol/L (ref 22–32)
Calcium: 11.1 mg/dL — ABNORMAL HIGH (ref 8.9–10.3)
Chloride: 103 mmol/L (ref 98–111)
Creatinine, Ser: 1.79 mg/dL — ABNORMAL HIGH (ref 0.44–1.00)
GFR calc Af Amer: 29 mL/min — ABNORMAL LOW (ref >60)
GFR calc non Af Amer: 25 mL/min — ABNORMAL LOW (ref >60)
Glucose, Bld: 163 mg/dL — ABNORMAL HIGH (ref 70–99)
Potassium: 4.2 mmol/L (ref 3.5–5.1)
Sodium: 138 mmol/L (ref 135–145)
Total Bilirubin: 0.5 mg/dL (ref 0.3–1.2)
Total Protein: 7.1 g/dL (ref 6.5–8.1)

## 2019-07-02 LAB — ECHOCARDIOGRAM COMPLETE
Height: 64 in
Weight: 2800.72 oz

## 2019-07-02 LAB — CBC
HCT: 35.1 % — ABNORMAL LOW (ref 36.0–46.0)
Hemoglobin: 10.9 g/dL — ABNORMAL LOW (ref 12.0–15.0)
MCH: 28.5 pg (ref 26.0–34.0)
MCHC: 31.1 g/dL (ref 30.0–36.0)
MCV: 91.9 fL (ref 80.0–100.0)
Platelets: 155 10*3/uL (ref 150–400)
RBC: 3.82 MIL/uL — ABNORMAL LOW (ref 3.87–5.11)
RDW: 12.6 % (ref 11.5–15.5)
WBC: 7.5 10*3/uL (ref 4.0–10.5)
nRBC: 0 % (ref 0.0–0.2)

## 2019-07-02 LAB — LIPID PANEL
Cholesterol: 165 mg/dL (ref 0–200)
HDL: 55 mg/dL (ref >40)
LDL Cholesterol: 95 mg/dL (ref 0–99)
Total CHOL/HDL Ratio: 3 RATIO
Triglycerides: 74 mg/dL (ref <150)
VLDL: 15 mg/dL (ref 0–40)

## 2019-07-02 LAB — GLUCOSE, CAPILLARY
Glucose-Capillary: 118 mg/dL — ABNORMAL HIGH (ref 70–99)
Glucose-Capillary: 135 mg/dL — ABNORMAL HIGH (ref 70–99)
Glucose-Capillary: 124 mg/dL — ABNORMAL HIGH (ref 70–99)

## 2019-07-02 LAB — SARS CORONAVIRUS 2 (TAT 6-24 HRS): SARS Coronavirus 2: NEGATIVE

## 2019-07-02 MED ORDER — ENOXAPARIN SODIUM 30 MG/0.3ML ~~LOC~~ SOLN
30.0000 mg | Freq: Every day | SUBCUTANEOUS | Status: DC
  Administered 2019-07-02: 30 mg via SUBCUTANEOUS
  Filled 2019-07-02: qty 0.3

## 2019-07-02 MED ORDER — CALCITRIOL 0.25 MCG PO CAPS
0.2500 ug | ORAL_CAPSULE | Freq: Every morning | ORAL | Status: DC
  Administered 2019-07-02: 0.25 ug via ORAL
  Filled 2019-07-02: qty 1

## 2019-07-02 MED ORDER — CLOPIDOGREL BISULFATE 75 MG PO TABS: 75.0000 mg | ORAL_TABLET | Freq: Every day | ORAL | 0 refills | Status: DC

## 2019-07-02 MED ORDER — SODIUM CHLORIDE 0.9 % IV BOLUS: 500.0000 mL | Freq: Once | INTRAVENOUS | Status: DC

## 2019-07-02 MED ORDER — NIACIN 100 MG PO TABS
100.0000 mg | ORAL_TABLET | Freq: Every day | ORAL | Status: DC
  Filled 2019-07-02: qty 1

## 2019-07-02 MED ORDER — ACETAMINOPHEN 325 MG PO TABS: 650.0000 mg | ORAL_TABLET | ORAL | Status: DC | PRN

## 2019-07-02 MED ORDER — NITROGLYCERIN 0.4 MG SL SUBL: 0.4000 mg | SUBLINGUAL_TABLET | SUBLINGUAL | Status: DC | PRN

## 2019-07-02 MED ORDER — BRIMONIDINE TARTRATE-TIMOLOL 0.2-0.5 % OP SOLN: 1.0000 [drp] | Freq: Two times a day (BID) | OPHTHALMIC | Status: DC

## 2019-07-02 MED ORDER — CARVEDILOL 3.125 MG PO TABS
3.1250 mg | ORAL_TABLET | Freq: Two times a day (BID) | ORAL | Status: DC
  Administered 2019-07-02 (×2): 3.125 mg via ORAL
  Filled 2019-07-02 (×2): qty 1

## 2019-07-02 MED ORDER — INSULIN GLARGINE 100 UNIT/ML ~~LOC~~ SOLN
10.0000 [IU] | Freq: Every day | SUBCUTANEOUS | Status: DC
  Filled 2019-07-02: qty 0.1

## 2019-07-02 MED ORDER — ACETAMINOPHEN 160 MG/5ML PO SOLN: 650.0000 mg | ORAL | Status: DC | PRN

## 2019-07-02 MED ORDER — DONEPEZIL HCL 10 MG PO TABS: 10.0000 mg | ORAL_TABLET | Freq: Every day | ORAL | Status: DC

## 2019-07-02 MED ORDER — LATANOPROST 0.005 % OP SOLN
1.0000 [drp] | Freq: Every day | OPHTHALMIC | Status: DC
  Filled 2019-07-02: qty 2.5

## 2019-07-02 MED ORDER — PANTOPRAZOLE SODIUM 40 MG PO TBEC
40.0000 mg | DELAYED_RELEASE_TABLET | Freq: Every morning | ORAL | Status: DC
  Administered 2019-07-02: 11:00:00 40 mg via ORAL
  Filled 2019-07-02: qty 1

## 2019-07-02 MED ORDER — STROKE: EARLY STAGES OF RECOVERY BOOK
Freq: Once | Status: AC
  Administered 2019-07-02: 06:00:00
  Filled 2019-07-02: qty 1

## 2019-07-02 MED ORDER — AMLODIPINE BESYLATE 2.5 MG PO TABS
2.5000 mg | ORAL_TABLET | Freq: Every morning | ORAL | Status: DC
  Administered 2019-07-02: 11:00:00 2.5 mg via ORAL
  Filled 2019-07-02: qty 1

## 2019-07-02 MED ORDER — BRIMONIDINE TARTRATE 0.2 % OP SOLN
1.0000 [drp] | Freq: Two times a day (BID) | OPHTHALMIC | Status: DC
  Administered 2019-07-02: 1 [drp] via OPHTHALMIC
  Filled 2019-07-02: qty 5

## 2019-07-02 MED ORDER — CLOPIDOGREL BISULFATE 75 MG PO TABS
75.0000 mg | ORAL_TABLET | Freq: Every day | ORAL | Status: DC
  Administered 2019-07-02: 13:00:00 75 mg via ORAL
  Filled 2019-07-02: qty 1

## 2019-07-02 MED ORDER — INSULIN ASPART 100 UNIT/ML ~~LOC~~ SOLN
0.0000 [IU] | Freq: Three times a day (TID) | SUBCUTANEOUS | Status: DC
  Administered 2019-07-02 (×2): 1 [IU] via SUBCUTANEOUS

## 2019-07-02 MED ORDER — SODIUM CHLORIDE 0.9 % IV BOLUS
250.0000 mL | Freq: Once | INTRAVENOUS | Status: AC
  Administered 2019-07-02: 250 mL via INTRAVENOUS

## 2019-07-02 MED ORDER — NIACIN 100 MG PO TABS: 100.0000 mg | ORAL_TABLET | Freq: Every day | ORAL | 0 refills | Status: DC

## 2019-07-02 MED ORDER — DORZOLAMIDE HCL 2 % OP SOLN
1.0000 [drp] | Freq: Two times a day (BID) | OPHTHALMIC | Status: DC
  Administered 2019-07-02: 11:00:00 1 [drp] via OPHTHALMIC
  Filled 2019-07-02: qty 10

## 2019-07-02 MED ORDER — ATORVASTATIN CALCIUM 80 MG PO TABS
80.0000 mg | ORAL_TABLET | Freq: Every morning | ORAL | Status: DC
  Administered 2019-07-02: 80 mg via ORAL
  Filled 2019-07-02: qty 1

## 2019-07-02 MED ORDER — ASPIRIN EC 81 MG PO TBEC
81.0000 mg | DELAYED_RELEASE_TABLET | Freq: Every morning | ORAL | Status: DC
  Administered 2019-07-02: 11:00:00 81 mg via ORAL
  Filled 2019-07-02: qty 1

## 2019-07-02 MED ORDER — TIMOLOL MALEATE 0.5 % OP SOLN
1.0000 [drp] | Freq: Two times a day (BID) | OPHTHALMIC | Status: DC
  Administered 2019-07-02: 1 [drp] via OPHTHALMIC
  Filled 2019-07-02: qty 5

## 2019-07-02 MED ORDER — IRBESARTAN 300 MG PO TABS: 300.0000 mg | ORAL_TABLET | Freq: Every day | ORAL | Status: DC

## 2019-07-02 MED ORDER — HYDRALAZINE HCL 20 MG/ML IJ SOLN: 10.0000 mg | INTRAMUSCULAR | Status: DC | PRN

## 2019-07-02 MED ORDER — ASPIRIN EC 81 MG PO TBEC: 81.0000 mg | DELAYED_RELEASE_TABLET | Freq: Every morning | ORAL | 0 refills | Status: AC

## 2019-07-02 MED ORDER — ACETAMINOPHEN 650 MG RE SUPP: 650.0000 mg | RECTAL | Status: DC | PRN

## 2019-07-02 NOTE — Progress Notes (Signed)
PROGRESS NOTE                                                                                                                                                                                                             Patient Demographics:    [name], is a 85 y.o. female, DOB - 1928-07-04, TGY:[id number]  Admit date - 07/01/2019   Admitting Physician [name], MD  Outpatient Primary MD for the patient is [name], MD  LOS - 0  Chief Complaint  Patient presents with  . Code Stroke       Brief Narrative  [name] is a 85 y.o. female with history of CAD status post CABG and stenting, ischemic cardiomyopathy last EF measured in 2014 was 40-45 present, complete heart block status post pacemaker recent pacemaker generator change 2 days ago was brought to the ER after patient's daughter found the patient was having difficulty speaking and appeared confused.  In the ER head CT was nonacute, CT angiogram did show some large vessel occlusion.  Neurology was consulted and she was admitted for further TIA work-up.   Subjective:    [name] today has, No headache, No chest pain, No abdominal pain - No Nausea, No new weakness tingling or numbness, No Cough - SOB.     Assessment  & Plan :     1.  Possible TIA.  Pacemaker precludes MRI being done.  CT and CT angiogram findings below, neuro on board, currently symptom-free and close to baseline, on ASA - Statin, will add niacin for better LDL control, A1c is elevated and will defer to PCP for further outpatient glycemic control.  Some confusion if patient was on Plavix prior to this admission, for now have discussed the case with neurologist Dr. Rory Percy and place the patient on Plavix.  TTE pending, pacemaker interrogation ordered. Neuro to follow.   Lab Results  Component Value Date   HGBA1C 7.6 (H) 07/02/2019   Lipid Panel     Component Value Date/Time   CHOL 165 07/02/2019 0437   CHOL 194  03/28/2013 1021   TRIG 74 07/02/2019 0437   HDL 55 07/02/2019 0437   HDL 44 03/28/2013 1021   CHOLHDL 3.0 07/02/2019 0437   VLDL 15 07/02/2019 0437   LDLCALC 95 07/02/2019 0437   LDLCALC 125 (H) 03/28/2013 1021     2. Incidental finding of right-sided thyroid goiter.  Outpatient  PCP follow-up.  3.  Dyslipidemia.  LDL above goal.  Add niacin to full dose statin.  4.  GERD.  On PPI.  5.  Hypertension.  Permissive hypertension for now PRN IV hydralazine written.  For now low-dose Norvasc as well.  6.  Mild early dementia.  On home medications.  7. DM type II.  In poor outpatient control due to hyperglycemia.  Lantus and sliding scale outpatient follow-up with PCP for further glycemic control.  CBG (last 3)  Recent Labs    07/02/19 0804  GLUCAP 118*     Family Communication  :  None  Code Status :  Full  Disposition Plan  :  Home 1-2 days  Consults  :  Neuro  Procedures  :    CT & CTA -    No acute intracranial findings.   1. Negative for large vessel occlusion. 2. But positive for multiple hemodynamically significant stenoses: - Severe Right ICA siphon stenosis due to calcified plaque. - Moderate contralateral left ICA siphon stenosis due to calcified plaque. - 70% stenosis of the right subclavian artery origin. - Moderate stenosis of both distal vertebral arteries as they cross the dura due to calcified plaque. 3. Aortic Atherosclerosis (ICD10-I70.0). 4. Right thyroid goiter.   DVT Prophylaxis  :  Lovenox    Lab Results  Component Value Date   PLT 155 07/02/2019    Diet :  Diet Order            Diet heart healthy/carb modified Room service appropriate? Yes; Fluid consistency: Thin  Diet effective now               Inpatient Medications Scheduled Meds: . amLODipine  2.5 mg Oral q morning - 10a  . aspirin EC  81 mg Oral q morning - 10a  . atorvastatin  80 mg Oral q morning - 10a  . brimonidine  1 drop Both Eyes BID   And  . timolol  1 drop Both Eyes  BID  . calcitRIOL  0.25 mcg Oral q morning - 10a  . carvedilol  3.125 mg Oral BID WC  . donepezil  10 mg Oral QHS  . dorzolamide  1 drop Both Eyes BID  . enoxaparin (LOVENOX) injection  30 mg Subcutaneous Daily  . insulin aspart  0-9 Units Subcutaneous TID WC  . insulin glargine  10 Units Subcutaneous Q2200  . latanoprost  1 drop Both Eyes QHS  . pantoprazole  40 mg Oral q morning - 10a  . sodium chloride flush  3 mL Intravenous Once   Continuous Infusions: PRN Meds:.acetaminophen **OR** acetaminophen (TYLENOL) oral liquid 160 mg/5 mL **OR** acetaminophen, hydrALAZINE, nitroGLYCERIN  Antibiotics  :   Anti-infectives (From admission, onward)   None          Objective:   Vitals:   07/02/19 0550 07/02/19 0551 07/02/19 0805 07/02/19 1056  BP: (!) 164/66 (!) 164/66 (!) 154/73 (!) 179/72  Pulse: 70 70 74 77  Resp: 18 18 14 16   Temp: 97.7 F (36.5 C) 97.7 F (36.5 C) 97.7 F (36.5 C) 97.9 F (36.6 C)  TempSrc:  Oral    SpO2: 100% 100% 98% 100%    Wt Readings from Last 3 Encounters:  07/01/19 79.4 kg  06/22/19 80.3 kg  04/14/19 76.3 kg    No intake or output data in the 24 hours ending 07/02/19 1110   Physical Exam  Awake Alert,  No new F.N deficits, Normal affect [place].AT,PERRAL Supple Neck,No JVD, No cervical lymphadenopathy appriciated.  Symmetrical Chest wall movement, Good air  movement bilaterally, CTAB RRR,No Gallops,Rubs or new Murmurs, No Parasternal Heave +ve B.Sounds, Abd Soft, No tenderness, No organomegaly appriciated, No rebound - guarding or rigidity. No Cyanosis, Clubbing or edema, No new Rash or bruise       Data Review:    CBC Recent Labs  Lab 06/29/19 0759 07/01/19 1852 07/01/19 1857 07/02/19 0437  WBC 7.6 7.9  --  7.5  HGB 11.4* 10.8* 11.6* 10.9*  HCT 35.7* 34.2* 34.0* 35.1*  PLT 198 196  --  155  MCV 91.1 91.9  --  91.9  MCH 29.1 29.0  --  28.5  MCHC 31.9 31.6  --  31.1  RDW 12.5 12.3  --  12.6  LYMPHSABS  --  3.4  --  3.6   MONOABS  --  0.7  --  0.6  EOSABS  --  0.1  --  0.1  BASOSABS  --  0.0  --  0.0    Chemistries  Recent Labs  Lab 06/29/19 0759 07/01/19 1852 07/01/19 1857 07/02/19 0437  NA 138 137 137 138  K 4.0 4.1 3.9 4.2  CL 104 104 104 103  CO2 27 25  --  24  GLUCOSE 114* 131* 121* 163*  BUN 20 23 23 22   CREATININE 1.42* 2.10* 2.00* 1.79*  CALCIUM 11.5* 10.8*  --  11.1*  AST  --  24  --  30  ALT  --  23  --  25  ALKPHOS  --  81  --  79  BILITOT  --  0.4  --  0.5   ------------------------------------------------------------------------------------------------------------------ Recent Labs    07/02/19 0437  CHOL 165  HDL 55  LDLCALC 95  TRIG 74  CHOLHDL 3.0    Lab Results  Component Value Date   HGBA1C 7.6 (H) 07/02/2019   ------------------------------------------------------------------------------------------------------------------ No results for input(s): TSH, T4TOTAL, T3FREE, THYROIDAB in the last 72 hours.  Invalid input(s): FREET3 ------------------------------------------------------------------------------------------------------------------ No results for input(s): VITAMINB12, FOLATE, FERRITIN, TIBC, IRON, RETICCTPCT in the last 72 hours.  Coagulation profile Recent Labs  Lab 07/01/19 1852  INR 1.0    No results for input(s): DDIMER in the last 72 hours.  Cardiac Enzymes No results for input(s): CKMB, TROPONINI, MYOGLOBIN in the last 168 hours.  Invalid input(s): CK ------------------------------------------------------------------------------------------------------------------ No results found for: BNP  Micro Results Recent Results (from the past 240 hour(s))  Novel Coronavirus, NAA (Hosp order, Send-out to Ref Lab; TAT 18-24 hrs     Status: None   Collection Time: 06/24/19 11:13 AM   Specimen: Nasopharyngeal Swab; Respiratory  Result Value Ref Range Status   SARS-CoV-2, NAA NOT DETECTED NOT DETECTED Final    Comment: (NOTE) Testing was  performed using the cobas(R) SARS-CoV-2 test. This nucleic acid amplification test was developed and its performance characteristics determined by [employer]. Nucleic acid amplification tests include PCR and TMA. This test has not been FDA cleared or approved. This test has been authorized by FDA under an Emergency Use Authorization (EUA). This test is only authorized for the duration of time the declaration that circumstances exist justifying the authorization of the emergency use of in vitro diagnostic tests for detection of SARS-CoV-2 virus and/or diagnosis of COVID-19 infection under section 564(b)(1) of the Act, 21 U.S.C. 956OZH-0(Q) (1), unless the authorization is terminated or revoked sooner. When diagnostic testing is negative, the possibility of a false negative result should be considered in the context of a patient's recent exposures and the presence of clinical signs and symptoms consistent with  COVID-19. An individual without s ymptoms of COVID- 19 and who is not shedding SARS-CoV-2 virus would expect to have a negative (not detected) result in this assay. Performed At: [facility] [address] [place], [place] [id number] Rush Farmer MD ID:[id number]    [place]  Final    Comment: Performed at [facility], [place] [address], [place], [place] [address]  Surgical PCR screen     Status: None   Collection Time: 06/29/19  8:21 AM   Specimen: Nasal Mucosa; Nasal Swab  Result Value Ref Range Status   MRSA, PCR NEGATIVE NEGATIVE Final   Staphylococcus aureus NEGATIVE NEGATIVE Final    Comment: (NOTE) The Xpert SA Assay (FDA approved for NASAL specimens in patients 50 years of age and older), is one component of a comprehensive surveillance program. It is not intended to diagnose infection nor to guide or monitor treatment. Performed at [facility], [place] [address], [place], [place] [address]   SARS CORONAVIRUS 2  (TAT 6-24 HRS) Nasopharyngeal Nasopharyngeal Swab     Status: None   Collection Time: 07/01/19 10:35 PM   Specimen: Nasopharyngeal Swab  Result Value Ref Range Status   SARS Coronavirus 2 NEGATIVE NEGATIVE Final    Comment: (NOTE) SARS-CoV-2 target nucleic acids are NOT DETECTED. The SARS-CoV-2 RNA is generally detectable in upper and lower respiratory specimens during the acute phase of infection. Negative results do not preclude SARS-CoV-2 infection, do not rule out co-infections with other pathogens, and should not be used as the sole basis for treatment or other patient management decisions. Negative results must be combined with clinical observations, patient history, and epidemiological information. The expected result is Negative. Fact Sheet for Patients: SugarRoll.be Fact Sheet for Healthcare Providers: https://www.woods-mathews.com/ This test is not yet approved or cleared by the [place] FDA and  has been authorized for detection and/or diagnosis of SARS-CoV-2 by FDA under an Emergency Use Authorization (EUA). This EUA will remain  in effect (meaning this test can be used) for the duration of the COVID-19 declaration under Section 56 4(b)(1) of the Act, 21 U.S.C. section 360bbb-3(b)(1), unless the authorization is terminated or revoked sooner. Performed at [facility], [place] [address], [place], [place] [address]     Radiology Reports Ct Angio Head W Or Wo Contrast  Result Date: 07/01/2019 CLINICAL DATA:  Code stroke. 85 year old female with unexplained altered mental status and slurred speech. Last seen normal 1740 hours. EXAM: CT ANGIOGRAPHY HEAD AND NECK TECHNIQUE: Multidetector CT imaging of the head and neck was performed using the standard protocol during bolus administration of intravenous contrast. Multiplanar CT image reconstructions and MIPs were obtained to evaluate the vascular anatomy. Carotid stenosis  measurements (when applicable) are obtained utilizing NASCET criteria, using the distal internal carotid diameter as the denominator. CONTRAST:  48m OMNIPAQUE IOHEXOL 350 MG/ML SOLN COMPARISON:  Plain head CT 1901 hours today. FINDINGS: CTA NECK Skeleton: Areas of poor dentition. Upper cervical spine ankylosis and degeneration. Prior sternotomy. No acute osseous abnormality identified. Upper chest: Negative visible upper lungs. No superior mediastinal lymphadenopathy. Partially visible left chest cardiac pacemaker type device. Other neck: Large right thyroid goiter with some retrosternal extension. Mild regional mass effect, no airway narrowing. Other neck soft tissues are within normal limits. Aortic arch: Calcified aortic atherosclerosis. Three vessel arch configuration. Right carotid system: Streak artifact at the thoracic inlet affecting the right CCA origin. Tortuous brachiocephalic artery and proximal right CCA, partially due to mass effect from  the right thyroid goiter. No definite proximal right CCA stenosis. Calcified plaque at the right ICA origin and bulb resulting in less than 50 % stenosis with respect to the distal vessel. Left carotid system: No left CCA origin stenosis despite plaque. Mildly tortuous left CCA. Soft and calcified plaque at the left ICA origin and bulb without stenosis to the skull base. Vertebral arteries: Proximal right subclavian artery calcified plaque resulting in right subclavian origin stenosis of 70 % with respect to the distal vessel (series 8, image 121). Superimposed bulky calcified plaque at the right vertebral artery origin but only mild stenosis. The right vertebral artery is patent to the skull base with mild V3 segment calcified plaque, no extracranial stenosis. Soft and calcified plaque in the proximal left subclavian artery without significant stenosis. Plaque near the left vertebral artery, but no origin stenosis. The left vertebral is mildly dominant and patent to  the skull base without stenosis. CTA HEAD Posterior circulation: Bilateral proximal V4 segment calcified plaque. Moderate associated bilateral distal vertebral artery stenosis (series 7, image 156). Both PICA origins are patent nearby. Both vertebral arteries remain patent to the vertebrobasilar junction without additional plaque or stenosis. The basilar artery is diminutive but patent. SCA and PCA origins are patent. There is mild irregularity and stenosis at the left PCA origin on series 11, image 25. Right PCA branches are within normal limits. Mild left PCA branch irregularity. Posterior communicating arteries are diminutive or absent. Anterior circulation: Both ICA siphons are patent. Moderate calcified plaque on the left with moderate multifocal left ICA siphon stenosis including the cavernous and supraclinoid segments. On the right there is widespread siphon calcification and severe intermittent stenosis of the right siphon from the mid cavernous segment through the supraclinoid segment. See series 7, image 120). But both carotid termini remain patent. The left is dominant with a dominant left and diminutive or absent right ACA A1 segment. There is a dominant A2, simulating azygos ACA anatomy. ACA branches are within normal limits. Left MCA M1 and bifurcation are patent without stenosis. Left MCA branches within normal limits. Right MCA M1 and bifurcation are patent without stenosis. Right MCA branches are within normal limits. Venous sinuses: Early contrast timing, not well evaluated. Anatomic variants: Mildly dominant left vertebral artery. Dominant left and diminutive or absent right ACA A1 segments. Review of the MIP images confirms the above findings IMPRESSION: 1. Negative for large vessel occlusion. 2. But positive for multiple hemodynamically significant stenoses: - Severe Right ICA siphon stenosis due to calcified plaque. - Moderate contralateral left ICA siphon stenosis due to calcified plaque. -  70% stenosis of the right subclavian artery origin. - Moderate stenosis of both distal vertebral arteries as they cross the dura due to calcified plaque. 3. Aortic Atherosclerosis (ICD10-I70.0). 4. Right thyroid goiter. Arterial findings were communicated to Dr. Cheral Marker at 7:40 pm on 07/01/2019 by text page via the [facility] messaging system. Electronically Signed   By: Genevie Ann M.D.   On: 07/01/2019 19:41   Ct Angio Neck W Or Wo Contrast  Result Date: 07/01/2019 CLINICAL DATA:  Code stroke. 85 year old female with unexplained altered mental status and slurred speech. Last seen normal 1740 hours. EXAM: CT ANGIOGRAPHY HEAD AND NECK TECHNIQUE: Multidetector CT imaging of the head and neck was performed using the standard protocol during bolus administration of intravenous contrast. Multiplanar CT image reconstructions and MIPs were obtained to evaluate the vascular anatomy. Carotid stenosis measurements (when applicable) are obtained utilizing NASCET criteria, using the distal internal  carotid diameter as the denominator. CONTRAST:  59m OMNIPAQUE IOHEXOL 350 MG/ML SOLN COMPARISON:  Plain head CT 1901 hours today. FINDINGS: CTA NECK Skeleton: Areas of poor dentition. Upper cervical spine ankylosis and degeneration. Prior sternotomy. No acute osseous abnormality identified. Upper chest: Negative visible upper lungs. No superior mediastinal lymphadenopathy. Partially visible left chest cardiac pacemaker type device. Other neck: Large right thyroid goiter with some retrosternal extension. Mild regional mass effect, no airway narrowing. Other neck soft tissues are within normal limits. Aortic arch: Calcified aortic atherosclerosis. Three vessel arch configuration. Right carotid system: Streak artifact at the thoracic inlet affecting the right CCA origin. Tortuous brachiocephalic artery and proximal right CCA, partially due to mass effect from the right thyroid goiter. No definite proximal right CCA stenosis. Calcified  plaque at the right ICA origin and bulb resulting in less than 50 % stenosis with respect to the distal vessel. Left carotid system: No left CCA origin stenosis despite plaque. Mildly tortuous left CCA. Soft and calcified plaque at the left ICA origin and bulb without stenosis to the skull base. Vertebral arteries: Proximal right subclavian artery calcified plaque resulting in right subclavian origin stenosis of 70 % with respect to the distal vessel (series 8, image 121). Superimposed bulky calcified plaque at the right vertebral artery origin but only mild stenosis. The right vertebral artery is patent to the skull base with mild V3 segment calcified plaque, no extracranial stenosis. Soft and calcified plaque in the proximal left subclavian artery without significant stenosis. Plaque near the left vertebral artery, but no origin stenosis. The left vertebral is mildly dominant and patent to the skull base without stenosis. CTA HEAD Posterior circulation: Bilateral proximal V4 segment calcified plaque. Moderate associated bilateral distal vertebral artery stenosis (series 7, image 156). Both PICA origins are patent nearby. Both vertebral arteries remain patent to the vertebrobasilar junction without additional plaque or stenosis. The basilar artery is diminutive but patent. SCA and PCA origins are patent. There is mild irregularity and stenosis at the left PCA origin on series 11, image 25. Right PCA branches are within normal limits. Mild left PCA branch irregularity. Posterior communicating arteries are diminutive or absent. Anterior circulation: Both ICA siphons are patent. Moderate calcified plaque on the left with moderate multifocal left ICA siphon stenosis including the cavernous and supraclinoid segments. On the right there is widespread siphon calcification and severe intermittent stenosis of the right siphon from the mid cavernous segment through the supraclinoid segment. See series 7, image 120). But both  carotid termini remain patent. The left is dominant with a dominant left and diminutive or absent right ACA A1 segment. There is a dominant A2, simulating azygos ACA anatomy. ACA branches are within normal limits. Left MCA M1 and bifurcation are patent without stenosis. Left MCA branches within normal limits. Right MCA M1 and bifurcation are patent without stenosis. Right MCA branches are within normal limits. Venous sinuses: Early contrast timing, not well evaluated. Anatomic variants: Mildly dominant left vertebral artery. Dominant left and diminutive or absent right ACA A1 segments. Review of the MIP images confirms the above findings IMPRESSION: 1. Negative for large vessel occlusion. 2. But positive for multiple hemodynamically significant stenoses: - Severe Right ICA siphon stenosis due to calcified plaque. - Moderate contralateral left ICA siphon stenosis due to calcified plaque. - 70% stenosis of the right subclavian artery origin. - Moderate stenosis of both distal vertebral arteries as they cross the dura due to calcified plaque. 3. Aortic Atherosclerosis (ICD10-I70.0). 4. Right thyroid goiter.  Arterial findings were communicated to Dr. Cheral Marker at 7:40 pm on 07/01/2019 by text page via the [facility] messaging system. Electronically Signed   By: Genevie Ann M.D.   On: 07/01/2019 19:41   Ct Head Code Stroke Wo Contrast  Result Date: 07/01/2019 CLINICAL DATA:  Code stroke. 85 year old female with unexplained altered mental status and slurred speech. Last seen normal 1740 hours. EXAM: CT HEAD WITHOUT CONTRAST TECHNIQUE: Contiguous axial images were obtained from the base of the skull through the vertex without intravenous contrast. COMPARISON:  Head CT 02/09/2016. FINDINGS: Brain: Stable cerebral volume since 2017. No ventriculomegaly. No midline shift, mass effect, or evidence of intracranial mass lesion. No acute intracranial hemorrhage identified. Patchy white matter hypodensity in both hemispheres appears mild  for age and stable. No cortically based acute infarct identified. No cortical encephalomalacia identified. Vascular: Calcified atherosclerosis at the skull base. No suspicious intracranial vascular hyperdensity. Skull: No acute osseous abnormality identified. Sinuses/Orbits: Visualized paranasal sinuses and mastoids are stable and well pneumatized. Other: Interval postoperative changes to both globes. No acute orbit or scalp soft tissue finding. ASPECTS [facility] Stroke [facility] Early CT Score) Total score (0-10 with 10 being normal): 10 IMPRESSION: 1. No acute intracranial abnormality. Stable non contrast CT appearance of the brain since 2017. 2.  ASPECTS 10. 3. These results were communicated to Drs. Aora and Lindzen at 7:19 pm on 07/01/2019 by text page via the [facility] messaging system. Electronically Signed   By: Genevie Ann M.D.   On: 07/01/2019 19:20    Time Spent in minutes  30   Lala Lund M.D on 07/02/2019 at 11:10 AM  To page go to [email or web address] - password [facility]

## 2019-07-02 NOTE — Progress Notes (Signed)
07/02/19 1410  PT Visit Information  Last PT Received On 07/02/19  Assistance Needed +1  History of Present Illness 85 y.o. female with history of CAD status post CABG and stenting, ischemic cardiomyopathy, complete heart block status post pacemaker recent pacemaker generator change 2 days ago was brought to the ER after patient's daughter found the patient was having difficulty speaking and appeared confused.  In the ER head CT was negative for acute findings, CT angiogram did show some large vessel occlusion.  Neurology was consulted and she was admitted for further TIA work-up.  Precautions  Precautions Fall  Restrictions  Weight Bearing Restrictions No  Home Living  Family/patient expects to be discharged to: Private residence  Living Arrangements Children  Available Help at Discharge Family;Available 24 hours/day  Type of Home House  Home Access Level entry  Home Layout One level  Bathroom Shower/Tub Tub/shower unit  Research officer, [employer] - single point  Prior Function  Level of Independence Needs assistance  Gait / Transfers Assistance Needed pt reports use of SPC for mobility  ADL's / [place] pt's daughter assists with bathing  Communication  Communication HOH  Pain Assessment  Pain Assessment No/denies pain  Cognition  Arousal/Alertness Awake/alert  Behavior During Therapy WFL for tasks assessed/performed  Overall Cognitive Status Within Functional Limits for tasks assessed  General Comments for basic tasks, pt slightly HOH so requires some repetition   Upper Extremity Assessment  Upper Extremity Assessment Defer to OT evaluation  Lower Extremity Assessment  Lower Extremity Assessment Generalized weakness  Cervical / Trunk Assessment  Cervical / Trunk Assessment Kyphotic  Bed Mobility  General bed mobility comments Sitting in recliner upon entry.   Transfers  Overall transfer level Needs assistance  Equipment used  Rolling walker (2 wheeled)  Transfers Sit to/from Stand  Sit to Stand Min assist  General transfer comment boosting assist to rise and steady at RW, VCs for safe hand placement  Ambulation/Gait  Ambulation/Gait assistance Min assist;Min guard  Gait Distance (Feet) 100 Feet  Assistive device Rolling walker (2 wheeled)  Gait Pattern/deviations Step-through pattern;Decreased stride length;Trunk flexed  General Gait Details Slow, mildly unsteady gait requiring min to min guard A. Required occasional assist for RW management. Cues for upright posture and proximity to device.   Gait velocity Decreased   Balance  Overall balance assessment Needs assistance  Sitting-balance support Feet supported  Sitting balance-Leahy Scale Good  Standing balance support Bilateral upper extremity supported;During functional activity  Standing balance-Leahy Scale Poor  Standing balance comment reliant on BUE support   General Comments  General comments (skin integrity, edema, etc.) son present during session  PT - End of Session  Equipment Utilized During Treatment Gait belt  Activity Tolerance Patient tolerated treatment well  Patient left in chair;with call bell/phone within reach;with family/visitor present  Nurse Communication Mobility status  PT Assessment  PT Recommendation/Assessment Patient needs continued PT services  PT Visit Diagnosis Unsteadiness on feet (R26.81);Muscle weakness (generalized) (M62.81)  PT Problem List Decreased strength;Decreased balance;Decreased mobility;Decreased knowledge of use of DME;Decreased knowledge of precautions  PT Plan  PT Frequency (ACUTE ONLY) Min 3X/week  PT Treatment/Interventions (ACUTE ONLY) DME instruction;Gait training;Functional mobility training;Therapeutic activities;Therapeutic exercise;Balance training;Patient/family education  AM-PAC PT "6 Clicks" Mobility Outcome Measure (Version 2)  Help needed turning from your back to your side while in a flat bed  without using bedrails? 3  Help needed moving from lying on your back to sitting on the side  of a flat bed without using bedrails? 3  Help needed moving to and from a bed to a chair (including a wheelchair)? 3  Help needed standing up from a chair using your arms (e.g., wheelchair or bedside chair)? 3  Help needed to walk in hospital room? 3  Help needed climbing 3-5 steps with a railing?  2  6 Click Score 17  Consider Recommendation of Discharge To: Home with [facility]  PT Recommendation  Follow Up Recommendations Home health PT;Supervision for mobility/OOB  PT equipment Rolling walker with 5" wheels  Individuals Consulted  Consulted and Agree with Results and Recommendations Patient  Acute Rehab PT Goals  Patient Stated Goal to go home  PT Goal Formulation With patient  Time For Goal Achievement 07/16/19  Potential to Achieve Goals Good  PT Time Calculation  PT Start Time (ACUTE ONLY) 1313  PT Stop Time (ACUTE ONLY) 1330  PT Time Calculation (min) (ACUTE ONLY) 17 min  PT General Charges  $$ ACUTE PT VISIT 1 Visit  PT Evaluation  $PT Eval Low Complexity 1 Low  Written Expression  Dominant Hand Right   Pt admitted secondary to problem above with deficits above. Pt requiring min to min guard A for mobility tasks using RW. Educated about using RW at home to increase safety. Reports she lives with her son and family available to assist at d/c. Will continue to follow acutely to maximize functional mobility independence and safety.   Leighton Ruff, PT, DPT  Acute Rehabilitation Services  Pager: [phone] Office: [phone]

## 2019-07-02 NOTE — Discharge Instructions (Signed)
Follow with Primary MD [name], MD in 7 days   Get CBC, CMP, 2 view Chest X ray -  checked next visit within 1 week by Primary MD    Activity: As tolerated with Full fall precautions use walker/cane & assistance as needed  Disposition Home   Diet: Heart Healthy - Low Carb.  Accuchecks 4 times/day, Once in AM empty stomach and then before each meal. Log in all results and show them to your Prim.MD in 3 days. If any glucose reading is under 80 or above 300 call your Prim MD immidiately. Follow Low glucose instructions for glucose under 80 as instructed.   Special Instructions: If you have smoked or chewed Tobacco  in the last 2 yrs please stop smoking, stop any regular Alcohol  and or any Recreational drug use.  On your next visit with your primary care physician please Get Medicines reviewed and adjusted.  Please request your Prim.MD to go over all Hospital Tests and Procedure/Radiological results at the follow up, please get all Hospital records sent to your Prim MD by signing hospital release before you go home.  If you experience worsening of your admission symptoms, develop shortness of breath, life threatening emergency, suicidal or homicidal thoughts you must seek medical attention immediately by calling 911 or calling your MD immediately  if symptoms less severe.  You Must read complete instructions/literature along with all the possible adverse reactions/side effects for all the Medicines you take and that have been prescribed to you. Take any new Medicines after you have completely understood and accpet all the possible adverse reactions/side effects.

## 2019-07-02 NOTE — Progress Notes (Signed)
[name] to be D/C'd Home per MD order.  Discussed with the patient and all questions fully answered.  VSS, Skin clean, dry and intact without evidence of skin break down, no evidence of skin tears noted. IV catheter discontinued intact. Site without signs and symptoms of complications. Dressing and pressure applied.  An After Visit Summary was printed and given to the patient. Patient received prescription.  D/c education completed with patient/family including follow up instructions, medication list, d/c activities limitations if indicated, with other d/c instructions as indicated by MD - patient able to verbalize understanding, all questions fully answered.   Patient instructed to return to ED, call 911, or call MD for any changes in condition.   Patient escorted via [place], and D/C home via private auto.  [name]  [facility] 07/02/2019 7:30 PM

## 2019-07-02 NOTE — Evaluation (Signed)
Occupational Therapy Evaluation Patient Details Name: [name] MRN: [id number] DOB: 09-10-1928 Today's Date: 07/02/2019    History of Present Illness 85 y.o. female with history of CAD status post CABG and stenting, ischemic cardiomyopathy last EF measured in 2014 was 40-45 present, complete heart block status post pacemaker recent pacemaker generator change 2 days ago was brought to the ER after patient's daughter found the patient was having difficulty speaking and appeared confused.  In the ER head CT was nonacute, CT angiogram did show some large vessel occlusion.  Neurology was consulted and she was admitted for further TIA work-up.   Clinical Impression   This 85 y/o female presents with the above. PTA pt reports mod independence with functional mobility using SPC; reports assist for bathing ADL from her daughter. Pt completing functional mobility using RW this session with overall minA. She currently requires minA for LB and standing grooming ADL, setup/minguard assist for seated UB ADL. Pt's son present and supportive throughout session, reports family able to provide 24hr supervision/assist at time of discharge. Pt will benefit from continued acute OT services and recommend follow up [place] services to maximize her safety and independence with ADL and mobility. Will follow.     Follow Up Recommendations  Home health OT;Supervision/Assistance - 24 hour    Equipment Recommendations  3 in 1 bedside commode           Precautions / Restrictions Precautions Precautions: Fall Restrictions Weight Bearing Restrictions: No      Mobility Bed Mobility Overal bed mobility: Needs Assistance Bed Mobility: Supine to Sit     Supine to sit: Min assist     General bed mobility comments: light minA for trunk support  Transfers Overall transfer level: Needs assistance Equipment used: Rolling walker (2 wheeled) Transfers: Sit to/from Stand Sit to Stand: Min assist          General transfer comment: boosting assist to rise and steady at RW, VCs for safe hand placement    Balance Overall balance assessment: Needs assistance Sitting-balance support: Feet supported Sitting balance-Leahy Scale: Good     Standing balance support: Bilateral upper extremity supported;During functional activity Standing balance-Leahy Scale: Fair Standing balance comment: pt able to maintain static standing with close minguard assist; overall reliant on UE support/external assist especially with dynamic tasks                           ADL either performed or assessed with clinical judgement   ADL Overall ADL's : Needs assistance/impaired Eating/Feeding: Independent;Sitting Eating/Feeding Details (indicate cue type and reason): lunch tray present end of session Grooming: Oral care;Wash/dry face;Minimal assistance;Standing Grooming Details (indicate cue type and reason): assist for standing balance Upper Body Bathing: Sitting;Min guard   Lower Body Bathing: Minimal assistance;Sit to/from stand   Upper Body Dressing : Set up;Min guard;Sitting   Lower Body Dressing: Minimal assistance;Sit to/from stand   Toilet Transfer: Minimal assistance;Ambulation;RW Toilet Transfer Details (indicate cue type and reason): simulated via transfer to recliner, room level mobility Toileting- Clothing Manipulation and Hygiene: Minimal assistance;Sit to/from stand       Functional mobility during ADLs: Minimal assistance;Rolling walker                           Pertinent Vitals/Pain Pain Assessment: No/denies pain     Hand Dominance Right   Extremity/Trunk Assessment Upper Extremity Assessment Upper Extremity Assessment: Generalized weakness   Lower  Extremity Assessment Lower Extremity Assessment: Defer to PT evaluation       Communication Communication Communication: HOH   Cognition Arousal/Alertness: Awake/alert Behavior During Therapy: WFL for tasks  assessed/performed Overall Cognitive Status: Within Functional Limits for tasks assessed                                 General Comments: for basic tasks, pt slightly HOH so requires some repetition    General Comments  son present during session    Exercises     Shoulder Instructions      Home Living Family/patient expects to be discharged to:: Private residence Living Arrangements: Children Available Help at Discharge: Family;Available 24 hours/day Type of Home: House Home Access: Level entry     Home Layout: One level     Bathroom Shower/Tub: Teacher, early years/pre: Standard     Home Equipment: Cane - single point          Prior Functioning/Environment Level of Independence: Independent with assistive device(s);Needs assistance  Gait / Transfers Assistance Needed: pt reports use of SPC for mobility ADL's / Homemaking Assistance Needed: pt's daughter assists with bathing            OT Problem List: Decreased strength;Decreased range of motion;Decreased activity tolerance;Impaired balance (sitting and/or standing);Decreased knowledge of use of DME or AE      OT Treatment/Interventions: Self-care/ADL training;Therapeutic exercise;Neuromuscular education;DME and/or AE instruction;Therapeutic activities;Patient/family education;Balance training    OT Goals(Current goals can be found in the care plan section) Acute Rehab OT Goals Patient Stated Goal: home soon OT Goal Formulation: With patient Time For Goal Achievement: 07/16/19 Potential to Achieve Goals: Good  OT Frequency: Min 2X/week   Barriers to D/C:            Co-evaluation              AM-PAC OT "6 Clicks" Daily Activity     Outcome Measure Help from another person eating meals?: None Help from another person taking care of personal grooming?: None Help from another person toileting, which includes using toliet, bedpan, or urinal?: None Help from another person  bathing (including washing, rinsing, drying)?: A Little Help from another person to put on and taking off regular upper body clothing?: None Help from another person to put on and taking off regular lower body clothing?: A Little 6 Click Score: 22   End of Session Equipment Utilized During Treatment: Gait belt;Rolling walker Nurse Communication: Mobility status  Activity Tolerance: Patient tolerated treatment well Patient left: in chair;with call bell/phone within reach;with family/visitor present  OT Visit Diagnosis: Unsteadiness on feet (R26.81);Muscle weakness (generalized) (M62.81)                Time: 9150-5697 OT Time Calculation (min): 30 min Charges:  OT General Charges $OT Visit: 1 Visit OT Evaluation $OT Eval Moderate Complexity: 1 Mod OT Treatments $Self Care/Home Management : 8-22 mins  Lou Cal, OT Supplemental Rehabilitation Services Pager [phone] Office [place] 07/02/2019, 1:39 PM

## 2019-07-02 NOTE — ED Notes (Signed)
ED TO INPATIENT HANDOFF REPORT  ED Nurse Name and Phone #: Judeth Porch Name/Age/Gender [name] 85 y.o. female Room/Bed: 031C/031C  Code Status   Code Status: Prior  Home/SNF/Other Home Patient oriented to: self, place, time and situation Is this baseline? Yes   Triage Complete: Triage complete  Chief Complaint CODE STROKE  Triage Note Patient arrived by EMS with a possible stroke. Pt family called 19 because patient became shaky and had slurred speech. Last known well was 1745. EMS placed an 18 gauge IV in the left AC.    Allergies No Known Allergies  Level of Care/Admitting Diagnosis ED Disposition    ED Disposition Condition Comment   Admit  Hospital Area: [facility] [[id number]]  Level of Care: Telemetry Medical [104]  I expect the patient will be discharged within 24 hours: No (not a candidate for 5C-Observation unit)  Covid Evaluation: Asymptomatic Screening Protocol (No Symptoms)  Diagnosis: TIA (transient ischemic attack) [408144]  Admitting Physician: [name] [phone]  Attending Physician: [name] [[id number]  PT Class (Do Not Modify): Observation [104]  PT Acc Code (Do Not Modify): Observation [10022]       B Medical/Surgery History Past Medical History:  Diagnosis Date  . Anemia   . Arthritis   . Celiac disease   . Chronic systolic CHF (congestive heart failure) ([place])   . Complete heart block ([place])   . Coronary artery disease   . Diabetes mellitus    INSULIN DEPENDENT  . GERD (gastroesophageal reflux disease)   . Glaucoma   . Headache(784.0)   . Heart disease   . Hypercholesterolemia   . Hypertension   . Iron deficiency anemia, unspecified   . Ischemic cardiomyopathy   . Memory difficulties 04/14/2019  . Shortness of breath   . Sleep apnea    uses cpap  . Stroke ([place])   . Unspecified constipation    Past Surgical History:  Procedure Laterality Date  . BACK SURGERY    . BI-VENTRICULAR  PACEMAKER INSERTION (CRT-P)  March 2014   [facility]  . BIV PACEMAKER GENERATOR CHANGEOUT N/A 06/29/2019   Procedure: BIV PACEMAKER GENERATOR CHANGEOUT;  Surgeon: Evans Lance, MD;  Location: [facility] CV LAB;  Service: Cardiovascular;  Laterality: N/A;  . CARDIAC CATHETERIZATION  09/02/2012  . CARDIAC SURGERY    . CORONARY ANGIOPLASTY WITH STENT PLACEMENT  09/02/2012   RCA  . PERCUTANEOUS CORONARY STENT INTERVENTION (PCI-S) N/A 09/02/2012   Procedure: PERCUTANEOUS CORONARY STENT INTERVENTION (PCI-S);  Surgeon: Clent Demark, MD;  Location: [facility] CATH LAB;  Service: Cardiovascular;  Laterality: N/A;  . PERMANENT PACEMAKER INSERTION N/A 11/29/2012   Procedure: PERMANENT PACEMAKER INSERTION;  Surgeon: Deboraha Sprang, MD;  Location: [facility] CATH LAB;  Service: Cardiovascular;  Laterality: N/A;  . TEMPORARY PACEMAKER INSERTION N/A 11/28/2012   Procedure: TEMPORARY PACEMAKER INSERTION;  Surgeon: Clent Demark, MD;  Location: [facility] [place] CATH LAB;  Service: Cardiovascular;  Laterality: N/A;     A IV Location/Drains/Wounds Patient Lines/Drains/Airways Status   Active Line/Drains/Airways    None          Intake/Output Last 24 hours No intake or output data in the 24 hours ending 07/02/19 0222  Labs/Imaging Results for orders placed or performed during the hospital encounter of 07/01/19 (from the past 48 hour(s))  Protime-INR     Status: None   Collection Time: 07/01/19  6:52 PM  Result Value Ref Range   Prothrombin Time 13.1 11.4 - 15.2  seconds   INR 1.0 0.8 - 1.2    Comment: (NOTE) INR goal varies based on device and disease states. Performed at [facility], [place] [address], [place], [place] [address]   APTT     Status: None   Collection Time: 07/01/19  6:52 PM  Result Value Ref Range   aPTT 35 24 - 36 seconds    Comment: Performed at [place] [address] [place], [place] [address]  CBC     Status: Abnormal   Collection Time: 07/01/19  6:52 PM  Result Value Ref  Range   WBC 7.9 4.0 - 10.5 K/uL   RBC 3.72 (L) 3.87 - 5.11 MIL/uL   Hemoglobin 10.8 (L) 12.0 - 15.0 g/dL   HCT 34.2 (L) 36.0 - 46.0 %   MCV 91.9 80.0 - 100.0 fL   MCH 29.0 26.0 - 34.0 pg   MCHC 31.6 30.0 - 36.0 g/dL   RDW 12.3 11.5 - 15.5 %   Platelets 196 150 - 400 K/uL   nRBC 0.0 0.0 - 0.2 %    Comment: Performed at [facility], [place] [address] [place], [place] [address]  Differential     Status: None   Collection Time: 07/01/19  6:52 PM  Result Value Ref Range   Neutrophils Relative % 45 %   Neutro Abs 3.5 1.7 - 7.7 K/uL   Lymphocytes Relative 44 %   Lymphs Abs 3.4 0.7 - 4.0 K/uL   Monocytes Relative 9 %   Monocytes Absolute 0.7 0.1 - 1.0 K/uL   Eosinophils Relative 1 %   Eosinophils Absolute 0.1 0.0 - 0.5 K/uL   Basophils Relative 1 %   Basophils Absolute 0.0 0.0 - 0.1 K/uL   Immature Granulocytes 0 %   Abs Immature Granulocytes 0.01 0.00 - 0.07 K/uL    Comment: Performed at [place] [address] [place], [place] [address]  Comprehensive metabolic panel     Status: Abnormal   Collection Time: 07/01/19  6:52 PM  Result Value Ref Range   Sodium 137 135 - 145 mmol/L   Potassium 4.1 3.5 - 5.1 mmol/L   Chloride 104 98 - 111 mmol/L   CO2 25 22 - 32 mmol/L   Glucose, Bld 131 (H) 70 - 99 mg/dL   BUN 23 8 - 23 mg/dL   Creatinine, Ser 2.10 (H) 0.44 - 1.00 mg/dL   Calcium 10.8 (H) 8.9 - 10.3 mg/dL   Total Protein 6.8 6.5 - 8.1 g/dL   Albumin 3.6 3.5 - 5.0 g/dL   AST 24 15 - 41 U/L   ALT 23 0 - 44 U/L   Alkaline Phosphatase 81 38 - 126 U/L   Total Bilirubin 0.4 0.3 - 1.2 mg/dL   GFR calc non Af Amer 20 (L) >60 mL/min   GFR calc Af Amer 24 (L) >60 mL/min   Anion gap 8 5 - 15    Comment: Performed at [place] [address], [place],  [address]  I-stat chem 8, ED     Status: Abnormal   Collection Time: 07/01/19  6:57 PM  Result Value Ref Range   Sodium 137 135 - 145 mmol/L   Potassium 3.9 3.5 - 5.1 mmol/L   Chloride 104 98 - 111  mmol/L   BUN 23 8 - 23 mg/dL   Creatinine, Ser 2.00 (H) 0.44 - 1.00 mg/dL   Glucose, Bld 121 (H) 70 - 99 mg/dL  Calcium, Ion 1.31 1.15 - 1.40 mmol/L   TCO2 23 22 - 32 mmol/L   Hemoglobin 11.6 (L) 12.0 - 15.0 g/dL   HCT 34.0 (L) 36.0 - 46.0 %  Urinalysis, Routine w reflex microscopic     Status: Abnormal   Collection Time: 07/01/19  8:44 PM  Result Value Ref Range   Color, Urine STRAW (A) YELLOW   APPearance CLEAR CLEAR   Specific Gravity, Urine 1.010 1.005 - 1.030   pH 6.0 5.0 - 8.0   Glucose, UA NEGATIVE NEGATIVE mg/dL   Hgb urine dipstick NEGATIVE NEGATIVE   Bilirubin Urine NEGATIVE NEGATIVE   Ketones, ur NEGATIVE NEGATIVE mg/dL   Protein, ur NEGATIVE NEGATIVE mg/dL   Nitrite NEGATIVE NEGATIVE   Leukocytes,Ua NEGATIVE NEGATIVE    Comment: Performed at [facility], [place] [address] [place], [place] [address]   Ct Angio Head W Or Wo Contrast  Result Date: 07/01/2019 CLINICAL DATA:  Code stroke. 85 year old female with unexplained altered mental status and slurred speech. Last seen normal 1740 hours. EXAM: CT ANGIOGRAPHY HEAD AND NECK TECHNIQUE: Multidetector CT imaging of the head and neck was performed using the standard protocol during bolus administration of intravenous contrast. Multiplanar CT image reconstructions and MIPs were obtained to evaluate the vascular anatomy. Carotid stenosis measurements (when applicable) are obtained utilizing NASCET criteria, using the distal internal carotid diameter as the denominator. CONTRAST:  16m OMNIPAQUE IOHEXOL 350 MG/ML SOLN COMPARISON:  Plain head CT 1901 hours today. FINDINGS: CTA NECK Skeleton: Areas of poor dentition. Upper cervical spine ankylosis and degeneration. Prior sternotomy. No acute osseous abnormality identified. Upper chest: Negative visible upper lungs. No superior mediastinal lymphadenopathy. Partially visible left chest cardiac pacemaker type device. Other neck: Large right thyroid goiter with some retrosternal  extension. Mild regional mass effect, no airway narrowing. Other neck soft tissues are within normal limits. Aortic arch: Calcified aortic atherosclerosis. Three vessel arch configuration. Right carotid system: Streak artifact at the thoracic inlet affecting the right CCA origin. Tortuous brachiocephalic artery and proximal right CCA, partially due to mass effect from the right thyroid goiter. No definite proximal right CCA stenosis. Calcified plaque at the right ICA origin and bulb resulting in less than 50 % stenosis with respect to the distal vessel. Left carotid system: No left CCA origin stenosis despite plaque. Mildly tortuous left CCA. Soft and calcified plaque at the left ICA origin and bulb without stenosis to the skull base. Vertebral arteries: Proximal right subclavian artery calcified plaque resulting in right subclavian origin stenosis of 70 % with respect to the distal vessel (series 8, image 121). Superimposed bulky calcified plaque at the right vertebral artery origin but only mild stenosis. The right vertebral artery is patent to the skull base with mild V3 segment calcified plaque, no extracranial stenosis. Soft and calcified plaque in the proximal left subclavian artery without significant stenosis. Plaque near the left vertebral artery, but no origin stenosis. The left vertebral is mildly dominant and patent to the skull base without stenosis. CTA HEAD Posterior circulation: Bilateral proximal V4 segment calcified plaque. Moderate associated bilateral distal vertebral artery stenosis (series 7, image 156). Both PICA origins are patent nearby. Both vertebral arteries remain patent to the vertebrobasilar junction without additional plaque or stenosis. The basilar artery is diminutive but patent. SCA and PCA origins are patent. There is mild irregularity and stenosis at the left PCA origin on series 11, image 25. Right PCA branches are within normal limits. Mild left PCA branch irregularity.  Posterior  communicating arteries are diminutive or absent. Anterior circulation: Both ICA siphons are patent. Moderate calcified plaque on the left with moderate multifocal left ICA siphon stenosis including the cavernous and supraclinoid segments. On the right there is widespread siphon calcification and severe intermittent stenosis of the right siphon from the mid cavernous segment through the supraclinoid segment. See series 7, image 120). But both carotid termini remain patent. The left is dominant with a dominant left and diminutive or absent right ACA A1 segment. There is a dominant A2, simulating azygos ACA anatomy. ACA branches are within normal limits. Left MCA M1 and bifurcation are patent without stenosis. Left MCA branches within normal limits. Right MCA M1 and bifurcation are patent without stenosis. Right MCA branches are within normal limits. Venous sinuses: Early contrast timing, not well evaluated. Anatomic variants: Mildly dominant left vertebral artery. Dominant left and diminutive or absent right ACA A1 segments. Review of the MIP images confirms the above findings IMPRESSION: 1. Negative for large vessel occlusion. 2. But positive for multiple hemodynamically significant stenoses: - Severe Right ICA siphon stenosis due to calcified plaque. - Moderate contralateral left ICA siphon stenosis due to calcified plaque. - 70% stenosis of the right subclavian artery origin. - Moderate stenosis of both distal vertebral arteries as they cross the dura due to calcified plaque. 3. Aortic Atherosclerosis (ICD10-I70.0). 4. Right thyroid goiter. Arterial findings were communicated to Dr. Cheral Marker at 7:40 pm on 07/01/2019 by text page via the [facility] messaging system. Electronically Signed   By: Genevie Ann M.D.   On: 07/01/2019 19:41   Ct Angio Neck W Or Wo Contrast  Result Date: 07/01/2019 CLINICAL DATA:  Code stroke. 85 year old female with unexplained altered mental status and slurred speech. Last seen normal  1740 hours. EXAM: CT ANGIOGRAPHY HEAD AND NECK TECHNIQUE: Multidetector CT imaging of the head and neck was performed using the standard protocol during bolus administration of intravenous contrast. Multiplanar CT image reconstructions and MIPs were obtained to evaluate the vascular anatomy. Carotid stenosis measurements (when applicable) are obtained utilizing NASCET criteria, using the distal internal carotid diameter as the denominator. CONTRAST:  79m OMNIPAQUE IOHEXOL 350 MG/ML SOLN COMPARISON:  Plain head CT 1901 hours today. FINDINGS: CTA NECK Skeleton: Areas of poor dentition. Upper cervical spine ankylosis and degeneration. Prior sternotomy. No acute osseous abnormality identified. Upper chest: Negative visible upper lungs. No superior mediastinal lymphadenopathy. Partially visible left chest cardiac pacemaker type device. Other neck: Large right thyroid goiter with some retrosternal extension. Mild regional mass effect, no airway narrowing. Other neck soft tissues are within normal limits. Aortic arch: Calcified aortic atherosclerosis. Three vessel arch configuration. Right carotid system: Streak artifact at the thoracic inlet affecting the right CCA origin. Tortuous brachiocephalic artery and proximal right CCA, partially due to mass effect from the right thyroid goiter. No definite proximal right CCA stenosis. Calcified plaque at the right ICA origin and bulb resulting in less than 50 % stenosis with respect to the distal vessel. Left carotid system: No left CCA origin stenosis despite plaque. Mildly tortuous left CCA. Soft and calcified plaque at the left ICA origin and bulb without stenosis to the skull base. Vertebral arteries: Proximal right subclavian artery calcified plaque resulting in right subclavian origin stenosis of 70 % with respect to the distal vessel (series 8, image 121). Superimposed bulky calcified plaque at the right vertebral artery origin but only mild stenosis. The right vertebral  artery is patent to the skull base with mild V3 segment calcified plaque, no extracranial  stenosis. Soft and calcified plaque in the proximal left subclavian artery without significant stenosis. Plaque near the left vertebral artery, but no origin stenosis. The left vertebral is mildly dominant and patent to the skull base without stenosis. CTA HEAD Posterior circulation: Bilateral proximal V4 segment calcified plaque. Moderate associated bilateral distal vertebral artery stenosis (series 7, image 156). Both PICA origins are patent nearby. Both vertebral arteries remain patent to the vertebrobasilar junction without additional plaque or stenosis. The basilar artery is diminutive but patent. SCA and PCA origins are patent. There is mild irregularity and stenosis at the left PCA origin on series 11, image 25. Right PCA branches are within normal limits. Mild left PCA branch irregularity. Posterior communicating arteries are diminutive or absent. Anterior circulation: Both ICA siphons are patent. Moderate calcified plaque on the left with moderate multifocal left ICA siphon stenosis including the cavernous and supraclinoid segments. On the right there is widespread siphon calcification and severe intermittent stenosis of the right siphon from the mid cavernous segment through the supraclinoid segment. See series 7, image 120). But both carotid termini remain patent. The left is dominant with a dominant left and diminutive or absent right ACA A1 segment. There is a dominant A2, simulating azygos ACA anatomy. ACA branches are within normal limits. Left MCA M1 and bifurcation are patent without stenosis. Left MCA branches within normal limits. Right MCA M1 and bifurcation are patent without stenosis. Right MCA branches are within normal limits. Venous sinuses: Early contrast timing, not well evaluated. Anatomic variants: Mildly dominant left vertebral artery. Dominant left and diminutive or absent right ACA A1 segments.  Review of the MIP images confirms the above findings IMPRESSION: 1. Negative for large vessel occlusion. 2. But positive for multiple hemodynamically significant stenoses: - Severe Right ICA siphon stenosis due to calcified plaque. - Moderate contralateral left ICA siphon stenosis due to calcified plaque. - 70% stenosis of the right subclavian artery origin. - Moderate stenosis of both distal vertebral arteries as they cross the dura due to calcified plaque. 3. Aortic Atherosclerosis (ICD10-I70.0). 4. Right thyroid goiter. Arterial findings were communicated to Dr. Cheral Marker at 7:40 pm on 07/01/2019 by text page via the [facility] messaging system. Electronically Signed   By: Genevie Ann M.D.   On: 07/01/2019 19:41   Ct Head Code Stroke Wo Contrast  Result Date: 07/01/2019 CLINICAL DATA:  Code stroke. 85 year old female with unexplained altered mental status and slurred speech. Last seen normal 1740 hours. EXAM: CT HEAD WITHOUT CONTRAST TECHNIQUE: Contiguous axial images were obtained from the base of the skull through the vertex without intravenous contrast. COMPARISON:  Head CT 02/09/2016. FINDINGS: Brain: Stable cerebral volume since 2017. No ventriculomegaly. No midline shift, mass effect, or evidence of intracranial mass lesion. No acute intracranial hemorrhage identified. Patchy white matter hypodensity in both hemispheres appears mild for age and stable. No cortically based acute infarct identified. No cortical encephalomalacia identified. Vascular: Calcified atherosclerosis at the skull base. No suspicious intracranial vascular hyperdensity. Skull: No acute osseous abnormality identified. Sinuses/Orbits: Visualized paranasal sinuses and mastoids are stable and well pneumatized. Other: Interval postoperative changes to both globes. No acute orbit or scalp soft tissue finding. ASPECTS [facility] Stroke Program Early CT Score) Total score (0-10 with 10 being normal): 10 IMPRESSION: 1. No acute intracranial abnormality.  Stable non contrast CT appearance of the brain since 2017. 2.  ASPECTS 10. 3. These results were communicated to Drs. Aora and Lindzen at 7:19 pm on 07/01/2019 by text page via the [facility] messaging  system. Electronically Signed   By: Genevie Ann M.D.   On: 07/01/2019 19:20    Pending Labs Unresulted Labs (From admission, onward)    Start     Ordered   07/01/19 2235  SARS CORONAVIRUS 2 (TAT 6-24 HRS) Nasopharyngeal Nasopharyngeal Swab  (Asymptomatic/Tier 2 Patients Labs)  Once,   STAT    Question Answer Comment  Is this test for diagnosis or screening Screening   Symptomatic for COVID-19 as defined by CDC No   Hospitalized for COVID-19 No   Admitted to ICU for COVID-19 No   Previously tested for COVID-19 Yes   Resident in a congregate (group) care setting No   Employed in healthcare setting No   Pregnant No      07/01/19 2234   Signed and Held  Hemoglobin A1c  Tomorrow morning,   R     Signed and Held   Signed and Held  Lipid panel  Tomorrow morning,   R    Comments: Fasting    Signed and Held   Signed and Held  Hemoglobin A1c  Once,   R    Comments: To assess prior glycemic control    Signed and Held   Signed and Held  CBC  (enoxaparin (LOVENOX)    CrCl >/= 30 ml/min)  Once,   R    Comments: Baseline for enoxaparin therapy IF NOT ALREADY DRAWN.  Notify MD if PLT < 100 K.    Signed and Held   Signed and Held  Creatinine, serum  (enoxaparin (LOVENOX)    CrCl >/= 30 ml/min)  Once,   R    Comments: Baseline for enoxaparin therapy IF NOT ALREADY DRAWN.    Signed and Held   Signed and Held  Creatinine, serum  (enoxaparin (LOVENOX)    CrCl >/= 30 ml/min)  Weekly,   R    Comments: while on enoxaparin therapy    Signed and Held   Signed and Held  Comprehensive metabolic panel  Tomorrow morning,   R     Signed and Held   Signed and Held  CBC  Tomorrow morning,   R     Signed and Held          Vitals/Pain Today's Vitals   07/01/19 1930 07/01/19 2330 07/02/19 0100 07/02/19 0130  BP:  (!) 149/62 (!) 111/53 134/68 123/61  Pulse: 69 70 74 76  Resp: 17 (!) 21  (!) 0  SpO2: 97% 97% 97% 97%  PainSc: 0-No pain       Isolation Precautions No active isolations  Medications Medications  sodium chloride flush (NS) 0.9 % injection 3 mL (3 mLs Intravenous Not Given 07/01/19 1940)  iohexol (OMNIPAQUE) 350 MG/ML injection 60 mL (60 mLs Intravenous Contrast Given 07/01/19 1909)    Mobility walks with device     Focused Assessments Neuro Assessment Handoff:  Swallow screen pass? Yes    NIH Stroke Scale ( + Modified Stroke Scale Criteria)  Interval: Initial Level of Consciousness (1a.)   : Alert, keenly responsive LOC Questions (1b. )   +: Answers both questions correctly LOC Commands (1c. )   + : Performs both tasks correctly Best Gaze (2. )  +: Normal Visual (3. )  +: No visual loss Facial Palsy (4. )    : Normal symmetrical movements Motor Arm, Left (5a. )   +: No drift Motor Arm, Right (5b. )   +: No drift Motor Leg, Left (6a. )   +: No drift  Motor Leg, Right (6b. )   +: No drift Limb Ataxia (7. ): Absent Sensory (8. )   +: Normal, no sensory loss Best Language (9. )   +: No aphasia Dysarthria (10. ): Normal Extinction/Inattention (11.)   +: No Abnormality Modified SS Total  +: 0 Complete NIHSS TOTAL: 1 Last date known well: 07/01/19 Last time known well: 1740 Neuro Assessment: Exceptions to WDL Neuro Checks:   Initial (07/01/19 1847)  Last Documented NIHSS Modified Score: 0 (07/02/19 0109) Has TPA been given? No If patient is a Neuro Trauma and patient is going to OR before floor call report to [place] nurse: [phone] or [phone]     R Recommendations: See Admitting Provider Note  Report given to:   Additional Notes: N/A
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
## 2019-07-02 NOTE — Progress Notes (Signed)
Called to vascular department to find out if able to do carotid US on patient.No answer left message.

## 2019-07-02 NOTE — ED Notes (Signed)
Number for Report: [phone]

## 2019-07-02 NOTE — Progress Notes (Signed)
  Echocardiogram 2D Echocardiogram has been performed.  [name] 07/02/2019, 9:22 AM
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
## 2019-07-02 NOTE — H&P (Addendum)
History and Physical    [name] TMA:[id number] DOB: 1928-09-15 DOA: 07/01/2019  PCP: [name], MD  Patient coming from: Home.  History obtained from patient's daughter.  Chief Complaint: Slurred speech and confusion.  HPI: [name] is a 85 y.o. female with history of CAD status post CABG and stenting, ischemic cardiomyopathy last EF measured in 2014 was 40-45 present, complete heart block status post pacemaker recent pacemaker generator change 2 days ago was brought to the ER after patient's daughter found the patient was having difficulty speaking and appeared confused.  EMS was called.  Patient symptoms appeared around 5:40 PM yesterday.  EMS found patient to be confused and was brought to the ER.  ED Course: By the time patient reached ER patient symptoms have improved and resolved.  Patient was able to move all extremities without any difficulty.  CT head followed by CT angiogram of the head and neck was done.  Did not show any large vessel obstruction but did show multiple vascular lesions.  Neurologist on-call was consulted and patient admitted for further management.  Patient has passed swallow.  Labs show creatinine has worsened from 1.4 in September 9 and presently is 2.1.  Hemoglobin is around baseline 10.  EKG shows paced rhythm.  Patient admitted for TIA.  Review of Systems: As per HPI, rest all negative.   Past Medical History:  Diagnosis Date  . Anemia   . Arthritis   . Celiac disease   . Chronic systolic CHF (congestive heart failure) ([place])   . Complete heart block ([place])   . Coronary artery disease   . Diabetes mellitus    INSULIN DEPENDENT  . GERD (gastroesophageal reflux disease)   . Glaucoma   . Headache(784.0)   . Heart disease   . Hypercholesterolemia   . Hypertension   . Iron deficiency anemia, unspecified   . Ischemic cardiomyopathy   . Memory difficulties 04/14/2019  . Shortness of breath   . Sleep apnea    uses cpap  . Stroke  ([place])   . Unspecified constipation     Past Surgical History:  Procedure Laterality Date  . BACK SURGERY    . BI-VENTRICULAR PACEMAKER INSERTION (CRT-P)  March 2014   [facility]  . BIV PACEMAKER GENERATOR CHANGEOUT N/A 06/29/2019   Procedure: BIV PACEMAKER GENERATOR CHANGEOUT;  Surgeon: Evans Lance, MD;  Location: [facility];  Service: Cardiovascular;  Laterality: N/A;  . CARDIAC CATHETERIZATION  09/02/2012  . CARDIAC SURGERY    . CORONARY ANGIOPLASTY WITH STENT PLACEMENT  09/02/2012   RCA  . PERCUTANEOUS CORONARY STENT INTERVENTION (PCI-S) N/A 09/02/2012   Procedure: PERCUTANEOUS CORONARY STENT INTERVENTION (PCI-S);  Surgeon: Clent Demark, MD;  Location: [facility] CATH LAB;  Service: Cardiovascular;  Laterality: N/A;  . PERMANENT PACEMAKER INSERTION N/A 11/29/2012   Procedure: PERMANENT PACEMAKER INSERTION;  Surgeon: Deboraha Sprang, MD;  Location: [facility] CATH LAB;  Service: Cardiovascular;  Laterality: N/A;  . TEMPORARY PACEMAKER INSERTION N/A 11/28/2012   Procedure: TEMPORARY PACEMAKER INSERTION;  Surgeon: Clent Demark, MD;  Location: [facility] CATH LAB;  Service: Cardiovascular;  Laterality: N/A;     reports that she quit smoking about 42 years ago. She has never used smokeless tobacco. She reports that she does not drink alcohol or use drugs.  No Known Allergies  Family History  Problem Relation Age of Onset  . Diabetes Mother   . Hypertension Mother   . Hyperlipidemia Mother   . Cancer Sister   .  Dementia Sister   . Neuropathy Sister     Prior to Admission medications   Medication Sig Start Date End Date Taking? Authorizing Provider  amLODipine (NORVASC) 2.5 MG tablet Take 2.5 mg by mouth every morning.   Yes [provider]  aspirin EC 81 MG tablet Take 81 mg by mouth every morning.    Yes [provider]  atorvastatin (LIPITOR) 80 MG tablet Take 80 mg by mouth every morning.  04/17/15  Yes [provider]  brimonidine-timolol (COMBIGAN)  0.2-0.5 % ophthalmic solution Place 1 drop into both eyes 2 (two) times daily.   Yes [provider]  calcitRIOL (ROCALTROL) 0.25 MCG capsule Take 0.25 mcg by mouth every morning.   Yes [provider]  carvedilol (COREG) 3.125 MG tablet Take 3.125 mg by mouth 2 (two) times daily. 04/17/15  Yes [provider]  Cod Liver Oil CAPS Take 1 capsule by mouth every morning.   Yes [provider]  donepezil (ARICEPT) 10 MG tablet Take 10 mg by mouth at bedtime.   Yes [provider]  dorzolamide (TRUSOPT) 2 % ophthalmic solution Place 1 drop into both eyes 2 (two) times daily.  07/28/13  Yes [provider]  insulin degludec (TRESIBA FLEXTOUCH) 100 UNIT/ML SOPN FlexTouch Pen Inject 10-20 Units into the skin at bedtime. Based on CBG   Yes [provider]  Insulin Lispro (HUMALOG KWIKPEN) 200 UNIT/ML SOPN Inject 6 Units into the skin 3 (three) times daily as needed (high blood sugar).    Yes [provider]  latanoprost (XALATAN) 0.005 % ophthalmic solution Appointment OVERDUE, place 1 drop into both eyes daily at bedtime Patient taking differently: Place 1 drop into both eyes at bedtime.  03/02/14  Yes Blanchie Serve, MD  linagliptin (TRADJENTA) 5 MG TABS tablet Take 5 mg by mouth every morning.   Yes [provider]  loperamide (IMODIUM) 2 MG capsule Take 2 mg by mouth as needed for diarrhea or loose stools.   Yes [provider]  Multiple Vitamin (MULTIVITAMIN WITH MINERALS) TABS Take 1 tablet by mouth every morning.    Yes [provider]  Multiple Vitamins-Minerals (EYE VITAMINS) CAPS Take 1 capsule by mouth every morning.   Yes [provider]  nitroGLYCERIN (NITROSTAT) 0.4 MG SL tablet Place 0.4 mg under the tongue every 5 (five) minutes as needed for chest pain.  09/03/12  Yes Charolette Forward, MD  pantoprazole (PROTONIX) 40 MG tablet Take 40 mg by mouth every morning.  05/07/15  Yes [provider]  polyethylene glycol powder (GLYCOLAX/MIRALAX) powder TAKE 17 G BY MOUTH DAILY. HOLD FOR LOOSE STOOL Patient taking differently: Take 17 g by mouth daily as needed (constipation).  06/01/14  Yes Blanchie Serve, MD  valsartan-hydrochlorothiazide (DIOVAN-HCT) 320-12.5 MG tablet Take 1 tablet by mouth every morning.    Yes [provider]  BAYER CONTOUR TEST test strip  01/24/13   [provider]  carvedilol (COREG) 6.25 MG tablet Take 1 tablet (6.25 mg total) by mouth 2 (two) times daily. Patient not taking: Reported on 07/01/2019 01/09/14   Deboraha Sprang, MD  docusate sodium 100 MG CAPS Take 100 mg by mouth 2 (two) times daily. Patient not taking: Reported on 07/01/2019 10/22/12   Annita Brod, MD  donepezil (ARICEPT) 5 MG tablet TAKE 1 TABLET BY MOUTH EVERYDAY AT BEDTIME Patient not taking: Reported on 07/01/2019 05/10/19   Kathrynn Ducking, MD  lubiprostone (AMITIZA) 24 MCG capsule Take 1 capsule (  24 mcg total) by mouth 2 (two) times daily with a meal. Patient not taking: Reported on 07/01/2019 06/08/13   Blanchie Serve, MD  NOVOFINE 32G X 6 MM MISC  01/25/13   [provider]    Physical Exam: Constitutional: Moderately built and nourished. Vitals:   07/01/19 1930 07/01/19 2330  BP: (!) 149/62 (!) 111/53  Pulse: 69 70  Resp: 17 (!) 21  SpO2: 97% 97%   Eyes: Anicteric no pallor. ENMT: No discharge from the ears eyes nose or mouth. Neck: No mass felt.  No neck rigidity. Respiratory: No rhonchi or crepitations. Cardiovascular: S1-S2 heard. Abdomen: Soft nontender bowel sounds present. Musculoskeletal: No edema. Skin: No rash. Neurologic: Alert awake oriented to her name and place moves all extremities 5 x 5 no facial asymmetry tongue is midline.  Pupils equal reacting to light. Psychiatric: Appears normal.  Normal affect.   Labs on Admission: I have personally reviewed following labs and imaging studies  CBC: Recent Labs  Lab 06/29/19 0759  07/01/19 1852 07/01/19 1857  WBC 7.6 7.9  --   NEUTROABS  --  3.5  --   HGB 11.4* 10.8* 11.6*  HCT 35.7* 34.2* 34.0*  MCV 91.1 91.9  --   PLT 198 196  --    Basic Metabolic Panel: Recent Labs  Lab 06/29/19 0759 07/01/19 1852 07/01/19 1857  NA 138 137 137  K 4.0 4.1 3.9  CL 104 104 104  CO2 27 25  --   GLUCOSE 114* 131* 121*  BUN 20 23 23   CREATININE 1.42* 2.10* 2.00*  CALCIUM 11.5* 10.8*  --    GFR: Estimated Creatinine Clearance: 19.4 mL/min (A) (by C-G formula based on SCr of 2 mg/dL (H)). Liver Function Tests: Recent Labs  Lab 07/01/19 1852  AST 24  ALT 23  ALKPHOS 81  BILITOT 0.4  PROT 6.8  ALBUMIN 3.6   No results for input(s): LIPASE, AMYLASE in the last 168 hours. No results for input(s): AMMONIA in the last 168 hours. Coagulation Profile: Recent Labs  Lab 07/01/19 1852  INR 1.0   Cardiac Enzymes: No results for input(s): CKTOTAL, CKMB, CKMBINDEX, TROPONINI in the last 168 hours. BNP (last 3 results) No results for input(s): PROBNP in the last 8760 hours. HbA1C: No results for input(s): HGBA1C in the last 72 hours. CBG: Recent Labs  Lab 06/29/19 0815  GLUCAP 102*   Lipid Profile: No results for input(s): CHOL, HDL, LDLCALC, TRIG, CHOLHDL, LDLDIRECT in the last 72 hours. Thyroid Function Tests: No results for input(s): TSH, T4TOTAL, FREET4, T3FREE, THYROIDAB in the last 72 hours. Anemia Panel: No results for input(s): VITAMINB12, FOLATE, FERRITIN, TIBC, IRON, RETICCTPCT in the last 72 hours. Urine analysis:    Component Value Date/Time   COLORURINE STRAW (A) 07/01/2019 2044   APPEARANCEUR CLEAR 07/01/2019 2044   LABSPEC 1.010 07/01/2019 2044   PHURINE 6.0 07/01/2019 2044   GLUCOSEU NEGATIVE 07/01/2019 2044   HGBUR NEGATIVE 07/01/2019 2044   BILIRUBINUR NEGATIVE 07/01/2019 2044   KETONESUR NEGATIVE 07/01/2019 2044   PROTEINUR NEGATIVE 07/01/2019 2044   UROBILINOGEN 0.2 08/07/2015 1826   NITRITE NEGATIVE 07/01/2019 2044   LEUKOCYTESUR  NEGATIVE 07/01/2019 2044   Sepsis Labs: @LABRCNTIP (procalcitonin:4,lacticidven:4) ) Recent Results (from the past 240 hour(s))  Novel Coronavirus, NAA (Hosp order, Send-out to Ref Lab; TAT 18-24 hrs     Status: None   Collection Time: 06/24/19 11:13 AM   Specimen: Nasopharyngeal Swab; Respiratory  Result Value Ref Range Status   SARS-CoV-2, NAA NOT  DETECTED NOT DETECTED Final    Comment: (NOTE) Testing was performed using the cobas(R) SARS-CoV-2 test. This nucleic acid amplification test was developed and its performance characteristics determined by [employer]. Nucleic acid amplification tests include PCR and TMA. This test has not been FDA cleared or approved. This test has been authorized by FDA under an Emergency Use Authorization (EUA). This test is only authorized for the duration of time the declaration that circumstances exist justifying the authorization of the emergency use of in vitro diagnostic tests for detection of SARS-CoV-2 virus and/or diagnosis of COVID-19 infection under section 564(b)(1) of the Act, 21 U.S.C. 546EVO-3(J) (1), unless the authorization is terminated or revoked sooner. When diagnostic testing is negative, the possibility of a false negative result should be considered in the context of a patient's recent exposures and the presence of clinical signs and symptoms consistent with COVID-19. An individual without s ymptoms of COVID- 19 and who is not shedding SARS-CoV-2 virus would expect to have a negative (not detected) result in this assay. Performed At: [facility] [address] [place], [place] [id number] Rush Farmer MD HB:[id number]    [place]  Final    Comment: Performed at [facility], [place] [address] [place], [place] [address]  Surgical PCR screen     Status: None   Collection Time: 06/29/19  8:21 AM   Specimen: Nasal Mucosa; Nasal Swab  Result Value Ref Range Status   MRSA, PCR  NEGATIVE NEGATIVE Final   Staphylococcus aureus NEGATIVE NEGATIVE Final    Comment: (NOTE) The Xpert SA Assay (FDA approved for NASAL specimens in patients 7 years of age and older), is one component of a comprehensive surveillance program. It is not intended to diagnose infection nor to guide or monitor treatment. Performed at [facility], [place] [address] [place], [place] [address]      Radiological Exams on Admission: Ct Angio Head W Or Wo Contrast  Result Date: 07/01/2019 CLINICAL DATA:  Code stroke. 85 year old female with unexplained altered mental status and slurred speech. Last seen normal 1740 hours. EXAM: CT ANGIOGRAPHY HEAD AND NECK TECHNIQUE: Multidetector CT imaging of the head and neck was performed using the standard protocol during bolus administration of intravenous contrast. Multiplanar CT image reconstructions and MIPs were obtained to evaluate the vascular anatomy. Carotid stenosis measurements (when applicable) are obtained utilizing NASCET criteria, using the distal internal carotid diameter as the denominator. CONTRAST:  61m OMNIPAQUE IOHEXOL 350 MG/ML SOLN COMPARISON:  Plain head CT 1901 hours today. FINDINGS: CTA NECK Skeleton: Areas of poor dentition. Upper cervical spine ankylosis and degeneration. Prior sternotomy. No acute osseous abnormality identified. Upper chest: Negative visible upper lungs. No superior mediastinal lymphadenopathy. Partially visible left chest cardiac pacemaker type device. Other neck: Large right thyroid goiter with some retrosternal extension. Mild regional mass effect, no airway narrowing. Other neck soft tissues are within normal limits. Aortic arch: Calcified aortic atherosclerosis. Three vessel arch configuration. Right carotid system: Streak artifact at the thoracic inlet affecting the right CCA origin. Tortuous brachiocephalic artery and proximal right CCA, partially due to mass effect from the right thyroid goiter. No definite  proximal right CCA stenosis. Calcified plaque at the right ICA origin and bulb resulting in less than 50 % stenosis with respect to the distal vessel. Left carotid system: No left CCA origin stenosis despite plaque. Mildly tortuous left CCA. Soft and calcified plaque at the left ICA origin and bulb without stenosis to the skull base. Vertebral  arteries: Proximal right subclavian artery calcified plaque resulting in right subclavian origin stenosis of 70 % with respect to the distal vessel (series 8, image 121). Superimposed bulky calcified plaque at the right vertebral artery origin but only mild stenosis. The right vertebral artery is patent to the skull base with mild V3 segment calcified plaque, no extracranial stenosis. Soft and calcified plaque in the proximal left subclavian artery without significant stenosis. Plaque near the left vertebral artery, but no origin stenosis. The left vertebral is mildly dominant and patent to the skull base without stenosis. CTA HEAD Posterior circulation: Bilateral proximal V4 segment calcified plaque. Moderate associated bilateral distal vertebral artery stenosis (series 7, image 156). Both PICA origins are patent nearby. Both vertebral arteries remain patent to the vertebrobasilar junction without additional plaque or stenosis. The basilar artery is diminutive but patent. SCA and PCA origins are patent. There is mild irregularity and stenosis at the left PCA origin on series 11, image 25. Right PCA branches are within normal limits. Mild left PCA branch irregularity. Posterior communicating arteries are diminutive or absent. Anterior circulation: Both ICA siphons are patent. Moderate calcified plaque on the left with moderate multifocal left ICA siphon stenosis including the cavernous and supraclinoid segments. On the right there is widespread siphon calcification and severe intermittent stenosis of the right siphon from the mid cavernous segment through the supraclinoid  segment. See series 7, image 120). But both carotid termini remain patent. The left is dominant with a dominant left and diminutive or absent right ACA A1 segment. There is a dominant A2, simulating azygos ACA anatomy. ACA branches are within normal limits. Left MCA M1 and bifurcation are patent without stenosis. Left MCA branches within normal limits. Right MCA M1 and bifurcation are patent without stenosis. Right MCA branches are within normal limits. Venous sinuses: Early contrast timing, not well evaluated. Anatomic variants: Mildly dominant left vertebral artery. Dominant left and diminutive or absent right ACA A1 segments. Review of the MIP images confirms the above findings IMPRESSION: 1. Negative for large vessel occlusion. 2. But positive for multiple hemodynamically significant stenoses: - Severe Right ICA siphon stenosis due to calcified plaque. - Moderate contralateral left ICA siphon stenosis due to calcified plaque. - 70% stenosis of the right subclavian artery origin. - Moderate stenosis of both distal vertebral arteries as they cross the dura due to calcified plaque. 3. Aortic Atherosclerosis (ICD10-I70.0). 4. Right thyroid goiter. Arterial findings were communicated to Dr. Cheral Marker at 7:40 pm on 07/01/2019 by text page via the [facility] messaging system. Electronically Signed   By: Genevie Ann M.D.   On: 07/01/2019 19:41   Ct Angio Neck W Or Wo Contrast  Result Date: 07/01/2019 CLINICAL DATA:  Code stroke. 85 year old female with unexplained altered mental status and slurred speech. Last seen normal 1740 hours. EXAM: CT ANGIOGRAPHY HEAD AND NECK TECHNIQUE: Multidetector CT imaging of the head and neck was performed using the standard protocol during bolus administration of intravenous contrast. Multiplanar CT image reconstructions and MIPs were obtained to evaluate the vascular anatomy. Carotid stenosis measurements (when applicable) are obtained utilizing NASCET criteria, using the distal internal  carotid diameter as the denominator. CONTRAST:  8m OMNIPAQUE IOHEXOL 350 MG/ML SOLN COMPARISON:  Plain head CT 1901 hours today. FINDINGS: CTA NECK Skeleton: Areas of poor dentition. Upper cervical spine ankylosis and degeneration. Prior sternotomy. No acute osseous abnormality identified. Upper chest: Negative visible upper lungs. No superior mediastinal lymphadenopathy. Partially visible left chest cardiac pacemaker type device. Other neck: Large right  thyroid goiter with some retrosternal extension. Mild regional mass effect, no airway narrowing. Other neck soft tissues are within normal limits. Aortic arch: Calcified aortic atherosclerosis. Three vessel arch configuration. Right carotid system: Streak artifact at the thoracic inlet affecting the right CCA origin. Tortuous brachiocephalic artery and proximal right CCA, partially due to mass effect from the right thyroid goiter. No definite proximal right CCA stenosis. Calcified plaque at the right ICA origin and bulb resulting in less than 50 % stenosis with respect to the distal vessel. Left carotid system: No left CCA origin stenosis despite plaque. Mildly tortuous left CCA. Soft and calcified plaque at the left ICA origin and bulb without stenosis to the skull base. Vertebral arteries: Proximal right subclavian artery calcified plaque resulting in right subclavian origin stenosis of 70 % with respect to the distal vessel (series 8, image 121). Superimposed bulky calcified plaque at the right vertebral artery origin but only mild stenosis. The right vertebral artery is patent to the skull base with mild V3 segment calcified plaque, no extracranial stenosis. Soft and calcified plaque in the proximal left subclavian artery without significant stenosis. Plaque near the left vertebral artery, but no origin stenosis. The left vertebral is mildly dominant and patent to the skull base without stenosis. CTA HEAD Posterior circulation: Bilateral proximal V4 segment  calcified plaque. Moderate associated bilateral distal vertebral artery stenosis (series 7, image 156). Both PICA origins are patent nearby. Both vertebral arteries remain patent to the vertebrobasilar junction without additional plaque or stenosis. The basilar artery is diminutive but patent. SCA and PCA origins are patent. There is mild irregularity and stenosis at the left PCA origin on series 11, image 25. Right PCA branches are within normal limits. Mild left PCA branch irregularity. Posterior communicating arteries are diminutive or absent. Anterior circulation: Both ICA siphons are patent. Moderate calcified plaque on the left with moderate multifocal left ICA siphon stenosis including the cavernous and supraclinoid segments. On the right there is widespread siphon calcification and severe intermittent stenosis of the right siphon from the mid cavernous segment through the supraclinoid segment. See series 7, image 120). But both carotid termini remain patent. The left is dominant with a dominant left and diminutive or absent right ACA A1 segment. There is a dominant A2, simulating azygos ACA anatomy. ACA branches are within normal limits. Left MCA M1 and bifurcation are patent without stenosis. Left MCA branches within normal limits. Right MCA M1 and bifurcation are patent without stenosis. Right MCA branches are within normal limits. Venous sinuses: Early contrast timing, not well evaluated. Anatomic variants: Mildly dominant left vertebral artery. Dominant left and diminutive or absent right ACA A1 segments. Review of the MIP images confirms the above findings IMPRESSION: 1. Negative for large vessel occlusion. 2. But positive for multiple hemodynamically significant stenoses: - Severe Right ICA siphon stenosis due to calcified plaque. - Moderate contralateral left ICA siphon stenosis due to calcified plaque. - 70% stenosis of the right subclavian artery origin. - Moderate stenosis of both distal vertebral  arteries as they cross the dura due to calcified plaque. 3. Aortic Atherosclerosis (ICD10-I70.0). 4. Right thyroid goiter. Arterial findings were communicated to Dr. Cheral Marker at 7:40 pm on 07/01/2019 by text page via the [facility] messaging system. Electronically Signed   By: Genevie Ann M.D.   On: 07/01/2019 19:41   Ct Head Code Stroke Wo Contrast  Result Date: 07/01/2019 CLINICAL DATA:  Code stroke. 85 year old female with unexplained altered mental status and slurred speech. Last seen normal  1740 hours. EXAM: CT HEAD WITHOUT CONTRAST TECHNIQUE: Contiguous axial images were obtained from the base of the skull through the vertex without intravenous contrast. COMPARISON:  Head CT 02/09/2016. FINDINGS: Brain: Stable cerebral volume since 2017. No ventriculomegaly. No midline shift, mass effect, or evidence of intracranial mass lesion. No acute intracranial hemorrhage identified. Patchy white matter hypodensity in both hemispheres appears mild for age and stable. No cortically based acute infarct identified. No cortical encephalomalacia identified. Vascular: Calcified atherosclerosis at the skull base. No suspicious intracranial vascular hyperdensity. Skull: No acute osseous abnormality identified. Sinuses/Orbits: Visualized paranasal sinuses and mastoids are stable and well pneumatized. Other: Interval postoperative changes to both globes. No acute orbit or scalp soft tissue finding. ASPECTS [facility] Stroke Program Early CT Score) Total score (0-10 with 10 being normal): 10 IMPRESSION: 1. No acute intracranial abnormality. Stable non contrast CT appearance of the brain since 2017. 2.  ASPECTS 10. 3. These results were communicated to Drs. Aora and Lindzen at 7:19 pm on 07/01/2019 by text page via the [facility] messaging system. Electronically Signed   By: Genevie Ann M.D.   On: 07/01/2019 19:20    EKG: Independently reviewed.  Paced rhythm.  Assessment/Plan Principal Problem:   TIA (transient ischemic attack) Active  Problems:   Hypertension   Type 2 diabetes mellitus ([place])    1. TIA -appreciate neurology consult.  Patient has passed swallow.  At this time neurology recommended to allow for permissive hypertension, to continue double antiplatelet agents Plavix and aspirin check hemoglobin A1c lipid panel neurochecks continue Lipitor 80 mg physical therapy consult. 2. Acute on chronic disease stage III -will hold ARB and gently hydrate.  Follow metabolic panel closely. 3. Hypertension holding ARB due to acute on chronic kidney disease.  We will keep patient on PRN IV hydralazine for systolic more than 156 diastolic more than 153 to allow permissive hypertension.  Continue amlodipine and beta-blocker. 4. History of CAD denies any chest pain.  On beta-blocker dual antiplatelet agents and statins. 5. History of chronic systolic heart failure last EF measured was 40-45 present appears euvolemic.  Receiving mild fluid due to renal failure.  Holding ARB due to worsening renal function. 6. Anemia likely from chronic and disease appears to be at baseline follow CBC. 7. History of complete heart block status post pacemaker placement recent generator replacement. 8. Diabetes mellitus type 2 on Lantus insulin at bedtime.  On sliding scale coverage.  Checking hemoglobin A1c. 9. Mild hypercalcemia recheck metabolic panel after hydration.  There is some mention of paroxysmal atrial fibrillation and cardiology notes.  Will need to verify with them and patient may need to be on anticoagulation based on this.   DVT prophylaxis: Lovenox. Code Status: Full code. Family Communication: Patient's daughter. Disposition Plan: Home. Consults called: Neurology. Admission status: Observation.   [name] MD [facility] Pager [phone].  If 7PM-7AM, please contact night-coverage [email or web address] Password [facility]  07/02/2019, 12:13 AM

## 2019-07-02 NOTE — Discharge Summary (Signed)
Vicki VILLASENOR JYN:[id number] DOB: [birth year] DOA: 07/01/2019  PCP: [name], MD  Admit date: 07/01/2019  Discharge date: 07/02/2019  Admitted From: Home   Disposition:  Home   Recommendations for Outpatient Follow-up:   Follow up with PCP in 1-2 weeks  PCP Please obtain BMP/CBC, 2 view CXR in 1week,  (see Discharge instructions)   PCP Please follow up on the following pending results: needs outpt Endocrine, Vas.Surgery follow up   Home Health: PT,OT  Equipment/Devices:  3 in 1 Consultations: Neuro Discharge Condition: Stable    CODE STATUS: Full    Diet Recommendation: Heart Healthy Low Carb  Diet Order            Diet heart healthy/carb modified Room service appropriate? Yes; Fluid consistency: Thin  Diet effective now               Chief Complaint  Patient presents with  . Code Stroke     Brief history of present illness from the day of admission and additional interim summary    Vicki J Hubbardis a 85 y.o.femalewithhistory of CAD status post CABG and stenting, ischemic cardiomyopathy last EF measured in 2014 was 40-45 present, complete heart block status post pacemaker recent pacemaker generator change 2 days ago was brought to the ER after patient's daughter found the patient was having difficulty speaking and appeared confused.  In the ER head CT was nonacute, CT angiogram did show some large vessel occlusion.  Neurology was consulted and she was admitted for further TIA work-up.                                                                 Hospital Course    1.  Possible TIA.  Pacemaker precludes MRI being done.  CT and CT angiogram findings below, neuro on board, currently symptom-free and close to baseline, on ASA - Statin, will add niacin for better LDL control, A1c is elevated  and will defer to PCP for further outpatient glycemic control. Per Neuro Dual antiplatelet Rx x 3 weeks then Palvix only.  TTE stable, pacemaker interrogation was stable. Carotid US noted with Non Acute changes per Tech Prelim report - outpt [facility] follow up.  2. Incidental finding of right-sided thyroid goiter.  Outpatient  PCP follow-up.  3.  Dyslipidemia.  LDL above goal.  Add niacin to full dose statin.  4.  GERD.  On PPI.  5.  Hypertension.  Resumed home Meds from tomorrow .  6.  Mild early dementia.  On home medications.  7. DM type II.  In poor outpatient control due to hyperglycemia.  Lantus and sliding scale outpatient follow-up with PCP for further glycemic control.  Lab Results  Component Value Date   HGBA1C 7.6 (H) 07/02/2019     Discharge diagnosis  Principal Problem:   TIA (transient ischemic attack) Active Problems:   Hypertension   Type 2 diabetes mellitus [facility])    Discharge instructions    Discharge Instructions    Discharge instructions   Complete by: As directed    Follow with Primary MD [name], MD in 7 days   Get CBC, CMP, 2 view Chest X ray -  checked next visit within 1 week by Primary MD    Activity: As tolerated with Full fall precautions use walker/cane & assistance as needed  Disposition Home   Diet: Heart Healthy - Low Carb.  Accuchecks 4 times/day, Once in AM empty stomach and then before each meal. Log in all results and show them to your Prim.MD in 3 days. If any glucose reading is under 80 or above 300 call your Prim MD immidiately. Follow Low glucose instructions for glucose under 80 as instructed.   Special Instructions: If you have smoked or chewed Tobacco  in the last 2 yrs please stop smoking, stop any regular Alcohol  and or any Recreational drug use.  On your next visit with your primary care physician please Get Medicines reviewed and adjusted.  Please request your Prim.MD to go over all Hospital Tests and  Procedure/Radiological results at the follow up, please get all Hospital records sent to your Prim MD by signing hospital release before you go home.  If you experience worsening of your admission symptoms, develop shortness of breath, life threatening emergency, suicidal or homicidal thoughts you must seek medical attention immediately by calling 911 or calling your MD immediately  if symptoms less severe.  You Must read complete instructions/literature along with all the possible adverse reactions/side effects for all the Medicines you take and that have been prescribed to you. Take any new Medicines after you have completely understood and accpet all the possible adverse reactions/side effects.   Increase activity slowly   Complete by: As directed       Discharge Medications   Allergies as of 07/02/2019   No Known Allergies     Medication List    TAKE these medications   amLODipine 2.5 MG tablet Commonly known as: NORVASC Take 2.5 mg by mouth every morning.   aspirin EC 81 MG tablet Take 1 tablet (81 mg total) by mouth every morning for 21 days. Stop after 3 weeks and continue Plavix only What changed: additional instructions   atorvastatin 80 MG tablet Commonly known as: LIPITOR Take 80 mg by mouth every morning.   Bayer Contour Test test strip Generic drug: glucose blood   calcitRIOL 0.25 MCG capsule Commonly known as: ROCALTROL Take 0.25 mcg by mouth every morning.   carvedilol 6.25 MG tablet Commonly known as: COREG Take 1 tablet (6.25 mg total) by mouth 2 (two) times daily.   carvedilol 3.125 MG tablet Commonly known as: COREG Take 3.125 mg by mouth 2 (two) times daily.   clopidogrel 75 MG tablet Commonly known as: PLAVIX Take 1 tablet (75 mg total) by mouth daily. Start taking on: July 03, 2019   [facility] Liver Oil Caps Take 1 capsule by mouth every morning.   Combigan 0.2-0.5 % ophthalmic solution Generic drug: brimonidine-timolol Place 1 drop into  both eyes 2 (two) times daily.   donepezil 10 MG tablet Commonly known as: ARICEPT Take 10 mg by mouth at bedtime.   donepezil 5 MG tablet Commonly known as: ARICEPT TAKE 1 TABLET BY MOUTH EVERYDAY AT BEDTIME   dorzolamide 2 % ophthalmic solution Commonly  known as: TRUSOPT Place 1 drop into both eyes 2 (two) times daily.   DSS 100 MG Caps Take 100 mg by mouth 2 (two) times daily.   Eye Vitamins Caps Take 1 capsule by mouth every morning.   HumaLOG KwikPen 200 UNIT/ML Sopn Generic drug: Insulin Lispro Inject 6 Units into the skin 3 (three) times daily as needed (high blood sugar).   latanoprost 0.005 % ophthalmic solution Commonly known as: XALATAN Appointment OVERDUE, place 1 drop into both eyes daily at bedtime What changed:   how much to take  how to take this  when to take this  additional instructions   linagliptin 5 MG Tabs tablet Commonly known as: TRADJENTA Take 5 mg by mouth every morning.   loperamide 2 MG capsule Commonly known as: IMODIUM Take 2 mg by mouth as needed for diarrhea or loose stools.   lubiprostone 24 MCG capsule Commonly known as: Amitiza Take 1 capsule (24 mcg total) by mouth 2 (two) times daily with a meal.   multivitamin with minerals Tabs tablet Take 1 tablet by mouth every morning.   niacin 100 MG tablet Take 1 tablet (100 mg total) by mouth at bedtime.   nitroGLYCERIN 0.4 MG SL tablet Commonly known as: NITROSTAT Place 0.4 mg under the tongue every 5 (five) minutes as needed for chest pain.   NovoFine 32G X 6 MM Misc Generic drug: Insulin Pen Needle   pantoprazole 40 MG tablet Commonly known as: PROTONIX Take 40 mg by mouth every morning.   polyethylene glycol powder 17 GM/SCOOP powder Commonly known as: GLYCOLAX/MIRALAX TAKE 17 G BY MOUTH DAILY. HOLD FOR LOOSE STOOL What changed: See the new instructions.   Tyler Aas FlexTouch 100 UNIT/ML Sopn FlexTouch Pen Generic drug: insulin degludec Inject 10-20 Units into the  skin at bedtime. Based on CBG   valsartan-hydrochlorothiazide 320-12.5 MG tablet Commonly known as: DIOVAN-HCT Take 1 tablet by mouth every morning.       Follow-up Information    [name], MD. Schedule an appointment as soon as possible for a visit in 1 week(s).   Specialty: Internal Medicine Contact information: [address] [place] [address] [phone]        Garvin Fila, MD. Schedule an appointment as soon as possible for a visit in 1 month(s).   Specialties: Neurology, Radiology Contact information: [address] [place] [address] [phone]        Waynetta Sandy, MD. Schedule an appointment as soon as possible for a visit in 1 week(s).   Specialties: Vascular Surgery, Cardiology Contact information: [address] [place] [address] [phone]           Major procedures and Radiology Reports - PLEASE review detailed and final reports thoroughly  -      Pacemaker Interrogation - No Afib  TEE   1. The left ventricle has normal systolic function with an ejection fraction of 60-65%. The cavity size was normal. There is mildly increased left ventricular wall thickness. Left ventricular diastolic Doppler parameters are consistent with impaired  relaxation.  2. The right ventricle has normal systolic function. The cavity was normal. There is no increase in right ventricular wall thickness.  3. The mitral valve is degenerative. Mild thickening of the mitral valve leaflet. Mild calcification of the mitral valve leaflet. There is moderate mitral annular calcification present.  4. The aortic valve is tricuspid. Severely thickening of the aortic valve. Severe calcifcation of the aortic valve.  5. The aorta  is normal unless otherwise noted.  6. No intracardiac thrombi or masses were visualized.  Carotid US - Non Acute - Prelim report.    Ct Angio Head W Or Wo Contrast  Result Date: 07/01/2019 CLINICAL DATA:  Code  stroke. 85 year old female with unexplained altered mental status and slurred speech. Last seen normal 1740 hours. EXAM: CT ANGIOGRAPHY HEAD AND NECK TECHNIQUE: Multidetector CT imaging of the head and neck was performed using the standard protocol during bolus administration of intravenous contrast. Multiplanar CT image reconstructions and MIPs were obtained to evaluate the vascular anatomy. Carotid stenosis measurements (when applicable) are obtained utilizing NASCET criteria, using the distal internal carotid diameter as the denominator. CONTRAST:  66m OMNIPAQUE IOHEXOL 350 MG/ML SOLN COMPARISON:  Plain head CT 1901 hours today. FINDINGS: CTA NECK Skeleton: Areas of poor dentition. Upper cervical spine ankylosis and degeneration. Prior sternotomy. No acute osseous abnormality identified. Upper chest: Negative visible upper lungs. No superior mediastinal lymphadenopathy. Partially visible left chest cardiac pacemaker type device. Other neck: Large right thyroid goiter with some retrosternal extension. Mild regional mass effect, no airway narrowing. Other neck soft tissues are within normal limits. Aortic arch: Calcified aortic atherosclerosis. Three vessel arch configuration. Right carotid system: Streak artifact at the thoracic inlet affecting the right CCA origin. Tortuous brachiocephalic artery and proximal right CCA, partially due to mass effect from the right thyroid goiter. No definite proximal right CCA stenosis. Calcified plaque at the right ICA origin and bulb resulting in less than 50 % stenosis with respect to the distal vessel. Left carotid system: No left CCA origin stenosis despite plaque. Mildly tortuous left CCA. Soft and calcified plaque at the left ICA origin and bulb without stenosis to the skull base. Vertebral arteries: Proximal right subclavian artery calcified plaque resulting in right subclavian origin stenosis of 70 % with respect to the distal vessel (series 8, image 121). Superimposed  bulky calcified plaque at the right vertebral artery origin but only mild stenosis. The right vertebral artery is patent to the skull base with mild V3 segment calcified plaque, no extracranial stenosis. Soft and calcified plaque in the proximal left subclavian artery without significant stenosis. Plaque near the left vertebral artery, but no origin stenosis. The left vertebral is mildly dominant and patent to the skull base without stenosis. CTA HEAD Posterior circulation: Bilateral proximal V4 segment calcified plaque. Moderate associated bilateral distal vertebral artery stenosis (series 7, image 156). Both PICA origins are patent nearby. Both vertebral arteries remain patent to the vertebrobasilar junction without additional plaque or stenosis. The basilar artery is diminutive but patent. SCA and PCA origins are patent. There is mild irregularity and stenosis at the left PCA origin on series 11, image 25. Right PCA branches are within normal limits. Mild left PCA branch irregularity. Posterior communicating arteries are diminutive or absent. Anterior circulation: Both ICA siphons are patent. Moderate calcified plaque on the left with moderate multifocal left ICA siphon stenosis including the cavernous and supraclinoid segments. On the right there is widespread siphon calcification and severe intermittent stenosis of the right siphon from the mid cavernous segment through the supraclinoid segment. See series 7, image 120). But both carotid termini remain patent. The left is dominant with a dominant left and diminutive or absent right ACA A1 segment. There is a dominant A2, simulating azygos ACA anatomy. ACA branches are within normal limits. Left MCA M1 and bifurcation are patent without stenosis. Left MCA branches within normal limits. Right MCA M1 and bifurcation are patent without stenosis.  Right MCA branches are within normal limits. Venous sinuses: Early contrast timing, not well evaluated. Anatomic  variants: Mildly dominant left vertebral artery. Dominant left and diminutive or absent right ACA A1 segments. Review of the MIP images confirms the above findings IMPRESSION: 1. Negative for large vessel occlusion. 2. But positive for multiple hemodynamically significant stenoses: - Severe Right ICA siphon stenosis due to calcified plaque. - Moderate contralateral left ICA siphon stenosis due to calcified plaque. - 70% stenosis of the right subclavian artery origin. - Moderate stenosis of both distal vertebral arteries as they cross the dura due to calcified plaque. 3. Aortic Atherosclerosis (ICD10-I70.0). 4. Right thyroid goiter. Arterial findings were communicated to Dr. Cheral Marker at 7:40 pm on 07/01/2019 by text page via the [facility] messaging system. Electronically Signed   By: Genevie Ann M.D.   On: 07/01/2019 19:41   Ct Angio Neck W Or Wo Contrast  Result Date: 07/01/2019 CLINICAL DATA:  Code stroke. 85 year old female with unexplained altered mental status and slurred speech. Last seen normal 1740 hours. EXAM: CT ANGIOGRAPHY HEAD AND NECK TECHNIQUE: Multidetector CT imaging of the head and neck was performed using the standard protocol during bolus administration of intravenous contrast. Multiplanar CT image reconstructions and MIPs were obtained to evaluate the vascular anatomy. Carotid stenosis measurements (when applicable) are obtained utilizing NASCET criteria, using the distal internal carotid diameter as the denominator. CONTRAST:  17m OMNIPAQUE IOHEXOL 350 MG/ML SOLN COMPARISON:  Plain head CT 1901 hours today. FINDINGS: CTA NECK Skeleton: Areas of poor dentition. Upper cervical spine ankylosis and degeneration. Prior sternotomy. No acute osseous abnormality identified. Upper chest: Negative visible upper lungs. No superior mediastinal lymphadenopathy. Partially visible left chest cardiac pacemaker type device. Other neck: Large right thyroid goiter with some retrosternal extension. Mild regional mass  effect, no airway narrowing. Other neck soft tissues are within normal limits. Aortic arch: Calcified aortic atherosclerosis. Three vessel arch configuration. Right carotid system: Streak artifact at the thoracic inlet affecting the right CCA origin. Tortuous brachiocephalic artery and proximal right CCA, partially due to mass effect from the right thyroid goiter. No definite proximal right CCA stenosis. Calcified plaque at the right ICA origin and bulb resulting in less than 50 % stenosis with respect to the distal vessel. Left carotid system: No left CCA origin stenosis despite plaque. Mildly tortuous left CCA. Soft and calcified plaque at the left ICA origin and bulb without stenosis to the skull base. Vertebral arteries: Proximal right subclavian artery calcified plaque resulting in right subclavian origin stenosis of 70 % with respect to the distal vessel (series 8, image 121). Superimposed bulky calcified plaque at the right vertebral artery origin but only mild stenosis. The right vertebral artery is patent to the skull base with mild V3 segment calcified plaque, no extracranial stenosis. Soft and calcified plaque in the proximal left subclavian artery without significant stenosis. Plaque near the left vertebral artery, but no origin stenosis. The left vertebral is mildly dominant and patent to the skull base without stenosis. CTA HEAD Posterior circulation: Bilateral proximal V4 segment calcified plaque. Moderate associated bilateral distal vertebral artery stenosis (series 7, image 156). Both PICA origins are patent nearby. Both vertebral arteries remain patent to the vertebrobasilar junction without additional plaque or stenosis. The basilar artery is diminutive but patent. SCA and PCA origins are patent. There is mild irregularity and stenosis at the left PCA origin on series 11, image 25. Right PCA branches are within normal limits. Mild left PCA branch irregularity. Posterior communicating arteries  are  diminutive or absent. Anterior circulation: Both ICA siphons are patent. Moderate calcified plaque on the left with moderate multifocal left ICA siphon stenosis including the cavernous and supraclinoid segments. On the right there is widespread siphon calcification and severe intermittent stenosis of the right siphon from the mid cavernous segment through the supraclinoid segment. See series 7, image 120). But both carotid termini remain patent. The left is dominant with a dominant left and diminutive or absent right ACA A1 segment. There is a dominant A2, simulating azygos ACA anatomy. ACA branches are within normal limits. Left MCA M1 and bifurcation are patent without stenosis. Left MCA branches within normal limits. Right MCA M1 and bifurcation are patent without stenosis. Right MCA branches are within normal limits. Venous sinuses: Early contrast timing, not well evaluated. Anatomic variants: Mildly dominant left vertebral artery. Dominant left and diminutive or absent right ACA A1 segments. Review of the MIP images confirms the above findings IMPRESSION: 1. Negative for large vessel occlusion. 2. But positive for multiple hemodynamically significant stenoses: - Severe Right ICA siphon stenosis due to calcified plaque. - Moderate contralateral left ICA siphon stenosis due to calcified plaque. - 70% stenosis of the right subclavian artery origin. - Moderate stenosis of both distal vertebral arteries as they cross the dura due to calcified plaque. 3. Aortic Atherosclerosis (ICD10-I70.0). 4. Right thyroid goiter. Arterial findings were communicated to Dr. Cheral Marker at 7:40 pm on 07/01/2019 by text page via the [facility] messaging system. Electronically Signed   By: Genevie Ann M.D.   On: 07/01/2019 19:41   Ct Head Code Stroke Wo Contrast  Result Date: 07/01/2019 CLINICAL DATA:  Code stroke. 85 year old female with unexplained altered mental status and slurred speech. Last seen normal 1740 hours. EXAM: CT HEAD WITHOUT  CONTRAST TECHNIQUE: Contiguous axial images were obtained from the base of the skull through the vertex without intravenous contrast. COMPARISON:  Head CT 02/09/2016. FINDINGS: Brain: Stable cerebral volume since 2017. No ventriculomegaly. No midline shift, mass effect, or evidence of intracranial mass lesion. No acute intracranial hemorrhage identified. Patchy white matter hypodensity in both hemispheres appears mild for age and stable. No cortically based acute infarct identified. No cortical encephalomalacia identified. Vascular: Calcified atherosclerosis at the skull base. No suspicious intracranial vascular hyperdensity. Skull: No acute osseous abnormality identified. Sinuses/Orbits: Visualized paranasal sinuses and mastoids are stable and well pneumatized. Other: Interval postoperative changes to both globes. No acute orbit or scalp soft tissue finding. ASPECTS [facility] Stroke Program Early CT Score) Total score (0-10 with 10 being normal): 10 IMPRESSION: 1. No acute intracranial abnormality. Stable non contrast CT appearance of the brain since 2017. 2.  ASPECTS 10. 3. These results were communicated to Drs. Aora and Lindzen at 7:19 pm on 07/01/2019 by text page via the [facility] messaging system. Electronically Signed   By: Genevie Ann M.D.   On: 07/01/2019 19:20    Micro Results    Recent Results (from the past 240 hour(s))  Novel Coronavirus, NAA (Hosp order, Send-out to Ref Lab; TAT 18-24 hrs     Status: None   Collection Time: 06/24/19 11:13 AM   Specimen: Nasopharyngeal Swab; Respiratory  Result Value Ref Range Status   SARS-CoV-2, NAA NOT DETECTED NOT DETECTED Final    Comment: (NOTE) Testing was performed using the cobas(R) SARS-CoV-2 test. This nucleic acid amplification test was developed and its performance characteristics determined by [employer]. Nucleic acid amplification tests include PCR and TMA. This test has not been FDA cleared or approved. This  test has been authorized  by FDA under an Emergency Use Authorization (EUA). This test is only authorized for the duration of time the declaration that circumstances exist justifying the authorization of the emergency use of in vitro diagnostic tests for detection of SARS-CoV-2 virus and/or diagnosis of COVID-19 infection under section 564(b)(1) of the Act, 21 U.S.C. 482NOI-3(B) (1), unless the authorization is terminated or revoked sooner. When diagnostic testing is negative, the possibility of a false negative result should be considered in the context of a patient's recent exposures and the presence of clinical signs and symptoms consistent with COVID-19. An individual without s ymptoms of COVID- 19 and who is not shedding SARS-CoV-2 virus would expect to have a negative (not detected) result in this assay. Performed At: [facility] [address] [place], [place] [id number] Rush Farmer MD IH:[id number]    [place]  Final    Comment: Performed at [facility], [place] [address] [place], [place] [address]  Surgical PCR screen     Status: None   Collection Time: 06/29/19  8:21 AM   Specimen: Nasal Mucosa; Nasal Swab  Result Value Ref Range Status   MRSA, PCR NEGATIVE NEGATIVE Final   Staphylococcus aureus NEGATIVE NEGATIVE Final    Comment: (NOTE) The Xpert SA Assay (FDA approved for NASAL specimens in patients 69 years of age and older), is one component of a comprehensive surveillance program. It is not intended to diagnose infection nor to guide or monitor treatment. Performed at [facility], [place] [address] [place], [place] [address]   SARS CORONAVIRUS 2 (TAT 6-24 HRS) Nasopharyngeal Nasopharyngeal Swab     Status: None   Collection Time: 07/01/19 10:35 PM   Specimen: Nasopharyngeal Swab  Result Value Ref Range Status   SARS Coronavirus 2 NEGATIVE NEGATIVE Final    Comment: (NOTE) SARS-CoV-2 target nucleic acids are NOT DETECTED. The SARS-CoV-2 RNA  is generally detectable in upper and lower respiratory specimens during the acute phase of infection. Negative results do not preclude SARS-CoV-2 infection, do not rule out co-infections with other pathogens, and should not be used as the sole basis for treatment or other patient management decisions. Negative results must be combined with clinical observations, patient history, and epidemiological information. The expected result is Negative. Fact Sheet for Patients: SugarRoll.be Fact Sheet for Healthcare Providers: https://www.woods-mathews.com/ This test is not yet approved or cleared by the [place] FDA and  has been authorized for detection and/or diagnosis of SARS-CoV-2 by FDA under an Emergency Use Authorization (EUA). This EUA will remain  in effect (meaning this test can be used) for the duration of the COVID-19 declaration under Section 56 4(b)(1) of the Act, 21 U.S.C. section 360bbb-3(b)(1), unless the authorization is terminated or revoked sooner. Performed at [facility], [place] [address] [place], [place] [address]     Today   Subjective    [name] today has no headache,no chest abdominal pain,no new weakness tingling or numbness, feels much better wants to go home today.    Objective   Blood pressure 135/65, pulse 69, temperature 98 F (36.7 C), resp. rate 16, SpO2 100 %.  No intake or output data in the 24 hours ending 07/02/19 1514  Exam Awake Alert, Oriented x 3, No new F.N deficits, Normal affect Raymond.AT,PERRAL Supple Neck,No JVD, No cervical lymphadenopathy appriciated.  Symmetrical Chest wall movement, Good air movement bilaterally, CTAB RRR,No Gallops,Rubs or new Murmurs, No Parasternal Heave +ve B.Sounds, Abd Soft, Non  tender, No organomegaly appriciated, No rebound -guarding or rigidity. No Cyanosis, Clubbing or edema, No new Rash or bruise   Data Review   CBC w Diff:  Lab Results   Component Value Date   WBC 7.5 07/02/2019   HGB 10.9 (L) 07/02/2019   HCT 35.1 (L) 07/02/2019   PLT 155 07/02/2019   LYMPHOPCT 49 07/02/2019   MONOPCT 8 07/02/2019   EOSPCT 2 07/02/2019   BASOPCT 0 07/02/2019    CMP:  Lab Results  Component Value Date   NA 138 07/02/2019   NA 141 12/14/2013   K 4.2 07/02/2019   CL 103 07/02/2019   CO2 24 07/02/2019   BUN 22 07/02/2019   BUN 18 12/14/2013   CREATININE 1.79 (H) 07/02/2019   PROT 7.1 07/02/2019   PROT 7.5 12/14/2013   ALBUMIN 3.6 07/02/2019   ALBUMIN 4.1 12/14/2013   BILITOT 0.5 07/02/2019   ALKPHOS 79 07/02/2019   AST 30 07/02/2019   ALT 25 07/02/2019  . Lab Results  Component Value Date   HGBA1C 7.6 (H) 07/02/2019    Lab Results  Component Value Date   CHOL 165 07/02/2019   HDL 55 07/02/2019   LDLCALC 95 07/02/2019   TRIG 74 07/02/2019   CHOLHDL 3.0 07/02/2019     Total Time in preparing paper work, data evaluation and todays exam - 21 minutes  Lala Lund M.D on 07/02/2019 at 3:14 PM  [facility]   Office  [phone]

## 2019-07-02 NOTE — Progress Notes (Signed)
Arrived from ED via stretcher at 0315. She is alert and oriented x4 and verbally responsive. Skin is intact, warm and dry with no wounds upon assessment. Tele box applied with continuous pulse ox.  She is on room air.Marland Kitchen Head of bed is elevated. No signs of distress/ denies pain. Bed bath given and [place] placed on pt. Oriented to ward, room, call bell , tv,and phone. Will cont to monitor, call bell and personal belongings within reach. Bed in lowest locked position.

## 2019-07-03 NOTE — Telephone Encounter (Signed)
Notified by nursing staff that patient discharged after hours last night. She was given RW but still needed referral for [facility] services. Spoke with patient and she stated that she had [facility] in the past and would like to use them again, her second choice would be [facility]. [facility] could not accept patient, [facility] did accept referral. Patient updated. NO other CM needs.
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
## 2019-07-14 ENCOUNTER — Other Ambulatory Visit: Payer: Self-pay

## 2019-07-14 ENCOUNTER — Ambulatory Visit (INDEPENDENT_AMBULATORY_CARE_PROVIDER_SITE_OTHER): Payer: Medicare Other | Admitting: Student

## 2019-07-14 DIAGNOSIS — Z950 Presence of cardiac pacemaker: Secondary | ICD-10-CM

## 2019-07-14 LAB — CUP PACEART INCLINIC DEVICE CHECK
Battery Remaining Longevity: 78 mo
Battery Voltage: 3.01 V
Brady Statistic RA Percent Paced: 95 %
Brady Statistic RV Percent Paced: 99.98 %
Date Time Interrogation Session: 20200924132222
Implantable Lead Implant Date: 20140210
Implantable Lead Implant Date: 20140210
Implantable Lead Implant Date: 20140210
Implantable Lead Location: 753858
Implantable Lead Location: 753859
Implantable Lead Location: 753860
Implantable Pulse Generator Implant Date: 20200909
Lead Channel Impedance Value: 350 Ohm
Lead Channel Impedance Value: 512.5 Ohm
Lead Channel Impedance Value: 825 Ohm
Lead Channel Pacing Threshold Amplitude: 0.75 V
Lead Channel Pacing Threshold Amplitude: 0.75 V
Lead Channel Pacing Threshold Amplitude: 1 V
Lead Channel Pacing Threshold Amplitude: 1 V
Lead Channel Pacing Threshold Amplitude: 1.5 V
Lead Channel Pacing Threshold Amplitude: 1.5 V
Lead Channel Pacing Threshold Pulse Width: 0.4 ms
Lead Channel Pacing Threshold Pulse Width: 0.4 ms
Lead Channel Pacing Threshold Pulse Width: 0.4 ms
Lead Channel Pacing Threshold Pulse Width: 0.4 ms
Lead Channel Pacing Threshold Pulse Width: 0.4 ms
Lead Channel Pacing Threshold Pulse Width: 0.4 ms
Lead Channel Sensing Intrinsic Amplitude: 3.7 mV
Lead Channel Setting Pacing Amplitude: 2 V
Lead Channel Setting Pacing Amplitude: 2.5 V
Lead Channel Setting Pacing Amplitude: 2.5 V
Lead Channel Setting Pacing Pulse Width: 0.4 ms
Lead Channel Setting Pacing Pulse Width: 0.4 ms
Lead Channel Setting Sensing Sensitivity: 4 mV
Pulse Gen Model: 3222
Pulse Gen Serial Number: 9009789

## 2019-07-14 NOTE — Progress Notes (Signed)
Wound check appointment. Steri-strips removed. Wound without redness or edema. Incision edges approximated, wound well healed. Normal device function. Thresholds, sensing, and impedances consistent with implant measurements. Device programmed at appropriate safety margin for chronic leads. Histogram distribution appropriate for patient and level of activity. No mode switches or high ventricular rates noted. Patient educated about wound care, arm mobility, lifting restrictions. ROV in 3 months with Dr. Caryl Comes. Next remote 10/18/19  Beryle Beams" Caban, PA-C  07/14/2019 1:22 PM

## 2019-08-22 NOTE — Progress Notes (Deleted)
PATIENT: [name] DOB: 12/09/1927  REASON FOR VISIT: follow up HISTORY FROM: patient  HISTORY OF PRESENT ILLNESS: Today 08/22/19  [name] is an 85 year old female with history of sleep apnea on CPAP, and memory disturbance.  After her last office visit, she was started on Aricept 5 mg at bedtime.  She was admitted to the hospital in September for a possible TIA.  She has a pacemaker, MRI of the brain was not able to be completed.  She remains on Plavix alone.  Her symptoms included difficulty speaking and confusion.  HISTORY 04/14/2019 Dr. Jannifer Franklin: [name] is an 85 year old right-handed black female with a history of sleep apnea on CPAP, she has been seen here previously in August 2017 for subjective visual disturbances.  And October 2017, the family contacted our office indicating that she was having some troubles with memory and wished to go on a medication for memory.  Aricept was called in at that time.  It is not clear that the patient has continued this medication.  The patient comes to the office by herself.  She claims that she does not really believe she has significant issues with the memory.  She has not driven a car in greater than 20 years.  She lives with her son, he helps manage her medications and appointments, he will pay the bills, he does the cooking.  The patient claims that she sleeps fairly well at night, she has good energy level during the day.  She has a lot of arthritis in the back and in the knees and has difficulty walking because of this, she does not use a cane.  She reports no recent falls.  She returns for an evaluation.  REVIEW OF SYSTEMS: Out of a complete 14 system review of symptoms, the patient complains only of the following symptoms, and all other reviewed systems are negative.  ALLERGIES: No Known Allergies  HOME MEDICATIONS: Outpatient Medications Prior to Visit  Medication Sig Dispense Refill   amLODipine (NORVASC) 2.5 MG tablet Take  2.5 mg by mouth every morning.     atorvastatin (LIPITOR) 80 MG tablet Take 80 mg by mouth every morning.   5   BAYER CONTOUR TEST test strip      brimonidine-timolol (COMBIGAN) 0.2-0.5 % ophthalmic solution Place 1 drop into both eyes 2 (two) times daily.     calcitRIOL (ROCALTROL) 0.25 MCG capsule Take 0.25 mcg by mouth every morning.     carvedilol (COREG) 3.125 MG tablet Take 3.125 mg by mouth 2 (two) times daily.  3   carvedilol (COREG) 6.25 MG tablet Take 1 tablet (6.25 mg total) by mouth 2 (two) times daily. (Patient not taking: Reported on 07/01/2019) 180 tablet 3   clopidogrel (PLAVIX) 75 MG tablet Take 1 tablet (75 mg total) by mouth daily. 30 tablet 0   Cod Liver Oil CAPS Take 1 capsule by mouth every morning.     docusate sodium 100 MG CAPS Take 100 mg by mouth 2 (two) times daily. (Patient not taking: Reported on 07/01/2019) 60 capsule 0   donepezil (ARICEPT) 10 MG tablet Take 10 mg by mouth at bedtime.     donepezil (ARICEPT) 5 MG tablet TAKE 1 TABLET BY MOUTH EVERYDAY AT BEDTIME (Patient not taking: Reported on 07/01/2019) 30 tablet 5   dorzolamide (TRUSOPT) 2 % ophthalmic solution Place 1 drop into both eyes 2 (two) times daily.      insulin degludec (TRESIBA FLEXTOUCH) 100 UNIT/ML SOPN FlexTouch Pen Inject  10-20 Units into the skin at bedtime. Based on CBG     Insulin Lispro (HUMALOG KWIKPEN) 200 UNIT/ML SOPN Inject 6 Units into the skin 3 (three) times daily as needed (high blood sugar).      latanoprost (XALATAN) 0.005 % ophthalmic solution Appointment OVERDUE, place 1 drop into both eyes daily at bedtime (Patient taking differently: Place 1 drop into both eyes at bedtime. ) 2.5 mL 0   linagliptin (TRADJENTA) 5 MG TABS tablet Take 5 mg by mouth every morning.     loperamide (IMODIUM) 2 MG capsule Take 2 mg by mouth as needed for diarrhea or loose stools.     lubiprostone (AMITIZA) 24 MCG capsule Take 1 capsule (24 mcg total) by mouth 2 (two) times daily with a  meal. (Patient not taking: Reported on 07/01/2019) 90 capsule 3   Multiple Vitamin (MULTIVITAMIN WITH MINERALS) TABS Take 1 tablet by mouth every morning.      Multiple Vitamins-Minerals (EYE VITAMINS) CAPS Take 1 capsule by mouth every morning.     niacin 100 MG tablet Take 1 tablet (100 mg total) by mouth at bedtime. 30 tablet 0   nitroGLYCERIN (NITROSTAT) 0.4 MG SL tablet Place 0.4 mg under the tongue every 5 (five) minutes as needed for chest pain.      NOVOFINE 32G X 6 MM MISC      pantoprazole (PROTONIX) 40 MG tablet Take 40 mg by mouth every morning.      polyethylene glycol powder (GLYCOLAX/MIRALAX) powder TAKE 17 G BY MOUTH DAILY. HOLD FOR LOOSE STOOL (Patient taking differently: Take 17 g by mouth daily as needed (constipation). ) 3162 g 1   valsartan-hydrochlorothiazide (DIOVAN-HCT) 320-12.5 MG tablet Take 1 tablet by mouth every morning.      No facility-administered medications prior to visit.     PAST MEDICAL HISTORY: Past Medical History:  Diagnosis Date   Anemia    Arthritis    Celiac disease    Chronic systolic CHF (congestive heart failure) (HCC)    Complete heart block ([place])    Coronary artery disease    Diabetes mellitus    INSULIN DEPENDENT   GERD (gastroesophageal reflux disease)    Glaucoma    Headache(784.0)    Heart disease    Hypercholesterolemia    Hypertension    Iron deficiency anemia, unspecified    Ischemic cardiomyopathy    Memory difficulties 04/14/2019   Shortness of breath    Sleep apnea    uses cpap   Stroke [facility])    Unspecified constipation     PAST SURGICAL HISTORY: Past Surgical History:  Procedure Laterality Date   BACK SURGERY     BI-VENTRICULAR PACEMAKER INSERTION (CRT-P)  March 2014   [facility]   BIV PACEMAKER GENERATOR CHANGEOUT N/A 06/29/2019   Procedure: BIV PACEMAKER GENERATOR CHANGEOUT;  Surgeon: Evans Lance, MD;  Location: [facility];  Service: Cardiovascular;  Laterality: N/A;    CARDIAC CATHETERIZATION  09/02/2012   CARDIAC SURGERY     CORONARY ANGIOPLASTY WITH STENT PLACEMENT  09/02/2012   RCA   PERCUTANEOUS CORONARY STENT INTERVENTION (PCI-S) N/A 09/02/2012   Procedure: PERCUTANEOUS CORONARY STENT INTERVENTION (PCI-S);  Surgeon: Clent Demark, MD;  Location: [facility] CATH LAB;  Service: Cardiovascular;  Laterality: N/A;   PERMANENT PACEMAKER INSERTION N/A 11/29/2012   Procedure: PERMANENT PACEMAKER INSERTION;  Surgeon: Deboraha Sprang, MD;  Location[facility] CATH LAB;  Service: Cardiovascular;  Laterality: N/A;   TEMPORARY PACEMAKER INSERTION N/A 11/28/2012   Procedure:  TEMPORARY PACEMAKER INSERTION;  Surgeon: Clent Demark, MD;  Location: [facility] CATH LAB;  Service: Cardiovascular;  Laterality: N/A;    FAMILY HISTORY: Family History  Problem Relation Age of Onset   Diabetes Mother    Hypertension Mother    Hyperlipidemia Mother    Cancer Sister    Dementia Sister    Neuropathy Sister     SOCIAL HISTORY: Social History   Socioeconomic History   Marital status: Widowed    Spouse name: Not on file   Number of children: 4   Years of education: 94   Highest education level: Not on file  Occupational History   Occupation: Retired  Scientist, product/process development strain: Not on file   Food insecurity    Worry: Not on file    Inability: Not on file   Transportation needs    Medical: Not on file    Non-medical: Not on file  Tobacco Use   Smoking status: Former Smoker    Quit date: 12/26/1976    Years since quitting: 42.6   Smokeless tobacco: Never Used  Substance and Sexual Activity   Alcohol use: No    Alcohol/week: 0.0 standard drinks   Drug use: No   Sexual activity: Never  Lifestyle   Physical activity    Days per week: Not on file    Minutes per session: Not on file   Stress: Not on file  Relationships   Social connections    Talks on phone: Not on file    Gets together: Not on file    Attends religious service: Not  on file    Active member of club or organization: Not on file    Attends meetings of clubs or organizations: Not on file    Relationship status: Not on file   Intimate partner violence    Fear of current or ex partner: Not on file    Emotionally abused: Not on file    Physically abused: Not on file    Forced sexual activity: Not on file  Other Topics Concern   Not on file  Social History Narrative   Regular exercise-no   Caffeine Use-yes   Patient lives at home at home with [name] her daughter.   Retired   Right handed   Education 12th   Caffeine one cup of coffee daily      PHYSICAL EXAM  There were no vitals filed for this visit. There is no height or weight on file to calculate BMI.  Generalized: Well developed, in no acute distress   Neurological examination  Mentation: Alert oriented to time, place, history taking. Follows all commands speech and language fluent Cranial nerve II-XII: Pupils were equal round reactive to light. Extraocular movements were full, visual field were full on confrontational test. Facial sensation and strength were normal. Uvula tongue midline. Head turning and shoulder shrug  were normal and symmetric. Motor: The motor testing reveals 5 over 5 strength of all 4 extremities. Good symmetric motor tone is noted throughout.  Sensory: Sensory testing is intact to soft touch on all 4 extremities. No evidence of extinction is noted.  Coordination: Cerebellar testing reveals good finger-nose-finger and heel-to-shin bilaterally.  Gait and station: Gait is normal. Tandem gait is normal. Romberg is negative. No drift is seen.  Reflexes: Deep tendon reflexes are symmetric and normal bilaterally.   DIAGNOSTIC DATA (LABS, IMAGING, TESTING) - I reviewed patient records, labs, notes, testing and imaging myself where available.  Lab Results  Component Value Date   WBC 7.5 07/02/2019   HGB 10.9 (L) 07/02/2019   HCT 35.1 (L) 07/02/2019   MCV 91.9  07/02/2019   PLT 155 07/02/2019      Component Value Date/Time   NA 138 07/02/2019 0437   NA 141 12/14/2013 1622   K 4.2 07/02/2019 0437   CL 103 07/02/2019 0437   CO2 24 07/02/2019 0437   GLUCOSE 163 (H) 07/02/2019 0437   BUN 22 07/02/2019 0437   BUN 18 12/14/2013 1622   CREATININE 1.79 (H) 07/02/2019 0437   CALCIUM 11.1 (H) 07/02/2019 0437   PROT 7.1 07/02/2019 0437   PROT 7.5 12/14/2013 1622   ALBUMIN 3.6 07/02/2019 0437   ALBUMIN 4.1 12/14/2013 1622   AST 30 07/02/2019 0437   ALT 25 07/02/2019 0437   ALKPHOS 79 07/02/2019 0437   BILITOT 0.5 07/02/2019 0437   GFRNONAA 25 (L) 07/02/2019 0437   GFRAA 29 (L) 07/02/2019 0437   Lab Results  Component Value Date   CHOL 165 07/02/2019   HDL 55 07/02/2019   LDLCALC 95 07/02/2019   TRIG 74 07/02/2019   CHOLHDL 3.0 07/02/2019   Lab Results  Component Value Date   HGBA1C 7.6 (H) 07/02/2019   Lab Results  Component Value Date   RAQTMAUQ33 354 06/06/2016   Lab Results  Component Value Date   TSH 2.938 07/26/2013      ASSESSMENT AND PLAN 85 y.o. year old female  has a past medical history of Anemia, Arthritis, Celiac disease, Chronic systolic CHF (congestive heart failure) ([place]), Complete heart block ([place]), Coronary artery disease, Diabetes mellitus, GERD (gastroesophageal reflux disease), Glaucoma, Headache(784.0), Heart disease, Hypercholesterolemia, Hypertension, Iron deficiency anemia, unspecified, Ischemic cardiomyopathy, Memory difficulties (04/14/2019), Shortness of breath, Sleep apnea, Stroke ([place]), and Unspecified constipation. here with ***   I spent 15 minutes with the patient. 50% of this time was spent   Butler Denmark, [place], DNP 08/22/2019, 12:26 PM [facility] [address], [place], [place] [address] [phone]

## 2019-08-24 ENCOUNTER — Ambulatory Visit: Payer: Medicare Other | Admitting: Neurology

## 2019-08-24 ENCOUNTER — Telehealth: Payer: Self-pay | Admitting: Neurology

## 2019-08-24 ENCOUNTER — Encounter: Payer: Self-pay | Admitting: Neurology

## 2019-08-24 NOTE — Telephone Encounter (Signed)
Pt and daughter arrived late to 11/04 app. And would like to discuss medication as soon as possible.

## 2019-08-24 NOTE — Telephone Encounter (Signed)
Lvm for pt's daughter to call back.

## 2019-08-25 NOTE — Telephone Encounter (Signed)
I reached out to the pt's daughter. She reports pcp started the pt on Cerefoline and she wanted to confirm if it was compatible with the pt's Aricept.  I advised the pt's daughter I could could confirm with the work in doctor due to Dr. Jannifer Franklin being out of town.  She stated she did not want the work in to review this message and requested Dr. Jannifer Franklin call her.  I advised Dr. Jannifer Franklin will be out of the office until 09/01/2019 and pt's daughter states she will wait for Dr. Jannifer Franklin to return and wait for him to call.

## 2019-08-29 MED ORDER — DONEPEZIL HCL 23 MG PO TABS: 23.0000 mg | ORAL_TABLET | Freq: Every day | ORAL | 3 refills | Status: DC

## 2019-08-29 NOTE — Addendum Note (Signed)
Addended by: Kathrynn Ducking on: 08/29/2019 10:55 AM   Modules accepted: Orders

## 2019-08-29 NOTE — Telephone Encounter (Signed)
I called and talked with the daughter.  The patient continues to progress with the memory, she is on Aricept 10 mg daily and seems to tolerate this.  We discussed potential going to the 23 mg brand-name Aricept tablet, they are amenable to this and I will call in the prescription.

## 2019-08-29 NOTE — Telephone Encounter (Signed)
Pt daughter is asking for a call from Dr Jannifer Franklin to discuss pt worsening, daughter wants to discuss the Aricept

## 2019-09-14 ENCOUNTER — Ambulatory Visit: Payer: Medicare Other | Admitting: Neurology

## 2019-10-09 ENCOUNTER — Emergency Department (HOSPITAL_COMMUNITY): Payer: Medicare Other

## 2019-10-09 ENCOUNTER — Observation Stay (HOSPITAL_COMMUNITY)
Admission: EM | Admit: 2019-10-09 | Discharge: 2019-10-10 | Disposition: A | Discharge status: Home / Self Care | Payer: Medicare Other | Attending: Family Medicine | Admitting: Family Medicine

## 2019-10-09 ENCOUNTER — Other Ambulatory Visit: Payer: Self-pay

## 2019-10-09 ENCOUNTER — Observation Stay (HOSPITAL_COMMUNITY): Payer: Medicare Other

## 2019-10-09 DIAGNOSIS — K219 Gastro-esophageal reflux disease without esophagitis: Secondary | ICD-10-CM | POA: Diagnosis not present

## 2019-10-09 DIAGNOSIS — D649 Anemia, unspecified: Secondary | ICD-10-CM | POA: Insufficient documentation

## 2019-10-09 DIAGNOSIS — Z87891 Personal history of nicotine dependence: Secondary | ICD-10-CM | POA: Diagnosis not present

## 2019-10-09 DIAGNOSIS — Z950 Presence of cardiac pacemaker: Secondary | ICD-10-CM | POA: Diagnosis not present

## 2019-10-09 DIAGNOSIS — E785 Hyperlipidemia, unspecified: Secondary | ICD-10-CM | POA: Diagnosis not present

## 2019-10-09 DIAGNOSIS — G459 Transient cerebral ischemic attack, unspecified: Secondary | ICD-10-CM | POA: Diagnosis not present

## 2019-10-09 DIAGNOSIS — I442 Atrioventricular block, complete: Secondary | ICD-10-CM | POA: Insufficient documentation

## 2019-10-09 DIAGNOSIS — I10 Essential (primary) hypertension: Secondary | ICD-10-CM

## 2019-10-09 DIAGNOSIS — Z79899 Other long term (current) drug therapy: Secondary | ICD-10-CM | POA: Diagnosis not present

## 2019-10-09 DIAGNOSIS — Z794 Long term (current) use of insulin: Secondary | ICD-10-CM | POA: Diagnosis not present

## 2019-10-09 DIAGNOSIS — E7800 Pure hypercholesterolemia, unspecified: Secondary | ICD-10-CM | POA: Insufficient documentation

## 2019-10-09 DIAGNOSIS — E119 Type 2 diabetes mellitus without complications: Secondary | ICD-10-CM | POA: Diagnosis not present

## 2019-10-09 DIAGNOSIS — I255 Ischemic cardiomyopathy: Secondary | ICD-10-CM | POA: Diagnosis not present

## 2019-10-09 DIAGNOSIS — R200 Anesthesia of skin: Secondary | ICD-10-CM

## 2019-10-09 DIAGNOSIS — Z7902 Long term (current) use of antithrombotics/antiplatelets: Secondary | ICD-10-CM | POA: Diagnosis not present

## 2019-10-09 DIAGNOSIS — I110 Hypertensive heart disease with heart failure: Secondary | ICD-10-CM | POA: Diagnosis not present

## 2019-10-09 DIAGNOSIS — M79603 Pain in arm, unspecified: Secondary | ICD-10-CM | POA: Diagnosis present

## 2019-10-09 DIAGNOSIS — H409 Unspecified glaucoma: Secondary | ICD-10-CM | POA: Diagnosis not present

## 2019-10-09 DIAGNOSIS — Z20828 Contact with and (suspected) exposure to other viral communicable diseases: Secondary | ICD-10-CM | POA: Diagnosis not present

## 2019-10-09 DIAGNOSIS — I509 Heart failure, unspecified: Secondary | ICD-10-CM | POA: Diagnosis not present

## 2019-10-09 DIAGNOSIS — I2510 Atherosclerotic heart disease of native coronary artery without angina pectoris: Secondary | ICD-10-CM | POA: Diagnosis not present

## 2019-10-09 DIAGNOSIS — Z955 Presence of coronary angioplasty implant and graft: Secondary | ICD-10-CM | POA: Insufficient documentation

## 2019-10-09 DIAGNOSIS — M1990 Unspecified osteoarthritis, unspecified site: Secondary | ICD-10-CM | POA: Diagnosis not present

## 2019-10-09 DIAGNOSIS — G4733 Obstructive sleep apnea (adult) (pediatric): Secondary | ICD-10-CM | POA: Insufficient documentation

## 2019-10-09 DIAGNOSIS — Z8673 Personal history of transient ischemic attack (TIA), and cerebral infarction without residual deficits: Secondary | ICD-10-CM | POA: Diagnosis not present

## 2019-10-09 LAB — URINALYSIS, ROUTINE W REFLEX MICROSCOPIC
Bilirubin Urine: NEGATIVE
Glucose, UA: NEGATIVE mg/dL
Hgb urine dipstick: NEGATIVE
Ketones, ur: NEGATIVE mg/dL
Leukocytes,Ua: NEGATIVE
Nitrite: NEGATIVE
Protein, ur: NEGATIVE mg/dL
Specific Gravity, Urine: 1.004 — ABNORMAL LOW (ref 1.005–1.030)
pH: 6 (ref 5.0–8.0)

## 2019-10-09 LAB — ETHANOL: Alcohol, Ethyl (B): <10 mg/dL (ref <10)

## 2019-10-09 LAB — CBC
HCT: 35.7 % — ABNORMAL LOW (ref 36.0–46.0)
Hemoglobin: 11.5 g/dL — ABNORMAL LOW (ref 12.0–15.0)
MCH: 29 pg (ref 26.0–34.0)
MCHC: 32.2 g/dL (ref 30.0–36.0)
MCV: 89.9 fL (ref 80.0–100.0)
Platelets: 243 10*3/uL (ref 150–400)
RBC: 3.97 MIL/uL (ref 3.87–5.11)
RDW: 12.5 % (ref 11.5–15.5)
WBC: 7 10*3/uL (ref 4.0–10.5)
nRBC: 0 % (ref 0.0–0.2)

## 2019-10-09 LAB — COMPREHENSIVE METABOLIC PANEL
ALT: 26 U/L (ref 0–44)
AST: 23 U/L (ref 15–41)
Albumin: 3.9 g/dL (ref 3.5–5.0)
Alkaline Phosphatase: 68 U/L (ref 38–126)
Anion gap: 7 (ref 5–15)
BUN: 15 mg/dL (ref 8–23)
CO2: 29 mmol/L (ref 22–32)
Calcium: 10.7 mg/dL — ABNORMAL HIGH (ref 8.9–10.3)
Chloride: 103 mmol/L (ref 98–111)
Creatinine, Ser: 1.24 mg/dL — ABNORMAL HIGH (ref 0.44–1.00)
GFR calc Af Amer: 45 mL/min — ABNORMAL LOW (ref >60)
GFR calc non Af Amer: 38 mL/min — ABNORMAL LOW (ref >60)
Glucose, Bld: 124 mg/dL — ABNORMAL HIGH (ref 70–99)
Potassium: 3.7 mmol/L (ref 3.5–5.1)
Sodium: 139 mmol/L (ref 135–145)
Total Bilirubin: 0.6 mg/dL (ref 0.3–1.2)
Total Protein: 7.4 g/dL (ref 6.5–8.1)

## 2019-10-09 LAB — DIFFERENTIAL
Abs Immature Granulocytes: 0.01 10*3/uL (ref 0.00–0.07)
Basophils Absolute: 0 10*3/uL (ref 0.0–0.1)
Basophils Relative: 1 %
Eosinophils Absolute: 0.1 10*3/uL (ref 0.0–0.5)
Eosinophils Relative: 1 %
Immature Granulocytes: 0 %
Lymphocytes Relative: 41 %
Lymphs Abs: 2.9 10*3/uL (ref 0.7–4.0)
Monocytes Absolute: 0.6 10*3/uL (ref 0.1–1.0)
Monocytes Relative: 8 %
Neutro Abs: 3.4 10*3/uL (ref 1.7–7.7)
Neutrophils Relative %: 49 %

## 2019-10-09 LAB — APTT: aPTT: 31 seconds (ref 24–36)

## 2019-10-09 LAB — I-STAT CHEM 8, ED
BUN: 16 mg/dL (ref 8–23)
Calcium, Ion: 1.41 mmol/L — ABNORMAL HIGH (ref 1.15–1.40)
Chloride: 101 mmol/L (ref 98–111)
Creatinine, Ser: 1.1 mg/dL — ABNORMAL HIGH (ref 0.44–1.00)
Glucose, Bld: 119 mg/dL — ABNORMAL HIGH (ref 70–99)
HCT: 36 % (ref 36.0–46.0)
Hemoglobin: 12.2 g/dL (ref 12.0–15.0)
Potassium: 3.7 mmol/L (ref 3.5–5.1)
Sodium: 139 mmol/L (ref 135–145)
TCO2: 30 mmol/L (ref 22–32)

## 2019-10-09 LAB — LACTIC ACID, PLASMA
Lactic Acid, Venous: 1.5 mmol/L (ref 0.5–1.9)
Lactic Acid, Venous: 0.8 mmol/L (ref 0.5–1.9)

## 2019-10-09 LAB — PROTIME-INR
INR: 0.9 (ref 0.8–1.2)
Prothrombin Time: 12.5 seconds (ref 11.4–15.2)

## 2019-10-09 LAB — RAPID URINE DRUG SCREEN, HOSP PERFORMED
Amphetamines: NOT DETECTED
Barbiturates: NOT DETECTED
Benzodiazepines: NOT DETECTED
Cocaine: NOT DETECTED
Opiates: NOT DETECTED
Tetrahydrocannabinol: NOT DETECTED

## 2019-10-09 LAB — CBG MONITORING, ED: Glucose-Capillary: 115 mg/dL — ABNORMAL HIGH (ref 70–99)

## 2019-10-09 MED ORDER — ACETAMINOPHEN 325 MG PO TABS: 650.0000 mg | ORAL_TABLET | ORAL | Status: DC | PRN

## 2019-10-09 MED ORDER — ACETAMINOPHEN 650 MG RE SUPP: 650.0000 mg | RECTAL | Status: DC | PRN

## 2019-10-09 MED ORDER — ENOXAPARIN SODIUM 40 MG/0.4ML ~~LOC~~ SOLN
40.0000 mg | SUBCUTANEOUS | Status: DC
  Administered 2019-10-09: 40 mg via SUBCUTANEOUS
  Filled 2019-10-09: qty 0.4

## 2019-10-09 MED ORDER — SENNOSIDES-DOCUSATE SODIUM 8.6-50 MG PO TABS: 1.0000 | ORAL_TABLET | Freq: Every evening | ORAL | Status: DC | PRN

## 2019-10-09 MED ORDER — STROKE: EARLY STAGES OF RECOVERY BOOK
Freq: Once | Status: DC
  Filled 2019-10-09: qty 1

## 2019-10-09 MED ORDER — ACETAMINOPHEN 160 MG/5ML PO SOLN: 650.0000 mg | ORAL | Status: DC | PRN

## 2019-10-09 NOTE — ED Notes (Signed)
Admitting MD in to assess pt for admission
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
## 2019-10-09 NOTE — H&P (Signed)
History and Physical    [name] TDD:[id number] DOB: [birth year] DOA: 10/09/2019  PCP: [name], MD  Patient coming from: Home, rotates between living with son and daughter. Daughter at bedside.  I have personally briefly reviewed patient's old medical records in [place]  Chief Complaint: Left hand pain/numbness  HPI: [name] is a 85 y.o. female with medical history significant of CVA, CAD, hypertension, complete heart block s/p pacemaker, OSA on CPAP, insulin dependent Type 2 diabetes who presents with concerns of left arm pain and numbness that started about 1pm this afternoon.  Patient noticed acute pain of her left hand that eventually radiated up her arm and to her back. Also had numbness and tingling of the hand. Granddaughter happened to call her and noticed that she had slurred speech and called EMS. Prior to EMS arrival, patient started to shake and cry uncontrollably. She was aware that this was happening. No urinary or bowel incontinence. On arrival at triage she was noted to have facial droop and a code stroke was called. CT head had no acute findings.  She was unable to get MRI due to pacemaker incompatibility.   ED Course: She was afebrile, hypertensive up to 170/60s on ambient air. No leukocytosis. Hemoglobin of 11.5. Glucose of 124, creatinine of 1.24.  Negative UA, UDS.   She was evaluated by neurology prior to admission.   Review of Systems:  Constitutional:  No Fever ENT/Mouth: No sore throat, No Rhinorrhea Eyes: + Vision Changes Cardiovascular: No Chest Pain, no SOB Respiratory: No Cough Gastrointestinal: No Nausea, No Vomiting,  Genitourinary: no Urinary Incontinence Musculoskeletal: No Arthralgias, +generalized body ache Skin: No Skin Lesions, No Pruritus, Neuro: no Weakness, + Numbness,  No Loss of Consciousness, No Syncope Psych:  no decrease appetite Heme/Lymph: No Bruising, No Bleeding  Past Medical History:  Diagnosis Date   . Anemia   . Arthritis   . Celiac disease   . Chronic systolic CHF (congestive heart failure) ([employer])   . Complete heart block ([place])   . Coronary artery disease   . Diabetes mellitus    INSULIN DEPENDENT  . GERD (gastroesophageal reflux disease)   . Glaucoma   . Headache(784.0)   . Heart disease   . Hypercholesterolemia   . Hypertension   . Iron deficiency anemia, unspecified   . Ischemic cardiomyopathy   . Memory difficulties 04/14/2019  . Shortness of breath   . Sleep apnea    uses cpap  . Stroke ([place])   . Unspecified constipation     Past Surgical History:  Procedure Laterality Date  . BACK SURGERY    . BI-VENTRICULAR PACEMAKER INSERTION (CRT-P)  March 2014   [facility]  . BIV PACEMAKER GENERATOR CHANGEOUT N/A 06/29/2019   Procedure: BIV PACEMAKER GENERATOR CHANGEOUT;  Surgeon: Evans Lance, MD;  Location: [facility];  Service: Cardiovascular;  Laterality: N/A;  . CARDIAC CATHETERIZATION  09/02/2012  . CARDIAC SURGERY    . CORONARY ANGIOPLASTY WITH STENT PLACEMENT  09/02/2012   RCA  . PERCUTANEOUS CORONARY STENT INTERVENTION (PCI-S) N/A 09/02/2012   Procedure: PERCUTANEOUS CORONARY STENT INTERVENTION (PCI-S);  Surgeon: Clent Demark, MD;  Location: [facility] CATH LAB;  Service: Cardiovascular;  Laterality: N/A;  . PERMANENT PACEMAKER INSERTION N/A 11/29/2012   Procedure: PERMANENT PACEMAKER INSERTION;  Surgeon: Deboraha Sprang, MD;  Location: [facility] CATH LAB;  Service: Cardiovascular;  Laterality: N/A;  . TEMPORARY PACEMAKER INSERTION N/A 11/28/2012   Procedure: TEMPORARY PACEMAKER INSERTION;  Surgeon:  Clent Demark, MD;  Location: [facility] CATH LAB;  Service: Cardiovascular;  Laterality: N/A;     reports that she quit smoking about 42 years ago. She has never used smokeless tobacco. She reports that she does not drink alcohol or use drugs.  No Known Allergies  Family History  Problem Relation Age of Onset  . Diabetes Mother   . Hypertension Mother   . Hyperlipidemia  Mother   . Cancer Sister   . Dementia Sister   . Neuropathy Sister      Prior to Admission medications   Medication Sig Start Date End Date Taking? Authorizing Provider  amLODipine (NORVASC) 2.5 MG tablet Take 2.5 mg by mouth every morning.   Yes [provider]  atorvastatin (LIPITOR) 80 MG tablet Take 80 mg by mouth every morning.  04/17/15  Yes [provider]  brimonidine-timolol (COMBIGAN) 0.2-0.5 % ophthalmic solution Place 1 drop into both eyes 2 (two) times daily.   Yes [provider]  calcitRIOL (ROCALTROL) 0.25 MCG capsule Take 0.25 mcg by mouth every morning.   Yes [provider]  carvedilol (COREG) 3.125 MG tablet Take 3.125 mg by mouth 2 (two) times daily. 04/17/15  Yes [provider]  clopidogrel (PLAVIX) 75 MG tablet Take 1 tablet (75 mg total) by mouth daily. 07/03/19  Yes Thurnell Lose, MD  [facility] Liver Oil CAPS Take 1 capsule by mouth every morning.   Yes [provider]  docusate sodium 100 MG CAPS Take 100 mg by mouth 2 (two) times daily. 10/22/12  Yes Annita Brod, MD  dorzolamide (TRUSOPT) 2 % ophthalmic solution Place 1 drop into both eyes 2 (two) times daily.  07/28/13  Yes [provider]  ferrous sulfate 325 (65 FE) MG tablet Take 325 mg by mouth daily with breakfast.   Yes [provider]  insulin degludec (TRESIBA FLEXTOUCH) 100 UNIT/ML SOPN FlexTouch Pen Inject 10-20 Units into the skin at bedtime. Based on CBG   Yes [provider]  Insulin Lispro (HUMALOG KWIKPEN) 200 UNIT/ML SOPN Inject 6 Units into the skin 3 (three) times daily as needed (high blood sugar).    Yes [provider]  latanoprost (XALATAN) 0.005 % ophthalmic solution Appointment OVERDUE, place 1 drop into both eyes daily at bedtime Patient taking differently: Place 1 drop into both eyes at bedtime.  03/02/14  Yes Blanchie Serve, MD  linagliptin (TRADJENTA) 5 MG TABS tablet Take 5 mg by mouth every morning.    Yes [provider]  loperamide (IMODIUM) 2 MG capsule Take 2 mg by mouth as needed for diarrhea or loose stools.   Yes [provider]  lubiprostone (AMITIZA) 24 MCG capsule Take 1 capsule (24 mcg total) by mouth 2 (two) times daily with a meal. 06/08/13  Yes Blanchie Serve, MD  Multiple Vitamin (MULTIVITAMIN WITH MINERALS) TABS Take 1 tablet by mouth every morning.    Yes [provider]  Multiple Vitamins-Minerals (EYE VITAMINS) CAPS Take 1 capsule by mouth every morning.   Yes [provider]  niacin 100 MG tablet Take 1 tablet (100 mg total) by mouth at bedtime. 07/02/19  Yes Thurnell Lose, MD  nitroGLYCERIN (NITROSTAT) 0.4 MG SL tablet Place 0.4 mg under the tongue every 5 (five) minutes as needed for chest pain.  09/03/12  Yes Charolette Forward, MD  NOVOFINE 32G X 6 MM MISC  01/25/13  Yes [provider]  pantoprazole (PROTONIX) 40 MG tablet Take 40 mg by mouth every  morning.  05/07/15  Yes [provider]  polyethylene glycol powder (GLYCOLAX/MIRALAX) powder TAKE 17 G BY MOUTH DAILY. HOLD FOR LOOSE STOOL Patient taking differently: Take 17 g by mouth daily as needed (constipation).  06/01/14  Yes Blanchie Serve, MD  valsartan-hydrochlorothiazide (DIOVAN-HCT) 320-12.5 MG tablet Take 1 tablet by mouth every morning.    Yes [provider]    Physical Exam: Vitals:   10/09/19 1555 10/09/19 1849  BP: 138/69   Pulse: 97   Resp: 18   Temp: 98.6 F (37 C) 98.8 F (37.1 C)  TempSrc: Oral Rectal  SpO2: 97%     Constitutional: NAD, calm, comfortable, thin elderly female.  Vitals:   10/09/19 1555 10/09/19 1849  BP: 138/69   Pulse: 97   Resp: 18   Temp: 98.6 F (37 C) 98.8 F (37.1 C)  TempSrc: Oral Rectal  SpO2: 97%      CVS: RRR, no carotid bruit CHEST: No signs of resp distress, on room air ABD: Soft, NTTP NEURO MENTAL STATUS: AAO to self and event but unable to recall time or current president, , fund of  knowledge appropriate LANG/SPEECH: Naming and repetition intact, fluent CRANIAL NERVES: II: Pupils equal and reactive III, IV, VI: EOM intact, no gaze preference or deviation, no nystagmus. V: normal sensation in V1, V2, and V3 segments bilaterally VII: no asymmetry VIII: normal hearing to speech IX, X: normal palatal elevation, no uvular deviation XI: 5/5 head turn and 5/5 shoulder shrug bilaterally XII: midline tongue protrusion MOTOR: Good hand grip. 4/5 strength of upper and lower extremities.    SENSORY: Normal to touch, temp all limbs No hemineglect Romberg absent COORD: Normal finger to nose and heel to shin, no tremor, no dysmetria STATION: deferred GAIT: deferred  Labs on Admission: I have personally reviewed following labs and imaging studies  CBC: Recent Labs  Lab 10/09/19 1719 10/09/19 1752  WBC 7.0  --   NEUTROABS 3.4  --   HGB 11.5* 12.2  HCT 35.7* 36.0  MCV 89.9  --   PLT 243  --    Basic Metabolic Panel: Recent Labs  Lab 10/09/19 1719 10/09/19 1752  NA 139 139  K 3.7 3.7  CL 103 101  CO2 29  --   GLUCOSE 124* 119*  BUN 15 16  CREATININE 1.24* 1.10*  CALCIUM 10.7*  --    GFR: CrCl cannot be calculated (Unknown ideal weight.). Liver Function Tests: Recent Labs  Lab 10/09/19 1719  AST 23  ALT 26  ALKPHOS 68  BILITOT 0.6  PROT 7.4  ALBUMIN 3.9   No results for input(s): LIPASE, AMYLASE in the last 168 hours. No results for input(s): AMMONIA in the last 168 hours. Coagulation Profile: Recent Labs  Lab 10/09/19 1719  INR 0.9   Cardiac Enzymes: No results for input(s): CKTOTAL, CKMB, CKMBINDEX, TROPONINI in the last 168 hours. BNP (last 3 results) No results for input(s): PROBNP in the last 8760 hours. HbA1C: No results for input(s): HGBA1C in the last 72 hours. CBG: Recent Labs  Lab 10/09/19 1754  GLUCAP 115*   Lipid Profile: No results for input(s): CHOL, HDL, LDLCALC, TRIG, CHOLHDL, LDLDIRECT in the last 72  hours. Thyroid Function Tests: No results for input(s): TSH, T4TOTAL, FREET4, T3FREE, THYROIDAB in the last 72 hours. Anemia Panel: No results for input(s): VITAMINB12, FOLATE, FERRITIN, TIBC, IRON, RETICCTPCT in the last 72 hours. Urine analysis:    Component Value Date/Time   COLORURINE STRAW (A) 10/09/2019 2052  APPEARANCEUR CLEAR 10/09/2019 2052   LABSPEC 1.004 (L) 10/09/2019 2052   PHURINE 6.0 10/09/2019 2052   GLUCOSEU NEGATIVE 10/09/2019 2052   HGBUR NEGATIVE 10/09/2019 2052   BILIRUBINUR NEGATIVE 10/09/2019 2052   KETONESUR NEGATIVE 10/09/2019 2052   PROTEINUR NEGATIVE 10/09/2019 2052   UROBILINOGEN 0.2 08/07/2015 1826   NITRITE NEGATIVE 10/09/2019 2052   LEUKOCYTESUR NEGATIVE 10/09/2019 2052    Radiological Exams on Admission: CT HEAD CODE STROKE WO CONTRAST  Result Date: 10/09/2019 CLINICAL DATA:  Code stroke.  Right-sided numbness and weakness. EXAM: CT HEAD WITHOUT CONTRAST TECHNIQUE: Contiguous axial images were obtained from the base of the skull through the vertex without intravenous contrast. COMPARISON:  07/01/2019 FINDINGS: Brain: There is no evidence of acute infarct, intracranial hemorrhage, mass, midline shift, or extra-axial fluid collection. Mild cerebral atrophy is within normal limits for age. Hypodensities in the cerebral white matter bilaterally are unchanged and nonspecific but compatible with chronic small vessel ischemic disease, mild for age. Vascular: Calcified atherosclerosis at the skull base. No hyperdense vessel. Skull: No fracture or suspicious osseous lesion. Sinuses/Orbits: Bilateral cataract extraction and glaucoma drainage devices. Visualized paranasal sinuses and mastoid air cells are clear. Other: None. ASPECTS [facility] Stroke Program Early CT Score) - Ganglionic level infarction (caudate, lentiform nuclei, internal capsule, insula, M1-M3 cortex): 7 - Supraganglionic infarction (M4-M6 cortex): 3 Total score (0-10 with 10 being normal): 10  IMPRESSION: 1. No evidence of acute intracranial abnormality. 2. ASPECTS is 10. 3. Mild chronic small vessel ischemic disease. These results were communicated to Dr. Lorraine Lax at 4:45 pm on 10/09/2019 by text page via the [facility] messaging system. Electronically Signed   By: Logan Bores M.D.   On: 10/09/2019 16:45    EKG: Independently reviewed.   Assessment/Plan  Left hand paresthesia - negative CT head - unable to obtain MRI brain due to pacemaker incompatibility - repeat CT head in 24 hrs - obtain CT C-spine, carotid ultrasound - continue daily aspirin, plavix and statin -Obtain A1c and lipids -PT/OT/SLT -Frequent neuro checks and keep on telemetry -Allow for permissive hypertension with blood pressure treatment as needed only if systolic goes above 269 - appreciate neurology recommendation   Complete heart block s/p pacemaker -stable  Type 2 diabetes - insulin dependent - Sensitive sliding scale  Hypertension - permissive HTN  - hold amlodipine, coreg, diovan   OSA -CPAP  GERD -continue PPI  DVT prophylaxis:.Lovenox Code Status: Full Family Communication: Plan discussed with patient and daughter at bedside  disposition Plan: Home with observation Consults called: Neurology Admission status: Observation  Worth Kober T Elijiah Mickley DO [facility]   If 7PM-7AM, please contact night-coverage [email or web address] Password [facility]  10/09/2019, 9:39 PM

## 2019-10-09 NOTE — ED Notes (Signed)
Pts daughter at bedside  
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
## 2019-10-09 NOTE — Progress Notes (Signed)
Attempted to contact RN twice - unsafe MRI [employer] pacemaker. Pacer documentation scanned into chart.

## 2019-10-09 NOTE — ED Notes (Signed)
Phil([facility]/Activate Code Stroke) called @ 1631-per RN called by Levada Dy

## 2019-10-09 NOTE — ED Triage Notes (Signed)
Per daughter family called EMS for Left hand pain radiating up into her Left arm. Symptoms started approx 1 pm. Daughter reports pt having shaking and trembling started around 2pm today, states she did the same when she had a stroke in the past. Pt with a slight left side facial droop, follows commands but is easily confused, pt makes an inaapropriate laughing chuckle at times, tremors when touched

## 2019-10-10 ENCOUNTER — Observation Stay (HOSPITAL_COMMUNITY): Payer: Medicare Other

## 2019-10-10 ENCOUNTER — Observation Stay (HOSPITAL_BASED_OUTPATIENT_CLINIC_OR_DEPARTMENT_OTHER): Payer: Medicare Other

## 2019-10-10 DIAGNOSIS — Z950 Presence of cardiac pacemaker: Secondary | ICD-10-CM | POA: Diagnosis present

## 2019-10-10 DIAGNOSIS — G459 Transient cerebral ischemic attack, unspecified: Secondary | ICD-10-CM | POA: Diagnosis not present

## 2019-10-10 DIAGNOSIS — I10 Essential (primary) hypertension: Secondary | ICD-10-CM | POA: Diagnosis not present

## 2019-10-10 DIAGNOSIS — K219 Gastro-esophageal reflux disease without esophagitis: Secondary | ICD-10-CM | POA: Diagnosis present

## 2019-10-10 DIAGNOSIS — R200 Anesthesia of skin: Secondary | ICD-10-CM | POA: Diagnosis present

## 2019-10-10 LAB — HEMOGLOBIN A1C
Hgb A1c MFr Bld: 8.1 % — ABNORMAL HIGH (ref 4.8–5.6)
Mean Plasma Glucose: 185.77 mg/dL

## 2019-10-10 LAB — CBG MONITORING, ED: Glucose-Capillary: 144 mg/dL — ABNORMAL HIGH (ref 70–99)

## 2019-10-10 LAB — LIPID PANEL
Cholesterol: 166 mg/dL (ref 0–200)
HDL: 53 mg/dL (ref >40)
LDL Cholesterol: 98 mg/dL (ref 0–99)
Total CHOL/HDL Ratio: 3.1 RATIO
Triglycerides: 74 mg/dL (ref <150)
VLDL: 15 mg/dL (ref 0–40)

## 2019-10-10 LAB — SARS CORONAVIRUS 2 (TAT 6-24 HRS): SARS Coronavirus 2: NEGATIVE

## 2019-10-10 MED ORDER — CALCITRIOL 0.25 MCG PO CAPS
0.2500 ug | ORAL_CAPSULE | Freq: Every morning | ORAL | Status: DC
  Filled 2019-10-10: qty 1

## 2019-10-10 MED ORDER — FERROUS SULFATE 325 (65 FE) MG PO TABS: 325.0000 mg | ORAL_TABLET | Freq: Every day | ORAL | Status: DC

## 2019-10-10 MED ORDER — ATORVASTATIN CALCIUM 80 MG PO TABS: 80.0000 mg | ORAL_TABLET | Freq: Every morning | ORAL | Status: DC

## 2019-10-10 MED ORDER — INSULIN ASPART 100 UNIT/ML ~~LOC~~ SOLN: 0.0000 [IU] | Freq: Three times a day (TID) | SUBCUTANEOUS | Status: DC

## 2019-10-10 MED ORDER — DOCUSATE SODIUM 100 MG PO CAPS: 100.0000 mg | ORAL_CAPSULE | Freq: Two times a day (BID) | ORAL | Status: DC

## 2019-10-10 MED ORDER — CLOPIDOGREL BISULFATE 75 MG PO TABS: 75.0000 mg | ORAL_TABLET | Freq: Every day | ORAL | Status: DC

## 2019-10-10 MED ORDER — LUBIPROSTONE 24 MCG PO CAPS
24.0000 ug | ORAL_CAPSULE | Freq: Two times a day (BID) | ORAL | Status: DC
  Filled 2019-10-10 (×2): qty 1

## 2019-10-10 MED ORDER — PANTOPRAZOLE SODIUM 40 MG PO TBEC: 40.0000 mg | DELAYED_RELEASE_TABLET | Freq: Every morning | ORAL | Status: DC

## 2019-10-10 NOTE — Discharge Summary (Signed)
Physician Discharge Summary  [name] JSR:[id number] DOB: Jan 08, 1928 DOA: 10/09/2019  PCP: [name], MD  Admit date: 10/09/2019 Discharge date: 10/10/2019  Time spent: 45 minutes  Recommendations for Outpatient Follow-up:  1. Follow up with PCP 1-2 weeks for evaluation of symptoms   Discharge Diagnoses:  Principal Problem:   TIA (transient ischemic attack) Active Problems:   Hypertension   Hand numbness   Type 2 diabetes mellitus ([place])   Pacemaker   Obstructive sleep apnea   GERD (gastroesophageal reflux disease)   Discharge Condition: Stable  Diet recommendation:  Heart healthy carb modified  There were no vitals filed for this visit.  History of present illness:  [name] 85 year old past medical history significant for CVA, CAD, hypertension, complete heart block status post pacemaker OSA on CPAP, insulin dependent Type 2 diabetes who presented 12/20 with concerns of left arm pain and numbness that started about 1pm. Patient noticed acute pain of her left hand that eventually radiated up her arm and to her back. Also had numbness and tingling of the hand. Granddaughter happened to call her and noticed that she had slurred speech and called EMS. Prior to EMS arrival, patient started to shake and cry uncontrollably. She was aware that this was happening. No urinary or bowel incontinence. On arrival at triage she was noted to have facial droop and a code stroke was called. CT head had no acute findings. She was unable to get MRI due to pacemaker incompatibility.   Hospital Course:  Left hand paresthesia. Patient reported symptoms resolved. Repeat CT head reveals Questionable new hypodensity versus artifact in the left lateral cerebellum. Unable to get MRI due to pacemaker. CT C-spine with multilevel degenerative disc disease and facet arthropathy. Facet hypertrophy at C2-C3 on the left has progressed with increased neural foraminal stenosis from 2014. Left C3-C4  facet hypertrophy with unchanged neural foraminal stenosis. No acute osseous abnormality. HgA1c 8.1 trending up since 06/2019. Lipid panel with LDL 98. Carotid dopplar Right Carotid: Velocities in the right ICA are consistent with a 1-39% stenosis. Left Carotid: Velocities in the left ICA are consistent with a 1-39% stenosis. Vertebrals: Bilateral vertebral arteries demonstrate antegrade flow.  Evaluated by neurology who opined no further work-up needed and patient could be discharged on current regimen.  Complete heart block s/p pacemaker -stable  Type 2 diabetes - insulin dependent HgA1c trending up at 8.1. home regimen includes Tresiba and humalog, tradjenta   Hypertension. Fair control. Home meds include norvasc and coreg as well as diovan   OSA -CPAP  GERD -continue PPI   Procedures: Consultations:  neurology  Discharge Exam: Vitals:   10/10/19 0600 10/10/19 1100  BP: (!) 143/56 (!) 152/72  Pulse: 73 80  Resp: 19 16  Temp: 98.3 F (36.8 C)   SpO2: 98% 99%    General: awake alert no acute distress Cardiovascular: rrr no mgr no LE edema Respiratory: normal effort BS clear bilaterally no wheeze  Discharge Instructions   Discharge Instructions    Call MD for:  persistant dizziness or light-headedness   Complete by: As directed    Call MD for:  temperature >100.4   Complete by: As directed    Diet - low sodium heart healthy   Complete by: As directed    Discharge instructions   Complete by: As directed    Take medications as prescribed.  Follow up with PCP in 1-2 weeks for evaluation of symptoms   Increase activity slowly   Complete by: As directed  Allergies as of 10/10/2019   No Known Allergies     Medication List    TAKE these medications   amLODipine 2.5 MG tablet Commonly known as: NORVASC Take 2.5 mg by mouth every morning.   atorvastatin 80 MG tablet Commonly known as: LIPITOR Take 80 mg by mouth every morning.   calcitRIOL 0.25  MCG capsule Commonly known as: ROCALTROL Take 0.25 mcg by mouth every morning.   carvedilol 3.125 MG tablet Commonly known as: COREG Take 3.125 mg by mouth 2 (two) times daily.   clopidogrel 75 MG tablet Commonly known as: PLAVIX Take 1 tablet (75 mg total) by mouth daily.   Cod Liver Oil Caps Take 1 capsule by mouth every morning.   Combigan 0.2-0.5 % ophthalmic solution Generic drug: brimonidine-timolol Place 1 drop into both eyes 2 (two) times daily.   dorzolamide 2 % ophthalmic solution Commonly known as: TRUSOPT Place 1 drop into both eyes 2 (two) times daily.   DSS 100 MG Caps Take 100 mg by mouth 2 (two) times daily.   Eye Vitamins Caps Take 1 capsule by mouth every morning.   ferrous sulfate 325 (65 FE) MG tablet Take 325 mg by mouth daily with breakfast.   HumaLOG KwikPen 200 UNIT/ML Sopn Generic drug: Insulin Lispro Inject 6 Units into the skin 3 (three) times daily as needed (high blood sugar).   latanoprost 0.005 % ophthalmic solution Commonly known as: XALATAN Appointment OVERDUE, place 1 drop into both eyes daily at bedtime What changed:   how much to take  how to take this  when to take this  additional instructions   linagliptin 5 MG Tabs tablet Commonly known as: TRADJENTA Take 5 mg by mouth every morning.   loperamide 2 MG capsule Commonly known as: IMODIUM Take 2 mg by mouth as needed for diarrhea or loose stools.   lubiprostone 24 MCG capsule Commonly known as: Amitiza Take 1 capsule (24 mcg total) by mouth 2 (two) times daily with a meal.   multivitamin with minerals Tabs tablet Take 1 tablet by mouth every morning.   niacin 100 MG tablet Take 1 tablet (100 mg total) by mouth at bedtime.   nitroGLYCERIN 0.4 MG SL tablet Commonly known as: NITROSTAT Place 0.4 mg under the tongue every 5 (five) minutes as needed for chest pain.   NovoFine 32G X 6 MM Misc Generic drug: Insulin Pen Needle   pantoprazole 40 MG tablet Commonly  known as: PROTONIX Take 40 mg by mouth every morning.   polyethylene glycol powder 17 GM/SCOOP powder Commonly known as: GLYCOLAX/MIRALAX TAKE 17 G BY MOUTH DAILY. HOLD FOR LOOSE STOOL What changed: See the new instructions.   Tyler Aas FlexTouch 100 UNIT/ML Sopn FlexTouch Pen Generic drug: insulin degludec Inject 10-20 Units into the skin at bedtime. Based on CBG   valsartan-hydrochlorothiazide 320-12.5 MG tablet Commonly known as: DIOVAN-HCT Take 1 tablet by mouth every morning.      No Known Allergies    The results of significant diagnostics from this hospitalization (including imaging, microbiology, ancillary and laboratory) are listed below for reference.    Significant Diagnostic Studies: CT HEAD WO CONTRAST  Result Date: 10/10/2019 CLINICAL DATA:  85 year old female status post code stroke presentation yesterday. Right side numbness and weakness. EXAM: CT HEAD WITHOUT CONTRAST TECHNIQUE: Contiguous axial images were obtained from the base of the skull through the vertex without intravenous contrast. COMPARISON:  Head CT 10/09/2019 and earlier. FINDINGS: Brain: No intracranial mass effect or ventriculomegaly. No acute intracranial  hemorrhage identified. Scattered bilateral cerebral white matter hypodensity appears stable with no acute or evolving supratentorial infarct identified. However, there is possible new abnormal hypodensity in the left lateral cerebellum (series 3, image 10), although this might be streak artifact. No posterior fossa hemorrhage or mass effect. Vascular: Calcified atherosclerosis at the skull base. No suspicious intracranial vascular hyperdensity. Skull: No acute osseous abnormality identified. Sinuses/Orbits: Visualized paranasal sinuses and mastoids are stable and well pneumatized. Other: No acute orbit or scalp soft tissue findings. IMPRESSION: 1. Questionable new hypodensity versus artifact in the left lateral cerebellum. Consider recent Left Cerebellar  Ischemia. Noncontrast Brain MRI would confirm. 2. Otherwise stable, with no other acute or evolving infarct identified. Electronically Signed   By: Genevie Ann M.D.   On: 10/10/2019 08:36   CT CERVICAL SPINE WO CONTRAST  Result Date: 10/10/2019 CLINICAL DATA:  Cervical radiculopathy. Neck pain. Hand numbness and tingling. EXAM: CT CERVICAL SPINE WITHOUT CONTRAST TECHNIQUE: Multidetector CT imaging of the cervical spine was performed without intravenous contrast. Multiplanar CT image reconstructions were also generated. COMPARISON:  CT cervical spine 03/09/2013 FINDINGS: Alignment: Trace anterolisthesis of C4 on C5, unchanged. Slight anterolisthesis of C5 on C6 has slightly increased from 2014. Skull base and vertebrae: Vertebral body heights are preserved. No acute fracture. Patient has remote history of C6 pedicle fracture that has healed with stable minimal residual posttraumatic deformity. Soft tissues and spinal canal: No prevertebral fluid or swelling. No visible canal hematoma. Disc levels: Disc space narrowing and endplate spurring at H0-Q6, slightly progressed from prior. There is multilevel facet hypertrophy. Partial bony ankylosis of C2-C3 on the left, degenerative and progressed from prior. Left neural foraminal stenosis at C3-C4, slightly progressed from prior. Mild left neural foraminal stenosis at C2-C3, also progressed. No significant canal stenosis. Upper chest: Large heterogeneous right thyroid nodule measures 3.9 cm, unchanged from neck CTA 06/21/2019. there has been previous right thyroid biopsy 08/24/2013. Other: Carotid calcifications. IMPRESSION: 1. Multilevel degenerative disc disease and facet arthropathy. 2. Facet hypertrophy at C2-C3 on the left has progressed with increased neural foraminal stenosis from 2014. Left C3-C4 facet hypertrophy with unchanged neural foraminal stenosis. 3. No acute osseous abnormality.  No high-grade canal stenosis. Electronically Signed   By: Keith Rake  M.D.   On: 10/10/2019 00:30   CT HEAD CODE STROKE WO CONTRAST  Result Date: 10/09/2019 CLINICAL DATA:  Code stroke.  Right-sided numbness and weakness. EXAM: CT HEAD WITHOUT CONTRAST TECHNIQUE: Contiguous axial images were obtained from the base of the skull through the vertex without intravenous contrast. COMPARISON:  07/01/2019 FINDINGS: Brain: There is no evidence of acute infarct, intracranial hemorrhage, mass, midline shift, or extra-axial fluid collection. Mild cerebral atrophy is within normal limits for age. Hypodensities in the cerebral white matter bilaterally are unchanged and nonspecific but compatible with chronic small vessel ischemic disease, mild for age. Vascular: Calcified atherosclerosis at the skull base. No hyperdense vessel. Skull: No fracture or suspicious osseous lesion. Sinuses/Orbits: Bilateral cataract extraction and glaucoma drainage devices. Visualized paranasal sinuses and mastoid air cells are clear. Other: None. ASPECTS [facility] Stroke Program Early CT Score) - Ganglionic level infarction (caudate, lentiform nuclei, internal capsule, insula, M1-M3 cortex): 7 - Supraganglionic infarction (M4-M6 cortex): 3 Total score (0-10 with 10 being normal): 10 IMPRESSION: 1. No evidence of acute intracranial abnormality. 2. ASPECTS is 10. 3. Mild chronic small vessel ischemic disease. These results were communicated to Dr. Lorraine Lax at 4:45 pm on 10/09/2019 by text page via the [facility] messaging system. Electronically Signed  By: Logan Bores M.D.   On: 10/09/2019 16:45   VAS US CAROTID (at [facility] and [facility] only)  Result Date: 10/10/2019 Carotid Arterial Duplex Study Indications:      TIA and hand numbness. Risk Factors:     Hypertension, hyperlipidemia, coronary artery disease, prior                   CVA. Comparison Study: 07/02/19 previous Performing Technologist: Abram Sander RVS  Examination Guidelines: A complete evaluation includes B-mode imaging, spectral Doppler, color Doppler, and power  Doppler as needed of all accessible portions of each vessel. Bilateral testing is considered an integral part of a complete examination. Limited examinations for reoccurring indications may be performed as noted.  Right Carotid Findings: +----------+--------+--------+--------+------------------+--------+           PSV cm/sEDV cm/sStenosisPlaque DescriptionComments +----------+--------+--------+--------+------------------+--------+ CCA Prox  55      12              heterogenous               +----------+--------+--------+--------+------------------+--------+ CCA Distal58      11              heterogenous               +----------+--------+--------+--------+------------------+--------+ ICA Prox  76      23      1-39%   heterogenous               +----------+--------+--------+--------+------------------+--------+ ICA Distal95      26                                         +----------+--------+--------+--------+------------------+--------+ ECA       75                                                 +----------+--------+--------+--------+------------------+--------+ +----------+--------+-------+--------+-------------------+           PSV cm/sEDV cmsDescribeArm Pressure (mmHG) +----------+--------+-------+--------+-------------------+ [id number]                                         +----------+--------+-------+--------+-------------------+ +---------+--------+--+--------+--+---------+ VertebralPSV cm/s45EDV cm/s11Antegrade +---------+--------+--+--------+--+---------+  Left Carotid Findings: +----------+--------+--------+--------+------------------+--------+           PSV cm/sEDV cm/sStenosisPlaque DescriptionComments +----------+--------+--------+--------+------------------+--------+ CCA Prox  100     17              heterogenous               +----------+--------+--------+--------+------------------+--------+ CCA Distal76      16               heterogenous               +----------+--------+--------+--------+------------------+--------+ ICA Prox  114     35      1-39%   heterogenous               +----------+--------+--------+--------+------------------+--------+ ICA Distal71      20                                         +----------+--------+--------+--------+------------------+--------+  ECA       65                                                 +----------+--------+--------+--------+------------------+--------+ +----------+--------+--------+--------+-------------------+           PSV cm/sEDV cm/sDescribeArm Pressure (mmHG) +----------+--------+--------+--------+-------------------+ [id number]                                          +----------+--------+--------+--------+-------------------+ +---------+--------+--+--------+-+---------+ VertebralPSV cm/s41EDV cm/s8Antegrade +---------+--------+--+--------+-+---------+  Summary: Right Carotid: Velocities in the right ICA are consistent with a 1-39% stenosis. Left Carotid: Velocities in the left ICA are consistent with a 1-39% stenosis. Vertebrals: Bilateral vertebral arteries demonstrate antegrade flow. *See table(s) above for measurements and observations.     Preliminary     Microbiology: Recent Results (from the past 240 hour(s))  SARS CORONAVIRUS 2 (TAT 6-24 HRS) Nasopharyngeal Nasopharyngeal Swab     Status: None   Collection Time: 10/09/19 10:05 PM   Specimen: Nasopharyngeal Swab  Result Value Ref Range Status   SARS Coronavirus 2 NEGATIVE NEGATIVE Final    Comment: (NOTE) SARS-CoV-2 target nucleic acids are NOT DETECTED. The SARS-CoV-2 RNA is generally detectable in upper and lower respiratory specimens during the acute phase of infection. Negative results do not preclude SARS-CoV-2 infection, do not rule out co-infections with other pathogens, and should not be used as the sole basis for treatment or other patient management  decisions. Negative results must be combined with clinical observations, patient history, and epidemiological information. The expected result is Negative. Fact Sheet for Patients: SugarRoll.be Fact Sheet for Healthcare Providers: https://www.woods-mathews.com/ This test is not yet approved or cleared by the [place] FDA and  has been authorized for detection and/or diagnosis of SARS-CoV-2 by FDA under an Emergency Use Authorization (EUA). This EUA will remain  in effect (meaning this test can be used) for the duration of the COVID-19 declaration under Section 56 4(b)(1) of the Act, 21 U.S.C. section 360bbb-3(b)(1), unless the authorization is terminated or revoked sooner. Performed at [facility], [place] [address] [place], [place] [address]      Labs: Basic Metabolic Panel: Recent Labs  Lab 10/09/19 1719 10/09/19 1752  NA 139 139  K 3.7 3.7  CL 103 101  CO2 29  --   GLUCOSE 124* 119*  BUN 15 16  CREATININE 1.24* 1.10*  CALCIUM 10.7*  --    Liver Function Tests: Recent Labs  Lab 10/09/19 1719  AST 23  ALT 26  ALKPHOS 68  BILITOT 0.6  PROT 7.4  ALBUMIN 3.9   No results for input(s): LIPASE, AMYLASE in the last 168 hours. No results for input(s): AMMONIA in the last 168 hours. CBC: Recent Labs  Lab 10/09/19 1719 10/09/19 1752  WBC 7.0  --   NEUTROABS 3.4  --   HGB 11.5* 12.2  HCT 35.7* 36.0  MCV 89.9  --   PLT 243  --    Cardiac Enzymes: No results for input(s): CKTOTAL, CKMB, CKMBINDEX, TROPONINI in the last 168 hours. BNP: BNP (last 3 results) No results for input(s): BNP in the last 8760 hours.  ProBNP (last 3 results) No results for input(s): PROBNP in the last 8760 hours.  CBG: Recent Labs  Lab 10/09/19 1754 10/10/19  [place]       Signed:  Radene Gunning NP [facility] 10/10/2019, 11:41 AM

## 2019-10-10 NOTE — Evaluation (Signed)
Physical Therapy Evaluation Patient Details Name: [name] MRN: [id number] DOB: [birth year] Today's Date: 10/10/2019   History of Present Illness  [name] is a 85 y.o. female with a Past Medical History of CVA, CAD, hypertension, complete heart block s/p pacemaker, OSA on CPAP, insulin dependent Type 2 diabetes who presents with left hand and arm pain.  CT head was negative x 2, unable to undergo MRI due to pacemaker incompatability.  Clinical Impression  Patient presents with mobility close to her functional baseline.  Will have assistance from aides and daughter at home.  No further skilled PT needs at this time.  Will sign off.     Follow Up Recommendations No PT follow up    Equipment Recommendations  None recommended by PT    Recommendations for Other Services       Precautions / Restrictions Precautions Precautions: Fall      Mobility  Bed Mobility Overal bed mobility: Needs Assistance Bed Mobility: Supine to Sit     Supine to sit: Supervision;HOB elevated     General bed mobility comments: unplugging lines as she rose to sit due to pending d/c  Transfers Overall transfer level: Needs assistance Equipment used: None Transfers: Sit to/from Stand Sit to Stand: Min guard         General transfer comment: for balance as assisting to don clothes for pending d/c and pants around her legs; pt pulled up pants with minguard for balance  Ambulation/Gait Ambulation/Gait assistance: Supervision;Min guard Gait Distance (Feet): 70 Feet Assistive device: Straight cane Gait Pattern/deviations: Step-through pattern;Decreased stride length;Drifts right/left     General Gait Details: reports symptoms were on her L side, leading with L as she walked around nurses desk; reports "that's as far as I normally walk" and sat in w/c with RN ready to push out for d/c.  Stairs            Wheelchair Mobility    Modified Rankin (Stroke Patients  Only) Modified Rankin (Stroke Patients Only) Pre-Morbid Rankin Score: Moderate disability Modified Rankin: Moderately severe disability     Balance Overall balance assessment: Needs assistance   Sitting balance-Leahy Scale: Good       Standing balance-Leahy Scale: Fair                               Pertinent Vitals/Pain Pain Assessment: No/denies pain    Home Living Family/patient expects to be discharged to:: Private residence Living Arrangements: Children Available Help at Discharge: Family;Personal care attendant;Available 24 hours/day Type of Home: House Home Access: Level entry     Home Layout: One level Home Equipment: Cane - single point      Prior Function Level of Independence: Needs assistance   Gait / Transfers Assistance Needed: pt reports use of SPC for mobility  ADL's / Homemaking Assistance Needed: pt's daughter assists with bathing  Comments: as an aide till 1pm, daughter home from work at [employer], states to get another aide to help till she is home     Journalist, newspaper        Extremity/Trunk Assessment   Upper Extremity Assessment Upper Extremity Assessment: Overall WFL for tasks assessed    Lower Extremity Assessment Lower Extremity Assessment: Generalized weakness       Communication   Communication: HOH  Cognition Arousal/Alertness: Awake/alert Behavior During Therapy: WFL for tasks assessed/performed Overall Cognitive Status: History of cognitive impairments - at baseline  General Comments      Exercises     Assessment/Plan    PT Assessment Patent does not need any further PT services  PT Problem List         PT Treatment Interventions      PT Goals (Current goals can be found in the Care Plan section)  Acute Rehab PT Goals PT Goal Formulation: All assessment and education complete, DC therapy    Frequency     Barriers to discharge         Co-evaluation               AM-PAC PT "6 Clicks" Mobility  Outcome Measure Help needed turning from your back to your side while in a flat bed without using bedrails?: None Help needed moving from lying on your back to sitting on the side of a flat bed without using bedrails?: A Little Help needed moving to and from a bed to a chair (including a wheelchair)?: A Little Help needed standing up from a chair using your arms (e.g., wheelchair or bedside chair)?: A Little Help needed to walk in hospital room?: A Little Help needed climbing 3-5 steps with a railing? : A Little 6 Click Score: 19    End of Session   Activity Tolerance: Patient tolerated treatment well Patient left: Other (comment)(in w/c for d/c)   PT Visit Diagnosis: Other abnormalities of gait and mobility (R26.89)    Time: 1212-1222 PT Time Calculation (min) (ACUTE ONLY): 10 min   Charges:   PT Evaluation $PT Eval Low Complexity: [place], [place] Acute Rehabilitation Services [phone] 10/10/2019   Reginia Naas 10/10/2019, 2:21 PM

## 2019-10-10 NOTE — ED Notes (Signed)
This RN messaged pharmacy to verify/send medications

## 2019-10-10 NOTE — Progress Notes (Signed)
Carotid duplex has been completed.   Preliminary results in CV Proc.   Abram Sander 10/10/2019 11:18 AM

## 2019-10-10 NOTE — Progress Notes (Addendum)
STROKE TEAM PROGRESS NOTE   HISTORY OF PRESENT ILLNESS (per record) [name] is an 85 y.o. female  With PMH stroke, HTN, HLD, DM, CHF who presented to [facility] ED via PV with c/o left hand pain and then some numbness/tingling.   INTERVAL HISTORY Per pts report today, he complaint of distal right hand/arm pain is not c/w any stroke syndrome. No further wk up indicated at this time. Primary team called and updated.  I personally reviewed history of presenting illness with the patient in details, electronic medical records and imaging films in PACS  OBJECTIVE Vitals:   10/10/19 0500 10/10/19 0600 10/10/19 1100 10/10/19 1200  BP: (!) 143/56 (!) 143/56 (!) 152/72 (!) 146/62  Pulse: 74 73 80 72  Resp: (!) 29 19 16 16   Temp:  98.3 F (36.8 C)    TempSrc:  Oral    SpO2: 97% 98% 99% 97%    CBC:  Recent Labs  Lab 10/09/19 1719 10/09/19 1752  WBC 7.0  --   NEUTROABS 3.4  --   HGB 11.5* 12.2  HCT 35.7* 36.0  MCV 89.9  --   PLT 243  --     Basic Metabolic Panel:  Recent Labs  Lab 10/09/19 1719 10/09/19 1752  NA 139 139  K 3.7 3.7  CL 103 101  CO2 29  --   GLUCOSE 124* 119*  BUN 15 16  CREATININE 1.24* 1.10*  CALCIUM 10.7*  --     Lipid Panel:     Component Value Date/Time   CHOL 166 10/10/2019 0355   CHOL 194 03/28/2013 1021   TRIG 74 10/10/2019 0355   HDL 53 10/10/2019 0355   HDL 44 03/28/2013 1021   CHOLHDL 3.1 10/10/2019 0355   VLDL 15 10/10/2019 0355   LDLCALC 98 10/10/2019 0355   LDLCALC 125 (H) 03/28/2013 1021   HgbA1c:  Lab Results  Component Value Date   HGBA1C 8.1 (H) 10/10/2019   Urine Drug Screen:     Component Value Date/Time   LABOPIA NONE DETECTED 10/09/2019 2052   COCAINSCRNUR NONE DETECTED 10/09/2019 2052   LABBENZ NONE DETECTED 10/09/2019 2052   AMPHETMU NONE DETECTED 10/09/2019 2052   THCU NONE DETECTED 10/09/2019 2052   LABBARB NONE DETECTED 10/09/2019 2052    Alcohol Level     Component Value Date/Time   ETH <10 10/09/2019 1719     IMAGING   CT HEAD WO CONTRAST  Result Date: 10/10/2019 CLINICAL DATA:  85 year old female status post code stroke presentation yesterday. Right side numbness and weakness. EXAM: CT HEAD WITHOUT CONTRAST TECHNIQUE: Contiguous axial images were obtained from the base of the skull through the vertex without intravenous contrast. COMPARISON:  Head CT 10/09/2019 and earlier. FINDINGS: Brain: No intracranial mass effect or ventriculomegaly. No acute intracranial hemorrhage identified. Scattered bilateral cerebral white matter hypodensity appears stable with no acute or evolving supratentorial infarct identified. However, there is possible new abnormal hypodensity in the left lateral cerebellum (series 3, image 10), although this might be streak artifact. No posterior fossa hemorrhage or mass effect. Vascular: Calcified atherosclerosis at the skull base. No suspicious intracranial vascular hyperdensity. Skull: No acute osseous abnormality identified. Sinuses/Orbits: Visualized paranasal sinuses and mastoids are stable and well pneumatized. Other: No acute orbit or scalp soft tissue findings. IMPRESSION: 1. Questionable new hypodensity versus artifact in the left lateral cerebellum. Consider recent Left Cerebellar Ischemia. Noncontrast Brain MRI would confirm. 2. Otherwise stable, with no other acute or evolving infarct identified. Electronically Signed  By: Genevie Ann M.D.   On: 10/10/2019 08:36   CT CERVICAL SPINE WO CONTRAST  Result Date: 10/10/2019 CLINICAL DATA:  Cervical radiculopathy. Neck pain. Hand numbness and tingling. EXAM: CT CERVICAL SPINE WITHOUT CONTRAST TECHNIQUE: Multidetector CT imaging of the cervical spine was performed without intravenous contrast. Multiplanar CT image reconstructions were also generated. COMPARISON:  CT cervical spine 03/09/2013 FINDINGS: Alignment: Trace anterolisthesis of C4 on C5, unchanged. Slight anterolisthesis of C5 on C6 has slightly increased from 2014. Skull  base and vertebrae: Vertebral body heights are preserved. No acute fracture. Patient has remote history of C6 pedicle fracture that has healed with stable minimal residual posttraumatic deformity. Soft tissues and spinal canal: No prevertebral fluid or swelling. No visible canal hematoma. Disc levels: Disc space narrowing and endplate spurring at J0-L2, slightly progressed from prior. There is multilevel facet hypertrophy. Partial bony ankylosis of C2-C3 on the left, degenerative and progressed from prior. Left neural foraminal stenosis at C3-C4, slightly progressed from prior. Mild left neural foraminal stenosis at C2-C3, also progressed. No significant canal stenosis. Upper chest: Large heterogeneous right thyroid nodule measures 3.9 cm, unchanged from neck CTA 06/21/2019. there has been previous right thyroid biopsy 08/24/2013. Other: Carotid calcifications. IMPRESSION: 1. Multilevel degenerative disc disease and facet arthropathy. 2. Facet hypertrophy at C2-C3 on the left has progressed with increased neural foraminal stenosis from 2014. Left C3-C4 facet hypertrophy with unchanged neural foraminal stenosis. 3. No acute osseous abnormality.  No high-grade canal stenosis. Electronically Signed   By: Keith Rake M.D.   On: 10/10/2019 00:30   CT HEAD CODE STROKE WO CONTRAST  Result Date: 10/09/2019 CLINICAL DATA:  Code stroke.  Right-sided numbness and weakness. EXAM: CT HEAD WITHOUT CONTRAST TECHNIQUE: Contiguous axial images were obtained from the base of the skull through the vertex without intravenous contrast. COMPARISON:  07/01/2019 FINDINGS: Brain: There is no evidence of acute infarct, intracranial hemorrhage, mass, midline shift, or extra-axial fluid collection. Mild cerebral atrophy is within normal limits for age. Hypodensities in the cerebral white matter bilaterally are unchanged and nonspecific but compatible with chronic small vessel ischemic disease, mild for age. Vascular: Calcified  atherosclerosis at the skull base. No hyperdense vessel. Skull: No fracture or suspicious osseous lesion. Sinuses/Orbits: Bilateral cataract extraction and glaucoma drainage devices. Visualized paranasal sinuses and mastoid air cells are clear. Other: None. ASPECTS [facility] Stroke Program Early CT Score) - Ganglionic level infarction (caudate, lentiform nuclei, internal capsule, insula, M1-M3 cortex): 7 - Supraganglionic infarction (M4-M6 cortex): 3 Total score (0-10 with 10 being normal): 10 IMPRESSION: 1. No evidence of acute intracranial abnormality. 2. ASPECTS is 10. 3. Mild chronic small vessel ischemic disease. These results were communicated to Dr. Lorraine Lax at 4:45 pm on 10/09/2019 by text page via the [facility] messaging system. Electronically Signed   By: Logan Bores M.D.   On: 10/09/2019 16:45   VAS US CAROTID (at [facility] and [facility] only)  Result Date: 10/10/2019 Carotid Arterial Duplex Study Indications:      TIA and hand numbness. Risk Factors:     Hypertension, hyperlipidemia, coronary artery disease, prior                   CVA. Comparison Study: 07/02/19 previous Performing Technologist: Abram Sander RVS  Examination Guidelines: A complete evaluation includes B-mode imaging, spectral Doppler, color Doppler, and power Doppler as needed of all accessible portions of each vessel. Bilateral testing is considered an integral part of a complete examination. Limited examinations for reoccurring indications may be  performed as noted.  Right Carotid Findings: +----------+--------+--------+--------+------------------+--------+           PSV cm/sEDV cm/sStenosisPlaque DescriptionComments +----------+--------+--------+--------+------------------+--------+ CCA Prox  55      12              heterogenous               +----------+--------+--------+--------+------------------+--------+ CCA Distal58      11              heterogenous                +----------+--------+--------+--------+------------------+--------+ ICA Prox  76      23      1-39%   heterogenous               +----------+--------+--------+--------+------------------+--------+ ICA Distal95      26                                         +----------+--------+--------+--------+------------------+--------+ ECA       75                                                 +----------+--------+--------+--------+------------------+--------+ +----------+--------+-------+--------+-------------------+           PSV cm/sEDV cmsDescribeArm Pressure (mmHG) +----------+--------+-------+--------+-------------------+ [id number]                                         +----------+--------+-------+--------+-------------------+ +---------+--------+--+--------+--+---------+ VertebralPSV cm/s45EDV cm/s11Antegrade +---------+--------+--+--------+--+---------+  Left Carotid Findings: +----------+--------+--------+--------+------------------+--------+           PSV cm/sEDV cm/sStenosisPlaque DescriptionComments +----------+--------+--------+--------+------------------+--------+ CCA Prox  100     17              heterogenous               +----------+--------+--------+--------+------------------+--------+ CCA Distal76      16              heterogenous               +----------+--------+--------+--------+------------------+--------+ ICA Prox  114     35      1-39%   heterogenous               +----------+--------+--------+--------+------------------+--------+ ICA Distal71      20                                         +----------+--------+--------+--------+------------------+--------+ ECA       65                                                 +----------+--------+--------+--------+------------------+--------+ +----------+--------+--------+--------+-------------------+           PSV cm/sEDV cm/sDescribeArm Pressure (mmHG)  +----------+--------+--------+--------+-------------------+ [id number]                                          +----------+--------+--------+--------+-------------------+ +---------+--------+--+--------+-+---------+  VertebralPSV cm/s41EDV cm/s8Antegrade +---------+--------+--+--------+-+---------+  Summary: Right Carotid: Velocities in the right ICA are consistent with a 1-39% stenosis. Left Carotid: Velocities in the left ICA are consistent with a 1-39% stenosis. Vertebrals: Bilateral vertebral arteries demonstrate antegrade flow. *See table(s) above for measurements and observations.     Preliminary    PHYSICAL EXAM Blood pressure (!) 146/62, pulse 72, temperature 98.3 F (36.8 C), temperature source Oral, resp. rate 16, SpO2 97 %. Physical Exam  Constitutional: Appears well-developed and well-nourished.  Psych: Affect appropriate to situation Eyes: Normal external eye and conjunctiva. HENT: Normocephalic, no lesions, without obvious abnormality.   Musculoskeletal-no joint tenderness, deformity or swelling Cardiovascular: Normal rate and regular rhythm.  Respiratory: Effort normal, non-labored breathing saturations WNL GI: Soft.  No distension. There is no tenderness.  Skin: WDI  Neurological Examination Mental Status: Alert, oriented, thought content appropriate.  Speech fluent without evidence of aphasia.  Able to follow commands without difficulty. Cranial Nerves: II:  Visual fields grossly normal,  III,IV, VI: ptosis not present, extra-ocular motions intact bilaterally, pupils equal, round, reactive to light and accommodation V,VII: smile symmetric, facial light touch sensation normal bilaterally VIII: hearing normal bilaterally IX,X: uvula rises midline XI: bilateral shoulder shrug XII: midline tongue extension Motor: Right :  Upper extremity   5/5              Left:     Upper extremity   5/5             Lower extremity   5/5                          Lower  extremity   5/5 Tone and bulk:normal tone throughout; no atrophy noted Sensory:  light touch intact throughout, bilaterally Cerebellar: No ataxia noted Gait: deferred  HOME MEDICATIONS:  (Not in a hospital admission)   HOSPITAL MEDICATIONS:  .  stroke: mapping our early stages of recovery book   Does not apply Once  . atorvastatin  80 mg Oral q morning - 10a  . calcitRIOL  0.25 mcg Oral q morning - 10a  . clopidogrel  75 mg Oral Daily  . docusate sodium  100 mg Oral BID  . enoxaparin (LOVENOX) injection  40 mg Subcutaneous Q24H  . ferrous sulfate  325 mg Oral Q breakfast  . insulin aspart  0-9 Units Subcutaneous TID WC  . lubiprostone  24 mcg Oral BID WC  . pantoprazole  40 mg Oral q morning - 10a    ALLERGIES No Known Allergies  ASSESSMENT/PLAN [name] is an 85 y.o. female  With PMH stroke, HTN, HLD, DM, Complete heart block ( with pacemaker) who presented to [facility] ED via PV with c/o left hand numbness/tingling. The [place] did not show a hemorrhage. Would obtain MRI to r/o stroke. Given exam and description TIA vs stroke vs radiating arm pain. Doubt TIA: Likely mechanical left hand pain  Resultant  left hard/arm tinging, but this was assocated w/pain which is highly atypical  Code Stroke CT Head -  n/a  CT head - neg for acute findings  MRI head- unable d/t pacer  MRA head - unable d/t pacer  CTA H&N - n/a  CT Perfusion-n/a  Carotid Doppler - n/a  2D Echo - pending  Sars Corona Virus 2  neg  LDL - 98    Component Value Date/Time   LDLCALC 98 10/10/2019 0355   LDLCALC 125 (H) 03/28/2013 1021  HgbA1c - 8.1  UDS neg  VTE prophylaxis - Lovenox Diet  Diet Order            Diet Heart Room service appropriate? Yes; Fluid consistency: Thin  Diet effective now        Diet - low sodium heart healthy               ASA + Plavix prior to admission, now on ASA + Plavix  Patient counseled to be compliant with her antithrombotic  medications  Ongoing aggressive stroke risk factor management  Therapy recommendations:  pending  Disposition:  Pending  Hypertension  Home BP meds: norvasc, coreg, Nitrostat, Diovon  Current BP meds: holding per primary team  Stable . Long-term BP goal normotensive  Hyperlipidemia  Home Lipid lowering medication: max dose Lipitor already  LDL 98, goal < 70  Current lipid lowering medication: 30m Lipitor  Continue statin at discharge  Diabetes  Home diabetic meds: Humalong, Tradjenta, Amitiza  Current diabetic meds: SSI , Amitiza  HgbA1c 8.1, goal < 7.0 Recent Labs    10/09/19 1754 10/10/19 0844  GLUCAP 115* 144*     Other Stroke Risk Factors  Advanced age  Hx stroke/TIA   Hospital day # 0  Desiree Metzger-Cihelka, ARNP-C, ANVP-BC Pager: [phone] I have personally obtained history,examined this patient, reviewed notes, independently viewed imaging studies, participated in medical decision making and plan of care.ROS completed by me personally and pertinent positives fully documented  I have made any additions or clarifications directly to the above note. Agree with note above.  Patient is a poor historian she presented with sudden onset of left hand pain with some tingling and clinical presentation is not suggestive of a TIA or stroke.  CT scan of the head was unremarkable and she has a pacemaker and cannot obtain an MRI.  I do not believe further stroke work-up is necessary.  Discussed with Dr. REinar Grad  Greater than 50% time during this 25-minute visit was spent on counseling and coordination of care about her presentation and answering questions  PAntony Contras MChicopeePager: [phone]/21/2020 4:37 PM  To contact Stroke Continuity provider, please refer to [email or web address] After hours, contact General Neurology

## 2019-10-10 NOTE — ED Notes (Signed)
Pt aware she is being discharged, stated provider has already spoke with her.  No further questions.  This nurse called daughter Maxwell Marion to come pick pt up. Discharge papers printed.

## 2019-10-10 NOTE — ED Notes (Signed)
PT at bedside, assessing pt for discharge.

## 2019-10-10 NOTE — ED Notes (Addendum)
Santiago Glad, NP @ [facility] called to advise pt can be d/c per Dr. Willaim Rayas.  Santiago Glad, will confirm.  Once confirmed Santiago Glad, NP will put order in to discharge and discharge papers can be printed.

## 2019-10-10 NOTE — Progress Notes (Deleted)
[facility] PROGRESS NOTE  [name]:[id number] DOB: [birth year] DOA: 10/09/2019 PCP: [name], MD  Assessment/Plan: Left hand paresthesia. Patient reports symptoms resolved but appears to have some word searching during interview/exam. Repeat CT head reveals Questionable new hypodensity versus artifact in the left lateral cerebellum. Unable to get MRI due to pacemaker. CT C-spine with multilevel degenerative disc disease and facet arthropathy. Facet hypertrophy at C2-C3 on the left has progressed with increased neural foraminal stenosis from 2014. Left C3-C4 facet hypertrophy with unchanged neural foraminal stenosis. No acute osseous abnormality. HgA1c 8.1 trending up since 06/2019. Lipid panel with LDL 98. -follow carotid ultrasound, of note CTA head and neck 9/20 Negative for large vessel occlusion but positive for multiple hemodynamically significant stenoses: Severe Right ICA siphon stenosis due to calcified plaque Moderate contralateral left ICA siphon stenosis due to calcified plaque. 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 Moderate stenosis of both distal vertebral arteries as they cross the dura due to calcified plaque. Was supposed to follow up with vascular OP but unclear if that has happened.  - continue daily aspirin, plavix and statin -PT/OT/SLT -Frequent neuro checks and keep on telemetry -Allow for permissive hypertension with blood pressure treatment as needed only if systolic goes above 811 - await neurology recommendation   Complete heart block s/p pacemaker -stable  Type 2 diabetes - insulin dependent HgA1c trending up at 8.1. home regimen includes Tresiba and humalog, tradjenta - Sensitive sliding scale  Hypertension. Fair control. Home meds include norvasc and coreg as well as diovan - permissive HTN  - continue to hold amlodipine, coreg, diovan   OSA -CPAP  GERD -continue PPI  DVT prophylaxis:.Lovenox Code Status:  Full   Code Status: full Family Communication: attempted to call daughter but no answer and no opportunity to leave message. Disposition Plan: home when ready   Consultants:  aroor neurollgy  Procedures:    Antibiotics:    HPI/Subjective: Awake alert, denies pain/discomfort. Reports symptoms "have all gone away"  Objective: Vitals:   10/10/19 0500 10/10/19 0600  BP: (!) 143/56 (!) 143/56  Pulse: 74 73  Resp: (!) 29 19  Temp:  98.3 F (36.8 C)  SpO2: 97% 98%   No intake or output data in the 24 hours ending 10/10/19 0956 There were no vitals filed for this visit.  Exam:   General:  Awake alert no acute distress  Cardiovascular: rrr no mgr no LE edema  Respiratory: normal effort BS clear bilaterally no wheeze  Abdomen: obese soft +BS no guarding or rebounding  Musculoskeletal: joints without swelling/erythema  Neuro: alert and oriented x3. Left grip 5/5. Speech clear tongue midline but appears to have some word searching.    Data Reviewed: Basic Metabolic Panel: Recent Labs  Lab 10/09/19 1719 10/09/19 1752  NA 139 139  K 3.7 3.7  CL 103 101  CO2 29  --   GLUCOSE 124* 119*  BUN 15 16  CREATININE 1.24* 1.10*  CALCIUM 10.7*  --    Liver Function Tests: Recent Labs  Lab 10/09/19 1719  AST 23  ALT 26  ALKPHOS 68  BILITOT 0.6  PROT 7.4  ALBUMIN 3.9   No results for input(s): LIPASE, AMYLASE in the last 168 hours. No results for input(s): AMMONIA in the last 168 hours. CBC: Recent Labs  Lab 10/09/19 1719 10/09/19 1752  WBC 7.0  --   NEUTROABS 3.4  --   HGB 11.5* 12.2  HCT 35.7* 36.0  MCV 89.9  --  PLT 243  --    Cardiac Enzymes: No results for input(s): CKTOTAL, CKMB, CKMBINDEX, TROPONINI in the last 168 hours. BNP (last 3 results) No results for input(s): BNP in the last 8760 hours.  ProBNP (last 3 results) No results for input(s): PROBNP in the last 8760 hours.  CBG: Recent Labs  Lab 10/09/19 1754 10/10/19 0844  GLUCAP  115* 144*    Recent Results (from the past 240 hour(s))  SARS CORONAVIRUS 2 (TAT 6-24 HRS) Nasopharyngeal Nasopharyngeal Swab     Status: None   Collection Time: 10/09/19 10:05 PM   Specimen: Nasopharyngeal Swab  Result Value Ref Range Status   SARS Coronavirus 2 NEGATIVE NEGATIVE Final    Comment: (NOTE) SARS-CoV-2 target nucleic acids are NOT DETECTED. The SARS-CoV-2 RNA is generally detectable in upper and lower respiratory specimens during the acute phase of infection. Negative results do not preclude SARS-CoV-2 infection, do not rule out co-infections with other pathogens, and should not be used as the sole basis for treatment or other patient management decisions. Negative results must be combined with clinical observations, patient history, and epidemiological information. The expected result is Negative. Fact Sheet for Patients: SugarRoll.be Fact Sheet for Healthcare Providers: https://www.woods-mathews.com/ This test is not yet approved or cleared by the [place] FDA and  has been authorized for detection and/or diagnosis of SARS-CoV-2 by FDA under an Emergency Use Authorization (EUA). This EUA will remain  in effect (meaning this test can be used) for the duration of the COVID-19 declaration under Section 56 4(b)(1) of the Act, 21 U.S.C. section 360bbb-3(b)(1), unless the authorization is terminated or revoked sooner. Performed at [facility], [place] [address] [place], [place] [address]      Studies: CT HEAD WO CONTRAST  Result Date: 10/10/2019 CLINICAL DATA:  85 year old female status post code stroke presentation yesterday. Right side numbness and weakness. EXAM: CT HEAD WITHOUT CONTRAST TECHNIQUE: Contiguous axial images were obtained from the base of the skull through the vertex without intravenous contrast. COMPARISON:  Head CT 10/09/2019 and earlier. FINDINGS: Brain: No intracranial mass effect or  ventriculomegaly. No acute intracranial hemorrhage identified. Scattered bilateral cerebral white matter hypodensity appears stable with no acute or evolving supratentorial infarct identified. However, there is possible new abnormal hypodensity in the left lateral cerebellum (series 3, image 10), although this might be streak artifact. No posterior fossa hemorrhage or mass effect. Vascular: Calcified atherosclerosis at the skull base. No suspicious intracranial vascular hyperdensity. Skull: No acute osseous abnormality identified. Sinuses/Orbits: Visualized paranasal sinuses and mastoids are stable and well pneumatized. Other: No acute orbit or scalp soft tissue findings. IMPRESSION: 1. Questionable new hypodensity versus artifact in the left lateral cerebellum. Consider recent Left Cerebellar Ischemia. Noncontrast Brain MRI would confirm. 2. Otherwise stable, with no other acute or evolving infarct identified. Electronically Signed   By: Genevie Ann M.D.   On: 10/10/2019 08:36   CT CERVICAL SPINE WO CONTRAST  Result Date: 10/10/2019 CLINICAL DATA:  Cervical radiculopathy. Neck pain. Hand numbness and tingling. EXAM: CT CERVICAL SPINE WITHOUT CONTRAST TECHNIQUE: Multidetector CT imaging of the cervical spine was performed without intravenous contrast. Multiplanar CT image reconstructions were also generated. COMPARISON:  CT cervical spine 03/09/2013 FINDINGS: Alignment: Trace anterolisthesis of C4 on C5, unchanged. Slight anterolisthesis of C5 on C6 has slightly increased from 2014. Skull base and vertebrae: Vertebral body heights are preserved. No acute fracture. Patient has remote history of C6 pedicle fracture that has healed with stable minimal residual posttraumatic deformity. Soft  tissues and spinal canal: No prevertebral fluid or swelling. No visible canal hematoma. Disc levels: Disc space narrowing and endplate spurring at J6-O1, slightly progressed from prior. There is multilevel facet hypertrophy. Partial  bony ankylosis of C2-C3 on the left, degenerative and progressed from prior. Left neural foraminal stenosis at C3-C4, slightly progressed from prior. Mild left neural foraminal stenosis at C2-C3, also progressed. No significant canal stenosis. Upper chest: Large heterogeneous right thyroid nodule measures 3.9 cm, unchanged from neck CTA 06/21/2019. there has been previous right thyroid biopsy 08/24/2013. Other: Carotid calcifications. IMPRESSION: 1. Multilevel degenerative disc disease and facet arthropathy. 2. Facet hypertrophy at C2-C3 on the left has progressed with increased neural foraminal stenosis from 2014. Left C3-C4 facet hypertrophy with unchanged neural foraminal stenosis. 3. No acute osseous abnormality.  No high-grade canal stenosis. Electronically Signed   By: Keith Rake M.D.   On: 10/10/2019 00:30   CT HEAD CODE STROKE WO CONTRAST  Result Date: 10/09/2019 CLINICAL DATA:  Code stroke.  Right-sided numbness and weakness. EXAM: CT HEAD WITHOUT CONTRAST TECHNIQUE: Contiguous axial images were obtained from the base of the skull through the vertex without intravenous contrast. COMPARISON:  07/01/2019 FINDINGS: Brain: There is no evidence of acute infarct, intracranial hemorrhage, mass, midline shift, or extra-axial fluid collection. Mild cerebral atrophy is within normal limits for age. Hypodensities in the cerebral white matter bilaterally are unchanged and nonspecific but compatible with chronic small vessel ischemic disease, mild for age. Vascular: Calcified atherosclerosis at the skull base. No hyperdense vessel. Skull: No fracture or suspicious osseous lesion. Sinuses/Orbits: Bilateral cataract extraction and glaucoma drainage devices. Visualized paranasal sinuses and mastoid air cells are clear. Other: None. ASPECTS [facility] Stroke Program Early CT Score) - Ganglionic level infarction (caudate, lentiform nuclei, internal capsule, insula, M1-M3 cortex): 7 - Supraganglionic infarction  (M4-M6 cortex): 3 Total score (0-10 with 10 being normal): 10 IMPRESSION: 1. No evidence of acute intracranial abnormality. 2. ASPECTS is 10. 3. Mild chronic small vessel ischemic disease. These results were communicated to Dr. Lorraine Lax at 4:45 pm on 10/09/2019 by text page via the [facility] messaging system. Electronically Signed   By: Logan Bores M.D.   On: 10/09/2019 16:45    Scheduled Meds: .  stroke: mapping our early stages of recovery book   Does not apply Once  . atorvastatin  80 mg Oral q morning - 10a  . calcitRIOL  0.25 mcg Oral q morning - 10a  . clopidogrel  75 mg Oral Daily  . docusate sodium  100 mg Oral BID  . enoxaparin (LOVENOX) injection  40 mg Subcutaneous Q24H  . ferrous sulfate  325 mg Oral Q breakfast  . insulin aspart  0-9 Units Subcutaneous TID WC  . lubiprostone  24 mcg Oral BID WC  . pantoprazole  40 mg Oral q morning - 10a   Continuous Infusions:  Principal Problem:   TIA (transient ischemic attack) Active Problems:   Hypertension   Hand numbness   Type 2 diabetes mellitus (HCC)   Pacemaker   Obstructive sleep apnea   GERD (gastroesophageal reflux disease)    Time spent: 14 minutes    Ken Caryl NP [facility]  If 7PM-7AM, please contact night-coverage at [email or web address], password [facility] 10/10/2019, 9:56 AM  LOS: 0 days

## 2019-10-10 NOTE — ED Notes (Signed)
This RN sent message to MD Tu to inquire about diet order for pt after having passed swallow screen.

## 2019-10-10 NOTE — ED Notes (Signed)
ED TO INPATIENT HANDOFF REPORT  ED Nurse Name and Phone #: [phone]  S Name/Age/Gender [name] 85 y.o. female Room/Bed: 010C/010C  Code Status   Code Status: Full Code  Home/SNF/Other Home Patient oriented to: self, place, time and situation Is this baseline? Yes   Triage Complete: Triage complete  Chief Complaint TIA (transient ischemic attack) [G45.9]  Triage Note Per daughter family called EMS for Left hand pain radiating up into her Left arm. Symptoms started approx 1 pm. Daughter reports pt having shaking and trembling started around 2pm today, states she did the same when she had a stroke in the past. Pt with a slight left side facial droop, follows commands but is easily confused, pt makes an inaapropriate laughing chuckle at times, tremors when touched     Allergies No Known Allergies  Level of Care/Admitting Diagnosis ED Disposition    ED Disposition Condition [place]: [facility] [[id number]]  Level of Care: Telemetry Medical [104]  I expect the patient will be discharged within 24 hours: No (not a candidate for 5C-Observation unit)  Covid Evaluation: Asymptomatic Screening Protocol (No Symptoms)  Diagnosis: TIA (transient ischemic attack) [008676]  Admitting Physician: [name] [[id number]]  Attending Physician: [name] [[id number]]       B Medical/Surgery History Past Medical History:  Diagnosis Date  . Anemia   . Arthritis   . Celiac disease   . Chronic systolic CHF (congestive heart failure) ([place])   . Complete heart block ([place])   . Coronary artery disease   . Diabetes mellitus    INSULIN DEPENDENT  . GERD (gastroesophageal reflux disease)   . Glaucoma   . Headache(784.0)   . Heart disease   . Hypercholesterolemia   . Hypertension   . Iron deficiency anemia, unspecified   . Ischemic cardiomyopathy   . Memory difficulties 04/14/2019  . Shortness of breath   . Sleep apnea    uses cpap  .  Stroke ([place])   . Unspecified constipation    Past Surgical History:  Procedure Laterality Date  . BACK SURGERY    . BI-VENTRICULAR PACEMAKER INSERTION (CRT-P)  March 2014   [facility]  . BIV PACEMAKER GENERATOR CHANGEOUT N/A 06/29/2019   Procedure: BIV PACEMAKER GENERATOR CHANGEOUT;  Surgeon: Manning Lance, MD;  Location: [facility];  Service: Cardiovascular;  Laterality: N/A;  . CARDIAC CATHETERIZATION  09/02/2012  . CARDIAC SURGERY    . CORONARY ANGIOPLASTY WITH STENT PLACEMENT  09/02/2012   RCA  . PERCUTANEOUS CORONARY STENT INTERVENTION (PCI-S) N/A 09/02/2012   Procedure: PERCUTANEOUS CORONARY STENT INTERVENTION (PCI-S);  Surgeon: Vicki Demark, MD;  Location: [facility] CATH LAB;  Service: Cardiovascular;  Laterality: N/A;  . PERMANENT PACEMAKER INSERTION N/A 11/29/2012   Procedure: PERMANENT PACEMAKER INSERTION;  Surgeon: Vicki Sprang, MD;  Location: [facility] CATH LAB;  Service: Cardiovascular;  Laterality: N/A;  . TEMPORARY PACEMAKER INSERTION N/A 11/28/2012   Procedure: TEMPORARY PACEMAKER INSERTION;  Surgeon: Vicki Demark, MD;  Location: [facility] CATH LAB;  Service: Cardiovascular;  Laterality: N/A;     A IV Location/Drains/Wounds Patient Lines/Drains/Airways Status   Active Line/Drains/Airways    Name:   Placement date:   Placement time:   Site:   Days:   Peripheral IV 10/09/19 Right Antecubital   10/09/19    1717    Antecubital   1          Intake/Output Last 24 hours No intake or  output data in the 24 hours ending 10/10/19 0277  Labs/Imaging Results for orders placed or performed during the hospital encounter of 10/09/19 (from the past 48 hour(s))  Ethanol     Status: None   Collection Time: 10/09/19  5:19 PM  Result Value Ref Range   Alcohol, Ethyl (B) <10 <10 mg/dL    Comment: (NOTE) Lowest detectable limit for serum alcohol is 10 mg/dL. For medical purposes only. Performed at [facility], [place] [address] [place], [place] [address]   Protime-INR      Status: None   Collection Time: 10/09/19  5:19 PM  Result Value Ref Range   Prothrombin Time 12.5 11.4 - 15.2 seconds   INR 0.9 0.8 - 1.2    Comment: (NOTE) INR goal varies based on device and disease states. Performed at [facility], [place] [address], [place], [place] [address]   APTT     Status: None   Collection Time: 10/09/19  5:19 PM  Result Value Ref Range   aPTT 31 24 - 36 seconds    Comment: Performed at [facility] [address] [place], [place] [address]  CBC     Status: Abnormal   Collection Time: 10/09/19  5:19 PM  Result Value Ref Range   WBC 7.0 4.0 - 10.5 K/uL   RBC 3.97 3.87 - 5.11 MIL/uL   Hemoglobin 11.5 (L) 12.0 - 15.0 g/dL   HCT 35.7 (L) 36.0 - 46.0 %   MCV 89.9 80.0 - 100.0 fL   MCH 29.0 26.0 - 34.0 pg   MCHC 32.2 30.0 - 36.0 g/dL   RDW 12.5 11.5 - 15.5 %   Platelets 243 150 - 400 K/uL   nRBC 0.0 0.0 - 0.2 %    Comment: Performed at [facility], [place] [address], [place], [place] [address]  Differential     Status: None   Collection Time: 10/09/19  5:19 PM  Result Value Ref Range   Neutrophils Relative % 49 %   Neutro Abs 3.4 1.7 - 7.7 K/uL   Lymphocytes Relative 41 %   Lymphs Abs 2.9 0.7 - 4.0 K/uL   Monocytes Relative 8 %   Monocytes Absolute 0.6 0.1 - 1.0 K/uL   Eosinophils Relative 1 %   Eosinophils Absolute 0.1 0.0 - 0.5 K/uL   Basophils Relative 1 %   Basophils Absolute 0.0 0.0 - 0.1 K/uL   Immature Granulocytes 0 %   Abs Immature Granulocytes 0.01 0.00 - 0.07 K/uL    Comment: Performed at [facility] [address], [place], [place] [address]  Comprehensive metabolic panel     Status: Abnormal   Collection Time: 10/09/19  5:19 PM  Result Value Ref Range   Sodium 139 135 - 145 mmol/L   Potassium 3.7 3.5 - 5.1 mmol/L   Chloride 103 98 - 111 mmol/L   CO2 29 22 - 32 mmol/L   Glucose, Bld 124 (H) 70 - 99 mg/dL   BUN 15 8 - 23 mg/dL   Creatinine, Ser 1.24 (H) 0.44 - 1.00 mg/dL   Calcium 10.7 (H) 8.9 - 10.3 mg/dL    Total Protein 7.4 6.5 - 8.1 g/dL   Albumin 3.9 3.5 - 5.0 g/dL   AST 23 15 - 41 U/L   ALT 26 0 - 44 U/L   Alkaline Phosphatase 68 38 - 126 U/L   Total Bilirubin 0.6 0.3 - 1.2 mg/dL   GFR calc non  Af Amer 38 (L) >60 mL/min   GFR calc Af Amer 45 (L) >60 mL/min   Anion gap 7 5 - 15    Comment: Performed at [facility] [address], [place], [place] [address]  Lactic acid, plasma     Status: None   Collection Time: 10/09/19  5:19 PM  Result Value Ref Range   Lactic Acid, Venous 1.5 0.5 - 1.9 mmol/L    Comment: Performed at [facility] [address] [place], [place] [address]  I-stat chem 8, ED     Status: Abnormal   Collection Time: 10/09/19  5:52 PM  Result Value Ref Range   Sodium 139 135 - 145 mmol/L   Potassium 3.7 3.5 - 5.1 mmol/L   Chloride 101 98 - 111 mmol/L   BUN 16 8 - 23 mg/dL   Creatinine, Ser 1.10 (H) 0.44 - 1.00 mg/dL   Glucose, Bld 119 (H) 70 - 99 mg/dL   Calcium, Ion 1.41 (H) 1.15 - 1.40 mmol/L   TCO2 30 22 - 32 mmol/L   Hemoglobin 12.2 12.0 - 15.0 g/dL   HCT 36.0 36.0 - 46.0 %  CBG monitoring, ED     Status: Abnormal   Collection Time: 10/09/19  5:54 PM  Result Value Ref Range   Glucose-Capillary 115 (H) 70 - 99 mg/dL  Urine rapid drug screen (hosp performed)     Status: None   Collection Time: 10/09/19  8:52 PM  Result Value Ref Range   Opiates NONE DETECTED NONE DETECTED   Cocaine NONE DETECTED NONE DETECTED   Benzodiazepines NONE DETECTED NONE DETECTED   Amphetamines NONE DETECTED NONE DETECTED   Tetrahydrocannabinol NONE DETECTED NONE DETECTED   Barbiturates NONE DETECTED NONE DETECTED    Comment: (NOTE) DRUG SCREEN FOR MEDICAL PURPOSES ONLY.  IF CONFIRMATION IS NEEDED FOR ANY PURPOSE, NOTIFY LAB WITHIN 5 DAYS. LOWEST DETECTABLE LIMITS FOR URINE DRUG SCREEN Drug Class                     Cutoff (ng/mL) Amphetamine and metabolites    1000 Barbiturate and metabolites    200 Benzodiazepine                 967 Tricyclics and  metabolites     300 Opiates and metabolites        300 Cocaine and metabolites        300 THC                            50 Performed at [facility], [place] [address] [place], [place] [address]   Urinalysis, Routine w reflex microscopic     Status: Abnormal   Collection Time: 10/09/19  8:52 PM  Result Value Ref Range   Color, Urine STRAW (A) YELLOW   APPearance CLEAR CLEAR   Specific Gravity, Urine 1.004 (L) 1.005 - 1.030   pH 6.0 5.0 - 8.0   Glucose, UA NEGATIVE NEGATIVE mg/dL   Hgb urine dipstick NEGATIVE NEGATIVE   Bilirubin Urine NEGATIVE NEGATIVE   Ketones, ur NEGATIVE NEGATIVE mg/dL   Protein, ur NEGATIVE NEGATIVE mg/dL   Nitrite NEGATIVE NEGATIVE   Leukocytes,Ua NEGATIVE NEGATIVE    Comment: Performed at [facility] [address] [place], [place] [address]  Lactic acid, plasma     Status: None   Collection Time: 10/09/19  8:53 PM  Result Value Ref Range  Lactic Acid, Venous 0.8 0.5 - 1.9 mmol/L    Comment: Performed at [facility] [address] [place], [place] [address]  SARS CORONAVIRUS 2 (TAT 6-24 HRS) Nasopharyngeal Nasopharyngeal Swab     Status: None   Collection Time: 10/09/19 10:05 PM   Specimen: Nasopharyngeal Swab  Result Value Ref Range   SARS Coronavirus 2 NEGATIVE NEGATIVE    Comment: (NOTE) SARS-CoV-2 target nucleic acids are NOT DETECTED. The SARS-CoV-2 RNA is generally detectable in upper and lower respiratory specimens during the acute phase of infection. Negative results do not preclude SARS-CoV-2 infection, do not rule out co-infections with other pathogens, and should not be used as the sole basis for treatment or other patient management decisions. Negative results must be combined with clinical observations, patient history, and epidemiological information. The expected result is Negative. Fact Sheet for Patients: SugarRoll.be Fact Sheet for Healthcare  Providers: https://www.woods-mathews.com/ This test is not yet approved or cleared by the [place] FDA and  has been authorized for detection and/or diagnosis of SARS-CoV-2 by FDA under an Emergency Use Authorization (EUA). This EUA will remain  in effect (meaning this test can be used) for the duration of the COVID-19 declaration under Section 56 4(b)(1) of the Act, 21 U.S.C. section 360bbb-3(b)(1), unless the authorization is terminated or revoked sooner. Performed at [facility], [place] [address], [place], [place] [address]   Hemoglobin A1c     Status: Abnormal   Collection Time: 10/10/19  3:55 AM  Result Value Ref Range   Hgb A1c MFr Bld 8.1 (H) 4.8 - 5.6 %    Comment: (NOTE) Pre diabetes:          5.7%-6.4% Diabetes:              >6.4% Glycemic control for   <7.0% adults with diabetes    Mean Plasma Glucose 185.77 mg/dL    Comment: Performed at [facility] [address] [place], [place] [address]  Lipid panel     Status: None   Collection Time: 10/10/19  3:55 AM  Result Value Ref Range   Cholesterol 166 0 - 200 mg/dL   Triglycerides 74 <150 mg/dL   HDL 53 >40 mg/dL   Total CHOL/HDL Ratio 3.1 RATIO   VLDL 15 0 - 40 mg/dL   LDL Cholesterol 98 0 - 99 mg/dL    Comment:        Total Cholesterol/HDL:CHD Risk Coronary Heart Disease Risk Table                     Men   Women  1/2 Average Risk   3.4   3.3  Average Risk       5.0   4.4  2 X Average Risk   9.6   7.1  3 X Average Risk  23.4   11.0        Use the calculated Patient Ratio above and the CHD Risk Table to determine the patient's CHD Risk.        ATP III CLASSIFICATION (LDL):  <100     mg/dL   Optimal  100-129  mg/dL   Near or Above                    Optimal  130-159  mg/dL   Borderline  160-189  mg/dL   High  >190     mg/dL   Very High Performed at [facility], [address] [place], [place] [address]    CT CERVICAL SPINE WO CONTRAST  Result Date:  10/10/2019 CLINICAL DATA:  Cervical radiculopathy. Neck pain. Hand numbness and tingling. EXAM: CT CERVICAL SPINE WITHOUT CONTRAST TECHNIQUE: Multidetector CT imaging of the cervical spine was performed without intravenous contrast. Multiplanar CT image reconstructions were also generated. COMPARISON:  CT cervical spine 03/09/2013 FINDINGS: Alignment: Trace anterolisthesis of C4 on C5, unchanged. Slight anterolisthesis of C5 on C6 has slightly increased from 2014. Skull base and vertebrae: Vertebral body heights are preserved. No acute fracture. Patient has remote history of C6 pedicle fracture that has healed with stable minimal residual posttraumatic deformity. Soft tissues and spinal canal: No prevertebral fluid or swelling. No visible canal hematoma. Disc levels: Disc space narrowing and endplate spurring at G6-Y6, slightly progressed from prior. There is multilevel facet hypertrophy. Partial bony ankylosis of C2-C3 on the left, degenerative and progressed from prior. Left neural foraminal stenosis at C3-C4, slightly progressed from prior. Mild left neural foraminal stenosis at C2-C3, also progressed. No significant canal stenosis. Upper chest: Large heterogeneous right thyroid nodule measures 3.9 cm, unchanged from neck CTA 06/21/2019. there has been previous right thyroid biopsy 08/24/2013. Other: Carotid calcifications. IMPRESSION: 1. Multilevel degenerative disc disease and facet arthropathy. 2. Facet hypertrophy at C2-C3 on the left has progressed with increased neural foraminal stenosis from 2014. Left C3-C4 facet hypertrophy with unchanged neural foraminal stenosis. 3. No acute osseous abnormality.  No high-grade canal stenosis. Electronically Signed   By: Keith Rake M.D.   On: 10/10/2019 00:30   CT HEAD CODE STROKE WO CONTRAST  Result Date: 10/09/2019 CLINICAL DATA:  Code stroke.  Right-sided numbness and weakness. EXAM: CT HEAD WITHOUT CONTRAST TECHNIQUE: Contiguous axial images were  obtained from the base of the skull through the vertex without intravenous contrast. COMPARISON:  07/01/2019 FINDINGS: Brain: There is no evidence of acute infarct, intracranial hemorrhage, mass, midline shift, or extra-axial fluid collection. Mild cerebral atrophy is within normal limits for age. Hypodensities in the cerebral white matter bilaterally are unchanged and nonspecific but compatible with chronic small vessel ischemic disease, mild for age. Vascular: Calcified atherosclerosis at the skull base. No hyperdense vessel. Skull: No fracture or suspicious osseous lesion. Sinuses/Orbits: Bilateral cataract extraction and glaucoma drainage devices. Visualized paranasal sinuses and mastoid air cells are clear. Other: None. ASPECTS [facility] Stroke Program Early CT Score) - Ganglionic level infarction (caudate, lentiform nuclei, internal capsule, insula, M1-M3 cortex): 7 - Supraganglionic infarction (M4-M6 cortex): 3 Total score (0-10 with 10 being normal): 10 IMPRESSION: 1. No evidence of acute intracranial abnormality. 2. ASPECTS is 10. 3. Mild chronic small vessel ischemic disease. These results were communicated to Dr. Lorraine Lax at 4:45 pm on 10/09/2019 by text page via the [facility] messaging system. Electronically Signed   By: Logan Bores M.D.   On: 10/09/2019 16:45    Pending Labs Unresulted Labs (From admission, onward)   None      Vitals/Pain Today's Vitals   10/10/19 0200 10/10/19 0215 10/10/19 0300 10/10/19 0400  BP: 120/61  138/64 131/60  Pulse: 78 78 80 79  Resp:   (!) 21 20  Temp:      TempSrc:      SpO2: 97% 97% 99% 98%    Isolation Precautions No active isolations  Medications Medications   stroke: mapping our early stages of recovery book (has no administration in time range)  acetaminophen (TYLENOL) tablet 650 mg (has no administration in time range)    Or  acetaminophen (TYLENOL)  160 MG/5ML solution 650 mg (has no administration in time range)    Or  acetaminophen (TYLENOL)  suppository 650 mg (has no administration in time range)  senna-docusate (Senokot-S) tablet 1 tablet (has no administration in time range)  enoxaparin (LOVENOX) injection 40 mg (40 mg Subcutaneous Given 10/09/19 2215)  atorvastatin (LIPITOR) tablet 80 mg (has no administration in time range)  calcitRIOL (ROCALTROL) capsule 0.25 mcg (has no administration in time range)  docusate sodium (COLACE) capsule 100 mg (has no administration in time range)  lubiprostone (AMITIZA) capsule 24 mcg (has no administration in time range)  pantoprazole (PROTONIX) EC tablet 40 mg (has no administration in time range)  clopidogrel (PLAVIX) tablet 75 mg (has no administration in time range)  ferrous sulfate tablet 325 mg (has no administration in time range)  insulin aspart (novoLOG) injection 0-9 Units (has no administration in time range)    Mobility walks with device     Focused Assessments Neuro Assessment Handoff:  Swallow screen pass? Yes  Cardiac Rhythm: Normal sinus rhythm NIH Stroke Scale ( + Modified Stroke Scale Criteria)  Interval: Initial Level of Consciousness (1a.)   : Alert, keenly responsive LOC Questions (1b. )   +: Answers both questions correctly LOC Commands (1c. )   + : Performs both tasks correctly Best Gaze (2. )  +: Normal Visual (3. )  +: No visual loss Facial Palsy (4. )    : Normal symmetrical movements Motor Arm, Left (5a. )   +: No drift Motor Arm, Right (5b. )   +: No drift Motor Leg, Left (6a. )   +: No drift Motor Leg, Right (6b. )   +: No drift Limb Ataxia (7. ): Absent Sensory (8. )   +: Normal, no sensory loss Best Language (9. )   +: No aphasia Dysarthria (10. ): Normal Extinction/Inattention (11.)   +: No Abnormality Modified SS Total  +: 0 Complete NIHSS TOTAL: 0     Neuro Assessment: Exceptions to [facility] Neuro Checks:   Initial (10/09/19 2222)  Last Documented NIHSS Modified Score: 0 (10/10/19 0630) Has TPA been given? No If patient is a Neuro Trauma  and patient is going to OR before floor call report to [facility] nurse: [phone] or [phone]     R Recommendations: See Admitting Provider Note  Report given to:   Additional Notes:   Pt lives with son full-time and daughter lives across the street

## 2019-10-10 NOTE — ED Notes (Signed)
Daughter was at bedside with pt. This RN informed daughter of updated visitors policy. Daughter requested any updates be communicated, this RN informed daughter that update would likely be in the morning based on how pt is currently doing. This RN informed daughter that if anything changes, she will be informed.  Daughter's name: Caren Griffins [phone]

## 2019-10-12 ENCOUNTER — Emergency Department (HOSPITAL_COMMUNITY)
Admission: EM | Admit: 2019-10-12 | Discharge: 2019-10-13 | Disposition: A | Discharge status: Home / Self Care | Payer: Medicare Other | Attending: Emergency Medicine | Admitting: Emergency Medicine

## 2019-10-12 ENCOUNTER — Encounter (HOSPITAL_COMMUNITY): Payer: Self-pay

## 2019-10-12 DIAGNOSIS — R4586 Emotional lability: Secondary | ICD-10-CM | POA: Insufficient documentation

## 2019-10-12 DIAGNOSIS — Z794 Long term (current) use of insulin: Secondary | ICD-10-CM | POA: Insufficient documentation

## 2019-10-12 DIAGNOSIS — Z79899 Other long term (current) drug therapy: Secondary | ICD-10-CM | POA: Diagnosis not present

## 2019-10-12 DIAGNOSIS — Z7901 Long term (current) use of anticoagulants: Secondary | ICD-10-CM | POA: Diagnosis not present

## 2019-10-12 DIAGNOSIS — I5022 Chronic systolic (congestive) heart failure: Secondary | ICD-10-CM | POA: Insufficient documentation

## 2019-10-12 DIAGNOSIS — R251 Tremor, unspecified: Secondary | ICD-10-CM | POA: Insufficient documentation

## 2019-10-12 DIAGNOSIS — Z8673 Personal history of transient ischemic attack (TIA), and cerebral infarction without residual deficits: Secondary | ICD-10-CM | POA: Insufficient documentation

## 2019-10-12 DIAGNOSIS — R519 Headache, unspecified: Secondary | ICD-10-CM | POA: Diagnosis not present

## 2019-10-12 DIAGNOSIS — E119 Type 2 diabetes mellitus without complications: Secondary | ICD-10-CM | POA: Diagnosis not present

## 2019-10-12 DIAGNOSIS — Z20828 Contact with and (suspected) exposure to other viral communicable diseases: Secondary | ICD-10-CM | POA: Diagnosis not present

## 2019-10-12 DIAGNOSIS — I110 Hypertensive heart disease with heart failure: Secondary | ICD-10-CM | POA: Insufficient documentation

## 2019-10-12 LAB — DIFFERENTIAL
Abs Immature Granulocytes: 0.01 10*3/uL (ref 0.00–0.07)
Basophils Absolute: 0 10*3/uL (ref 0.0–0.1)
Basophils Relative: 1 %
Eosinophils Absolute: 0.1 10*3/uL (ref 0.0–0.5)
Eosinophils Relative: 2 %
Immature Granulocytes: 0 %
Lymphocytes Relative: 46 %
Lymphs Abs: 4 10*3/uL (ref 0.7–4.0)
Monocytes Absolute: 0.7 10*3/uL (ref 0.1–1.0)
Monocytes Relative: 8 %
Neutro Abs: 3.6 10*3/uL (ref 1.7–7.7)
Neutrophils Relative %: 43 %

## 2019-10-12 LAB — I-STAT CHEM 8, ED
BUN: 19 mg/dL (ref 8–23)
Calcium, Ion: 1.48 mmol/L — ABNORMAL HIGH (ref 1.15–1.40)
Chloride: 102 mmol/L (ref 98–111)
Creatinine, Ser: 1.5 mg/dL — ABNORMAL HIGH (ref 0.44–1.00)
Glucose, Bld: 149 mg/dL — ABNORMAL HIGH (ref 70–99)
HCT: 35 % — ABNORMAL LOW (ref 36.0–46.0)
Hemoglobin: 11.9 g/dL — ABNORMAL LOW (ref 12.0–15.0)
Potassium: 3.9 mmol/L (ref 3.5–5.1)
Sodium: 140 mmol/L (ref 135–145)
TCO2: 29 mmol/L (ref 22–32)

## 2019-10-12 LAB — COMPREHENSIVE METABOLIC PANEL
ALT: 25 U/L (ref 0–44)
AST: 24 U/L (ref 15–41)
Albumin: 3.8 g/dL (ref 3.5–5.0)
Alkaline Phosphatase: 70 U/L (ref 38–126)
Anion gap: 8 (ref 5–15)
BUN: 18 mg/dL (ref 8–23)
CO2: 28 mmol/L (ref 22–32)
Calcium: 11.2 mg/dL — ABNORMAL HIGH (ref 8.9–10.3)
Chloride: 104 mmol/L (ref 98–111)
Creatinine, Ser: 1.56 mg/dL — ABNORMAL HIGH (ref 0.44–1.00)
GFR calc Af Amer: 34 mL/min — ABNORMAL LOW (ref >60)
GFR calc non Af Amer: 29 mL/min — ABNORMAL LOW (ref >60)
Glucose, Bld: 156 mg/dL — ABNORMAL HIGH (ref 70–99)
Potassium: 4.1 mmol/L (ref 3.5–5.1)
Sodium: 140 mmol/L (ref 135–145)
Total Bilirubin: 0.7 mg/dL (ref 0.3–1.2)
Total Protein: 7.4 g/dL (ref 6.5–8.1)

## 2019-10-12 LAB — CBC
HCT: 35.7 % — ABNORMAL LOW (ref 36.0–46.0)
Hemoglobin: 11.2 g/dL — ABNORMAL LOW (ref 12.0–15.0)
MCH: 28.7 pg (ref 26.0–34.0)
MCHC: 31.4 g/dL (ref 30.0–36.0)
MCV: 91.5 fL (ref 80.0–100.0)
Platelets: 226 10*3/uL (ref 150–400)
RBC: 3.9 MIL/uL (ref 3.87–5.11)
RDW: 12.7 % (ref 11.5–15.5)
WBC: 8.4 10*3/uL (ref 4.0–10.5)
nRBC: 0 % (ref 0.0–0.2)

## 2019-10-12 LAB — PROTIME-INR
INR: 1 (ref 0.8–1.2)
Prothrombin Time: 12.8 seconds (ref 11.4–15.2)

## 2019-10-12 LAB — APTT: aPTT: 32 seconds (ref 24–36)

## 2019-10-12 MED ORDER — SODIUM CHLORIDE 0.9% FLUSH
3.0000 mL | Freq: Once | INTRAVENOUS | Status: DC
Start: 2019-10-12 — End: 2019-10-13

## 2019-10-12 NOTE — ED Triage Notes (Signed)
Pt comes via [facility] EMS called out for a shaking episode that resolved by the time EMS got there, per EMS pt had a slight R arm drift that resolved by the time they got to the truck and then the pt became very anxious and has another shaking episode, pt remained AxO. Neuro intact bilaterally.

## 2019-10-13 ENCOUNTER — Other Ambulatory Visit: Payer: Self-pay

## 2019-10-13 LAB — CBG MONITORING, ED: Glucose-Capillary: 162 mg/dL — ABNORMAL HIGH (ref 70–99)

## 2019-10-13 LAB — POC SARS CORONAVIRUS 2 AG -  ED: SARS Coronavirus 2 Ag: NEGATIVE

## 2019-10-13 NOTE — Discharge Instructions (Addendum)
You were diagnosed with a mini-stroke on your last hospital visit (10/09/19).  Today your presentation did not seem consistent with a stroke or seizure.  We recommended that you continue taking your Plavix and follow up with the neurologist in the office (the phone number is included).  Your bloodwork was reassuring and your COVID test was negative.  It is possible that some of your symptoms are related to the trauma that you've experienced this year with so many losses in your life.  This is not something we can definitively diagnose in the emergency room.  For your reference only, I've included information to read over about TIA (mini stroke) and what signs and symptoms to look out for, and return precautions.  Please take your normal morning medicine when you get home today (10/13/2019).
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
## 2019-10-13 NOTE — ED Notes (Signed)
Patient verbalizes understanding of discharge instructions. Opportunity for questioning and answers were provided. Armband removed by staff, pt discharged from ED. Ambulated out to lobby  

## 2019-10-13 NOTE — ED Notes (Signed)
Pt given dc instructions pt verbalizes understanding.  

## 2019-10-17 DIAGNOSIS — I255 Ischemic cardiomyopathy: Secondary | ICD-10-CM | POA: Insufficient documentation

## 2019-10-18 ENCOUNTER — Ambulatory Visit (INDEPENDENT_AMBULATORY_CARE_PROVIDER_SITE_OTHER): Payer: Medicare Other | Admitting: *Deleted

## 2019-10-18 ENCOUNTER — Encounter: Payer: Self-pay | Admitting: Internal Medicine

## 2019-10-18 ENCOUNTER — Ambulatory Visit (INDEPENDENT_AMBULATORY_CARE_PROVIDER_SITE_OTHER): Payer: Medicare Other | Admitting: Internal Medicine

## 2019-10-18 ENCOUNTER — Other Ambulatory Visit: Payer: Self-pay

## 2019-10-18 VITALS — BP 114/62 | HR 70 | Ht 64.0 in | Wt 171.0 lb

## 2019-10-18 DIAGNOSIS — I459 Conduction disorder, unspecified: Secondary | ICD-10-CM

## 2019-10-18 DIAGNOSIS — I255 Ischemic cardiomyopathy: Secondary | ICD-10-CM

## 2019-10-18 DIAGNOSIS — R6883 Chills (without fever): Secondary | ICD-10-CM

## 2019-10-18 DIAGNOSIS — Z950 Presence of cardiac pacemaker: Secondary | ICD-10-CM

## 2019-10-18 NOTE — Progress Notes (Signed)
f      Patient Care Team: [name], MD as PCP - General (Internal Medicine)   HPI  [name] is a 85 y.o. female Seen in followup for CRT P. implantation 2014 for complete heart block in the setting of ischemic heart disease and ejection fraction of 40%.  She underwent device generator replacement (GT) 9/20)    She is a history of prior bypass surgery and RCA stent   No dyspnea or chest pain But intermittent shakes and chills without fevers     DATE TEST EF   2/14 Echo   40-45 %   11/18 Echo  50%      Date Cr K Hgb  10/16 1.56 3.5 10.2   3/18  1.0    She is 85 years old I be confused to    Saw Dr [facility] about 2 weeks ago  Past Medical History:  Diagnosis Date  . Anemia   . Arthritis   . Celiac disease   . Chronic systolic CHF (congestive heart failure) ([place])   . Complete heart block ([place])   . Coronary artery disease   . Diabetes mellitus    INSULIN DEPENDENT  . GERD (gastroesophageal reflux disease)   . Glaucoma   . Headache(784.0)   . Heart disease   . Hypercholesterolemia   . Hypertension   . Iron deficiency anemia, unspecified   . Ischemic cardiomyopathy   . Memory difficulties 04/14/2019  . Shortness of breath   . Sleep apnea    uses cpap  . Stroke ([place])   . Unspecified constipation     Past Surgical History:  Procedure Laterality Date  . BACK SURGERY    . BI-VENTRICULAR PACEMAKER INSERTION (CRT-P)  March 2014   [facility]  . BIV PACEMAKER GENERATOR CHANGEOUT N/A 06/29/2019   Procedure: BIV PACEMAKER GENERATOR CHANGEOUT;  Surgeon: Evans Lance, MD;  Location: [place] CV LAB;  Service: Cardiovascular;  Laterality: N/A;  . CARDIAC CATHETERIZATION  09/02/2012  . CARDIAC SURGERY    . CORONARY ANGIOPLASTY WITH STENT PLACEMENT  09/02/2012   RCA  . PERCUTANEOUS CORONARY STENT INTERVENTION (PCI-S) N/A 09/02/2012   Procedure: PERCUTANEOUS CORONARY STENT INTERVENTION (PCI-S);  Surgeon: Clent Demark, MD;  Location[facility] CATH LAB;   Service: Cardiovascular;  Laterality: N/A;  . PERMANENT PACEMAKER INSERTION N/A 11/29/2012   Procedure: PERMANENT PACEMAKER INSERTION;  Surgeon: Deboraha Sprang, MD;  Location: [facility] CATH LAB;  Service: Cardiovascular;  Laterality: N/A;  . TEMPORARY PACEMAKER INSERTION N/A 11/28/2012   Procedure: TEMPORARY PACEMAKER INSERTION;  Surgeon: Clent Demark, MD;  Location: [facility] CATH LAB;  Service: Cardiovascular;  Laterality: N/A;    Current Outpatient Medications  Medication Sig Dispense Refill  . amLODipine (NORVASC) 2.5 MG tablet Take 2.5 mg by mouth every morning.    Marland Kitchen atorvastatin (LIPITOR) 80 MG tablet Take 80 mg by mouth every morning.   5  . brimonidine-timolol (COMBIGAN) 0.2-0.5 % ophthalmic solution Place 1 drop into both eyes 2 (two) times daily.    . calcitRIOL (ROCALTROL) 0.25 MCG capsule Take 0.25 mcg by mouth every morning.    . carvedilol (COREG) 3.125 MG tablet Take 3.125 mg by mouth 2 (two) times daily.  3  . clopidogrel (PLAVIX) 75 MG tablet Take 1 tablet (75 mg total) by mouth daily. 30 tablet 0  . Cod Liver Oil CAPS Take 1 capsule by mouth every morning.    . docusate sodium 100 MG CAPS Take 100 mg by mouth 2 (  two) times daily. 60 capsule 0  . dorzolamide (TRUSOPT) 2 % ophthalmic solution Place 1 drop into both eyes 2 (two) times daily.     . ferrous sulfate 325 (65 FE) MG tablet Take 325 mg by mouth daily with breakfast.    . insulin degludec (TRESIBA FLEXTOUCH) 100 UNIT/ML SOPN FlexTouch Pen Inject 10-20 Units into the skin at bedtime. Based on CBG    . Insulin Lispro (HUMALOG KWIKPEN) 200 UNIT/ML SOPN Inject 6 Units into the skin 3 (three) times daily as needed (high blood sugar).     Marland Kitchen latanoprost (XALATAN) 0.005 % ophthalmic solution Appointment OVERDUE, place 1 drop into both eyes daily at bedtime 2.5 mL 0  . linagliptin (TRADJENTA) 5 MG TABS tablet Take 5 mg by mouth every morning.    . loperamide (IMODIUM) 2 MG capsule Take 2 mg by mouth as needed for diarrhea or loose stools.     . lubiprostone (AMITIZA) 24 MCG capsule Take 1 capsule (24 mcg total) by mouth 2 (two) times daily with a meal. 90 capsule 3  . Multiple Vitamin (MULTIVITAMIN WITH MINERALS) TABS Take 1 tablet by mouth every morning.     . Multiple Vitamins-Minerals (EYE VITAMINS) CAPS Take 1 capsule by mouth every morning.    . niacin 100 MG tablet Take 1 tablet (100 mg total) by mouth at bedtime. 30 tablet 0  . nitroGLYCERIN (NITROSTAT) 0.4 MG SL tablet Place 0.4 mg under the tongue every 5 (five) minutes as needed for chest pain.     Marland Kitchen NOVOFINE 32G X 6 MM MISC     . pantoprazole (PROTONIX) 40 MG tablet Take 40 mg by mouth every morning.     . polyethylene glycol powder (GLYCOLAX/MIRALAX) powder TAKE 17 G BY MOUTH DAILY. HOLD FOR LOOSE STOOL 3162 g 1  . valsartan-hydrochlorothiazide (DIOVAN-HCT) 320-12.5 MG tablet Take 1 tablet by mouth every morning.      No current facility-administered medications for this visit.    No Known Allergies  Review of Systems negative except from HPI and PMH  Physical Exam BP 114/62   Pulse 70   Ht 5' 4"  (1.626 m)   Wt 171 lb (77.6 kg)   SpO2 99%   BMI 29.35 kg/m  Well developed and well nourished in no acute distress HENT normal Neck supple with JVP-flat Clear Device pocket well healed; without hematoma or erythema.  There is no tethering  Regular rate and rhythm, no  gallop No murmur Abd-soft with active BS No Clubbing cyanosis  edema Skin-warm and dry A & Oriented  Grossly normal sensory and motor function  ECG sinus rhythm with a negative QRS lead I and qR lead V1 QRS duration 124 ms  Assessment and  Plan  ICM  S/p CRT-P  [employer] The patient's device was interrogated.  The information was reviewed. No changes were made in the programming.     CHB Stable post pacing   CHF chronic mixed  HTN  Chills    ?  Device infection     Blood pressure is low today.  Spoke with her daughter who also agreed that it was low but did not have her baseline  numbers in her head.  Device pocket looks normal.  However, I am concerned about the chills especially with the cold sensation.  While she has longstanding mild anemia, the symptoms are new and are worrisome for possible device infection.  We will obtain blood cultures x2.  Without symptoms of ischemia  Euvolemic  continue current meds  We spent more than 50% of our >25 min visit in face to face counseling regarding the above

## 2019-10-18 NOTE — Patient Instructions (Signed)
Medication Instructions:  Your physician recommends that you continue on your current medications as directed. Please refer to the Current Medication list given to you today.  *If you need a refill on your cardiac medications before your next appointment, please call your pharmacy*  Lab Work: Blood cultures today  If you have labs (blood work) drawn today and your tests are completely normal, you will receive your results only by: Marland Kitchen MyChart Message (if you have MyChart) OR . A paper copy in the mail If you have any lab test that is abnormal or we need to change your treatment, we will call you to review the results.  Testing/Procedures: None ordered.   Follow-Up: At [facility], you and your health needs are our priority.  As part of our continuing mission to provide you with exceptional heart care, we have created designated Provider Care Teams.  These Care Teams include your primary Cardiologist (physician) and Advanced Practice Providers (APPs -  Physician Assistants and Nurse Practitioners) who all work together to provide you with the care you need, when you need it.  Your next appointment:  Your physician recommends that you schedule a follow-up appointment in:9 months.

## 2019-10-19 LAB — CUP PACEART REMOTE DEVICE CHECK
Battery Remaining Longevity: 86 mo
Battery Remaining Percentage: 95.5 %
Battery Voltage: 2.99 V
Brady Statistic AP VP Percent: 95 %
Brady Statistic AP VS Percent: 1 %
Brady Statistic AS VP Percent: 5.3 %
Brady Statistic AS VS Percent: 1 %
Brady Statistic RA Percent Paced: 95 %
Date Time Interrogation Session: 20201229020015
Implantable Lead Implant Date: 20140210
Implantable Lead Implant Date: 20140210
Implantable Lead Implant Date: 20140210
Implantable Lead Location: 753858
Implantable Lead Location: 753859
Implantable Lead Location: 753860
Implantable Pulse Generator Implant Date: 20200909
Lead Channel Impedance Value: 340 Ohm
Lead Channel Impedance Value: 510 Ohm
Lead Channel Impedance Value: 830 Ohm
Lead Channel Pacing Threshold Amplitude: 0.75 V
Lead Channel Pacing Threshold Amplitude: 1 V
Lead Channel Pacing Threshold Amplitude: 1.5 V
Lead Channel Pacing Threshold Pulse Width: 0.4 ms
Lead Channel Pacing Threshold Pulse Width: 0.4 ms
Lead Channel Pacing Threshold Pulse Width: 0.4 ms
Lead Channel Sensing Intrinsic Amplitude: 3.3 mV
Lead Channel Setting Pacing Amplitude: 2 V
Lead Channel Setting Pacing Amplitude: 2.5 V
Lead Channel Setting Pacing Amplitude: 2.5 V
Lead Channel Setting Pacing Pulse Width: 0.4 ms
Lead Channel Setting Pacing Pulse Width: 0.4 ms
Lead Channel Setting Sensing Sensitivity: 4 mV
Pulse Gen Model: 3222
Pulse Gen Serial Number: 9009789

## 2019-10-24 ENCOUNTER — Telehealth: Payer: Self-pay

## 2019-10-24 LAB — CULTURE, BLOOD (SINGLE)

## 2019-10-24 NOTE — Telephone Encounter (Signed)
Spoke with pt's daughter, [name] and advised per Dr Caryl Comes study was normal.  Ms [name] states pt was started on Niacin by the hospital 06/2019. Asking if pt should continue on Niacin.  Pt's PCP authorized refills 07/2019.  Advised Ms [name] she should contact pt's PCP with questions re: Niacin.  She verbalizes understanding and agrees with plan.

## 2019-10-24 NOTE — Telephone Encounter (Signed)
-----   Message from Vicki Sprang, MD sent at 10/24/2019  2:21 PM EST ----- Please Inform Patient that study was normal  Thanks

## 2019-11-04 ENCOUNTER — Emergency Department (HOSPITAL_COMMUNITY)
Admission: EM | Admit: 2019-11-04 | Discharge: 2019-11-04 | Disposition: A | Discharge status: Home / Self Care | Payer: Medicare Other | Attending: Emergency Medicine | Admitting: Emergency Medicine

## 2019-11-04 DIAGNOSIS — I110 Hypertensive heart disease with heart failure: Secondary | ICD-10-CM | POA: Insufficient documentation

## 2019-11-04 DIAGNOSIS — I5021 Acute systolic (congestive) heart failure: Secondary | ICD-10-CM | POA: Insufficient documentation

## 2019-11-04 DIAGNOSIS — R251 Tremor, unspecified: Secondary | ICD-10-CM | POA: Insufficient documentation

## 2019-11-04 DIAGNOSIS — Z950 Presence of cardiac pacemaker: Secondary | ICD-10-CM | POA: Diagnosis not present

## 2019-11-04 DIAGNOSIS — Z794 Long term (current) use of insulin: Secondary | ICD-10-CM | POA: Diagnosis not present

## 2019-11-04 DIAGNOSIS — I2510 Atherosclerotic heart disease of native coronary artery without angina pectoris: Secondary | ICD-10-CM | POA: Diagnosis not present

## 2019-11-04 DIAGNOSIS — E119 Type 2 diabetes mellitus without complications: Secondary | ICD-10-CM | POA: Diagnosis not present

## 2019-11-04 DIAGNOSIS — Z7901 Long term (current) use of anticoagulants: Secondary | ICD-10-CM | POA: Diagnosis not present

## 2019-11-04 DIAGNOSIS — R531 Weakness: Secondary | ICD-10-CM | POA: Insufficient documentation

## 2019-11-04 LAB — CBC WITH DIFFERENTIAL/PLATELET
Abs Immature Granulocytes: 0.01 10*3/uL (ref 0.00–0.07)
Basophils Absolute: 0 10*3/uL (ref 0.0–0.1)
Basophils Relative: 1 %
Eosinophils Absolute: 0.1 10*3/uL (ref 0.0–0.5)
Eosinophils Relative: 1 %
HCT: 34.5 % — ABNORMAL LOW (ref 36.0–46.0)
Hemoglobin: 10.7 g/dL — ABNORMAL LOW (ref 12.0–15.0)
Immature Granulocytes: 0 %
Lymphocytes Relative: 31 %
Lymphs Abs: 1.9 10*3/uL (ref 0.7–4.0)
MCH: 28.8 pg (ref 26.0–34.0)
MCHC: 31 g/dL (ref 30.0–36.0)
MCV: 92.7 fL (ref 80.0–100.0)
Monocytes Absolute: 0.5 10*3/uL (ref 0.1–1.0)
Monocytes Relative: 7 %
Neutro Abs: 3.7 10*3/uL (ref 1.7–7.7)
Neutrophils Relative %: 60 %
Platelets: 221 10*3/uL (ref 150–400)
RBC: 3.72 MIL/uL — ABNORMAL LOW (ref 3.87–5.11)
RDW: 12.5 % (ref 11.5–15.5)
WBC: 6.1 10*3/uL (ref 4.0–10.5)
nRBC: 0 % (ref 0.0–0.2)

## 2019-11-04 LAB — COMPREHENSIVE METABOLIC PANEL
ALT: 21 U/L (ref 0–44)
AST: 24 U/L (ref 15–41)
Albumin: 3.6 g/dL (ref 3.5–5.0)
Alkaline Phosphatase: 61 U/L (ref 38–126)
Anion gap: 8 (ref 5–15)
BUN: 20 mg/dL (ref 8–23)
CO2: 29 mmol/L (ref 22–32)
Calcium: 11.1 mg/dL — ABNORMAL HIGH (ref 8.9–10.3)
Chloride: 104 mmol/L (ref 98–111)
Creatinine, Ser: 1.09 mg/dL — ABNORMAL HIGH (ref 0.44–1.00)
GFR calc Af Amer: 52 mL/min — ABNORMAL LOW (ref >60)
GFR calc non Af Amer: 45 mL/min — ABNORMAL LOW (ref >60)
Glucose, Bld: 144 mg/dL — ABNORMAL HIGH (ref 70–99)
Potassium: 4.3 mmol/L (ref 3.5–5.1)
Sodium: 141 mmol/L (ref 135–145)
Total Bilirubin: 0.6 mg/dL (ref 0.3–1.2)
Total Protein: 6.7 g/dL (ref 6.5–8.1)

## 2019-11-04 LAB — CBG MONITORING, ED: Glucose-Capillary: 132 mg/dL — ABNORMAL HIGH (ref 70–99)

## 2019-11-04 LAB — URINALYSIS, ROUTINE W REFLEX MICROSCOPIC
Bacteria, UA: NONE SEEN
Bilirubin Urine: NEGATIVE
Glucose, UA: NEGATIVE mg/dL
Hgb urine dipstick: NEGATIVE
Ketones, ur: NEGATIVE mg/dL
Nitrite: NEGATIVE
Protein, ur: NEGATIVE mg/dL
Specific Gravity, Urine: 1.013 (ref 1.005–1.030)
pH: 6 (ref 5.0–8.0)

## 2019-11-04 LAB — CK: Total CK: 67 U/L (ref 38–234)

## 2019-11-04 LAB — LACTIC ACID, PLASMA: Lactic Acid, Venous: 1.4 mmol/L (ref 0.5–1.9)

## 2019-11-04 NOTE — Discharge Instructions (Addendum)
Continue with the neurology follow-up as planned.  Urine test here and the blood work are unremarkable.
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
## 2019-11-04 NOTE — ED Notes (Signed)
Pt d/c home per MD order. Discharge summary reviewed with pt. Pt verbalizes understanding. Pt family here for discharge ride home. Off unit via WC.

## 2019-11-04 NOTE — ED Triage Notes (Signed)
Pt to ED via EMS from home c/o generalized weakness .HX of involuntary movements/twitching.  Alert and oriented x 4.  #20 RAC. No medications given by EMS.  QB:[id number], HTN, Last VS: 140/80, hr 80 PACED, 98% o2, 181 CBG, 98.1Temp

## 2019-11-07 ENCOUNTER — Emergency Department (HOSPITAL_COMMUNITY): Payer: Medicare Other

## 2019-11-07 ENCOUNTER — Emergency Department (HOSPITAL_COMMUNITY)
Admission: EM | Admit: 2019-11-07 | Discharge: 2019-11-07 | Disposition: A | Discharge status: Home / Self Care | Payer: Medicare Other | Attending: Emergency Medicine | Admitting: Emergency Medicine

## 2019-11-07 DIAGNOSIS — Z87891 Personal history of nicotine dependence: Secondary | ICD-10-CM | POA: Insufficient documentation

## 2019-11-07 DIAGNOSIS — Z794 Long term (current) use of insulin: Secondary | ICD-10-CM | POA: Diagnosis not present

## 2019-11-07 DIAGNOSIS — Z950 Presence of cardiac pacemaker: Secondary | ICD-10-CM | POA: Insufficient documentation

## 2019-11-07 DIAGNOSIS — E119 Type 2 diabetes mellitus without complications: Secondary | ICD-10-CM | POA: Insufficient documentation

## 2019-11-07 DIAGNOSIS — Z79899 Other long term (current) drug therapy: Secondary | ICD-10-CM | POA: Insufficient documentation

## 2019-11-07 DIAGNOSIS — I2510 Atherosclerotic heart disease of native coronary artery without angina pectoris: Secondary | ICD-10-CM | POA: Insufficient documentation

## 2019-11-07 DIAGNOSIS — I110 Hypertensive heart disease with heart failure: Secondary | ICD-10-CM | POA: Diagnosis not present

## 2019-11-07 DIAGNOSIS — R251 Tremor, unspecified: Secondary | ICD-10-CM | POA: Insufficient documentation

## 2019-11-07 DIAGNOSIS — I5021 Acute systolic (congestive) heart failure: Secondary | ICD-10-CM | POA: Insufficient documentation

## 2019-11-07 LAB — CBC WITH DIFFERENTIAL/PLATELET
Abs Immature Granulocytes: 0.01 10*3/uL (ref 0.00–0.07)
Basophils Absolute: 0 10*3/uL (ref 0.0–0.1)
Basophils Relative: 1 %
Eosinophils Absolute: 0.1 10*3/uL (ref 0.0–0.5)
Eosinophils Relative: 1 %
HCT: 35.4 % — ABNORMAL LOW (ref 36.0–46.0)
Hemoglobin: 11.3 g/dL — ABNORMAL LOW (ref 12.0–15.0)
Immature Granulocytes: 0 %
Lymphocytes Relative: 48 %
Lymphs Abs: 3.4 10*3/uL (ref 0.7–4.0)
MCH: 28.9 pg (ref 26.0–34.0)
MCHC: 31.9 g/dL (ref 30.0–36.0)
MCV: 90.5 fL (ref 80.0–100.0)
Monocytes Absolute: 0.5 10*3/uL (ref 0.1–1.0)
Monocytes Relative: 7 %
Neutro Abs: 3.1 10*3/uL (ref 1.7–7.7)
Neutrophils Relative %: 43 %
Platelets: 217 10*3/uL (ref 150–400)
RBC: 3.91 MIL/uL (ref 3.87–5.11)
RDW: 12.3 % (ref 11.5–15.5)
WBC: 7.1 10*3/uL (ref 4.0–10.5)
nRBC: 0 % (ref 0.0–0.2)

## 2019-11-07 LAB — BASIC METABOLIC PANEL
Anion gap: 8 (ref 5–15)
BUN: 15 mg/dL (ref 8–23)
CO2: 29 mmol/L (ref 22–32)
Calcium: 11.2 mg/dL — ABNORMAL HIGH (ref 8.9–10.3)
Chloride: 100 mmol/L (ref 98–111)
Creatinine, Ser: 1.24 mg/dL — ABNORMAL HIGH (ref 0.44–1.00)
GFR calc Af Amer: 45 mL/min — ABNORMAL LOW (ref >60)
GFR calc non Af Amer: 38 mL/min — ABNORMAL LOW (ref >60)
Glucose, Bld: 115 mg/dL — ABNORMAL HIGH (ref 70–99)
Potassium: 4.2 mmol/L (ref 3.5–5.1)
Sodium: 137 mmol/L (ref 135–145)

## 2019-11-07 LAB — MAGNESIUM: Magnesium: 1.8 mg/dL (ref 1.7–2.4)
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
## 2019-11-07 NOTE — ED Notes (Signed)
Communicated with daughter to have pt picked up by family. Approx 15 min out and will call me on arrival.

## 2019-11-07 NOTE — ED Notes (Signed)
Spoke with pt and daughter regarding follow up and d/c instructions. Time was given for questions and no additional questions were asked.

## 2019-11-07 NOTE — ED Triage Notes (Signed)
Pt presents to the ED from her own home via [facility]. EMS reports the patient has been having tremors and difficulty speaking on and off for about 2 weeks now. Was recently seen here in the ED for same. Family reports to EMS that patients mental status is at her baseline. Pt arrives today alert and oriented to person, place and time. Pt reporting that she "is stuttering" when she tries to talk. VSS.

## 2019-11-22 ENCOUNTER — Ambulatory Visit: Payer: Medicare Other | Admitting: Neurology

## 2019-12-06 ENCOUNTER — Emergency Department (HOSPITAL_COMMUNITY): Payer: Medicare Other

## 2019-12-06 ENCOUNTER — Inpatient Hospital Stay (HOSPITAL_COMMUNITY)
Admission: EM | Admit: 2019-12-06 | Discharge: 2019-12-14 | DRG: 065 | Disposition: A | Discharge status: Rehabilitation Facility | Payer: Medicare Other | Attending: Internal Medicine | Admitting: Internal Medicine

## 2019-12-06 ENCOUNTER — Encounter (HOSPITAL_COMMUNITY): Payer: Self-pay | Admitting: Emergency Medicine

## 2019-12-06 DIAGNOSIS — E119 Type 2 diabetes mellitus without complications: Secondary | ICD-10-CM | POA: Diagnosis not present

## 2019-12-06 DIAGNOSIS — E1169 Type 2 diabetes mellitus with other specified complication: Secondary | ICD-10-CM | POA: Diagnosis not present

## 2019-12-06 DIAGNOSIS — Z833 Family history of diabetes mellitus: Secondary | ICD-10-CM | POA: Diagnosis not present

## 2019-12-06 DIAGNOSIS — R531 Weakness: Secondary | ICD-10-CM | POA: Diagnosis not present

## 2019-12-06 DIAGNOSIS — Z66 Do not resuscitate: Secondary | ICD-10-CM | POA: Diagnosis present

## 2019-12-06 DIAGNOSIS — Z8673 Personal history of transient ischemic attack (TIA), and cerebral infarction without residual deficits: Secondary | ICD-10-CM

## 2019-12-06 DIAGNOSIS — E785 Hyperlipidemia, unspecified: Secondary | ICD-10-CM | POA: Diagnosis present

## 2019-12-06 DIAGNOSIS — R0682 Tachypnea, not elsewhere classified: Secondary | ICD-10-CM

## 2019-12-06 DIAGNOSIS — N390 Urinary tract infection, site not specified: Secondary | ICD-10-CM | POA: Diagnosis not present

## 2019-12-06 DIAGNOSIS — Z955 Presence of coronary angioplasty implant and graft: Secondary | ICD-10-CM

## 2019-12-06 DIAGNOSIS — N179 Acute kidney failure, unspecified: Secondary | ICD-10-CM | POA: Diagnosis present

## 2019-12-06 DIAGNOSIS — R29810 Facial weakness: Secondary | ICD-10-CM | POA: Diagnosis present

## 2019-12-06 DIAGNOSIS — D638 Anemia in other chronic diseases classified elsewhere: Secondary | ICD-10-CM | POA: Diagnosis not present

## 2019-12-06 DIAGNOSIS — I255 Ischemic cardiomyopathy: Secondary | ICD-10-CM | POA: Diagnosis present

## 2019-12-06 DIAGNOSIS — I5042 Chronic combined systolic (congestive) and diastolic (congestive) heart failure: Secondary | ICD-10-CM | POA: Diagnosis present

## 2019-12-06 DIAGNOSIS — I69391 Dysphagia following cerebral infarction: Secondary | ICD-10-CM | POA: Diagnosis not present

## 2019-12-06 DIAGNOSIS — Z20822 Contact with and (suspected) exposure to covid-19: Secondary | ICD-10-CM | POA: Diagnosis present

## 2019-12-06 DIAGNOSIS — N1832 Chronic kidney disease, stage 3b: Secondary | ICD-10-CM | POA: Diagnosis not present

## 2019-12-06 DIAGNOSIS — R4701 Aphasia: Secondary | ICD-10-CM | POA: Diagnosis present

## 2019-12-06 DIAGNOSIS — E870 Hyperosmolality and hypernatremia: Secondary | ICD-10-CM | POA: Diagnosis not present

## 2019-12-06 DIAGNOSIS — H409 Unspecified glaucoma: Secondary | ICD-10-CM | POA: Diagnosis present

## 2019-12-06 DIAGNOSIS — E876 Hypokalemia: Secondary | ICD-10-CM | POA: Diagnosis not present

## 2019-12-06 DIAGNOSIS — Z8249 Family history of ischemic heart disease and other diseases of the circulatory system: Secondary | ICD-10-CM | POA: Diagnosis not present

## 2019-12-06 DIAGNOSIS — G8191 Hemiplegia, unspecified affecting right dominant side: Secondary | ICD-10-CM | POA: Diagnosis present

## 2019-12-06 DIAGNOSIS — I130 Hypertensive heart and chronic kidney disease with heart failure and stage 1 through stage 4 chronic kidney disease, or unspecified chronic kidney disease: Secondary | ICD-10-CM | POA: Diagnosis present

## 2019-12-06 DIAGNOSIS — N1831 Chronic kidney disease, stage 3a: Secondary | ICD-10-CM | POA: Diagnosis present

## 2019-12-06 DIAGNOSIS — E669 Obesity, unspecified: Secondary | ICD-10-CM | POA: Diagnosis not present

## 2019-12-06 DIAGNOSIS — Z7902 Long term (current) use of antithrombotics/antiplatelets: Secondary | ICD-10-CM

## 2019-12-06 DIAGNOSIS — Z950 Presence of cardiac pacemaker: Secondary | ICD-10-CM | POA: Diagnosis not present

## 2019-12-06 DIAGNOSIS — I2510 Atherosclerotic heart disease of native coronary artery without angina pectoris: Secondary | ICD-10-CM | POA: Diagnosis present

## 2019-12-06 DIAGNOSIS — N182 Chronic kidney disease, stage 2 (mild): Secondary | ICD-10-CM | POA: Diagnosis not present

## 2019-12-06 DIAGNOSIS — Z794 Long term (current) use of insulin: Secondary | ICD-10-CM | POA: Diagnosis not present

## 2019-12-06 DIAGNOSIS — Z809 Family history of malignant neoplasm, unspecified: Secondary | ICD-10-CM

## 2019-12-06 DIAGNOSIS — Z87891 Personal history of nicotine dependence: Secondary | ICD-10-CM

## 2019-12-06 DIAGNOSIS — R29701 NIHSS score 1: Secondary | ICD-10-CM | POA: Diagnosis present

## 2019-12-06 DIAGNOSIS — D72829 Elevated white blood cell count, unspecified: Secondary | ICD-10-CM | POA: Diagnosis not present

## 2019-12-06 DIAGNOSIS — M25569 Pain in unspecified knee: Secondary | ICD-10-CM

## 2019-12-06 DIAGNOSIS — E1159 Type 2 diabetes mellitus with other circulatory complications: Secondary | ICD-10-CM | POA: Diagnosis not present

## 2019-12-06 DIAGNOSIS — A499 Bacterial infection, unspecified: Secondary | ICD-10-CM | POA: Diagnosis not present

## 2019-12-06 DIAGNOSIS — R7989 Other specified abnormal findings of blood chemistry: Secondary | ICD-10-CM | POA: Diagnosis not present

## 2019-12-06 DIAGNOSIS — R1310 Dysphagia, unspecified: Secondary | ICD-10-CM | POA: Diagnosis not present

## 2019-12-06 DIAGNOSIS — R471 Dysarthria and anarthria: Secondary | ICD-10-CM | POA: Diagnosis present

## 2019-12-06 DIAGNOSIS — I10 Essential (primary) hypertension: Secondary | ICD-10-CM | POA: Diagnosis not present

## 2019-12-06 DIAGNOSIS — I361 Nonrheumatic tricuspid (valve) insufficiency: Secondary | ICD-10-CM | POA: Diagnosis not present

## 2019-12-06 DIAGNOSIS — E1165 Type 2 diabetes mellitus with hyperglycemia: Secondary | ICD-10-CM | POA: Diagnosis present

## 2019-12-06 DIAGNOSIS — G459 Transient cerebral ischemic attack, unspecified: Secondary | ICD-10-CM | POA: Diagnosis not present

## 2019-12-06 DIAGNOSIS — E1122 Type 2 diabetes mellitus with diabetic chronic kidney disease: Secondary | ICD-10-CM | POA: Diagnosis present

## 2019-12-06 DIAGNOSIS — I639 Cerebral infarction, unspecified: Secondary | ICD-10-CM | POA: Diagnosis not present

## 2019-12-06 DIAGNOSIS — K219 Gastro-esophageal reflux disease without esophagitis: Secondary | ICD-10-CM | POA: Diagnosis present

## 2019-12-06 DIAGNOSIS — I442 Atrioventricular block, complete: Secondary | ICD-10-CM | POA: Diagnosis present

## 2019-12-06 DIAGNOSIS — E7800 Pure hypercholesterolemia, unspecified: Secondary | ICD-10-CM | POA: Diagnosis present

## 2019-12-06 DIAGNOSIS — R112 Nausea with vomiting, unspecified: Secondary | ICD-10-CM

## 2019-12-06 DIAGNOSIS — R296 Repeated falls: Secondary | ICD-10-CM

## 2019-12-06 DIAGNOSIS — Z8349 Family history of other endocrine, nutritional and metabolic diseases: Secondary | ICD-10-CM

## 2019-12-06 DIAGNOSIS — G4733 Obstructive sleep apnea (adult) (pediatric): Secondary | ICD-10-CM | POA: Diagnosis not present

## 2019-12-06 DIAGNOSIS — M1990 Unspecified osteoarthritis, unspecified site: Secondary | ICD-10-CM | POA: Diagnosis present

## 2019-12-06 DIAGNOSIS — I5032 Chronic diastolic (congestive) heart failure: Secondary | ICD-10-CM | POA: Diagnosis not present

## 2019-12-06 DIAGNOSIS — E1151 Type 2 diabetes mellitus with diabetic peripheral angiopathy without gangrene: Secondary | ICD-10-CM | POA: Diagnosis present

## 2019-12-06 DIAGNOSIS — D631 Anemia in chronic kidney disease: Secondary | ICD-10-CM | POA: Diagnosis present

## 2019-12-06 DIAGNOSIS — R1312 Dysphagia, oropharyngeal phase: Secondary | ICD-10-CM | POA: Diagnosis not present

## 2019-12-06 DIAGNOSIS — R29898 Other symptoms and signs involving the musculoskeletal system: Secondary | ICD-10-CM

## 2019-12-06 DIAGNOSIS — L8990 Pressure ulcer of unspecified site, unspecified stage: Secondary | ICD-10-CM | POA: Insufficient documentation

## 2019-12-06 DIAGNOSIS — B9689 Other specified bacterial agents as the cause of diseases classified elsewhere: Secondary | ICD-10-CM | POA: Diagnosis not present

## 2019-12-06 DIAGNOSIS — L89611 Pressure ulcer of right heel, stage 1: Secondary | ICD-10-CM | POA: Diagnosis present

## 2019-12-06 DIAGNOSIS — I340 Nonrheumatic mitral (valve) insufficiency: Secondary | ICD-10-CM | POA: Diagnosis not present

## 2019-12-06 LAB — PROTIME-INR
INR: 1 (ref 0.8–1.2)
Prothrombin Time: 13.3 seconds (ref 11.4–15.2)

## 2019-12-06 LAB — COMPREHENSIVE METABOLIC PANEL
ALT: 27 U/L (ref 0–44)
AST: 24 U/L (ref 15–41)
Albumin: 3.7 g/dL (ref 3.5–5.0)
Alkaline Phosphatase: 67 U/L (ref 38–126)
Anion gap: 10 (ref 5–15)
BUN: 21 mg/dL (ref 8–23)
CO2: 25 mmol/L (ref 22–32)
Calcium: 11.1 mg/dL — ABNORMAL HIGH (ref 8.9–10.3)
Chloride: 104 mmol/L (ref 98–111)
Creatinine, Ser: 1.62 mg/dL — ABNORMAL HIGH (ref 0.44–1.00)
GFR calc Af Amer: 32 mL/min — ABNORMAL LOW (ref >60)
GFR calc non Af Amer: 28 mL/min — ABNORMAL LOW (ref >60)
Glucose, Bld: 183 mg/dL — ABNORMAL HIGH (ref 70–99)
Potassium: 4.1 mmol/L (ref 3.5–5.1)
Sodium: 139 mmol/L (ref 135–145)
Total Bilirubin: 0.7 mg/dL (ref 0.3–1.2)
Total Protein: 7 g/dL (ref 6.5–8.1)

## 2019-12-06 LAB — CBC
HCT: 34.9 % — ABNORMAL LOW (ref 36.0–46.0)
Hemoglobin: 11 g/dL — ABNORMAL LOW (ref 12.0–15.0)
MCH: 28.9 pg (ref 26.0–34.0)
MCHC: 31.5 g/dL (ref 30.0–36.0)
MCV: 91.6 fL (ref 80.0–100.0)
Platelets: 237 10*3/uL (ref 150–400)
RBC: 3.81 MIL/uL — ABNORMAL LOW (ref 3.87–5.11)
RDW: 12.5 % (ref 11.5–15.5)
WBC: 6.2 10*3/uL (ref 4.0–10.5)
nRBC: 0 % (ref 0.0–0.2)

## 2019-12-06 LAB — I-STAT BETA HCG BLOOD, ED (MC, WL, AP ONLY): I-stat hCG, quantitative: <5 m[IU]/mL (ref <5)

## 2019-12-06 LAB — DIFFERENTIAL
Abs Immature Granulocytes: 0.01 10*3/uL (ref 0.00–0.07)
Basophils Absolute: 0 10*3/uL (ref 0.0–0.1)
Basophils Relative: 1 %
Eosinophils Absolute: 0 10*3/uL (ref 0.0–0.5)
Eosinophils Relative: 1 %
Immature Granulocytes: 0 %
Lymphocytes Relative: 36 %
Lymphs Abs: 2.2 10*3/uL (ref 0.7–4.0)
Monocytes Absolute: 0.5 10*3/uL (ref 0.1–1.0)
Monocytes Relative: 9 %
Neutro Abs: 3.3 10*3/uL (ref 1.7–7.7)
Neutrophils Relative %: 53 %

## 2019-12-06 LAB — APTT: aPTT: 32 seconds (ref 24–36)

## 2019-12-06 MED ORDER — ACETAMINOPHEN 160 MG/5ML PO SOLN: 650.0000 mg | ORAL | Status: DC | PRN

## 2019-12-06 MED ORDER — STROKE: EARLY STAGES OF RECOVERY BOOK
Freq: Once | Status: AC
  Filled 2019-12-06 (×2): qty 1

## 2019-12-06 MED ORDER — ENOXAPARIN SODIUM 30 MG/0.3ML ~~LOC~~ SOLN
30.0000 mg | Freq: Every day | SUBCUTANEOUS | Status: DC
  Administered 2019-12-07 – 2019-12-14 (×8): 30 mg via SUBCUTANEOUS
  Filled 2019-12-06 (×8): qty 0.3

## 2019-12-06 MED ORDER — SODIUM CHLORIDE 0.9% FLUSH
3.0000 mL | Freq: Once | INTRAVENOUS | Status: AC
  Administered 2019-12-12: 3 mL via INTRAVENOUS

## 2019-12-06 MED ORDER — ACETAMINOPHEN 325 MG PO TABS
650.0000 mg | ORAL_TABLET | ORAL | Status: DC | PRN
  Administered 2019-12-07: 650 mg via ORAL

## 2019-12-06 MED ORDER — ACETAMINOPHEN 650 MG RE SUPP: 650.0000 mg | RECTAL | Status: DC | PRN

## 2019-12-06 NOTE — ED Triage Notes (Signed)
BIB EMS from home. Per daughter pt had episode of slurred speech, L sided weakness 1530. All S/S have resolved since. According to pt she only had R leg weakness. Pt is A/OX4 with no neuro deficits at this time. VSS.
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
## 2019-12-06 NOTE — ED Notes (Signed)
Pt passed stroke swallow screen with no difficulties.

## 2019-12-06 NOTE — H&P (Signed)
History and Physical    [name] ZOX:[id number] DOB: [birth year] DOA: 12/06/2019  PCP: [name], MD  Patient coming from: home  I have personally briefly reviewed patient's old medical records in [place]  Chief Complaint: fall and right sided weakness  HPI: [name] is a 86 y.o. female with medical history significant for CAD, CVA on Plavix, heart block s/p pacemaker, hypertension, Type 2 diabetes, OSA on CPAP who presents for concerns of a fall. Patient was unable to recall much of her symptoms and there was no family at bedside. Patient states that today she was walking with her cane and when she turned she fell onto the ground and hit her head.  Leg weakness.  She denies any dizziness or loss of consciousness.  Thinks she might have felt another time prior to this but could not recall the event. Per ED documentation, daughter brought patient and due to concerns of slurred speech and left-sided weakness at 1530.  All symptoms resolved in the ED.  In the ER, she was afebrile and mildly hypertensive up to 150s over 100 on room air. CBC showed no leukocytosis and stable anemia of 11.  Creatinine elevated at 1.62 from a prior of 1.24.  CT head negative for any acute abnormalities.  ED physician discussed with neurology who recommends admission to hospitalist and for either MRI if pacemaker MRI compatible we will repeat CT scan in a few days to look for old stroke.    Review of Systems: Constitutional: No Weight Change, No Fever ENT/Mouth: No sore throat, No Rhinorrhea Eyes: No Eye Pain, No Vision Changes Cardiovascular: No Chest Pain, no SOB, No Palpitations Respiratory: No Cough, No Sputum, No Wheezing, no Dyspnea  Gastrointestinal: No Nausea, No Vomiting, No Diarrhea, No Constipation, No Pain Genitourinary: no Urinary Incontinence, No Urgency, No Flank Pain Musculoskeletal: No Arthralgias, No Myalgias Skin: No Skin Lesions, No Pruritus, Neuro: +  Weakness, No Numbness,  No Loss of Consciousness, No Syncope Psych: No Anxiety/Panic, No Depression, no decrease appetite Heme/Lymph: No Bruising, No Bleeding  Past Medical History:  Diagnosis Date  . Anemia   . Arthritis   . Celiac disease   . Chronic systolic CHF (congestive heart failure) ([place])   . Complete heart block ([place])   . Coronary artery disease   . Diabetes mellitus    INSULIN DEPENDENT  . GERD (gastroesophageal reflux disease)   . Glaucoma   . Headache(784.0)   . Heart disease   . Hypercholesterolemia   . Hypertension   . Iron deficiency anemia, unspecified   . Ischemic cardiomyopathy   . Memory difficulties 04/14/2019  . Shortness of breath   . Sleep apnea    uses cpap  . Stroke ([place])   . Unspecified constipation     Past Surgical History:  Procedure Laterality Date  . BACK SURGERY    . BI-VENTRICULAR PACEMAKER INSERTION (CRT-P)  March 2014   [facility]  . BIV PACEMAKER GENERATOR CHANGEOUT N/A 06/29/2019   Procedure: BIV PACEMAKER GENERATOR CHANGEOUT;  Surgeon: Evans Lance, MD;  Location: [place] [facility];  Service: Cardiovascular;  Laterality: N/A;  . CARDIAC CATHETERIZATION  09/02/2012  . CARDIAC SURGERY    . CORONARY ANGIOPLASTY WITH STENT PLACEMENT  09/02/2012   RCA  . PERCUTANEOUS CORONARY STENT INTERVENTION (PCI-S) N/A 09/02/2012   Procedure: PERCUTANEOUS CORONARY STENT INTERVENTION (PCI-S);  Surgeon: Clent Demark, MD;  Location[facility] CATH LAB;  Service: Cardiovascular;  Laterality: N/A;  . PERMANENT PACEMAKER  INSERTION N/A 11/29/2012   Procedure: PERMANENT PACEMAKER INSERTION;  Surgeon: Deboraha Sprang, MD;  Location: [facility] CATH LAB;  Service: Cardiovascular;  Laterality: N/A;  . TEMPORARY PACEMAKER INSERTION N/A 11/28/2012   Procedure: TEMPORARY PACEMAKER INSERTION;  Surgeon: Clent Demark, MD;  Location: [place] CATH LAB;  Service: Cardiovascular;  Laterality: N/A;     reports that she quit smoking about 42 years ago. She has never used smokeless  tobacco. She reports that she does not drink alcohol or use drugs.  No Known Allergies  Family History  Problem Relation Age of Onset  . Diabetes Mother   . Hypertension Mother   . Hyperlipidemia Mother   . Cancer Sister   . Dementia Sister   . Neuropathy Sister      Prior to Admission medications   Medication Sig Start Date End Date Taking? Authorizing Provider  amLODipine (NORVASC) 2.5 MG tablet Take 2.5 mg by mouth every morning.   Yes [provider]  atorvastatin (LIPITOR) 80 MG tablet Take 80 mg by mouth every morning.  04/17/15  Yes [provider]  brimonidine-timolol (COMBIGAN) 0.2-0.5 % ophthalmic solution Place 1 drop into both eyes 2 (two) times daily.   Yes [provider]  calcitRIOL (ROCALTROL) 0.25 MCG capsule Take 0.25 mcg by mouth every morning.   Yes [provider]  carvedilol (COREG) 3.125 MG tablet Take 3.125 mg by mouth 2 (two) times daily. 04/17/15  Yes [provider]  clopidogrel (PLAVIX) 75 MG tablet Take 1 tablet (75 mg total) by mouth daily. 07/03/19  Yes Thurnell Lose, MD  [facility] Liver Oil CAPS Take 1 capsule by mouth at bedtime.    Yes [provider]  donepezil (ARICEPT) 10 MG tablet Take 10 mg by mouth at bedtime.  11/07/19  Yes [provider]  dorzolamide (TRUSOPT) 2 % ophthalmic solution Place 1 drop into both eyes 2 (two) times daily.  07/28/13  Yes [provider]  ferrous sulfate 325 (65 FE) MG tablet Take 325 mg by mouth daily with breakfast.   Yes [provider]  insulin degludec (TRESIBA FLEXTOUCH) 100 UNIT/ML SOPN FlexTouch Pen Inject 10-20 Units into the skin See admin instructions. Inject 10-20 units into the skin at bedtime, based upon CGB (home capillary blood glucose)   Yes [provider]  Insulin Lispro (HUMALOG KWIKPEN) 200 UNIT/ML SOPN Inject 6 Units into the skin 3 (three) times daily as needed (high blood sugar).    Yes [provider]   latanoprost (XALATAN) 0.005 % ophthalmic solution Appointment OVERDUE, place 1 drop into both eyes daily at bedtime Patient taking differently: Place 1 drop into both eyes at bedtime.  03/02/14  Yes Blanchie Serve, MD  linagliptin (TRADJENTA) 5 MG TABS tablet Take 5 mg by mouth every morning.   Yes [provider]  loperamide (IMODIUM) 2 MG capsule Take 2 mg by mouth as needed for diarrhea or loose stools.   Yes [provider]  Multiple Vitamin (MULTIVITAMIN WITH MINERALS) TABS Take 1 tablet by mouth at bedtime.    Yes [provider]  Multiple Vitamins-Minerals (EYE VITAMINS) CAPS Take 1 capsule by mouth every morning.   Yes [provider]  nitroGLYCERIN (NITROSTAT) 0.4 MG SL tablet Place 0.4 mg under the tongue every 5 (five) minutes as needed for chest pain.  09/03/12  Yes Charolette Forward, MD  pantoprazole (PROTONIX) 40 MG tablet Take 40 mg by mouth daily before breakfast.  05/07/15  Yes [provider]  valsartan-hydrochlorothiazide (DIOVAN-HCT) 320-12.5 MG tablet Take 1 tablet by mouth every morning.    Yes [provider]  NOVOFINE 32G X 6 MM MISC  01/25/13   [provider]    Physical Exam: Vitals:   12/06/19 2145 12/06/19 2200 12/06/19 2300 12/06/19 2312  BP: (!) 157/95 (!) 154/128  (!) 136/116  Pulse: 79 74 73 77  Resp: (!) 21 13 14 15   Temp:      TempSrc:      SpO2: 98% 98% 98% 100%    Constitutional: NAD, calm, comfortable, obese female laying flat in bed Vitals:   12/06/19 2145 12/06/19 2200 12/06/19 2300 12/06/19 2312  BP: (!) 157/95 (!) 154/128  (!) 136/116  Pulse: 79 74 73 77  Resp: (!) 21 13 14 15   Temp:      TempSrc:      SpO2: 98% 98% 98% 100%   Eyes: PERRL, lids and conjunctivae normal ENMT: Mucous membranes are moist.  Neck: normal, supple Respiratory: clear to auscultation bilaterally, no wheezing, no crackles. Normal respiratory effort.  Cardiovascular: Regular rate and rhythm, no murmurs / rubs  / gallops. No extremity edema. 2+ pedal pulses. No carotid bruits.  Abdomen: no tenderness, no masses palpated.  Bowel sounds positive.  Musculoskeletal: no clubbing / cyanosis. No joint deformity upper and lower extremities. Good ROM, no contractures. Normal muscle tone.  Skin: no rashes, lesions, ulcers. No induration Neurologic: No facial asymmetry.  CN 2-12 grossly intact. Sensation intact. Strength 4/5 in lower extremity. Psychiatric: Normal judgment and insight. Alert and oriented x 3 but unable to recall much her symptoms. Normal mood.    Labs on Admission: I have personally reviewed following labs and imaging studies  CBC: Recent Labs  Lab 12/06/19 1650  WBC 6.2  NEUTROABS 3.3  HGB 11.0*  HCT 34.9*  MCV 91.6  PLT 820   Basic Metabolic Panel: Recent Labs  Lab 12/06/19 1650  NA 139  K 4.1  CL 104  CO2 25  GLUCOSE 183*  BUN 21  CREATININE 1.62*  CALCIUM 11.1*   GFR: CrCl cannot be calculated (Unknown ideal weight.). Liver Function Tests: Recent Labs  Lab 12/06/19 1650  AST 24  ALT 27  ALKPHOS 67  BILITOT 0.7  PROT 7.0  ALBUMIN 3.7   No results for input(s): LIPASE, AMYLASE in the last 168 hours. No results for input(s): AMMONIA in the last 168 hours. Coagulation Profile: Recent Labs  Lab 12/06/19 1650  INR 1.0   Cardiac Enzymes: No results for input(s): CKTOTAL, CKMB, CKMBINDEX, TROPONINI in the last 168 hours. BNP (last 3 results) No results for input(s): PROBNP in the last 8760 hours. HbA1C: No results for input(s): HGBA1C in the last 72 hours. CBG: No results for input(s): GLUCAP in the last 168 hours. Lipid Profile: No results for input(s): CHOL, HDL, LDLCALC, TRIG, CHOLHDL, LDLDIRECT in the last 72 hours. Thyroid Function Tests: No results for input(s): TSH, T4TOTAL, FREET4, T3FREE, THYROIDAB in the last 72 hours. Anemia Panel: No results for input(s): VITAMINB12, FOLATE, FERRITIN, TIBC, IRON, RETICCTPCT in the last 72 hours. Urine  analysis:    Component Value Date/Time   COLORURINE YELLOW 11/04/2019 [place] 11/04/2019 1533   LABSPEC 1.013 11/04/2019 1533   PHURINE 6.0 11/04/2019 [place] 11/04/2019 1533   HGBUR NEGATIVE 11/04/2019 [place] 11/04/2019 1533   [place] 11/04/2019 1533   PROTEINUR NEGATIVE 11/04/2019 1533   UROBILINOGEN 0.2 08/07/2015 1826  NITRITE NEGATIVE 11/04/2019 1533   LEUKOCYTESUR MODERATE (A) 11/04/2019 1533    Radiological Exams on Admission: CT HEAD WO CONTRAST  Result Date: 12/06/2019 CLINICAL DATA:  Possible stroke EXAM: CT HEAD WITHOUT CONTRAST TECHNIQUE: Contiguous axial images were obtained from the base of the skull through the vertex without intravenous contrast. COMPARISON:  CT head 11/07/2019 FINDINGS: Brain: Moderate atrophy. Mild chronic microvascular ischemic changes in the white matter. Negative for acute infarct, hemorrhage, mass.  No midline shift. Vascular: Negative for hyperdense vessel. Atherosclerotic calcification. Skull: Negative Sinuses/Orbits: Paranasal sinuses show mild mucosal edema. Bilateral ocular surgery Other: None IMPRESSION: Atrophy and chronic microvascular ischemia. No acute intracranial abnormality. Electronically Signed   By: Franchot Gallo M.D.   On: 12/06/2019 19:10   DG Hip Unilat W or Wo Pelvis 2-3 Views Right  Result Date: 12/06/2019 CLINICAL DATA:  86 year old female status post fall with right hip pain. EXAM: DG HIP (WITH OR WITHOUT PELVIS) 2-3V RIGHT COMPARISON:  Right hip series 12/15/2003. FINDINGS: Chronic fibroid uterus with dystrophic calcifications. Femoral heads are normally located. Hip joint spaces appear symmetric. The proximal right femur appears intact. The proximal left femur appear symmetric and grossly intact. No pelvic fracture is identified. Mild spurring at the pubic symphysis is chronic. Negative visible bowel gas pattern. Calcified femoral artery atherosclerosis.  IMPRESSION: No acute fracture or dislocation identified about the right hip or pelvis. Electronically Signed   By: Genevie Ann M.D.   On: 12/06/2019 20:55    EKG: Independently reviewed.   Assessment/Plan  LE weakness and slurred speech - Unfortunately, Patient is unable to get an MRI due to incompatible pacemaker- Neurology recommends repeat CT head in a few days- will need to touch base in the morning for exact timeline. - echocardiogram  - continue daily Plavix and atorvastatin -Obtain A1c and lipids -PT/OT/SLT -Frequent neuro checks and keep on telemetry -Allow for permissive hypertension with blood pressure treatment as needed only if systolic goes above 616  Chronic anemia  Stable   Acute kidney injury  Creatinine of 1.62 from baseline of around 1 Monitor after fluids- 500cc bolus Avoid nephrotoxic agents   Type 2 diabetes Moderate SSI  Heart block s/p pacemaker stable  HTN Hold meds for permissive HTN  OSA Continue CPAP  DVT prophylaxis:.Lovenox Code Status: DNR Family Communication: Plan discussed with patient at bedside. No family at bedside. disposition Plan: Home with at least 2 midnight stays  Consults called:  Admission status: inpatient with at least 2 midnight stay for stroke work-up and PT/OT eval.   [name] DO [facility]   If 7PM-7AM, please contact night-coverage [email or web address]   12/06/2019, 11:40 PM

## 2019-12-06 NOTE — ED Notes (Signed)
Pt to XR

## 2019-12-06 NOTE — ED Notes (Signed)
Beverly (Daughter#[phone]) called for an update.  Thank you

## 2019-12-06 NOTE — ED Notes (Addendum)
RN spoke with MRI. Per MRI Tech, if pt. has ST [place] CRT-P, it is not safe for scan.   Admitting Dr. Flossie Buffy informed

## 2019-12-07 ENCOUNTER — Encounter (HOSPITAL_COMMUNITY): Payer: Medicare Other

## 2019-12-07 ENCOUNTER — Inpatient Hospital Stay (HOSPITAL_COMMUNITY): Payer: Medicare Other

## 2019-12-07 DIAGNOSIS — I340 Nonrheumatic mitral (valve) insufficiency: Secondary | ICD-10-CM

## 2019-12-07 DIAGNOSIS — G459 Transient cerebral ischemic attack, unspecified: Secondary | ICD-10-CM

## 2019-12-07 DIAGNOSIS — Z950 Presence of cardiac pacemaker: Secondary | ICD-10-CM

## 2019-12-07 DIAGNOSIS — I361 Nonrheumatic tricuspid (valve) insufficiency: Secondary | ICD-10-CM

## 2019-12-07 LAB — URINALYSIS, ROUTINE W REFLEX MICROSCOPIC
Bilirubin Urine: NEGATIVE
Glucose, UA: NEGATIVE mg/dL
Hgb urine dipstick: NEGATIVE
Ketones, ur: NEGATIVE mg/dL
Nitrite: NEGATIVE
Protein, ur: NEGATIVE mg/dL
Specific Gravity, Urine: 1.005 (ref 1.005–1.030)
pH: 6 (ref 5.0–8.0)

## 2019-12-07 LAB — ECHOCARDIOGRAM COMPLETE: Weight: 2779.56 oz

## 2019-12-07 LAB — BASIC METABOLIC PANEL
Anion gap: 13 (ref 5–15)
BUN: 17 mg/dL (ref 8–23)
CO2: 25 mmol/L (ref 22–32)
Calcium: 10.7 mg/dL — ABNORMAL HIGH (ref 8.9–10.3)
Chloride: 103 mmol/L (ref 98–111)
Creatinine, Ser: 1.24 mg/dL — ABNORMAL HIGH (ref 0.44–1.00)
GFR calc Af Amer: 45 mL/min — ABNORMAL LOW (ref >60)
GFR calc non Af Amer: 38 mL/min — ABNORMAL LOW (ref >60)
Glucose, Bld: 152 mg/dL — ABNORMAL HIGH (ref 70–99)
Potassium: 3.9 mmol/L (ref 3.5–5.1)
Sodium: 141 mmol/L (ref 135–145)

## 2019-12-07 LAB — LIPID PANEL
Cholesterol: 185 mg/dL (ref 0–200)
HDL: 51 mg/dL (ref >40)
LDL Cholesterol: 119 mg/dL — ABNORMAL HIGH (ref 0–99)
Total CHOL/HDL Ratio: 3.6 RATIO
Triglycerides: 74 mg/dL (ref <150)
VLDL: 15 mg/dL (ref 0–40)

## 2019-12-07 LAB — GLUCOSE, CAPILLARY
Glucose-Capillary: 142 mg/dL — ABNORMAL HIGH (ref 70–99)
Glucose-Capillary: 143 mg/dL — ABNORMAL HIGH (ref 70–99)
Glucose-Capillary: 223 mg/dL — ABNORMAL HIGH (ref 70–99)
Glucose-Capillary: 125 mg/dL — ABNORMAL HIGH (ref 70–99)
Glucose-Capillary: 129 mg/dL — ABNORMAL HIGH (ref 70–99)

## 2019-12-07 LAB — CBG MONITORING, ED: Glucose-Capillary: 134 mg/dL — ABNORMAL HIGH (ref 70–99)

## 2019-12-07 LAB — CBC WITH DIFFERENTIAL/PLATELET
Abs Immature Granulocytes: 0.01 10*3/uL (ref 0.00–0.07)
Basophils Absolute: 0 10*3/uL (ref 0.0–0.1)
Basophils Relative: 0 %
Eosinophils Absolute: 0.1 10*3/uL (ref 0.0–0.5)
Eosinophils Relative: 1 %
HCT: 34.9 % — ABNORMAL LOW (ref 36.0–46.0)
Hemoglobin: 10.8 g/dL — ABNORMAL LOW (ref 12.0–15.0)
Immature Granulocytes: 0 %
Lymphocytes Relative: 50 %
Lymphs Abs: 3.4 10*3/uL (ref 0.7–4.0)
MCH: 28.8 pg (ref 26.0–34.0)
MCHC: 30.9 g/dL (ref 30.0–36.0)
MCV: 93.1 fL (ref 80.0–100.0)
Monocytes Absolute: 0.6 10*3/uL (ref 0.1–1.0)
Monocytes Relative: 8 %
Neutro Abs: 2.8 10*3/uL (ref 1.7–7.7)
Neutrophils Relative %: 41 %
Platelets: 199 10*3/uL (ref 150–400)
RBC: 3.75 MIL/uL — ABNORMAL LOW (ref 3.87–5.11)
RDW: 12.4 % (ref 11.5–15.5)
WBC: 6.8 10*3/uL (ref 4.0–10.5)
nRBC: 0 % (ref 0.0–0.2)

## 2019-12-07 LAB — HEMOGLOBIN A1C
Hgb A1c MFr Bld: 8.2 % — ABNORMAL HIGH (ref 4.8–5.6)
Mean Plasma Glucose: 188.64 mg/dL

## 2019-12-07 LAB — SARS CORONAVIRUS 2 (TAT 6-24 HRS): SARS Coronavirus 2: NEGATIVE

## 2019-12-07 MED ORDER — INSULIN ASPART 100 UNIT/ML ~~LOC~~ SOLN
0.0000 [IU] | Freq: Three times a day (TID) | SUBCUTANEOUS | Status: DC
  Administered 2019-12-07 (×3): 2 [IU] via SUBCUTANEOUS
  Administered 2019-12-08 (×3): 3 [IU] via SUBCUTANEOUS
  Administered 2019-12-09: 5 [IU] via SUBCUTANEOUS
  Administered 2019-12-09: 8 [IU] via SUBCUTANEOUS
  Administered 2019-12-09: 3 [IU] via SUBCUTANEOUS
  Administered 2019-12-10: 8 [IU] via SUBCUTANEOUS
  Administered 2019-12-10: 3 [IU] via SUBCUTANEOUS
  Administered 2019-12-10: 2 [IU] via SUBCUTANEOUS
  Administered 2019-12-11: 3 [IU] via SUBCUTANEOUS
  Administered 2019-12-11 (×2): 5 [IU] via SUBCUTANEOUS
  Administered 2019-12-12 (×2): 3 [IU] via SUBCUTANEOUS
  Administered 2019-12-12: 5 [IU] via SUBCUTANEOUS
  Administered 2019-12-13: 3 [IU] via SUBCUTANEOUS
  Administered 2019-12-13 – 2019-12-14 (×2): 5 [IU] via SUBCUTANEOUS

## 2019-12-07 MED ORDER — DONEPEZIL HCL 10 MG PO TABS
10.0000 mg | ORAL_TABLET | Freq: Every day | ORAL | Status: DC
  Administered 2019-12-07 – 2019-12-13 (×7): 10 mg via ORAL
  Filled 2019-12-07 (×7): qty 1

## 2019-12-07 MED ORDER — PANTOPRAZOLE SODIUM 40 MG PO TBEC
40.0000 mg | DELAYED_RELEASE_TABLET | Freq: Every day | ORAL | Status: DC
  Administered 2019-12-07 – 2019-12-14 (×8): 40 mg via ORAL
  Filled 2019-12-07 (×10): qty 1

## 2019-12-07 MED ORDER — CLOPIDOGREL BISULFATE 75 MG PO TABS
75.0000 mg | ORAL_TABLET | Freq: Every day | ORAL | Status: DC
  Administered 2019-12-07 – 2019-12-14 (×8): 75 mg via ORAL
  Filled 2019-12-07 (×8): qty 1

## 2019-12-07 MED ORDER — CALCITRIOL 0.25 MCG PO CAPS
0.2500 ug | ORAL_CAPSULE | Freq: Every morning | ORAL | Status: DC
  Administered 2019-12-07 – 2019-12-10 (×4): 0.25 ug via ORAL
  Filled 2019-12-07 (×4): qty 1

## 2019-12-07 MED ORDER — SODIUM CHLORIDE 0.9 % IV BOLUS
500.0000 mL | Freq: Once | INTRAVENOUS | Status: AC
  Administered 2019-12-07: 500 mL via INTRAVENOUS

## 2019-12-07 MED ORDER — DICLOFENAC SODIUM 1 % EX GEL
2.0000 g | Freq: Two times a day (BID) | CUTANEOUS | Status: DC | PRN
  Filled 2019-12-07: qty 100

## 2019-12-07 MED ORDER — ATORVASTATIN CALCIUM 80 MG PO TABS
80.0000 mg | ORAL_TABLET | Freq: Every morning | ORAL | Status: DC
  Administered 2019-12-07 – 2019-12-14 (×8): 80 mg via ORAL
  Filled 2019-12-07 (×8): qty 1

## 2019-12-07 MED ORDER — FERROUS SULFATE 325 (65 FE) MG PO TABS
325.0000 mg | ORAL_TABLET | Freq: Every day | ORAL | Status: DC
  Administered 2019-12-07 – 2019-12-14 (×8): 325 mg via ORAL
  Filled 2019-12-07 (×8): qty 1
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
## 2019-12-07 NOTE — Progress Notes (Signed)
Attempted echo.  Patient getting CT Scan

## 2019-12-07 NOTE — Progress Notes (Signed)
PROGRESS NOTE  [name] MWU:[id number] DOB: 1927-11-16 DOA: 12/06/2019 PCP: [name], MD  HPI/Recap of past 24 hours: HPI from Dr [name] is a 86 y.o. female with medical history significant for CAD, CVA on Plavix, heart block s/p pacemaker, hypertension, Type 2 diabetes, OSA on CPAP who presents for concerns of a fall. Patient was unable to recall much of her symptoms and there was no family at bedside. Patient states that she was walking with her cane and when she turned she fell onto the ground and hit her head.  Leg weakness.  She denies any dizziness or loss of consciousness.  Thinks she might have fell another time prior to this but could not recall the event. Per ED documentation, daughter brought patient and due to concerns of slurred speech and left-sided weakness. All symptoms resolved in the ED. In the ER, VSS. Labs showed creatinine elevated at 1.62 from a prior of 1.24.  CT head negative for any acute abnormalities. EDP discussed with neurology who recommends admission.    Today, patient denies any new complaints, still reports some generalized weakness.  Patient denies any chest pain, abdominal pain, nausea/vomiting, fever/chills.  No obvious focal neurologic deficits noted      Assessment/Plan: Principal Problem:   Weakness Active Problems:   Hypercholesterolemia   Hypertension   Type 2 diabetes mellitus (HCC)   Obstructive sleep apnea   Pacemaker  CVA rule out Presents with RLE weakness, slurred speech however daughter stated LLE weakness Initial CT head negative for any acute intracranial abnormality, repeat as per neurology recommendation on 12/08/2019 Unable to obtain MRI brain due to incompatible pacemaker Neurology on board Echo, carotid Doppler pending A1c, 8.2, LDL 119 Continue Plavix, Lipitor PT/OT/SLP Permissive hypertension Frequent neurochecks, telemetry  CKD stage IIIa Creatinine back to baseline Daily BMP  Chronic  normocytic anemia Hemoglobin at baseline Continue ferrous sulfate Daily CBC  Diabetes mellitus type 2 Uncontrolled A1c 8.2 SSI, Accu-Cheks, hypoglycemic protocol Hold home insulin regimen, Tradjenta  Hypertension Allow for permissive hypertension for today Restart BP meds from 12/08/2019 Hold valsartan, HCTZ, amlodipine, Coreg  CAD/hyperlipidemia Continue statins, Coreg  Heart block status post pacemaker Stable  OSA Continue CPAP  Mild cognitive impairment/?Dementia Continue Aricept          Malnutrition Type:      Malnutrition Characteristics:      Nutrition Interventions:       Estimated body mass index is 29.82 kg/m as calculated from the following:   Height as of 11/07/19: 5' 4"  (1.626 m).   Weight as of this encounter: 78.8 kg.     Code Status: DNR  Family Communication: None at bedside  Disposition Plan: Likely DC to home pending consultants signoff   Consultants:  Neurology  Procedures:  None  Antimicrobials:  None  DVT prophylaxis: Lovenox   Objective: Vitals:   12/07/19 0200 12/07/19 0401 12/07/19 0611 12/07/19 0756  BP: (!) 165/84 (!) 157/78 (!) 154/83 (!) 160/70  Pulse: 79 76 73 69  Resp: 16 19 18 18   Temp: 98.1 F (36.7 C) (!) 97.4 F (36.3 C) 97.6 F (36.4 C) 98 F (36.7 C)  TempSrc: Oral Oral Oral Oral  SpO2: 100% 100% 100% 98%  Weight: 78.8 kg       Intake/Output Summary (Last 24 hours) at 12/07/2019 1027 Last data filed at 12/07/2019 7253 Gross per 24 hour  Intake 660.76 ml  Output 250 ml  Net 410.76 ml   [employer]  12/07/19 0200  Weight: 78.8 kg    Exam:  General: NAD   Cardiovascular: S1, S2 present  Respiratory: CTAB  Abdomen: Soft, nontender, nondistended, bowel sounds present  Musculoskeletal: No bilateral pedal edema noted  Skin: Normal  Psychiatry: Normal mood  Neurology: RLE 4/5 motor strength, no obvious focal neurologic deficits    Data Reviewed: CBC: Recent Labs   Lab 12/06/19 1650 12/07/19 0748  WBC 6.2 6.8  NEUTROABS 3.3 2.8  HGB 11.0* 10.8*  HCT 34.9* 34.9*  MCV 91.6 93.1  PLT 237 469   Basic Metabolic Panel: Recent Labs  Lab 12/06/19 1650 12/07/19 0748  NA 139 141  K 4.1 3.9  CL 104 103  CO2 25 25  GLUCOSE 183* 152*  BUN 21 17  CREATININE 1.62* 1.24*  CALCIUM 11.1* 10.7*   GFR: Estimated Creatinine Clearance: 31.2 mL/min (A) (by C-G formula based on SCr of 1.24 mg/dL (H)). Liver Function Tests: Recent Labs  Lab 12/06/19 1650  AST 24  ALT 27  ALKPHOS 67  BILITOT 0.7  PROT 7.0  ALBUMIN 3.7   No results for input(s): LIPASE, AMYLASE in the last 168 hours. No results for input(s): AMMONIA in the last 168 hours. Coagulation Profile: Recent Labs  Lab 12/06/19 1650  INR 1.0   Cardiac Enzymes: No results for input(s): CKTOTAL, CKMB, CKMBINDEX, TROPONINI in the last 168 hours. BNP (last 3 results) No results for input(s): PROBNP in the last 8760 hours. HbA1C: Recent Labs    12/07/19 0209  HGBA1C 8.2*   CBG: Recent Labs  Lab 12/07/19 0133 12/07/19 0619 12/07/19 0831  GLUCAP 134* 142* 143*   Lipid Profile: Recent Labs    12/07/19 0209  CHOL 185  HDL 51  LDLCALC 119*  TRIG 74  CHOLHDL 3.6   Thyroid Function Tests: No results for input(s): TSH, T4TOTAL, FREET4, T3FREE, THYROIDAB in the last 72 hours. Anemia Panel: No results for input(s): VITAMINB12, FOLATE, FERRITIN, TIBC, IRON, RETICCTPCT in the last 72 hours. Urine analysis:    Component Value Date/Time   COLORURINE YELLOW 11/04/2019 [place] 11/04/2019 1533   LABSPEC 1.013 11/04/2019 1533   PHURINE 6.0 11/04/2019 1533   GLUCOSEU NEGATIVE 11/04/2019 1533   [place] 11/04/2019 [place] 11/04/2019 1533   KETONESUR NEGATIVE 11/04/2019 1533   PROTEINUR NEGATIVE 11/04/2019 1533   UROBILINOGEN 0.2 08/07/2015 1826   NITRITE NEGATIVE 11/04/2019 1533   LEUKOCYTESUR MODERATE (A) 11/04/2019 1533   Sepsis  Labs: @LABRCNTIP (procalcitonin:4,lacticidven:4)  ) Recent Results (from the past 240 hour(s))  SARS CORONAVIRUS 2 (TAT 6-24 HRS) Nasopharyngeal Nasopharyngeal Swab     Status: None   Collection Time: 12/06/19  9:56 PM   Specimen: Nasopharyngeal Swab  Result Value Ref Range Status   SARS Coronavirus 2 NEGATIVE NEGATIVE Final    Comment: (NOTE) SARS-CoV-2 target nucleic acids are NOT DETECTED. The SARS-CoV-2 RNA is generally detectable in upper and lower respiratory specimens during the acute phase of infection. Negative results do not preclude SARS-CoV-2 infection, do not rule out co-infections with other pathogens, and should not be used as the sole basis for treatment or other patient management decisions. Negative results must be combined with clinical observations, patient history, and epidemiological information. The expected result is Negative. Fact Sheet for Patients: SugarRoll.be Fact Sheet for Healthcare Providers: https://www.woods-mathews.com/ This test is not yet approved or cleared by the [place] FDA and  has been authorized for detection and/or diagnosis of SARS-CoV-2 by FDA under an Emergency Use  Authorization (EUA). This EUA will remain  in effect (meaning this test can be used) for the duration of the COVID-19 declaration under Section 56 4(b)(1) of the Act, 21 U.S.C. section 360bbb-3(b)(1), unless the authorization is terminated or revoked sooner. Performed at [facility], [place] [address] [place], [place] [address]       Studies: DG Chest 2 View  Result Date: 12/07/2019 CLINICAL DATA:  Frequent falls. EXAM: CHEST - 2 VIEW COMPARISON:  08/07/2015 FINDINGS: Moderate thoracic spondylosis. Pacer with leads at right atrium and right ventricle. Patient rotated right on the frontal. Normal heart size. No pleural effusion or pneumothorax. Clear lungs. IMPRESSION: No acute cardiopulmonary disease. Electronically  Signed   By: Abigail Miyamoto M.D.   On: 12/07/2019 09:40   CT HEAD WO CONTRAST  Result Date: 12/06/2019 CLINICAL DATA:  Possible stroke EXAM: CT HEAD WITHOUT CONTRAST TECHNIQUE: Contiguous axial images were obtained from the base of the skull through the vertex without intravenous contrast. COMPARISON:  CT head 11/07/2019 FINDINGS: Brain: Moderate atrophy. Mild chronic microvascular ischemic changes in the white matter. Negative for acute infarct, hemorrhage, mass.  No midline shift. Vascular: Negative for hyperdense vessel. Atherosclerotic calcification. Skull: Negative Sinuses/Orbits: Paranasal sinuses show mild mucosal edema. Bilateral ocular surgery Other: None IMPRESSION: Atrophy and chronic microvascular ischemia. No acute intracranial abnormality. Electronically Signed   By: Franchot Gallo M.D.   On: 12/06/2019 19:10   DG Hip Unilat W or Wo Pelvis 2-3 Views Right  Result Date: 12/06/2019 CLINICAL DATA:  86 year old female status post fall with right hip pain. EXAM: DG HIP (WITH OR WITHOUT PELVIS) 2-3V RIGHT COMPARISON:  Right hip series 12/15/2003. FINDINGS: Chronic fibroid uterus with dystrophic calcifications. Femoral heads are normally located. Hip joint spaces appear symmetric. The proximal right femur appears intact. The proximal left femur appear symmetric and grossly intact. No pelvic fracture is identified. Mild spurring at the pubic symphysis is chronic. Negative visible bowel gas pattern. Calcified femoral artery atherosclerosis. IMPRESSION: No acute fracture or dislocation identified about the right hip or pelvis. Electronically Signed   By: Genevie Ann M.D.   On: 12/06/2019 20:55    Scheduled Meds: . atorvastatin  80 mg Oral q morning - 10a  . calcitRIOL  0.25 mcg Oral q morning - 10a  . clopidogrel  75 mg Oral Daily  . donepezil  10 mg Oral QHS  . enoxaparin (LOVENOX) injection  30 mg Subcutaneous Daily  . ferrous sulfate  325 mg Oral Q breakfast  . insulin aspart  0-15 Units  Subcutaneous TID WC  . pantoprazole  40 mg Oral QAC breakfast  . sodium chloride flush  3 mL Intravenous Once    Continuous Infusions:   LOS: 1 day     Alma Friendly, MD [facility]  If 7PM-7AM, please contact night-coverage [email or web address] 12/07/2019, 10:27 AM

## 2019-12-07 NOTE — Progress Notes (Signed)
Rehab Admissions Coordinator Note:  Per PT/OT recommendation, patient was screened by Michel Santee for appropriateness for an Inpatient Acute Rehab Consult.  At this time, we are recommending CIR consult.  I will place the order per protocol.   Michel Santee 12/07/2019, 3:39 PM  I can be reached at [phone].

## 2019-12-07 NOTE — Progress Notes (Signed)
  Echocardiogram 2D Echocardiogram has been performed.  [name] 12/07/2019, 3:50 PM

## 2019-12-07 NOTE — Consult Note (Signed)
Referring Physician: Dr. Sabra Heck    Chief Complaint: Slurred speech with left sided weakness.   HPI: [name] is an 86 y.o. female who presented from home via EMS yesterday afternoon with an episode of slurred speech and left sided weakness, beginning at 1530. This is in the context of a couple of falls in the prior 24 hours. The first occurred while trying to get out of a car. The second occurred on the day of admission - she was found on the ground in a hallway by a family member and reportedly did not want to get up, complaining of her RLE being weak and painful. All symptoms and signs had resolved by the time of arrival to the ED. The patient endorsed a different side from the weakness felt by daughter to be on the left - patient endorsed only RLE weakness. She was fully alert and oriented at time of initial assessment by ED staff with no neurological deficits.   She was unable to have an MRI scan due to pacemaker that is not MRI compatible per the MRI techs.   Her PMHx includes CHF, DM, CAD, hypercholesterolemia, HTN, sleep apnea, prior stroke and memory dysfunction due to a concussion a few years ago.    Home medications include Plavix.   At baseline she walks with a cane.   CT reveals Atrophy and chronic microvascular ischemia. No acute intracranial abnormality seen.    Past Medical History:  Diagnosis Date  . Anemia   . Arthritis   . Celiac disease   . Chronic systolic CHF (congestive heart failure) ([place])   . Complete heart block ([place])   . Coronary artery disease   . Diabetes mellitus    INSULIN DEPENDENT  . GERD (gastroesophageal reflux disease)   . Glaucoma   . Headache(784.0)   . Heart disease   . Hypercholesterolemia   . Hypertension   . Iron deficiency anemia, unspecified   . Ischemic cardiomyopathy   . Memory difficulties 04/14/2019  . Shortness of breath   . Sleep apnea    uses cpap  . Stroke ([place])   . Unspecified constipation     Past Surgical  History:  Procedure Laterality Date  . BACK SURGERY    . BI-VENTRICULAR PACEMAKER INSERTION (CRT-P)  March 2014   [facility]  . BIV PACEMAKER GENERATOR CHANGEOUT N/A 06/29/2019   Procedure: BIV PACEMAKER GENERATOR CHANGEOUT;  Surgeon: Evans Lance, MD;  Location: [facility];  Service: Cardiovascular;  Laterality: N/A;  . CARDIAC CATHETERIZATION  09/02/2012  . CARDIAC SURGERY    . CORONARY ANGIOPLASTY WITH STENT PLACEMENT  09/02/2012   RCA  . PERCUTANEOUS CORONARY STENT INTERVENTION (PCI-S) N/A 09/02/2012   Procedure: PERCUTANEOUS CORONARY STENT INTERVENTION (PCI-S);  Surgeon: Clent Demark, MD;  Location: [facility] CATH LAB;  Service: Cardiovascular;  Laterality: N/A;  . PERMANENT PACEMAKER INSERTION N/A 11/29/2012   Procedure: PERMANENT PACEMAKER INSERTION;  Surgeon: Deboraha Sprang, MD;  Location: [facility] CATH LAB;  Service: Cardiovascular;  Laterality: N/A;  . TEMPORARY PACEMAKER INSERTION N/A 11/28/2012   Procedure: TEMPORARY PACEMAKER INSERTION;  Surgeon: Clent Demark, MD;  Location: [facility] CATH LAB;  Service: Cardiovascular;  Laterality: N/A;    Family History  Problem Relation Age of Onset  . Diabetes Mother   . Hypertension Mother   . Hyperlipidemia Mother   . Cancer Sister   . Dementia Sister   . Neuropathy Sister    Social History:  reports that she quit smoking about  42 years ago. She has never used smokeless tobacco. She reports that she does not drink alcohol or use drugs.  Allergies: No Known Allergies  Home Medications:  No current facility-administered medications on file prior to encounter.   Current Outpatient Medications on File Prior to Encounter  Medication Sig Dispense Refill  . amLODipine (NORVASC) 2.5 MG tablet Take 2.5 mg by mouth every morning.    Marland Kitchen atorvastatin (LIPITOR) 80 MG tablet Take 80 mg by mouth every morning.   5  . brimonidine-timolol (COMBIGAN) 0.2-0.5 % ophthalmic solution Place 1 drop into both eyes 2 (two) times daily.    . calcitRIOL  (ROCALTROL) 0.25 MCG capsule Take 0.25 mcg by mouth every morning.    . carvedilol (COREG) 3.125 MG tablet Take 3.125 mg by mouth 2 (two) times daily.  3  . clopidogrel (PLAVIX) 75 MG tablet Take 1 tablet (75 mg total) by mouth daily. 30 tablet 0  . Cod Liver Oil CAPS Take 1 capsule by mouth at bedtime.     . donepezil (ARICEPT) 10 MG tablet Take 10 mg by mouth at bedtime.     . dorzolamide (TRUSOPT) 2 % ophthalmic solution Place 1 drop into both eyes 2 (two) times daily.     . ferrous sulfate 325 (65 FE) MG tablet Take 325 mg by mouth daily with breakfast.    . insulin degludec (TRESIBA FLEXTOUCH) 100 UNIT/ML SOPN FlexTouch Pen Inject 10-20 Units into the skin See admin instructions. Inject 10-20 units into the skin at bedtime, based upon CGB (home capillary blood glucose)    . Insulin Lispro (HUMALOG KWIKPEN) 200 UNIT/ML SOPN Inject 6 Units into the skin 3 (three) times daily as needed (high blood sugar).     Marland Kitchen latanoprost (XALATAN) 0.005 % ophthalmic solution Appointment OVERDUE, place 1 drop into both eyes daily at bedtime (Patient taking differently: Place 1 drop into both eyes at bedtime. ) 2.5 mL 0  . linagliptin (TRADJENTA) 5 MG TABS tablet Take 5 mg by mouth every morning.    . loperamide (IMODIUM) 2 MG capsule Take 2 mg by mouth as needed for diarrhea or loose stools.    . Multiple Vitamin (MULTIVITAMIN WITH MINERALS) TABS Take 1 tablet by mouth at bedtime.     . Multiple Vitamins-Minerals (EYE VITAMINS) CAPS Take 1 capsule by mouth every morning.    . nitroGLYCERIN (NITROSTAT) 0.4 MG SL tablet Place 0.4 mg under the tongue every 5 (five) minutes as needed for chest pain.     . pantoprazole (PROTONIX) 40 MG tablet Take 40 mg by mouth daily before breakfast.     . valsartan-hydrochlorothiazide (DIOVAN-HCT) 320-12.5 MG tablet Take 1 tablet by mouth every morning.     Marland Kitchen NOVOFINE 32G X 6 MM MISC        ROS: No hip pain, no chest pain, no SOB, no coughing, no N/V.   Physical  Examination: Blood pressure (!) 157/78, pulse 76, temperature (!) 97.4 F (36.3 C), temperature source Oral, resp. rate 19, weight 78.8 kg, SpO2 100 %.  HEENT: [place]/AT Lungs: Respirations unlabored Ext: No edema  Neurologic Examination: Mental Status:  Alert, oriented to month, city and state but not day or year. Pleasant and cooperative. Speech fluent with intact comprehension of basic questions and commands. Has some difficulty naming some objects. No dysarthria.  Cranial Nerves: II:  Tracks and fixates normally. PERRL III,IV, VI: No ptosis. EOMI.  V,VII: Smile symmetric, facial temp sensation equal bilaterally VIII: hearing intact to voice IX,X: No  hypophonia XI: Symmetric XII: midline tongue extension  Motor: BUE 5/5 without asymmetry LLE 5/5 RLE 5/5 APF/ADF but with 4/5 knee flexion and extension. Also with some hesitancy vs weakness to flexion at right hip.  Sensory: Temp and light touch intact throughout, bilaterally. No extinction.  Deep Tendon Reflexes:  1+ bilateral brachioradialis and biceps. Trace patellar reflexes.  Cerebellar: No ataxia with FNF bilaterally  Gait: Deferred  Results for orders placed or performed during the hospital encounter of 12/06/19 (from the past 48 hour(s))  Protime-INR     Status: None   Collection Time: 12/06/19  4:50 PM  Result Value Ref Range   Prothrombin Time 13.3 11.4 - 15.2 seconds   INR 1.0 0.8 - 1.2    Comment: (NOTE) INR goal varies based on device and disease states. Performed at [facility], [place] [address] [place], [place] [address]   APTT     Status: None   Collection Time: 12/06/19  4:50 PM  Result Value Ref Range   aPTT 32 24 - 36 seconds    Comment: Performed at [facility] [address], [place], [place] [address]  CBC     Status: Abnormal   Collection Time: 12/06/19  4:50 PM  Result Value Ref Range   WBC 6.2 4.0 - 10.5 K/uL   RBC 3.81 (L) 3.87 - 5.11 MIL/uL   Hemoglobin 11.0 (L) 12.0 - 15.0 g/dL    HCT 34.9 (L) 36.0 - 46.0 %   MCV 91.6 80.0 - 100.0 fL   MCH 28.9 26.0 - 34.0 pg   MCHC 31.5 30.0 - 36.0 g/dL   RDW 12.5 11.5 - 15.5 %   Platelets 237 150 - 400 K/uL   nRBC 0.0 0.0 - 0.2 %    Comment: Performed at [facility], [place] [address], [place], [place] [address]  Differential     Status: None   Collection Time: 12/06/19  4:50 PM  Result Value Ref Range   Neutrophils Relative % 53 %   Neutro Abs 3.3 1.7 - 7.7 K/uL   Lymphocytes Relative 36 %   Lymphs Abs 2.2 0.7 - 4.0 K/uL   Monocytes Relative 9 %   Monocytes Absolute 0.5 0.1 - 1.0 K/uL   Eosinophils Relative 1 %   Eosinophils Absolute 0.0 0.0 - 0.5 K/uL   Basophils Relative 1 %   Basophils Absolute 0.0 0.0 - 0.1 K/uL   Immature Granulocytes 0 %   Abs Immature Granulocytes 0.01 0.00 - 0.07 K/uL    Comment: Performed at [facility] [address], [place], [place] [address]  Comprehensive metabolic panel     Status: Abnormal   Collection Time: 12/06/19  4:50 PM  Result Value Ref Range   Sodium 139 135 - 145 mmol/L   Potassium 4.1 3.5 - 5.1 mmol/L   Chloride 104 98 - 111 mmol/L   CO2 25 22 - 32 mmol/L   Glucose, Bld 183 (H) 70 - 99 mg/dL   BUN 21 8 - 23 mg/dL   Creatinine, Ser 1.62 (H) 0.44 - 1.00 mg/dL   Calcium 11.1 (H) 8.9 - 10.3 mg/dL   Total Protein 7.0 6.5 - 8.1 g/dL   Albumin 3.7 3.5 - 5.0 g/dL   AST 24 15 - 41 U/L   ALT 27 0 - 44 U/L   Alkaline Phosphatase 67 38 - 126 U/L   Total Bilirubin 0.7 0.3 - 1.2 mg/dL   GFR calc non Af  Amer 28 (L) >60 mL/min   GFR calc Af Amer 32 (L) >60 mL/min   Anion gap 10 5 - 15    Comment: Performed at [facility] [address] [place], [place] [address]  I-Stat beta hCG blood, ED ([facility], [facility], [facility] only)     Status: None   Collection Time: 12/06/19  5:16 PM  Result Value Ref Range   I-stat hCG, quantitative <5.0 <5 mIU/mL   Comment 3            Comment:   GEST. AGE      CONC.  (mIU/mL)   <=1 WEEK        5 - 50     2 WEEKS       50 - 500     3 WEEKS        100 - 10,000     4 WEEKS     1,000 - 30,000        FEMALE AND NON-PREGNANT FEMALE:     LESS THAN 5 mIU/mL   SARS CORONAVIRUS 2 (TAT 6-24 HRS) Nasopharyngeal Nasopharyngeal Swab     Status: None   Collection Time: 12/06/19  9:56 PM   Specimen: Nasopharyngeal Swab  Result Value Ref Range   SARS Coronavirus 2 NEGATIVE NEGATIVE    Comment: (NOTE) SARS-CoV-2 target nucleic acids are NOT DETECTED. The SARS-CoV-2 RNA is generally detectable in upper and lower respiratory specimens during the acute phase of infection. Negative results do not preclude SARS-CoV-2 infection, do not rule out co-infections with other pathogens, and should not be used as the sole basis for treatment or other patient management decisions. Negative results must be combined with clinical observations, patient history, and epidemiological information. The expected result is Negative. Fact Sheet for Patients: SugarRoll.be Fact Sheet for Healthcare Providers: https://www.woods-mathews.com/ This test is not yet approved or cleared by the [place] FDA and  has been authorized for detection and/or diagnosis of SARS-CoV-2 by FDA under an Emergency Use Authorization (EUA). This EUA will remain  in effect (meaning this test can be used) for the duration of the COVID-19 declaration under Section 56 4(b)(1) of the Act, 21 U.S.C. section 360bbb-3(b)(1), unless the authorization is terminated or revoked sooner. Performed at [facility], [place] [address], [place], [place] [address]   CBG monitoring, ED     Status: Abnormal   Collection Time: 12/07/19  1:33 AM  Result Value Ref Range   Glucose-Capillary 134 (H) 70 - 99 mg/dL  Hemoglobin A1c     Status: Abnormal   Collection Time: 12/07/19  2:09 AM  Result Value Ref Range   Hgb A1c MFr Bld 8.2 (H) 4.8 - 5.6 %    Comment: (NOTE) Pre diabetes:          5.7%-6.4% Diabetes:              >6.4% Glycemic control for    <7.0% adults with diabetes    Mean Plasma Glucose 188.64 mg/dL    Comment: Performed at [facility] [address], [place], [place] [address]  Lipid panel     Status: Abnormal   Collection Time: 12/07/19  2:09 AM  Result Value Ref Range   Cholesterol 185 0 - 200 mg/dL   Triglycerides 74 <150 mg/dL   HDL 51 >40 mg/dL   Total CHOL/HDL Ratio 3.6 RATIO   VLDL 15 0 - 40 mg/dL   LDL Cholesterol 119 (H) 0 - 99 mg/dL  Comment:        Total Cholesterol/HDL:CHD Risk Coronary Heart Disease Risk Table                     Men   Women  1/2 Average Risk   3.4   3.3  Average Risk       5.0   4.4  2 X Average Risk   9.6   7.1  3 X Average Risk  23.4   11.0        Use the calculated Patient Ratio above and the CHD Risk Table to determine the patient's CHD Risk.        ATP III CLASSIFICATION (LDL):  <100     mg/dL   Optimal  100-129  mg/dL   Near or Above                    Optimal  130-159  mg/dL   Borderline  160-189  mg/dL   High  >190     mg/dL   Very High Performed at [facility] [address], [place], [place] [address]    CT HEAD WO CONTRAST  Result Date: 12/06/2019 CLINICAL DATA:  Possible stroke EXAM: CT HEAD WITHOUT CONTRAST TECHNIQUE: Contiguous axial images were obtained from the base of the skull through the vertex without intravenous contrast. COMPARISON:  CT head 11/07/2019 FINDINGS: Brain: Moderate atrophy. Mild chronic microvascular ischemic changes in the white matter. Negative for acute infarct, hemorrhage, mass.  No midline shift. Vascular: Negative for hyperdense vessel. Atherosclerotic calcification. Skull: Negative Sinuses/Orbits: Paranasal sinuses show mild mucosal edema. Bilateral ocular surgery Other: None IMPRESSION: Atrophy and chronic microvascular ischemia. No acute intracranial abnormality. Electronically Signed   By: Franchot Gallo M.D.   On: 12/06/2019 19:10   DG Hip Unilat W or Wo Pelvis 2-3 Views Right  Result Date: 12/06/2019 CLINICAL  DATA:  86 year old female status post fall with right hip pain. EXAM: DG HIP (WITH OR WITHOUT PELVIS) 2-3V RIGHT COMPARISON:  Right hip series 12/15/2003. FINDINGS: Chronic fibroid uterus with dystrophic calcifications. Femoral heads are normally located. Hip joint spaces appear symmetric. The proximal right femur appears intact. The proximal left femur appear symmetric and grossly intact. No pelvic fracture is identified. Mild spurring at the pubic symphysis is chronic. Negative visible bowel gas pattern. Calcified femoral artery atherosclerosis. IMPRESSION: No acute fracture or dislocation identified about the right hip or pelvis. Electronically Signed   By: Genevie Ann M.D.   On: 12/06/2019 20:55    Assessment: 86 y.o. female presenting with falls and lower extremity weakness. Unclear by history if initial weakness prior to presentation was right or left, but exam shows mild RLE weakness.  1. CT head without acute abnormality. Chronic microvascular ischemia is noted. 2. DDx includes ACA stroke versus falls secondary to other etiologies 3. Stroke Risk Factors - CHF, DM, CAD, hypercholesterolemia, HTN, sleep apnea, prior stroke 4. Unable to obtain MRI brain due to pacemaker, which per MRI tech is not MRI compatible.  5. Low eGFR  Recommendations: 1. HgbA1c, fasting lipid panel 2. Repeat CT head in 48 hours to assess for possible new stroke not yet visible on CT obtained in the ED 3. PT consult, OT consult, Speech consult 4. Echocardiogram 5. Carotid dopplers 6. Prophylactic therapy- Continue Plavix and atorvastatin 7. Risk factor modification 8. Telemetry monitoring 9. Frequent neuro checks 10. BP management  @Electronically  signed: Dr. Kerney Elbe  12/07/2019, 4:21 AM

## 2019-12-07 NOTE — Evaluation (Signed)
Occupational Therapy Evaluation Patient Details Name: [name] MRN: [id number] DOB: 30-Jan-1928 Today's Date: 12/07/2019    History of Present Illness Pt is a 86 y.o. F with significant PMH of CHF, DM, CAD with pacemaker, HTN, prior stroke, who presents with several falls in the prior 24 hours and RLE weakness. CT negative for acute abnormality. Pt unable to receive MRI due to pacemaker.    Clinical Impression   PTA pt reports living with niece whom works from home and assists, as well as assist from aides M-F during the day. At time of eval, pt presents with generalized weakness, increased fall risk, R knee buckle and cognitive deficits. Pt compelted bed mobiltiy at min A. Intermittent assist needed for sitting balance and cues for safety. Pt completed sit <> stand with min A +2 and multimodal cues to navigate RW. Pt able to tolerate short distance to sink, then needing chair pulled up behind her emergently due to knee buckling. Pt is unaware of this. Given her current status, recommend CIR to facilitate previous independence and mobility, while minimizing fall risk. Will continue to follow per POC listed below.     Follow Up Recommendations  CIR;Supervision/Assistance - 24 hour    Equipment Recommendations  None recommended by OT    Recommendations for Other Services Rehab consult     Precautions / Restrictions Precautions Precautions: Fall;Other (comment) Precaution Comments: R knee buckle Restrictions Weight Bearing Restrictions: No      Mobility Bed Mobility Overal bed mobility: Needs Assistance Bed Mobility: Supine to Sit     Supine to sit: Min assist     General bed mobility comments: min A to EOB to facilitate upright position and cues to scoot EOB for increased sitting balance and stability  Transfers Overall transfer level: Needs assistance Equipment used: Rolling walker (2 wheeled) Transfers: Sit to/from Stand Sit to Stand: Min assist;+2 physical  assistance;+2 safety/equipment         General transfer comment: min A +2 for safety with RW and hand placement, pt with R knee buckle    Balance Overall balance assessment: Needs assistance Sitting-balance support: Feet supported Sitting balance-Leahy Scale: Fair Sitting balance - Comments: cannot tolerate challenge   Standing balance support: Bilateral upper extremity supported;During functional activity Standing balance-Leahy Scale: Poor Standing balance comment: reliant on external support and therapist assist                           ADL either performed or assessed with clinical judgement   ADL Overall ADL's : Needs assistance/impaired Eating/Feeding: Set up;Sitting   Grooming: Set up;Sitting Grooming Details (indicate cue type and reason): attempted to stand, pt could not sustain with R knee buckle and needing to sit Upper Body Bathing: Minimal assistance;Sitting   Lower Body Bathing: Minimal assistance;Sit to/from stand;Sitting/lateral leans   Upper Body Dressing : Minimal assistance;Sitting   Lower Body Dressing: Minimal assistance;Sit to/from stand;Sitting/lateral leans Lower Body Dressing Details (indicate cue type and reason): increased time and effort to don socks, min support to keep legs crossed when donning B socks Toilet Transfer: Minimal assistance;+2 for physical assistance;+2 for safety/equipment;RW Toilet Transfer Details (indicate cue type and reason): for safety, R knee buckle Toileting- Clothing Manipulation and Hygiene: Minimal assistance;Sit to/from stand       Functional mobility during ADLs: Minimal assistance;+2 for physical assistance;+2 for safety/equipment;Rolling walker General ADL Comments: pt limited by generalized weakness, increased fall risk, R knee buckle, and cognitive deficits for  engagement in BADL     Vision Baseline Vision/History: No visual deficits       Perception     Praxis      Pertinent Vitals/Pain Pain  Assessment: No/denies pain     Hand Dominance Right   Extremity/Trunk Assessment Upper Extremity Assessment Upper Extremity Assessment: Generalized weakness   Lower Extremity Assessment Lower Extremity Assessment: Defer to PT evaluation       Communication Communication Communication: HOH   Cognition Arousal/Alertness: Awake/alert Behavior During Therapy: WFL for tasks assessed/performed Overall Cognitive Status: Impaired/Different from baseline Area of Impairment: Memory;Safety/judgement;Awareness;Problem solving                     Memory: Decreased short-term memory   Safety/Judgement: Decreased awareness of safety;Decreased awareness of deficits Awareness: Emergent Problem Solving: Requires verbal cues General Comments: pt requiring cues and support for multistep tasks for safety   General Comments       Exercises     Shoulder Instructions      Home Living Family/patient expects to be discharged to:: Private residence Living Arrangements: Other (Comment)(niece; dtr next door) Available Help at Discharge: Family;Available 24 hours/day(niece works from home) Type of Home: House Home Access: Level entry     [place]: One level     Bathroom Shower/Tub: Teacher, early years/pre: [place] - single point;Bedside commode;Shower seat          Prior Functioning/Environment Level of Independence: Needs assistance  Gait / Transfers Assistance Needed: pt reports use of SPC for mobility     Comments: aide M-F 8 hours a day (? pt difficulty reporting time) helps with BADL        OT Problem List: Decreased strength;Decreased knowledge of use of DME or AE;Decreased knowledge of precautions;Decreased activity tolerance;Decreased cognition;Impaired balance (sitting and/or standing);Decreased safety awareness;Pain      OT Treatment/Interventions: Self-care/ADL training;Therapeutic exercise;Patient/family  education;Balance training;Energy conservation;Therapeutic activities;DME and/or AE instruction;Cognitive remediation/compensation    OT Goals(Current goals can be found in the care plan section) Acute Rehab OT Goals Patient Stated Goal: be able to take care of self again OT Goal Formulation: With patient Time For Goal Achievement: 12/21/19 Potential to Achieve Goals: Good  OT Frequency: Min 2X/week   Barriers to D/C:            Co-evaluation PT/OT/SLP Co-Evaluation/Treatment: Yes Reason for Co-Treatment: For patient/therapist safety;To address functional/ADL transfers PT goals addressed during session: Mobility/safety with mobility OT goals addressed during session: ADL's and self-care;Proper use of Adaptive equipment and DME      AM-PAC OT "6 Clicks" Daily Activity     Outcome Measure Help from another person eating meals?: None Help from another person taking care of personal grooming?: A Little Help from another person toileting, which includes using toliet, bedpan, or urinal?: A Lot Help from another person bathing (including washing, rinsing, drying)?: A Lot Help from another person to put on and taking off regular upper body clothing?: A Little Help from another person to put on and taking off regular lower body clothing?: A Lot 6 Click Score: 16   End of Session Equipment Utilized During Treatment: Gait belt;Rolling walker Nurse Communication: Mobility status  Activity Tolerance: Patient tolerated treatment well Patient left: in chair;with call bell/phone within reach;with chair alarm set  OT Visit Diagnosis: Unsteadiness on feet (R26.81);Other abnormalities of gait and mobility (R26.89);Muscle weakness (generalized) (M62.81);Other symptoms and signs involving cognitive function  Time: 2370-2301 OT Time Calculation (min): 33 min Charges:  OT General Charges $OT Visit: 1 Visit OT Evaluation $OT Eval Moderate Complexity: 1 Mod  Zenovia Jarred, MSOT,  OTR/L Acute Rehabilitation Services [facility] Office Number: [phone]  Zenovia Jarred 12/07/2019, 12:58 PM

## 2019-12-07 NOTE — Progress Notes (Signed)
I concur with the assessmennt and med administration facilitated by Shanice Tabron SN [facility].

## 2019-12-07 NOTE — Evaluation (Signed)
Physical Therapy Evaluation Patient Details Name: [name] MRN: [id number] DOB: [birth year] Today's Date: 12/07/2019   History of Present Illness  Pt is a 86 y.o. F with significant PMH of CHF, DM, CAD with pacemaker, HTN, prior stroke, who presents with several falls in the prior 24 hours and RLE weakness. CT negative for acute abnormality. Pt unable to receive MRI due to pacemaker.   Clinical Impression  Prior to admission, pt lives with her granddaughter, ambulates with a cane, and requires assist for some ADL's. On PT evaluation, pt presents with functional RLE weakness, balance impairments, cognitive deficits. Requiring two person moderate assist ambulating ~8 feet with a walker, pt with right knee buckle requiring chair being pulled up behind her at sink. Presents as high fall risk secondary to deficits listed above, history of falls, and decreased safety awareness. Recommending CIR to address and maximize functional mobility.     Follow Up Recommendations CIR    Equipment Recommendations  Wheelchair (measurements PT)    Recommendations for Other Services Rehab consult     Precautions / Restrictions Precautions Precautions: Fall;Other (comment) Precaution Comments: R knee buckle Restrictions Weight Bearing Restrictions: No      Mobility  Bed Mobility Overal bed mobility: Needs Assistance Bed Mobility: Supine to Sit     Supine to sit: Min assist     General bed mobility comments: min A to EOB to facilitate upright position and cues to scoot EOB for increased sitting balance and stability  Transfers Overall transfer level: Needs assistance Equipment used: Rolling walker (2 wheeled) Transfers: Sit to/from Stand Sit to Stand: Min assist;+2 physical assistance;+2 safety/equipment         General transfer comment: min A +2 for safety with RW and hand placement, pt with R knee buckle  Ambulation/Gait Ambulation/Gait assistance: Mod assist;+2 physical  assistance Gait Distance (Feet): 8 Feet Assistive device: Rolling walker (2 wheeled) Gait Pattern/deviations: Step-through pattern;Decreased stride length;Trunk flexed Gait velocity: decreased   General Gait Details: Pt with increased trunk flexion, cues provided for upright posture, stepping initiation. R knee buckle with fatigue, necessitating chair to be pulled up behind her.  Stairs            Wheelchair Mobility    Modified Rankin (Stroke Patients Only) Modified Rankin (Stroke Patients Only) Pre-Morbid Rankin Score: Moderate disability Modified Rankin: Moderately severe disability     Balance Overall balance assessment: Needs assistance Sitting-balance support: Feet supported Sitting balance-Leahy Scale: Fair Sitting balance - Comments: cannot tolerate challenge   Standing balance support: Bilateral upper extremity supported;During functional activity Standing balance-Leahy Scale: Poor Standing balance comment: reliant on external support and therapist assist                             Pertinent Vitals/Pain Pain Assessment: No/denies pain    Home Living Family/patient expects to be discharged to:: Private residence Living Arrangements: Other (Comment)(granddaughter, daughter next door) Available Help at Discharge: Family Type of Home: House Home Access: Level entry     Home Layout: One level Home Equipment: Cane - single point;Bedside commode;Shower seat      Prior Function Level of Independence: Needs assistance   Gait / Transfers Assistance Needed: pt reports use of SPC for mobility  ADL's / Homemaking Assistance Needed: pt's daughter assists with bathing        Hand Dominance   Dominant Hand: Right    Extremity/Trunk Assessment   Upper Extremity Assessment Upper Extremity  Assessment: Defer to OT evaluation    Lower Extremity Assessment Lower Extremity Assessment: RLE deficits/detail;LLE deficits/detail RLE Deficits / Details:  Strength 5/5 LLE Deficits / Details: Strength 5/5       Communication   Communication: HOH  Cognition Arousal/Alertness: Awake/alert Behavior During Therapy: WFL for tasks assessed/performed Overall Cognitive Status: History of cognitive impairments - at baseline Area of Impairment: Memory;Safety/judgement;Awareness;Problem solving                     Memory: Decreased short-term memory   Safety/Judgement: Decreased awareness of safety;Decreased awareness of deficits Awareness: Emergent Problem Solving: Requires verbal cues General Comments: pt requiring cues and support for multistep tasks for safety      General Comments      Exercises     Assessment/Plan    PT Assessment Patient needs continued PT services  PT Problem List Decreased strength;Decreased activity tolerance;Decreased balance;Decreased mobility;Decreased cognition       PT Treatment Interventions DME instruction;Gait training;Functional mobility training;Therapeutic activities;Therapeutic exercise;Balance training;Patient/family education    PT Goals (Current goals can be found in the Care Plan section)  Acute Rehab PT Goals Patient Stated Goal: be able to take care of self again PT Goal Formulation: With patient Time For Goal Achievement: 12/21/19 Potential to Achieve Goals: Good    Frequency Min 3X/week   Barriers to discharge        Co-evaluation PT/OT/SLP Co-Evaluation/Treatment: Yes Reason for Co-Treatment: For patient/therapist safety;To address functional/ADL transfers PT goals addressed during session: Mobility/safety with mobility OT goals addressed during session: ADL's and self-care;Proper use of Adaptive equipment and DME       AM-PAC PT "6 Clicks" Mobility  Outcome Measure Help needed turning from your back to your side while in a flat bed without using bedrails?: None Help needed moving from lying on your back to sitting on the side of a flat bed without using bedrails?:  A Little Help needed moving to and from a bed to a chair (including a wheelchair)?: A Little Help needed standing up from a chair using your arms (e.g., wheelchair or bedside chair)?: A Little Help needed to walk in hospital room?: A Lot Help needed climbing 3-5 steps with a railing? : A Lot 6 Click Score: 17    End of Session Equipment Utilized During Treatment: Gait belt Activity Tolerance: Patient tolerated treatment well Patient left: in chair;with call bell/phone within reach;with chair alarm set Nurse Communication: Mobility status PT Visit Diagnosis: Unsteadiness on feet (R26.81);History of falling (Z91.81);Difficulty in walking, not elsewhere classified (R26.2)    Time: 1030-1105 PT Time Calculation (min) (ACUTE ONLY): 35 min   Charges:   PT Evaluation $PT Eval Moderate Complexity: 1 Mod            Wyona Almas, PT, DPT Acute Rehabilitation Services Pager [phone] Office [phone]   Deno Etienne 12/07/2019, 3:07 PM
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
## 2019-12-07 NOTE — Progress Notes (Signed)
Inpatient Rehabilitation Admissions Coordinator  Inpatient rehab consul received. I met with patient at bedside for rehab assessment. Noted for repeat CT scan with possible TIA vs CVA. Patient states lives at home with granddaughter to assist. States her legs just gave way. I will follow her medical workup and therapy progress to assist with planning dispo as appropriate.  Danne Baxter, RN, MSN Rehab Admissions Coordinator [phone] 12/07/2019 6:11 PM

## 2019-12-07 NOTE — Progress Notes (Signed)
STROKE TEAM PROGRESS NOTE   INTERVAL HISTORY Her therapists and student RN are at the bedside.  Patient is a poor historian and she states that she fell down because the right leg gave out.  The daughter apparently described left leg weakness.  On exam today she has some giveaway weakness in the right leg but also complains of knee pain.  CT scan of the head on admission showed no acute abnormality.  Follow-up CT scan is pending.  She cannot have an MRI because of pacemaker Vitals:   12/07/19 0200 12/07/19 0401 12/07/19 0611 12/07/19 0756  BP: (!) 165/84 (!) 157/78 (!) 154/83 (!) 160/70  Pulse: 79 76 73 69  Resp: 16 19 18 18   Temp: 98.1 F (36.7 C) (!) 97.4 F (36.3 C) 97.6 F (36.4 C) 98 F (36.7 C)  TempSrc: Oral Oral Oral Oral  SpO2: 100% 100% 100% 98%  Weight: 78.8 kg       CBC:  Recent Labs  Lab 12/06/19 1650 12/07/19 0748  WBC 6.2 6.8  NEUTROABS 3.3 2.8  HGB 11.0* 10.8*  HCT 34.9* 34.9*  MCV 91.6 93.1  PLT 237 601    Basic Metabolic Panel:  Recent Labs  Lab 12/06/19 1650 12/07/19 0748  NA 139 141  K 4.1 3.9  CL 104 103  CO2 25 25  GLUCOSE 183* 152*  BUN 21 17  CREATININE 1.62* 1.24*  CALCIUM 11.1* 10.7*   Lipid Panel:     Component Value Date/Time   CHOL 185 12/07/2019 0209   CHOL 194 03/28/2013 1021   TRIG 74 12/07/2019 0209   HDL 51 12/07/2019 0209   HDL 44 03/28/2013 1021   CHOLHDL 3.6 12/07/2019 0209   VLDL 15 12/07/2019 0209   LDLCALC 119 (H) 12/07/2019 0209   LDLCALC 125 (H) 03/28/2013 1021   HgbA1c:  Lab Results  Component Value Date   HGBA1C 8.2 (H) 12/07/2019   Urine Drug Screen:     Component Value Date/Time   LABOPIA NONE DETECTED 10/09/2019 2052   COCAINSCRNUR NONE DETECTED 10/09/2019 2052   LABBENZ NONE DETECTED 10/09/2019 2052   AMPHETMU NONE DETECTED 10/09/2019 2052   THCU NONE DETECTED 10/09/2019 2052   LABBARB NONE DETECTED 10/09/2019 2052    Alcohol Level     Component Value Date/Time   ETH <10 10/09/2019 1719     IMAGING past 48 hours DG Chest 2 View  Result Date: 12/07/2019 CLINICAL DATA:  Frequent falls. EXAM: CHEST - 2 VIEW COMPARISON:  08/07/2015 FINDINGS: Moderate thoracic spondylosis. Pacer with leads at right atrium and right ventricle. Patient rotated right on the frontal. Normal heart size. No pleural effusion or pneumothorax. Clear lungs. IMPRESSION: No acute cardiopulmonary disease. Electronically Signed   By: Abigail Miyamoto M.D.   On: 12/07/2019 09:40   CT HEAD WO CONTRAST  Result Date: 12/06/2019 CLINICAL DATA:  Possible stroke EXAM: CT HEAD WITHOUT CONTRAST TECHNIQUE: Contiguous axial images were obtained from the base of the skull through the vertex without intravenous contrast. COMPARISON:  CT head 11/07/2019 FINDINGS: Brain: Moderate atrophy. Mild chronic microvascular ischemic changes in the white matter. Negative for acute infarct, hemorrhage, mass.  No midline shift. Vascular: Negative for hyperdense vessel. Atherosclerotic calcification. Skull: Negative Sinuses/Orbits: Paranasal sinuses show mild mucosal edema. Bilateral ocular surgery Other: None IMPRESSION: Atrophy and chronic microvascular ischemia. No acute intracranial abnormality. Electronically Signed   By: Franchot Gallo M.D.   On: 12/06/2019 19:10   DG Hip Unilat W or Wo Pelvis 2-3 Views Right  Result Date: 12/06/2019 CLINICAL DATA:  86 year old female status post fall with right hip pain. EXAM: DG HIP (WITH OR WITHOUT PELVIS) 2-3V RIGHT COMPARISON:  Right hip series 12/15/2003. FINDINGS: Chronic fibroid uterus with dystrophic calcifications. Femoral heads are normally located. Hip joint spaces appear symmetric. The proximal right femur appears intact. The proximal left femur appear symmetric and grossly intact. No pelvic fracture is identified. Mild spurring at the pubic symphysis is chronic. Negative visible bowel gas pattern. Calcified femoral artery atherosclerosis. IMPRESSION: No acute fracture or dislocation identified about  the right hip or pelvis. Electronically Signed   By: Genevie Ann M.D.   On: 12/06/2019 20:55    PHYSICAL EXAM Frail elderly African-American lady not in distress. . Afebrile. Head is nontraumatic. Neck is supple without bruit.    Cardiac exam no murmur or gallop. Lungs are clear to auscultation. Distal pulses are well felt. Neurological Exam ;  Awake  Alert oriented x 3. Normal speech and language.eye movements full without nystagmus.fundi were not visualized. Vision acuity and fields appear normal. Hearing is normal. Palatal movements are normal. Face symmetric. Tongue midline. Normal strength, tone, reflexes and coordination. Except some giveaway weakness right knee extension due to pain Normal sensation. Gait deferred.  ASSESSMENT/PLAN [name] is a 86 y.o. female with history of CHF, DM, CAD, hypercholesterolemia, HTN, sleep apnea, prior stroke and memory dysfunction due to a concussion a few years ago presenting with an episode of slurred speech with left sided weakness.   Possible stroke or TIA, workup underway  CT head No acute abnormality. Small vessel disease. Atrophy.   Repeat CT head at 24h to confirm/refute stroke  Carotid Doppler  pending   2D Echo pending   LDL 119  HgbA1c 8.2  Lovenox 30 mg sq daily for VTE prophylaxis  clopidogrel 75 mg daily prior to admission, now on clopidogrel 75 mg daily.   Therapy recommendations:  CIR  Disposition:  pending   Hypertension  Stable . Permissive hypertension (OK if < 220/120) but gradually normalize in 5-7 days if imaging positive for stroke . Long-term BP goal normotensive  Hyperlipidemia  Home meds:  lipitor 80, resumed in hospital  LDL 119, goal < 70  Continue statin at discharge  Diabetes type II Uncontrolled  HgbA1c 8.2, goal < 7.0  Other Stroke Risk Factors  Advanced age  Former Cigarette smoker, quit 42 yrs ago  Obesity, Body mass index is 29.82 kg/m., recommend weight loss, diet and  exercise as appropriate   Hx stroke/TIA  06/2019 - probable TIA  07/2011 - L brain TIA  Coronary artery disease  Chronic systolic Congestive heart failure  Ischemic cardiomyopathy  Heart block with pacer pacer  obstructive sleep apnea on CPAP, followed by Dr[facility] Jann[facility] at [facility]  Other Active Problems  Baseline cognitive deficits - memory disturbance, followed by Dr. Jannifer Franklin at [facility]  Chronic anemia  [facility] day # 1 I have personally obtained history,examined this patient, reviewed notes, independently viewed imaging studies, participated in medical decision making and plan of care.ROS completed by me personally and pertinent positives fully documented  I have made any additions or clarifications directly to the above note.  She presented with a history of recurrent falls and unclear history of right or left leg weakness.  Neurological exam is fairly nonfocal except some giveaway weakness in the right leg but does have knee pain as well which may contribute.  Initial CT scan of the head on admission was negative but will  repeat another 1.  Cannot do an MRI due to pacemaker.  Continue ongoing stroke work-up.  Greater than 50% time during this 25-minute visit was spent on counseling and coordination of care answering questions  Antony Contras, MD Medical Director [place] Pager: [phone] 12/07/2019 4:39 PM   To contact Stroke Continuity provider, please refer to [email or web address] After hours, contact General Neurology

## 2019-12-08 ENCOUNTER — Inpatient Hospital Stay (HOSPITAL_COMMUNITY): Payer: Medicare Other

## 2019-12-08 ENCOUNTER — Other Ambulatory Visit: Payer: Self-pay

## 2019-12-08 DIAGNOSIS — I10 Essential (primary) hypertension: Secondary | ICD-10-CM

## 2019-12-08 DIAGNOSIS — N182 Chronic kidney disease, stage 2 (mild): Secondary | ICD-10-CM

## 2019-12-08 DIAGNOSIS — G4733 Obstructive sleep apnea (adult) (pediatric): Secondary | ICD-10-CM

## 2019-12-08 DIAGNOSIS — I2510 Atherosclerotic heart disease of native coronary artery without angina pectoris: Secondary | ICD-10-CM

## 2019-12-08 DIAGNOSIS — Z794 Long term (current) use of insulin: Secondary | ICD-10-CM

## 2019-12-08 DIAGNOSIS — Z8673 Personal history of transient ischemic attack (TIA), and cerebral infarction without residual deficits: Secondary | ICD-10-CM

## 2019-12-08 DIAGNOSIS — E1159 Type 2 diabetes mellitus with other circulatory complications: Secondary | ICD-10-CM

## 2019-12-08 DIAGNOSIS — I5032 Chronic diastolic (congestive) heart failure: Secondary | ICD-10-CM

## 2019-12-08 DIAGNOSIS — R531 Weakness: Secondary | ICD-10-CM

## 2019-12-08 LAB — CBC WITH DIFFERENTIAL/PLATELET
Abs Immature Granulocytes: 0.01 10*3/uL (ref 0.00–0.07)
Basophils Absolute: 0 10*3/uL (ref 0.0–0.1)
Basophils Relative: 0 %
Eosinophils Absolute: 0.1 10*3/uL (ref 0.0–0.5)
Eosinophils Relative: 1 %
HCT: 31.8 % — ABNORMAL LOW (ref 36.0–46.0)
Hemoglobin: 10.2 g/dL — ABNORMAL LOW (ref 12.0–15.0)
Immature Granulocytes: 0 %
Lymphocytes Relative: 48 %
Lymphs Abs: 3.1 10*3/uL (ref 0.7–4.0)
MCH: 28.7 pg (ref 26.0–34.0)
MCHC: 32.1 g/dL (ref 30.0–36.0)
MCV: 89.6 fL (ref 80.0–100.0)
Monocytes Absolute: 0.6 10*3/uL (ref 0.1–1.0)
Monocytes Relative: 9 %
Neutro Abs: 2.7 10*3/uL (ref 1.7–7.7)
Neutrophils Relative %: 42 %
Platelets: 217 10*3/uL (ref 150–400)
RBC: 3.55 MIL/uL — ABNORMAL LOW (ref 3.87–5.11)
RDW: 12.5 % (ref 11.5–15.5)
WBC: 6.5 10*3/uL (ref 4.0–10.5)
nRBC: 0 % (ref 0.0–0.2)

## 2019-12-08 LAB — GLUCOSE, CAPILLARY
Glucose-Capillary: 166 mg/dL — ABNORMAL HIGH (ref 70–99)
Glucose-Capillary: 158 mg/dL — ABNORMAL HIGH (ref 70–99)
Glucose-Capillary: 182 mg/dL — ABNORMAL HIGH (ref 70–99)
Glucose-Capillary: 199 mg/dL — ABNORMAL HIGH (ref 70–99)

## 2019-12-08 LAB — BASIC METABOLIC PANEL
Anion gap: 9 (ref 5–15)
BUN: 19 mg/dL (ref 8–23)
CO2: 25 mmol/L (ref 22–32)
Calcium: 10.9 mg/dL — ABNORMAL HIGH (ref 8.9–10.3)
Chloride: 103 mmol/L (ref 98–111)
Creatinine, Ser: 1.26 mg/dL — ABNORMAL HIGH (ref 0.44–1.00)
GFR calc Af Amer: 44 mL/min — ABNORMAL LOW (ref >60)
GFR calc non Af Amer: 38 mL/min — ABNORMAL LOW (ref >60)
Glucose, Bld: 168 mg/dL — ABNORMAL HIGH (ref 70–99)
Potassium: 3.9 mmol/L (ref 3.5–5.1)
Sodium: 137 mmol/L (ref 135–145)

## 2019-12-08 LAB — URINE CULTURE

## 2019-12-08 NOTE — Progress Notes (Signed)
Physical Therapy Treatment Patient Details Name: [name] MRN: [id number] DOB: 1927/12/26 Today's Date: 12/08/2019    History of Present Illness Pt is a 86 y.o. F with significant PMH of CHF, DM, CAD with pacemaker, HTN, prior stroke, who presents with several falls in the prior 24 hours and RLE weakness. CT negative for acute abnormality. Pt unable to receive MRI due to pacemaker.     PT Comments    Pt with significant decline in function and required increased assistance.  She presents with increased facial assymetry and speech is slurred.   Only able to perform pivot from bed to recliner.  Will continue to recommend CIR at this time.    Follow Up Recommendations  CIR     Equipment Recommendations  Wheelchair (measurements PT)    Recommendations for Other Services Rehab consult     Precautions / Restrictions Precautions Precautions: Fall;Other (comment) Precaution Comments: R knee buckle and scissoring.  Significant decline in function from last session. Restrictions Weight Bearing Restrictions: No    Mobility  Bed Mobility Overal bed mobility: Needs Assistance Bed Mobility: Supine to Sit     Supine to sit: Mod assist     General bed mobility comments: Pt with significant decline in function and required assistance to advance LEs to edge of bed and to elevate trunk into a seated position.  She presents with a posterior lean and required constant cues to correct posture.  Transfers Overall transfer level: Needs assistance Equipment used: Rolling walker (2 wheeled) Transfers: Sit to/from Stand Sit to Stand: Mod assist;Max assist(Moderated assistance to rise to standing and max assistance to maintain standing.)         General transfer comment: Pt with significant decline in function this session.  She required max assistance to maintain standing balance with strong lean to the R and innability to keep R UE on device.  Ambulation/Gait Ambulation/Gait  assistance: Max assist Gait Distance (Feet): 4 Feet(attempted steps from bed to recliner and she was increasingly unsteady with noted scissoring of RLE past midline.  She was able to take steps to recliner but then required recliner to be brought to her bottom as she became increasingly unsteady.) Assistive device: Rolling walker (2 wheeled) Gait Pattern/deviations: Step-through pattern;Decreased stride length;Trunk flexed;Scissoring Gait velocity: decreased   General Gait Details: Pt with increased trunk flexion,  and strong R lateral lean.  She was unable to complete steps to recliner before requiring chair to be pulled up to her bottom.   Stairs             Wheelchair Mobility    Modified Rankin (Stroke Patients Only) Modified Rankin (Stroke Patients Only) Pre-Morbid Rankin Score: Moderate disability Modified Rankin: Moderately severe disability     Balance Overall balance assessment: Needs assistance   Sitting balance-Leahy Scale: Fair       Standing balance-Leahy Scale: Poor                              Cognition Arousal/Alertness: Awake/alert Behavior During Therapy: WFL for tasks assessed/performed Overall Cognitive Status: History of cognitive impairments - at baseline Area of Impairment: Memory;Safety/judgement;Awareness;Problem solving                     Memory: Decreased short-term memory   Safety/Judgement: Decreased awareness of safety;Decreased awareness of deficits Awareness: Emergent Problem Solving: Requires verbal cues General Comments: pt requiring cues and support for multistep tasks for safety  Exercises General Exercises - Lower Extremity Long Arc Quad: AROM;Right;10 reps;AAROM;Seated Other Exercises Other Exercises: R shoulder flexion, and scapular retraction active assisted ROM.  1x10 reps.    General Comments        Pertinent Vitals/Pain Pain Assessment: No/denies pain    Home Living                       Prior Function            PT Goals (current goals can now be found in the care plan section) Acute Rehab PT Goals Patient Stated Goal: be able to take care of self again Potential to Achieve Goals: Good Progress towards PT goals: Progressing toward goals    Frequency    Min 3X/week      PT Plan Current plan remains appropriate    Co-evaluation              AM-PAC PT "6 Clicks" Mobility   Outcome Measure  Help needed turning from your back to your side while in a flat bed without using bedrails?: A Lot Help needed moving from lying on your back to sitting on the side of a flat bed without using bedrails?: A Lot Help needed moving to and from a bed to a chair (including a wheelchair)?: Total Help needed standing up from a chair using your arms (e.g., wheelchair or bedside chair)?: Total Help needed to walk in hospital room?: Total Help needed climbing 3-5 steps with a railing? : Total 6 Click Score: 8    End of Session Equipment Utilized During Treatment: Gait belt Activity Tolerance: Patient tolerated treatment well Patient left: in chair;with call bell/phone within reach;with chair alarm set Nurse Communication: Mobility status PT Visit Diagnosis: Unsteadiness on feet (R26.81);History of falling (Z91.81);Difficulty in walking, not elsewhere classified (R26.2)     Time: 4174-0814 PT Time Calculation (min) (ACUTE ONLY): 34 min  Charges:  $Therapeutic Exercise: 8-22 mins $Therapeutic Activity: 8-22 mins                     Erasmo Leventhal , PTA Acute Rehabilitation Services Pager [phone] Office [phone]     Radames Mejorado Eli Hose 12/08/2019, 4:02 PM

## 2019-12-08 NOTE — Consult Note (Signed)
Physical Medicine and Rehabilitation Consult Reason for Consult: Slurred speech and left-sided weakness Referring Physician: [facility]   HPI: [name] is a 86 y.o. right-handed female with history of CAD with stenting, CKD stage II with creatinine 1.56, CVA maintained on Plavix, memory loss maintained on Aricept, heart block with pacemaker, hypertension, type 2 diabetes mellitus, OSA with CPAP, chronic diastolic congestive heart failure.  History taken from chart review due to cognition.  Patient lives with family.  1 level home.  Used a straight point cane for mobility daughter assist with bathing.  She presented on 12/07/2019 after a fall without LOC.  She was noted to have slurred speech and left hemiparesis.  Cranial CT unremarkable for acute intracranial process.  Patient did not receive TPA.  MRI not completed due to pacemaker.  Echocardiogram showed ejection fraction of 55% without emboli.  Neurology follow-up currently remains on Plavix as prior to admission.  Subcutaneous Lovenox for DVT prophylaxis.  Admission chemistries with creatinine 1.62, SARS coronavirus negative.  Tolerating a regular diet.  Therapy evaluations completed with recommendations of physical medicine rehab consult.   Review of Systems  Unable to perform ROS: Mental acuity   Past Medical History:  Diagnosis Date  . Anemia   . Arthritis   . Celiac disease   . Chronic systolic CHF (congestive heart failure) ([place])   . Complete heart block ([place])   . Coronary artery disease   . Diabetes mellitus    INSULIN DEPENDENT  . GERD (gastroesophageal reflux disease)   . Glaucoma   . Headache(784.0)   . Heart disease   . Hypercholesterolemia   . Hypertension   . Iron deficiency anemia, unspecified   . Ischemic cardiomyopathy   . Memory difficulties 04/14/2019  . Shortness of breath   . Sleep apnea    uses cpap  . Stroke ([place])   . Unspecified constipation    Past Surgical History:  Procedure Laterality  Date  . BACK SURGERY    . BI-VENTRICULAR PACEMAKER INSERTION (CRT-P)  March 2014   [facility]  . BIV PACEMAKER GENERATOR CHANGEOUT N/A 06/29/2019   Procedure: BIV PACEMAKER GENERATOR CHANGEOUT;  Surgeon: Evans Lance, MD;  Location: [facility];  Service: Cardiovascular;  Laterality: N/A;  . CARDIAC CATHETERIZATION  09/02/2012  . CARDIAC SURGERY    . CORONARY ANGIOPLASTY WITH STENT PLACEMENT  09/02/2012   RCA  . PERCUTANEOUS CORONARY STENT INTERVENTION (PCI-S) N/A 09/02/2012   Procedure: PERCUTANEOUS CORONARY STENT INTERVENTION (PCI-S);  Surgeon: Clent Demark, MD;  Location: [facility] CATH LAB;  Service: Cardiovascular;  Laterality: N/A;  . PERMANENT PACEMAKER INSERTION N/A 11/29/2012   Procedure: PERMANENT PACEMAKER INSERTION;  Surgeon: Deboraha Sprang, MD;  Location: [facility] CATH LAB;  Service: Cardiovascular;  Laterality: N/A;  . TEMPORARY PACEMAKER INSERTION N/A 11/28/2012   Procedure: TEMPORARY PACEMAKER INSERTION;  Surgeon: Clent Demark, MD;  Location: [facility];  Service: Cardiovascular;  Laterality: N/A;   Family History  Problem Relation Age of Onset  . Diabetes Mother   . Hypertension Mother   . Hyperlipidemia Mother   . Cancer Sister   . Dementia Sister   . Neuropathy Sister    Social History:  reports that she quit smoking about 42 years ago. She has never used smokeless tobacco. She reports that she does not drink alcohol or use drugs. Allergies: No Known Allergies Medications Prior to Admission  Medication Sig Dispense Refill  . amLODipine (NORVASC) 2.5 MG tablet Take  2.5 mg by mouth every morning.    Marland Kitchen atorvastatin (LIPITOR) 80 MG tablet Take 80 mg by mouth every morning.   5  . brimonidine-timolol (COMBIGAN) 0.2-0.5 % ophthalmic solution Place 1 drop into both eyes 2 (two) times daily.    . calcitRIOL (ROCALTROL) 0.25 MCG capsule Take 0.25 mcg by mouth every morning.    . carvedilol (COREG) 3.125 MG tablet Take 3.125 mg by mouth 2 (two) times daily.  3  .  clopidogrel (PLAVIX) 75 MG tablet Take 1 tablet (75 mg total) by mouth daily. 30 tablet 0  . Cod Liver Oil CAPS Take 1 capsule by mouth at bedtime.     . donepezil (ARICEPT) 10 MG tablet Take 10 mg by mouth at bedtime.     . dorzolamide (TRUSOPT) 2 % ophthalmic solution Place 1 drop into both eyes 2 (two) times daily.     . ferrous sulfate 325 (65 FE) MG tablet Take 325 mg by mouth daily with breakfast.    . insulin degludec (TRESIBA FLEXTOUCH) 100 UNIT/ML SOPN FlexTouch Pen Inject 10-20 Units into the skin See admin instructions. Inject 10-20 units into the skin at bedtime, based upon CGB (home capillary blood glucose)    . Insulin Lispro (HUMALOG KWIKPEN) 200 UNIT/ML SOPN Inject 6 Units into the skin 3 (three) times daily as needed (high blood sugar).     Marland Kitchen latanoprost (XALATAN) 0.005 % ophthalmic solution Appointment OVERDUE, place 1 drop into both eyes daily at bedtime (Patient taking differently: Place 1 drop into both eyes at bedtime. ) 2.5 mL 0  . linagliptin (TRADJENTA) 5 MG TABS tablet Take 5 mg by mouth every morning.    . loperamide (IMODIUM) 2 MG capsule Take 2 mg by mouth as needed for diarrhea or loose stools.    . Multiple Vitamin (MULTIVITAMIN WITH MINERALS) TABS Take 1 tablet by mouth at bedtime.     . Multiple Vitamins-Minerals (EYE VITAMINS) CAPS Take 1 capsule by mouth every morning.    . nitroGLYCERIN (NITROSTAT) 0.4 MG SL tablet Place 0.4 mg under the tongue every 5 (five) minutes as needed for chest pain.     . pantoprazole (PROTONIX) 40 MG tablet Take 40 mg by mouth daily before breakfast.     . valsartan-hydrochlorothiazide (DIOVAN-HCT) 320-12.5 MG tablet Take 1 tablet by mouth every morning.     Marland Kitchen NOVOFINE 32G X 6 MM MISC       Home: Home Living Family/patient expects to be discharged to:: Private residence Living Arrangements: Other (Comment)(granddaughter, daughter next door) Available Help at Discharge: Family Type of Home: House Home Access: Level entry Home  Layout: One level Bathroom Shower/Tub: Chiropodist: Foster: Radio producer - single point, Engineer, civil (consulting), Shower seat  Lives With: Son, Daughter  Functional History: Prior Function Level of Independence: Needs assistance Gait / Transfers Assistance Needed: pt reports use of SPC for mobility ADL's / Homemaking Assistance Needed: pt's daughter assists with bathing Comments: aide M-F 8 hours a day (? pt difficulty reporting time) helps with BADL Functional Status:  Mobility: Bed Mobility Overal bed mobility: Needs Assistance Bed Mobility: Supine to Sit Supine to sit: Min assist General bed mobility comments: min A to EOB to facilitate upright position and cues to scoot EOB for increased sitting balance and stability Transfers Overall transfer level: Needs assistance Equipment used: Rolling walker (2 wheeled) Transfers: Sit to/from Stand Sit to Stand: Min assist, +2 physical assistance, +2 safety/equipment General transfer comment: min A +2 for safety  with RW and hand placement, pt with R knee buckle Ambulation/Gait Ambulation/Gait assistance: Mod assist, +2 physical assistance Gait Distance (Feet): 8 Feet Assistive device: Rolling walker (2 wheeled) Gait Pattern/deviations: Step-through pattern, Decreased stride length, Trunk flexed General Gait Details: Pt with increased trunk flexion, cues provided for upright posture, stepping initiation. R knee buckle with fatigue, necessitating chair to be pulled up behind her. Gait velocity: decreased    ADL: ADL Overall ADL's : Needs assistance/impaired Eating/Feeding: Set up, Sitting Grooming: Set up, Sitting Grooming Details (indicate cue type and reason): attempted to stand, pt could not sustain with R knee buckle and needing to sit Upper Body Bathing: Minimal assistance, Sitting Lower Body Bathing: Minimal assistance, Sit to/from stand, Sitting/lateral leans Upper Body Dressing : Minimal assistance,  Sitting Lower Body Dressing: Minimal assistance, Sit to/from stand, Sitting/lateral leans Lower Body Dressing Details (indicate cue type and reason): increased time and effort to don socks, min support to keep legs crossed when donning B socks Toilet Transfer: Minimal assistance, +2 for physical assistance, +2 for safety/equipment, RW Toilet Transfer Details (indicate cue type and reason): for safety, R knee buckle Toileting- Clothing Manipulation and Hygiene: Minimal assistance, Sit to/from stand Functional mobility during ADLs: Minimal assistance, +2 for physical assistance, +2 for safety/equipment, Rolling walker General ADL Comments: pt limited by generalized weakness, increased fall risk, R knee buckle, and cognitive deficits for engagement in BADL  Cognition: Cognition Overall Cognitive Status: History of cognitive impairments - at baseline Arousal/Alertness: Awake/alert Orientation Level: Oriented to person, Oriented to place, Oriented to situation, Disoriented to time Attention: Sustained Sustained Attention: Appears intact Memory: Impaired Memory Impairment: Prospective memory, Decreased short term memory Decreased Short Term Memory: Verbal basic Cognition Arousal/Alertness: Awake/alert Behavior During Therapy: WFL for tasks assessed/performed Overall Cognitive Status: History of cognitive impairments - at baseline Area of Impairment: Memory, Safety/judgement, Awareness, Problem solving Memory: Decreased short-term memory Safety/Judgement: Decreased awareness of safety, Decreased awareness of deficits Awareness: Emergent Problem Solving: Requires verbal cues General Comments: pt requiring cues and support for multistep tasks for safety  Blood pressure (!) 140/59, pulse 79, temperature 98.2 F (36.8 C), temperature source Oral, resp. rate 17, weight 78.8 kg, SpO2 97 %. Physical Exam  Vitals reviewed. Constitutional: She appears well-developed and well-nourished.  HENT:   Head: Normocephalic and atraumatic.  Eyes: Right eye exhibits no discharge. Left eye exhibits no discharge.  Extraocular movements appear to be intact, however?  Left gaze preference  Neck: No tracheal deviation present. No thyromegaly present.  Respiratory: Effort normal. No respiratory distress.  GI: Soft. She exhibits no distension.  Musculoskeletal:     Comments: No edema or tenderness in extremities  Neurological: She is alert.  Unable to answer orientation questions, confused Expressive >receptive aphasia Motor: Freely moving left side RUE: 3 -/5 proximal distal RLE: 2+-3-/5 proximal to distal  Skin: Skin is warm and dry.  Psychiatric: Her affect is blunt. Her speech is delayed and slurred. She is slowed.    Results for orders placed or performed during the hospital encounter of 12/06/19 (from the past 24 hour(s))  Glucose, capillary     Status: Abnormal   Collection Time: 12/07/19  6:19 AM  Result Value Ref Range   Glucose-Capillary 142 (H) 70 - 99 mg/dL  CBC with Differential/Platelet     Status: Abnormal   Collection Time: 12/07/19  7:48 AM  Result Value Ref Range   WBC 6.8 4.0 - 10.5 K/uL   RBC 3.75 (L) 3.87 - 5.11 MIL/uL   Hemoglobin  10.8 (L) 12.0 - 15.0 g/dL   HCT 34.9 (L) 36.0 - 46.0 %   MCV 93.1 80.0 - 100.0 fL   MCH 28.8 26.0 - 34.0 pg   MCHC 30.9 30.0 - 36.0 g/dL   RDW 12.4 11.5 - 15.5 %   Platelets 199 150 - 400 K/uL   nRBC 0.0 0.0 - 0.2 %   Neutrophils Relative % 41 %   Neutro Abs 2.8 1.7 - 7.7 K/uL   Lymphocytes Relative 50 %   Lymphs Abs 3.4 0.7 - 4.0 K/uL   Monocytes Relative 8 %   Monocytes Absolute 0.6 0.1 - 1.0 K/uL   Eosinophils Relative 1 %   Eosinophils Absolute 0.1 0.0 - 0.5 K/uL   Basophils Relative 0 %   Basophils Absolute 0.0 0.0 - 0.1 K/uL   Immature Granulocytes 0 %   Abs Immature Granulocytes 0.01 0.00 - 0.07 K/uL  Basic metabolic panel     Status: Abnormal   Collection Time: 12/07/19  7:48 AM  Result Value Ref Range   Sodium 141  135 - 145 mmol/L   Potassium 3.9 3.5 - 5.1 mmol/L   Chloride 103 98 - 111 mmol/L   CO2 25 22 - 32 mmol/L   Glucose, Bld 152 (H) 70 - 99 mg/dL   BUN 17 8 - 23 mg/dL   Creatinine, Ser 1.24 (H) 0.44 - 1.00 mg/dL   Calcium 10.7 (H) 8.9 - 10.3 mg/dL   GFR calc non Af Amer 38 (L) >60 mL/min   GFR calc Af Amer 45 (L) >60 mL/min   Anion gap 13 5 - 15  Glucose, capillary     Status: Abnormal   Collection Time: 12/07/19  8:31 AM  Result Value Ref Range   Glucose-Capillary 143 (H) 70 - 99 mg/dL  Glucose, capillary     Status: Abnormal   Collection Time: 12/07/19 11:50 AM  Result Value Ref Range   Glucose-Capillary 223 (H) 70 - 99 mg/dL  Glucose, capillary     Status: Abnormal   Collection Time: 12/07/19  4:47 PM  Result Value Ref Range   Glucose-Capillary 125 (H) 70 - 99 mg/dL  Glucose, capillary     Status: Abnormal   Collection Time: 12/07/19  9:14 PM  Result Value Ref Range   Glucose-Capillary 129 (H) 70 - 99 mg/dL   Comment 1 Notify RN    Comment 2 Document in Chart   Urinalysis, Routine w reflex microscopic     Status: Abnormal   Collection Time: 12/07/19  9:30 PM  Result Value Ref Range   Color, Urine STRAW (A) YELLOW   APPearance CLEAR CLEAR   Specific Gravity, Urine 1.005 1.005 - 1.030   pH 6.0 5.0 - 8.0   Glucose, UA NEGATIVE NEGATIVE mg/dL   Hgb urine dipstick NEGATIVE NEGATIVE   Bilirubin Urine NEGATIVE NEGATIVE   Ketones, ur NEGATIVE NEGATIVE mg/dL   Protein, ur NEGATIVE NEGATIVE mg/dL   Nitrite NEGATIVE NEGATIVE   Leukocytes,Ua MODERATE (A) NEGATIVE   RBC / HPF 0-5 0 - 5 RBC/hpf   WBC, UA 0-5 0 - 5 WBC/hpf   Bacteria, UA RARE (A) NONE SEEN   Squamous Epithelial / LPF 0-5 0 - 5  CBC with Differential/Platelet     Status: Abnormal   Collection Time: 12/08/19  3:48 AM  Result Value Ref Range   WBC 6.5 4.0 - 10.5 K/uL   RBC 3.55 (L) 3.87 - 5.11 MIL/uL   Hemoglobin 10.2 (L) 12.0 - 15.0 g/dL  HCT 31.8 (L) 36.0 - 46.0 %   MCV 89.6 80.0 - 100.0 fL   MCH 28.7 26.0 -  34.0 pg   MCHC 32.1 30.0 - 36.0 g/dL   RDW 12.5 11.5 - 15.5 %   Platelets 217 150 - 400 K/uL   nRBC 0.0 0.0 - 0.2 %   Neutrophils Relative % 42 %   Neutro Abs 2.7 1.7 - 7.7 K/uL   Lymphocytes Relative 48 %   Lymphs Abs 3.1 0.7 - 4.0 K/uL   Monocytes Relative 9 %   Monocytes Absolute 0.6 0.1 - 1.0 K/uL   Eosinophils Relative 1 %   Eosinophils Absolute 0.1 0.0 - 0.5 K/uL   Basophils Relative 0 %   Basophils Absolute 0.0 0.0 - 0.1 K/uL   Immature Granulocytes 0 %   Abs Immature Granulocytes 0.01 0.00 - 0.07 K/uL  Basic metabolic panel     Status: Abnormal   Collection Time: 12/08/19  3:48 AM  Result Value Ref Range   Sodium 137 135 - 145 mmol/L   Potassium 3.9 3.5 - 5.1 mmol/L   Chloride 103 98 - 111 mmol/L   CO2 25 22 - 32 mmol/L   Glucose, Bld 168 (H) 70 - 99 mg/dL   BUN 19 8 - 23 mg/dL   Creatinine, Ser 1.26 (H) 0.44 - 1.00 mg/dL   Calcium 10.9 (H) 8.9 - 10.3 mg/dL   GFR calc non Af Amer 38 (L) >60 mL/min   GFR calc Af Amer 44 (L) >60 mL/min   Anion gap 9 5 - 15   DG Chest 2 View  Result Date: 12/07/2019 CLINICAL DATA:  Frequent falls. EXAM: CHEST - 2 VIEW COMPARISON:  08/07/2015 FINDINGS: Moderate thoracic spondylosis. Pacer with leads at right atrium and right ventricle. Patient rotated right on the frontal. Normal heart size. No pleural effusion or pneumothorax. Clear lungs. IMPRESSION: No acute cardiopulmonary disease. Electronically Signed   By: Abigail Miyamoto M.D.   On: 12/07/2019 09:40   DG Knee 1-2 Views Left  Result Date: 12/07/2019 CLINICAL DATA:  Knee pain, bilateral EXAM: LEFT KNEE - 1-2 VIEW COMPARISON:  10/16/2012 FINDINGS: Tricompartmental osteoarthritic change with complete loss of the joint space in the medial compartment. No signs of acute fracture. Small joint effusion. IMPRESSION: Tricompartmental osteoarthritic change greatest in medial compartment with small joint effusion. Electronically Signed   By: Zetta Bills M.D.   On: 12/07/2019 20:10   DG Knee 1-2  Views Right  Result Date: 12/07/2019 CLINICAL DATA:  Bilateral knee plain select plain pain EXAM: RIGHT KNEE - 1-2 VIEW COMPARISON:  October 16, 2012 FINDINGS: Marked tricompartmental osteoarthritic change greatest in medial compartment with complete loss of the joint space and marginal osteophytes, worse than in 2013. Osteopenia. No acute fracture. Postoperative changes about the medial left thigh and posterior lower extremity may reflect changes of previous graft harvest. IMPRESSION: 1. Marked tricompartmental osteoarthritic change greatest in the medial compartment of the right knee, worse than in 2013. 2. Osteopenia. Electronically Signed   By: Zetta Bills M.D.   On: 12/07/2019 20:08   CT HEAD WO CONTRAST  Result Date: 12/07/2019 CLINICAL DATA:  Stroke follow-up EXAM: CT HEAD WITHOUT CONTRAST TECHNIQUE: Contiguous axial images were obtained from the base of the skull through the vertex without intravenous contrast. COMPARISON:  Head CT December 06, 2019 FINDINGS: Brain: No evidence of acute infarction, hemorrhage, hydrocephalus, extra-axial collection or mass lesion/mass effect. Confluent hypodensity in the periventricular white matter, nonspecific, most likely related to chronic  small vessel ischemia, unchanged. Prominence of the supratentorial ventricles and cerebral sulci reflecting parenchymal volume loss. Vascular: No hyperdense vessel or unexpected calcification. Skull: Normal. Negative for fracture or focal lesion. Sinuses/Orbits: Bilateral ocular implants (GDD). Other: None. IMPRESSION: No acute intracranial abnormality. No significant change from prior. Electronically Signed   By: Pedro Earls M.D.   On: 12/07/2019 15:00   CT HEAD WO CONTRAST  Result Date: 12/06/2019 CLINICAL DATA:  Possible stroke EXAM: CT HEAD WITHOUT CONTRAST TECHNIQUE: Contiguous axial images were obtained from the base of the skull through the vertex without intravenous contrast. COMPARISON:  CT head  11/07/2019 FINDINGS: Brain: Moderate atrophy. Mild chronic microvascular ischemic changes in the white matter. Negative for acute infarct, hemorrhage, mass.  No midline shift. Vascular: Negative for hyperdense vessel. Atherosclerotic calcification. Skull: Negative Sinuses/Orbits: Paranasal sinuses show mild mucosal edema. Bilateral ocular surgery Other: None IMPRESSION: Atrophy and chronic microvascular ischemia. No acute intracranial abnormality. Electronically Signed   By: Franchot Gallo M.D.   On: 12/06/2019 19:10   ECHOCARDIOGRAM COMPLETE  Result Date: 12/07/2019    ECHOCARDIOGRAM REPORT   Patient Name:   [name] Date of Exam: 12/07/2019 Medical Rec #:  [id number]         Height:       64.0 in Accession #:    [id number]        Weight:       173.7 lb Date of Birth:  [birth year]         BSA:          1.84 m Patient Age:    20 years          BP:           128/56 mmHg Patient Gender: F                 HR:           77 bpm. Exam Location:  Inpatient Procedure: 2D Echo Indications:    weakness  History:        Patient has prior history of Echocardiogram examinations, most                 recent 07/02/2019. Cardiomyopathy and CHF, CAD, Pacemaker,                 Stroke; Risk Factors:Hypertension, Dyslipidemia, Diabetes and                 Former Smoker. Complete heart block.  Sonographer:    Jannett Celestine RDCS (AE) Referring Phys: [id number] [place] T TU  Sonographer Comments: Suboptimal apical window and suboptimal subcostal window. limited mobility. patient unable to turn entirely on left side (partially). apical windows obtained supine. IMPRESSIONS  1. Left ventricular ejection fraction, by estimation, is 50 to 55%. The left ventricle has low normal function. The left ventricle has no regional wall motion abnormalities. There is mild concentric left ventricular hypertrophy. Left ventricular diastolic parameters are indeterminate.  2. Right ventricular systolic function is mildly reduced. The right ventricular  size is normal. There is severely elevated pulmonary artery systolic pressure.  3. The mitral valve is degenerative. Mild mitral valve regurgitation.  4. The aortic valve is tricuspid. Aortic valve regurgitation is not visualized. No aortic stenosis is present. FINDINGS  Left Ventricle: Left ventricular ejection fraction, by estimation, is 50 to 55%. The left ventricle has low normal function. The left ventricle has no regional wall motion abnormalities. The left ventricular internal cavity size was normal  in size. There is mild concentric left ventricular hypertrophy. Left ventricular diastolic parameters are indeterminate. Right Ventricle: The right ventricular size is normal. No increase in right ventricular wall thickness. Right ventricular systolic function is mildly reduced. There is severely elevated pulmonary artery systolic pressure. The tricuspid regurgitant velocity is 2.23 m/s, and with an assumed right atrial pressure of 87 mmHg, the estimated right ventricular systolic pressure is 622.2 mmHg. Left Atrium: Left atrial size was not well visualized. Right Atrium: Right atrial size was not well visualized. Pericardium: There is no evidence of pericardial effusion. Mitral Valve: The mitral valve is degenerative in appearance. There is mild thickening of the mitral valve leaflet(s). There is mild calcification of the mitral valve leaflet(s). Mild mitral valve regurgitation. Tricuspid Valve: The tricuspid valve is normal in structure. Tricuspid valve regurgitation is mild . No evidence of tricuspid stenosis. Aortic Valve: The aortic valve is tricuspid. . There is severe thickening and severe calcifcation of the aortic valve. Aortic valve regurgitation is not visualized. No aortic stenosis is present. There is severe thickening of the aortic valve. There is severe calcifcation of the aortic valve. Pulmonic Valve: The pulmonic valve was grossly normal. Pulmonic valve regurgitation is not visualized. No evidence  of pulmonic stenosis. Aorta: The aortic root is normal in size and structure. Venous: The inferior vena cava was not well visualized. IAS/Shunts: The atrial septum is grossly normal. Additional Comments: A pacer wire is visualized.  LEFT VENTRICLE PLAX 2D LVIDd:         3.10 cm  Diastology LVIDs:         2.53 cm  LV e' lateral:   4.00 cm/s LV PW:         1.20 cm  LV E/e' lateral: 19.4 LV IVS:        1.40 cm  LV e' medial:    4.00 cm/s LVOT diam:     2.00 cm  LV E/e' medial:  19.4 LV SV:         37.70 ml LV SV Index:   7.77 LVOT Area:     3.14 cm  RIGHT VENTRICLE RV S prime:     10.00 cm/s TAPSE (M-mode): 1.6 cm LEFT ATRIUM             Index       RIGHT ATRIUM           Index LA diam:        2.80 cm 1.52 cm/m  RA Area:     11.60 cm LA Vol (A2C):   35.3 ml 19.16 ml/m RA Volume:   21.00 ml  11.40 ml/m LA Vol (A4C):   32.6 ml 17.69 ml/m LA Biplane Vol: 36.2 ml 19.65 ml/m  AORTIC VALVE LVOT Vmax:   60.60 cm/s LVOT Vmean:  39.800 cm/s LVOT VTI:    0.120 m MITRAL VALVE               TRICUSPID VALVE MV Area (PHT): 2.29 cm    TR Peak grad:   19.9 mmHg MV Decel Time: 331 msec    TR Vmax:        223.00 cm/s MV E velocity: 77.60 cm/s                            SHUNTS  Systemic VTI:  0.12 m                            Systemic Diam: 2.00 cm Buford Dresser MD Electronically signed by Buford Dresser MD Signature Date/Time: 12/07/2019/9:19:01 PM    Final    DG Hip Unilat W or Wo Pelvis 2-3 Views Right  Result Date: 12/06/2019 CLINICAL DATA:  86 year old female status post fall with right hip pain. EXAM: DG HIP (WITH OR WITHOUT PELVIS) 2-3V RIGHT COMPARISON:  Right hip series 12/15/2003. FINDINGS: Chronic fibroid uterus with dystrophic calcifications. Femoral heads are normally located. Hip joint spaces appear symmetric. The proximal right femur appears intact. The proximal left femur appear symmetric and grossly intact. No pelvic fracture is identified. Mild spurring at the pubic  symphysis is chronic. Negative visible bowel gas pattern. Calcified femoral artery atherosclerosis. IMPRESSION: No acute fracture or dislocation identified about the right hip or pelvis. Electronically Signed   By: Genevie Ann M.D.   On: 12/06/2019 20:55    Assessment/Plan: Diagnosis: Likely left brain infarct Stroke: Continue secondary stroke prophylaxis and Risk Factor Modification listed below:   Antiplatelet therapy:   Blood Pressure Management:  Continue current medication with prn's with permisive HTN per primary team Statin Agent:   Diabetes management:   Right sided hemiparesis: fit for orthosis to prevent contractures as necessary (resting hand splint for day, wrist cock up splint at night, PRAFO, etc) PT/OT for mobility, ADL training  Labs independently reviewed.  Records reviewed and summated above.  1. Does the need for close, 24 hr/day medical supervision in concert with the patient's rehab needs make it unreasonable for this patient to be served in a less intensive setting? Yes and Potentially 2. Co-Morbidities requiring supervision/potential complications: CAD, CKD stage II (repeat labs, avoid nephrotoxic medications), history of CVA, memory loss,, heart block with pacemaker, HTN (monitor and provide prns in accordance with increased physical exertion and pain), type 2 DM (Monitor in accordance with exercise and adjust meds as necessary), OSA with CPAP (monitor daytime somnolence), CHF (Monitor in accordance with increased physical activity and avoid UE resistance excercises) 3. Due to bladder management, bowel management, safety, disease management, medication administration and patient education, does the patient require 24 hr/day rehab nursing? Yes 4. Does the patient require coordinated care of a physician, rehab nurse, therapy disciplines of PT/OT/SLP to address physical and functional deficits in the context of the above medical diagnosis(es)? Yes Addressing deficits in the  following areas: balance, endurance, locomotion, strength, transferring, bathing, dressing, toileting, cognition, speech, language and psychosocial support 5. Can the patient actively participate in an intensive therapy program of at least 3 hrs of therapy per day at least 5 days per week? Yes 6. The potential for patient to make measurable gains while on inpatient rehab is excellent 7. Anticipated functional outcomes upon discharge from inpatient rehab are supervision and min assist  with PT, supervision and min assist with OT, supervision and min assist with SLP. 8. Estimated rehab length of stay to reach the above functional goals is: 14-18 days. 9. Anticipated discharge destination: Home 10. Overall Rehab/Functional Prognosis: good  RECOMMENDATIONS: This patient's condition is appropriate for continued rehabilitative care in the following setting: Likely CIR, will need to await completion of medical work-up. Patient has agreed to participate in recommended program. Potentially Note that insurance prior authorization may be required for reimbursement for recommended care.  Comment: Rehab Admissions Coordinator to follow up.  I have  personally performed a face to face diagnostic evaluation, including, but not limited to relevant history and physical exam findings, of this patient and developed relevant assessment and plan.  Additionally, I have reviewed and concur with the physician assistant's documentation above.   Delice Lesch, MD, ABPMR Lavon Paganini Angiulli, PA-C 12/08/2019

## 2019-12-08 NOTE — Progress Notes (Signed)
PROGRESS NOTE  [name] JHE:[id number] DOB: 1928-07-10 DOA: 12/06/2019 PCP: [name], MD  HPI/Recap of past 24 hours: HPI from Dr Felizardo Hoffmann is a 86 y.o. female with medical history significant for CAD, CVA on Plavix, heart block s/p pacemaker, hypertension, Type 2 diabetes, OSA on CPAP who presents for concerns of a fall. Patient was unable to recall much of her symptoms and there was no family at bedside. Patient states that she was walking with her cane and when she turned she fell onto the ground and hit her head.  Leg weakness.  She denies any dizziness or loss of consciousness.  Thinks she might have fell another time prior to this but could not recall the event. Per ED documentation, daughter brought patient and due to concerns of slurred speech and left-sided weakness. All symptoms resolved in the ED. In the ER, VSS. Labs showed creatinine elevated at 1.62 from a prior of 1.24.  CT head negative for any acute abnormalities. EDP discussed with neurology who recommends admission.    Today, noted patient with worsening right facial droop, with worsening right upper and lower extremity weakness, repeat CT head negative for any acute intracranial abnormality.  Patient denied any chest pain, shortness of breath, abdominal pain, nausea/vomiting, fever/chills.      Assessment/Plan: Principal Problem:   Weakness Active Problems:   Hypercholesterolemia   Hypertension   Type 2 diabetes mellitus (HCC)   Obstructive sleep apnea   Pacemaker   CKD (chronic kidney disease), stage II   History of CVA (cerebrovascular accident)   OSA (obstructive sleep apnea)   Chronic diastolic congestive heart failure ([place])  Likely CVA Presents with RLE weakness, slurred speech Initial CT head negative for any acute intracranial abnormality, repeat X 2 negative as well Unable to obtain MRI brain due to incompatible pacemaker Neurology on board Echo showed EF of 50 to 55%, no  regional wall motion abnormality, no mention of any cardiac thrombus, carotid Doppler pending A1c, 8.2, LDL 119 Continue Plavix, Lipitor PT/OT/SLP Frequent neurochecks, telemetry  CKD stage IIIa Creatinine back to baseline Daily BMP  Chronic normocytic anemia Hemoglobin at baseline Continue ferrous sulfate Daily CBC  Diabetes mellitus type 2 Uncontrolled A1c 8.2 SSI, Accu-Cheks, hypoglycemic protocol Hold home insulin regimen, Tradjenta  Hypertension Allow for permissive hypertension  Restart BP meds from 12/09/2019 Hold valsartan, HCTZ, amlodipine, Coreg  CAD/hyperlipidemia Continue statins, Coreg  Heart block status post pacemaker Stable  OSA Continue CPAP  Mild cognitive impairment/?Dementia Continue Aricept          Malnutrition Type:      Malnutrition Characteristics:      Nutrition Interventions:       Estimated body mass index is 29.82 kg/m as calculated from the following:   Height as of 11/07/19: 5' 4"  (1.626 m).   Weight as of this encounter: 78.8 kg.     Code Status: DNR  Family Communication: Discussed with daughter on 12/08/2019  Disposition Plan: Likely CIR pending consultant signoff   Consultants:  Neurology  Procedures:  None  Antimicrobials:  None  DVT prophylaxis: Lovenox   Objective: Vitals:   12/08/19 0337 12/08/19 0751 12/08/19 1127 12/08/19 1610  BP: (!) 140/59 (!) 140/92 (!) 152/65 (!) 136/57  Pulse: 79 74 75 69  Resp: 17 15 18    Temp: 98.2 F (36.8 C) 98.1 F (36.7 C) 97.9 F (36.6 C) 98.6 F (37 C)  TempSrc: Oral Oral Axillary Oral  SpO2: 97% 98% 100% 98%  Weight:        Intake/Output Summary (Last 24 hours) at 12/08/2019 1654 Last data filed at 12/08/2019 1127 Gross per 24 hour  Intake 390 ml  Output 1750 ml  Net -1360 ml   Filed Weights   12/07/19 0200  Weight: 78.8 kg    Exam:  General: NAD   Cardiovascular: S1, S2 present  Respiratory: CTAB  Abdomen: Soft, nontender,  nondistended, bowel sounds present  Musculoskeletal: No bilateral pedal edema noted  Skin: Normal  Psychiatry: Normal mood  Neurology: Noted right facial droop, right upper/right lower extremity weakness about 3/5, 5/5 strength in left upper and lower extremity    Data Reviewed: CBC: Recent Labs  Lab 12/06/19 1650 12/07/19 0748 12/08/19 0348  WBC 6.2 6.8 6.5  NEUTROABS 3.3 2.8 2.7  HGB 11.0* 10.8* 10.2*  HCT 34.9* 34.9* 31.8*  MCV 91.6 93.1 89.6  PLT 237 199 675   Basic Metabolic Panel: Recent Labs  Lab 12/06/19 1650 12/07/19 0748 12/08/19 0348  NA 139 141 137  K 4.1 3.9 3.9  CL 104 103 103  CO2 25 25 25   GLUCOSE 183* 152* 168*  BUN 21 17 19   CREATININE 1.62* 1.24* 1.26*  CALCIUM 11.1* 10.7* 10.9*   GFR: Estimated Creatinine Clearance: 30.7 mL/min (A) (by C-G formula based on SCr of 1.26 mg/dL (H)). Liver Function Tests: Recent Labs  Lab 12/06/19 1650  AST 24  ALT 27  ALKPHOS 67  BILITOT 0.7  PROT 7.0  ALBUMIN 3.7   No results for input(s): LIPASE, AMYLASE in the last 168 hours. No results for input(s): AMMONIA in the last 168 hours. Coagulation Profile: Recent Labs  Lab 12/06/19 1650  INR 1.0   Cardiac Enzymes: No results for input(s): CKTOTAL, CKMB, CKMBINDEX, TROPONINI in the last 168 hours. BNP (last 3 results) No results for input(s): PROBNP in the last 8760 hours. HbA1C: Recent Labs    12/07/19 0209  HGBA1C 8.2*   CBG: Recent Labs  Lab 12/07/19 1647 12/07/19 2114 12/08/19 0616 12/08/19 1123 12/08/19 1607  GLUCAP 125* 129* 166* 158* 182*   Lipid Profile: Recent Labs    12/07/19 0209  CHOL 185  HDL 51  LDLCALC 119*  TRIG 74  CHOLHDL 3.6   Thyroid Function Tests: No results for input(s): TSH, T4TOTAL, FREET4, T3FREE, THYROIDAB in the last 72 hours. Anemia Panel: No results for input(s): VITAMINB12, FOLATE, FERRITIN, TIBC, IRON, RETICCTPCT in the last 72 hours. Urine analysis:    Component Value Date/Time    COLORURINE STRAW (A) 12/07/2019 2130   APPEARANCEUR CLEAR 12/07/2019 2130   LABSPEC 1.005 12/07/2019 2130   PHURINE 6.0 12/07/2019 2130   GLUCOSEU NEGATIVE 12/07/2019 2130   HGBUR NEGATIVE 12/07/2019 2130   BILIRUBINUR NEGATIVE 12/07/2019 2130   KETONESUR NEGATIVE 12/07/2019 2130   PROTEINUR NEGATIVE 12/07/2019 2130   UROBILINOGEN 0.2 08/07/2015 1826   NITRITE NEGATIVE 12/07/2019 2130   LEUKOCYTESUR MODERATE (A) 12/07/2019 2130   Sepsis Labs: @LABRCNTIP (procalcitonin:4,lacticidven:4)  ) Recent Results (from the past 240 hour(s))  SARS CORONAVIRUS 2 (TAT 6-24 HRS) Nasopharyngeal Nasopharyngeal Swab     Status: None   Collection Time: 12/06/19  9:56 PM   Specimen: Nasopharyngeal Swab  Result Value Ref Range Status   SARS Coronavirus 2 NEGATIVE NEGATIVE Final    Comment: (NOTE) SARS-CoV-2 target nucleic acids are NOT DETECTED. The SARS-CoV-2 RNA is generally detectable in upper and lower respiratory specimens during the acute phase of infection. Negative results do not preclude SARS-CoV-2 infection, do not rule out  co-infections with other pathogens, and should not be used as the sole basis for treatment or other patient management decisions. Negative results must be combined with clinical observations, patient history, and epidemiological information. The expected result is Negative. Fact Sheet for Patients: SugarRoll.be Fact Sheet for Healthcare Providers: https://www.woods-mathews.com/ This test is not yet approved or cleared by the [place] FDA and  has been authorized for detection and/or diagnosis of SARS-CoV-2 by FDA under an Emergency Use Authorization (EUA). This EUA will remain  in effect (meaning this test can be used) for the duration of the COVID-19 declaration under Section 56 4(b)(1) of the Act, 21 U.S.C. section 360bbb-3(b)(1), unless the authorization is terminated or revoked sooner. Performed at [facility], [place] [address] [place], [place] [address]       Studies: DG Knee 1-2 Views Left  Result Date: 12/07/2019 CLINICAL DATA:  Knee pain, bilateral EXAM: LEFT KNEE - 1-2 VIEW COMPARISON:  10/16/2012 FINDINGS: Tricompartmental osteoarthritic change with complete loss of the joint space in the medial compartment. No signs of acute fracture. Small joint effusion. IMPRESSION: Tricompartmental osteoarthritic change greatest in medial compartment with small joint effusion. Electronically Signed   By: Zetta Bills M.D.   On: 12/07/2019 20:10   DG Knee 1-2 Views Right  Result Date: 12/07/2019 CLINICAL DATA:  Bilateral knee plain select plain pain EXAM: RIGHT KNEE - 1-2 VIEW COMPARISON:  October 16, 2012 FINDINGS: Marked tricompartmental osteoarthritic change greatest in medial compartment with complete loss of the joint space and marginal osteophytes, worse than in 2013. Osteopenia. No acute fracture. Postoperative changes about the medial left thigh and posterior lower extremity may reflect changes of previous graft harvest. IMPRESSION: 1. Marked tricompartmental osteoarthritic change greatest in the medial compartment of the right knee, worse than in 2013. 2. Osteopenia. Electronically Signed   By: Zetta Bills M.D.   On: 12/07/2019 20:08   CT HEAD WO CONTRAST  Result Date: 12/08/2019 CLINICAL DATA:  Neuro deficit, stroke suspected EXAM: CT HEAD WITHOUT CONTRAST TECHNIQUE: Contiguous axial images were obtained from the base of the skull through the vertex without intravenous contrast. COMPARISON:  12/07/2019 FINDINGS: Brain: There is atrophy and chronic small vessel disease changes. No acute intracranial abnormality. Specifically, no hemorrhage, hydrocephalus, mass lesion, acute infarction, or significant intracranial injury. Vascular: No hyperdense vessel or unexpected calcification. Skull: No acute calvarial abnormality. Sinuses/Orbits: Bilateral ocular implants again noted, stable. No acute  findings. Other: None IMPRESSION: Atrophy, chronic microvascular disease. No acute intracranial abnormality. Electronically Signed   By: Rolm Baptise M.D.   On: 12/08/2019 10:19    Scheduled Meds: . atorvastatin  80 mg Oral q morning - 10a  . calcitRIOL  0.25 mcg Oral q morning - 10a  . clopidogrel  75 mg Oral Daily  . donepezil  10 mg Oral QHS  . enoxaparin (LOVENOX) injection  30 mg Subcutaneous Daily  . ferrous sulfate  325 mg Oral Q breakfast  . insulin aspart  0-15 Units Subcutaneous TID WC  . pantoprazole  40 mg Oral QAC breakfast  . sodium chloride flush  3 mL Intravenous Once    Continuous Infusions:   LOS: 2 days     Alma Friendly, MD [facility]  If 7PM-7AM, please contact night-coverage [email or web address] 12/08/2019, 4:54 PM

## 2019-12-08 NOTE — Evaluation (Addendum)
Clinical/Bedside Swallow Evaluation Patient Details  Name: [name] MRN: [id number] Date of Birth: 12-07-1927  Today's Date: 12/08/2019 Time: SLP Start Time (ACUTE ONLY): 0919 SLP Stop Time (ACUTE ONLY): 0937 SLP Time Calculation (min) (ACUTE ONLY): 18 min  Past Medical History:  Past Medical History:  Diagnosis Date  . Anemia   . Arthritis   . Celiac disease   . Chronic systolic CHF (congestive heart failure) ([place])   . Complete heart block ([place])   . Coronary artery disease   . Diabetes mellitus    INSULIN DEPENDENT  . GERD (gastroesophageal reflux disease)   . Glaucoma   . Headache(784.0)   . Heart disease   . Hypercholesterolemia   . Hypertension   . Iron deficiency anemia, unspecified   . Ischemic cardiomyopathy   . Memory difficulties 04/14/2019  . Shortness of breath   . Sleep apnea    uses cpap  . Stroke ([place])   . Unspecified constipation    Past Surgical History:  Past Surgical History:  Procedure Laterality Date  . BACK SURGERY    . BI-VENTRICULAR PACEMAKER INSERTION (CRT-P)  March 2014   [facility]  . BIV PACEMAKER GENERATOR CHANGEOUT N/A 06/29/2019   Procedure: BIV PACEMAKER GENERATOR CHANGEOUT;  Surgeon: Evans Lance, MD;  Location: [facility];  Service: Cardiovascular;  Laterality: N/A;  . CARDIAC CATHETERIZATION  09/02/2012  . CARDIAC SURGERY    . CORONARY ANGIOPLASTY WITH STENT PLACEMENT  09/02/2012   R[facility]  . PERCUTANEOUS CORONARY STENT INTERVENTION (PCI-S) N/A 09/02/2012   Procedure: PERCUTANEOUS CORONARY STENT INTERVENTION (PCI-S);  Surgeon: Clent Demark, MD;  Location: [facility] CATH LAB;  Service: Cardiovascular;  Laterality: N/A;  . PERMANENT PACEMAKER INSERTION N/A 11/29/2012   Procedure: PERMANENT PACEMAKER INSERTION;  Surgeon: Deboraha Sprang, MD;  [facility] C[facility] LAB;  Service: Cardiovascular;  Laterality: N/A;  . TEMPORARY PACEMAKER INSERTION N/A 11/28/2012   Procedure: TEMPORARY PACEMAKER INSERTION;  Surgeon: Clent Demark,  MD;  Location: [facility] CATH LAB;  Service: Cardiovascular;  Laterality: N/A;   HPI:  Pt is a 86 y.o. F with significant PMH of CHF, DM, CAD with pacemaker, HTN, prior stroke, who presents with several falls in the prior 24 hours and RLE weakness. CT negative for acute abnormality. Pt unable to receive MRI due to pacemaker. New CT ordered 2/18, not yet completed at time of this BSE   Assessment / Plan / Recommendation Clinical Impression   Patient seen for bedside swallowing evaluation. She presents with mild/moderate suspected oropharyngeal dysphagia in the setting of TIA vs CVA (work up continues). Recommend dysphagia 2 solids with thin liquids.   Patient encountered sitting upright in bed. Patient alert, but with limited verbalizations. Cranial nerve exam somewhat limited by patient's cognition. However, notable for right sided facial asymmetry and lingual deviation to the right upon protrusion. Patient seen with thin liquids via straw and cup sips: grossly adequate oral acceptance and AP transit, swallow initiation appeared to be timely. 2 instances of throat clearing immediately following the swallow, but no other overt s/sx aspiration. Pt with some mild belching after the swallow, concerning for possible esophageal involvement. Pt reports she has a history of GERD (though she is an unreliable historian). Pt seen with puree solids (applesauce): oral phase notable for moderately prolonged mastication, discoordination with bolus manipulation. Adequate oral clearance following the swallow. Pt seen with one bite of a banana: prolonged mastication and some difficulty with bolus cohesion,  But able to initiate the swallow  eventually, no overt s/sx aspiration. Patient also seen with a few bites of pancakes: severely prolonged mastication, difficulty with bolus cohesion, difficulty with AP transport, required liquid rinse and verbal cues to propel bolus and initiate the swallow. Mild residue following the swallow,  this cleared with additional liquid rinse. 1 instance of delayed cough (>20 seconds), unclear if related to PO.  Recommend dysphagia 2 solids/ thin liquids. Full supervision and assistance. Sit upright for PO intake, small bites/sips, alternate liquids and solids, frequent oral care. ST to follow acutely.  Note: patient being transported to have CT scan following this evaluation for work-up as she presents today with worsening facial asymmetry compared to yesterday.  Dysphagia, Unspecified    Aspiration Risk  Mild aspiration risk    Diet Recommendation Dysphagia 2 (Fine chop);Thin liquid   Medication Administration: Crushed with puree Supervision: Full supervision/cueing for compensatory strategies Compensations: Small sips/bites;Minimize environmental distractions;Slow rate;Lingual sweep for clearance of pocketing;Follow solids with liquid;Clear throat intermittently Postural Changes: Seated upright at 90 degrees    Other  Recommendations Recommended Consults: Consider GI evaluation Oral Care Recommendations: Oral care BID Other Recommendations: Have oral suction available   Follow up Recommendations 24 hour supervision/assistance;Skilled Nursing facility;Inpatient Rehab      Frequency and Duration min 2x/week  2 weeks       Prognosis Prognosis for Safe Diet Advancement: Fair Barriers to Reach Goals: Cognitive deficits      Swallow Study   General HPI: Pt is a 86 y.o. F with significant PMH of CHF, DM, CAD with pacemaker, HTN, prior stroke, who presents with several falls in the prior 24 hours and RLE weakness. CT negative for acute abnormality. Pt unable to receive MRI due to pacemaker. New CT ordered 2/18, not yet completed at time of this BSE Type of Study: Bedside Swallow Evaluation Diet Prior to this Study: Regular;Thin liquids Temperature Spikes Noted: No Respiratory Status: Room air History of Recent Intubation: No Behavior/Cognition: Alert;Requires cueing Oral  Cavity Assessment: Within Functional Limits Oral Cavity - Dentition: Adequate natural dentition;Missing dentition Vision: Functional for self-feeding Self-Feeding Abilities: Needs assist Patient Positioning: Upright in bed Baseline Vocal Quality: Wet;Low vocal intensity Volitional Cough: Weak Volitional Swallow: Unable to elicit    Oral/Motor/Sensory Function Overall Oral Motor/Sensory Function: Mild impairment Facial ROM: Suspected CN VII (facial) dysfunction Facial Symmetry: Abnormal symmetry right Facial Sensation: Other (Comment)(difficult to assess 2/2 cog deficits) Lingual ROM: Suspected CN XII (hypoglossal) dysfunction Lingual Symmetry: Abnormal symmetry right Velum: Within Functional Limits Mandible: Other (Comment)(patient w/ difficulty following commands)   Ice Chips Ice chips: Not tested   Thin Liquid Thin Liquid: Within functional limits Presentation: Self Fed;Cup;Straw    Nectar Thick Nectar Thick Liquid: Not tested   Honey Thick Honey Thick Liquid: Not tested   Puree Puree: Impaired Oral Phase Impairments: Reduced lingual movement/coordination;Impaired mastication Pharyngeal Phase Impairments: Suspected delayed Swallow   Solid     Solid: Impaired Presentation: Self Fed;Spoon Oral Phase Impairments: Reduced lingual movement/coordination;Poor awareness of bolus;Impaired mastication Oral Phase Functional Implications: Prolonged oral transit;Impaired mastication;Oral residue Pharyngeal Phase Impairments: Suspected delayed Swallow;Cough - Delayed      Bryann Gentz 12/08/2019,9:47 AM   Marina Goodell, M.Ed., CCC-SLP Speech Therapy Acute Rehabilitation [phone]: Acute Rehab office [phone] - pager

## 2019-12-09 LAB — CBC WITH DIFFERENTIAL/PLATELET
Abs Immature Granulocytes: 0.02 10*3/uL (ref 0.00–0.07)
Basophils Absolute: 0 10*3/uL (ref 0.0–0.1)
Basophils Relative: 0 %
Eosinophils Absolute: 0.1 10*3/uL (ref 0.0–0.5)
Eosinophils Relative: 1 %
HCT: 32.4 % — ABNORMAL LOW (ref 36.0–46.0)
Hemoglobin: 10.2 g/dL — ABNORMAL LOW (ref 12.0–15.0)
Immature Granulocytes: 0 %
Lymphocytes Relative: 35 %
Lymphs Abs: 2.4 10*3/uL (ref 0.7–4.0)
MCH: 29.1 pg (ref 26.0–34.0)
MCHC: 31.5 g/dL (ref 30.0–36.0)
MCV: 92.3 fL (ref 80.0–100.0)
Monocytes Absolute: 0.7 10*3/uL (ref 0.1–1.0)
Monocytes Relative: 10 %
Neutro Abs: 3.7 10*3/uL (ref 1.7–7.7)
Neutrophils Relative %: 54 %
Platelets: 225 10*3/uL (ref 150–400)
RBC: 3.51 MIL/uL — ABNORMAL LOW (ref 3.87–5.11)
RDW: 12.7 % (ref 11.5–15.5)
WBC: 7 10*3/uL (ref 4.0–10.5)
nRBC: 0 % (ref 0.0–0.2)

## 2019-12-09 LAB — GLUCOSE, CAPILLARY
Glucose-Capillary: 192 mg/dL — ABNORMAL HIGH (ref 70–99)
Glucose-Capillary: 168 mg/dL — ABNORMAL HIGH (ref 70–99)
Glucose-Capillary: 226 mg/dL — ABNORMAL HIGH (ref 70–99)
Glucose-Capillary: 252 mg/dL — ABNORMAL HIGH (ref 70–99)
Glucose-Capillary: 188 mg/dL — ABNORMAL HIGH (ref 70–99)

## 2019-12-09 LAB — BASIC METABOLIC PANEL
Anion gap: 9 (ref 5–15)
BUN: 19 mg/dL (ref 8–23)
CO2: 26 mmol/L (ref 22–32)
Calcium: 10.9 mg/dL — ABNORMAL HIGH (ref 8.9–10.3)
Chloride: 102 mmol/L (ref 98–111)
Creatinine, Ser: 1.38 mg/dL — ABNORMAL HIGH (ref 0.44–1.00)
GFR calc Af Amer: 39 mL/min — ABNORMAL LOW (ref >60)
GFR calc non Af Amer: 34 mL/min — ABNORMAL LOW (ref >60)
Glucose, Bld: 190 mg/dL — ABNORMAL HIGH (ref 70–99)
Potassium: 3.9 mmol/L (ref 3.5–5.1)
Sodium: 137 mmol/L (ref 135–145)

## 2019-12-09 MED ORDER — INSULIN GLARGINE 100 UNIT/ML ~~LOC~~ SOLN
10.0000 [IU] | Freq: Every day | SUBCUTANEOUS | Status: DC
  Administered 2019-12-09 – 2019-12-12 (×4): 10 [IU] via SUBCUTANEOUS
  Filled 2019-12-09 (×5): qty 0.1

## 2019-12-09 MED ORDER — AMLODIPINE BESYLATE 2.5 MG PO TABS
2.5000 mg | ORAL_TABLET | Freq: Every day | ORAL | Status: DC
  Administered 2019-12-09 – 2019-12-14 (×6): 2.5 mg via ORAL
  Filled 2019-12-09 (×6): qty 1

## 2019-12-09 MED ORDER — ASPIRIN EC 81 MG PO TBEC
81.0000 mg | DELAYED_RELEASE_TABLET | Freq: Every day | ORAL | Status: DC
  Administered 2019-12-09 – 2019-12-14 (×6): 81 mg via ORAL
  Filled 2019-12-09 (×6): qty 1

## 2019-12-09 NOTE — Progress Notes (Signed)
Physical Therapy Treatment Patient Details Name: [name] MRN: [id number] DOB: 02/21/1928 Today's Date: 12/09/2019    History of Present Illness Pt is a 86 y.o. F with significant PMH of CHF, DM, CAD with pacemaker, HTN, prior stroke, who presents with several falls in the prior 24 hours and RLE weakness. CT negative for acute abnormality. Pt unable to receive MRI due to pacemaker.     PT Comments    Pt performed out of bed to recliner with use of sara stedy. With B knees blocked in sara stedy frame she required decreased assistance to rise into standing.  Continue to recommend aggressive CIR therapies at this time.     Follow Up Recommendations  CIR     Equipment Recommendations  Wheelchair (measurements PT)    Recommendations for Other Services Rehab consult     Precautions / Restrictions Precautions Precautions: Fall;Other (comment) Precaution Comments: R knee buckling and scissoring. Restrictions Weight Bearing Restrictions: No    Mobility  Bed Mobility Overal bed mobility: Needs Assistance Bed Mobility: Rolling;Sidelying to Sit Rolling: Mod assist Sidelying to sit: Mod assist       General bed mobility comments: Pt required assistance to roll and for hand placement.   Pt required assistance to elevate trunk into sitting and advance LEs to edge of bed.  Poor balance noted sitting edge of bed with lateral lean to the right.  Transfers Overall transfer level: Needs assistance Equipment used: Ambulation equipment used(sara stedy) Transfers: Sit to/from Stand Sit to Stand: Mod assist         General transfer comment: Mod assistance to stand in sara stedy x 2 trilas.  Cues for hip extension and upper trunk control.  Hand over hand assistance on RUE to maintain grip.  Ambulation/Gait Ambulation/Gait assistance: (did not perform as she is not safe to ambulate without increased assistance.)               Stairs             Wheelchair  Mobility    Modified Rankin (Stroke Patients Only)       Balance Overall balance assessment: Needs assistance Sitting-balance support: Feet supported Sitting balance-Leahy Scale: Fair       Standing balance-Leahy Scale: Poor                              Cognition Arousal/Alertness: Awake/alert Behavior During Therapy: WFL for tasks assessed/performed Overall Cognitive Status: History of cognitive impairments - at baseline Area of Impairment: Memory;Safety/judgement;Awareness;Problem solving                     Memory: Decreased short-term memory   Safety/Judgement: Decreased awareness of safety;Decreased awareness of deficits   Problem Solving: Requires verbal cues General Comments: pt requiring cues and support for multistep tasks for safety      Exercises      General Comments        Pertinent Vitals/Pain Pain Assessment: No/denies pain    Home Living                      Prior Function            PT Goals (current goals can now be found in the care plan section) Acute Rehab PT Goals Patient Stated Goal: be able to take care of self again Potential to Achieve Goals: Good Progress towards PT goals: Progressing toward goals  Frequency    Min 3X/week      PT Plan Current plan remains appropriate    Co-evaluation              AM-PAC PT "6 Clicks" Mobility   Outcome Measure  Help needed turning from your back to your side while in a flat bed without using bedrails?: A Lot Help needed moving from lying on your back to sitting on the side of a flat bed without using bedrails?: A Lot Help needed moving to and from a bed to a chair (including a wheelchair)?: A Lot Help needed standing up from a chair using your arms (e.g., wheelchair or bedside chair)?: A Lot Help needed to walk in hospital room?: Total Help needed climbing 3-5 steps with a railing? : Total 6 Click Score: 10    End of Session Equipment  Utilized During Treatment: Gait belt Activity Tolerance: Patient tolerated treatment well Patient left: in chair;with call bell/phone within reach;with chair alarm set Nurse Communication: Mobility status PT Visit Diagnosis: Unsteadiness on feet (R26.81);History of falling (Z91.81);Difficulty in walking, not elsewhere classified (R26.2)     Time: 1209-1230 PT Time Calculation (min) (ACUTE ONLY): 21 min  Charges:  $Therapeutic Activity: 8-22 mins                     Erasmo Leventhal , PTA Acute Rehabilitation Services Pager [phone] Office [phone]     Dea Bitting Eli Hose 12/09/2019, 1:41 PM

## 2019-12-09 NOTE — Progress Notes (Signed)
SLP Cancellation Note  Patient Details Name: [name] MRN: [id number] DOB: 11/05/1927   Cancelled treatment:       Reason Eval/Treat Not Completed: Fatigue/lethargy limiting ability to participate ST attempted tx. Patient sleeping, CPAP in place. Will re-attempt later today as schedule allows.  Marina Goodell, M.Ed., CCC-SLP Speech Therapy Acute Rehabilitation [phone]: Acute Rehab office [phone] - pager    Marina Goodell 12/09/2019, 9:12 AM

## 2019-12-09 NOTE — Progress Notes (Signed)
  Speech Language Pathology Treatment: Dysphagia  Patient Details Name: [name] MRN: [id number] DOB: January 27, 1928 Today's Date: 12/09/2019 Time: 2094-7096 SLP Time Calculation (min) (ACUTE ONLY): 19 min  Assessment / Plan / Recommendation Clinical Impression  Patient encountered sitting upright in chair, lunch tray in room. Pt self fed bites of dysphagia 2 solids (broccoli, ground meat): pt with prolonged mastication, some difficulty w/ bolus formation. Pt expelled portion of bolus anteriorally. Pt edu re: taking smaller bites. With smaller bite size of dysphagia 2 solids, patient with moderately prolonged mastication, but adequate bolus formation and AP transport, swallow initiation appeared timely. No overt s/sx aspiration. Patient also seen with puree solids (magic cup): minimally prolonged oral phase, but no overt s/sx aspiration. Pt seen with ice chips and thin liquids via straw and cup sips: some mild difficulty labially grasping straw, but adequate oral acceptance, no overt s/sx aspiration. Minimal anterior labial spillage with cup sips that patient demonstrated awareness of.  ST trialed chopped peaches: pt with significantly prolonged mastication, difficulty with bolus cohesion and propulsion, but eventually AP transport and the swallow initiated. No overt s/sx aspiration. Recommend patient continue dysphagia 2 solids as she demonstrates difficulty masticating upgraded solid consistencies. ST will follow acutely to assess for diet tolerance and readiness to upgrade diet texture.    HPI HPI: Pt is a 86 y.o. F with significant PMH of CHF, DM, CAD with pacemaker, HTN, prior stroke, who presents with several falls in the prior 24 hours and RLE weakness. CT negative for acute abnormality. Pt unable to receive MRI due to pacemaker. New CT ordered 2/18, not yet completed at time of this BSE      SLP Plan  Continue with current plan of care       Recommendations  Diet recommendations:  Dysphagia 2 (fine chop);Thin liquid Liquids provided via: Cup;Straw Medication Administration: Crushed with puree Supervision: Full supervision/cueing for compensatory strategies;Staff to assist with self feeding Compensations: Minimize environmental distractions;Slow rate;Small sips/bites;Lingual sweep for clearance of pocketing Postural Changes and/or Swallow Maneuvers: Seated upright 90 degrees;Upright 30-60 min after meal                Oral Care Recommendations: Oral care BID Follow up Recommendations: 24 hour supervision/assistance;Skilled Nursing facility;Inpatient Rehab SLP Visit Diagnosis: Dysphagia, oral phase (R13.11) Plan: Continue with current plan of care       [place] 12/09/2019, 1:54 PM  Marina Goodell, M.Ed., [place] Speech Therapy Acute Rehabilitation [phone]: Acute Rehab office [phone] - pager

## 2019-12-09 NOTE — Progress Notes (Addendum)
Inpatient Rehabilitation Admissions Coordinator  I have left a message for pt's daughter, Maxwell Marion, to contact me to discuss her mother's rehab needs. I await her call back.  Danne Baxter, RN, MSN Rehab Admissions Coordinator [phone] 12/09/2019 2:06 PM   I spoke with pt's daughter and she prefers CIR. I will begin insurance approval and follow up on Monday.  Danne Baxter, RN, MSN Rehab Admissions Coordinator [phone] 12/09/2019 2:43 PM

## 2019-12-09 NOTE — Progress Notes (Addendum)
PROGRESS NOTE  [name] PNT:[id number] DOB: 23-Sep-1928 DOA: 12/06/2019 PCP: [name], MD  HPI/Recap of past 24 hours: HPI from Dr Felizardo Hoffmann is a 86 y.o. female with medical history significant for CAD, CVA on Plavix, heart block s/p pacemaker, hypertension, Type 2 diabetes, OSA on CPAP who presents for concerns of a fall. Patient was unable to recall much of her symptoms and there was no family at bedside. Patient states that she was walking with her cane and when she turned she fell onto the ground and hit her head.  Leg weakness.  She denies any dizziness or loss of consciousness.  Thinks she might have fell another time prior to this but could not recall the event. Per ED documentation, daughter brought patient and due to concerns of slurred speech and left-sided weakness. All symptoms resolved in the ED. In the ER, VSS. Labs showed creatinine elevated at 1.62 from a prior of 1.24.  CT head negative for any acute abnormalities. EDP discussed with neurology who recommends admission.    Today, patient still with right facial droop, with right upper and lower extremity weakness.  Patient denies any chest pain, shortness of breath, abdominal pain, nausea/vomiting, fever/chills.      Assessment/Plan: Principal Problem:   Weakness Active Problems:   Hypercholesterolemia   Hypertension   Type 2 diabetes mellitus (HCC)   Obstructive sleep apnea   Pacemaker   CKD (chronic kidney disease), stage II   History of CVA (cerebrovascular accident)   OSA (obstructive sleep apnea)   Chronic diastolic congestive heart failure (HCC)   Right-sided hemiparesis likely 2/2 CVA Presents with RLE weakness, slurred speech Initial CT head negative for any acute intracranial abnormality, repeat X 2 negative as well Unable to obtain MRI brain due to incompatible pacemaker Neurology on board, spoke to Dr. Leonie Man on 12/09/2019 about worsening right-sided weakness with right facial droop,  no further recommendation Echo showed EF of 50 to 55%, no regional wall motion abnormality, no mention of any cardiac thrombus A1c, 8.2, LDL 119 Continue Plavix, Lipitor, started on aspirin PT/OT/SLP Frequent neurochecks, telemetry  CKD stage IIIa Creatinine around baseline Daily BMP  Chronic normocytic anemia Hemoglobin at baseline Continue ferrous sulfate Daily CBC  Diabetes mellitus type 2 Uncontrolled A1c 8.2 SSI, started lantus, Accu-Cheks, hypoglycemic protocol Hold home insulin regimen, Tradjenta  Hypertension Allow for permissive hypertension  Restart home amlodipine today, gradually add on as BP permits Hold valsartan, HCTZ, Coreg  CAD/hyperlipidemia Continue statins, hold Coreg  Heart block status post pacemaker Stable  OSA Continue CPAP  Mild cognitive impairment/?Dementia Continue Aricept          Malnutrition Type:      Malnutrition Characteristics:      Nutrition Interventions:       Estimated body mass index is 29.82 kg/m as calculated from the following:   Height as of 11/07/19: 5' 4"  (1.626 m).   Weight as of this encounter: 78.8 kg.     Code Status: DNR  Family Communication: Discussed with daughter on 12/08/2019  Disposition Plan: Likely CIR    Consultants: Neurology  Procedures: None  Antimicrobials: None  DVT prophylaxis: Lovenox   Objective: Vitals:   12/09/19 0344 12/09/19 0926 12/09/19 1258 12/09/19 1646  BP: (!) 109/43 (!) 144/55 (!) 131/49 (!) 152/60  Pulse: 73 74 73 70  Resp: 18 18 (!) 22 20  Temp: 97.9 F (36.6 C) 97.9 F (36.6 C) (!) 97.3 F (36.3 C) 98.5 F (36.9 C)  TempSrc: Oral Oral Oral Oral  SpO2: 98% 97% 100% 99%  Weight:        Intake/Output Summary (Last 24 hours) at 12/09/2019 1657 Last data filed at 12/09/2019 1108 Gross per 24 hour  Intake 220 ml  Output 600 ml  Net -380 ml   Filed Weights   12/07/19 0200  Weight: 78.8 kg    Exam: General: NAD  Cardiovascular: S1,  S2 present Respiratory: CTAB Abdomen: Soft, nontender, nondistended, bowel sounds present Musculoskeletal: No bilateral pedal edema noted Skin: Normal Psychiatry: Normal mood Neurology: Noted right facial droop, right upper/lower extremity weakness about 2/5, 5/5 strength in left upper/lower extremity     Data Reviewed: CBC: Recent Labs  Lab 12/06/19 1650 12/07/19 0748 12/08/19 0348 12/09/19 0433  WBC 6.2 6.8 6.5 7.0  NEUTROABS 3.3 2.8 2.7 3.7  HGB 11.0* 10.8* 10.2* 10.2*  HCT 34.9* 34.9* 31.8* 32.4*  MCV 91.6 93.1 89.6 92.3  PLT 237 199 217 782   Basic Metabolic Panel: Recent Labs  Lab 12/06/19 1650 12/07/19 0748 12/08/19 0348 12/09/19 0433  NA 139 141 137 137  K 4.1 3.9 3.9 3.9  CL 104 103 103 102  CO2 25 25 25 26   GLUCOSE 183* 152* 168* 190*  BUN 21 17 19 19   CREATININE 1.62* 1.24* 1.26* 1.38*  CALCIUM 11.1* 10.7* 10.9* 10.9*   GFR: Estimated Creatinine Clearance: 28.1 mL/min (A) (by C-G formula based on SCr of 1.38 mg/dL (H)). Liver Function Tests: Recent Labs  Lab 12/06/19 1650  AST 24  ALT 27  ALKPHOS 67  BILITOT 0.7  PROT 7.0  ALBUMIN 3.7   No results for input(s): LIPASE, AMYLASE in the last 168 hours. No results for input(s): AMMONIA in the last 168 hours. Coagulation Profile: Recent Labs  Lab 12/06/19 1650  INR 1.0   Cardiac Enzymes: No results for input(s): CKTOTAL, CKMB, CKMBINDEX, TROPONINI in the last 168 hours. BNP (last 3 results) No results for input(s): PROBNP in the last 8760 hours. HbA1C: Recent Labs    12/07/19 0209  HGBA1C 8.2*   CBG: Recent Labs  Lab 12/08/19 2103 12/09/19 0642 12/09/19 0837 12/09/19 1258 12/09/19 1649  GLUCAP 199* 192* 168* 226* 252*   Lipid Profile: Recent Labs    12/07/19 0209  CHOL 185  HDL 51  LDLCALC 119*  TRIG 74  CHOLHDL 3.6   Thyroid Function Tests: No results for input(s): TSH, T4TOTAL, FREET4, T3FREE, THYROIDAB in the last 72 hours. Anemia Panel: No results for input(s):  VITAMINB12, FOLATE, FERRITIN, TIBC, IRON, RETICCTPCT in the last 72 hours. Urine analysis:    Component Value Date/Time   COLORURINE STRAW (A) 12/07/2019 2130   APPEARANCEUR CLEAR 12/07/2019 2130   LABSPEC 1.005 12/07/2019 2130   PHURINE 6.0 12/07/2019 2130   GLUCOSEU NEGATIVE 12/07/2019 2130   HGBUR NEGATIVE 12/07/2019 2130   BILIRUBINUR NEGATIVE 12/07/2019 2130   KETONESUR NEGATIVE 12/07/2019 2130   PROTEINUR NEGATIVE 12/07/2019 2130   UROBILINOGEN 0.2 08/07/2015 1826   NITRITE NEGATIVE 12/07/2019 2130   LEUKOCYTESUR MODERATE (A) 12/07/2019 2130   Sepsis Labs: @LABRCNTIP (procalcitonin:4,lacticidven:4)  ) Recent Results (from the past 240 hour(s))  SARS CORONAVIRUS 2 (TAT 6-24 HRS) Nasopharyngeal Nasopharyngeal Swab     Status: None   Collection Time: 12/06/19  9:56 PM   Specimen: Nasopharyngeal Swab  Result Value Ref Range Status   SARS Coronavirus 2 NEGATIVE NEGATIVE Final    Comment: (NOTE) SARS-CoV-2 target nucleic acids are NOT DETECTED. The SARS-CoV-2 RNA is generally detectable in upper and  lower respiratory specimens during the acute phase of infection. Negative results do not preclude SARS-CoV-2 infection, do not rule out co-infections with other pathogens, and should not be used as the sole basis for treatment or other patient management decisions. Negative results must be combined with clinical observations, patient history, and epidemiological information. The expected result is Negative. Fact Sheet for Patients: SugarRoll.be Fact Sheet for Healthcare Providers: https://www.woods-mathews.com/ This test is not yet approved or cleared by the [place] FDA and  has been authorized for detection and/or diagnosis of SARS-CoV-2 by FDA under an Emergency Use Authorization (EUA). This EUA will remain  in effect (meaning this test can be used) for the duration of the COVID-19 declaration under Section 56 4(b)(1) of the Act,  21 U.S.C. section 360bbb-3(b)(1), unless the authorization is terminated or revoked sooner. Performed at [facility], [place] [address] [place], [place] [address]   Culture, Urine     Status: Abnormal   Collection Time: 12/07/19  9:23 PM   Specimen: Urine, Random  Result Value Ref Range Status   Specimen Description URINE, RANDOM  Final   Special Requests   Final    NONE Performed at [facility], [place] [address], [place], [place] [address]    Culture MULTIPLE SPECIES PRESENT, SUGGEST RECOLLECTION (A)  Final   Report Status 12/08/2019 FINAL  Final      Studies: No results found.  Scheduled Meds:  aspirin EC  81 mg Oral Daily   atorvastatin  80 mg Oral q morning - 10a   calcitRIOL  0.25 mcg Oral q morning - 10a   clopidogrel  75 mg Oral Daily   donepezil  10 mg Oral QHS   enoxaparin (LOVENOX) injection  30 mg Subcutaneous Daily   ferrous sulfate  325 mg Oral Q breakfast   insulin aspart  0-15 Units Subcutaneous TID WC   pantoprazole  40 mg Oral QAC breakfast   sodium chloride flush  3 mL Intravenous Once    Continuous Infusions:    LOS: 3 days     Alma Friendly, MD [facility]  If 7PM-7AM, please contact night-coverage [email or web address] 12/09/2019, 4:57 PM

## 2019-12-10 LAB — GLUCOSE, CAPILLARY
Glucose-Capillary: 182 mg/dL — ABNORMAL HIGH (ref 70–99)
Glucose-Capillary: 251 mg/dL — ABNORMAL HIGH (ref 70–99)
Glucose-Capillary: 138 mg/dL — ABNORMAL HIGH (ref 70–99)
Glucose-Capillary: 260 mg/dL — ABNORMAL HIGH (ref 70–99)

## 2019-12-10 LAB — BASIC METABOLIC PANEL
Anion gap: 9 (ref 5–15)
BUN: 20 mg/dL (ref 8–23)
CO2: 28 mmol/L (ref 22–32)
Calcium: 11.5 mg/dL — ABNORMAL HIGH (ref 8.9–10.3)
Chloride: 102 mmol/L (ref 98–111)
Creatinine, Ser: 1.29 mg/dL — ABNORMAL HIGH (ref 0.44–1.00)
GFR calc Af Amer: 43 mL/min — ABNORMAL LOW (ref >60)
GFR calc non Af Amer: 37 mL/min — ABNORMAL LOW (ref >60)
Glucose, Bld: 182 mg/dL — ABNORMAL HIGH (ref 70–99)
Potassium: 4.5 mmol/L (ref 3.5–5.1)
Sodium: 139 mmol/L (ref 135–145)

## 2019-12-10 LAB — CBC WITH DIFFERENTIAL/PLATELET
Abs Immature Granulocytes: 0.02 10*3/uL (ref 0.00–0.07)
Basophils Absolute: 0 10*3/uL (ref 0.0–0.1)
Basophils Relative: 0 %
Eosinophils Absolute: 0.1 10*3/uL (ref 0.0–0.5)
Eosinophils Relative: 2 %
HCT: 34 % — ABNORMAL LOW (ref 36.0–46.0)
Hemoglobin: 10.7 g/dL — ABNORMAL LOW (ref 12.0–15.0)
Immature Granulocytes: 0 %
Lymphocytes Relative: 43 %
Lymphs Abs: 3.5 10*3/uL (ref 0.7–4.0)
MCH: 29 pg (ref 26.0–34.0)
MCHC: 31.5 g/dL (ref 30.0–36.0)
MCV: 92.1 fL (ref 80.0–100.0)
Monocytes Absolute: 0.7 10*3/uL (ref 0.1–1.0)
Monocytes Relative: 9 %
Neutro Abs: 3.7 10*3/uL (ref 1.7–7.7)
Neutrophils Relative %: 46 %
Platelets: 217 10*3/uL (ref 150–400)
RBC: 3.69 MIL/uL — ABNORMAL LOW (ref 3.87–5.11)
RDW: 12.5 % (ref 11.5–15.5)
WBC: 8.1 10*3/uL (ref 4.0–10.5)
nRBC: 0 % (ref 0.0–0.2)

## 2019-12-10 NOTE — Plan of Care (Signed)
  Problem: Clinical Measurements: Goal: Will remain free from infection Outcome: Progressing Goal: Diagnostic test results will improve Outcome: Progressing Goal: Respiratory complications will improve Outcome: Progressing Goal: Cardiovascular complication will be avoided Outcome: Progressing   Problem: Safety: Goal: Ability to remain free from injury will improve Outcome: Progressing Note: [name] has remained free of falls and injuries this shift.

## 2019-12-10 NOTE — Progress Notes (Signed)
PROGRESS NOTE  [name] DXA:[id number] DOB: 04-10-1928 DOA: 12/06/2019 PCP: [name], [name]  HPI/Recap of past 24 hours: HPI from Dr Felizardo Hoffmann is a 86 y.o. female with medical history significant for CAD, CVA on Plavix, heart block s/p pacemaker, hypertension, Type 2 diabetes, OSA on CPAP who presents for concerns of a fall. Patient was unable to recall much of her symptoms and there was no family at bedside. Patient states that she was walking with her cane and when she turned she fell onto the ground and hit her head.  Leg weakness.  She denies any dizziness or loss of consciousness.  Thinks she might have fell another time prior to this but could not recall the event. Per ED documentation, daughter brought patient and due to concerns of slurred speech and left-sided weakness. All symptoms resolved in the ED. In the ER, VSS. Labs showed creatinine elevated at 1.62 from a prior of 1.24.  CT head negative for any acute abnormalities. EDP discussed with neurology who recommends admission.    Today, patient still with right sided hemiplegia, denies any other new complaints.       Assessment/Plan: Principal Problem:   Weakness Active Problems:   Hypercholesterolemia   Hypertension   Type 2 diabetes mellitus (HCC)   Obstructive sleep apnea   Pacemaker   CKD (chronic kidney disease), stage II   History of CVA (cerebrovascular accident)   OSA (obstructive sleep apnea)   Chronic diastolic congestive heart failure (HCC)   Right-sided hemiparesis likely 2/2 CVA Presents with RLE weakness, slurred speech Initial CT head negative for any acute intracranial abnormality, repeat X 2 negative as well Unable to obtain MRI brain due to incompatible pacemaker Neurology on board, spoke to Dr. Leonie Man on 12/09/2019 about worsening right-sided weakness with right facial droop, no further recommendation Echo showed EF of 50 to 55%, no regional wall motion abnormality, no mention of  any cardiac thrombus A1c, 8.2, LDL 119 Continue Plavix, Lipitor, started on aspirin PT/OT/SLP Frequent neurochecks, telemetry  CKD stage IIIa/hx of hypocalcemia Creatinine around baseline Noted some mild hypercalcemia Will hold home calcitriol for now Daily BMP  Chronic normocytic anemia Hemoglobin at baseline Continue ferrous sulfate Daily CBC  Diabetes mellitus type 2 Uncontrolled A1c 8.2 SSI, started lantus, Accu-Cheks, hypoglycemic protocol Hold home insulin regimen, Tradjenta  Hypertension  Continue home amlodipine, gradually add on as BP permits Hold valsartan, HCTZ, Coreg  CAD/hyperlipidemia Continue statins, hold Coreg  Heart block status post pacemaker Stable  OSA Continue CPAP  Mild cognitive impairment/?Dementia Continue Aricept          Malnutrition Type:      Malnutrition Characteristics:      Nutrition Interventions:       Estimated body mass index is 29.82 kg/m as calculated from the following:   Height as of 11/07/19: 5' 4"  (1.626 m).   Weight as of this encounter: 78.8 kg.     Code Status: DNR  Family Communication: Discussed with daughter on 12/08/2019  Disposition Plan: Likely CIR    Consultants:  Neurology  Procedures:  None  Antimicrobials:  None  DVT prophylaxis: Lovenox   Objective: Vitals:   12/10/19 0409 12/10/19 0803 12/10/19 1151 12/10/19 1612  BP: (!) 151/71 (!) 161/70 133/68 (!) 134/56  Pulse: 73 71 75 76  Resp: 19 16 16 18   Temp: (!) 97.5 F (36.4 C) 97.7 F (36.5 C) 98.6 F (37 C) 98.8 F (37.1 C)  TempSrc: Oral Oral  SpO2: 100% 100% 100% 99%  Weight:        Intake/Output Summary (Last 24 hours) at 12/10/2019 1616 Last data filed at 12/10/2019 0160 Gross per 24 hour  Intake 270 ml  Output 200 ml  Net 70 ml   Filed Weights   12/07/19 0200  Weight: 78.8 kg    Exam:  General: NAD   Cardiovascular: S1, S2 present  Respiratory: CTAB  Abdomen: Soft, nontender,  nondistended, bowel sounds present  Musculoskeletal: No bilateral pedal edema noted  Skin: Normal  Psychiatry: Normal mood  Neurology: Noted right facial droop, right upper/lower extremity weakness about 2/5, 5/5 strength in left upper/lower extremity     Data Reviewed: CBC: Recent Labs  Lab 12/06/19 1650 12/07/19 0748 12/08/19 0348 12/09/19 0433 12/10/19 0341  WBC 6.2 6.8 6.5 7.0 8.1  NEUTROABS 3.3 2.8 2.7 3.7 3.7  HGB 11.0* 10.8* 10.2* 10.2* 10.7*  HCT 34.9* 34.9* 31.8* 32.4* 34.0*  MCV 91.6 93.1 89.6 92.3 92.1  PLT 237 199 217 225 109   Basic Metabolic Panel: Recent Labs  Lab 12/06/19 1650 12/07/19 0748 12/08/19 0348 12/09/19 0433 12/10/19 0341  NA 139 141 137 137 139  K 4.1 3.9 3.9 3.9 4.5  CL 104 103 103 102 102  CO2 25 25 25 26 28   GLUCOSE 183* 152* 168* 190* 182*  BUN 21 17 19 19 20   CREATININE 1.62* 1.24* 1.26* 1.38* 1.29*  CALCIUM 11.1* 10.7* 10.9* 10.9* 11.5*   GFR: Estimated Creatinine Clearance: 30 mL/min (A) (by C-G formula based on SCr of 1.29 mg/dL (H)). Liver Function Tests: Recent Labs  Lab 12/06/19 1650  AST 24  ALT 27  ALKPHOS 67  BILITOT 0.7  PROT 7.0  ALBUMIN 3.7   No results for input(s): LIPASE, AMYLASE in the last 168 hours. No results for input(s): AMMONIA in the last 168 hours. Coagulation Profile: Recent Labs  Lab 12/06/19 1650  INR 1.0   Cardiac Enzymes: No results for input(s): CKTOTAL, CKMB, CKMBINDEX, TROPONINI in the last 168 hours. BNP (last 3 results) No results for input(s): PROBNP in the last 8760 hours. HbA1C: No results for input(s): HGBA1C in the last 72 hours. CBG: Recent Labs  Lab 12/09/19 1649 12/09/19 2119 12/10/19 0606 12/10/19 1150 12/10/19 1612  GLUCAP 252* 188* 182* 251* 138*   Lipid Profile: No results for input(s): CHOL, HDL, LDLCALC, TRIG, CHOLHDL, LDLDIRECT in the last 72 hours. Thyroid Function Tests: No results for input(s): TSH, T4TOTAL, FREET4, T3FREE, THYROIDAB in the last 72  hours. Anemia Panel: No results for input(s): VITAMINB12, FOLATE, FERRITIN, TIBC, IRON, RETICCTPCT in the last 72 hours. Urine analysis:    Component Value Date/Time   COLORURINE STRAW (A) 12/07/2019 2130   APPEARANCEUR CLEAR 12/07/2019 2130   LABSPEC 1.005 12/07/2019 2130   PHURINE 6.0 12/07/2019 2130   GLUCOSEU NEGATIVE 12/07/2019 2130   HGBUR NEGATIVE 12/07/2019 2130   BILIRUBINUR NEGATIVE 12/07/2019 2130   KETONESUR NEGATIVE 12/07/2019 2130   PROTEINUR NEGATIVE 12/07/2019 2130   UROBILINOGEN 0.2 08/07/2015 1826   NITRITE NEGATIVE 12/07/2019 2130   LEUKOCYTESUR MODERATE (A) 12/07/2019 2130   Sepsis Labs: @LABRCNTIP (procalcitonin:4,lacticidven:4)  ) Recent Results (from the past 240 hour(s))  SARS CORONAVIRUS 2 (TAT 6-24 HRS) Nasopharyngeal Nasopharyngeal Swab     Status: None   Collection Time: 12/06/19  9:56 PM   Specimen: Nasopharyngeal Swab  Result Value Ref Range Status   SARS Coronavirus 2 NEGATIVE NEGATIVE Final    Comment: (NOTE) SARS-CoV-2 target nucleic acids are NOT DETECTED.  The SARS-CoV-2 RNA is generally detectable in upper and lower respiratory specimens during the acute phase of infection. Negative results do not preclude SARS-CoV-2 infection, do not rule out co-infections with other pathogens, and should not be used as the sole basis for treatment or other patient management decisions. Negative results must be combined with clinical observations, patient history, and epidemiological information. The expected result is Negative. Fact Sheet for Patients: SugarRoll.be Fact Sheet for Healthcare Providers: https://www.woods-mathews.com/ This test is not yet approved or cleared by the [place] FDA and  has been authorized for detection and/or diagnosis of SARS-CoV-2 by FDA under an Emergency Use Authorization (EUA). This EUA will remain  in effect (meaning this test can be used) for the duration of the COVID-19  declaration under Section 56 4(b)(1) of the Act, 21 U.S.C. section 360bbb-3(b)(1), unless the authorization is terminated or revoked sooner. Performed at [facility], [place] [address], [place], [place] [address]   Culture, Urine     Status: Abnormal   Collection Time: 12/07/19  9:23 PM   Specimen: Urine, Random  Result Value Ref Range Status   Specimen Description URINE, RANDOM  Final   Special Requests   Final    NONE Performed at [facility], [place] [address], [place], [place] [address]    Culture MULTIPLE SPECIES PRESENT, SUGGEST RECOLLECTION (A)  Final   Report Status 12/08/2019 FINAL  Final      Studies: No results found.  Scheduled Meds: . amLODipine  2.5 mg Oral Daily  . aspirin EC  81 mg Oral Daily  . atorvastatin  80 mg Oral q morning - 10a  . calcitRIOL  0.25 mcg Oral q morning - 10a  . clopidogrel  75 mg Oral Daily  . donepezil  10 mg Oral QHS  . enoxaparin (LOVENOX) injection  30 mg Subcutaneous Daily  . ferrous sulfate  325 mg Oral Q breakfast  . insulin aspart  0-15 Units Subcutaneous TID WC  . insulin glargine  10 Units Subcutaneous Daily  . pantoprazole  40 mg Oral QAC breakfast  . sodium chloride flush  3 mL Intravenous Once    Continuous Infusions:   LOS: 4 days     Vicki Friendly, [name] [facility]  If 7PM-7AM, please contact night-coverage [email or web address] 12/10/2019, 4:16 PM

## 2019-12-11 ENCOUNTER — Inpatient Hospital Stay (HOSPITAL_COMMUNITY): Payer: Medicare Other

## 2019-12-11 LAB — CBC WITH DIFFERENTIAL/PLATELET
Abs Immature Granulocytes: 0.03 10*3/uL (ref 0.00–0.07)
Basophils Absolute: 0 10*3/uL (ref 0.0–0.1)
Basophils Relative: 0 %
Eosinophils Absolute: 0.1 10*3/uL (ref 0.0–0.5)
Eosinophils Relative: 1 %
HCT: 32.9 % — ABNORMAL LOW (ref 36.0–46.0)
Hemoglobin: 10.4 g/dL — ABNORMAL LOW (ref 12.0–15.0)
Immature Granulocytes: 0 %
Lymphocytes Relative: 38 %
Lymphs Abs: 2.8 10*3/uL (ref 0.7–4.0)
MCH: 29.1 pg (ref 26.0–34.0)
MCHC: 31.6 g/dL (ref 30.0–36.0)
MCV: 91.9 fL (ref 80.0–100.0)
Monocytes Absolute: 0.7 10*3/uL (ref 0.1–1.0)
Monocytes Relative: 10 %
Neutro Abs: 3.6 10*3/uL (ref 1.7–7.7)
Neutrophils Relative %: 51 %
Platelets: 227 10*3/uL (ref 150–400)
RBC: 3.58 MIL/uL — ABNORMAL LOW (ref 3.87–5.11)
RDW: 12.6 % (ref 11.5–15.5)
WBC: 7.2 10*3/uL (ref 4.0–10.5)
nRBC: 0 % (ref 0.0–0.2)

## 2019-12-11 LAB — BASIC METABOLIC PANEL
Anion gap: 9 (ref 5–15)
BUN: 20 mg/dL (ref 8–23)
CO2: 28 mmol/L (ref 22–32)
Calcium: 11.1 mg/dL — ABNORMAL HIGH (ref 8.9–10.3)
Chloride: 103 mmol/L (ref 98–111)
Creatinine, Ser: 1.22 mg/dL — ABNORMAL HIGH (ref 0.44–1.00)
GFR calc Af Amer: 45 mL/min — ABNORMAL LOW (ref >60)
GFR calc non Af Amer: 39 mL/min — ABNORMAL LOW (ref >60)
Glucose, Bld: 183 mg/dL — ABNORMAL HIGH (ref 70–99)
Potassium: 4.2 mmol/L (ref 3.5–5.1)
Sodium: 140 mmol/L (ref 135–145)

## 2019-12-11 LAB — GLUCOSE, CAPILLARY
Glucose-Capillary: 177 mg/dL — ABNORMAL HIGH (ref 70–99)
Glucose-Capillary: 230 mg/dL — ABNORMAL HIGH (ref 70–99)
Glucose-Capillary: 213 mg/dL — ABNORMAL HIGH (ref 70–99)
Glucose-Capillary: 243 mg/dL — ABNORMAL HIGH (ref 70–99)

## 2019-12-11 MED ORDER — SENNOSIDES-DOCUSATE SODIUM 8.6-50 MG PO TABS
1.0000 | ORAL_TABLET | Freq: Two times a day (BID) | ORAL | Status: DC
  Administered 2019-12-11 – 2019-12-14 (×7): 1 via ORAL
  Filled 2019-12-11 (×6): qty 1

## 2019-12-11 MED ORDER — IPRATROPIUM-ALBUTEROL 0.5-2.5 (3) MG/3ML IN SOLN
3.0000 mL | Freq: Three times a day (TID) | RESPIRATORY_TRACT | Status: DC
  Administered 2019-12-11 (×2): 3 mL via RESPIRATORY_TRACT
  Filled 2019-12-11 (×2): qty 3

## 2019-12-11 MED ORDER — POLYETHYLENE GLYCOL 3350 17 G PO PACK
17.0000 g | PACK | Freq: Every day | ORAL | Status: DC
  Administered 2019-12-11: 17 g via ORAL
  Filled 2019-12-11: qty 1

## 2019-12-11 NOTE — Plan of Care (Signed)

## 2019-12-11 NOTE — Progress Notes (Signed)
PROGRESS NOTE  [name]:[id number] DOB: 02-Nov-1927 DOA: 12/06/2019 PCP: [name], MD  HPI/Recap of past 24 hours: HPI from Dr [name] is a 86 y.o. female with medical history significant for CAD, CVA on Plavix, heart block s/p pacemaker, hypertension, Type 2 diabetes, OSA on CPAP who presents for concerns of a fall. Patient was unable to recall much of her symptoms and there was no family at bedside. Patient states that she was walking with her cane and when she turned she fell onto the ground and hit her head.  Leg weakness.  She denies any dizziness or loss of consciousness.  Thinks she might have fell another time prior to this but could not recall the event. Per ED documentation, daughter brought patient and due to concerns of slurred speech and left-sided weakness. All symptoms resolved in the ED. In the ER, VSS. Labs showed creatinine elevated at 1.62 from a prior of 1.24.  CT head negative for any acute abnormalities. EDP discussed with neurology who recommends admission.     Today, patient still with right-sided hemiplegia, right facial droop, denies any new complaints.  No BM for the past couple of days.       Assessment/Plan: Principal Problem:   Weakness Active Problems:   Hypercholesterolemia   Hypertension   Type 2 diabetes mellitus (HCC)   Obstructive sleep apnea   Pacemaker   CKD (chronic kidney disease), stage II   History of CVA (cerebrovascular accident)   OSA (obstructive sleep apnea)   Chronic diastolic congestive heart failure (HCC)   Right-sided hemiparesis likely 2/2 CVA Presents with RLE weakness, slurred speech Initial CT head negative for any acute intracranial abnormality, repeat X 2 negative as well Unable to obtain MRI brain due to incompatible pacemaker Neurology on board, spoke to Dr. Leonie Man on 12/09/2019 about worsening right-sided weakness with right facial droop, no further recommendation Echo showed EF of 50 to 55%,  no regional wall motion abnormality, no mention of any cardiac thrombus A1c, 8.2, LDL 119 Continue Plavix, Lipitor, started on aspirin PT/OT/SLP Frequent neurochecks, telemetry  CKD stage IIIa/hx of hypocalcemia Creatinine around baseline Noted some mild hypercalcemia Will hold home calcitriol for now Daily BMP  Chronic normocytic anemia Hemoglobin at baseline Continue ferrous sulfate Daily CBC  Diabetes mellitus type 2 Uncontrolled A1c 8.2 SSI, started lantus, Accu-Cheks, hypoglycemic protocol Hold home insulin regimen, Tradjenta  Hypertension  Continue home amlodipine, gradually add on as BP permits Hold valsartan, HCTZ, Coreg  CAD/hyperlipidemia Continue statins, hold Coreg  Heart block status post pacemaker Stable  OSA Continue CPAP  Mild cognitive impairment/?Dementia Continue Aricept          Malnutrition Type:      Malnutrition Characteristics:      Nutrition Interventions:       Estimated body mass index is 29.82 kg/m as calculated from the following:   Height as of 11/07/19: 5' 4"  (1.626 m).   Weight as of this encounter: 78.8 kg.     Code Status: DNR  Family Communication: Discussed with daughter on 12/08/2019  Disposition Plan: Likely CIR, awaiting placement   Consultants:  Neurology  Procedures:  None  Antimicrobials:  None  DVT prophylaxis: Lovenox   Objective: Vitals:   12/10/19 2001 12/10/19 2331 12/11/19 0436 12/11/19 0750  BP: (!) 143/48 (!) 121/43 (!) 133/57 136/61  Pulse: 70 70 75 70  Resp: 16 18 16 18   Temp: 98.3 F (36.8 C) 98.9 F (37.2 C) 98.6 F (37  C) (!) 97.5 F (36.4 C)  TempSrc: Oral Oral Oral Oral  SpO2: 99% 97% 100% 98%  Weight:        Intake/Output Summary (Last 24 hours) at 12/11/2019 1114 Last data filed at 12/11/2019 0912 Gross per 24 hour  Intake 240 ml  Output 1250 ml  Net -1010 ml   Filed Weights   12/07/19 0200  Weight: 78.8 kg    Exam:  General: NAD    Cardiovascular: S1, S2 present  Respiratory: CTAB  Abdomen: Soft, nontender, nondistended, bowel sounds present  Musculoskeletal: No bilateral pedal edema noted  Skin: Normal  Psychiatry: Normal mood  Neurology: Noted right facial droop, right upper/lower extremity weakness about 1/5, 5/5 strength in left upper/lower extremities     Data Reviewed: CBC: Recent Labs  Lab 12/07/19 0748 12/08/19 0348 12/09/19 0433 12/10/19 0341 12/11/19 0346  WBC 6.8 6.5 7.0 8.1 7.2  NEUTROABS 2.8 2.7 3.7 3.7 3.6  HGB 10.8* 10.2* 10.2* 10.7* 10.4*  HCT 34.9* 31.8* 32.4* 34.0* 32.9*  MCV 93.1 89.6 92.3 92.1 91.9  PLT 199 217 225 217 163   Basic Metabolic Panel: Recent Labs  Lab 12/07/19 0748 12/08/19 0348 12/09/19 0433 12/10/19 0341 12/11/19 0346  NA 141 137 137 139 140  K 3.9 3.9 3.9 4.5 4.2  CL 103 103 102 102 103  CO2 25 25 26 28 28   GLUCOSE 152* 168* 190* 182* 183*  BUN 17 19 19 20 20   CREATININE 1.24* 1.26* 1.38* 1.29* 1.22*  CALCIUM 10.7* 10.9* 10.9* 11.5* 11.1*   GFR: Estimated Creatinine Clearance: 31.7 mL/min (A) (by C-G formula based on SCr of 1.22 mg/dL (H)). Liver Function Tests: Recent Labs  Lab 12/06/19 1650  AST 24  ALT 27  ALKPHOS 67  BILITOT 0.7  PROT 7.0  ALBUMIN 3.7   No results for input(s): LIPASE, AMYLASE in the last 168 hours. No results for input(s): AMMONIA in the last 168 hours. Coagulation Profile: Recent Labs  Lab 12/06/19 1650  INR 1.0   Cardiac Enzymes: No results for input(s): CKTOTAL, CKMB, CKMBINDEX, TROPONINI in the last 168 hours. BNP (last 3 results) No results for input(s): PROBNP in the last 8760 hours. HbA1C: No results for input(s): HGBA1C in the last 72 hours. CBG: Recent Labs  Lab 12/10/19 0606 12/10/19 1150 12/10/19 1612 12/10/19 2052 12/11/19 0641  GLUCAP 182* 251* 138* 260* 177*   Lipid Profile: No results for input(s): CHOL, HDL, LDLCALC, TRIG, CHOLHDL, LDLDIRECT in the last 72 hours. Thyroid Function  Tests: No results for input(s): TSH, T4TOTAL, FREET4, T3FREE, THYROIDAB in the last 72 hours. Anemia Panel: No results for input(s): VITAMINB12, FOLATE, FERRITIN, TIBC, IRON, RETICCTPCT in the last 72 hours. Urine analysis:    Component Value Date/Time   COLORURINE STRAW (A) 12/07/2019 2130   APPEARANCEUR CLEAR 12/07/2019 2130   LABSPEC 1.005 12/07/2019 2130   PHURINE 6.0 12/07/2019 2130   GLUCOSEU NEGATIVE 12/07/2019 2130   HGBUR NEGATIVE 12/07/2019 2130   BILIRUBINUR NEGATIVE 12/07/2019 2130   KETONESUR NEGATIVE 12/07/2019 2130   PROTEINUR NEGATIVE 12/07/2019 2130   UROBILINOGEN 0.2 08/07/2015 1826   NITRITE NEGATIVE 12/07/2019 2130   LEUKOCYTESUR MODERATE (A) 12/07/2019 2130   Sepsis Labs: @LABRCNTIP (procalcitonin:4,lacticidven:4)  ) Recent Results (from the past 240 hour(s))  SARS CORONAVIRUS 2 (TAT 6-24 HRS) Nasopharyngeal Nasopharyngeal Swab     Status: None   Collection Time: 12/06/19  9:56 PM   Specimen: Nasopharyngeal Swab  Result Value Ref Range Status   SARS Coronavirus 2 NEGATIVE NEGATIVE  Final    Comment: (NOTE) SARS-CoV-2 target nucleic acids are NOT DETECTED. The SARS-CoV-2 RNA is generally detectable in upper and lower respiratory specimens during the acute phase of infection. Negative results do not preclude SARS-CoV-2 infection, do not rule out co-infections with other pathogens, and should not be used as the sole basis for treatment or other patient management decisions. Negative results must be combined with clinical observations, patient history, and epidemiological information. The expected result is Negative. Fact Sheet for Patients: SugarRoll.be Fact Sheet for Healthcare Providers: https://www.woods-mathews.com/ This test is not yet approved or cleared by the [place] FDA and  has been authorized for detection and/or diagnosis of SARS-CoV-2 by FDA under an Emergency Use Authorization (EUA). This EUA  will remain  in effect (meaning this test can be used) for the duration of the COVID-19 declaration under Section 56 4(b)(1) of the Act, 21 U.S.C. section 360bbb-3(b)(1), unless the authorization is terminated or revoked sooner. Performed at [facility], [place] [address], [place], [place] [address]   Culture, Urine     Status: Abnormal   Collection Time: 12/07/19  9:23 PM   Specimen: Urine, Random  Result Value Ref Range Status   Specimen Description URINE, RANDOM  Final   Special Requests   Final    NONE Performed at [facility], [place] [address] [place], [place] [address]    Culture MULTIPLE SPECIES PRESENT, SUGGEST RECOLLECTION (A)  Final   Report Status 12/08/2019 FINAL  Final      Studies: No results found.  Scheduled Meds: . amLODipine  2.5 mg Oral Daily  . aspirin EC  81 mg Oral Daily  . atorvastatin  80 mg Oral q morning - 10a  . clopidogrel  75 mg Oral Daily  . donepezil  10 mg Oral QHS  . enoxaparin (LOVENOX) injection  30 mg Subcutaneous Daily  . ferrous sulfate  325 mg Oral Q breakfast  . insulin aspart  0-15 Units Subcutaneous TID WC  . insulin glargine  10 Units Subcutaneous Daily  . pantoprazole  40 mg Oral QAC breakfast  . sodium chloride flush  3 mL Intravenous Once    Continuous Infusions:   LOS: 5 days     Alma Friendly, MD [facility]  If 7PM-7AM, please contact night-coverage [email or web address] 12/11/2019, 11:14 AM

## 2019-12-12 LAB — URINALYSIS, ROUTINE W REFLEX MICROSCOPIC
Bilirubin Urine: NEGATIVE
Glucose, UA: NEGATIVE mg/dL
Hgb urine dipstick: NEGATIVE
Ketones, ur: NEGATIVE mg/dL
Nitrite: NEGATIVE
Protein, ur: NEGATIVE mg/dL
Specific Gravity, Urine: 1.015 (ref 1.005–1.030)
pH: 6 (ref 5.0–8.0)

## 2019-12-12 LAB — BASIC METABOLIC PANEL
Anion gap: 12 (ref 5–15)
BUN: 24 mg/dL — ABNORMAL HIGH (ref 8–23)
CO2: 25 mmol/L (ref 22–32)
Calcium: 11.8 mg/dL — ABNORMAL HIGH (ref 8.9–10.3)
Chloride: 100 mmol/L (ref 98–111)
Creatinine, Ser: 1.42 mg/dL — ABNORMAL HIGH (ref 0.44–1.00)
GFR calc Af Amer: 38 mL/min — ABNORMAL LOW (ref >60)
GFR calc non Af Amer: 33 mL/min — ABNORMAL LOW (ref >60)
Glucose, Bld: 246 mg/dL — ABNORMAL HIGH (ref 70–99)
Potassium: 4.5 mmol/L (ref 3.5–5.1)
Sodium: 137 mmol/L (ref 135–145)

## 2019-12-12 LAB — CBC WITH DIFFERENTIAL/PLATELET
Abs Immature Granulocytes: 0.04 10*3/uL (ref 0.00–0.07)
Basophils Absolute: 0 10*3/uL (ref 0.0–0.1)
Basophils Relative: 0 %
Eosinophils Absolute: 0 10*3/uL (ref 0.0–0.5)
Eosinophils Relative: 0 %
HCT: 34.4 % — ABNORMAL LOW (ref 36.0–46.0)
Hemoglobin: 10.8 g/dL — ABNORMAL LOW (ref 12.0–15.0)
Immature Granulocytes: 0 %
Lymphocytes Relative: 13 %
Lymphs Abs: 1.6 10*3/uL (ref 0.7–4.0)
MCH: 29.1 pg (ref 26.0–34.0)
MCHC: 31.4 g/dL (ref 30.0–36.0)
MCV: 92.7 fL (ref 80.0–100.0)
Monocytes Absolute: 0.8 10*3/uL (ref 0.1–1.0)
Monocytes Relative: 7 %
Neutro Abs: 9.8 10*3/uL — ABNORMAL HIGH (ref 1.7–7.7)
Neutrophils Relative %: 80 %
Platelets: 245 10*3/uL (ref 150–400)
RBC: 3.71 MIL/uL — ABNORMAL LOW (ref 3.87–5.11)
RDW: 12.5 % (ref 11.5–15.5)
WBC: 12.3 10*3/uL — ABNORMAL HIGH (ref 4.0–10.5)
nRBC: 0 % (ref 0.0–0.2)

## 2019-12-12 LAB — GLUCOSE, CAPILLARY
Glucose-Capillary: 193 mg/dL — ABNORMAL HIGH (ref 70–99)
Glucose-Capillary: 202 mg/dL — ABNORMAL HIGH (ref 70–99)
Glucose-Capillary: 220 mg/dL — ABNORMAL HIGH (ref 70–99)

## 2019-12-12 LAB — URINALYSIS, MICROSCOPIC (REFLEX): RBC / HPF: NONE SEEN RBC/hpf (ref 0–5)

## 2019-12-12 MED ORDER — POLYETHYLENE GLYCOL 3350 17 G PO PACK
17.0000 g | PACK | Freq: Two times a day (BID) | ORAL | Status: DC
  Administered 2019-12-12 – 2019-12-14 (×5): 17 g via ORAL
  Filled 2019-12-12 (×5): qty 1

## 2019-12-12 MED ORDER — CARVEDILOL 3.125 MG PO TABS
3.1250 mg | ORAL_TABLET | Freq: Two times a day (BID) | ORAL | Status: DC
  Administered 2019-12-12 – 2019-12-14 (×4): 3.125 mg via ORAL
  Filled 2019-12-12 (×4): qty 1

## 2019-12-12 MED ORDER — INSULIN ASPART 100 UNIT/ML ~~LOC~~ SOLN
3.0000 [IU] | Freq: Three times a day (TID) | SUBCUTANEOUS | Status: DC
  Administered 2019-12-12 – 2019-12-14 (×4): 3 [IU] via SUBCUTANEOUS

## 2019-12-12 MED ORDER — IPRATROPIUM-ALBUTEROL 0.5-2.5 (3) MG/3ML IN SOLN: 3.0000 mL | Freq: Four times a day (QID) | RESPIRATORY_TRACT | Status: DC | PRN

## 2019-12-12 NOTE — Progress Notes (Signed)
Occupational Therapy Treatment Patient Details Name: [name] MRN: [id number] DOB: 1928/10/10 Today's Date: 12/12/2019    History of present illness Pt is a 86 y.o. F with significant PMH of CHF, DM, CAD with pacemaker, HTN, prior stroke, who presents with several falls in the prior 24 hours and RLE weakness. CT negative for acute abnormality. Pt unable to receive MRI due to pacemaker.    OT comments  Patient is making minimal progress towards goals in skilled OT session. Patient's session encompassed co treat with PT in order to address goals of functional mobility and ADLs safely. Pt noted to have a decline in overall function, as patient is now requiring Stedy and 2 assist in order to promote standing and improved upper body coordination. Pt attempted toileting on BSC, requiring max A +2 for stand pivot, then utilizing stedy for remainder of session. Pt incontinent of bladder once transferred to chair, requiring max to total A to transfer back to [facility] and to complete peri-care and lower body bathing. Pt required max verbal and tactile cues to remain upright posture (often attempting slump forward) and tactile cues at RUE in order to maintain grip on stedy. Pt to be seen by OTR next session to reassess overall status, will continue to follow acutely.    Follow Up Recommendations  CIR;Supervision/Assistance - 24 hour    Equipment Recommendations  None recommended by OT    Recommendations for Other Services      Precautions / Restrictions Precautions Precautions: Fall Restrictions Weight Bearing Restrictions: No       Mobility Bed Mobility Overal bed mobility: Needs Assistance Bed Mobility: Rolling;Sidelying to Sit Rolling: Mod assist Sidelying to sit: Mod assist       General bed mobility comments: Mod A +2 in order to get to EOB, improved sitting balance noted  Transfers Overall transfer level: Needs assistance Equipment used: Ambulation equipment used(Sara  Stedy) Transfers: Sit to/from Stand Sit to Stand: Max assist;Total assist         General transfer comment: max to total A in sara stedy x4 trials, cues for hip extension, and trunk control, improved ability to maintain grip with RUE, however required increased cues as pt fatigued    Balance Overall balance assessment: Needs assistance Sitting-balance support: Feet supported Sitting balance-Leahy Scale: Fair Sitting balance - Comments: cannot tolerate challenge   Standing balance support: Bilateral upper extremity supported;During functional activity Standing balance-Leahy Scale: Zero Standing balance comment: Pt with poor balance in inital stands, as trials continued zero balance noted                           ADL either performed or assessed with clinical judgement   ADL Overall ADL's : Needs assistance/impaired Eating/Feeding: Maximal assistance;With adaptive utensils Eating/Feeding Details (indicate cue type and reason): Set-up for eating, able to find adaptive fork (built up) however unable to load or bring to mouth         Lower Body Bathing: Total assistance;+2 for safety/equipment Lower Body Bathing Details (indicate cue type and reason): Completed in sitting, pt unable to bathe feet due to decreased balance     Lower Body Dressing: Sitting/lateral leans;Total assistance Lower Body Dressing Details (indicate cue type and reason): put unable to maintain balance to reach down to doff socks in sitting Toilet Transfer: +2 for physical assistance;Maximal assistance;Total assistance;Cueing for safety;Cueing for sequencing;BSC Toilet Transfer Details (indicate cue type and reason): Used stedy to transfer 2x to [facility],  max to total A of 2 as session progressed due to fatigue Toileting- Clothing Manipulation and Hygiene: Cueing for safety;Cueing for sequencing;+2 for physical assistance;Total assistance Toileting - Clothing Manipulation Details (indicate cue type and  reason): Total A for peri care in session, pt unable to maintain balance in stting     Functional mobility during ADLs: Maximal assistance;Total assistance;+2 for physical assistance;+2 for safety/equipment;Cueing for sequencing;Cueing for safety General ADL Comments: Pt now requring stedy, plus 2 assist in order to complete ADLs, pt was noted to get progressively weaker as session continued     Vision Baseline Vision/History: No visual deficits     Perception     Praxis      Cognition Arousal/Alertness: Awake/alert Behavior During Therapy: WFL for tasks assessed/performed Overall Cognitive Status: History of cognitive impairments - at baseline Area of Impairment: Memory;Safety/judgement;Awareness;Problem solving                     Memory: Decreased short-term memory   Safety/Judgement: Decreased awareness of safety;Decreased awareness of deficits Awareness: Emergent Problem Solving: Requires verbal cues General Comments: pt requiring cues and support for multistep tasks for safety        Exercises     Shoulder Instructions       General Comments      Pertinent Vitals/ Pain          Home Living                                          Prior Functioning/Environment              Frequency  Min 2X/week        Progress Toward Goals  OT Goals(current goals can now be found in the care plan section)  Progress towards OT goals: Not progressing toward goals - comment(Pt noted to be declining in comparison to OT eval)  Acute Rehab OT Goals Patient Stated Goal: be able to take care of self again OT Goal Formulation: With patient Time For Goal Achievement: 12/21/19 Potential to Achieve Goals: Good  Plan Discharge plan remains appropriate    Co-evaluation    PT/OT/SLP Co-Evaluation/Treatment: Yes Reason for Co-Treatment: For patient/therapist safety;To address functional/ADL transfers   OT goals addressed during session: ADL's  and self-care;Strengthening/ROM      AM-PAC OT "6 Clicks" Daily Activity     Outcome Measure   Help from another person eating meals?: A Lot Help from another person taking care of personal grooming?: A Lot Help from another person toileting, which includes using toliet, bedpan, or urinal?: Total Help from another person bathing (including washing, rinsing, drying)?: Total Help from another person to put on and taking off regular upper body clothing?: Total Help from another person to put on and taking off regular lower body clothing?: Total 6 Click Score: 8    End of Session Equipment Utilized During Treatment: Other (comment);Gait belt(Sara Stedy)  OT Visit Diagnosis: Unsteadiness on feet (R26.81);Other abnormalities of gait and mobility (R26.89);Muscle weakness (generalized) (M62.81);Other symptoms and signs involving cognitive function   Activity Tolerance Patient limited by fatigue;Patient limited by lethargy   Patient Left in chair;with call bell/phone within reach;with chair alarm set   Nurse Communication Mobility status        Time: 7564-3329 OT Time Calculation (min): 30 min  Charges: OT General Charges $OT Visit: 1 Visit OT Treatments $Self Care/Home  Management : 8-22 mins  Corinne Ports E. Kinslee Dalpe, COTA/L Acute Rehabilitation Services [place] 12/12/2019, 3:17 PM

## 2019-12-12 NOTE — Progress Notes (Signed)
PROGRESS NOTE  [name] AJG:[id number] DOB: 1928/08/16 DOA: 12/06/2019 PCP: [name], [name]  HPI/Recap of past 24 hours: HPI from Dr Felizardo Hoffmann is a 86 y.o. female with medical history significant for CAD, CVA on Plavix, heart block s/p pacemaker, hypertension, Type 2 diabetes, OSA on CPAP who presents for concerns of a fall. Patient was unable to recall much of her symptoms and there was no family at bedside. Patient states that she was walking with her cane and when she turned she fell onto the ground and hit her head.  Leg weakness.  She denies any dizziness or loss of consciousness.  Thinks she might have fell another time prior to this but could not recall the event. Per ED documentation, daughter brought patient and due to concerns of slurred speech and left-sided weakness. All symptoms resolved in the ED. In the ER, VSS. Labs showed creatinine elevated at 1.62 from a prior of 1.24.  CT head negative for any acute abnormalities. EDP discussed with neurology who recommends admission.     Today, pt still with right-sided hemiplegia, right facial droop improving.  Met patient feeding herself breakfast, noted to be coughing.  Patient denies any new complaints.  Bladder scan showed about 400 cc of urine.     Assessment/Plan: Principal Problem:   Weakness Active Problems:   Hypercholesterolemia   Hypertension   Type 2 diabetes mellitus (HCC)   Obstructive sleep apnea   Pacemaker   CKD (chronic kidney disease), stage II   History of CVA (cerebrovascular accident)   OSA (obstructive sleep apnea)   Chronic diastolic congestive heart failure (HCC)   Right-sided hemiparesis likely 2/2 CVA Presents with RLE weakness, slurred speech Initial CT head negative for any acute intracranial abnormality, repeat X 2 negative as well Unable to obtain MRI brain due to incompatible pacemaker Neurology on board, spoke to Dr. Leonie Man on 12/09/2019 about worsening right-sided weakness  with right facial droop, no further recommendation Echo showed EF of 50 to 55%, no regional wall motion abnormality, no mention of any cardiac thrombus A1c, 8.2, LDL 119 Continue Plavix, Lipitor, started on aspirin PT/OT/SLP Frequent neurochecks, telemetry  CKD stage IIIa/hx of hypocalcemia Creatinine around baseline Noted some mild hypercalcemia Will hold home calcitriol for now Daily BMP  Chronic normocytic anemia Hemoglobin at baseline Continue ferrous sulfate Daily CBC  Diabetes mellitus type 2 Uncontrolled A1c 8.2 SSI, started lantus, Accu-Cheks, hypoglycemic protocol Hold home insulin regimen, Tradjenta  Hypertension  Continue home amlodipine, coreg, gradually add on as BP permits Hold valsartan, HCTZ  CAD/hyperlipidemia Continue statins, Coreg  Heart block status post pacemaker Stable  OSA Continue CPAP  Mild cognitive impairment/?Dementia Continue Aricept          Malnutrition Type:      Malnutrition Characteristics:      Nutrition Interventions:       Estimated body mass index is 29.82 kg/m as calculated from the following:   Height as of 11/07/19: 5' 4"  (1.626 m).   Weight as of this encounter: 78.8 kg.     Code Status: DNR  Family Communication: Discussed with daughter on 12/11/2019  Disposition Plan: Likely CIR, awaiting placement   Consultants:  Neurology  Procedures:  None  Antimicrobials:  None  DVT prophylaxis: Lovenox   Objective: Vitals:   12/12/19 0312 12/12/19 0807 12/12/19 1231 12/12/19 1513  BP: 121/70 (!) 139/56 (!) 170/65 (!) 141/58  Pulse: 69 78 75 71  Resp: 18 15 15 16   Temp: 99  F (37.2 C) 98.2 F (36.8 C) 98.1 F (36.7 C) 99.3 F (37.4 C)  TempSrc: Axillary  Oral   SpO2: 99% 93% 97% 100%  Weight:        Intake/Output Summary (Last 24 hours) at 12/12/2019 1602 Last data filed at 12/12/2019 1159 Gross per 24 hour  Intake --  Output 1400 ml  Net -1400 ml   Filed Weights   12/07/19  0200  Weight: 78.8 kg    Exam:  General: NAD   Cardiovascular: S1, S2 present  Respiratory: CTAB  Abdomen: Soft, nontender, nondistended, bowel sounds present  Musculoskeletal: No bilateral pedal edema noted  Skin: Normal  Psychiatry: Normal mood  Neurology: Noted right facial droop, right upper/lower extremity weakness about 1/5, 5/5 strength in left upper/lower extremities     Data Reviewed: CBC: Recent Labs  Lab 12/08/19 0348 12/09/19 0433 12/10/19 0341 12/11/19 0346 12/12/19 0309  WBC 6.5 7.0 8.1 7.2 12.3*  NEUTROABS 2.7 3.7 3.7 3.6 9.8*  HGB 10.2* 10.2* 10.7* 10.4* 10.8*  HCT 31.8* 32.4* 34.0* 32.9* 34.4*  MCV 89.6 92.3 92.1 91.9 92.7  PLT 217 225 217 227 419   Basic Metabolic Panel: Recent Labs  Lab 12/08/19 0348 12/09/19 0433 12/10/19 0341 12/11/19 0346 12/12/19 0309  NA 137 137 139 140 137  K 3.9 3.9 4.5 4.2 4.5  CL 103 102 102 103 100  CO2 25 26 28 28 25   GLUCOSE 168* 190* 182* 183* 246*  BUN 19 19 20 20  24*  CREATININE 1.26* 1.38* 1.29* 1.22* 1.42*  CALCIUM 10.9* 10.9* 11.5* 11.1* 11.8*   GFR: Estimated Creatinine Clearance: 27.3 mL/min (A) (by C-G formula based on SCr of 1.42 mg/dL (H)). Liver Function Tests: Recent Labs  Lab 12/06/19 1650  AST 24  ALT 27  ALKPHOS 67  BILITOT 0.7  PROT 7.0  ALBUMIN 3.7   No results for input(s): LIPASE, AMYLASE in the last 168 hours. No results for input(s): AMMONIA in the last 168 hours. Coagulation Profile: Recent Labs  Lab 12/06/19 1650  INR 1.0   Cardiac Enzymes: No results for input(s): CKTOTAL, CKMB, CKMBINDEX, TROPONINI in the last 168 hours. BNP (last 3 results) No results for input(s): PROBNP in the last 8760 hours. HbA1C: No results for input(s): HGBA1C in the last 72 hours. CBG: Recent Labs  Lab 12/11/19 1608 12/11/19 2118 12/12/19 0608 12/12/19 1230 12/12/19 1539  GLUCAP 213* 243* 193* 202* 220*   Lipid Profile: No results for input(s): CHOL, HDL, LDLCALC, TRIG,  CHOLHDL, LDLDIRECT in the last 72 hours. Thyroid Function Tests: No results for input(s): TSH, T4TOTAL, FREET4, T3FREE, THYROIDAB in the last 72 hours. Anemia Panel: No results for input(s): VITAMINB12, FOLATE, FERRITIN, TIBC, IRON, RETICCTPCT in the last 72 hours. Urine analysis:    Component Value Date/Time   COLORURINE YELLOW 12/12/2019 1030   APPEARANCEUR HAZY (A) 12/12/2019 1030   LABSPEC 1.015 12/12/2019 1030   PHURINE 6.0 12/12/2019 1030   GLUCOSEU NEGATIVE 12/12/2019 1030   HGBUR NEGATIVE 12/12/2019 1030   BILIRUBINUR NEGATIVE 12/12/2019 1030   KETONESUR NEGATIVE 12/12/2019 1030   PROTEINUR NEGATIVE 12/12/2019 1030   UROBILINOGEN 0.2 08/07/2015 1826   NITRITE NEGATIVE 12/12/2019 1030   LEUKOCYTESUR SMALL (A) 12/12/2019 1030   Sepsis Labs: @LABRCNTIP (procalcitonin:4,lacticidven:4)  ) Recent Results (from the past 240 hour(s))  SARS CORONAVIRUS 2 (TAT 6-24 HRS) Nasopharyngeal Nasopharyngeal Swab     Status: None   Collection Time: 12/06/19  9:56 PM   Specimen: Nasopharyngeal Swab  Result Value Ref Range  Status   SARS Coronavirus 2 NEGATIVE NEGATIVE Final    Comment: (NOTE) SARS-CoV-2 target nucleic acids are NOT DETECTED. The SARS-CoV-2 RNA is generally detectable in upper and lower respiratory specimens during the acute phase of infection. Negative results do not preclude SARS-CoV-2 infection, do not rule out co-infections with other pathogens, and should not be used as the sole basis for treatment or other patient management decisions. Negative results must be combined with clinical observations, patient history, and epidemiological information. The expected result is Negative. Fact Sheet for Patients: SugarRoll.be Fact Sheet for Healthcare Providers: https://www.woods-mathews.com/ This test is not yet approved or cleared by the [place] FDA and  has been authorized for detection and/or diagnosis of SARS-CoV-2 by FDA  under an Emergency Use Authorization (EUA). This EUA will remain  in effect (meaning this test can be used) for the duration of the COVID-19 declaration under Section 56 4(b)(1) of the Act, 21 U.S.C. section 360bbb-3(b)(1), unless the authorization is terminated or revoked sooner. Performed at [facility], [place] [address] [place], [place] [address]   Culture, Urine     Status: Abnormal   Collection Time: 12/07/19  9:23 PM   Specimen: Urine, Random  Result Value Ref Range Status   Specimen Description URINE, RANDOM  Final   Special Requests   Final    NONE Performed at [facility], [place] [address], [place], [place] [address]    Culture MULTIPLE SPECIES PRESENT, SUGGEST RECOLLECTION (A)  Final   Report Status 12/08/2019 FINAL  Final      Studies: DG Chest Port 1 View  Result Date: 12/11/2019 CLINICAL DATA:  86 year old female with history of trauma from a fall. EXAM: PORTABLE CHEST 1 VIEW COMPARISON:  Chest x-ray 12/07/2019. FINDINGS: Lung volumes are low. No consolidative airspace disease. No pleural effusions. No pneumothorax. No pulmonary nodule or mass noted. Pulmonary vasculature and the cardiomediastinal silhouette are within normal limits. Aortic atherosclerosis. Status post median sternotomy for CABG. Left-sided pacemaker device in place with lead tips projecting over the expected location of the right atrium and right ventricle. IMPRESSION: 1. Low lung volumes without radiographic evidence of acute cardiopulmonary disease. 2. Aortic atherosclerosis. 3. Postoperative changes and support apparatus, as above. Electronically Signed   By: Vinnie Langton M.D.   On: 12/11/2019 17:22    Scheduled Meds: . amLODipine  2.5 mg Oral Daily  . aspirin EC  81 mg Oral Daily  . atorvastatin  80 mg Oral q morning - 10a  . clopidogrel  75 mg Oral Daily  . donepezil  10 mg Oral QHS  . enoxaparin (LOVENOX) injection  30 mg Subcutaneous Daily  . ferrous sulfate  325 mg Oral Q  breakfast  . insulin aspart  0-15 Units Subcutaneous TID WC  . insulin aspart  3 Units Subcutaneous TID WC  . insulin glargine  10 Units Subcutaneous Daily  . pantoprazole  40 mg Oral QAC breakfast  . polyethylene glycol  17 g Oral BID  . senna-docusate  1 tablet Oral BID  . sodium chloride flush  3 mL Intravenous Once    Continuous Infusions:   LOS: 6 days     Vicki Friendly, [name] [facility]  If 7PM-7AM, please contact night-coverage [email or web address] 12/12/2019, 4:02 PM

## 2019-12-12 NOTE — Progress Notes (Signed)
Inpatient Diabetes Program Recommendations  AACE/ADA: New Consensus Statement on Inpatient Glycemic Control (2015)  Target Ranges:  Prepandial:   less than 140 mg/dL      Peak postprandial:   less than 180 mg/dL (1-2 hours)      Critically ill patients:  140 - 180 mg/dL   Lab Results  Component Value Date   GLUCAP 193 (H) 12/12/2019   HGBA1C 8.2 (H) 12/07/2019    Review of Glycemic Control  Results for HAYDEE, JABBOUR (MRN [id number]) as of 12/12/2019 12:24  Ref. Range 12/11/2019 06:41 12/11/2019 11:28 12/11/2019 16:08 12/11/2019 21:18 12/12/2019 06:08  Glucose-Capillary Latest Ref Range: 70 - 99 mg/dL 177 (H) 230 (H) 213 (H) 243 (H) 193 (H)    Diabetes history: DM2 Outpatient Diabetes medications: Tresiba 10-20 units QHS + Humalog 6 units TID + Trajenta 5 mg QD Current orders for Inpatient glycemic control: Novolog 0-15 units TID + Lantus 10 units QD  Inpatient Diabetes Program Recommendations:     Noted fasting blood glucose has been 182/177/193  -Please consider adding Novolog 3 units TID with meals if eats at least 50%  Thank you, Reche Dixon, RN, BSN Diabetes Coordinator Inpatient Diabetes Program [phone] (team pager from 8a-5p)

## 2019-12-12 NOTE — H&P (Signed)
Physical Medicine and Rehabilitation Admission H&P    Chief Complaint  Patient presents with  . Weakness  : HPI: [name] is an 86 year old right-handed female with history of CAD with stenting, CKD stage III with creatinine 1.56, CVA maintained on Plavix, memory loss maintained on Aricept followed by Dr. Jannifer Franklin, heart block with pacemaker, hypertension, type 2 diabetes mellitus, OSA with CPAP, chronic normocytic anemia, chronic diastolic congestive heart failure.  History taken from chart review due to cognition.  Patient lives with family.  1 level home.  Used a cane prior to admission for mobility and daughter assist with some basic ADLs.  She presented on 12/07/2019 after fall without LOC.  She was noted to have slurred speech and left hemiparesis.  Cranial CT scan unremarkable for acute intracranial process.  Patient did not receive TPA.  MRI not completed due to pacemaker.  Echocardiogram with ejection fraction of 55% without emboli.  Neurology follow-up currently maintained on aspirin and Plavix for CVA prophylaxis subcutaneous Lovenox for DVT prophylaxis.  Admission chemistries with creatinine 1.62, SARS coronavirus negative.  Hospital course further complicated by postop dysphagia, on a dysphagia 2 thin liquid diet.  Urinalysis study 12/12/2019 greater than 100,000 Citrobacter placed on Merrem 12/13/2019 for UTI.  Therapy evaluations completed and patient was admitted for a comprehensive rehab program.  Please see preadmission assessment from earlier today as well.  Review of Systems  Unable to perform ROS: Mental acuity   Past Medical History:  Diagnosis Date  . Anemia   . Arthritis   . Celiac disease   . Chronic systolic CHF (congestive heart failure) ([place])   . Complete heart block ([place])   . Coronary artery disease   . Diabetes mellitus    INSULIN DEPENDENT  . GERD (gastroesophageal reflux disease)   . Glaucoma   . Headache(784.0)   . Heart disease   .  Hypercholesterolemia   . Hypertension   . Iron deficiency anemia, unspecified   . Ischemic cardiomyopathy   . Memory difficulties 04/14/2019  . Shortness of breath   . Sleep apnea    uses cpap  . Stroke ([place])   . Unspecified constipation    Past Surgical History:  Procedure Laterality Date  . BACK SURGERY    . BI-VENTRICULAR PACEMAKER INSERTION (CRT-P)  March 2014   [facility]  . BIV PACEMAKER GENERATOR CHANGEOUT N/A 06/29/2019   Procedure: BIV PACEMAKER GENERATOR CHANGEOUT;  Surgeon: Evans Lance, MD;  Location: [facility];  Service: Cardiovascular;  Laterality: N/A;  . CARDIAC CATHETERIZATION  09/02/2012  . CARDIAC SURGERY    . CORONARY ANGIOPLASTY WITH STENT PLACEMENT  09/02/2012   RCA  . PERCUTANEOUS CORONARY STENT INTERVENTION (PCI-S) N/A 09/02/2012   Procedure: PERCUTANEOUS CORONARY STENT INTERVENTION (PCI-S);  Surgeon: Clent Demark, MD;  Location: [facility] CATH LAB;  Service: Cardiovascular;  Laterality: N/A;  . PERMANENT PACEMAKER INSERTION N/A 11/29/2012   Procedure: PERMANENT PACEMAKER INSERTION;  Surgeon: Deboraha Sprang, MD;  Location: [facility] CATH LAB;  Service: Cardiovascular;  Laterality: N/A;  . TEMPORARY PACEMAKER INSERTION N/A 11/28/2012   Procedure: TEMPORARY PACEMAKER INSERTION;  Surgeon: Clent Demark, MD;  Location: [facility] CATH LAB;  Service: Cardiovascular;  Laterality: N/A;   Family History  Problem Relation Age of Onset  . Diabetes Mother   . Hypertension Mother   . Hyperlipidemia Mother   . Cancer Sister   . Dementia Sister   . Neuropathy Sister    Social History:  reports that  she quit smoking about 42 years ago. She has never used smokeless tobacco. She reports that she does not drink alcohol or use drugs. Allergies: No Known Allergies Medications Prior to Admission  Medication Sig Dispense Refill  . amLODipine (NORVASC) 2.5 MG tablet Take 2.5 mg by mouth every morning.    Marland Kitchen atorvastatin (LIPITOR) 80 MG tablet Take 80 mg by mouth every morning.    5  . brimonidine-timolol (COMBIGAN) 0.2-0.5 % ophthalmic solution Place 1 drop into both eyes 2 (two) times daily.    . calcitRIOL (ROCALTROL) 0.25 MCG capsule Take 0.25 mcg by mouth every morning.    . carvedilol (COREG) 3.125 MG tablet Take 3.125 mg by mouth 2 (two) times daily.  3  . clopidogrel (PLAVIX) 75 MG tablet Take 1 tablet (75 mg total) by mouth daily. 30 tablet 0  . Cod Liver Oil CAPS Take 1 capsule by mouth at bedtime.     . donepezil (ARICEPT) 10 MG tablet Take 10 mg by mouth at bedtime.     . dorzolamide (TRUSOPT) 2 % ophthalmic solution Place 1 drop into both eyes 2 (two) times daily.     . ferrous sulfate 325 (65 FE) MG tablet Take 325 mg by mouth daily with breakfast.    . insulin degludec (TRESIBA FLEXTOUCH) 100 UNIT/ML SOPN FlexTouch Pen Inject 10-20 Units into the skin See admin instructions. Inject 10-20 units into the skin at bedtime, based upon CGB (home capillary blood glucose)    . Insulin Lispro (HUMALOG KWIKPEN) 200 UNIT/ML SOPN Inject 6 Units into the skin 3 (three) times daily as needed (high blood sugar).     Marland Kitchen latanoprost (XALATAN) 0.005 % ophthalmic solution Appointment OVERDUE, place 1 drop into both eyes daily at bedtime (Patient taking differently: Place 1 drop into both eyes at bedtime. ) 2.5 mL 0  . linagliptin (TRADJENTA) 5 MG TABS tablet Take 5 mg by mouth every morning.    . loperamide (IMODIUM) 2 MG capsule Take 2 mg by mouth as needed for diarrhea or loose stools.    . Multiple Vitamin (MULTIVITAMIN WITH MINERALS) TABS Take 1 tablet by mouth at bedtime.     . Multiple Vitamins-Minerals (EYE VITAMINS) CAPS Take 1 capsule by mouth every morning.    . nitroGLYCERIN (NITROSTAT) 0.4 MG SL tablet Place 0.4 mg under the tongue every 5 (five) minutes as needed for chest pain.     . pantoprazole (PROTONIX) 40 MG tablet Take 40 mg by mouth daily before breakfast.     . valsartan-hydrochlorothiazide (DIOVAN-HCT) 320-12.5 MG tablet Take 1 tablet by mouth every  morning.     Marland Kitchen NOVOFINE 32G X 6 MM MISC       Drug Regimen Review Drug regimen was reviewed and remains appropriate with no significant issues identified.  Home: Home Living Family/patient expects to be discharged to:: Inpatient rehab Living Arrangements: Other (Comment)(granddaughter; daughter next door) Available Help at Discharge: Family Type of Home: House Home Access: Level entry Home Layout: One level Bathroom Shower/Tub: Chiropodist: Standard Home Equipment: Radio producer - single point, Bedside commode, Shower seat  Lives With: Son, Daughter   Functional History: Prior Function Level of Independence: Needs assistance Gait / Transfers Assistance Needed: pt reports use of SPC for mobility ADL's / Homemaking Assistance Needed: pt's daughter assists with bathing Comments: aide M-F 8 hours a day (? pt difficulty reporting time) helps with BADL  Functional Status:  Mobility: Bed Mobility Overal bed mobility: Needs Assistance Bed Mobility: Rolling, Sidelying  to Sit Rolling: Mod assist, +2 for physical assistance Sidelying to sit: Mod assist, +2 for physical assistance Supine to sit: Mod assist General bed mobility comments: Pt required assistance to flex R knee into hooklying and push to roll into sidelying.  PTA provided Hand over Hand assistance with RUE.  HOB elevated to push into sitting.  Pt is slow and guarded with poor sitting balance.  She required total assistance to scoot hips forward. Transfers Overall transfer level: Needs assistance Equipment used: Ambulation equipment used, None(sara stedy) Transfer via Lift Equipment: Stedy Transfers: Sit to/from Stand, Risk manager Sit to Stand: Max assist, Total assist, +2 physical assistance Stand pivot transfers: Total assist, +2 physical assistance General transfer comment: Pt seated edge of bed on arrival and needed to toilet  She presents with decreased strength and poor balance and required total  assistance to move from bed  to bed side commode.  Pt then utilized sara stedy for remainder of transfers this session.  She required increased assistance on her R side to protect her RUE and maintain balance.  Strong R lateral lean noted  in sitting on sara stedy and in standing she presented with R forward lean with excessive hip flexion. Ambulation/Gait Ambulation/Gait assistance: (pt remains to decline functionally and gt remains unsafe at this time.) Gait Distance (Feet): 4 Feet(attempted steps from bed to recliner and she was increasingly unsteady with noted scissoring of RLE past midline.  She was able to take steps to recliner but then required recliner to be brought to her bottom as she became increasingly unsteady.) Assistive device: Rolling walker (2 wheeled) Gait Pattern/deviations: Step-through pattern, Decreased stride length, Trunk flexed, Scissoring General Gait Details: Pt with increased trunk flexion,  and strong R lateral lean.  She was unable to complete steps to recliner before requiring chair to be pulled up to her bottom. Gait velocity: decreased    ADL: ADL Overall ADL's : Needs assistance/impaired Eating/Feeding: Maximal assistance, With adaptive utensils Eating/Feeding Details (indicate cue type and reason): Set-up for eating, able to find adaptive fork (built up) however unable to load or bring to mouth Grooming: Set up, Sitting Grooming Details (indicate cue type and reason): attempted to stand, pt could not sustain with R knee buckle and needing to sit Upper Body Bathing: Minimal assistance, Sitting Lower Body Bathing: Total assistance, +2 for safety/equipment Lower Body Bathing Details (indicate cue type and reason): Completed in sitting, pt unable to bathe feet due to decreased balance Upper Body Dressing : Minimal assistance, Sitting Lower Body Dressing: Sitting/lateral leans, Total assistance Lower Body Dressing Details (indicate cue type and reason): put unable  to maintain balance to reach down to doff socks in sitting Toilet Transfer: +2 for physical assistance, Maximal assistance, Total assistance, Cueing for safety, Cueing for sequencing, BSC Toilet Transfer Details (indicate cue type and reason): Used stedy to transfer 2x to BSC, max to total A of 2 as session progressed due to fatigue Toileting- Clothing Manipulation and Hygiene: Cueing for safety, Cueing for sequencing, +2 for physical assistance, Total assistance Toileting - Clothing Manipulation Details (indicate cue type and reason): Total A for peri care in session, pt unable to maintain balance in stting Functional mobility during ADLs: Maximal assistance, Total assistance, +2 for physical assistance, +2 for safety/equipment, Cueing for sequencing, Cueing for safety General ADL Comments: Pt now requring stedy, plus 2 assist in order to complete ADLs, pt was noted to get progressively weaker as session continued  Cognition: Cognition Overall Cognitive Status: History  of cognitive impairments - at baseline Arousal/Alertness: Awake/alert Orientation Level: Oriented to person, Oriented to place, Disoriented to time, Oriented to situation Attention: Sustained Sustained Attention: Appears intact Memory: Impaired Memory Impairment: Prospective memory, Decreased short term memory Decreased Short Term Memory: Verbal basic Cognition Arousal/Alertness: Awake/alert Behavior During Therapy: WFL for tasks assessed/performed Overall Cognitive Status: History of cognitive impairments - at baseline Area of Impairment: Memory, Safety/judgement, Awareness, Problem solving Memory: Decreased short-term memory Safety/Judgement: Decreased awareness of safety, Decreased awareness of deficits Awareness: Emergent Problem Solving: Requires verbal cues General Comments: pt requiring cues and support for multistep tasks for safety  Physical Exam: Blood pressure 132/64, pulse 72, temperature 98.7 F (37.1 C),  temperature source Oral, resp. rate 18, height 5' 7"  (1.702 m), weight 78.8 kg, SpO2 93 %. Physical Exam  Vitals reviewed. Constitutional: She appears well-developed.  Obese  HENT:  Head: Normocephalic and atraumatic.  Eyes: Right eye exhibits no discharge. Left eye exhibits no discharge.  ?  Left gaze preference  Neck: No tracheal deviation present. No thyromegaly present.  Respiratory: Effort normal. No stridor. No respiratory distress.  GI: Soft. She exhibits no distension.  Musculoskeletal:     Comments: No edema or tenderness in extremities  Neurological: She is alert.  Follows simple commands with some inconsistency. Oriented to person and place Right facial weakness  Motor: RUE/RLE: 0/5 proximal distal No increase in tone appreciated LUE/LLE: Appears to be 5/5 proximal distal Sensation appears to be intact light touch Dysarthria Aphasia  Skin: Skin is warm and dry.  Psychiatric: Her affect is blunt. Her speech is delayed and slurred. She is slowed.    Results for orders placed or performed during the hospital encounter of 12/06/19 (from the past 48 hour(s))  Urinalysis, Routine w reflex microscopic     Status: Abnormal   Collection Time: 12/12/19 10:30 AM  Result Value Ref Range   Color, Urine YELLOW YELLOW   APPearance HAZY (A) CLEAR   Specific Gravity, Urine 1.015 1.005 - 1.030   pH 6.0 5.0 - 8.0   Glucose, UA NEGATIVE NEGATIVE mg/dL   Hgb urine dipstick NEGATIVE NEGATIVE   Bilirubin Urine NEGATIVE NEGATIVE   Ketones, ur NEGATIVE NEGATIVE mg/dL   Protein, ur NEGATIVE NEGATIVE mg/dL   Nitrite NEGATIVE NEGATIVE   Leukocytes,Ua SMALL (A) NEGATIVE    Comment: Performed at [facility] [address], [place], [place] [address]  Urinalysis, Microscopic (reflex)     Status: Abnormal   Collection Time: 12/12/19 10:30 AM  Result Value Ref Range   RBC / HPF NONE SEEN 0 - 5 RBC/hpf   WBC, UA 0-5 0 - 5 WBC/hpf   Bacteria, UA FEW (A) NONE SEEN   Squamous  Epithelial / LPF 0-5 0 - 5   Ca Oxalate Crys, UA PRESENT     Comment: Performed at [facility] [address], [place], [place] [address]  Glucose, capillary     Status: Abnormal   Collection Time: 12/12/19 12:30 PM  Result Value Ref Range   Glucose-Capillary 202 (H) 70 - 99 mg/dL  Glucose, capillary     Status: Abnormal   Collection Time: 12/12/19  3:39 PM  Result Value Ref Range   Glucose-Capillary 220 (H) 70 - 99 mg/dL  CBC with Differential/Platelet     Status: Abnormal   Collection Time: 12/13/19  3:38 AM  Result Value Ref Range   WBC 12.6 (H) 4.0 - 10.5 K/uL   RBC 3.51 (L) 3.87 - 5.11 MIL/uL  Hemoglobin 10.1 (L) 12.0 - 15.0 g/dL   HCT 32.2 (L) 36.0 - 46.0 %   MCV 91.7 80.0 - 100.0 fL   MCH 28.8 26.0 - 34.0 pg   MCHC 31.4 30.0 - 36.0 g/dL   RDW 12.7 11.5 - 15.5 %   Platelets 214 150 - 400 K/uL   nRBC 0.0 0.0 - 0.2 %   Neutrophils Relative % 72 %   Neutro Abs 9.1 (H) 1.7 - 7.7 K/uL   Lymphocytes Relative 18 %   Lymphs Abs 2.3 0.7 - 4.0 K/uL   Monocytes Relative 9 %   Monocytes Absolute 1.1 (H) 0.1 - 1.0 K/uL   Eosinophils Relative 1 %   Eosinophils Absolute 0.1 0.0 - 0.5 K/uL   Basophils Relative 0 %   Basophils Absolute 0.1 0.0 - 0.1 K/uL   Immature Granulocytes 0 %   Abs Immature Granulocytes 0.04 0.00 - 0.07 K/uL    Comment: Performed at [facility] [address], [place], [place] [address]  Basic metabolic panel     Status: Abnormal   Collection Time: 12/13/19  3:38 AM  Result Value Ref Range   Sodium 137 135 - 145 mmol/L   Potassium 4.5 3.5 - 5.1 mmol/L   Chloride 102 98 - 111 mmol/L   CO2 27 22 - 32 mmol/L   Glucose, Bld 230 (H) 70 - 99 mg/dL   BUN 21 8 - 23 mg/dL   Creatinine, Ser 1.21 (H) 0.44 - 1.00 mg/dL   Calcium 11.4 (H) 8.9 - 10.3 mg/dL   GFR calc non Af Amer 40 (L) >60 mL/min   GFR calc Af Amer 46 (L) >60 mL/min   Anion gap 8 5 - 15    Comment: Performed at [facility] [address] [place], [place] [address]  Glucose,  capillary     Status: Abnormal   Collection Time: 12/13/19  6:10 AM  Result Value Ref Range   Glucose-Capillary 200 (H) 70 - 99 mg/dL  Glucose, capillary     Status: Abnormal   Collection Time: 12/13/19 12:52 PM  Result Value Ref Range   Glucose-Capillary 178 (H) 70 - 99 mg/dL    Comment: Glucose reference range applies only to samples taken after fasting for at least 8 hours.  Glucose, capillary     Status: Abnormal   Collection Time: 12/13/19  4:05 PM  Result Value Ref Range   Glucose-Capillary 190 (H) 70 - 99 mg/dL    Comment: Glucose reference range applies only to samples taken after fasting for at least 8 hours.  Glucose, capillary     Status: Abnormal   Collection Time: 12/13/19  9:28 PM  Result Value Ref Range   Glucose-Capillary 329 (H) 70 - 99 mg/dL    Comment: Glucose reference range applies only to samples taken after fasting for at least 8 hours.   Comment 1 Notify RN    Comment 2 Document in Chart   CBC with Differential/Platelet     Status: Abnormal   Collection Time: 12/14/19  5:52 AM  Result Value Ref Range   WBC 10.7 (H) 4.0 - 10.5 K/uL   RBC 3.50 (L) 3.87 - 5.11 MIL/uL   Hemoglobin 10.1 (L) 12.0 - 15.0 g/dL   HCT 32.3 (L) 36.0 - 46.0 %   MCV 92.3 80.0 - 100.0 fL   MCH 28.9 26.0 - 34.0 pg   MCHC 31.3 30.0 - 36.0 g/dL   RDW 12.7 11.5 - 15.5 %  Platelets 212 150 - 400 K/uL   nRBC 0.0 0.0 - 0.2 %   Neutrophils Relative % 71 %   Neutro Abs 7.5 1.7 - 7.7 K/uL   Lymphocytes Relative 19 %   Lymphs Abs 2.0 0.7 - 4.0 K/uL   Monocytes Relative 10 %   Monocytes Absolute 1.0 0.1 - 1.0 K/uL   Eosinophils Relative 0 %   Eosinophils Absolute 0.0 0.0 - 0.5 K/uL   Basophils Relative 0 %   Basophils Absolute 0.0 0.0 - 0.1 K/uL   Immature Granulocytes 0 %   Abs Immature Granulocytes 0.04 0.00 - 0.07 K/uL    Comment: Performed at [facility] [address], [place], [place] [address]  Basic metabolic panel     Status: Abnormal   Collection Time: 12/14/19  5:52  AM  Result Value Ref Range   Sodium 142 135 - 145 mmol/L   Potassium 4.5 3.5 - 5.1 mmol/L   Chloride 105 98 - 111 mmol/L   CO2 29 22 - 32 mmol/L   Glucose, Bld 232 (H) 70 - 99 mg/dL    Comment: Glucose reference range applies only to samples taken after fasting for at least 8 hours.   BUN 27 (H) 8 - 23 mg/dL   Creatinine, Ser 1.37 (H) 0.44 - 1.00 mg/dL   Calcium 12.2 (H) 8.9 - 10.3 mg/dL   GFR calc non Af Amer 34 (L) >60 mL/min   GFR calc Af Amer 40 (L) >60 mL/min   Anion gap 8 5 - 15    Comment: Performed at [facility] [address] [place], [place] [address]  Glucose, capillary     Status: Abnormal   Collection Time: 12/14/19  6:37 AM  Result Value Ref Range   Glucose-Capillary 225 (H) 70 - 99 mg/dL    Comment: Glucose reference range applies only to samples taken after fasting for at least 8 hours.   Comment 1 Notify RN    Comment 2 Document in Chart    DG Abd Portable 1V  Result Date: 12/13/2019 CLINICAL DATA:  Nausea, vomiting. EXAM: PORTABLE ABDOMEN - 1 VIEW COMPARISON:  None. FINDINGS: The bowel gas pattern is normal. Probable multiple calcified uterine fibroids are noted. IMPRESSION: No evidence of bowel obstruction or ileus. Electronically Signed   By: Marijo Conception M.D.   On: 12/13/2019 14:55       Medical Problem List and Plan: 1.  Left side hemiplegia, dysarthria, aphasia secondary to left brain infarction.  MRI not completed due to pacemaker  -patient may shower  -ELOS/Goals: 25-30 days/min/mod a  Admit to CIR 2.  Antithrombotics: -DVT/anticoagulation: Subcutaneous Lovenox  -antiplatelet therapy: Aspirin 81 mg daily, Plavix 75 mg daily 3. Pain Management: Tylenol as needed 4. Mood with memory deficits.  Aricept 10 mg nightly  -antipsychotic agents: N/A 5. Neuropsych: This patient is not capable of making decisions on her own behalf. 6. Skin/Wound Care: Routine skin checks 7. Fluids/Electrolytes/Nutrition: Routine in and outs.  CMP ordered for  tomorrow a.m. 8.  Hypertension.  Norvasc 2.5 mg daily, Coreg 3.125 mg twice daily.    Monitor with increased mobility 9.  Diabetes mellitus with hyperglycemia.  Hemoglobin A1c 8.2.  Lantus insulin 10 units twice daily, NovoLog 2 units 3 times daily check blood sugars before meals and at bedtime  Monitor with increased mobility 10.  CAD with stenting as well as pacemaker.  Continue aspirin and Plavix. 11.  CKD stage III.  Creatinine baseline 1.56.  BMP ordered for tomorrow a.m. 12.  Chronic diastolic congestive heart failure.  Monitor for any signs of fluid overload  Daily weights 13.  OSA.  CPAP. 14.  Hyperlipidemia.  Lipitor 15.  Chronic normocytic anemia.  Continue iron supplement  CBC ordered for tomorrow a.m. 16.  Post stroke dysphagia: Dysphagia #2 thin liquids.  Follow-up speech therapy.  Advance as tolerated 17.  UTI/Citrobacter.  Presently on Merrem 1 g every 12 hours 12/13/2019  Lavon Paganini Angiulli, PA-C 12/14/2019  I have personally performed a face to face diagnostic evaluation, including, but not limited to relevant history and physical exam findings, of this patient and developed relevant assessment and plan.  Additionally, I have reviewed and concur with the physician assistant's documentation above.  Delice Lesch, MD, ABPMR

## 2019-12-12 NOTE — Progress Notes (Signed)
Physical Therapy Treatment Patient Details Name: [name] MRN: [id number] DOB: 09-Oct-1928 Today's Date: 12/12/2019    History of Present Illness Pt is a 86 y.o. F with significant PMH of CHF, DM, CAD with pacemaker, HTN, prior stroke, who presents with several falls in the prior 24 hours and RLE weakness. CT negative for acute abnormality. Pt unable to receive MRI due to pacemaker.     PT Comments    Pt supine on arrival and show urinary retention from bladder scan that RN was doing upon entry.  She was transferred to the Bedside commode with total +2 as she was unable to initiate steps this session with limited to no use of R side UE or LE.  Utilized sara stedy for all additional standing trial she presents with excessive flexion forward at the hips and upper trunk control and lean to the R side.  Pt continues to decline functionally at this time.  Will inform supervising PT to follow up next session to reassess POC and need for updates in recommendations if applicable.    Follow Up Recommendations  CIR     Equipment Recommendations  Wheelchair (measurements PT)    Recommendations for Other Services       Precautions / Restrictions Precautions Precautions: Fall Precaution Comments: R knee buckling and scissoring. Restrictions Weight Bearing Restrictions: No    Mobility  Bed Mobility Overal bed mobility: Needs Assistance Bed Mobility: Rolling;Sidelying to Sit Rolling: Mod assist;+2 for physical assistance Sidelying to sit: Mod assist;+2 for physical assistance       General bed mobility comments: Pt required assistance to flex R knee into hooklying and push to roll into sidelying.  PTA provided Hand over Hand assistance with RUE.  HOB elevated to push into sitting.  Pt is slow and guarded with poor sitting balance.  She required total assistance to scoot hips forward.  Transfers Overall transfer level: Needs assistance Equipment used: Ambulation equipment  used;None(sara stedy) Transfers: Sit to/from [facility] Sit to Stand: Max assist;Total assist;+2 physical assistance Stand pivot transfers: Total assist;+2 physical assistance       General transfer comment: Pt seated edge of bed on arrival and needed to toilet  She presents with decreased strength and poor balance and required total assistance to move from bed  to bed side commode.  Pt then utilized sara stedy for remainder of transfers this session.  She required increased assistance on her R side to protect her RUE and maintain balance.  Strong R lateral lean noted  in sitting on sara stedy and in standing she presented with R forward lean with excessive hip flexion.  Ambulation/Gait Ambulation/Gait assistance: (pt remains to decline functionally and gt remains unsafe at this time.)               Chief Strategy Officer    Modified Rankin (Stroke Patients Only)       Balance Overall balance assessment: Needs assistance Sitting-balance support: Feet supported Sitting balance-Leahy Scale: Poor Sitting balance - Comments: cannot tolerate challenge   Standing balance support: Bilateral upper extremity supported;During functional activity Standing balance-Leahy Scale: Zero Standing balance comment: Pt with poor balance in inital stands, as trials continued zero balance noted                            Cognition Arousal/Alertness: Awake/alert Behavior During Therapy: WFL for tasks assessed/performed  Overall Cognitive Status: History of cognitive impairments - at baseline Area of Impairment: Memory;Safety/judgement;Awareness;Problem solving                     Memory: Decreased short-term memory   Safety/Judgement: Decreased awareness of safety;Decreased awareness of deficits Awareness: Emergent Problem Solving: Requires verbal cues General Comments: pt requiring cues and support for multistep tasks for safety       Exercises      General Comments        Pertinent Vitals/Pain      Home Living                      Prior Function            PT Goals (current goals can now be found in the care plan section) Acute Rehab PT Goals Patient Stated Goal: be able to take care of self again Potential to Achieve Goals: Poor Progress towards PT goals: Not progressing toward goals - comment;PT to reassess next treatment    Frequency    Min 3X/week      PT Plan Current plan remains appropriate    Co-evaluation PT/OT/SLP Co-Evaluation/Treatment: Yes Reason for Co-Treatment: Complexity of the patient's impairments (multi-system involvement) PT goals addressed during session: Mobility/safety with mobility OT goals addressed during session: ADL's and self-care      AM-PAC PT "6 Clicks" Mobility   Outcome Measure  Help needed turning from your back to your side while in a flat bed without using bedrails?: A Lot Help needed moving from lying on your back to sitting on the side of a flat bed without using bedrails?: A Lot Help needed moving to and from a bed to a chair (including a wheelchair)?: Total Help needed standing up from a chair using your arms (e.g., wheelchair or bedside chair)?: Total Help needed to walk in hospital room?: Total Help needed climbing 3-5 steps with a railing? : Total 6 Click Score: 8    End of Session Equipment Utilized During Treatment: Gait belt Activity Tolerance: Patient tolerated treatment well Patient left: in chair;with call bell/phone within reach;with chair alarm set Nurse Communication: Mobility status PT Visit Diagnosis: Unsteadiness on feet (R26.81);History of falling (Z91.81);Difficulty in walking, not elsewhere classified (R26.2)     Time: 6767-2094 PT Time Calculation (min) (ACUTE ONLY): 30 min  Charges:  $Therapeutic Activity: 8-22 mins                     [name] , PTA Acute Rehabilitation Services Pager  [phone] Office [phone]     [name] [name] 12/12/2019, 4:09 PM

## 2019-12-13 ENCOUNTER — Inpatient Hospital Stay (HOSPITAL_COMMUNITY): Payer: Medicare Other

## 2019-12-13 LAB — CBC WITH DIFFERENTIAL/PLATELET
Abs Immature Granulocytes: 0.04 10*3/uL (ref 0.00–0.07)
Basophils Absolute: 0.1 10*3/uL (ref 0.0–0.1)
Basophils Relative: 0 %
Eosinophils Absolute: 0.1 10*3/uL (ref 0.0–0.5)
Eosinophils Relative: 1 %
HCT: 32.2 % — ABNORMAL LOW (ref 36.0–46.0)
Hemoglobin: 10.1 g/dL — ABNORMAL LOW (ref 12.0–15.0)
Immature Granulocytes: 0 %
Lymphocytes Relative: 18 %
Lymphs Abs: 2.3 10*3/uL (ref 0.7–4.0)
MCH: 28.8 pg (ref 26.0–34.0)
MCHC: 31.4 g/dL (ref 30.0–36.0)
MCV: 91.7 fL (ref 80.0–100.0)
Monocytes Absolute: 1.1 10*3/uL — ABNORMAL HIGH (ref 0.1–1.0)
Monocytes Relative: 9 %
Neutro Abs: 9.1 10*3/uL — ABNORMAL HIGH (ref 1.7–7.7)
Neutrophils Relative %: 72 %
Platelets: 214 10*3/uL (ref 150–400)
RBC: 3.51 MIL/uL — ABNORMAL LOW (ref 3.87–5.11)
RDW: 12.7 % (ref 11.5–15.5)
WBC: 12.6 10*3/uL — ABNORMAL HIGH (ref 4.0–10.5)
nRBC: 0 % (ref 0.0–0.2)

## 2019-12-13 LAB — BASIC METABOLIC PANEL
Anion gap: 8 (ref 5–15)
BUN: 21 mg/dL (ref 8–23)
CO2: 27 mmol/L (ref 22–32)
Calcium: 11.4 mg/dL — ABNORMAL HIGH (ref 8.9–10.3)
Chloride: 102 mmol/L (ref 98–111)
Creatinine, Ser: 1.21 mg/dL — ABNORMAL HIGH (ref 0.44–1.00)
GFR calc Af Amer: 46 mL/min — ABNORMAL LOW (ref >60)
GFR calc non Af Amer: 40 mL/min — ABNORMAL LOW (ref >60)
Glucose, Bld: 230 mg/dL — ABNORMAL HIGH (ref 70–99)
Potassium: 4.5 mmol/L (ref 3.5–5.1)
Sodium: 137 mmol/L (ref 135–145)

## 2019-12-13 LAB — GLUCOSE, CAPILLARY
Glucose-Capillary: 200 mg/dL — ABNORMAL HIGH (ref 70–99)
Glucose-Capillary: 178 mg/dL — ABNORMAL HIGH (ref 70–99)
Glucose-Capillary: 190 mg/dL — ABNORMAL HIGH (ref 70–99)
Glucose-Capillary: 329 mg/dL — ABNORMAL HIGH (ref 70–99)

## 2019-12-13 MED ORDER — SODIUM CHLORIDE 0.9 % IV SOLN
1.0000 g | Freq: Two times a day (BID) | INTRAVENOUS | Status: DC
  Administered 2019-12-13 – 2019-12-14 (×3): 1 g via INTRAVENOUS
  Filled 2019-12-13 (×4): qty 1

## 2019-12-13 MED ORDER — INSULIN ASPART 100 UNIT/ML ~~LOC~~ SOLN
7.0000 [IU] | Freq: Once | SUBCUTANEOUS | Status: AC
  Administered 2019-12-13: 7 [IU] via SUBCUTANEOUS

## 2019-12-13 MED ORDER — INSULIN GLARGINE 100 UNIT/ML ~~LOC~~ SOLN
8.0000 [IU] | Freq: Two times a day (BID) | SUBCUTANEOUS | Status: DC
  Administered 2019-12-13: 8 [IU] via SUBCUTANEOUS
  Filled 2019-12-13 (×3): qty 0.08

## 2019-12-13 MED ORDER — ONDANSETRON HCL 4 MG/2ML IJ SOLN
4.0000 mg | Freq: Three times a day (TID) | INTRAMUSCULAR | Status: DC | PRN
  Administered 2019-12-13: 4 mg via INTRAVENOUS
  Filled 2019-12-13: qty 2

## 2019-12-13 NOTE — Progress Notes (Addendum)
Paged on call about cbg of [phone]; administered 8 units of Lantus; contacted MD on call; awaiting orders

## 2019-12-13 NOTE — Progress Notes (Signed)
  Speech Language Pathology Treatment: Dysphagia  Patient Details Name: [name] MRN: [id number] DOB: September 21, 1928 Today's Date: 12/13/2019 Time: 1202-1214 SLP Time Calculation (min) (ACUTE ONLY): 12 min  Assessment / Plan / Recommendation Clinical Impression  Pt received sitting upright in bed, HOB raised. Patient's daughter present at bedside. ST educated daughter re: patient's dysphagia and rationale for her current modified diet texture (dysphagia 2) as well as recommendations. Daughter nodded.  Patient provided with 2 cup sips of thin liquid tea: mildly prolonged oral transit, no overt s/sx aspiration. Pt then seen with 3 straw sips of thin liquid water: spontaneous double swallow noted x1, no other overt s/sx aspiration. Pt seen with 2 bites of pureed mashed potatoes: notable for reduced bolus manipulation and oral holding, patient able to initiate the swallow with no overt s/sx aspiration and good oral clearance. Patient then suddenly with some moaning/grimacing, stating "stomach" when asked what hurt. Patient began to vomit, ST called for RN, RN and NT entered. Session ended early. ST will follow as per POC.  Recommend continue dysphagia 2 solids/thin liquids, full supervision, small bites/sips, sit upright for all PO intake.    HPI HPI: Pt is a 86 y.o. F with significant PMH of CHF, DM, CAD with pacemaker, HTN, prior stroke, who presents with several falls in the prior 24 hours and RLE weakness. CT negative for acute abnormality. Pt unable to receive MRI due to pacemaker. New CT ordered 2/18, not yet completed at time of this BSE      SLP Plan  Continue with current plan of care       Recommendations  Diet recommendations: Dysphagia 2 (fine chop);Thin liquid Liquids provided via: Cup;Straw Medication Administration: Crushed with puree Supervision: Full supervision/cueing for compensatory strategies Compensations: Minimize environmental distractions;Slow rate;Small  sips/bites;Lingual sweep for clearance of pocketing Postural Changes and/or Swallow Maneuvers: Seated upright 90 degrees;Upright 30-60 min after meal                Oral Care Recommendations: Oral care BID;Staff/trained caregiver to provide oral care Follow up Recommendations: 24 hour supervision/assistance;Skilled Nursing facility;Inpatient Rehab SLP Visit Diagnosis: Dysphagia, unspecified (R13.10) Plan: Continue with current plan of care       [place] 12/13/2019, 12:22 PM Marina Goodell, M.Ed., [facility] [place] [facility] Acute Rehabilitation [phone]: Acute Rehab office [phone] - pager

## 2019-12-13 NOTE — Progress Notes (Addendum)
PROGRESS NOTE  [name] VXB:[id number] DOB: 1927-11-01 DOA: 12/06/2019 PCP: [name], MD  HPI/Recap of past 24 hours: HPI from Dr Felizardo Hoffmann is a 86 y.o. female with medical history significant for CAD, CVA on Plavix, heart block s/p pacemaker, hypertension, Type 2 diabetes, OSA on CPAP who presents for concerns of a fall. Patient was unable to recall much of her symptoms and there was no family at bedside. Patient states that she was walking with her cane and when she turned she fell onto the ground and hit her head.  Leg weakness.  She denies any dizziness or loss of consciousness.  Thinks she might have fell another time prior to this but could not recall the event. Per ED documentation, daughter brought patient and due to concerns of slurred speech and left-sided weakness. All symptoms resolved in the ED. In the ER, VSS. Labs showed creatinine elevated at 1.62 from a prior of 1.24.  CT head negative for any acute abnormalities. EDP discussed with neurology who recommends admission.     Today, patient still with right-sided hemiplegia.  Patient denies any new complaints.  Noted to have an episode of emesis this afternoon with some abdominal pain.  Abdominal x-ray pending.  Patient denies any chest pain, shortness of breath, fever/chills.  Had a BM.     Assessment/Plan: Principal Problem:   Weakness Active Problems:   Hypercholesterolemia   Hypertension   Type 2 diabetes mellitus (HCC)   Obstructive sleep apnea   Pacemaker   CKD (chronic kidney disease), stage II   History of CVA (cerebrovascular accident)   OSA (obstructive sleep apnea)   Chronic diastolic congestive heart failure (HCC)   Right-sided hemiparesis likely 2/2 CVA Presents with RLE weakness, slurred speech Initial CT head negative for any acute intracranial abnormality, repeat X 2 negative as well Unable to obtain MRI brain due to incompatible pacemaker Neurology on board, spoke to Dr. Leonie Man  on 12/09/2019 about worsening right-sided weakness with right facial droop, no further recommendation Echo showed EF of 50 to 55%, no regional wall motion abnormality, no mention of any cardiac thrombus A1c, 8.2, LDL 119 Continue Plavix, Lipitor, started on aspirin PT/OT/SLP Frequent neurochecks, telemetry  UTI UA showed small leukocytes, few bacteria UC grew Citrobacter Koseri Discussed with ID, Dr Talbot Grumbling on 12/13/19, ok to start on imipenem pending susceptibilities Start imipenem  CKD stage IIIa/hx of hypocalcemia Creatinine around baseline Noted some mild hypercalcemia Will hold home calcitriol for now Daily BMP  Chronic normocytic anemia Hemoglobin at baseline Continue ferrous sulfate Daily CBC  Diabetes mellitus type 2 Uncontrolled A1c 8.2 SSI, started lantus, Accu-Cheks, hypoglycemic protocol Hold home insulin regimen, Tradjenta  Hypertension  Continue home amlodipine, coreg, gradually add on as BP permits Hold valsartan, HCTZ  CAD/hyperlipidemia Continue statins, Coreg  Heart block status post pacemaker Stable  OSA Continue CPAP  Mild cognitive impairment/?Dementia Continue Aricept          Malnutrition Type:      Malnutrition Characteristics:      Nutrition Interventions:       Estimated body mass index is 27.21 kg/m as calculated from the following:   Height as of this encounter: 5' 7"  (1.702 m).   Weight as of this encounter: 78.8 kg.     Code Status: DNR  Family Communication: Discussed with daughter on 12/11/2019, attempted to call both daughters on 12/13/19, unable to reach them  Disposition Plan: Likely CIR, awaiting placement   Consultants:  Neurology  Procedures:  None  Antimicrobials:  Imipenem  DVT prophylaxis: Lovenox   Objective: Vitals:   12/12/19 2321 12/13/19 0414 12/13/19 0843 12/13/19 1302  BP: 134/62 (!) 146/54 (!) 141/69 (!) 146/58  Pulse: 72 70 78 70  Resp: 18 18 (!) 21 20  Temp: 98 F  (36.7 C) 98 F (36.7 C) 97.6 F (36.4 C) 98.5 F (36.9 C)  TempSrc: Oral Oral Oral Oral  SpO2: 99% 99% 91% 100%  Weight:      Height:        Intake/Output Summary (Last 24 hours) at 12/13/2019 1408 Last data filed at 12/13/2019 0037 Gross per 24 hour  Intake 120 ml  Output 1100 ml  Net -980 ml   Filed Weights   12/07/19 0200  Weight: 78.8 kg    Exam:  General: NAD   Cardiovascular: S1, S2 present  Respiratory: CTAB  Abdomen: Soft, nontender, nondistended, bowel sounds present  Musculoskeletal: No bilateral pedal edema noted  Skin: Normal  Psychiatry: Normal mood  Neurology: Noted right facial droop, right upper/lower extremity weakness about 1/5, 5/5 strength in left upper/lower extremities     Data Reviewed: CBC: Recent Labs  Lab 12/09/19 0433 12/10/19 0341 12/11/19 0346 12/12/19 0309 12/13/19 0338  WBC 7.0 8.1 7.2 12.3* 12.6*  NEUTROABS 3.7 3.7 3.6 9.8* 9.1*  HGB 10.2* 10.7* 10.4* 10.8* 10.1*  HCT 32.4* 34.0* 32.9* 34.4* 32.2*  MCV 92.3 92.1 91.9 92.7 91.7  PLT 225 217 227 245 462   Basic Metabolic Panel: Recent Labs  Lab 12/09/19 0433 12/10/19 0341 12/11/19 0346 12/12/19 0309 12/13/19 0338  NA 137 139 140 137 137  K 3.9 4.5 4.2 4.5 4.5  CL 102 102 103 100 102  CO2 26 28 28 25 27   GLUCOSE 190* 182* 183* 246* 230*  BUN 19 20 20  24* 21  CREATININE 1.38* 1.29* 1.22* 1.42* 1.21*  CALCIUM 10.9* 11.5* 11.1* 11.8* 11.4*   GFR: Estimated Creatinine Clearance: 34.1 mL/min (A) (by C-G formula based on SCr of 1.21 mg/dL (H)). Liver Function Tests: Recent Labs  Lab 12/06/19 1650  AST 24  ALT 27  ALKPHOS 67  BILITOT 0.7  PROT 7.0  ALBUMIN 3.7   No results for input(s): LIPASE, AMYLASE in the last 168 hours. No results for input(s): AMMONIA in the last 168 hours. Coagulation Profile: Recent Labs  Lab 12/06/19 1650  INR 1.0   Cardiac Enzymes: No results for input(s): CKTOTAL, CKMB, CKMBINDEX, TROPONINI in the last 168 hours. BNP  (last 3 results) No results for input(s): PROBNP in the last 8760 hours. HbA1C: No results for input(s): HGBA1C in the last 72 hours. CBG: Recent Labs  Lab 12/12/19 0608 12/12/19 1230 12/12/19 1539 12/13/19 0610 12/13/19 1252  GLUCAP 193* 202* 220* 200* 178*   Lipid Profile: No results for input(s): CHOL, HDL, LDLCALC, TRIG, CHOLHDL, LDLDIRECT in the last 72 hours. Thyroid Function Tests: No results for input(s): TSH, T4TOTAL, FREET4, T3FREE, THYROIDAB in the last 72 hours. Anemia Panel: No results for input(s): VITAMINB12, FOLATE, FERRITIN, TIBC, IRON, RETICCTPCT in the last 72 hours. Urine analysis:    Component Value Date/Time   COLORURINE YELLOW 12/12/2019 1030   APPEARANCEUR HAZY (A) 12/12/2019 1030   LABSPEC 1.015 12/12/2019 1030   PHURINE 6.0 12/12/2019 1030   GLUCOSEU NEGATIVE 12/12/2019 1030   HGBUR NEGATIVE 12/12/2019 1030   BILIRUBINUR NEGATIVE 12/12/2019 1030   KETONESUR NEGATIVE 12/12/2019 1030   PROTEINUR NEGATIVE 12/12/2019 1030   UROBILINOGEN 0.2 08/07/2015 1826   NITRITE NEGATIVE  12/12/2019 1030   LEUKOCYTESUR SMALL (A) 12/12/2019 1030   Sepsis Labs: @LABRCNTIP (procalcitonin:4,lacticidven:4)  ) Recent Results (from the past 240 hour(s))  SARS CORONAVIRUS 2 (TAT 6-24 HRS) Nasopharyngeal Nasopharyngeal Swab     Status: None   Collection Time: 12/06/19  9:56 PM   Specimen: Nasopharyngeal Swab  Result Value Ref Range Status   SARS Coronavirus 2 NEGATIVE NEGATIVE Final    Comment: (NOTE) SARS-CoV-2 target nucleic acids are NOT DETECTED. The SARS-CoV-2 RNA is generally detectable in upper and lower respiratory specimens during the acute phase of infection. Negative results do not preclude SARS-CoV-2 infection, do not rule out co-infections with other pathogens, and should not be used as the sole basis for treatment or other patient management decisions. Negative results must be combined with clinical observations, patient history, and epidemiological  information. The expected result is Negative. Fact Sheet for Patients: SugarRoll.be Fact Sheet for Healthcare Providers: https://www.woods-mathews.com/ This test is not yet approved or cleared by the [place] FDA and  has been authorized for detection and/or diagnosis of SARS-CoV-2 by FDA under an Emergency Use Authorization (EUA). This EUA will remain  in effect (meaning this test can be used) for the duration of the COVID-19 declaration under Section 56 4(b)(1) of the Act, 21 U.S.C. section 360bbb-3(b)(1), unless the authorization is terminated or revoked sooner. Performed at [facility], [place] [address], [place], [place] [address]   Culture, Urine     Status: Abnormal   Collection Time: 12/07/19  9:23 PM   Specimen: Urine, Random  Result Value Ref Range Status   Specimen Description URINE, RANDOM  Final   Special Requests   Final    NONE Performed at [facility], [place] [address], [place], [place] [address]    Culture MULTIPLE SPECIES PRESENT, SUGGEST RECOLLECTION (A)  Final   Report Status 12/08/2019 FINAL  Final  Culture, Urine     Status: Abnormal (Preliminary result)   Collection Time: 12/12/19  7:13 AM   Specimen: Urine, Random  Result Value Ref Range Status   Specimen Description URINE, RANDOM  Final   Special Requests NONE  Final   Culture (A)  Final    >=100,000 COLONIES/mL CITROBACTER KOSERI SUSCEPTIBILITIES TO FOLLOW Performed at [facility], [place] [address], [place], [place] [address]    Report Status PENDING  Incomplete      Studies: No results found.  Scheduled Meds: . amLODipine  2.5 mg Oral Daily  . aspirin EC  81 mg Oral Daily  . atorvastatin  80 mg Oral q morning - 10a  . carvedilol  3.125 mg Oral BID  . clopidogrel  75 mg Oral Daily  . donepezil  10 mg Oral QHS  . enoxaparin (LOVENOX) injection  30 mg Subcutaneous Daily  . ferrous sulfate  325 mg Oral Q breakfast  . insulin aspart   0-15 Units Subcutaneous TID WC  . insulin aspart  3 Units Subcutaneous TID WC  . insulin glargine  8 Units Subcutaneous BID  . pantoprazole  40 mg Oral QAC breakfast  . polyethylene glycol  17 g Oral BID  . senna-docusate  1 tablet Oral BID    Continuous Infusions:   LOS: 7 days     Alma Friendly, MD [facility]  If 7PM-7AM, please contact night-coverage [email or web address] 12/13/2019, 2:08 PM

## 2019-12-13 NOTE — Progress Notes (Signed)
Pharmacy Antibiotic Note  [name] is a 86 y.o. female admitted on 12/06/2019 with UTI.  Pharmacy has been consulted for meropenem dosing. ID has been consulted and recommended carbapenem pending sensitivities for citrobacter koseri in urine.   Plan: Meropenem 1gm IV q12h Monitor for renal function, de-escalation, and LOT  Height: 5' 7"  (170.2 cm)(approximately 5'7") Weight: 173 lb 11.6 oz (78.8 kg) IBW/kg (Calculated) : 61.6  Temp (24hrs), Avg:98.3 F (36.8 C), Min:97.6 F (36.4 C), Max:99.3 F (37.4 C)  Recent Labs  Lab 12/09/19 0433 12/10/19 0341 12/11/19 0346 12/12/19 0309 12/13/19 0338  WBC 7.0 8.1 7.2 12.3* 12.6*  CREATININE 1.38* 1.29* 1.22* 1.42* 1.21*    Estimated Creatinine Clearance: 34.1 mL/min (A) (by C-G formula based on SCr of 1.21 mg/dL (H)).    No Known Allergies  Antimicrobials this admission: 2/23 meropenem >>   Microbiology results:  2/22 UCx: citrobacter koseri >100K Sens pending   Annielee Jemmott A. Levada Dy, PharmD, BCPS, FNKF Clinical Pharmacist [place] Please utilize Amion for appropriate phone number to reach the unit pharmacist ([place])   12/13/2019 2:44 PM

## 2019-12-13 NOTE — Progress Notes (Addendum)
Inpatient Rehabilitation Admissions Coordinator  I have insurance approval for CIR. I await bed availability. I will contact family today to make them aware.  Danne Baxter, RN, MSN Rehab Admissions Coordinator [phone] 12/13/2019 8:37 AM   I discussed cost of care and plan for CIR when bed is available with pt's daughter, Ms. Armandina Gemma by phone. She is in agreement. I will follow up tomorrow.  Danne Baxter, RN, MSN Rehab Admissions Coordinator [phone] 12/13/2019 4:41 PM

## 2019-12-13 NOTE — Progress Notes (Signed)
Inpatient Diabetes Program Recommendations  AACE/ADA: New Consensus Statement on Inpatient Glycemic Control (2015)  Target Ranges:  Prepandial:   less than 140 mg/dL      Peak postprandial:   less than 180 mg/dL (1-2 hours)      Critically ill patients:  140 - 180 mg/dL   Lab Results  Component Value Date   GLUCAP 178 (H) 12/13/2019   HGBA1C 8.2 (H) 12/07/2019    Review of Glycemic Control   Results for [name], [name] (MRN [id number]) as of 12/13/2019 13:03  Ref. Range 12/12/2019 06:08 12/12/2019 12:30 12/12/2019 15:39 12/13/2019 06:10 12/13/2019 12:52  Glucose-Capillary Latest Ref Range: 70 - 99 mg/dL 193 (H) 202 (H) 220 (H) 200 (H) 178 (H)   Diabetes history: DM2 Outpatient Diabetes medications: Tresiba 10-20 UNITS qhs + Humalog 6 units TID + Trajenta 5 mg QD Current orders for Inpatient glycemic control:   Inpatient Diabetes Program Recommendations:     Noted that CBG's are consistently elevated >260m/dl  -Please consider Levemir 8 units BID (0.2 units/ 78.8kg)  Sent secure chat to MD  Thank you, JReche Dixon RN, BSN Diabetes Coordinator Inpatient Diabetes Program [phone](team pager from 8a-5p)

## 2019-12-13 NOTE — Progress Notes (Signed)
Patient having small episodes of emesis, family continues to feed the patient, PRN Zofran given to patient, Patient has emesis bag and clean gown on

## 2019-12-14 ENCOUNTER — Encounter (HOSPITAL_COMMUNITY): Payer: Self-pay | Admitting: Physical Medicine & Rehabilitation

## 2019-12-14 ENCOUNTER — Inpatient Hospital Stay (HOSPITAL_COMMUNITY)
Admission: RE | Admit: 2019-12-14 | Discharge: 2020-01-10 | DRG: 057 | Disposition: A | Discharge status: Skilled Nursing Facility | Payer: Medicare Other | Source: Intra-hospital | Attending: Physical Medicine & Rehabilitation | Admitting: Physical Medicine & Rehabilitation

## 2019-12-14 ENCOUNTER — Other Ambulatory Visit: Payer: Self-pay

## 2019-12-14 DIAGNOSIS — I2510 Atherosclerotic heart disease of native coronary artery without angina pectoris: Secondary | ICD-10-CM | POA: Diagnosis present

## 2019-12-14 DIAGNOSIS — I130 Hypertensive heart and chronic kidney disease with heart failure and stage 1 through stage 4 chronic kidney disease, or unspecified chronic kidney disease: Secondary | ICD-10-CM | POA: Diagnosis present

## 2019-12-14 DIAGNOSIS — N390 Urinary tract infection, site not specified: Secondary | ICD-10-CM | POA: Diagnosis present

## 2019-12-14 DIAGNOSIS — I69354 Hemiplegia and hemiparesis following cerebral infarction affecting left non-dominant side: Principal | ICD-10-CM

## 2019-12-14 DIAGNOSIS — E785 Hyperlipidemia, unspecified: Secondary | ICD-10-CM | POA: Diagnosis present

## 2019-12-14 DIAGNOSIS — G8191 Hemiplegia, unspecified affecting right dominant side: Secondary | ICD-10-CM | POA: Diagnosis not present

## 2019-12-14 DIAGNOSIS — K626 Ulcer of anus and rectum: Secondary | ICD-10-CM | POA: Diagnosis not present

## 2019-12-14 DIAGNOSIS — R1312 Dysphagia, oropharyngeal phase: Secondary | ICD-10-CM | POA: Diagnosis present

## 2019-12-14 DIAGNOSIS — N1832 Chronic kidney disease, stage 3b: Secondary | ICD-10-CM | POA: Diagnosis present

## 2019-12-14 DIAGNOSIS — Z20822 Contact with and (suspected) exposure to covid-19: Secondary | ICD-10-CM | POA: Diagnosis present

## 2019-12-14 DIAGNOSIS — B9620 Unspecified Escherichia coli [E. coli] as the cause of diseases classified elsewhere: Secondary | ICD-10-CM | POA: Diagnosis present

## 2019-12-14 DIAGNOSIS — K920 Hematemesis: Secondary | ICD-10-CM | POA: Diagnosis not present

## 2019-12-14 DIAGNOSIS — E860 Dehydration: Secondary | ICD-10-CM | POA: Diagnosis not present

## 2019-12-14 DIAGNOSIS — I5042 Chronic combined systolic (congestive) and diastolic (congestive) heart failure: Secondary | ICD-10-CM | POA: Diagnosis present

## 2019-12-14 DIAGNOSIS — Z8249 Family history of ischemic heart disease and other diseases of the circulatory system: Secondary | ICD-10-CM

## 2019-12-14 DIAGNOSIS — Z794 Long term (current) use of insulin: Secondary | ICD-10-CM

## 2019-12-14 DIAGNOSIS — D638 Anemia in other chronic diseases classified elsewhere: Secondary | ICD-10-CM

## 2019-12-14 DIAGNOSIS — E1122 Type 2 diabetes mellitus with diabetic chronic kidney disease: Secondary | ICD-10-CM | POA: Diagnosis present

## 2019-12-14 DIAGNOSIS — G3184 Mild cognitive impairment, so stated: Secondary | ICD-10-CM | POA: Diagnosis present

## 2019-12-14 DIAGNOSIS — K219 Gastro-esophageal reflux disease without esophagitis: Secondary | ICD-10-CM | POA: Diagnosis present

## 2019-12-14 DIAGNOSIS — E1165 Type 2 diabetes mellitus with hyperglycemia: Secondary | ICD-10-CM | POA: Diagnosis not present

## 2019-12-14 DIAGNOSIS — I69322 Dysarthria following cerebral infarction: Secondary | ICD-10-CM

## 2019-12-14 DIAGNOSIS — I255 Ischemic cardiomyopathy: Secondary | ICD-10-CM | POA: Diagnosis present

## 2019-12-14 DIAGNOSIS — I10 Essential (primary) hypertension: Secondary | ICD-10-CM | POA: Diagnosis not present

## 2019-12-14 DIAGNOSIS — I442 Atrioventricular block, complete: Secondary | ICD-10-CM | POA: Diagnosis present

## 2019-12-14 DIAGNOSIS — Z950 Presence of cardiac pacemaker: Secondary | ICD-10-CM

## 2019-12-14 DIAGNOSIS — H409 Unspecified glaucoma: Secondary | ICD-10-CM | POA: Diagnosis present

## 2019-12-14 DIAGNOSIS — E7800 Pure hypercholesterolemia, unspecified: Secondary | ICD-10-CM | POA: Diagnosis present

## 2019-12-14 DIAGNOSIS — E1142 Type 2 diabetes mellitus with diabetic polyneuropathy: Secondary | ICD-10-CM | POA: Diagnosis present

## 2019-12-14 DIAGNOSIS — E870 Hyperosmolality and hypernatremia: Secondary | ICD-10-CM | POA: Diagnosis not present

## 2019-12-14 DIAGNOSIS — N179 Acute kidney failure, unspecified: Secondary | ICD-10-CM | POA: Diagnosis not present

## 2019-12-14 DIAGNOSIS — Z955 Presence of coronary angioplasty implant and graft: Secondary | ICD-10-CM | POA: Diagnosis not present

## 2019-12-14 DIAGNOSIS — Z833 Family history of diabetes mellitus: Secondary | ICD-10-CM

## 2019-12-14 DIAGNOSIS — I639 Cerebral infarction, unspecified: Secondary | ICD-10-CM | POA: Diagnosis present

## 2019-12-14 DIAGNOSIS — Z8673 Personal history of transient ischemic attack (TIA), and cerebral infarction without residual deficits: Secondary | ICD-10-CM | POA: Diagnosis not present

## 2019-12-14 DIAGNOSIS — Z7902 Long term (current) use of antithrombotics/antiplatelets: Secondary | ICD-10-CM

## 2019-12-14 DIAGNOSIS — R1310 Dysphagia, unspecified: Secondary | ICD-10-CM

## 2019-12-14 DIAGNOSIS — E1169 Type 2 diabetes mellitus with other specified complication: Secondary | ICD-10-CM | POA: Diagnosis not present

## 2019-12-14 DIAGNOSIS — I69391 Dysphagia following cerebral infarction: Secondary | ICD-10-CM

## 2019-12-14 DIAGNOSIS — E876 Hypokalemia: Secondary | ICD-10-CM | POA: Diagnosis not present

## 2019-12-14 DIAGNOSIS — L89616 Pressure-induced deep tissue damage of right heel: Secondary | ICD-10-CM | POA: Diagnosis not present

## 2019-12-14 DIAGNOSIS — E871 Hypo-osmolality and hyponatremia: Secondary | ICD-10-CM | POA: Diagnosis not present

## 2019-12-14 DIAGNOSIS — M1990 Unspecified osteoarthritis, unspecified site: Secondary | ICD-10-CM | POA: Diagnosis present

## 2019-12-14 DIAGNOSIS — I459 Conduction disorder, unspecified: Secondary | ICD-10-CM | POA: Diagnosis present

## 2019-12-14 DIAGNOSIS — Z87891 Personal history of nicotine dependence: Secondary | ICD-10-CM

## 2019-12-14 DIAGNOSIS — Z8349 Family history of other endocrine, nutritional and metabolic diseases: Secondary | ICD-10-CM

## 2019-12-14 DIAGNOSIS — Z4659 Encounter for fitting and adjustment of other gastrointestinal appliance and device: Secondary | ICD-10-CM

## 2019-12-14 DIAGNOSIS — Z431 Encounter for attention to gastrostomy: Secondary | ICD-10-CM

## 2019-12-14 DIAGNOSIS — E119 Type 2 diabetes mellitus without complications: Secondary | ICD-10-CM

## 2019-12-14 DIAGNOSIS — R339 Retention of urine, unspecified: Secondary | ICD-10-CM | POA: Diagnosis not present

## 2019-12-14 DIAGNOSIS — L8990 Pressure ulcer of unspecified site, unspecified stage: Secondary | ICD-10-CM | POA: Insufficient documentation

## 2019-12-14 DIAGNOSIS — G4733 Obstructive sleep apnea (adult) (pediatric): Secondary | ICD-10-CM | POA: Diagnosis present

## 2019-12-14 DIAGNOSIS — R0602 Shortness of breath: Secondary | ICD-10-CM

## 2019-12-14 DIAGNOSIS — D631 Anemia in chronic kidney disease: Secondary | ICD-10-CM | POA: Diagnosis present

## 2019-12-14 DIAGNOSIS — Z809 Family history of malignant neoplasm, unspecified: Secondary | ICD-10-CM

## 2019-12-14 DIAGNOSIS — E210 Primary hyperparathyroidism: Secondary | ICD-10-CM | POA: Diagnosis present

## 2019-12-14 LAB — BASIC METABOLIC PANEL
Anion gap: 8 (ref 5–15)
BUN: 27 mg/dL — ABNORMAL HIGH (ref 8–23)
CO2: 29 mmol/L (ref 22–32)
Calcium: 12.2 mg/dL — ABNORMAL HIGH (ref 8.9–10.3)
Chloride: 105 mmol/L (ref 98–111)
Creatinine, Ser: 1.37 mg/dL — ABNORMAL HIGH (ref 0.44–1.00)
GFR calc Af Amer: 40 mL/min — ABNORMAL LOW (ref >60)
GFR calc non Af Amer: 34 mL/min — ABNORMAL LOW (ref >60)
Glucose, Bld: 232 mg/dL — ABNORMAL HIGH (ref 70–99)
Potassium: 4.5 mmol/L (ref 3.5–5.1)
Sodium: 142 mmol/L (ref 135–145)

## 2019-12-14 LAB — CBC WITH DIFFERENTIAL/PLATELET
Abs Immature Granulocytes: 0.04 10*3/uL (ref 0.00–0.07)
Basophils Absolute: 0 10*3/uL (ref 0.0–0.1)
Basophils Relative: 0 %
Eosinophils Absolute: 0 10*3/uL (ref 0.0–0.5)
Eosinophils Relative: 0 %
HCT: 32.3 % — ABNORMAL LOW (ref 36.0–46.0)
Hemoglobin: 10.1 g/dL — ABNORMAL LOW (ref 12.0–15.0)
Immature Granulocytes: 0 %
Lymphocytes Relative: 19 %
Lymphs Abs: 2 10*3/uL (ref 0.7–4.0)
MCH: 28.9 pg (ref 26.0–34.0)
MCHC: 31.3 g/dL (ref 30.0–36.0)
MCV: 92.3 fL (ref 80.0–100.0)
Monocytes Absolute: 1 10*3/uL (ref 0.1–1.0)
Monocytes Relative: 10 %
Neutro Abs: 7.5 10*3/uL (ref 1.7–7.7)
Neutrophils Relative %: 71 %
Platelets: 212 10*3/uL (ref 150–400)
RBC: 3.5 MIL/uL — ABNORMAL LOW (ref 3.87–5.11)
RDW: 12.7 % (ref 11.5–15.5)
WBC: 10.7 10*3/uL — ABNORMAL HIGH (ref 4.0–10.5)
nRBC: 0 % (ref 0.0–0.2)

## 2019-12-14 LAB — GLUCOSE, CAPILLARY
Glucose-Capillary: 225 mg/dL — ABNORMAL HIGH (ref 70–99)
Glucose-Capillary: 214 mg/dL — ABNORMAL HIGH (ref 70–99)
Glucose-Capillary: 144 mg/dL — ABNORMAL HIGH (ref 70–99)
Glucose-Capillary: 304 mg/dL — ABNORMAL HIGH (ref 70–99)
Glucose-Capillary: 250 mg/dL — ABNORMAL HIGH (ref 70–99)

## 2019-12-14 MED ORDER — DORZOLAMIDE HCL 2 % OP SOLN: 1.0000 [drp] | Freq: Two times a day (BID) | OPHTHALMIC | Status: DC

## 2019-12-14 MED ORDER — ACETAMINOPHEN 325 MG PO TABS
650.0000 mg | ORAL_TABLET | ORAL | Status: DC | PRN
  Administered 2020-01-04 – 2020-01-09 (×6): 650 mg via ORAL
  Filled 2019-12-14 (×6): qty 2

## 2019-12-14 MED ORDER — INSULIN ASPART 100 UNIT/ML ~~LOC~~ SOLN
0.0000 [IU] | Freq: Three times a day (TID) | SUBCUTANEOUS | Status: DC
  Administered 2019-12-14: 3 [IU] via SUBCUTANEOUS

## 2019-12-14 MED ORDER — CEPHALEXIN 250 MG PO CAPS
500.0000 mg | ORAL_CAPSULE | Freq: Two times a day (BID) | ORAL | Status: DC
  Administered 2019-12-14 – 2019-12-17 (×7): 500 mg via ORAL
  Filled 2019-12-14 (×8): qty 2

## 2019-12-14 MED ORDER — ENOXAPARIN SODIUM 30 MG/0.3ML ~~LOC~~ SOLN: 30.0000 mg | SUBCUTANEOUS | Status: DC

## 2019-12-14 MED ORDER — ENOXAPARIN SODIUM 30 MG/0.3ML ~~LOC~~ SOLN
30.0000 mg | Freq: Every day | SUBCUTANEOUS | Status: AC
  Administered 2019-12-15 – 2020-01-02 (×18): 30 mg via SUBCUTANEOUS
  Filled 2019-12-14 (×18): qty 0.3

## 2019-12-14 MED ORDER — POLYETHYLENE GLYCOL 3350 17 G PO PACK
17.0000 g | PACK | Freq: Two times a day (BID) | ORAL | Status: DC
  Administered 2019-12-14 – 2019-12-19 (×8): 17 g via ORAL
  Filled 2019-12-14 (×10): qty 1

## 2019-12-14 MED ORDER — AMLODIPINE BESYLATE 2.5 MG PO TABS
2.5000 mg | ORAL_TABLET | Freq: Every day | ORAL | Status: DC
  Administered 2019-12-15 – 2019-12-17 (×3): 2.5 mg via ORAL
  Filled 2019-12-14 (×5): qty 1

## 2019-12-14 MED ORDER — ASPIRIN 81 MG PO TBEC: 81.0000 mg | DELAYED_RELEASE_TABLET | Freq: Every day | ORAL | Status: DC

## 2019-12-14 MED ORDER — ACETAMINOPHEN 650 MG RE SUPP: 650.0000 mg | RECTAL | Status: DC | PRN

## 2019-12-14 MED ORDER — SENNOSIDES-DOCUSATE SODIUM 8.6-50 MG PO TABS
1.0000 | ORAL_TABLET | Freq: Two times a day (BID) | ORAL | Status: DC
  Administered 2019-12-14 – 2019-12-19 (×8): 1 via ORAL
  Filled 2019-12-14 (×10): qty 1

## 2019-12-14 MED ORDER — LATANOPROST 0.005 % OP SOLN
1.0000 [drp] | Freq: Every day | OPHTHALMIC | Status: DC
  Administered 2019-12-14 – 2020-01-09 (×27): 1 [drp] via OPHTHALMIC
  Filled 2019-12-14: qty 2.5

## 2019-12-14 MED ORDER — ATORVASTATIN CALCIUM 80 MG PO TABS
80.0000 mg | ORAL_TABLET | Freq: Every morning | ORAL | Status: DC
  Administered 2019-12-15 – 2019-12-17 (×3): 80 mg via ORAL
  Filled 2019-12-14 (×3): qty 1

## 2019-12-14 MED ORDER — CLOPIDOGREL BISULFATE 75 MG PO TABS
75.0000 mg | ORAL_TABLET | Freq: Every day | ORAL | Status: DC
  Administered 2019-12-15 – 2019-12-17 (×3): 75 mg via ORAL
  Filled 2019-12-14 (×5): qty 1

## 2019-12-14 MED ORDER — CARVEDILOL 3.125 MG PO TABS
3.1250 mg | ORAL_TABLET | Freq: Two times a day (BID) | ORAL | Status: DC
  Administered 2019-12-14 – 2019-12-19 (×8): 3.125 mg via ORAL
  Filled 2019-12-14 (×10): qty 1

## 2019-12-14 MED ORDER — SORBITOL 70 % SOLN: 30.0000 mL | Freq: Every day | Status: DC | PRN

## 2019-12-14 MED ORDER — ACETAMINOPHEN 160 MG/5ML PO SOLN
650.0000 mg | ORAL | Status: DC | PRN
  Administered 2019-12-22 – 2020-01-05 (×10): 650 mg
  Filled 2019-12-14 (×10): qty 20.3

## 2019-12-14 MED ORDER — ONDANSETRON HCL 4 MG PO TABS: 4.0000 mg | ORAL_TABLET | Freq: Three times a day (TID) | ORAL | Status: DC | PRN

## 2019-12-14 MED ORDER — BRIMONIDINE TARTRATE 0.2 % OP SOLN
1.0000 [drp] | Freq: Two times a day (BID) | OPHTHALMIC | Status: DC
  Administered 2019-12-14 – 2020-01-10 (×54): 1 [drp] via OPHTHALMIC
  Filled 2019-12-14: qty 5

## 2019-12-14 MED ORDER — DONEPEZIL HCL 10 MG PO TABS
10.0000 mg | ORAL_TABLET | Freq: Every day | ORAL | Status: DC
  Administered 2019-12-14 – 2019-12-19 (×5): 10 mg via ORAL
  Filled 2019-12-14 (×5): qty 1

## 2019-12-14 MED ORDER — SODIUM CHLORIDE 0.9 % IV SOLN: 1.0000 g | Freq: Two times a day (BID) | INTRAVENOUS | Status: DC

## 2019-12-14 MED ORDER — INSULIN GLARGINE 100 UNIT/ML ~~LOC~~ SOLN
10.0000 [IU] | Freq: Two times a day (BID) | SUBCUTANEOUS | Status: DC
  Administered 2019-12-14: 10 [IU] via SUBCUTANEOUS
  Filled 2019-12-14 (×3): qty 0.1

## 2019-12-14 MED ORDER — LATANOPROST 0.005 % OP SOLN: 1.0000 [drp] | Freq: Every day | OPHTHALMIC | Status: DC

## 2019-12-14 MED ORDER — INSULIN ASPART 100 UNIT/ML ~~LOC~~ SOLN: 0.0000 [IU] | Freq: Every day | SUBCUTANEOUS | Status: DC

## 2019-12-14 MED ORDER — FERROUS SULFATE 325 (65 FE) MG PO TABS
325.0000 mg | ORAL_TABLET | Freq: Every day | ORAL | Status: DC
  Administered 2019-12-15 – 2019-12-17 (×3): 325 mg via ORAL
  Filled 2019-12-14 (×5): qty 1

## 2019-12-14 MED ORDER — INSULIN ASPART 100 UNIT/ML ~~LOC~~ SOLN
2.0000 [IU] | Freq: Three times a day (TID) | SUBCUTANEOUS | Status: DC
  Administered 2019-12-14: 2 [IU] via SUBCUTANEOUS

## 2019-12-14 MED ORDER — ASPIRIN EC 81 MG PO TBEC
81.0000 mg | DELAYED_RELEASE_TABLET | Freq: Every day | ORAL | Status: DC
  Administered 2019-12-15 – 2019-12-17 (×3): 81 mg via ORAL
  Filled 2019-12-14 (×5): qty 1

## 2019-12-14 MED ORDER — IPRATROPIUM-ALBUTEROL 0.5-2.5 (3) MG/3ML IN SOLN
3.0000 mL | Freq: Four times a day (QID) | RESPIRATORY_TRACT | Status: DC | PRN
  Filled 2019-12-14: qty 3

## 2019-12-14 MED ORDER — CEPHALEXIN 500 MG PO CAPS: 500.0000 mg | ORAL_CAPSULE | Freq: Four times a day (QID) | ORAL | 0 refills | Status: DC

## 2019-12-14 MED ORDER — PANTOPRAZOLE SODIUM 40 MG PO TBEC
40.0000 mg | DELAYED_RELEASE_TABLET | Freq: Every day | ORAL | Status: DC
  Administered 2019-12-15 – 2019-12-20 (×5): 40 mg via ORAL
  Filled 2019-12-14 (×5): qty 1

## 2019-12-14 MED ORDER — INSULIN GLARGINE 100 UNIT/ML ~~LOC~~ SOLN
10.0000 [IU] | Freq: Two times a day (BID) | SUBCUTANEOUS | Status: DC
  Administered 2019-12-14: 10 [IU] via SUBCUTANEOUS
  Filled 2019-12-14 (×2): qty 0.1

## 2019-12-14 MED ORDER — INSULIN ASPART 100 UNIT/ML ~~LOC~~ SOLN
0.0000 [IU] | Freq: Three times a day (TID) | SUBCUTANEOUS | Status: DC
  Administered 2019-12-14: 2 [IU] via SUBCUTANEOUS
  Administered 2019-12-15 (×2): 5 [IU] via SUBCUTANEOUS
  Administered 2019-12-15 – 2019-12-16 (×3): 2 [IU] via SUBCUTANEOUS
  Administered 2019-12-16: 13:00:00 3 [IU] via SUBCUTANEOUS
  Administered 2019-12-17 – 2019-12-18 (×4): 2 [IU] via SUBCUTANEOUS

## 2019-12-14 MED ORDER — DORZOLAMIDE HCL 2 % OP SOLN
1.0000 [drp] | Freq: Three times a day (TID) | OPHTHALMIC | Status: DC
  Administered 2019-12-14 – 2020-01-10 (×78): 1 [drp] via OPHTHALMIC
  Filled 2019-12-14: qty 10

## 2019-12-14 MED ORDER — INSULIN ASPART 100 UNIT/ML ~~LOC~~ SOLN
2.0000 [IU] | Freq: Three times a day (TID) | SUBCUTANEOUS | Status: DC
  Administered 2019-12-14 – 2019-12-17 (×6): 2 [IU] via SUBCUTANEOUS

## 2019-12-14 NOTE — Progress Notes (Addendum)
Patient arrived to unit with her belongings- clothes, sneakers, and cane. Alert and oriented x 2, disoriented to date and situation. No c/o pain. Admission completed with daughter, [name] via phone. Policies and procedures reviewed with patient and daughter. Patient's daughter said patient is hard of hearing. Patient is suppose to have her 2nd covid vaccine tomorrow per daughter.

## 2019-12-14 NOTE — Progress Notes (Signed)
Jamse Arn, MD  Physician  Physical Medicine and Rehabilitation  Consult Note  Signed  Date of Service:  12/08/2019  5:04 AM      Related encounter: ED to Hosp-Admission (Discharged) from 12/06/2019 in [place] [facility]      Signed      Expand AllCollapse All   Show:Clear all [x] Manual[x] Template[] Copied  Added by: [x] Angiulli, Lavon Paganini, PA-C[x] Jamse Arn, MD  [] Hover for details          Physical Medicine and Rehabilitation Consult Reason for Consult: Slurred speech and left-sided weakness Referring Physician: [facility]     HPI: [name] is a 86 y.o. right-handed female with history of CAD with stenting, CKD stage II with creatinine 1.56, CVA maintained on Plavix, memory loss maintained on Aricept, heart block with pacemaker, hypertension, type 2 diabetes mellitus, OSA with CPAP, chronic diastolic congestive heart failure.  History taken from chart review due to cognition.  Patient lives with family.  1 level home.  Used a straight point cane for mobility daughter assist with bathing.  She presented on 12/07/2019 after a fall without LOC.  She was noted to have slurred speech and left hemiparesis.  Cranial CT unremarkable for acute intracranial process.  Patient did not receive TPA.  MRI not completed due to pacemaker.  Echocardiogram showed ejection fraction of 55% without emboli.  Neurology follow-up currently remains on Plavix as prior to admission.  Subcutaneous Lovenox for DVT prophylaxis.  Admission chemistries with creatinine 1.62, SARS coronavirus negative.  Tolerating a regular diet.  Therapy evaluations completed with recommendations of physical medicine rehab consult.     Review of Systems  Unable to perform ROS: Mental acuity        Past Medical History:  Diagnosis Date  . Anemia    . Arthritis    . Celiac disease    . Chronic systolic CHF (congestive heart failure) ([place])    . Complete heart block ([place])    . Coronary artery  disease    . Diabetes mellitus      INSULIN DEPENDENT  . GERD (gastroesophageal reflux disease)    . Glaucoma    . Headache(784.0)    . Heart disease    . Hypercholesterolemia    . Hypertension    . Iron deficiency anemia, unspecified    . Ischemic cardiomyopathy    . Memory difficulties 04/14/2019  . Shortness of breath    . Sleep apnea      uses cpap  . Stroke ([place])    . Unspecified constipation           Past Surgical History:  Procedure Laterality Date  . BACK SURGERY      . BI-VENTRICULAR PACEMAKER INSERTION (CRT-P)   March 2014    [facility]  . BIV PACEMAKER GENERATOR CHANGEOUT N/A 06/29/2019    Procedure: BIV PACEMAKER GENERATOR CHANGEOUT;  Surgeon: Evans Lance, MD;  Location: [place] CV LAB;  Service: Cardiovascular;  Laterality: N/A;  . CARDIAC CATHETERIZATION   09/02/2012  . CARDIAC SURGERY      . CORONARY ANGIOPLASTY WITH STENT PLACEMENT   09/02/2012    RCA  . PERCUTANEOUS CORONARY STENT INTERVENTION (PCI-S) N/A 09/02/2012    Procedure: PERCUTANEOUS CORONARY STENT INTERVENTION (PCI-S);  Surgeon: Clent Demark, MD;  Location: [facility] CATH LAB;  Service: Cardiovascular;  Laterality: N/A;  . PERMANENT PACEMAKER INSERTION N/A 11/29/2012    Procedure: PERMANENT PACEMAKER INSERTION;  Surgeon: Deboraha Sprang, MD;  Location: [facility]  CATH LAB;  Service: Cardiovascular;  Laterality: N/A;  . TEMPORARY PACEMAKER INSERTION N/A 11/28/2012    Procedure: TEMPORARY PACEMAKER INSERTION;  Surgeon: Clent Demark, MD;  Location: [facility] CATH LAB;  Service: Cardiovascular;  Laterality: N/A;         Family History  Problem Relation Age of Onset  . Diabetes Mother    . Hypertension Mother    . Hyperlipidemia Mother    . Cancer Sister    . Dementia Sister    . Neuropathy Sister      Social History:  reports that she quit smoking about 42 years ago. She has never used smokeless tobacco. She reports that she does not drink alcohol or use drugs. Allergies: No Known Allergies        Medications Prior to Admission  Medication Sig Dispense Refill  . amLODipine (NORVASC) 2.5 MG tablet Take 2.5 mg by mouth every morning.      Marland Kitchen atorvastatin (LIPITOR) 80 MG tablet Take 80 mg by mouth every morning.    5  . brimonidine-timolol (COMBIGAN) 0.2-0.5 % ophthalmic solution Place 1 drop into both eyes 2 (two) times daily.      . calcitRIOL (ROCALTROL) 0.25 MCG capsule Take 0.25 mcg by mouth every morning.      . carvedilol (COREG) 3.125 MG tablet Take 3.125 mg by mouth 2 (two) times daily.   3  . clopidogrel (PLAVIX) 75 MG tablet Take 1 tablet (75 mg total) by mouth daily. 30 tablet 0  . Cod Liver Oil CAPS Take 1 capsule by mouth at bedtime.       . donepezil (ARICEPT) 10 MG tablet Take 10 mg by mouth at bedtime.       . dorzolamide (TRUSOPT) 2 % ophthalmic solution Place 1 drop into both eyes 2 (two) times daily.       . ferrous sulfate 325 (65 FE) MG tablet Take 325 mg by mouth daily with breakfast.      . insulin degludec (TRESIBA FLEXTOUCH) 100 UNIT/ML SOPN FlexTouch Pen Inject 10-20 Units into the skin See admin instructions. Inject 10-20 units into the skin at bedtime, based upon CGB (home capillary blood glucose)      . Insulin Lispro (HUMALOG KWIKPEN) 200 UNIT/ML SOPN Inject 6 Units into the skin 3 (three) times daily as needed (high blood sugar).       Marland Kitchen latanoprost (XALATAN) 0.005 % ophthalmic solution Appointment OVERDUE, place 1 drop into both eyes daily at bedtime (Patient taking differently: Place 1 drop into both eyes at bedtime. ) 2.5 mL 0  . linagliptin (TRADJENTA) 5 MG TABS tablet Take 5 mg by mouth every morning.      . loperamide (IMODIUM) 2 MG capsule Take 2 mg by mouth as needed for diarrhea or loose stools.      . Multiple Vitamin (MULTIVITAMIN WITH MINERALS) TABS Take 1 tablet by mouth at bedtime.       . Multiple Vitamins-Minerals (EYE VITAMINS) CAPS Take 1 capsule by mouth every morning.      . nitroGLYCERIN (NITROSTAT) 0.4 MG SL tablet Place 0.4 mg under the  tongue every 5 (five) minutes as needed for chest pain.       . pantoprazole (PROTONIX) 40 MG tablet Take 40 mg by mouth daily before breakfast.       . valsartan-hydrochlorothiazide (DIOVAN-HCT) 320-12.5 MG tablet Take 1 tablet by mouth every morning.       Marland Kitchen NOVOFINE 32G X 6 MM MISC  Home: Home Living Family/patient expects to be discharged to:: Private residence Living Arrangements: Other (Comment)(granddaughter, daughter next door) Available Help at Discharge: Family Type of Home: House Home Access: Level entry [place]: One level Bathroom Shower/Tub: Chiropodist: [place]: Radio producer - single point, Engineer, civil (consulting), Shower seat  Lives With: Son, Daughter  Functional History: Prior Function Level of Independence: Needs assistance Gait / Transfers Assistance Needed: pt reports use of SPC for mobility ADL's / Homemaking Assistance Needed: pt's daughter assists with bathing Comments: aide M-F 8 hours a day (? pt difficulty reporting time) helps with BADL Functional Status:  Mobility: Bed Mobility Overal bed mobility: Needs Assistance Bed Mobility: Supine to Sit Supine to sit: Min assist General bed mobility comments: min A to EOB to facilitate upright position and cues to scoot EOB for increased sitting balance and stability Transfers Overall transfer level: Needs assistance Equipment used: Rolling walker (2 wheeled) Transfers: Sit to/from Stand Sit to Stand: Min assist, +2 physical assistance, +2 safety/equipment General transfer comment: min A +2 for safety with RW and hand placement, pt with R knee buckle Ambulation/Gait Ambulation/Gait assistance: Mod assist, +2 physical assistance Gait Distance (Feet): 8 Feet Assistive device: Rolling walker (2 wheeled) Gait Pattern/deviations: Step-through pattern, Decreased stride length, Trunk flexed General Gait Details: Pt with increased trunk flexion, cues provided for upright posture,  stepping initiation. R knee buckle with fatigue, necessitating chair to be pulled up behind her. Gait velocity: decreased   ADL: ADL Overall ADL's : Needs assistance/impaired Eating/Feeding: Set up, Sitting Grooming: Set up, Sitting Grooming Details (indicate cue type and reason): attempted to stand, pt could not sustain with R knee buckle and needing to sit Upper Body Bathing: Minimal assistance, Sitting Lower Body Bathing: Minimal assistance, Sit to/from stand, Sitting/lateral leans Upper Body Dressing : Minimal assistance, Sitting Lower Body Dressing: Minimal assistance, Sit to/from stand, Sitting/lateral leans Lower Body Dressing Details (indicate cue type and reason): increased time and effort to don socks, min support to keep legs crossed when donning B socks Toilet Transfer: Minimal assistance, +2 for physical assistance, +2 for safety/equipment, RW Toilet Transfer Details (indicate cue type and reason): for safety, R knee buckle Toileting- Clothing Manipulation and Hygiene: Minimal assistance, Sit to/from stand Functional mobility during ADLs: Minimal assistance, +2 for physical assistance, +2 for safety/equipment, Rolling walker General ADL Comments: pt limited by generalized weakness, increased fall risk, R knee buckle, and cognitive deficits for engagement in BADL   Cognition: Cognition Overall Cognitive Status: History of cognitive impairments - at baseline Arousal/Alertness: Awake/alert Orientation Level: Oriented to person, Oriented to place, Oriented to situation, Disoriented to time Attention: Sustained Sustained Attention: Appears intact Memory: Impaired Memory Impairment: Prospective memory, Decreased short term memory Decreased Short Term Memory: Verbal basic Cognition Arousal/Alertness: Awake/alert Behavior During Therapy: WFL for tasks assessed/performed Overall Cognitive Status: History of cognitive impairments - at baseline Area of Impairment: Memory,  Safety/judgement, Awareness, Problem solving Memory: Decreased short-term memory Safety/Judgement: Decreased awareness of safety, Decreased awareness of deficits Awareness: Emergent Problem Solving: Requires verbal cues General Comments: pt requiring cues and support for multistep tasks for safety   Blood pressure (!) 140/59, pulse 79, temperature 98.2 F (36.8 C), temperature source Oral, resp. rate 17, weight 78.8 kg, SpO2 97 %. Physical Exam  Vitals reviewed. Constitutional: She appears well-developed and well-nourished.  HENT:  Head: Normocephalic and atraumatic.  Eyes: Right eye exhibits no discharge. Left eye exhibits no discharge.  Extraocular movements appear to be intact, however?  Left gaze  preference  Neck: No tracheal deviation present. No thyromegaly present.  Respiratory: Effort normal. No respiratory distress.  GI: Soft. She exhibits no distension.  Musculoskeletal:     Comments: No edema or tenderness in extremities  Neurological: She is alert.  Unable to answer orientation questions, confused Expressive >receptive aphasia Motor: Freely moving left side RUE: 3 -/5 proximal distal RLE: 2+-3-/5 proximal to distal  Skin: Skin is warm and dry.  Psychiatric: Her affect is blunt. Her speech is delayed and slurred. She is slowed.      Lab Results Last 24 Hours       Results for orders placed or performed during the hospital encounter of 12/06/19 (from the past 24 hour(s))  Glucose, capillary     Status: Abnormal    Collection Time: 12/07/19  6:19 AM  Result Value Ref Range    Glucose-Capillary 142 (H) 70 - 99 mg/dL  CBC with Differential/Platelet     Status: Abnormal    Collection Time: 12/07/19  7:48 AM  Result Value Ref Range    WBC 6.8 4.0 - 10.5 K/uL    RBC 3.75 (L) 3.87 - 5.11 MIL/uL    Hemoglobin 10.8 (L) 12.0 - 15.0 g/dL    HCT 34.9 (L) 36.0 - 46.0 %    MCV 93.1 80.0 - 100.0 fL    MCH 28.8 26.0 - 34.0 pg    MCHC 30.9 30.0 - 36.0 g/dL    RDW 12.4 11.5 -  15.5 %    Platelets 199 150 - 400 K/uL    nRBC 0.0 0.0 - 0.2 %    Neutrophils Relative % 41 %    Neutro Abs 2.8 1.7 - 7.7 K/uL    Lymphocytes Relative 50 %    Lymphs Abs 3.4 0.7 - 4.0 K/uL    Monocytes Relative 8 %    Monocytes Absolute 0.6 0.1 - 1.0 K/uL    Eosinophils Relative 1 %    Eosinophils Absolute 0.1 0.0 - 0.5 K/uL    Basophils Relative 0 %    Basophils Absolute 0.0 0.0 - 0.1 K/uL    Immature Granulocytes 0 %    Abs Immature Granulocytes 0.01 0.00 - 0.07 K/uL  Basic metabolic panel     Status: Abnormal    Collection Time: 12/07/19  7:48 AM  Result Value Ref Range    Sodium 141 135 - 145 mmol/L    Potassium 3.9 3.5 - 5.1 mmol/L    Chloride 103 98 - 111 mmol/L    CO2 25 22 - 32 mmol/L    Glucose, Bld 152 (H) 70 - 99 mg/dL    BUN 17 8 - 23 mg/dL    Creatinine, Ser 1.24 (H) 0.44 - 1.00 mg/dL    Calcium 10.7 (H) 8.9 - 10.3 mg/dL    GFR calc non Af Amer 38 (L) >60 mL/min    GFR calc Af Amer 45 (L) >60 mL/min    Anion gap 13 5 - 15  Glucose, capillary     Status: Abnormal    Collection Time: 12/07/19  8:31 AM  Result Value Ref Range    Glucose-Capillary 143 (H) 70 - 99 mg/dL  Glucose, capillary     Status: Abnormal    Collection Time: 12/07/19 11:50 AM  Result Value Ref Range    Glucose-Capillary 223 (H) 70 - 99 mg/dL  Glucose, capillary     Status: Abnormal    Collection Time: 12/07/19  4:47 PM  Result Value Ref Range  Glucose-Capillary 125 (H) 70 - 99 mg/dL  Glucose, capillary     Status: Abnormal    Collection Time: 12/07/19  9:14 PM  Result Value Ref Range    Glucose-Capillary 129 (H) 70 - 99 mg/dL    Comment 1 Notify RN      Comment 2 Document in Chart    Urinalysis, Routine w reflex microscopic     Status: Abnormal    Collection Time: 12/07/19  9:30 PM  Result Value Ref Range    Color, Urine STRAW (A) YELLOW    APPearance CLEAR CLEAR    Specific Gravity, Urine 1.005 1.005 - 1.030    pH 6.0 5.0 - 8.0    Glucose, UA NEGATIVE NEGATIVE mg/dL    Hgb urine  dipstick NEGATIVE NEGATIVE    Bilirubin Urine NEGATIVE NEGATIVE    Ketones, ur NEGATIVE NEGATIVE mg/dL    Protein, ur NEGATIVE NEGATIVE mg/dL    Nitrite NEGATIVE NEGATIVE    Leukocytes,Ua MODERATE (A) NEGATIVE    RBC / HPF 0-5 0 - 5 RBC/hpf    WBC, UA 0-5 0 - 5 WBC/hpf    Bacteria, UA RARE (A) NONE SEEN    Squamous Epithelial / LPF 0-5 0 - 5  CBC with Differential/Platelet     Status: Abnormal    Collection Time: 12/08/19  3:48 AM  Result Value Ref Range    WBC 6.5 4.0 - 10.5 K/uL    RBC 3.55 (L) 3.87 - 5.11 MIL/uL    Hemoglobin 10.2 (L) 12.0 - 15.0 g/dL    HCT 31.8 (L) 36.0 - 46.0 %    MCV 89.6 80.0 - 100.0 fL    MCH 28.7 26.0 - 34.0 pg    MCHC 32.1 30.0 - 36.0 g/dL    RDW 12.5 11.5 - 15.5 %    Platelets 217 150 - 400 K/uL    nRBC 0.0 0.0 - 0.2 %    Neutrophils Relative % 42 %    Neutro Abs 2.7 1.7 - 7.7 K/uL    Lymphocytes Relative 48 %    Lymphs Abs 3.1 0.7 - 4.0 K/uL    Monocytes Relative 9 %    Monocytes Absolute 0.6 0.1 - 1.0 K/uL    Eosinophils Relative 1 %    Eosinophils Absolute 0.1 0.0 - 0.5 K/uL    Basophils Relative 0 %    Basophils Absolute 0.0 0.0 - 0.1 K/uL    Immature Granulocytes 0 %    Abs Immature Granulocytes 0.01 0.00 - 0.07 K/uL  Basic metabolic panel     Status: Abnormal    Collection Time: 12/08/19  3:48 AM  Result Value Ref Range    Sodium 137 135 - 145 mmol/L    Potassium 3.9 3.5 - 5.1 mmol/L    Chloride 103 98 - 111 mmol/L    CO2 25 22 - 32 mmol/L    Glucose, Bld 168 (H) 70 - 99 mg/dL    BUN 19 8 - 23 mg/dL    Creatinine, Ser 1.26 (H) 0.44 - 1.00 mg/dL    Calcium 10.9 (H) 8.9 - 10.3 mg/dL    GFR calc non Af Amer 38 (L) >60 mL/min    GFR calc Af Amer 44 (L) >60 mL/min    Anion gap 9 5 - 15       Imaging Results (Last 48 hours)  DG Chest 2 View   Result Date: 12/07/2019 CLINICAL DATA:  Frequent falls. EXAM: CHEST - 2 VIEW COMPARISON:  08/07/2015 FINDINGS:  Moderate thoracic spondylosis. Pacer with leads at right atrium and right  ventricle. Patient rotated right on the frontal. Normal heart size. No pleural effusion or pneumothorax. Clear lungs. IMPRESSION: No acute cardiopulmonary disease. Electronically Signed   By: Abigail Miyamoto M.D.   On: 12/07/2019 09:40    DG Knee 1-2 Views Left   Result Date: 12/07/2019 CLINICAL DATA:  Knee pain, bilateral EXAM: LEFT KNEE - 1-2 VIEW COMPARISON:  10/16/2012 FINDINGS: Tricompartmental osteoarthritic change with complete loss of the joint space in the medial compartment. No signs of acute fracture. Small joint effusion. IMPRESSION: Tricompartmental osteoarthritic change greatest in medial compartment with small joint effusion. Electronically Signed   By: Zetta Bills M.D.   On: 12/07/2019 20:10    DG Knee 1-2 Views Right   Result Date: 12/07/2019 CLINICAL DATA:  Bilateral knee plain select plain pain EXAM: RIGHT KNEE - 1-2 VIEW COMPARISON:  October 16, 2012 FINDINGS: Marked tricompartmental osteoarthritic change greatest in medial compartment with complete loss of the joint space and marginal osteophytes, worse than in 2013. Osteopenia. No acute fracture. Postoperative changes about the medial left thigh and posterior lower extremity may reflect changes of previous graft harvest. IMPRESSION: 1. Marked tricompartmental osteoarthritic change greatest in the medial compartment of the right knee, worse than in 2013. 2. Osteopenia. Electronically Signed   By: Zetta Bills M.D.   On: 12/07/2019 20:08    CT HEAD WO CONTRAST   Result Date: 12/07/2019 CLINICAL DATA:  Stroke follow-up EXAM: CT HEAD WITHOUT CONTRAST TECHNIQUE: Contiguous axial images were obtained from the base of the skull through the vertex without intravenous contrast. COMPARISON:  Head CT December 06, 2019 FINDINGS: Brain: No evidence of acute infarction, hemorrhage, hydrocephalus, extra-axial collection or mass lesion/mass effect. Confluent hypodensity in the periventricular white matter, nonspecific, most likely related to  chronic small vessel ischemia, unchanged. Prominence of the supratentorial ventricles and cerebral sulci reflecting parenchymal volume loss. Vascular: No hyperdense vessel or unexpected calcification. Skull: Normal. Negative for fracture or focal lesion. Sinuses/Orbits: Bilateral ocular implants (GDD). Other: None. IMPRESSION: No acute intracranial abnormality. No significant change from prior. Electronically Signed   By: Pedro Earls M.D.   On: 12/07/2019 15:00    CT HEAD WO CONTRAST   Result Date: 12/06/2019 CLINICAL DATA:  Possible stroke EXAM: CT HEAD WITHOUT CONTRAST TECHNIQUE: Contiguous axial images were obtained from the base of the skull through the vertex without intravenous contrast. COMPARISON:  CT head 11/07/2019 FINDINGS: Brain: Moderate atrophy. Mild chronic microvascular ischemic changes in the white matter. Negative for acute infarct, hemorrhage, mass.  No midline shift. Vascular: Negative for hyperdense vessel. Atherosclerotic calcification. Skull: Negative Sinuses/Orbits: Paranasal sinuses show mild mucosal edema. Bilateral ocular surgery Other: None IMPRESSION: Atrophy and chronic microvascular ischemia. No acute intracranial abnormality. Electronically Signed   By: Franchot Gallo M.D.   On: 12/06/2019 19:10    ECHOCARDIOGRAM COMPLETE   Result Date: 12/07/2019    ECHOCARDIOGRAM REPORT   Patient Name:   HOLLYANN PABLO Date of Exam: 12/07/2019 Medical Rec #:  [id number]         Height:       64.0 in Accession #:    [id number]        Weight:       173.7 lb Date of Birth:  17-Sep-1928         BSA:          1.84 m Patient Age:    61 years  BP:           128/56 mmHg Patient Gender: F                 HR:           77 bpm. Exam Location:  Inpatient Procedure: 2D Echo Indications:    weakness  History:        Patient has prior history of Echocardiogram examinations, most                 recent 07/02/2019. Cardiomyopathy and CHF, CAD, Pacemaker,                 Stroke; Risk  Factors:Hypertension, Dyslipidemia, Diabetes and                 Former Smoker. Complete heart block.  Sonographer:    Jannett Celestine RDCS (AE) Referring Phys: [id number] [place] T TU  Sonographer Comments: Suboptimal apical window and suboptimal subcostal window. limited mobility. patient unable to turn entirely on left side (partially). apical windows obtained supine. IMPRESSIONS  1. Left ventricular ejection fraction, by estimation, is 50 to 55%. The left ventricle has low normal function. The left ventricle has no regional wall motion abnormalities. There is mild concentric left ventricular hypertrophy. Left ventricular diastolic parameters are indeterminate.  2. Right ventricular systolic function is mildly reduced. The right ventricular size is normal. There is severely elevated pulmonary artery systolic pressure.  3. The mitral valve is degenerative. Mild mitral valve regurgitation.  4. The aortic valve is tricuspid. Aortic valve regurgitation is not visualized. No aortic stenosis is present. FINDINGS  Left Ventricle: Left ventricular ejection fraction, by estimation, is 50 to 55%. The left ventricle has low normal function. The left ventricle has no regional wall motion abnormalities. The left ventricular internal cavity size was normal in size. There is mild concentric left ventricular hypertrophy. Left ventricular diastolic parameters are indeterminate. Right Ventricle: The right ventricular size is normal. No increase in right ventricular wall thickness. Right ventricular systolic function is mildly reduced. There is severely elevated pulmonary artery systolic pressure. The tricuspid regurgitant velocity is 2.23 m/s, and with an assumed right atrial pressure of 87 mmHg, the estimated right ventricular systolic pressure is 657.8 mmHg. Left Atrium: Left atrial size was not well visualized. Right Atrium: Right atrial size was not well visualized. Pericardium: There is no evidence of pericardial effusion. Mitral  Valve: The mitral valve is degenerative in appearance. There is mild thickening of the mitral valve leaflet(s). There is mild calcification of the mitral valve leaflet(s). Mild mitral valve regurgitation. Tricuspid Valve: The tricuspid valve is normal in structure. Tricuspid valve regurgitation is mild . No evidence of tricuspid stenosis. Aortic Valve: The aortic valve is tricuspid. . There is severe thickening and severe calcifcation of the aortic valve. Aortic valve regurgitation is not visualized. No aortic stenosis is present. There is severe thickening of the aortic valve. There is severe calcifcation of the aortic valve. Pulmonic Valve: The pulmonic valve was grossly normal. Pulmonic valve regurgitation is not visualized. No evidence of pulmonic stenosis. Aorta: The aortic root is normal in size and structure. Venous: The inferior vena cava was not well visualized. IAS/Shunts: The atrial septum is grossly normal. Additional Comments: A pacer wire is visualized.  LEFT VENTRICLE PLAX 2D LVIDd:         3.10 cm  Diastology LVIDs:         2.53 cm  LV e' lateral:   4.00 cm/s LV PW:  1.20 cm  LV E/e' lateral: 19.4 LV IVS:        1.40 cm  LV e' medial:    4.00 cm/s LVOT diam:     2.00 cm  LV E/e' medial:  19.4 LV SV:         37.70 ml LV SV Index:   7.77 LVOT Area:     3.14 cm  RIGHT VENTRICLE RV S prime:     10.00 cm/s TAPSE (M-mode): 1.6 cm LEFT ATRIUM             Index       RIGHT ATRIUM           Index LA diam:        2.80 cm 1.52 cm/m  RA Area:     11.60 cm LA Vol (A2C):   35.3 ml 19.16 ml/m RA Volume:   21.00 ml  11.40 ml/m LA Vol (A4C):   32.6 ml 17.69 ml/m LA Biplane Vol: 36.2 ml 19.65 ml/m  AORTIC VALVE LVOT Vmax:   60.60 cm/s LVOT Vmean:  39.800 cm/s LVOT VTI:    0.120 m MITRAL VALVE               TRICUSPID VALVE MV Area (PHT): 2.29 cm    TR Peak grad:   19.9 mmHg MV Decel Time: 331 msec    TR Vmax:        223.00 cm/s MV E velocity: 77.60 cm/s                            SHUNTS                             Systemic VTI:  0.12 m                            Systemic Diam: 2.00 cm Buford Dresser MD Electronically signed by Buford Dresser MD Signature Date/Time: 12/07/2019/9:19:01 PM    Final     DG Hip Unilat W or Wo Pelvis 2-3 Views Right   Result Date: 12/06/2019 CLINICAL DATA:  86 year old female status post fall with right hip pain. EXAM: DG HIP (WITH OR WITHOUT PELVIS) 2-3V RIGHT COMPARISON:  Right hip series 12/15/2003. FINDINGS: Chronic fibroid uterus with dystrophic calcifications. Femoral heads are normally located. Hip joint spaces appear symmetric. The proximal right femur appears intact. The proximal left femur appear symmetric and grossly intact. No pelvic fracture is identified. Mild spurring at the pubic symphysis is chronic. Negative visible bowel gas pattern. Calcified femoral artery atherosclerosis. IMPRESSION: No acute fracture or dislocation identified about the right hip or pelvis. Electronically Signed   By: Genevie Ann M.D.   On: 12/06/2019 20:55       Assessment/Plan: Diagnosis: Likely left brain infarct Stroke: Continue secondary stroke prophylaxis and Risk Factor Modification listed below:   Antiplatelet therapy:   Blood Pressure Management:  Continue current medication with prn's with permisive HTN per primary team Statin Agent:   Diabetes management:   Right sided hemiparesis: fit for orthosis to prevent contractures as necessary (resting hand splint for day, wrist cock up splint at night, PRAFO, etc) PT/OT for mobility, ADL training  Labs independently reviewed.  Records reviewed and summated above.   1. Does the need for close, 24 hr/day medical supervision in concert with the patient's rehab needs make it unreasonable  for this patient to be served in a less intensive setting? Yes and Potentially 2. Co-Morbidities requiring supervision/potential complications: CAD, CKD stage II (repeat labs, avoid nephrotoxic medications), history of CVA, memory loss,,  heart block with pacemaker, HTN (monitor and provide prns in accordance with increased physical exertion and pain), type 2 DM (Monitor in accordance with exercise and adjust meds as necessary), OSA with CPAP (monitor daytime somnolence), CHF (Monitor in accordance with increased physical activity and avoid UE resistance excercises) 3. Due to bladder management, bowel management, safety, disease management, medication administration and patient education, does the patient require 24 hr/day rehab nursing? Yes 4. Does the patient require coordinated care of a physician, rehab nurse, therapy disciplines of PT/OT/SLP to address physical and functional deficits in the context of the above medical diagnosis(es)? Yes Addressing deficits in the following areas: balance, endurance, locomotion, strength, transferring, bathing, dressing, toileting, cognition, speech, language and psychosocial support 5. Can the patient actively participate in an intensive therapy program of at least 3 hrs of therapy per day at least 5 days per week? Yes 6. The potential for patient to make measurable gains while on inpatient rehab is excellent 7. Anticipated functional outcomes upon discharge from inpatient rehab are supervision and min assist  with PT, supervision and min assist with OT, supervision and min assist with SLP. 8. Estimated rehab length of stay to reach the above functional goals is: 14-18 days. 9. Anticipated discharge destination: Home 10. Overall Rehab/Functional Prognosis: good   RECOMMENDATIONS: This patient's condition is appropriate for continued rehabilitative care in the following setting: Likely CIR, will need to await completion of medical work-up. Patient has agreed to participate in recommended program. Potentially Note that insurance prior authorization may be required for reimbursement for recommended care.   Comment: Rehab Admissions Coordinator to follow up.   I have personally performed a face  to face diagnostic evaluation, including, but not limited to relevant history and physical exam findings, of this patient and developed relevant assessment and plan.  Additionally, I have reviewed and concur with the physician assistant's documentation above.    Delice Lesch, MD, ABPMR Lavon Paganini Angiulli, PA-C 12/08/2019        Revision History                     Routing History

## 2019-12-14 NOTE — TOC Transition Note (Signed)
Transition of Care [facility]) - CM/SW Discharge Note   Patient Details  Name: [name] MRN: [id number] Date of Birth: 07-12-1928  Transition [facility]) CM/SW Contact:  Angelita Ingles, RN Phone Number: 12/14/2019, 1:53 PM   Clinical Narrative:   Patient is discharging to inpatient rehab today. No further needs from [facility]     Final next level of care: IP Rehab Facility Barriers to Discharge: No Barriers Identified   Patient Goals and CMS Choice     Choice offered to / list presented to : Patient  Discharge Placement                       Discharge Plan and Services   Discharge Planning Services: CM Consult                                 Social Determinants of Health (SDOH) Interventions     Readmission Risk Interventions No flowsheet data found.

## 2019-12-14 NOTE — Plan of Care (Signed)
  Problem: Consults Goal: RH STROKE PATIENT EDUCATION Description: See Patient Education module for education specifics  Outcome: Progressing Goal: Nutrition Consult-if indicated Outcome: Progressing Goal: Diabetes Guidelines if Diabetic/Glucose > 140 Description: If diabetic or lab glucose is > 140 mg/dl - Initiate Diabetes/Hyperglycemia Guidelines & Document Interventions  Outcome: Progressing   Problem: RH BOWEL ELIMINATION Goal: RH STG MANAGE BOWEL WITH ASSISTANCE Description: STG Manage Bowel with Moderate Assistance. Outcome: Progressing   Problem: RH BLADDER ELIMINATION Goal: RH STG MANAGE BLADDER WITH ASSISTANCE Description: STG Manage Bladder With Moderate Assistance Outcome: Progressing   Problem: RH SKIN INTEGRITY Goal: RH STG SKIN FREE OF INFECTION/BREAKDOWN Description: Patient will verbalize how to prevent skin breakdown during hospitalization. Outcome: Progressing Goal: RH STG MAINTAIN SKIN INTEGRITY WITH ASSISTANCE Description: STG Maintain Skin Integrity With Moderate Assistance. Outcome: Progressing   Problem: RH SAFETY Goal: RH STG ADHERE TO SAFETY PRECAUTIONS W/ASSISTANCE/DEVICE Description: STG Adhere to Safety Precautions With Moderate Assistance/Device. Outcome: Progressing   Problem: RH COGNITION-NURSING Goal: RH STG USES MEMORY AIDS/STRATEGIES W/ASSIST TO PROBLEM SOLVE Description: STG Uses Memory Aids/Strategies With Moderate Assistance to Problem Solve. Outcome: Progressing   Problem: RH PAIN MANAGEMENT Goal: RH STG PAIN MANAGED AT OR BELOW PT'S PAIN GOAL Description: <5 Outcome: Progressing   Problem: RH KNOWLEDGE DEFICIT Goal: RH STG INCREASE KNOWLEDGE OF DIABETES Outcome: Progressing Goal: RH STG INCREASE KNOWLEDGE OF HYPERTENSION Outcome: Progressing Goal: RH STG INCREASE KNOWLEDGE OF DYSPHAGIA/FLUID INTAKE Outcome: Progressing

## 2019-12-14 NOTE — Progress Notes (Signed)
Physical Therapy Treatment Patient Details Name: [name] MRN: [id number] DOB: Sep 12, 1928 Today's Date: 12/14/2019    History of Present Illness Pt is a 86 y.o. F with significant PMH of CHF, DM, CAD with pacemaker, HTN, prior stroke, who presents with several falls in the prior 24 hours and RLE weakness. CT negative for acute abnormality. Pt unable to receive MRI due to pacemaker.     PT Comments    Patient received in bed, looking lethargic. Agrees to PT session. Patient slow to respond to mobility direction. Requires mod +2 assist for bed mobility. Requires cues for upright posture with sitting on side of bed. Falling to her left or posteriorly. Patient requires max assist +2 for sit to stand transfers. Performed x3 reps. Patient requires assistance to position B LEs effectively and blocking of L foot during sit to stand transfer. Patient will continue to benefit from skilled PT while here to improve strength and functional independence.        Follow Up Recommendations  CIR     Equipment Recommendations  Wheelchair (measurements PT)    Recommendations for Other Services Rehab consult     Precautions / Restrictions Precautions Precautions: Fall Precaution Comments: R knee buckling and scissoring. Restrictions Weight Bearing Restrictions: No    Mobility  Bed Mobility Overal bed mobility: Needs Assistance Bed Mobility: Rolling;Sidelying to Sit;Sit to Sidelying;Sit to Supine;Supine to Sit Rolling: Mod assist;+2 for physical assistance Sidelying to sit: Min assist;+2 for physical assistance Supine to sit: Mod assist;+2 for physical assistance Sit to supine: Mod assist;+2 for physical assistance Sit to sidelying: Mod assist;+2 for physical assistance General bed mobility comments: patient tends to lean to her left when sitting. requires cues to push up to midline.  Transfers Overall transfer level: Needs assistance Equipment used: 2 person hand held  assist Transfers: Sit to/from Stand Sit to Stand: Max assist;+2 physical assistance         General transfer comment: patient performed sit to stand from bed x 3 reps with mod/max assist +2. Requires blocking of left foot during transfer.  Ambulation/Gait             General Gait Details: unable this day   Stairs             Wheelchair Mobility    Modified Rankin (Stroke Patients Only) Modified Rankin (Stroke Patients Only) Pre-Morbid Rankin Score: Moderate disability Modified Rankin: Moderately severe disability     Balance Overall balance assessment: Needs assistance Sitting-balance support: Feet supported;Single extremity supported Sitting balance-Leahy Scale: Poor Sitting balance - Comments: cues needed to maintain upright   Standing balance support: Bilateral upper extremity supported;During functional activity Standing balance-Leahy Scale: Zero Standing balance comment: Max assist +2 for sit to stand transfer                            Cognition Arousal/Alertness: Awake/alert Behavior During Therapy: WFL for tasks assessed/performed Overall Cognitive Status: No family/caregiver present to determine baseline cognitive functioning Area of Impairment: Memory;Safety/judgement;Awareness;Problem solving                     Memory: Decreased short-term memory   Safety/Judgement: Decreased awareness of safety;Decreased awareness of deficits Awareness: Emergent Problem Solving: Requires verbal cues;Requires tactile cues        Exercises      General Comments        Pertinent Vitals/Pain Pain Assessment: Faces Faces Pain Scale: Hurts a little  bit Pain Location: Legs with movement in and out of bed Pain Descriptors / Indicators: Grimacing;Moaning Pain Intervention(s): Monitored during session    Home Living   Living Arrangements: Other (Comment) Available Help at Discharge: Family;Available 24 hours/day(son, daughter and  aide to assist)                Prior Function            PT Goals (current goals can now be found in the care plan section) Acute Rehab PT Goals Patient Stated Goal: unable to state PT Goal Formulation: With patient Time For Goal Achievement: 12/21/19 Potential to Achieve Goals: Poor Progress towards PT goals: Not progressing toward goals - comment    Frequency    Min 3X/week      PT Plan Current plan remains appropriate    Co-evaluation              AM-PAC PT "6 Clicks" Mobility   Outcome Measure  Help needed turning from your back to your side while in a flat bed without using bedrails?: A Lot Help needed moving from lying on your back to sitting on the side of a flat bed without using bedrails?: A Lot Help needed moving to and from a bed to a chair (including a wheelchair)?: Total Help needed standing up from a chair using your arms (e.g., wheelchair or bedside chair)?: Total Help needed to walk in hospital room?: Total Help needed climbing 3-5 steps with a railing? : Total 6 Click Score: 8    End of Session Equipment Utilized During Treatment: Gait belt Activity Tolerance: Patient tolerated treatment well Patient left: with bed alarm set;in bed Nurse Communication: Mobility status PT Visit Diagnosis: Unsteadiness on feet (R26.81);History of falling (Z91.81);Difficulty in walking, not elsewhere classified (R26.2);Other abnormalities of gait and mobility (R26.89);Hemiplegia and hemiparesis;Muscle weakness (generalized) (M62.81) Hemiplegia - Right/Left: Right Hemiplegia - caused by: Cerebral infarction     Time: 0518-3358 PT Time Calculation (min) (ACUTE ONLY): 18 min  Charges:  $Therapeutic Activity: 8-22 mins                     Mackinzee Roszak, PT, GCS 12/14/19,1:01 PM

## 2019-12-14 NOTE — H&P (Signed)
Physical Medicine and Rehabilitation Admission H&P    Chief Complaint  Patient presents with  . Weakness  : HPI: [name] is an 86 year old right-handed female with history of CAD with stenting, CKD stage III with creatinine 1.56, CVA maintained on Plavix, memory loss maintained on Aricept followed by Dr. Jannifer Franklin, heart block with pacemaker, hypertension, type 2 diabetes mellitus, OSA with CPAP, chronic normocytic anemia, chronic diastolic congestive heart failure.  History taken from chart review due to cognition.  Patient lives with family.  1 level home.  Used a cane prior to admission for mobility and daughter assist with some basic ADLs.  She presented on 12/07/2019 after fall without LOC.  She was noted to have slurred speech and left hemiparesis.  Cranial CT scan unremarkable for acute intracranial process.  Patient did not receive TPA.  MRI not completed due to pacemaker.  Echocardiogram with ejection fraction of 55% without emboli.  Neurology follow-up currently maintained on aspirin and Plavix for CVA prophylaxis subcutaneous Lovenox for DVT prophylaxis.  Admission chemistries with creatinine 1.62, SARS coronavirus negative.  Hospital course further complicated by postop dysphagia, on a dysphagia 2 thin liquid diet.  Urinalysis study 12/12/2019 greater than 100,000 Citrobacter placed on Merrem 12/13/2019 for UTI.  Therapy evaluations completed and patient was admitted for a comprehensive rehab program.  Please see preadmission assessment from earlier today as well.  Review of Systems  Unable to perform ROS: Mental acuity   Past Medical History:  Diagnosis Date  . Anemia   . Arthritis   . Celiac disease   . Chronic systolic CHF (congestive heart failure) ([place])   . Complete heart block ([place])   . Coronary artery disease   . Diabetes mellitus    INSULIN DEPENDENT  . GERD (gastroesophageal reflux disease)   . Glaucoma   . Headache(784.0)   . Heart disease   .  Hypercholesterolemia   . Hypertension   . Iron deficiency anemia, unspecified   . Ischemic cardiomyopathy   . Memory difficulties 04/14/2019  . Shortness of breath   . Sleep apnea    uses cpap  . Stroke ([place])   . Unspecified constipation    Past Surgical History:  Procedure Laterality Date  . BACK SURGERY    . BI-VENTRICULAR PACEMAKER INSERTION (CRT-P)  March 2014   [facility]  . BIV PACEMAKER GENERATOR CHANGEOUT N/A 06/29/2019   Procedure: BIV PACEMAKER GENERATOR CHANGEOUT;  Surgeon: Evans Lance, MD;  Location: [facility];  Service: Cardiovascular;  Laterality: N/A;  . CARDIAC CATHETERIZATION  09/02/2012  . CARDIAC SURGERY    . CORONARY ANGIOPLASTY WITH STENT PLACEMENT  09/02/2012   RCA  . PERCUTANEOUS CORONARY STENT INTERVENTION (PCI-S) N/A 09/02/2012   Procedure: PERCUTANEOUS CORONARY STENT INTERVENTION (PCI-S);  Surgeon: Clent Demark, MD;  Location: [facility] CATH LAB;  Service: Cardiovascular;  Laterality: N/A;  . PERMANENT PACEMAKER INSERTION N/A 11/29/2012   Procedure: PERMANENT PACEMAKER INSERTION;  Surgeon: Deboraha Sprang, MD;  Location: [facility] CATH LAB;  Service: Cardiovascular;  Laterality: N/A;  . TEMPORARY PACEMAKER INSERTION N/A 11/28/2012   Procedure: TEMPORARY PACEMAKER INSERTION;  Surgeon: Clent Demark, MD;  Location: [facility] CATH LAB;  Service: Cardiovascular;  Laterality: N/A;   Family History  Problem Relation Age of Onset  . Diabetes Mother   . Hypertension Mother   . Hyperlipidemia Mother   . Cancer Sister   . Dementia Sister   . Neuropathy Sister    Social History:  reports that  she quit smoking about 42 years ago. She has never used smokeless tobacco. She reports that she does not drink alcohol or use drugs. Allergies: No Known Allergies Medications Prior to Admission  Medication Sig Dispense Refill  . amLODipine (NORVASC) 2.5 MG tablet Take 2.5 mg by mouth every morning.    Marland Kitchen atorvastatin (LIPITOR) 80 MG tablet Take 80 mg by mouth every morning.    5  . brimonidine-timolol (COMBIGAN) 0.2-0.5 % ophthalmic solution Place 1 drop into both eyes 2 (two) times daily.    . calcitRIOL (ROCALTROL) 0.25 MCG capsule Take 0.25 mcg by mouth every morning.    . carvedilol (COREG) 3.125 MG tablet Take 3.125 mg by mouth 2 (two) times daily.  3  . clopidogrel (PLAVIX) 75 MG tablet Take 1 tablet (75 mg total) by mouth daily. 30 tablet 0  . Cod Liver Oil CAPS Take 1 capsule by mouth at bedtime.     . donepezil (ARICEPT) 10 MG tablet Take 10 mg by mouth at bedtime.     . dorzolamide (TRUSOPT) 2 % ophthalmic solution Place 1 drop into both eyes 2 (two) times daily.     . ferrous sulfate 325 (65 FE) MG tablet Take 325 mg by mouth daily with breakfast.    . insulin degludec (TRESIBA FLEXTOUCH) 100 UNIT/ML SOPN FlexTouch Pen Inject 10-20 Units into the skin See admin instructions. Inject 10-20 units into the skin at bedtime, based upon CGB (home capillary blood glucose)    . Insulin Lispro (HUMALOG KWIKPEN) 200 UNIT/ML SOPN Inject 6 Units into the skin 3 (three) times daily as needed (high blood sugar).     Marland Kitchen latanoprost (XALATAN) 0.005 % ophthalmic solution Appointment OVERDUE, place 1 drop into both eyes daily at bedtime (Patient taking differently: Place 1 drop into both eyes at bedtime. ) 2.5 mL 0  . linagliptin (TRADJENTA) 5 MG TABS tablet Take 5 mg by mouth every morning.    . loperamide (IMODIUM) 2 MG capsule Take 2 mg by mouth as needed for diarrhea or loose stools.    . Multiple Vitamin (MULTIVITAMIN WITH MINERALS) TABS Take 1 tablet by mouth at bedtime.     . Multiple Vitamins-Minerals (EYE VITAMINS) CAPS Take 1 capsule by mouth every morning.    . nitroGLYCERIN (NITROSTAT) 0.4 MG SL tablet Place 0.4 mg under the tongue every 5 (five) minutes as needed for chest pain.     . pantoprazole (PROTONIX) 40 MG tablet Take 40 mg by mouth daily before breakfast.     . valsartan-hydrochlorothiazide (DIOVAN-HCT) 320-12.5 MG tablet Take 1 tablet by mouth every  morning.     Marland Kitchen NOVOFINE 32G X 6 MM MISC       Drug Regimen Review Drug regimen was reviewed and remains appropriate with no significant issues identified.  Home: Home Living Family/patient expects to be discharged to:: Inpatient rehab Living Arrangements: Other (Comment)(granddaughter; daughter next door) Available Help at Discharge: Family Type of Home: House Home Access: Level entry Home Layout: One level Bathroom Shower/Tub: Chiropodist: Standard Home Equipment: Radio producer - single point, Bedside commode, Shower seat  Lives With: Son, Daughter   Functional History: Prior Function Level of Independence: Needs assistance Gait / Transfers Assistance Needed: pt reports use of SPC for mobility ADL's / Homemaking Assistance Needed: pt's daughter assists with bathing Comments: aide M-F 8 hours a day (? pt difficulty reporting time) helps with BADL  Functional Status:  Mobility: Bed Mobility Overal bed mobility: Needs Assistance Bed Mobility: Rolling, Sidelying  to Sit Rolling: Mod assist, +2 for physical assistance Sidelying to sit: Mod assist, +2 for physical assistance Supine to sit: Mod assist General bed mobility comments: Pt required assistance to flex R knee into hooklying and push to roll into sidelying.  PTA provided Hand over Hand assistance with RUE.  HOB elevated to push into sitting.  Pt is slow and guarded with poor sitting balance.  She required total assistance to scoot hips forward. Transfers Overall transfer level: Needs assistance Equipment used: Ambulation equipment used, None(sara stedy) Transfer via Lift Equipment: Stedy Transfers: Sit to/from Stand, Risk manager Sit to Stand: Max assist, Total assist, +2 physical assistance Stand pivot transfers: Total assist, +2 physical assistance General transfer comment: Pt seated edge of bed on arrival and needed to toilet  She presents with decreased strength and poor balance and required total  assistance to move from bed  to bed side commode.  Pt then utilized sara stedy for remainder of transfers this session.  She required increased assistance on her R side to protect her RUE and maintain balance.  Strong R lateral lean noted  in sitting on sara stedy and in standing she presented with R forward lean with excessive hip flexion. Ambulation/Gait Ambulation/Gait assistance: (pt remains to decline functionally and gt remains unsafe at this time.) Gait Distance (Feet): 4 Feet(attempted steps from bed to recliner and she was increasingly unsteady with noted scissoring of RLE past midline.  She was able to take steps to recliner but then required recliner to be brought to her bottom as she became increasingly unsteady.) Assistive device: Rolling walker (2 wheeled) Gait Pattern/deviations: Step-through pattern, Decreased stride length, Trunk flexed, Scissoring General Gait Details: Pt with increased trunk flexion,  and strong R lateral lean.  She was unable to complete steps to recliner before requiring chair to be pulled up to her bottom. Gait velocity: decreased    ADL: ADL Overall ADL's : Needs assistance/impaired Eating/Feeding: Maximal assistance, With adaptive utensils Eating/Feeding Details (indicate cue type and reason): Set-up for eating, able to find adaptive fork (built up) however unable to load or bring to mouth Grooming: Set up, Sitting Grooming Details (indicate cue type and reason): attempted to stand, pt could not sustain with R knee buckle and needing to sit Upper Body Bathing: Minimal assistance, Sitting Lower Body Bathing: Total assistance, +2 for safety/equipment Lower Body Bathing Details (indicate cue type and reason): Completed in sitting, pt unable to bathe feet due to decreased balance Upper Body Dressing : Minimal assistance, Sitting Lower Body Dressing: Sitting/lateral leans, Total assistance Lower Body Dressing Details (indicate cue type and reason): put unable  to maintain balance to reach down to doff socks in sitting Toilet Transfer: +2 for physical assistance, Maximal assistance, Total assistance, Cueing for safety, Cueing for sequencing, BSC Toilet Transfer Details (indicate cue type and reason): Used stedy to transfer 2x to BSC, max to total A of 2 as session progressed due to fatigue Toileting- Clothing Manipulation and Hygiene: Cueing for safety, Cueing for sequencing, +2 for physical assistance, Total assistance Toileting - Clothing Manipulation Details (indicate cue type and reason): Total A for peri care in session, pt unable to maintain balance in stting Functional mobility during ADLs: Maximal assistance, Total assistance, +2 for physical assistance, +2 for safety/equipment, Cueing for sequencing, Cueing for safety General ADL Comments: Pt now requring stedy, plus 2 assist in order to complete ADLs, pt was noted to get progressively weaker as session continued  Cognition: Cognition Overall Cognitive Status: History  of cognitive impairments - at baseline Arousal/Alertness: Awake/alert Orientation Level: Oriented to person, Oriented to place, Disoriented to time, Oriented to situation Attention: Sustained Sustained Attention: Appears intact Memory: Impaired Memory Impairment: Prospective memory, Decreased short term memory Decreased Short Term Memory: Verbal basic Cognition Arousal/Alertness: Awake/alert Behavior During Therapy: WFL for tasks assessed/performed Overall Cognitive Status: History of cognitive impairments - at baseline Area of Impairment: Memory, Safety/judgement, Awareness, Problem solving Memory: Decreased short-term memory Safety/Judgement: Decreased awareness of safety, Decreased awareness of deficits Awareness: Emergent Problem Solving: Requires verbal cues General Comments: pt requiring cues and support for multistep tasks for safety  Physical Exam: Blood pressure 132/64, pulse 72, temperature 98.7 F (37.1 C),  temperature source Oral, resp. rate 18, height 5' 7"  (1.702 m), weight 78.8 kg, SpO2 93 %. Physical Exam  Vitals reviewed. Constitutional: She appears well-developed.  Obese  HENT:  Head: Normocephalic and atraumatic.  Eyes: Right eye exhibits no discharge. Left eye exhibits no discharge.  ?  Left gaze preference  Neck: No tracheal deviation present. No thyromegaly present.  Respiratory: Effort normal. No stridor. No respiratory distress.  GI: Soft. She exhibits no distension.  Musculoskeletal:     Comments: No edema or tenderness in extremities  Neurological: She is alert.  Follows simple commands with some inconsistency. Oriented to person and place Right facial weakness  Motor: RUE/RLE: 0/5 proximal distal No increase in tone appreciated LUE/LLE: Appears to be 5/5 proximal distal Sensation appears to be intact light touch Dysarthria Aphasia  Skin: Skin is warm and dry.  Psychiatric: Her affect is blunt. Her speech is delayed and slurred. She is slowed.    Results for orders placed or performed during the hospital encounter of 12/06/19 (from the past 48 hour(s))  Urinalysis, Routine w reflex microscopic     Status: Abnormal   Collection Time: 12/12/19 10:30 AM  Result Value Ref Range   Color, Urine YELLOW YELLOW   APPearance HAZY (A) CLEAR   Specific Gravity, Urine 1.015 1.005 - 1.030   pH 6.0 5.0 - 8.0   Glucose, UA NEGATIVE NEGATIVE mg/dL   Hgb urine dipstick NEGATIVE NEGATIVE   Bilirubin Urine NEGATIVE NEGATIVE   Ketones, ur NEGATIVE NEGATIVE mg/dL   Protein, ur NEGATIVE NEGATIVE mg/dL   Nitrite NEGATIVE NEGATIVE   Leukocytes,Ua SMALL (A) NEGATIVE    Comment: Performed at [facility] [address] [place], [place] [address]  Urinalysis, Microscopic (reflex)     Status: Abnormal   Collection Time: 12/12/19 10:30 AM  Result Value Ref Range   RBC / HPF NONE SEEN 0 - 5 RBC/hpf   WBC, UA 0-5 0 - 5 WBC/hpf   Bacteria, UA FEW (A) NONE SEEN   Squamous  Epithelial / LPF 0-5 0 - 5   Ca Oxalate Crys, UA PRESENT     Comment: Performed at [facility] [address] [place], [place] [address]  Glucose, capillary     Status: Abnormal   Collection Time: 12/12/19 12:30 PM  Result Value Ref Range   Glucose-Capillary 202 (H) 70 - 99 mg/dL  Glucose, capillary     Status: Abnormal   Collection Time: 12/12/19  3:39 PM  Result Value Ref Range   Glucose-Capillary 220 (H) 70 - 99 mg/dL  CBC with Differential/Platelet     Status: Abnormal   Collection Time: 12/13/19  3:38 AM  Result Value Ref Range   WBC 12.6 (H) 4.0 - 10.5 K/uL   RBC 3.51 (L) 3.87 - 5.11 MIL/uL  Hemoglobin 10.1 (L) 12.0 - 15.0 g/dL   HCT 32.2 (L) 36.0 - 46.0 %   MCV 91.7 80.0 - 100.0 fL   MCH 28.8 26.0 - 34.0 pg   MCHC 31.4 30.0 - 36.0 g/dL   RDW 12.7 11.5 - 15.5 %   Platelets 214 150 - 400 K/uL   nRBC 0.0 0.0 - 0.2 %   Neutrophils Relative % 72 %   Neutro Abs 9.1 (H) 1.7 - 7.7 K/uL   Lymphocytes Relative 18 %   Lymphs Abs 2.3 0.7 - 4.0 K/uL   Monocytes Relative 9 %   Monocytes Absolute 1.1 (H) 0.1 - 1.0 K/uL   Eosinophils Relative 1 %   Eosinophils Absolute 0.1 0.0 - 0.5 K/uL   Basophils Relative 0 %   Basophils Absolute 0.1 0.0 - 0.1 K/uL   Immature Granulocytes 0 %   Abs Immature Granulocytes 0.04 0.00 - 0.07 K/uL    Comment: Performed at [facility] [address], [place], [place] [address]  Basic metabolic panel     Status: Abnormal   Collection Time: 12/13/19  3:38 AM  Result Value Ref Range   Sodium 137 135 - 145 mmol/L   Potassium 4.5 3.5 - 5.1 mmol/L   Chloride 102 98 - 111 mmol/L   CO2 27 22 - 32 mmol/L   Glucose, Bld 230 (H) 70 - 99 mg/dL   BUN 21 8 - 23 mg/dL   Creatinine, Ser 1.21 (H) 0.44 - 1.00 mg/dL   Calcium 11.4 (H) 8.9 - 10.3 mg/dL   GFR calc non Af Amer 40 (L) >60 mL/min   GFR calc Af Amer 46 (L) >60 mL/min   Anion gap 8 5 - 15    Comment: Performed at [facility] [address], [place], [place] [address]  Glucose,  capillary     Status: Abnormal   Collection Time: 12/13/19  6:10 AM  Result Value Ref Range   Glucose-Capillary 200 (H) 70 - 99 mg/dL  Glucose, capillary     Status: Abnormal   Collection Time: 12/13/19 12:52 PM  Result Value Ref Range   Glucose-Capillary 178 (H) 70 - 99 mg/dL    Comment: Glucose reference range applies only to samples taken after fasting for at least 8 hours.  Glucose, capillary     Status: Abnormal   Collection Time: 12/13/19  4:05 PM  Result Value Ref Range   Glucose-Capillary 190 (H) 70 - 99 mg/dL    Comment: Glucose reference range applies only to samples taken after fasting for at least 8 hours.  Glucose, capillary     Status: Abnormal   Collection Time: 12/13/19  9:28 PM  Result Value Ref Range   Glucose-Capillary 329 (H) 70 - 99 mg/dL    Comment: Glucose reference range applies only to samples taken after fasting for at least 8 hours.   Comment 1 Notify RN    Comment 2 Document in Chart   CBC with Differential/Platelet     Status: Abnormal   Collection Time: 12/14/19  5:52 AM  Result Value Ref Range   WBC 10.7 (H) 4.0 - 10.5 K/uL   RBC 3.50 (L) 3.87 - 5.11 MIL/uL   Hemoglobin 10.1 (L) 12.0 - 15.0 g/dL   HCT 32.3 (L) 36.0 - 46.0 %   MCV 92.3 80.0 - 100.0 fL   MCH 28.9 26.0 - 34.0 pg   MCHC 31.3 30.0 - 36.0 g/dL   RDW 12.7 11.5 - 15.5 %  Platelets 212 150 - 400 K/uL   nRBC 0.0 0.0 - 0.2 %   Neutrophils Relative % 71 %   Neutro Abs 7.5 1.7 - 7.7 K/uL   Lymphocytes Relative 19 %   Lymphs Abs 2.0 0.7 - 4.0 K/uL   Monocytes Relative 10 %   Monocytes Absolute 1.0 0.1 - 1.0 K/uL   Eosinophils Relative 0 %   Eosinophils Absolute 0.0 0.0 - 0.5 K/uL   Basophils Relative 0 %   Basophils Absolute 0.0 0.0 - 0.1 K/uL   Immature Granulocytes 0 %   Abs Immature Granulocytes 0.04 0.00 - 0.07 K/uL    Comment: Performed at [facility] [address] [place], [place] [address]  Basic metabolic panel     Status: Abnormal   Collection Time: 12/14/19  5:52  AM  Result Value Ref Range   Sodium 142 135 - 145 mmol/L   Potassium 4.5 3.5 - 5.1 mmol/L   Chloride 105 98 - 111 mmol/L   CO2 29 22 - 32 mmol/L   Glucose, Bld 232 (H) 70 - 99 mg/dL    Comment: Glucose reference range applies only to samples taken after fasting for at least 8 hours.   BUN 27 (H) 8 - 23 mg/dL   Creatinine, Ser 1.37 (H) 0.44 - 1.00 mg/dL   Calcium 12.2 (H) 8.9 - 10.3 mg/dL   GFR calc non Af Amer 34 (L) >60 mL/min   GFR calc Af Amer 40 (L) >60 mL/min   Anion gap 8 5 - 15    Comment: Performed at [facility] [address], [place], [place] [address]  Glucose, capillary     Status: Abnormal   Collection Time: 12/14/19  6:37 AM  Result Value Ref Range   Glucose-Capillary 225 (H) 70 - 99 mg/dL    Comment: Glucose reference range applies only to samples taken after fasting for at least 8 hours.   Comment 1 Notify RN    Comment 2 Document in Chart    DG Abd Portable 1V  Result Date: 12/13/2019 CLINICAL DATA:  Nausea, vomiting. EXAM: PORTABLE ABDOMEN - 1 VIEW COMPARISON:  None. FINDINGS: The bowel gas pattern is normal. Probable multiple calcified uterine fibroids are noted. IMPRESSION: No evidence of bowel obstruction or ileus. Electronically Signed   By: Marijo Conception M.D.   On: 12/13/2019 14:55       Medical Problem List and Plan: 1.  Left side hemiplegia, dysarthria, aphasia secondary to left brain infarction.  MRI not completed due to pacemaker  -patient may shower  -ELOS/Goals: 25-30 days/min/mod a  Admit to CIR 2.  Antithrombotics: -DVT/anticoagulation: Subcutaneous Lovenox  -antiplatelet therapy: Aspirin 81 mg daily, Plavix 75 mg daily 3. Pain Management: Tylenol as needed 4. Mood with memory deficits.  Aricept 10 mg nightly  -antipsychotic agents: N/A 5. Neuropsych: This patient is not capable of making decisions on her own behalf. 6. Skin/Wound Care: Routine skin checks 7. Fluids/Electrolytes/Nutrition: Routine in and outs.  CMP ordered for  tomorrow a.m. 8.  Hypertension.  Norvasc 2.5 mg daily, Coreg 3.125 mg twice daily.    Monitor with increased mobility 9.  Diabetes mellitus with hyperglycemia.  Hemoglobin A1c 8.2.  Lantus insulin 10 units twice daily, NovoLog 2 units 3 times daily check blood sugars before meals and at bedtime  Monitor with increased mobility 10.  CAD with stenting as well as pacemaker.  Continue aspirin and Plavix. 11.  CKD stage III.  Creatinine baseline 1.56.  BMP ordered for tomorrow a.m. 12.  Chronic diastolic congestive heart failure.  Monitor for any signs of fluid overload  Daily weights 13.  OSA.  CPAP. 14.  Hyperlipidemia.  Lipitor 15.  Chronic normocytic anemia.  Continue iron supplement  CBC ordered for tomorrow a.m. 16.  Post stroke dysphagia: Dysphagia #2 thin liquids.  Follow-up speech therapy.  Advance as tolerated 17.  UTI/Citrobacter.  Presently on Merrem 1 g every 12 hours 12/13/2019  Lavon Paganini Angiulli, PA-C 12/14/2019  I have personally performed a face to face diagnostic evaluation, including, but not limited to relevant history and physical exam findings, of this patient and developed relevant assessment and plan.  Additionally, I have reviewed and concur with the physician assistant's documentation above.  Delice Lesch, MD, ABPMR  The patient's status has not changed. The original post admission physician evaluation remains appropriate, and any changes from the pre-admission screening or documentation from the acute chart are noted above.   Delice Lesch, MD, ABPMR

## 2019-12-14 NOTE — Progress Notes (Addendum)
Inpatient Rehabilitation Admissions Coordinator  I have insurance approval and bed available at CIR to admit patient today. I met with patient at bedside and she is in agreement. I have left a voicemail for her daughter, Maxwell Marion, to confirm preference before admitting pt to CIR today. I contacted Dr. Aileen Fass and SW, Kathlee Nations. I will make the arrangements to admit later today after speaking with her daughter, [name].  Danne Baxter, RN, MSN Rehab Admissions Coordinator [phone] 12/14/2019 10:46 AM    I have spoken with daughter, [name] and will make the arrangements to admit patient today.  Danne Baxter, RN, MSN Rehab Admissions Coordinator [phone] 12/14/2019 12:00 PM

## 2019-12-14 NOTE — Progress Notes (Signed)
Jamse Arn, MD  Physician  Physical Medicine and Rehabilitation  PMR Pre-admission  Addendum  Date of Service:  12/14/2019 10:56 AM      Related encounter: ED to Hosp-Admission (Discharged) from 12/06/2019 in [place]        Show:Clear all [x] Manual[x] Template[x] Copied  Added by: [x] Katilynn Sinkler, Vertis Kelch, RN[x] Jamse Arn, MD  [] Hover for details PMR Admission Coordinator Pre-Admission Assessment   Patient: [name] is an 86 y.o., female MRN: [id number] DOB: [birth year] Height: 5' 7"  (170.2 cm)(approximately 5'7") Weight: 78.8 kg                                                                                                                                                  Insurance Information HMO:    PPO: yes     PCP:      IPA:      80/20:      OTHER:  PRIMARY: [place]      Policy#: [id number]      Subscriber: pt CM Name: Wells Guiles      Phone#: general number is [phone] option 3     Fax#: [phone] Pre-Cert#: [id number] Candace Cruise # [id number] approved for 7 days      Employer:  Benefits:  Phone #: [phone]     Name: [place]. Date: 10/21/2019     Deduct: none      Out of Pocket Max: $3900      Life Max: none CIR: $345 co pay per day days 1 until 5      SNF: no copay days 1 until 20; $184 co pay per day days 21 until 42; no co pay days 43 until 100 Outpatient: $35 per visit     Co-Pay: visits per medical necessity Home Health: 100%      Co-Pay: visits per medical neccesity DME: 80%     Co-Pay: 20% Providers: in network  SECONDARY: none        Medicaid Application Date:       Case Manager:  Disability Application Date:       Case Worker:    The "Data Collection Information Summary" for patients in Inpatient Rehabilitation Facilities with attached "Privacy Act [place] Records" was provided and verbally reviewed with: Family   Emergency Contact Information Contact Information       Name  Relation Home Work Eunice Daughter [phone]   [phone]    Jones,Cynthia Daughter [phone]             Current Medical History  Patient Admitting Diagnosis: CVA   History of Present Illness: 86 year old right-handed female with history of CAD with stenting, CKD stage III with creatinine 1.56, CVA maintained on Plavix, memory loss maintained on Aricept followed by Dr. Jannifer Franklin, heart block with pacemaker, hypertension, type 2 diabetes  mellitus, OSA with CPAP, chronic normocytic anemia, chronic diastolic congestive heart failure.  Presented 12/07/2019 after a fall without loss of consciousness.  She was noted to have slurred speech and left hemiparesis.  Cranial CT scan unremarkable for acute intracranial process.  Patient did not receive TPA.  MRI not completed due to pacemaker.  Echocardiogram with ejection fraction of 55% without emboli.  Neurology follow-up currently maintained on aspirin and Plavix for CVA prophylaxis subcutaneous Lovenox for DVT prophylaxis.  Admission chemistries with creatinine 1.62, SARS coronavirus negative.  Patient is currently maintained on a dysphagia #2 thin liquid diet.  Urinalysis study 12/12/2019 greater than 100,000 Citrobacter placed on Merrem 12/13/2019.       Complete NIHSS TOTAL: 13 Glasgow Coma Scale Score: 14   Past Medical History      Past Medical History:  Diagnosis Date  . Anemia    . Arthritis    . Celiac disease    . Chronic systolic CHF (congestive heart failure) ([place])    . Complete heart block ([place])    . Coronary artery disease    . Diabetes mellitus      INSULIN DEPENDENT  . GERD (gastroesophageal reflux disease)    . Glaucoma    . Headache(784.0)    . Heart disease    . Hypercholesterolemia    . Hypertension    . Iron deficiency anemia, unspecified    . Ischemic cardiomyopathy    . Memory difficulties 04/14/2019  . Shortness of breath    . Sleep apnea      uses cpap  . Stroke ([place])    . Unspecified  constipation        Family History  family history includes Cancer in her sister; Dementia in her sister; Diabetes in her mother; Hyperlipidemia in her mother; Hypertension in her mother; Neuropathy in her sister.   Prior Rehab/Hospitalizations:  Has the patient had prior rehab or hospitalizations prior to admission? Yes   Has the patient had major surgery during 100 days prior to admission? No   Current Medications    Current Facility-Administered Medications:  .  acetaminophen (TYLENOL) tablet 650 mg, 650 mg, Oral, Q4H PRN, 650 mg at 12/07/19 1618 **OR** acetaminophen (TYLENOL) 160 MG/5ML solution 650 mg, 650 mg, Per Tube, Q4H PRN **OR** acetaminophen (TYLENOL) suppository 650 mg, 650 mg, Rectal, Q4H PRN, Tu, Ching T, DO .  amLODipine (NORVASC) tablet 2.5 mg, 2.5 mg, Oral, Daily, Alma Friendly, MD, 2.5 mg at 12/14/19 0955 .  aspirin EC tablet 81 mg, 81 mg, Oral, Daily, Alma Friendly, MD, 81 mg at 12/14/19 0955 .  atorvastatin (LIPITOR) tablet 80 mg, 80 mg, Oral, q morning - 10a, Tu, Ching T, DO, 80 mg at 12/14/19 0955 .  carvedilol (COREG) tablet 3.125 mg, 3.125 mg, Oral, BID, Alma Friendly, MD, 3.125 mg at 12/14/19 0955 .  clopidogrel (PLAVIX) tablet 75 mg, 75 mg, Oral, Daily, Tu, Ching T, DO, 75 mg at 12/14/19 0955 .  donepezil (ARICEPT) tablet 10 mg, 10 mg, Oral, QHS, Tu, Ching T, DO, 10 mg at 12/13/19 2113 .  enoxaparin (LOVENOX) injection 30 mg, 30 mg, Subcutaneous, Daily, Tu, Ching T, DO, 30 mg at 12/14/19 1000 .  ferrous sulfate tablet 325 mg, 325 mg, Oral, Q breakfast, Tu, Ching T, DO, 325 mg at 12/14/19 0820 .  insulin aspart (novoLOG) injection 0-5 Units, 0-5 Units, Subcutaneous, QHS, Charlynne Cousins, MD .  insulin aspart (novoLOG) injection 0-9 Units, 0-9 Units, Subcutaneous, TID WC, Feliz  Marguarite Arbour, MD .  insulin aspart (novoLOG) injection 2 Units, 2 Units, Subcutaneous, TID WC, Charlynne Cousins, MD .  insulin glargine (LANTUS) injection 10  Units, 10 Units, Subcutaneous, BID, Charlynne Cousins, MD, 10 Units at 12/14/19 1050 .  ipratropium-albuterol (DUONEB) 0.5-2.5 (3) MG/3ML nebulizer solution 3 mL, 3 mL, Nebulization, Q6H PRN, Alma Friendly, MD .  meropenem (MERREM) 1 g in sodium chloride 0.9 % 100 mL IVPB, 1 g, Intravenous, Q12H, Joselyn Glassman A, RPH, Stopped at 12/14/19 1036 .  ondansetron (ZOFRAN) injection 4 mg, 4 mg, Intravenous, Q8H PRN, Alma Friendly, MD, 4 mg at 12/13/19 1244 .  pantoprazole (PROTONIX) EC tablet 40 mg, 40 mg, Oral, QAC breakfast, Tu, Ching T, DO, 40 mg at 12/14/19 0820 .  polyethylene glycol (MIRALAX / GLYCOLAX) packet 17 g, 17 g, Oral, BID, Alma Friendly, MD, 17 g at 12/14/19 1000 .  senna-docusate (Senokot-S) tablet 1 tablet, 1 tablet, Oral, BID, Alma Friendly, MD, 1 tablet at 12/14/19 3235   Patients Current Diet:  Diet Order                  DIET DYS 2 Room service appropriate? No; Fluid consistency: Thin  Diet effective now                      Precautions / Restrictions Precautions Precautions: Fall Precaution Comments: R knee buckling and scissoring. Restrictions Weight Bearing Restrictions: No    Has the patient had 2 or more falls or a fall with injury in the past year?Yes   Prior Activity Level Limited Community (1-2x/wk): sueprvision to min assist with SPC   Prior Functional Level Prior Function Level of Independence: Needs assistance Gait / Transfers Assistance Needed: pt reports use of SPC for mobility ADL's / Homemaking Assistance Needed: pt's daughter assists with bathing Comments: aide M-F 8 hours a day (? pt difficulty reporting time) helps with BADL   Self Care: Did the patient need help bathing, dressing, using the toilet or eating?  Needed some help   Indoor Mobility: Did the patient need assistance with walking from room to room (with or without device)? Needed some help   Stairs: Did the patient need assistance with internal or  external stairs (with or without device)? Needed some help   Functional Cognition: Did the patient need help planning regular tasks such as shopping or remembering to take medications? Needed some help   Home Assistive Devices / Equipment Home Assistive Devices/Equipment: Kasandra Knudsen (specify quad or straight)(Straight cane) Home Equipment: Cane - single point, Bedside commode, Shower seat   Prior Device Use: Indicate devices/aids used by the patient prior to current illness, exacerbation or injury?  spc   Current Functional Level Cognition   Arousal/Alertness: Awake/alert Overall Cognitive Status: History of cognitive impairments - at baseline Orientation Level: Oriented to person, Oriented to place, Disoriented to time, Oriented to situation Safety/Judgement: Decreased awareness of safety, Decreased awareness of deficits General Comments: pt requiring cues and support for multistep tasks for safety Attention: Sustained Sustained Attention: Appears intact Memory: Impaired Memory Impairment: Prospective memory, Decreased short term memory Decreased Short Term Memory: Verbal basic    Extremity Assessment (includes Sensation/Coordination)   Upper Extremity Assessment: Defer to OT evaluation  Lower Extremity Assessment: RLE deficits/detail, LLE deficits/detail RLE Deficits / Details: Strength 5/5 LLE Deficits / Details: Strength 5/5     ADLs   Overall ADL's : Needs assistance/impaired Eating/Feeding: Maximal assistance, With adaptive utensils Eating/Feeding Details (  indicate cue type and reason): Set-up for eating, able to find adaptive fork (built up) however unable to load or bring to mouth Grooming: Set up, Sitting Grooming Details (indicate cue type and reason): attempted to stand, pt could not sustain with R knee buckle and needing to sit Upper Body Bathing: Minimal assistance, Sitting Lower Body Bathing: Total assistance, +2 for safety/equipment Lower Body Bathing Details (indicate  cue type and reason): Completed in sitting, pt unable to bathe feet due to decreased balance Upper Body Dressing : Minimal assistance, Sitting Lower Body Dressing: Sitting/lateral leans, Total assistance Lower Body Dressing Details (indicate cue type and reason): put unable to maintain balance to reach down to doff socks in sitting Toilet Transfer: +2 for physical assistance, Maximal assistance, Total assistance, Cueing for safety, Cueing for sequencing, BSC Toilet Transfer Details (indicate cue type and reason): Used stedy to transfer 2x to BSC, max to total A of 2 as session progressed due to fatigue Toileting- Clothing Manipulation and Hygiene: Cueing for safety, Cueing for sequencing, +2 for physical assistance, Total assistance Toileting - Clothing Manipulation Details (indicate cue type and reason): Total A for peri care in session, pt unable to maintain balance in stting Functional mobility during ADLs: Maximal assistance, Total assistance, +2 for physical assistance, +2 for safety/equipment, Cueing for sequencing, Cueing for safety General ADL Comments: Pt now requring stedy, plus 2 assist in order to complete ADLs, pt was noted to get progressively weaker as session continued     Mobility   Overal bed mobility: Needs Assistance Bed Mobility: Rolling, Sidelying to Sit Rolling: Mod assist, +2 for physical assistance Sidelying to sit: Mod assist, +2 for physical assistance Supine to sit: Mod assist General bed mobility comments: Pt required assistance to flex R knee into hooklying and push to roll into sidelying.  PTA provided Hand over Hand assistance with RUE.  HOB elevated to push into sitting.  Pt is slow and guarded with poor sitting balance.  She required total assistance to scoot hips forward.     Transfers   Overall transfer level: Needs assistance Equipment used: Ambulation equipment used, None(sara stedy) Transfer via Lift Equipment: Stedy Transfers: Sit to/from Stand, Journalist, newspaper Sit to Stand: Max assist, Total assist, +2 physical assistance Stand pivot transfers: Total assist, +2 physical assistance General transfer comment: Pt seated edge of bed on arrival and needed to toilet  She presents with decreased strength and poor balance and required total assistance to move from bed  to bed side commode.  Pt then utilized sara stedy for remainder of transfers this session.  She required increased assistance on her R side to protect her RUE and maintain balance.  Strong R lateral lean noted  in sitting on sara stedy and in standing she presented with R forward lean with excessive hip flexion.     Ambulation / Gait / Stairs / Wheelchair Mobility   Ambulation/Gait Ambulation/Gait assistance: (pt remains to decline functionally and gt remains unsafe at this time.) Gait Distance (Feet): 4 Feet(attempted steps from bed to recliner and she was increasingly unsteady with noted scissoring of RLE past midline.  She was able to take steps to recliner but then required recliner to be brought to her bottom as she became increasingly unsteady.) Assistive device: Rolling walker (2 wheeled) Gait Pattern/deviations: Step-through pattern, Decreased stride length, Trunk flexed, Scissoring General Gait Details: Pt with increased trunk flexion,  and strong R lateral lean.  She was unable to complete steps to recliner before requiring  chair to be pulled up to her bottom. Gait velocity: decreased     Posture / Balance Dynamic Sitting Balance Sitting balance - Comments: cannot tolerate challenge Balance Overall balance assessment: Needs assistance Sitting-balance support: Feet supported Sitting balance-Leahy Scale: Poor Sitting balance - Comments: cannot tolerate challenge Standing balance support: Bilateral upper extremity supported, During functional activity Standing balance-Leahy Scale: Zero Standing balance comment: Pt with poor balance in inital stands, as trials continued  zero balance noted     Special needs/care consideration BiPAP/CPAP yes pta CPM Continuous Drip IV Dialysis         Life Vest Oxygen Special Bed Trach Size Wound Vac  Skin right heel stage 1; coccyx stage 1 Bowel mgmt: incontinent Bladder mgmt: incontinent has external catheter Diabetic mgmt Hgb A1c 8.2 Behavioral consideration  Chemo/radiation  Designated visitor is daughter, [name]                                                              DNR [place] Arrangements: Other (Comment)  Lives With: (daughter lives next door, son and daughter to asisst) Available Help at Discharge: Family, Available 24 hours/day(son, daughter and aide to assist) Type of Home: House Home Layout: One level Home Access: Level entry Bathroom Shower/Tub: Chiropodist: Standard Bathroom Accessibility: Yes How Accessible: Accessible via walker Home Care Services: No   Discharge Living Setting Plans for Discharge Living Setting: Patient's home, Alone(with family coming in from next door, [place]) Type of Home at Discharge: House Discharge Home Layout: One level Discharge Home Access: Level entry Discharge Bathroom Shower/Tub: Tub/shower unit Discharge Bathroom Toilet: Standard Discharge Bathroom Accessibility: Yes How Accessible: Accessible via walker Does the patient have any problems obtaining your medications?: No   Social/Family/Support Systems Patient Roles: Parent Contact Information: [name], daughter is main contact Anticipated Caregiver: daughter, son and aide Anticipated Caregiver's Contact Information: see above Ability/Limitations of Caregiver: no limitations; [name] lives next door Caregiver Availability: 24/7 Discharge Plan Discussed with Primary Caregiver: Yes Is Caregiver In Agreement with Plan?: Yes Does Caregiver/Family have Issues with Lodging/Transportation while Pt is in Rehab?: No   Daughter states she  will have an aide to assist with her care. When asked what company and I explained that her insurance would not cover aide services for 8 hrs per day. Daughter denies patient has Medicaid.   Goals/Additional Needs Patient/Family Goal for Rehab: min asisst with PT, min to mod assist with OT, supervision SLP Expected length of stay: [facility]OS 3 to 3 weeks Pt/Family Agrees to Admission and willing to participate: Yes Program Orientation Provided & Reviewed with Pt/Caregiver Including Roles  & Responsibilities: Yes   Decrease burden of Care through IP rehab admission: No Possible need for SNF placement upon discharge: Not anticipated   Patient Condition: This patient's medical and functional status has changed since the consult dated 12/08/2019 in which the Rehabilitation Physician determined and documented that the patient was potentially appropriate for intensive rehabilitative care in an inpatient rehabilitation facility. Issues have been addressed and update has been discussed with Dr. Posey Pronto and patient now appropriate for inpatient rehabilitation. Will admit to inpatient rehab today.    Preadmission Screen Completed By:  Cleatrice Burke, RN, 12/14/2019 11:16 AM ______________________________________________________________________   Discussed status with Dr.  Patel on 12/14/2019 at 1200 and received approval for admission today.   Admission Coordinator:  Cleatrice Burke, time 1200 Date 12/14/2019     Revision History

## 2019-12-14 NOTE — Discharge Summary (Signed)
Physician Discharge Summary  [name] NWG:[id number] DOB: January 14, 1928 DOA: 12/06/2019  PCP: [name], MD  Admit date: 12/06/2019 Discharge date: 12/14/2019  Admitted From: Home Disposition:  CIR  Recommendations for Outpatient Follow-up:  1. Follow up with PCP in 1-2 weeks follow-up on calcium levels. 2. Please obtain BMP/CBC in one week   Home Health:No Equipment/Devices:none  Discharge Condition:Stable CODE STATUS:DNR Diet recommendation: Heart Healthy  Brief/Interim Summary:  86 y.o. female past medical history significant for CAD, CVA on Plavix heart block with a pacemaker diabetes mellitus type 2 obstructive sleep apnea on CPAP who presents for fall.  She denied any dizziness of loss consciousness.  Her symptoms resolved on arrival to the CT of the head was negative for any acute abnormalities.  MRI of the brain cannot be performed due to pacemaker being incompatible.  Discharge Diagnoses:  Principal Problem:   Weakness Active Problems:   Hypercholesterolemia   Hypertension   Type 2 diabetes mellitus (HCC)   Obstructive sleep apnea   Pacemaker   CKD (chronic kidney disease), stage II   History of CVA (cerebrovascular accident)   OSA (obstructive sleep apnea)   Chronic diastolic congestive heart failure (HCC)   Pressure injury of skin Right-sided hemiparesis likely due to CVA: Neurology was consulted hemoglobin A1c was 8.2 fasting lipid panel was greater than 100 she was started on statin, MRI of the brain could not be performed due to to its comparability. CT scan of the head showed no acute findings. Physical therapy evaluated the patient recommended CIR, speech evaluated the patient recommended a dysphagia 2 diet, transthoracic echo showed an EF of 50% no wall motion abnormalities. She will be continued on aspirin and Plavix as an outpatient.  Chronic kidney disease stage III: No changes made to her medication her calcitriol was held it will be resumed as  an outpatient will need to check a calcium as an outpatient.  Chronic normocytic anemia: Hemoglobin at baseline.  Diabetes mellitus type 2: With an A1c of 8.2 continue current regimen no changes made.  Essential hypertension: Initially her antihypertensive medications were held her Coreg and Norvasc were continued, she will resume ARB and HCTZ as an outpatient as tolerated.  Hyperlipidemia: Continue statins.  Mild cognitive impairment: Continue Aricept.  Probable UTI: Urine culture grow Citrobacter she was started on meropenem pansensitive it was changed to Keflex which she will continue as an outpatient.  Discharge Instructions   Allergies as of 12/14/2019   No Known Allergies     Medication List    TAKE these medications   amLODipine 2.5 MG tablet Commonly known as: NORVASC Take 2.5 mg by mouth every morning.   aspirin 81 MG EC tablet Take 1 tablet (81 mg total) by mouth daily. Start taking on: December 15, 2019   atorvastatin 80 MG tablet Commonly known as: LIPITOR Take 80 mg by mouth every morning.   calcitRIOL 0.25 MCG capsule Commonly known as: ROCALTROL Take 0.25 mcg by mouth every morning.   carvedilol 3.125 MG tablet Commonly known as: COREG Take 3.125 mg by mouth 2 (two) times daily.   cephALEXin 500 MG capsule Commonly known as: KEFLEX Take 1 capsule (500 mg total) by mouth 4 (four) times daily for 3 days.   clopidogrel 75 MG tablet Commonly known as: PLAVIX Take 1 tablet (75 mg total) by mouth daily.   Cod Liver Oil Caps Take 1 capsule by mouth at bedtime.   Combigan 0.2-0.5 % ophthalmic solution Generic drug: brimonidine-timolol Place 1 drop  into both eyes 2 (two) times daily.   donepezil 10 MG tablet Commonly known as: ARICEPT Take 10 mg by mouth at bedtime.   dorzolamide 2 % ophthalmic solution Commonly known as: TRUSOPT Place 1 drop into both eyes 2 (two) times daily.   Eye Vitamins Caps Take 1 capsule by mouth every morning.    ferrous sulfate 325 (65 FE) MG tablet Take 325 mg by mouth daily with breakfast.   HumaLOG KwikPen 200 UNIT/ML Sopn Generic drug: Insulin Lispro Inject 6 Units into the skin 3 (three) times daily as needed (high blood sugar).   latanoprost 0.005 % ophthalmic solution Commonly known as: XALATAN Appointment OVERDUE, place 1 drop into both eyes daily at bedtime What changed:   how much to take  how to take this  when to take this  additional instructions   linagliptin 5 MG Tabs tablet Commonly known as: TRADJENTA Take 5 mg by mouth every morning.   loperamide 2 MG capsule Commonly known as: IMODIUM Take 2 mg by mouth as needed for diarrhea or loose stools.   multivitamin with minerals Tabs tablet Take 1 tablet by mouth at bedtime.   nitroGLYCERIN 0.4 MG SL tablet Commonly known as: NITROSTAT Place 0.4 mg under the tongue every 5 (five) minutes as needed for chest pain.   NovoFine 32G X 6 MM Misc Generic drug: Insulin Pen Needle   pantoprazole 40 MG tablet Commonly known as: PROTONIX Take 40 mg by mouth daily before breakfast.   Tyler Aas FlexTouch 100 UNIT/ML Sopn FlexTouch Pen Generic drug: insulin degludec Inject 10-20 Units into the skin See admin instructions. Inject 10-20 units into the skin at bedtime, based upon CGB (home capillary blood glucose)   valsartan-hydrochlorothiazide 320-12.5 MG tablet Commonly known as: DIOVAN-HCT Take 1 tablet by mouth every morning.       No Known Allergies  Consultations:  Neurology   Procedures/Studies: DG Chest 2 View  Result Date: 12/07/2019 CLINICAL DATA:  Frequent falls. EXAM: CHEST - 2 VIEW COMPARISON:  08/07/2015 FINDINGS: Moderate thoracic spondylosis. Pacer with leads at right atrium and right ventricle. Patient rotated right on the frontal. Normal heart size. No pleural effusion or pneumothorax. Clear lungs. IMPRESSION: No acute cardiopulmonary disease. Electronically Signed   By: Abigail Miyamoto M.D.   On:  12/07/2019 09:40   DG Knee 1-2 Views Left  Result Date: 12/07/2019 CLINICAL DATA:  Knee pain, bilateral EXAM: LEFT KNEE - 1-2 VIEW COMPARISON:  10/16/2012 FINDINGS: Tricompartmental osteoarthritic change with complete loss of the joint space in the medial compartment. No signs of acute fracture. Small joint effusion. IMPRESSION: Tricompartmental osteoarthritic change greatest in medial compartment with small joint effusion. Electronically Signed   By: Zetta Bills M.D.   On: 12/07/2019 20:10   DG Knee 1-2 Views Right  Result Date: 12/07/2019 CLINICAL DATA:  Bilateral knee plain select plain pain EXAM: RIGHT KNEE - 1-2 VIEW COMPARISON:  October 16, 2012 FINDINGS: Marked tricompartmental osteoarthritic change greatest in medial compartment with complete loss of the joint space and marginal osteophytes, worse than in 2013. Osteopenia. No acute fracture. Postoperative changes about the medial left thigh and posterior lower extremity may reflect changes of previous graft harvest. IMPRESSION: 1. Marked tricompartmental osteoarthritic change greatest in the medial compartment of the right knee, worse than in 2013. 2. Osteopenia. Electronically Signed   By: Zetta Bills M.D.   On: 12/07/2019 20:08   CT HEAD WO CONTRAST  Result Date: 12/08/2019 CLINICAL DATA:  Neuro deficit, stroke suspected EXAM: CT  HEAD WITHOUT CONTRAST TECHNIQUE: Contiguous axial images were obtained from the base of the skull through the vertex without intravenous contrast. COMPARISON:  12/07/2019 FINDINGS: Brain: There is atrophy and chronic small vessel disease changes. No acute intracranial abnormality. Specifically, no hemorrhage, hydrocephalus, mass lesion, acute infarction, or significant intracranial injury. Vascular: No hyperdense vessel or unexpected calcification. Skull: No acute calvarial abnormality. Sinuses/Orbits: Bilateral ocular implants again noted, stable. No acute findings. Other: None IMPRESSION: Atrophy, chronic  microvascular disease. No acute intracranial abnormality. Electronically Signed   By: Rolm Baptise M.D.   On: 12/08/2019 10:19   CT HEAD WO CONTRAST  Result Date: 12/07/2019 CLINICAL DATA:  Stroke follow-up EXAM: CT HEAD WITHOUT CONTRAST TECHNIQUE: Contiguous axial images were obtained from the base of the skull through the vertex without intravenous contrast. COMPARISON:  Head CT December 06, 2019 FINDINGS: Brain: No evidence of acute infarction, hemorrhage, hydrocephalus, extra-axial collection or mass lesion/mass effect. Confluent hypodensity in the periventricular white matter, nonspecific, most likely related to chronic small vessel ischemia, unchanged. Prominence of the supratentorial ventricles and cerebral sulci reflecting parenchymal volume loss. Vascular: No hyperdense vessel or unexpected calcification. Skull: Normal. Negative for fracture or focal lesion. Sinuses/Orbits: Bilateral ocular implants (GDD). Other: None. IMPRESSION: No acute intracranial abnormality. No significant change from prior. Electronically Signed   By: Pedro Earls M.D.   On: 12/07/2019 15:00   CT HEAD WO CONTRAST  Result Date: 12/06/2019 CLINICAL DATA:  Possible stroke EXAM: CT HEAD WITHOUT CONTRAST TECHNIQUE: Contiguous axial images were obtained from the base of the skull through the vertex without intravenous contrast. COMPARISON:  CT head 11/07/2019 FINDINGS: Brain: Moderate atrophy. Mild chronic microvascular ischemic changes in the white matter. Negative for acute infarct, hemorrhage, mass.  No midline shift. Vascular: Negative for hyperdense vessel. Atherosclerotic calcification. Skull: Negative Sinuses/Orbits: Paranasal sinuses show mild mucosal edema. Bilateral ocular surgery Other: None IMPRESSION: Atrophy and chronic microvascular ischemia. No acute intracranial abnormality. Electronically Signed   By: Franchot Gallo M.D.   On: 12/06/2019 19:10   DG Chest Port 1 View  Result Date:  12/11/2019 CLINICAL DATA:  86 year old female with history of trauma from a fall. EXAM: PORTABLE CHEST 1 VIEW COMPARISON:  Chest x-ray 12/07/2019. FINDINGS: Lung volumes are low. No consolidative airspace disease. No pleural effusions. No pneumothorax. No pulmonary nodule or mass noted. Pulmonary vasculature and the cardiomediastinal silhouette are within normal limits. Aortic atherosclerosis. Status post median sternotomy for CABG. Left-sided pacemaker device in place with lead tips projecting over the expected location of the right atrium and right ventricle. IMPRESSION: 1. Low lung volumes without radiographic evidence of acute cardiopulmonary disease. 2. Aortic atherosclerosis. 3. Postoperative changes and support apparatus, as above. Electronically Signed   By: Vinnie Langton M.D.   On: 12/11/2019 17:22   DG Abd Portable 1V  Result Date: 12/13/2019 CLINICAL DATA:  Nausea, vomiting. EXAM: PORTABLE ABDOMEN - 1 VIEW COMPARISON:  None. FINDINGS: The bowel gas pattern is normal. Probable multiple calcified uterine fibroids are noted. IMPRESSION: No evidence of bowel obstruction or ileus. Electronically Signed   By: Marijo Conception M.D.   On: 12/13/2019 14:55   ECHOCARDIOGRAM COMPLETE  Result Date: 12/07/2019    ECHOCARDIOGRAM REPORT   Patient Name:   [name] Date of Exam: 12/07/2019 Medical Rec #:  [id number]         Height:       64.0 in Accession #:    [id number]        Weight:  173.7 lb Date of Birth:  July 08, 1928         BSA:          1.84 m Patient Age:    33 years          BP:           128/56 mmHg Patient Gender: F                 HR:           77 bpm. Exam Location:  Inpatient Procedure: 2D Echo Indications:    weakness  History:        Patient has prior history of Echocardiogram examinations, most                 recent 07/02/2019. Cardiomyopathy and CHF, CAD, Pacemaker,                 Stroke; Risk Factors:Hypertension, Dyslipidemia, Diabetes and                 Former Smoker. Complete  heart block.  Sonographer:    Jannett Celestine RDCS (AE) Referring Phys: [id number] [place] T TU  Sonographer Comments: Suboptimal apical window and suboptimal subcostal window. limited mobility. patient unable to turn entirely on left side (partially). apical windows obtained supine. IMPRESSIONS  1. Left ventricular ejection fraction, by estimation, is 50 to 55%. The left ventricle has low normal function. The left ventricle has no regional wall motion abnormalities. There is mild concentric left ventricular hypertrophy. Left ventricular diastolic parameters are indeterminate.  2. Right ventricular systolic function is mildly reduced. The right ventricular size is normal. There is severely elevated pulmonary artery systolic pressure.  3. The mitral valve is degenerative. Mild mitral valve regurgitation.  4. The aortic valve is tricuspid. Aortic valve regurgitation is not visualized. No aortic stenosis is present. FINDINGS  Left Ventricle: Left ventricular ejection fraction, by estimation, is 50 to 55%. The left ventricle has low normal function. The left ventricle has no regional wall motion abnormalities. The left ventricular internal cavity size was normal in size. There is mild concentric left ventricular hypertrophy. Left ventricular diastolic parameters are indeterminate. Right Ventricle: The right ventricular size is normal. No increase in right ventricular wall thickness. Right ventricular systolic function is mildly reduced. There is severely elevated pulmonary artery systolic pressure. The tricuspid regurgitant velocity is 2.23 m/s, and with an assumed right atrial pressure of 87 mmHg, the estimated right ventricular systolic pressure is 371.6 mmHg. Left Atrium: Left atrial size was not well visualized. Right Atrium: Right atrial size was not well visualized. Pericardium: There is no evidence of pericardial effusion. Mitral Valve: The mitral valve is degenerative in appearance. There is mild thickening of the  mitral valve leaflet(s). There is mild calcification of the mitral valve leaflet(s). Mild mitral valve regurgitation. Tricuspid Valve: The tricuspid valve is normal in structure. Tricuspid valve regurgitation is mild . No evidence of tricuspid stenosis. Aortic Valve: The aortic valve is tricuspid. . There is severe thickening and severe calcifcation of the aortic valve. Aortic valve regurgitation is not visualized. No aortic stenosis is present. There is severe thickening of the aortic valve. There is severe calcifcation of the aortic valve. Pulmonic Valve: The pulmonic valve was grossly normal. Pulmonic valve regurgitation is not visualized. No evidence of pulmonic stenosis. Aorta: The aortic root is normal in size and structure. Venous: The inferior vena cava was not well visualized. IAS/Shunts: The atrial septum is grossly normal. Additional Comments: A pacer  wire is visualized.  LEFT VENTRICLE PLAX 2D LVIDd:         3.10 cm  Diastology LVIDs:         2.53 cm  LV e' lateral:   4.00 cm/s LV PW:         1.20 cm  LV E/e' lateral: 19.4 LV IVS:        1.40 cm  LV e' medial:    4.00 cm/s LVOT diam:     2.00 cm  LV E/e' medial:  19.4 LV SV:         37.70 ml LV SV Index:   7.77 LVOT Area:     3.14 cm  RIGHT VENTRICLE RV S prime:     10.00 cm/s TAPSE (M-mode): 1.6 cm LEFT ATRIUM             Index       RIGHT ATRIUM           Index LA diam:        2.80 cm 1.52 cm/m  RA Area:     11.60 cm LA Vol (A2C):   35.3 ml 19.16 ml/m RA Volume:   21.00 ml  11.40 ml/m LA Vol (A4C):   32.6 ml 17.69 ml/m LA Biplane Vol: 36.2 ml 19.65 ml/m  AORTIC VALVE LVOT Vmax:   60.60 cm/s LVOT Vmean:  39.800 cm/s LVOT VTI:    0.120 m MITRAL VALVE               TRICUSPID VALVE MV Area (PHT): 2.29 cm    TR Peak grad:   19.9 mmHg MV Decel Time: 331 msec    TR Vmax:        223.00 cm/s MV E velocity: 77.60 cm/s                            SHUNTS                            Systemic VTI:  0.12 m                            Systemic Diam: 2.00 cm  Buford Dresser MD Electronically signed by Buford Dresser MD Signature Date/Time: 12/07/2019/9:19:01 PM    Final    DG Hip Unilat W or Wo Pelvis 2-3 Views Right  Result Date: 12/06/2019 CLINICAL DATA:  86 year old female status post fall with right hip pain. EXAM: DG HIP (WITH OR WITHOUT PELVIS) 2-3V RIGHT COMPARISON:  Right hip series 12/15/2003. FINDINGS: Chronic fibroid uterus with dystrophic calcifications. Femoral heads are normally located. Hip joint spaces appear symmetric. The proximal right femur appears intact. The proximal left femur appear symmetric and grossly intact. No pelvic fracture is identified. Mild spurring at the pubic symphysis is chronic. Negative visible bowel gas pattern. Calcified femoral artery atherosclerosis. IMPRESSION: No acute fracture or dislocation identified about the right hip or pelvis. Electronically Signed   By: Genevie Ann M.D.   On: 12/06/2019 20:55     Subjective:  No complains Discharge Exam: Vitals:   12/14/19 0803 12/14/19 0955  BP: (!) 134/53 132/64  Pulse: 78 72  Resp: 18   Temp: 98.7 F (37.1 C)   SpO2: 93%    Vitals:   12/13/19 2210 12/14/19 0409 12/14/19 0803 12/14/19 0955  BP:  (!) 134/59 (!) 134/53 132/64  Pulse: 79  69 78 72  Resp: 18 18 18    Temp:  98.9 F (37.2 C) 98.7 F (37.1 C)   TempSrc:  Oral Oral   SpO2: 100% 96% 93%   Weight:      Height:        General: Pt is alert, awake, not in acute distress Cardiovascular: RRR, S1/S2 +, no rubs, no gallops Respiratory: CTA bilaterally, no wheezing, no rhonchi Abdominal: Soft, NT, ND, bowel sounds + Extremities: no edema, no cyanosis    The results of significant diagnostics from this hospitalization (including imaging, microbiology, ancillary and laboratory) are listed below for reference.     Microbiology: Recent Results (from the past 240 hour(s))  SARS CORONAVIRUS 2 (TAT 6-24 HRS) Nasopharyngeal Nasopharyngeal Swab     Status: None   Collection Time:  12/06/19  9:56 PM   Specimen: Nasopharyngeal Swab  Result Value Ref Range Status   SARS Coronavirus 2 NEGATIVE NEGATIVE Final    Comment: (NOTE) SARS-CoV-2 target nucleic acids are NOT DETECTED. The SARS-CoV-2 RNA is generally detectable in upper and lower respiratory specimens during the acute phase of infection. Negative results do not preclude SARS-CoV-2 infection, do not rule out co-infections with other pathogens, and should not be used as the sole basis for treatment or other patient management decisions. Negative results must be combined with clinical observations, patient history, and epidemiological information. The expected result is Negative. Fact Sheet for Patients: SugarRoll.be Fact Sheet for Healthcare Providers: https://www.woods-mathews.com/ This test is not yet approved or cleared by the [place] FDA and  has been authorized for detection and/or diagnosis of SARS-CoV-2 by FDA under an Emergency Use Authorization (EUA). This EUA will remain  in effect (meaning this test can be used) for the duration of the COVID-19 declaration under Section 56 4(b)(1) of the Act, 21 U.S.C. section 360bbb-3(b)(1), unless the authorization is terminated or revoked sooner. Performed at [facility], [place] [address] [place], [place] [address]   Culture, Urine     Status: Abnormal   Collection Time: 12/07/19  9:23 PM   Specimen: Urine, Random  Result Value Ref Range Status   Specimen Description URINE, RANDOM  Final   Special Requests   Final    NONE Performed at [facility], [place] [address] [place], [place] [address]    Culture MULTIPLE SPECIES PRESENT, SUGGEST RECOLLECTION (A)  Final   Report Status 12/08/2019 FINAL  Final  Culture, Urine     Status: Abnormal (Preliminary result)   Collection Time: 12/12/19  7:13 AM   Specimen: Urine, Random  Result Value Ref Range Status   Specimen Description URINE, RANDOM  Final    Special Requests NONE  Final   Culture (A)  Final    >=100,000 COLONIES/mL CITROBACTER KOSERI CULTURE REINCUBATED FOR BETTER GROWTH Performed at [facility], [place] [address], [place], [place] [address]    Report Status PENDING  Incomplete   Organism ID, Bacteria CITROBACTER KOSERI (A)  Final      Susceptibility   Citrobacter koseri - MIC*    CEFAZOLIN <=4 SENSITIVE Sensitive     CEFTRIAXONE <=0.25 SENSITIVE Sensitive     CIPROFLOXACIN <=0.25 SENSITIVE Sensitive     GENTAMICIN <=1 SENSITIVE Sensitive     IMIPENEM <=0.25 SENSITIVE Sensitive     NITROFURANTOIN 32 SENSITIVE Sensitive     TRIMETH/SULFA <=20 SENSITIVE Sensitive     PIP/TAZO <=4 SENSITIVE Sensitive     * >=100,000 COLONIES/mL CITROBACTER KOSERI  Labs: BNP (last 3 results) No results for input(s): BNP in the last 8760 hours. Basic Metabolic Panel: Recent Labs  Lab 12/10/19 0341 12/11/19 0346 12/12/19 0309 12/13/19 0338 12/14/19 0552  NA 139 140 137 137 142  K 4.5 4.2 4.5 4.5 4.5  CL 102 103 100 102 105  CO2 28 28 25 27 29   GLUCOSE 182* 183* 246* 230* 232*  BUN 20 20 24* 21 27*  CREATININE 1.29* 1.22* 1.42* 1.21* 1.37*  CALCIUM 11.5* 11.1* 11.8* 11.4* 12.2*   Liver Function Tests: No results for input(s): AST, ALT, ALKPHOS, BILITOT, PROT, ALBUMIN in the last 168 hours. No results for input(s): LIPASE, AMYLASE in the last 168 hours. No results for input(s): AMMONIA in the last 168 hours. CBC: Recent Labs  Lab 12/10/19 0341 12/11/19 0346 12/12/19 0309 12/13/19 0338 12/14/19 0552  WBC 8.1 7.2 12.3* 12.6* 10.7*  NEUTROABS 3.7 3.6 9.8* 9.1* 7.5  HGB 10.7* 10.4* 10.8* 10.1* 10.1*  HCT 34.0* 32.9* 34.4* 32.2* 32.3*  MCV 92.1 91.9 92.7 91.7 92.3  PLT 217 227 245 214 212   Cardiac Enzymes: No results for input(s): CKTOTAL, CKMB, CKMBINDEX, TROPONINI in the last 168 hours. BNP: Invalid input(s): POCBNP CBG: Recent Labs  Lab 12/13/19 0610 12/13/19 1252 12/13/19 1605 12/13/19 2128  12/14/19 0637  GLUCAP 200* 178* 190* 329* 225*   D-Dimer No results for input(s): DDIMER in the last 72 hours. Hgb A1c No results for input(s): HGBA1C in the last 72 hours. Lipid Profile No results for input(s): CHOL, HDL, LDLCALC, TRIG, CHOLHDL, LDLDIRECT in the last 72 hours. Thyroid function studies No results for input(s): TSH, T4TOTAL, T3FREE, THYROIDAB in the last 72 hours.  Invalid input(s): FREET3 Anemia work up No results for input(s): VITAMINB12, FOLATE, FERRITIN, TIBC, IRON, RETICCTPCT in the last 72 hours. Urinalysis    Component Value Date/Time   COLORURINE YELLOW 12/12/2019 1030   APPEARANCEUR HAZY (A) 12/12/2019 1030   LABSPEC 1.015 12/12/2019 1030   PHURINE 6.0 12/12/2019 1030   GLUCOSEU NEGATIVE 12/12/2019 1030   HGBUR NEGATIVE 12/12/2019 1030   BILIRUBINUR NEGATIVE 12/12/2019 1030   KETONESUR NEGATIVE 12/12/2019 1030   PROTEINUR NEGATIVE 12/12/2019 1030   UROBILINOGEN 0.2 08/07/2015 1826   NITRITE NEGATIVE 12/12/2019 1030   LEUKOCYTESUR SMALL (A) 12/12/2019 1030   Sepsis Labs Invalid input(s): PROCALCITONIN,  WBC,  LACTICIDVEN Microbiology Recent Results (from the past 240 hour(s))  SARS CORONAVIRUS 2 (TAT 6-24 HRS) Nasopharyngeal Nasopharyngeal Swab     Status: None   Collection Time: 12/06/19  9:56 PM   Specimen: Nasopharyngeal Swab  Result Value Ref Range Status   SARS Coronavirus 2 NEGATIVE NEGATIVE Final    Comment: (NOTE) SARS-CoV-2 target nucleic acids are NOT DETECTED. The SARS-CoV-2 RNA is generally detectable in upper and lower respiratory specimens during the acute phase of infection. Negative results do not preclude SARS-CoV-2 infection, do not rule out co-infections with other pathogens, and should not be used as the sole basis for treatment or other patient management decisions. Negative results must be combined with clinical observations, patient history, and epidemiological information. The expected result is Negative. Fact Sheet  for Patients: SugarRoll.be Fact Sheet for Healthcare Providers: https://www.woods-mathews.com/ This test is not yet approved or cleared by the [place] FDA and  has been authorized for detection and/or diagnosis of SARS-CoV-2 by FDA under an Emergency Use Authorization (EUA). This EUA will remain  in effect (meaning this test can be used) for the duration of the COVID-19 declaration under Section 56 4(b)(1)  of the Act, 21 U.S.C. section 360bbb-3(b)(1), unless the authorization is terminated or revoked sooner. Performed at [facility], [place] [address], [place], [place] [address]   Culture, Urine     Status: Abnormal   Collection Time: 12/07/19  9:23 PM   Specimen: Urine, Random  Result Value Ref Range Status   Specimen Description URINE, RANDOM  Final   Special Requests   Final    NONE Performed at [facility], [place] [address] [place], [place] [address]    Culture MULTIPLE SPECIES PRESENT, SUGGEST RECOLLECTION (A)  Final   Report Status 12/08/2019 FINAL  Final  Culture, Urine     Status: Abnormal (Preliminary result)   Collection Time: 12/12/19  7:13 AM   Specimen: Urine, Random  Result Value Ref Range Status   Specimen Description URINE, RANDOM  Final   Special Requests NONE  Final   Culture (A)  Final    >=100,000 COLONIES/mL CITROBACTER KOSERI CULTURE REINCUBATED FOR BETTER GROWTH Performed at [facility], [place] [address] [place], [place] [address]    Report Status PENDING  Incomplete   Organism ID, Bacteria CITROBACTER KOSERI (A)  Final      Susceptibility   Citrobacter koseri - MIC*    CEFAZOLIN <=4 SENSITIVE Sensitive     CEFTRIAXONE <=0.25 SENSITIVE Sensitive     CIPROFLOXACIN <=0.25 SENSITIVE Sensitive     GENTAMICIN <=1 SENSITIVE Sensitive     IMIPENEM <=0.25 SENSITIVE Sensitive     NITROFURANTOIN 32 SENSITIVE Sensitive     TRIMETH/SULFA <=20 SENSITIVE Sensitive     PIP/TAZO <=4 SENSITIVE Sensitive      * >=100,000 COLONIES/mL CITROBACTER KOSERI     Time coordinating discharge: Over 30 minutes  SIGNED:   Charlynne Cousins, MD  [facility] 12/14/2019, 10:52 AM Pager   If 7PM-7AM, please contact night-coverage [email or web address] Password [email or web address]

## 2019-12-14 NOTE — PMR Pre-admission (Addendum)
PMR Admission Coordinator Pre-Admission Assessment  Patient: [name] is an 86 y.o., female MRN: [id number] DOB: [birth year] Height: 5' 7"  (170.2 cm)(approximately 5'7") Weight: 78.8 kg              Insurance Information HMO:    PPO: yes     PCP:      IPA:      80/20:      OTHER:  PRIMARY: Stephen      Policy#: [id number]      Subscriber: pt CM Name: Wells Guiles      Phone#: general number is [phone] option 3     Fax#: [phone] Pre-Cert#: [id number] Candace Cruise # [id number] approved for 7 days      Employer:  Benefits:  Phone #: [phone]     Name: [place]. Date: 10/21/2019     Deduct: none      Out of Pocket Max: $3900      Life Max: none CIR: $345 co pay per day days 1 until 5      SNF: no copay days 1 until 20; $184 co pay per day days 21 until 42; no co pay days 43 until 100 Outpatient: $35 per visit     Co-Pay: visits per medical necessity Home Health: 100%      Co-Pay: visits per medical neccesity DME: 80%     Co-Pay: 20% Providers: in network  SECONDARY: none       Medicaid Application Date:       Case Manager:  Disability Application Date:       Case Worker:   The "Data Collection Information Summary" for patients in Inpatient Rehabilitation Facilities with attached "Privacy Act [place] Records" was provided and verbally reviewed with: Family  Emergency Contact Information Contact Information     Name Relation Home Work Pelzer Daughter [phone]  [phone]   Jones,Cynthia Daughter [phone]        Current Medical History  Patient Admitting Diagnosis: CVA  History of Present Illness: 86 year old right-handed female with history of CAD with stenting, CKD stage III with creatinine 1.56, CVA maintained on Plavix, memory loss maintained on Aricept followed by Dr. Jannifer Franklin, heart block with pacemaker, hypertension, type 2 diabetes mellitus, OSA with CPAP, chronic normocytic anemia, chronic diastolic congestive  heart failure.  Presented 12/07/2019 after a fall without loss of consciousness.  She was noted to have slurred speech and left hemiparesis.  Cranial CT scan unremarkable for acute intracranial process.  Patient did not receive TPA.  MRI not completed due to pacemaker.  Echocardiogram with ejection fraction of 55% without emboli.  Neurology follow-up currently maintained on aspirin and Plavix for CVA prophylaxis subcutaneous Lovenox for DVT prophylaxis.  Admission chemistries with creatinine 1.62, SARS coronavirus negative.  Patient is currently maintained on a dysphagia #2 thin liquid diet.  Urinalysis study 12/12/2019 greater than 100,000 Citrobacter placed on Merrem 12/13/2019.     Complete NIHSS TOTAL: 13 Glasgow Coma Scale Score: 14  Past Medical History  Past Medical History:  Diagnosis Date   Anemia    Arthritis    Celiac disease    Chronic systolic CHF (congestive heart failure) (HCC)    Complete heart block (HCC)    Coronary artery disease    Diabetes mellitus    INSULIN DEPENDENT   GERD (gastroesophageal reflux disease)    Glaucoma    Headache(784.0)    Heart disease    Hypercholesterolemia    Hypertension    Iron  deficiency anemia, unspecified    Ischemic cardiomyopathy    Memory difficulties 04/14/2019   Shortness of breath    Sleep apnea    uses cpap   Stroke [facility])    Unspecified constipation     Family History  family history includes Cancer in her sister; Dementia in her sister; Diabetes in her mother; Hyperlipidemia in her mother; Hypertension in her mother; Neuropathy in her sister.  Prior Rehab/Hospitalizations:  Has the patient had prior rehab or hospitalizations prior to admission? Yes  Has the patient had major surgery during 100 days prior to admission? No  Current Medications   Current Facility-Administered Medications:    acetaminophen (TYLENOL) tablet 650 mg, 650 mg, Oral, Q4H PRN, 650 mg at 12/07/19 1618 **OR** acetaminophen (TYLENOL) 160 MG/5ML  solution 650 mg, 650 mg, Per Tube, Q4H PRN **OR** acetaminophen (TYLENOL) suppository 650 mg, 650 mg, Rectal, Q4H PRN, Tu, Ching T, DO   amLODipine (NORVASC) tablet 2.5 mg, 2.5 mg, Oral, Daily, Ez[place]uka, Adline Peals, MD, 2.5 mg at 12/14/19 2025   aspirin EC tablet 81 mg, 81 mg, Oral, Daily, Alma Friendly, MD, 81 mg at 12/14/19 0955   atorvastatin (LIPITOR) tablet 80 mg, 80 mg, Oral, q morning - 10a, Tu, Ching T, DO, 80 mg at 12/14/19 0955   carvedilol (COREG) tablet 3.125 mg, 3.125 mg, Oral, BID, Alma Friendly, MD, 3.125 mg at 12/14/19 4270   clopidogrel (PLAVIX) tablet 75 mg, 75 mg, Oral, Daily, Tu, Ching T, DO, 75 mg at 12/14/19 0955   donepezil (ARICEPT) tablet 10 mg, 10 mg, Oral, QHS, Tu, Ching T, DO, 10 mg at 12/13/19 2113   enoxaparin (LOVENOX) injection 30 mg, 30 mg, Subcutaneous, Daily, Tu, Ching T, DO, 30 mg at 12/14/19 1000   ferrous sulfate tablet 325 mg, 325 mg, Oral, Q breakfast, Tu, Ching T, DO, 325 mg at 12/14/19 0820   insulin aspart (novoLOG) injection 0-5 Units, 0-5 Units, Subcutaneous, QHS, Charlynne Cousins, MD   insulin aspart (novoLOG) injection 0-9 Units, 0-9 Units, Subcutaneous, TID WC, Charlynne Cousins, MD   insulin aspart (novoLOG) injection 2 Units, 2 Units, Subcutaneous, TID WC, Charlynne Cousins, MD   insulin glargine (LANTUS) injection 10 Units, 10 Units, Subcutaneous, BID, Charlynne Cousins, MD, 10 Units at 12/14/19 1050   ipratropium-albuterol (DUONEB) 0.5-2.5 (3) MG/3ML nebulizer solution 3 mL, 3 mL, Nebulization, Q6H PRN, Alma Friendly, MD   meropenem (MERREM) 1 g in sodium chloride 0.9 % 100 mL IVPB, 1 g, Intravenous, Q12H, Pierce, Dwayne A, RPH, Stopped at 12/14/19 1036   ondansetron (ZOFRAN) injection 4 mg, 4 mg, Intravenous, Q8H PRN, Alma Friendly, MD, 4 mg at 12/13/19 1244   pantoprazole (PROTONIX) EC tablet 40 mg, 40 mg, Oral, QAC breakfast, Tu, Ching T, DO, 40 mg at 12/14/19 0820   polyethylene glycol (MIRALAX /  GLYCOLAX) packet 17 g, 17 g, Oral, BID, Alma Friendly, MD, 17 g at 12/14/19 1000   senna-docusate (Senokot-S) tablet 1 tablet, 1 tablet, Oral, BID, Alma Friendly, MD, 1 tablet at 12/14/19 6237  Patients Current Diet:  Diet Order             DIET DYS 2 Room service appropriate? No; Fluid consistency: Thin  Diet effective now                Precautions / Restrictions Precautions Precautions: Fall Precaution Comments: R knee buckling and scissoring. Restrictions Weight Bearing Restrictions: No   Has the patient had 2 or  more falls or a fall with injury in the past year?Yes  Prior Activity Level Limited Community (1-2x/wk): sueprvision to min assist with SPC  Prior Functional Level Prior Function Level of Independence: Needs assistance Gait / Transfers Assistance Needed: pt reports use of SPC for mobility ADL's / Homemaking Assistance Needed: pt's daughter assists with bathing Comments: aide M-F 8 hours a day (? pt difficulty reporting time) helps with BADL  Self Care: Did the patient need help bathing, dressing, using the toilet or eating?  Needed some help  Indoor Mobility: Did the patient need assistance with walking from room to room (with or without device)? Needed some help  Stairs: Did the patient need assistance with internal or external stairs (with or without device)? Needed some help  Functional Cognition: Did the patient need help planning regular tasks such as shopping or remembering to take medications? Needed some help  Home Assistive Devices / Equipment Home Assistive Devices/Equipment: Kasandra Knudsen (specify quad or straight)(Straight cane) Home Equipment: Cane - single point, Bedside commode, Shower seat  Prior Device Use: Indicate devices/aids used by the patient prior to current illness, exacerbation or injury?  spc  Current Functional Level Cognition  Arousal/Alertness: Awake/alert Overall Cognitive Status: History of cognitive impairments -  at baseline Orientation Level: Oriented to person, Oriented to place, Disoriented to time, Oriented to situation Safety/Judgement: Decreased awareness of safety, Decreased awareness of deficits General Comments: pt requiring cues and support for multistep tasks for safety Attention: Sustained Sustained Attention: Appears intact Memory: Impaired Memory Impairment: Prospective memory, Decreased short term memory Decreased Short Term Memory: Verbal basic    Extremity Assessment (includes Sensation/Coordination)  Upper Extremity Assessment: Defer to OT evaluation  Lower Extremity Assessment: RLE deficits/detail, LLE deficits/detail RLE Deficits / Details: Strength 5/5 LLE Deficits / Details: Strength 5/5    ADLs  Overall ADL's : Needs assistance/impaired Eating/Feeding: Maximal assistance, With adaptive utensils Eating/Feeding Details (indicate cue type and reason): Set-up for eating, able to find adaptive fork (built up) however unable to load or bring to mouth Grooming: Set up, Sitting Grooming Details (indicate cue type and reason): attempted to stand, pt could not sustain with R knee buckle and needing to sit Upper Body Bathing: Minimal assistance, Sitting Lower Body Bathing: Total assistance, +2 for safety/equipment Lower Body Bathing Details (indicate cue type and reason): Completed in sitting, pt unable to bathe feet due to decreased balance Upper Body Dressing : Minimal assistance, Sitting Lower Body Dressing: Sitting/lateral leans, Total assistance Lower Body Dressing Details (indicate cue type and reason): put unable to maintain balance to reach down to doff socks in sitting Toilet Transfer: +2 for physical assistance, Maximal assistance, Total assistance, Cueing for safety, Cueing for sequencing, BSC Toilet Transfer Details (indicate cue type and reason): Used stedy to transfer 2x to BSC, max to total A of 2 as session progressed due to fatigue Toileting- Clothing Manipulation  and Hygiene: Cueing for safety, Cueing for sequencing, +2 for physical assistance, Total assistance Toileting - Clothing Manipulation Details (indicate cue type and reason): Total A for peri care in session, pt unable to maintain balance in stting Functional mobility during ADLs: Maximal assistance, Total assistance, +2 for physical assistance, +2 for safety/equipment, Cueing for sequencing, Cueing for safety General ADL Comments: Pt now requring stedy, plus 2 assist in order to complete ADLs, pt was noted to get progressively weaker as session continued    Mobility  Overal bed mobility: Needs Assistance Bed Mobility: Rolling, Sidelying to Sit Rolling: Mod assist, +2 for physical  assistance Sidelying to sit: Mod assist, +2 for physical assistance Supine to sit: Mod assist General bed mobility comments: Pt required assistance to flex R knee into hooklying and push to roll into sidelying.  PTA provided Hand over Hand assistance with RUE.  HOB elevated to push into sitting.  Pt is slow and guarded with poor sitting balance.  She required total assistance to scoot hips forward.    Transfers  Overall transfer level: Needs assistance Equipment used: Ambulation equipment used, None(sara stedy) Transfer via Lift Equipment: Stedy Transfers: Sit to/from Stand, Risk manager Sit to Stand: Max assist, Total assist, +2 physical assistance Stand pivot transfers: Total assist, +2 physical assistance General transfer comment: Pt seated edge of bed on arrival and needed to toilet  She presents with decreased strength and poor balance and required total assistance to move from bed  to bed side commode.  Pt then utilized sara stedy for remainder of transfers this session.  She required increased assistance on her R side to protect her RUE and maintain balance.  Strong R lateral lean noted  in sitting on sara stedy and in standing she presented with R forward lean with excessive hip flexion.    Ambulation  / Gait / Stairs / Wheelchair Mobility  Ambulation/Gait Ambulation/Gait assistance: (pt remains to decline functionally and gt remains unsafe at this time.) Gait Distance (Feet): 4 Feet(attempted steps from bed to recliner and she was increasingly unsteady with noted scissoring of RLE past midline.  She was able to take steps to recliner but then required recliner to be brought to her bottom as she became increasingly unsteady.) Assistive device: Rolling walker (2 wheeled) Gait Pattern/deviations: Step-through pattern, Decreased stride length, Trunk flexed, Scissoring General Gait Details: Pt with increased trunk flexion,  and strong R lateral lean.  She was unable to complete steps to recliner before requiring chair to be pulled up to her bottom. Gait velocity: decreased    Posture / Balance Dynamic Sitting Balance Sitting balance - Comments: cannot tolerate challenge Balance Overall balance assessment: Needs assistance Sitting-balance support: Feet supported Sitting balance-Leahy Scale: Poor Sitting balance - Comments: cannot tolerate challenge Standing balance support: Bilateral upper extremity supported, During functional activity Standing balance-Leahy Scale: Zero Standing balance comment: Pt with poor balance in inital stands, as trials continued zero balance noted    Special needs/care consideration BiPAP/CPAP yes pta CPM Continuous Drip IV Dialysis         Life Vest Oxygen Special Bed Trach Size Wound Vac  Skin right heel stage 1; coccyx stage 1 Bowel mgmt: incontinent Bladder mgmt: incontinent has external catheter Diabetic mgmt Hgb A1c 8.2 Behavioral consideration  Chemo/radiation  Designated visitor is daughter, [name]       DNR [place] Arrangements: Other (Comment)  Lives With: (daughter lives next door, son and daughter to asisst) Available Help at Discharge: Family, Available 24 hours/day(son, daughter and aide to  assist) Type of Home: House Home Layout: One level Home Access: Level entry Bathroom Shower/Tub: Chiropodist: Standard Bathroom Accessibility: Yes How Accessible: Accessible via walker Home Care Services: No  Discharge Living Setting Plans for Discharge Living Setting: Patient's home, Alone(with family coming in from next door, [place]) Type of Home at Discharge: House Discharge Home Layout: One level Discharge Home Access: Level entry Discharge Bathroom Shower/Tub: Tub/shower unit Discharge Bathroom Toilet: Standard Discharge Bathroom Accessibility: Yes How Accessible: Accessible via walker Does the patient have any problems obtaining your medications?: No  Social/Family/Support Systems Patient Roles: Parent Contact Information: [name], daughter is main contact Anticipated Caregiver: daughter, son and aide Anticipated Caregiver's Contact Information: see above Ability/Limitations of Caregiver: no limitations; [name] lives next door Caregiver Availability: 24/7 Discharge Plan Discussed with Primary Caregiver: Yes Is Caregiver In Agreement with Plan?: Yes Does Caregiver/Family have Issues with Lodging/Transportation while Pt is in Rehab?: No  Daughter states she will have an aide to assist with her care. When asked what company and I explained that her insurance would not cover aide services for 8 hrs per day. Daughter denies patient has Medicaid.  Goals/Additional Needs Patient/Family Goal for Rehab: min asisst with PT, min to mod assist with OT, supervision SLP Expected length of stay: ELOS 3 to 3 weeks Pt/Family Agrees to Admission and willing to participate: Yes Program Orientation Provided & Reviewed with Pt/Caregiver Including Roles  & Responsibilities: Yes  Decrease burden of Care through IP rehab admission: No Possible need for SNF placement upon discharge: Not anticipated  Patient Condition: This patient's medical and functional status has  changed since the consult dated 12/08/2019 in which the Rehabilitation Physician determined and documented that the patient was potentially appropriate for intensive rehabilitative care in an inpatient rehabilitation facility. Issues have been addressed and update has been discussed with Dr. Posey Pronto and patient now appropriate for inpatient rehabilitation. Will admit to inpatient rehab today.   Preadmission Screen Completed By:  Cleatrice Burke, RN, 12/14/2019 11:16 AM ______________________________________________________________________   Discussed status with Dr. Posey Pronto on 12/14/2019 at 1200 and received approval for admission today.  Admission Coordinator:  Cleatrice Burke, time 1200 Date 12/14/2019

## 2019-12-14 NOTE — Progress Notes (Addendum)
[facility] PROGRESS NOTE    Progress Note  [name]  HLK:[id number] DOB: July 27, 1928 DOA: 12/06/2019 PCP: [name], MD     Brief Narrative:   [name] is an 86 y.o. female past medical history significant for CAD, CVA on Plavix heart block with a pacemaker diabetes mellitus type 2 obstructive sleep apnea on CPAP who presents for fall.  She denied any dizziness of loss consciousness.  Her symptoms resolved on arrival to the CT of the head was negative for any acute abnormalities.  MRI of the brain cannot be performed due to pacemaker being incompatible.  Assessment/Plan:   Right-sided hemiparesis likely due to CVA: Neurology was consulted. HgbA1c 8.2, fasting lipid panel LDL greater than 100. MRI, MRA cannot be performed. CT scan of the head showed no acute findings. PT, OT, Speech consult physical therapy recommended CIR, swallowing evaluation done continue current diet.  Speech recommended a dysphagia 2 diet. Carotid dopplers  Transthoracic Echo, an EF of 50% no wall motion abnormality. He was started on Lipitor. Start patient on ASA 52m daily and plavix 766mdaily  Telemetry monitoring   Chronic kidney disease stage III: Noted to have some mild hypercalcemia holding calcitriol continue daily Bumex.  Chronic normocytic anemia: Hemoglobin at baseline.  Diabetes mellitus type 2: With an A1c of 8.2, continue long-acting insulin plus sliding scale.  Essential hypertension: Continue amlodipine and Coreg holding ARB and HCTZ.  CAD/lipidemia: Continue Coreg and statins.  Complete heart block status post pacemaker.  Mild cognitive impairment: Continue Aricept.  Present on admission heel decubitus ulcer stage I RN Pressure Injury Documentation: Pressure Injury 12/11/19 Heel Right Stage 1 -  Intact skin with non-blanchable redness of a localized area usually over a bony prominence. Errythemous, boggy (Active)  12/11/19 1127  Location: Heel   Location Orientation: Right  Staging: Stage 1 -  Intact skin with non-blanchable redness of a localized area usually over a bony prominence.  Wound Description (Comments): Errythemous, boggy  Present on Admission: No     Pressure Injury 12/12/19 Coccyx Mid Stage 1 -  Intact skin with non-blanchable redness of a localized area usually over a bony prominence. very tiny open area between buttocks (Active)  12/12/19 1630  Location: Coccyx  Location Orientation: Mid  Staging: Stage 1 -  Intact skin with non-blanchable redness of a localized area usually over a bony prominence.  Wound Description (Comments): very tiny open area between buttocks  Present on Admission:     Estimated body mass index is 27.21 kg/m as calculated from the following:   Height as of this encounter: 5' 7"  (1.702 m).   Weight as of this encounter: 78.8 kg.   DVT prophylaxis: lovenox Family Communication:none Disposition Plan/Barrier to D/C: Awaiting CIR placement.  Code Status:     Code Status Orders  (From admission, onward)         Start     Ordered   12/06/19 2337  Do not attempt resuscitation (DNR)  Continuous    Question Answer Comment  In the event of cardiac or respiratory ARREST Do not call a "code blue"   In the event of cardiac or respiratory ARREST Do not perform Intubation, CPR, defibrillation or ACLS   In the event of cardiac or respiratory ARREST Use medication by any route, position, wound care, and other measures to relive pain and suffering. May use oxygen, suction and manual treatment of airway obstruction as needed for comfort.      12/06/19 2339  Code Status History    Date Active Date Inactive Code Status Order ID Comments User Context   10/09/2019 2137 10/10/2019 1538 Full Code [id number]  [name], DO ED   07/02/2019 0405 07/02/2019 2236 Full Code [id number]  [name], MD Inpatient   06/29/2019 1026 06/29/2019 1352 Full Code [id number]  Evans Lance, MD Inpatient    10/16/2012 2354 10/25/2012 1933 Full Code [id number]  Theressa Millard, MD ED   Advance Care Planning Activity        IV Access:    Peripheral IV   Procedures and diagnostic studies:   DG Abd Portable 1V  Result Date: 12/13/2019 CLINICAL DATA:  Nausea, vomiting. EXAM: PORTABLE ABDOMEN - 1 VIEW COMPARISON:  None. FINDINGS: The bowel gas pattern is normal. Probable multiple calcified uterine fibroids are noted. IMPRESSION: No evidence of bowel obstruction or ileus. Electronically Signed   By: Marijo Conception M.D.   On: 12/13/2019 14:55     Medical Consultants:    None.  Anti-Infectives:   none  Subjective:    [name] she has no new complaints.  Objective:    Vitals:   12/13/19 2044 12/13/19 2210 12/14/19 0409 12/14/19 0803  BP: (!) 135/46  (!) 134/59 (!) 134/53  Pulse: 79 79 69 78  Resp: 18 18 18 18   Temp: 99.1 F (37.3 C)  98.9 F (37.2 C) 98.7 F (37.1 C)  TempSrc: Oral  Oral Oral  SpO2: 100% 100% 96% 93%  Weight:      Height:       SpO2: 93 %   Intake/Output Summary (Last 24 hours) at 12/14/2019 0948 Last data filed at 12/14/2019 0510 Gross per 24 hour  Intake 260 ml  Output 1350 ml  Net -1090 ml   Filed Weights   12/07/19 0200  Weight: 78.8 kg    Exam: General exam: In no acute distress. Respiratory system: Good air movement and clear to auscultation. Cardiovascular system: S1 & S2 heard, RRR. No JVD.  Gastrointestinal system: Abdomen is nondistended, soft and nontender.  Central nervous system: Alert and oriented x2 Extremities: No pedal edema. Skin: No rashes, lesions or ulcers  Data Reviewed:    Labs: Basic Metabolic Panel: Recent Labs  Lab 12/10/19 0341 12/10/19 0341 12/11/19 0346 12/11/19 0346 12/12/19 0309 12/12/19 0309 12/13/19 0338 12/14/19 0552  NA 139  --  140  --  137  --  137 142  K 4.5   < > 4.2   < > 4.5   < > 4.5 4.5  CL 102  --  103  --  100  --  102 105  CO2 28  --  28  --  25  --  27 29   GLUCOSE 182*  --  183*  --  246*  --  230* 232*  BUN 20  --  20  --  24*  --  21 27*  CREATININE 1.29*  --  1.22*  --  1.42*  --  1.21* 1.37*  CALCIUM 11.5*  --  11.1*  --  11.8*  --  11.4* 12.2*   < > = values in this interval not displayed.   GFR Estimated Creatinine Clearance: 30.1 mL/min (A) (by C-G formula based on SCr of 1.37 mg/dL (H)). Liver Function Tests: No results for input(s): AST, ALT, ALKPHOS, BILITOT, PROT, ALBUMIN in the last 168 hours. No results for input(s): LIPASE, AMYLASE in the last 168 hours. No results for input(s): AMMONIA in  the last 168 hours. Coagulation profile No results for input(s): INR, PROTIME in the last 168 hours. COVID-19 Labs  No results for input(s): DDIMER, FERRITIN, LDH, CRP in the last 72 hours.  Lab Results  Component Value Date   SARSCOV2NAA NEGATIVE 12/06/2019   [place] NEGATIVE 10/09/2019   [place] NEGATIVE 07/01/2019   SARSCOV2NAA NOT DETECTED 06/24/2019    CBC: Recent Labs  Lab 12/10/19 0341 12/11/19 0346 12/12/19 0309 12/13/19 0338 12/14/19 0552  WBC 8.1 7.2 12.3* 12.6* 10.7*  NEUTROABS 3.7 3.6 9.8* 9.1* 7.5  HGB 10.7* 10.4* 10.8* 10.1* 10.1*  HCT 34.0* 32.9* 34.4* 32.2* 32.3*  MCV 92.1 91.9 92.7 91.7 92.3  PLT 217 227 245 214 212   Cardiac Enzymes: No results for input(s): CKTOTAL, CKMB, CKMBINDEX, TROPONINI in the last 168 hours. BNP (last 3 results) No results for input(s): PROBNP in the last 8760 hours. CBG: Recent Labs  Lab 12/13/19 0610 12/13/19 1252 12/13/19 1605 12/13/19 2128 12/14/19 0637  GLUCAP 200* 178* 190* 329* 225*   D-Dimer: No results for input(s): DDIMER in the last 72 hours. Hgb A1c: No results for input(s): HGBA1C in the last 72 hours. Lipid Profile: No results for input(s): CHOL, HDL, LDLCALC, TRIG, CHOLHDL, LDLDIRECT in the last 72 hours. Thyroid function studies: No results for input(s): TSH, T4TOTAL, T3FREE, THYROIDAB in the last 72 hours.  Invalid input(s):  FREET3 Anemia work up: No results for input(s): VITAMINB12, FOLATE, FERRITIN, TIBC, IRON, RETICCTPCT in the last 72 hours. Sepsis Labs: Recent Labs  Lab 12/11/19 0346 12/12/19 0309 12/13/19 0338 12/14/19 0552  WBC 7.2 12.3* 12.6* 10.7*   Microbiology Recent Results (from the past 240 hour(s))  SARS CORONAVIRUS 2 (TAT 6-24 HRS) Nasopharyngeal Nasopharyngeal Swab     Status: None   Collection Time: 12/06/19  9:56 PM   Specimen: Nasopharyngeal Swab  Result Value Ref Range Status   SARS Coronavirus 2 NEGATIVE NEGATIVE Final    Comment: (NOTE) SARS-CoV-2 target nucleic acids are NOT DETECTED. The SARS-CoV-2 RNA is generally detectable in upper and lower respiratory specimens during the acute phase of infection. Negative results do not preclude SARS-CoV-2 infection, do not rule out co-infections with other pathogens, and should not be used as the sole basis for treatment or other patient management decisions. Negative results must be combined with clinical observations, patient history, and epidemiological information. The expected result is Negative. Fact Sheet for Patients: SugarRoll.be Fact Sheet for Healthcare Providers: https://www.woods-mathews.com/ This test is not yet approved or cleared by the [place] FDA and  has been authorized for detection and/or diagnosis of SARS-CoV-2 by FDA under an Emergency Use Authorization (EUA). This EUA will remain  in effect (meaning this test can be used) for the duration of the COVID-19 declaration under Section 56 4(b)(1) of the Act, 21 U.S.C. section 360bbb-3(b)(1), unless the authorization is terminated or revoked sooner. Performed at [facility], [place] [address], [place], [place] [address]   Culture, Urine     Status: Abnormal   Collection Time: 12/07/19  9:23 PM   Specimen: Urine, Random  Result Value Ref Range Status   Specimen Description URINE, RANDOM  Final   Special  Requests   Final    NONE Performed at [facility], [place] [address], [place], [place] [address]    Culture MULTIPLE SPECIES PRESENT, SUGGEST RECOLLECTION (A)  Final   Report Status 12/08/2019 FINAL  Final  Culture, Urine     Status: Abnormal (Preliminary result)   Collection Time: 12/12/19  7:13 AM   Specimen: Urine, Random  Result Value Ref Range Status   Specimen Description URINE, RANDOM  Final   Special Requests NONE  Final   Culture (A)  Final    >=100,000 COLONIES/mL CITROBACTER KOSERI CULTURE REINCUBATED FOR BETTER GROWTH Performed at [facility], [address] [place], [place] [address]    Report Status PENDING  Incomplete   Organism ID, Bacteria CITROBACTER KOSERI (A)  Final      Susceptibility   Citrobacter koseri - MIC*    CEFAZOLIN <=4 SENSITIVE Sensitive     CEFTRIAXONE <=0.25 SENSITIVE Sensitive     CIPROFLOXACIN <=0.25 SENSITIVE Sensitive     GENTAMICIN <=1 SENSITIVE Sensitive     IMIPENEM <=0.25 SENSITIVE Sensitive     NITROFURANTOIN 32 SENSITIVE Sensitive     TRIMETH/SULFA <=20 SENSITIVE Sensitive     PIP/TAZO <=4 SENSITIVE Sensitive     * >=100,000 COLONIES/mL CITROBACTER KOSERI     Medications:   . amLODipine  2.5 mg Oral Daily  . aspirin EC  81 mg Oral Daily  . atorvastatin  80 mg Oral q morning - 10a  . carvedilol  3.125 mg Oral BID  . clopidogrel  75 mg Oral Daily  . donepezil  10 mg Oral QHS  . enoxaparin (LOVENOX) injection  30 mg Subcutaneous Daily  . ferrous sulfate  325 mg Oral Q breakfast  . insulin aspart  0-15 Units Subcutaneous TID WC  . insulin aspart  3 Units Subcutaneous TID WC  . insulin glargine  8 Units Subcutaneous BID  . pantoprazole  40 mg Oral QAC breakfast  . polyethylene glycol  17 g Oral BID  . senna-docusate  1 tablet Oral BID   Continuous Infusions: . meropenem (MERREM) IV Stopped (12/13/19 2212)      LOS: 8 days   Charlynne Cousins  [facility]  12/14/2019, 9:48 AM

## 2019-12-15 ENCOUNTER — Inpatient Hospital Stay (HOSPITAL_COMMUNITY): Payer: Medicare Other | Admitting: Occupational Therapy

## 2019-12-15 ENCOUNTER — Inpatient Hospital Stay (HOSPITAL_COMMUNITY): Payer: Medicare Other

## 2019-12-15 ENCOUNTER — Inpatient Hospital Stay (HOSPITAL_COMMUNITY): Payer: Medicare Other | Admitting: Speech Pathology

## 2019-12-15 ENCOUNTER — Inpatient Hospital Stay (HOSPITAL_COMMUNITY): Payer: Medicare Other | Admitting: Physical Therapy

## 2019-12-15 DIAGNOSIS — I639 Cerebral infarction, unspecified: Secondary | ICD-10-CM

## 2019-12-15 LAB — COMPREHENSIVE METABOLIC PANEL
ALT: 33 U/L (ref 0–44)
AST: 38 U/L (ref 15–41)
Albumin: 3.1 g/dL — ABNORMAL LOW (ref 3.5–5.0)
Alkaline Phosphatase: 61 U/L (ref 38–126)
Anion gap: 8 (ref 5–15)
BUN: 30 mg/dL — ABNORMAL HIGH (ref 8–23)
CO2: 28 mmol/L (ref 22–32)
Calcium: 12.2 mg/dL — ABNORMAL HIGH (ref 8.9–10.3)
Chloride: 105 mmol/L (ref 98–111)
Creatinine, Ser: 1.26 mg/dL — ABNORMAL HIGH (ref 0.44–1.00)
GFR calc Af Amer: 44 mL/min — ABNORMAL LOW (ref >60)
GFR calc non Af Amer: 38 mL/min — ABNORMAL LOW (ref >60)
Glucose, Bld: 283 mg/dL — ABNORMAL HIGH (ref 70–99)
Potassium: 4.4 mmol/L (ref 3.5–5.1)
Sodium: 141 mmol/L (ref 135–145)
Total Bilirubin: 0.2 mg/dL — ABNORMAL LOW (ref 0.3–1.2)
Total Protein: 6.5 g/dL (ref 6.5–8.1)

## 2019-12-15 LAB — CBC WITH DIFFERENTIAL/PLATELET
Abs Immature Granulocytes: 0.04 10*3/uL (ref 0.00–0.07)
Basophils Absolute: 0 10*3/uL (ref 0.0–0.1)
Basophils Relative: 0 %
Eosinophils Absolute: 0 10*3/uL (ref 0.0–0.5)
Eosinophils Relative: 0 %
HCT: 32.4 % — ABNORMAL LOW (ref 36.0–46.0)
Hemoglobin: 10 g/dL — ABNORMAL LOW (ref 12.0–15.0)
Immature Granulocytes: 0 %
Lymphocytes Relative: 19 %
Lymphs Abs: 1.9 10*3/uL (ref 0.7–4.0)
MCH: 28.8 pg (ref 26.0–34.0)
MCHC: 30.9 g/dL (ref 30.0–36.0)
MCV: 93.4 fL (ref 80.0–100.0)
Monocytes Absolute: 0.9 10*3/uL (ref 0.1–1.0)
Monocytes Relative: 9 %
Neutro Abs: 7.3 10*3/uL (ref 1.7–7.7)
Neutrophils Relative %: 72 %
Platelets: 231 10*3/uL (ref 150–400)
RBC: 3.47 MIL/uL — ABNORMAL LOW (ref 3.87–5.11)
RDW: 12.7 % (ref 11.5–15.5)
WBC: 10.2 10*3/uL (ref 4.0–10.5)
nRBC: 0 % (ref 0.0–0.2)

## 2019-12-15 LAB — GLUCOSE, CAPILLARY
Glucose-Capillary: 232 mg/dL — ABNORMAL HIGH (ref 70–99)
Glucose-Capillary: 262 mg/dL — ABNORMAL HIGH (ref 70–99)
Glucose-Capillary: 262 mg/dL — ABNORMAL HIGH (ref 70–99)
Glucose-Capillary: 154 mg/dL — ABNORMAL HIGH (ref 70–99)
Glucose-Capillary: 223 mg/dL — ABNORMAL HIGH (ref 70–99)

## 2019-12-15 LAB — URINE CULTURE: Culture: >=100000 — AB

## 2019-12-15 MED ORDER — INSULIN GLARGINE 100 UNIT/ML ~~LOC~~ SOLN
12.0000 [IU] | Freq: Two times a day (BID) | SUBCUTANEOUS | Status: DC
  Administered 2019-12-15 – 2019-12-17 (×6): 12 [IU] via SUBCUTANEOUS
  Filled 2019-12-15 (×8): qty 0.12

## 2019-12-15 MED ORDER — CALCITRIOL 0.25 MCG PO CAPS
0.2500 ug | ORAL_CAPSULE | Freq: Every day | ORAL | Status: DC
  Administered 2019-12-15 – 2019-12-17 (×3): 0.25 ug via ORAL
  Filled 2019-12-15 (×4): qty 1
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
## 2019-12-15 NOTE — Care Management (Signed)
Inpatient Rehabilitation Center Individual Statement of Services  Patient Name:  [name]  Date:  12/15/2019  Welcome to the [facility].  Our goal is to provide you with an individualized program based on your diagnosis and situation, designed to meet your specific needs.  With this comprehensive rehabilitation program, you will be expected to participate in at least 3 hours of rehabilitation therapies Monday-Friday, with modified therapy programming on the weekends.  Your rehabilitation program will include the following services:  Physical Therapy (PT), Occupational Therapy (OT), Speech Therapy (ST), 24 hour per day rehabilitation nursing, Neuropsychology, Case Management (Social Worker), Rehabilitation Medicine, Nutrition Services and Pharmacy Services  Weekly team conferences will be held on Wednesdays to discuss your progress.  Your Social Worker/Care Managerwill talk with you frequently to get your input and to update you on team discussions.  Team conferences with you and your family in attendance may also be held.  Expected length of stay: 3 weeks  Overall anticipated outcome: Minimal to Moderate Assistance  Depending on your progress and recovery, your program may change. Your Social Industrial/product designer will coordinate services and will keep you informed of any changes. Your Social Worker's/Care Manager's name and contact numbers are listed  below.  The following services may also be recommended but are not provided by the [facility]:    [place] will be made to provide these services after discharge if needed.  Arrangements include referral to agencies that provide these services.  Your insurance has been verified to be:  [facility] Medicare Your primary doctor is:  Murvin Donning, MD  Pertinent information will be shared with your doctor and your insurance  company.  Social Worker:  Loralee Pacas, [place] or [phone] Care Manager: Dorien Chihuahua, RN [phone] or [phone]  Information discussed with and copy given to patient by: Margarito Liner, 12/15/2019, 4:27 PM

## 2019-12-15 NOTE — Plan of Care (Signed)
  Problem: RH BLADDER ELIMINATION Goal: RH STG MANAGE BLADDER WITH ASSISTANCE Description: STG Manage Bladder With Moderate Assistance Outcome: Not Progressing   Problem: RH SAFETY Goal: RH STG ADHERE TO SAFETY PRECAUTIONS W/ASSISTANCE/DEVICE Description: STG Adhere to Safety Precautions With Moderate Assistance/Device. Outcome: Progressing   Problem: RH COGNITION-NURSING Goal: RH STG USES MEMORY AIDS/STRATEGIES W/ASSIST TO PROBLEM SOLVE Description: STG Uses Memory Aids/Strategies With Moderate Assistance to Problem Solve. Outcome: Progressing

## 2019-12-15 NOTE — Progress Notes (Signed)
[facility] PHYSICAL MEDICINE & REHABILITATION PROGRESS NOTE   Subjective/Complaints:  No issues overnite, pt dysarthric , discussed with SLP, per acute care eval, no aphasia   ROS- denies CP, SOB,  N/V/D  Objective:   DG Abd Portable 1V  Result Date: 12/13/2019 CLINICAL DATA:  Nausea, vomiting. EXAM: PORTABLE ABDOMEN - 1 VIEW COMPARISON:  None. FINDINGS: The bowel gas pattern is normal. Probable multiple calcified uterine fibroids are noted. IMPRESSION: No evidence of bowel obstruction or ileus. Electronically Signed   By: Marijo Conception M.D.   On: 12/13/2019 14:55   Recent Labs    12/14/19 0552 12/15/19 0643  WBC 10.7* 10.2  HGB 10.1* 10.0*  HCT 32.3* 32.4*  PLT 212 231   Recent Labs    12/14/19 0552 12/15/19 0643  NA 142 141  K 4.5 4.4  CL 105 105  CO2 29 28  GLUCOSE 232* 283*  BUN 27* 30*  CREATININE 1.37* 1.26*  CALCIUM 12.2* 12.2*    Intake/Output Summary (Last 24 hours) at 12/15/2019 0824 Last data filed at 12/15/2019 0046 Gross per 24 hour  Intake --  Output 575 ml  Net -575 ml     Physical Exam: Vital Signs Blood pressure (!) 157/65, pulse 71, temperature 98.6 F (37 C), resp. rate 18, height 5' 7"  (1.702 m), weight 78.7 kg, SpO2 97 %.   General: No acute distress Mood and affect are appropriate Heart: Regular rate and rhythm no rubs murmurs or extra sounds Lungs: Clear to auscultation, breathing unlabored, no rales or wheezes Abdomen: Positive bowel sounds, soft nontender to palpation, nondistended Extremities: No clubbing, cyanosis, or edema Skin: No evidence of breakdown, no evidence of rash Neurologic: Cranial nerves II through XII intact, motor strength is 5/5 in Left, 0/5 RIght  deltoid, bicep, tricep, grip, hip flexor, knee extensors, ankle dorsiflexor and plantar flexor Sensory exam normal sensation to light touch  in bilateral upper and lower extremities as well as facial  Right CN VII central weakness No evidence of  nystagmus Musculoskeletal: Full range of motion in all 4 extremities. No joint swelling   Assessment/Plan: 1. Functional deficits secondary to Left brainstem vs subcortical infarct which require 3+ hours per day of interdisciplinary therapy in a comprehensive inpatient rehab setting.  Physiatrist is providing close team supervision and 24 hour management of active medical problems listed below.  Physiatrist and rehab team continue to assess barriers to discharge/monitor patient progress toward functional and medical goals  Care Tool:  Bathing              Bathing assist       Upper Body Dressing/Undressing Upper body dressing   What is the patient wearing?: Hospital gown only    Upper body assist      Lower Body Dressing/Undressing Lower body dressing            Lower body assist       Toileting Toileting    Toileting assist Assist for toileting: Total Assistance - Patient < 25%     Transfers Chair/bed transfer  Transfers assist           Locomotion Ambulation   Ambulation assist              Walk 10 feet activity   Assist           Walk 50 feet activity   Assist           Walk 150 feet activity   Assist  Walk 10 feet on uneven surface  activity   Assist           Wheelchair     Assist               Wheelchair 50 feet with 2 turns activity    Assist            Wheelchair 150 feet activity     Assist          Blood pressure (!) 157/65, pulse 71, temperature 98.6 F (37 C), resp. rate 18, height 5' 7"  (1.702 m), weight 78.7 kg, SpO2 97 %.    Medical Problem List and Plan: 1.  Left side hemiplegia, dysarthria, +/- aphasia secondary to left brain infarction.  MRI not completed due to pacemaker             -patient may shower             -ELOS/Goals: 25-30 days/min/mod a             PT, OT, SLP evals 2.  Antithrombotics: -DVT/anticoagulation: Subcutaneous Lovenox              -antiplatelet therapy: Aspirin 81 mg daily, Plavix 75 mg daily 3. Pain Management: Tylenol as needed 4. Mood with memory deficits.  Aricept 10 mg nightly             -antipsychotic agents: N/A 5. Neuropsych: This patient is not capable of making decisions on her own behalf. 6. Skin/Wound Care: Routine skin checks 7. Fluids/Electrolytes/Nutrition: Routine in and outs.  CMP ordered for tomorrow a.m. 8.  Hypertension.  Norvasc 2.5 mg daily, Coreg 3.125 mg twice daily.               Monitor with increased mobility 9.  Diabetes mellitus with hyperglycemia.  Hemoglobin A1c 8.2.  Lantus insulin 10 units twice daily, NovoLog 2 units 3 times daily check blood sugars before meals and at bedtime              CBG (last 3)  Recent Labs    12/14/19 2059 12/14/19 2216 12/15/19 0606  GLUCAP 304* 250* 262*  increase lantus dose 12U BID  10.  CAD with stenting as well as pacemaker.  Continue aspirin and Plavix. 11.  CKD stage III.  Creatinine baseline 1.56.               BMP ordered for tomorrow a.m. 12.  Chronic diastolic congestive heart failure.  Monitor for any signs of fluid overload             Daily weights 13.  OSA.  CPAP. 14.  Hyperlipidemia.  Lipitor 15.  Chronic normocytic anemia.  Continue iron supplement             CBC ordered for tomorrow a.m. 16.  Post stroke dysphagia: Dysphagia #2 thin liquids.  Follow-up speech therapy.  Advance as tolerated 17.  UTI/Citrobacter S to . Keflex  LOS: 1 days A FACE TO FACE EVALUATION WAS PERFORMED  Charlett Blake 12/15/2019, 8:24 AM

## 2019-12-15 NOTE — Progress Notes (Signed)
This nurse received call from [facility] regarding results of head CT . Per Linna Hoff CT showed small new area in left pons. No new orders .Patient able to answer name, and that she's in [place]. Spot check oxygen level remains in the 90's. See flow sheet. Daughter at bedside. Will continue to monitor.

## 2019-12-15 NOTE — Progress Notes (Signed)
Patient reportedly more lethargic this afternoon with therapies having difficulty making some words vital signs were stable oxygen saturations approximately 88% she was placed back on CPAP.  Chest x-ray was completed that was unremarkable.  CT of the head showed a new hypoattenuating area within the left pons concerning for developing infarct.  Results were discussed with neurology services Dr.Xu and advise no change in plan of care to continue aspirin and Plavix therapy.  MRI could not be completed due to pacemaker.  All issues were discussed with daughter.  Patient appears to have returned back to baseline and daughter agreed.

## 2019-12-15 NOTE — Plan of Care (Signed)
Discussed with CIR PA Quillian Quince over the phone. Pt new CT today showed left pontine hypodensity. Comparing with her CT head 2/18, the new finding consistent with subacute left pontine infarct, which fit to pt clinical picture. Likely small vessel disease from her uncontrolled risk factors. Her recent CUS 10/10/19 showed unremarkable. TTE 2/17 EF 50-55%. LDL 119 and A1C 8.2. she is on ASA and plavix and high dose statin. Only further recommendation from neuro standpoint will be pacemaker interrogation. If afib/aflutter found, may need to consider anticoagulation.   Rosalin Hawking, MD PhD Stroke Neurology 12/15/2019 4:35 PM
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
## 2019-12-15 NOTE — Progress Notes (Signed)
Inpatient Rehabilitation  Patient information reviewed and entered into eRehab system by Canesha Tesfaye M. Arshia Spellman, M.A., CCC/SLP, PPS Coordinator.  Information including medical coding, functional ability and quality indicators will be reviewed and updated through discharge.    
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
## 2019-12-15 NOTE — Progress Notes (Signed)
Physical Therapy Session Note  Patient Details  Name: [name] MRN: [id number] Date of Birth: 05-13-1928  Today's Date: 12/15/2019   Progress Updates:     Pt received supine in bed with PA present at bed side. Pt asleep, but aroused to tactile stimuli. Pt unresponsive to yes not questions for orientation.  Per PA, hold PT eval at this time for CT and Xray, due to change in neurologic status since last CT scan.  Pt left supine in bed with PA and RN present.      Lorie Phenix 12/15/2019, 1:49 PM

## 2019-12-16 ENCOUNTER — Inpatient Hospital Stay (HOSPITAL_COMMUNITY): Payer: Medicare Other | Admitting: Physical Therapy

## 2019-12-16 ENCOUNTER — Inpatient Hospital Stay (HOSPITAL_COMMUNITY): Payer: Medicare Other | Admitting: Speech Pathology

## 2019-12-16 ENCOUNTER — Inpatient Hospital Stay (HOSPITAL_COMMUNITY): Payer: Medicare Other | Admitting: Occupational Therapy

## 2019-12-16 LAB — GLUCOSE, CAPILLARY
Glucose-Capillary: 193 mg/dL — ABNORMAL HIGH (ref 70–99)
Glucose-Capillary: 207 mg/dL — ABNORMAL HIGH (ref 70–99)
Glucose-Capillary: 161 mg/dL — ABNORMAL HIGH (ref 70–99)
Glucose-Capillary: 221 mg/dL — ABNORMAL HIGH (ref 70–99)

## 2019-12-16 NOTE — Progress Notes (Signed)
Occupational Therapy Session Note  Patient Details  Name: [name] MRN: [id number] Date of Birth: 1927/11/21  Today's Date: 12/16/2019 OT Individual Time: [id number] OT Individual Time Calculation (min): 55 min   Short Term Goals: Week 1:  OT Short Term Goal 1 (Week 1): Pt will maintain dynamic balance during self care tasks at mod A or less for 5 minutes. OT Short Term Goal 2 (Week 1): Pt will perform UB dressing with mod A. OT Short Term Goal 3 (Week 1): Pt will perform functional transfer with assist of 1 person safely.  Skilled Therapeutic Interventions/Progress Updates:    Pt greeted in bed with no c/o pain, strong Rt gaze noted. Her breakfast tray was present in the room and pt was agreeable to start eating. Placed bed in chair position to work on trunk control during session. Pt with strong Lt lean/LOB and needing Max A from therapist to correct initially. Then lateral prop was placed to promote upright trunk due to pts inability to recognize and self correct. When cued to eat her breakfast, pt took ~40 seconds to reach for fork, scoop egg, and bring it to mouth (plate placed Lt of midline to promote visual scanning/head turning). Noted increased difficultly with visually scanning for her utensil and coordinating bringing utensil to mouth once food was speared. Pt with frequent bouts of coughing when given thin liquids by straw during meal, also when taking medicine from RN. She did much better when given nectar-thickened beverage (without straw) after each bite to help her swallow and clear food from Lt cheek. It took her ~1 minute to swallow each bite, neck laterally flexed to the Lt side when chewing. Mod facilitation for bringing food to mouth due to proprioceptive, coordination, and attention deficits. At end of session pt left upright in bed with Lt lateral props. Left her with call bell in lap and bed alarm set. Tx focus placed on ADL retraining, praxis, functional cognition, and  UE coordination.     Therapy Documentation Precautions:  Precautions Precautions: Fall Restrictions Weight Bearing Restrictions: NoVital Signs: Therapy Vitals Temp: 97.7 F (36.5 C) Pulse Rate: 70 Resp: 16 BP: (!) 156/57 Patient Position (if appropriate): Lying Oxygen Therapy SpO2: 96 % O2 Device: Room Air Pain: Pt denied pain during tx   ADL:       Therapy/Group: Individual Therapy  Axl Rodino A Makeya Hilgert 12/16/2019, 4:15 PM

## 2019-12-16 NOTE — IPOC Note (Addendum)
Overall Plan of Care [facility]) Patient Details Name: [name] MRN: [id number] DOB: 05/01/1928  Admitting Diagnosis: Small vessel cerebrovascular accident (CVA) [facility])  Hospital Problems: Principal Problem:   Small vessel cerebrovascular accident (CVA) ([place])     Functional Problem List: Nursing Behavior, Perception, Bladder, Bowel, Safety, Sensory, Skin Integrity, Endurance, Medication Management, Motor, Nutrition, Pain  PT Balance, Behavior, Endurance, Motor, Perception, Safety, Sensory, Skin Integrity  OT Balance, Cognition, Endurance, Motor, Perception, Safety, Sensory  SLP Cognition, Nutrition, Linguistic  TR         Basic ADL's: OT Grooming, Bathing, Dressing, Toileting     Advanced  ADL's: OT       Transfers: PT Bed Mobility, Bed to Chair, Car, Manufacturing systems engineer, Metallurgist: PT Ambulation, Emergency planning/management officer, Stairs     Additional Impairments: OT Fuctional Use of Upper Extremity  SLP Swallowing, Communication, Social Cognition expression Problem Solving, Memory, Awareness, Attention  TR      Anticipated Outcomes Item Anticipated Outcome  Self Feeding set up A  Swallowing  Supervision A   Basic self-care  Min a  Toileting  min A   Bathroom Transfers min A  Bowel/Bladder  Patient will manage bowel/bladder moderate assistance.  Transfers  mod assist transfers to and from [facility]  Locomotion  WC level with min assist  Communication  Superision A  Cognition  Min A  Pain  Patient will verbalize how to manage pain during hospitalization.  Safety/Judgment  Patient will verbalize ways to prevent falls/injury at home.   Therapy Plan: PT Intensity: Minimum of 1-2 x/day ,45 to 90 minutes PT Frequency: 5 out of 7 days PT Duration Estimated Length of Stay: 4-4.5 weeks OT Intensity: Minimum of 1-2 x/day, 45 to 90 minutes OT Frequency: 5 out of 7 days OT Duration/Estimated Length of Stay: 3-4 wks SLP Intensity: Minumum of 1-2 x/day, 30  to 90 minutes SLP Frequency: 3 to 5 out of 7 days SLP Duration/Estimated Length of Stay: 3 weeks   Due to the current state of emergency, patients may not be receiving their 3-hours of Medicare-mandated therapy.   Team Interventions: Nursing Interventions Patient/Family Education, Pain Management, Dysphagia/Aspiration Precaution Training, Bladder Management, Medication Management, Discharge Planning, Bowel Management, Skin Care/Wound Management, Psychosocial Support, Disease Management/Prevention, Cognitive Remediation/Compensation  PT interventions Ambulation/gait training, Discharge planning, Functional mobility training, Psychosocial support, Therapeutic Activities, Visual/perceptual remediation/compensation, Wheelchair propulsion/positioning, Therapeutic Exercise, Skin care/wound management, Disease management/prevention, Medical illustrator training, Neuromuscular re-education, Cognitive remediation/compensation, DME/adaptive equipment instruction, Pain management, UE/LE Strength taining/ROM, Splinting/orthotics, UE/LE Coordination activities, Stair training, Patient/family education, Functional electrical stimulation, Community reintegration  OT Interventions Training and development officer, Self Care/advanced ADL retraining, UE/LE Coordination activities, Functional electrical stimulation, Cognitive remediation/compensation, Functional mobility training, Neuromuscular re-education, Wheelchair propulsion/positioning, Discharge planning, Therapeutic Activities, Patient/family education, Therapeutic Exercise, Psychosocial support, UE/LE Strength taining/ROM, DME/adaptive equipment instruction  SLP Interventions Patient/family education, Cognitive remediation/compensation, Cueing hierarchy, Dysphagia/aspiration precaution training, Functional tasks, Environmental controls, Internal/external aids, Speech/Language facilitation  TR Interventions    SW/CM Interventions Discharge Planning, Disease  Management/Prevention, Psychosocial Support, Patient/Family Education   Barriers to Discharge MD  Medical stability  Nursing      PT Decreased caregiver support, Medical stability, Home environment access/layout    OT Decreased caregiver support, Other (comments) prior CVA affecting L UE and this CVA affecting R  SLP      SW Decreased caregiver support, Incontinence, Lack of/limited family support Daughter works during the day and hired help only there 2 hours/day   Team Discharge Planning: Destination:  PT-Skilled Nursing Facility (SNF) ,OT- Home , SLP-Home Projected Follow-up: PT-Home health PT, OT-  24 hour supervision/assistance, Home health OT, SLP-Home Health SLP, 24 hour supervision/assistance Projected Equipment Needs: PT-Wheelchair (measurements), Wheelchair cushion (measurements), To be determined, OT- To be determined, SLP-None recommended by SLP Equipment Details: PT- , OT-  Patient/family involved in discharge planning: PT- Patient, Family member/caregiver,  OT-Patient, SLP-Patient  MD ELOS: 21-25d Medical Rehab Prognosis:  Good Assessment:  86 year old right-handed female with history of CAD with stenting, CKD stage III with creatinine 1.56, CVA maintained on Plavix, memory loss maintained on Aricept followed by Dr. Jannifer Franklin, heart block with pacemaker, hypertension, type 2 diabetes mellitus, OSA with CPAP, chronic normocytic anemia, chronic diastolic congestive heart failure.  History taken from chart review due to cognition.  Patient lives with family.  1 level home.  Used a cane prior to admission for mobility and daughter assist with some basic ADLs.  She presented on 12/07/2019 after fall without LOC.  She was noted to have slurred speech and left hemiparesis.  Cranial CT scan unremarkable for acute intracranial process.  Patient did not receive TPA.  MRI not completed due to pacemaker.  Echocardiogram with ejection fraction of 55% without emboli.  Neurology follow-up currently  maintained on aspirin and Plavix for CVA prophylaxis subcutaneous Lovenox for DVT prophylaxis.  Admission chemistries with creatinine 1.62, SARS coronavirus negative.  Hospital course further complicated by postop dysphagia, on a dysphagia 2 thin liquid diet.  Urinalysis study 12/12/2019 greater than 100,000 Citrobacter placed on Merrem 12/13/2019 for UTI.  Therapy evaluations completed and patient was admitted for a comprehensive rehab program.  Please see preadmission assessment from earlier today as well.   Now requiring 24/7 Rehab RN,MD, as well as CIR level PT, OT and SLP.  Treatment team will focus on ADLs and mobility with goals set at [facility] See Team Conference Notes for weekly updates to the plan of care
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
## 2019-12-16 NOTE — Progress Notes (Signed)
[facility] PHYSICAL MEDICINE & REHABILITATION PROGRESS NOTE   Subjective/Complaints:  Appreciate neuro note , reviewed CT  Oriented to person place , day and month   ROS- denies CP, SOB,  N/V/D  Objective:   DG Chest 2 View  Result Date: 12/15/2019 CLINICAL DATA:  Shortness of breath. EXAM: CHEST - 2 VIEW COMPARISON:  Chest x-ray 12/07/2019. FINDINGS: AICD in stable position. Prior CABG. Cardiomegaly. Low lung volumes. Very mild bilateral interstitial prominence noted. Mild interstitial edema and/or pneumonitis cannot be excluded. No focal infiltrate. No pleural effusion or pneumothorax. Degenerative changes thoracic spine and both shoulders. IMPRESSION: 1.  AICD in stable position.  Prior CABG. 2. Low lung volumes. Very mild bilateral interstitial prominence noted. Mild interstitial edema and/or pneumonitis cannot be excluded. Electronically Signed   By: Marcello Moores  Register   On: 12/15/2019 14:01   CT HEAD WO CONTRAST  Addendum Date: 12/15/2019   ADDENDUM REPORT: 12/15/2019 16:10 ADDENDUM: These results were called by telephone at the time of interpretation on 12/15/2019 at 4:10 pm to provider Lauraine Rinne , who verbally acknowledged these results. Electronically Signed   By: Constance Holster M.D.   On: 12/15/2019 16:10   Result Date: 12/15/2019 CLINICAL DATA:  Stroke follow-up. EXAM: CT HEAD WITHOUT CONTRAST TECHNIQUE: Contiguous axial images were obtained from the base of the skull through the vertex without intravenous contrast. COMPARISON:  12/08/2019. FINDINGS: Brain: No evidence of acute infarction, hemorrhage, hydrocephalus, extra-axial collection or mass lesion/mass effect. Atrophy and chronic microvascular ischemic changes are noted. There is a new hypoattenuating area in the left pons. Vascular: No hyperdense vessel or unexpected calcification. Skull: Normal. Negative for fracture or focal lesion. Sinuses/Orbits: No acute finding. Other: None. IMPRESSION: 1. New hypoattenuating area  within the left pons is concerning for a developing infarct. Artifact in this region can have a very similar appearance. Further evaluation with MRI is recommended. 2. No acute intracranial hemorrhage. No mass effect or midline shift. Chronic findings as detailed above. Electronically Signed: By: Constance Holster M.D. On: 12/15/2019 15:59   Recent Labs    12/14/19 0552 12/15/19 0643  WBC 10.7* 10.2  HGB 10.1* 10.0*  HCT 32.3* 32.4*  PLT 212 231   Recent Labs    12/14/19 0552 12/15/19 0643  NA 142 141  K 4.5 4.4  CL 105 105  CO2 29 28  GLUCOSE 232* 283*  BUN 27* 30*  CREATININE 1.37* 1.26*  CALCIUM 12.2* 12.2*    Intake/Output Summary (Last 24 hours) at 12/16/2019 0902 Last data filed at 12/16/2019 0800 Gross per 24 hour  Intake 220 ml  Output 1360 ml  Net -1140 ml     Physical Exam: Vital Signs Blood pressure (!) 183/80, pulse 71, temperature 98.3 F (36.8 C), temperature source Oral, resp. rate 18, height 5' 7"  (1.702 m), weight 78.7 kg, SpO2 97 %.   General: No acute distress Mood and affect are appropriate Heart: Regular rate and rhythm no rubs murmurs or extra sounds Lungs: Clear to auscultation, breathing unlabored, no rales or wheezes Abdomen: Positive bowel sounds, soft nontender to palpation, nondistended Extremities: No clubbing, cyanosis, or edema Skin: No evidence of breakdown, no evidence of rash Neurologic: Cranial nerves II through XII intact, motor strength is 5/5 in Left, 0/5 RIght  deltoid, bicep, tricep, grip, hip flexor, knee extensors, ankle dorsiflexor and plantar flexor Sensory exam normal sensation to light touch  in bilateral upper and lower extremities as well as facial  Right CN VII central weakness No evidence of  nystagmus Musculoskeletal: Full range of motion in all 4 extremities. No joint swelling   Assessment/Plan: 1. Functional deficits secondary to Left brainstem vs subcortical infarct which require 3+ hours per day of  interdisciplinary therapy in a comprehensive inpatient rehab setting.  Physiatrist is providing close team supervision and 24 hour management of active medical problems listed below.  Physiatrist and rehab team continue to assess barriers to discharge/monitor patient progress toward functional and medical goals  Care Tool:  Bathing  Bathing activity did not occur: Safety/medical concerns           Bathing assist       Upper Body Dressing/Undressing Upper body dressing   What is the patient wearing?: Bra, Pull over shirt    Upper body assist Assist Level: Dependent - Patient 0%    Lower Body Dressing/Undressing Lower body dressing      What is the patient wearing?: Hospital gown only, Incontinence brief     Lower body assist Assist for lower body dressing: Dependent - Patient 0%     Toileting Toileting Toileting Activity did not occur (Clothing management and hygiene only): N/A (no void or bm)  Toileting assist Assist for toileting: Dependent - Patient 0%     Transfers Chair/bed transfer  Transfers assist     Chair/bed transfer assist level: 2 Helpers     Locomotion Ambulation   Ambulation assist              Walk 10 feet activity   Assist           Walk 50 feet activity   Assist           Walk 150 feet activity   Assist           Walk 10 feet on uneven surface  activity   Assist           Wheelchair     Assist               Wheelchair 50 feet with 2 turns activity    Assist            Wheelchair 150 feet activity     Assist          Blood pressure (!) 183/80, pulse 71, temperature 98.3 F (36.8 C), temperature source Oral, resp. rate 18, height 5' 7"  (1.702 m), weight 78.7 kg, SpO2 97 %.    Medical Problem List and Plan: 1.  Left side hemiplegia, dysarthria, +/- aphasia secondary to left brain infarction.  MRI not completed due to pacemaker CT is consistent with clinical picture,  given brainstem location of infarct difficult to visualize until there is some further evolution ? If pt had a dip in BP at time of NS changes             -patient may shower             -ELOS/Goals: 25-30 days/min/mod a             PT, OT, SLP evals 2.  Antithrombotics: -DVT/anticoagulation: Subcutaneous Lovenox             -antiplatelet therapy: Aspirin 81 mg daily, Plavix 75 mg daily 3. Pain Management: Tylenol as needed 4. Mood with memory deficits.  Aricept 10 mg nightly             -antipsychotic agents: N/A 5. Neuropsych: This patient is not capable of making decisions on her own behalf. 6. Skin/Wound Care: Routine skin checks 7.  Fluids/Electrolytes/Nutrition: Routine in and outs.  CMP ordered for tomorrow a.m. 8.  Hypertension.  Norvasc 2.5 mg daily, Coreg 3.125 mg twice daily.               Monitor with increased mobility Vitals:   12/16/19 0534 12/16/19 0535  BP:  (!) 183/80  Pulse:  71  Resp:    Temp: 98.3 F (36.8 C)   SpO2:  97%  will allow permissive HTN for another week 9.  Diabetes mellitus with hyperglycemia.  Hemoglobin A1c 8.2.  Lantus insulin 10 units twice daily, NovoLog 2 units 3 times daily check blood sugars before meals and at bedtime              CBG (last 3)  Recent Labs    12/15/19 1641 12/15/19 2120 12/16/19 0558  GLUCAP 154* 223* 193*  CBGs elevated increase lantus dose 14U BID  10.  CAD with stenting as well as pacemaker.  Continue aspirin and Plavix. 11.  CKD stage III.  Creatinine baseline 1.56.               BMP ordered for tomorrow a.m. 12.  Chronic diastolic congestive heart failure.  Monitor for any signs of fluid overload             Daily weights 13.  OSA.  CPAP. 14.  Hyperlipidemia.  Lipitor 15.  Chronic normocytic anemia.  Continue iron supplement             CBC ordered for tomorrow a.m. 16.  Post stroke dysphagia: Dysphagia #2 thin liquids.  Follow-up speech therapy.  Advance as tolerated 17.  UTI/Citrobacter S to . Keflex finish  7d course   LOS: 2 days A FACE TO FACE EVALUATION WAS PERFORMED  Charlett Blake 12/16/2019, 9:02 AM

## 2019-12-16 NOTE — Progress Notes (Signed)
Speech Language Pathology Daily Session Note  Patient Details  Name: [name] MRN: [id number] Date of Birth: September 30, 1928  Today's Date: 12/16/2019 SLP Individual Time: 4967-5916 SLP Individual Time Calculation (min): 60 min  Short Term Goals: Week 1: SLP Short Term Goal 1 (Week 1): Pt will consume current diet with minimal overt s/sx aspiration and demonstrate efficient mastication and oral clearance with timely AP transit of boluses with Mod A cues. SLP Short Term Goal 2 (Week 1): Pt will sustain attention to tasks for 15 minutes with Mod A verbal/visual cues. SLP Short Term Goal 3 (Week 1): Pt will demonstrate ability to problem solve during basic functional tasks with Max A verbal/visual cues. SLP Short Term Goal 4 (Week 1): Pt will demonstrate awareness of errors during functional tasks with Max A multimodal cues. SLP Short Term Goal 5 (Week 1): Pt will increase speech intelligibility to 75% at the phrase level with Mod A verbal/visual cues for use of increased vocal intensity and overarticulation.  Skilled Therapeutic Interventions:  Skilled treatment session targeted dysphagia and cognition goals.  Of note, pt's recent CT scan and neruology note on 12/15/19 pt has "new finding consistent with subacute infarct left pontine infarct."   SLP facilitated session by providing skilled observation of pt consuming thin liquids via cup. Pt had immediate cough during consumption that might be related to delayed swallow initiation. Nursing also expressed concern d/t pt coughing on thin liquids. SLP provided trial of nectar thick liquid at bedside and s/s ceased. Recommend downgrading liquids to nectar thick liquids. At this time, recommend MBS to assess swallow function and determine safest diet.     SLP further facilitated session by providing Max A verbal cues to increase speech intelligibility to ~ 50% at the simple phrase level. Additionally, pt was able to sort between 2 colors with Max  A verbal cues and sustain attention to task for ~ 5 minute intervals. Fatigue likely impacted pt's ability.   Education provided to pt's nurse, posted in pt's room and MBS scheduled for 3/1 at 0900.    Pain    Therapy/Group: Individual Therapy  Latoya Maulding 12/16/2019, 11:51 AM

## 2019-12-16 NOTE — Progress Notes (Signed)
Social Work Patient ID: [name], female   DOB: 1927/12/14, 86 y.o.   MRN: [id number]    SW received phone call from [facility] [phone] ext. [phone]) verifying admission date (2/24), and stating pt next review date is 12/21/2019. SW informed will provide information to assigned CM.  Loralee Pacas, MSW, [place] Office: [phone] Cell: [phone] Fax: [phone]

## 2019-12-17 ENCOUNTER — Inpatient Hospital Stay (HOSPITAL_COMMUNITY): Payer: Medicare Other | Admitting: Occupational Therapy

## 2019-12-17 ENCOUNTER — Inpatient Hospital Stay (HOSPITAL_COMMUNITY): Payer: Medicare Other | Admitting: Speech Pathology

## 2019-12-17 ENCOUNTER — Inpatient Hospital Stay (HOSPITAL_COMMUNITY): Payer: Medicare Other | Admitting: Physical Therapy

## 2019-12-17 DIAGNOSIS — I10 Essential (primary) hypertension: Secondary | ICD-10-CM

## 2019-12-17 DIAGNOSIS — A499 Bacterial infection, unspecified: Secondary | ICD-10-CM

## 2019-12-17 DIAGNOSIS — N390 Urinary tract infection, site not specified: Secondary | ICD-10-CM

## 2019-12-17 DIAGNOSIS — R1312 Dysphagia, oropharyngeal phase: Secondary | ICD-10-CM

## 2019-12-17 LAB — GLUCOSE, CAPILLARY
Glucose-Capillary: 173 mg/dL — ABNORMAL HIGH (ref 70–99)
Glucose-Capillary: 163 mg/dL — ABNORMAL HIGH (ref 70–99)
Glucose-Capillary: 163 mg/dL — ABNORMAL HIGH (ref 70–99)
Glucose-Capillary: 101 mg/dL — ABNORMAL HIGH (ref 70–99)

## 2019-12-17 NOTE — Progress Notes (Signed)
[facility] PHYSICAL MEDICINE & REHABILITATION PROGRESS NOTE   Subjective/Complaints:  Pt up with PT/RN in w/c. Drooling, right side remains quite weak  ROS: Limited due to cognitive/behavioral    Objective:   DG Chest 2 View  Result Date: 12/15/2019 CLINICAL DATA:  Shortness of breath. EXAM: CHEST - 2 VIEW COMPARISON:  Chest x-ray 12/07/2019. FINDINGS: AICD in stable position. Prior CABG. Cardiomegaly. Low lung volumes. Very mild bilateral interstitial prominence noted. Mild interstitial edema and/or pneumonitis cannot be excluded. No focal infiltrate. No pleural effusion or pneumothorax. Degenerative changes thoracic spine and both shoulders. IMPRESSION: 1.  AICD in stable position.  Prior CABG. 2. Low lung volumes. Very mild bilateral interstitial prominence noted. Mild interstitial edema and/or pneumonitis cannot be excluded. Electronically Signed   By: Marcello Moores  Register   On: 12/15/2019 14:01   CT HEAD WO CONTRAST  Addendum Date: 12/15/2019   ADDENDUM REPORT: 12/15/2019 16:10 ADDENDUM: These results were called by telephone at the time of interpretation on 12/15/2019 at 4:10 pm to provider Lauraine Rinne , who verbally acknowledged these results. Electronically Signed   By: Constance Holster M.D.   On: 12/15/2019 16:10   Result Date: 12/15/2019 CLINICAL DATA:  Stroke follow-up. EXAM: CT HEAD WITHOUT CONTRAST TECHNIQUE: Contiguous axial images were obtained from the base of the skull through the vertex without intravenous contrast. COMPARISON:  12/08/2019. FINDINGS: Brain: No evidence of acute infarction, hemorrhage, hydrocephalus, extra-axial collection or mass lesion/mass effect. Atrophy and chronic microvascular ischemic changes are noted. There is a new hypoattenuating area in the left pons. Vascular: No hyperdense vessel or unexpected calcification. Skull: Normal. Negative for fracture or focal lesion. Sinuses/Orbits: No acute finding. Other: None. IMPRESSION: 1. New hypoattenuating area  within the left pons is concerning for a developing infarct. Artifact in this region can have a very similar appearance. Further evaluation with MRI is recommended. 2. No acute intracranial hemorrhage. No mass effect or midline shift. Chronic findings as detailed above. Electronically Signed: By: Constance Holster M.D. On: 12/15/2019 15:59   Recent Labs    12/15/19 0643  WBC 10.2  HGB 10.0*  HCT 32.4*  PLT 231   Recent Labs    12/15/19 0643  NA 141  K 4.4  CL 105  CO2 28  GLUCOSE 283*  BUN 30*  CREATININE 1.26*  CALCIUM 12.2*    Intake/Output Summary (Last 24 hours) at 12/17/2019 0858 Last data filed at 12/17/2019 0511 Gross per 24 hour  Intake 145 ml  Output 1350 ml  Net -1205 ml     Physical Exam: Vital Signs Blood pressure 139/67, pulse 72, temperature 98.2 F (36.8 C), resp. rate 19, height 5' 7"  (1.702 m), weight 78.7 kg, SpO2 100 %.   Constitutional: No distress . Vital signs reviewed. HEENT: EOMI, oral membranes moist Neck: supple Cardiovascular: RRR without murmur. No JVD    Respiratory/Chest: CTA Bilaterally without wheezes or rales. Normal effort    GI/Abdomen: BS +, non-tender, non-distended Ext: no edema Skin: No evidence of breakdown, no evidence of rash Neurologic  motor strength is 3-4/5 in Left, 0/5 RIght  deltoid, bicep, tricep, grip, hip flexor, knee extensors, ankle dorsiflexor and plantar flexor Sensory exam normal sensation to light touch  in bilateral upper and lower extremities as well as facial DTR's 2++ RUE RLE Right CN VII and poor tongue control.  No evidence of nystagmus Musculoskeletal: no joint pain   Assessment/Plan: 1. Functional deficits secondary to Left brainstem vs subcortical infarct which require 3+ hours per day  of interdisciplinary therapy in a comprehensive inpatient rehab setting.  Physiatrist is providing close team supervision and 24 hour management of active medical problems listed below.  Physiatrist and rehab team  continue to assess barriers to discharge/monitor patient progress toward functional and medical goals  Care Tool:  Bathing  Bathing activity did not occur: Safety/medical concerns           Bathing assist       Upper Body Dressing/Undressing Upper body dressing   What is the patient wearing?: Bra, Pull over shirt    Upper body assist Assist Level: Dependent - Patient 0%    Lower Body Dressing/Undressing Lower body dressing      What is the patient wearing?: Hospital gown only, Incontinence brief     Lower body assist Assist for lower body dressing: Dependent - Patient 0%     Toileting Toileting Toileting Activity did not occur (Clothing management and hygiene only): N/A (no void or bm)  Toileting assist Assist for toileting: Dependent - Patient 0%     Transfers Chair/bed transfer  Transfers assist  Chair/bed transfer activity did not occur: Safety/medical concerns  Chair/bed transfer assist level: 2 Helpers     Locomotion Ambulation   Ambulation assist   Ambulation activity did not occur: Safety/medical concerns          Walk 10 feet activity   Assist  Walk 10 feet activity did not occur: Safety/medical concerns        Walk 50 feet activity   Assist Walk 50 feet with 2 turns activity did not occur: Safety/medical concerns         Walk 150 feet activity   Assist Walk 150 feet activity did not occur: Safety/medical concerns         Walk 10 feet on uneven surface  activity   Assist Walk 10 feet on uneven surfaces activity did not occur: Safety/medical concerns         Wheelchair     Assist Will patient use wheelchair at discharge?: Yes Type of Wheelchair: Manual Wheelchair activity did not occur: Safety/medical concerns         Wheelchair 50 feet with 2 turns activity    Assist    Wheelchair 50 feet with 2 turns activity did not occur: Safety/medical concerns       Wheelchair 150 feet activity      Assist  Wheelchair 150 feet activity did not occur: Safety/medical concerns       Blood pressure 139/67, pulse 72, temperature 98.2 F (36.8 C), resp. rate 19, height 5' 7"  (1.702 m), weight 78.7 kg, SpO2 100 %.    Medical Problem List and Plan: 1.  Rightt side hemiplegia, dysarthria, +/- aphasia secondary to left brain infarction.  MRI not completed due to pacemaker  -CT demonstrates hypoattenuation in left pons which is consistent with current presentation             -patient may shower             -ELOS/Goals: 25-30 days/min/mod a             PT, OT, SLP  2.  Antithrombotics: -DVT/anticoagulation: Subcutaneous Lovenox             -antiplatelet therapy: Aspirin 81 mg daily, Plavix 75 mg daily 3. Pain Management: Tylenol as needed 4. Mood with memory deficits.  Aricept 10 mg nightly             -antipsychotic agents: N/A 5. Neuropsych: This  patient is not capable of making decisions on her own behalf. 6. Skin/Wound Care: Routine skin checks 7. Fluids/Electrolytes/Nutrition: Routine in and outs.  CMP ordered for tomorrow a.m. 8.  Hypertension.  Norvasc 2.5 mg daily, Coreg 3.125 mg twice daily.               Monitor with increased mobility Vitals:   12/16/19 2047 12/17/19 0456  BP: (!) 160/64 139/67  Pulse: 74 72  Resp: 18 19  Temp: 99.1 F (37.3 C) 98.2 F (36.8 C)  SpO2: 97% 100%   -permissive HTN for another week given small vessel disease 9.  Diabetes mellitus with hyperglycemia.  Hemoglobin A1c 8.2.  Lantus insulin 10 units twice daily, NovoLog 2 units 3 times daily check blood sugars before meals and at bedtime              CBG (last 3)  Recent Labs    12/16/19 1629 12/16/19 2126 12/17/19 0606  GLUCAP 161* 221* 173*   CBGs elevated increase lantus dose 14U BID   2/27 observe for pattern today 10.  CAD with stenting as well as pacemaker.  Continue aspirin and Plavix. 11.  CKD stage III.  Creatinine baseline 1.56.               Cr 1.26 on 2/25 12.   Chronic diastolic congestive heart failure.  Monitor for any signs of fluid overload             Daily weights 13.  OSA.  CPAP. 14.  Hyperlipidemia.  Lipitor 15.  Chronic normocytic anemia.  Continue iron supplement             hgb 10 16.  Post stroke dysphagia: Dysphagia #2 thin liquids.  Follow-up speech therapy.  Advance as tolerated  2/27 diet downgraded to D1/nectar given pontine infarct/presentation   -aspiration precautions, close observation   -low threshold to make npo, re-check cxr 17.  UTI/Citrobacter/EColi: S to . Keflex finish 7d course   -low grade temp yesterday LOS: 3 days A FACE TO FACE EVALUATION WAS PERFORMED  Meredith Staggers 12/17/2019, 8:58 AM

## 2019-12-17 NOTE — Progress Notes (Signed)
Speech Language Pathology Daily Session Note  Patient Details  Name: ARACELY RICKETT MRN: [id number] Date of Birth: 02/24/1928  Today's Date: 12/17/2019 SLP Individual Time: 1435-1500 SLP Individual Time Calculation (min): 25 min  Short Term Goals: Week 1: SLP Short Term Goal 1 (Week 1): Pt will consume current diet with minimal overt s/sx aspiration and demonstrate efficient mastication and oral clearance with timely AP transit of boluses with Mod A cues. SLP Short Term Goal 2 (Week 1): Pt will sustain attention to tasks for 15 minutes with Mod A verbal/visual cues. SLP Short Term Goal 3 (Week 1): Pt will demonstrate ability to problem solve during basic functional tasks with Max A verbal/visual cues. SLP Short Term Goal 4 (Week 1): Pt will demonstrate awareness of errors during functional tasks with Max A multimodal cues. SLP Short Term Goal 5 (Week 1): Pt will increase speech intelligibility to 75% at the phrase level with Mod A verbal/visual cues for use of increased vocal intensity and overarticulation.  Skilled Therapeutic Interventions:  Pt was seen for skilled ST targeting goals for speech and swallowing.  Pt was asleep upon arrival but awakened to therapist's voice.  Despite awakening easily, pt remained lethargic throughout therapy session and would drift off to sleep quickly.   Pt's voice was very wet which, in combination with marked oral motor weakness, made her speech quite unintelligible.  With encouragement for coughing followed by suctioning, pt was able to significantly clear her vocal quality.  Spoke with RN who reports that despite frequent oral care pt is consistently having difficulty managing her secretions (last oral care was provided ~20 min prior to therapist's arrival).  Pt consumed 4 sips of nectar thick liquids.  Her initial sip elicited immediate coughing but subsequent sips were tolerated without overt s/s of aspiration.  I suspect that currently pt is at a very high  risk of aspiration, even on her own secretions.  Recommend that pt remain on dys 1, nectar thick liquids with full supervision for use of swallowing precautions and MBS on Monday 3/1.  Staff should cease POs if pt becomes lethargic. As session progressed, it became increasingly difficult to maintain pt's alertness.  Pt requested to rest and session was ended early.   Pt was left in bed with bed alarm set and call bell within reach.  Continue per current plan of care.     Pain Pain Assessment Pain Scale: 0-10 Pain Score: 0-No pain  Therapy/Group: Individual Therapy  Clarene Curran, Selinda Orion 12/17/2019, 3:10 PM

## 2019-12-17 NOTE — Progress Notes (Signed)
Occupational Therapy Session Note  Patient Details  Name: [name] MRN: [id number] Date of Birth: 04/29/1928  Today's Date: 12/17/2019 OT Individual Time: 1100-1157 OT Individual Time Calculation (min): 57 min   Short Term Goals: Week 1:  OT Short Term Goal 1 (Week 1): Pt will maintain dynamic balance during self care tasks at mod A or less for 5 minutes. OT Short Term Goal 2 (Week 1): Pt will perform UB dressing with mod A. OT Short Term Goal 3 (Week 1): Pt will perform functional transfer with assist of 1 person safely.  Skilled Therapeutic Interventions/Progress Updates:    Pt greeted in TIS with RN present to administer medication. Pt with no s/s pain, just appearing very lethargic. Discussed with RN placing pt on night baths for future. During session pt completed UB self care while w/c level at the sink. Worked on following 1 step instruction, initiation, and praxis at this time. Pt able to wash her face when presented with wash cloth, needed 2 vcs to initiate with delayed response noted. HOH to wash other body areas with pt only able to continue washing for brief windows of time once [facility] was stopped. Pt with increased lethargy as tx progressed, vcs for keeping eyes open and tactile cues for increasing overall alertness. Pt did elevate Lt UE/LE with instruction during functional tasks. Max vcs and assist for including the Rt side during self care. She was able tolerate figure 4 bilaterally while OT washed lower legs, pt assisting with upper legs. Max Ax2 for sit<stand in [place]. Once she returned to bed, perihygiene and brief change were completed with pt rolling Rt>Lt with Max A and facilitation for Lt hand placement on bedrail. HOH to incorporate Rt during handwashing with sanitizer and applying lotion to upper arms after. Pt remained in bed with all needs within reach and bed alarm set. She fell asleep before OT left the room.   Therapy Documentation Precautions:   Precautions Precautions: Fall Restrictions Weight Bearing Restrictions: No Vital Signs: Therapy Vitals Temp: 98.7 F (37.1 C) Pulse Rate: 70 Resp: 16 BP: (!) 165/74 Patient Position (if appropriate): Lying Oxygen Therapy SpO2: 92 % O2 Device: Room Air Pain: Pain Assessment Pain Scale: 0-10 Pain Score: 0-No pain ADL:  :     Therapy/Group: Individual Therapy  Halimah Bewick A Dayonna Selbe 12/17/2019, 4:36 PM

## 2019-12-17 NOTE — Progress Notes (Signed)
Physical Therapy Session Note  Patient Details  Name: [name] MRN: [id number] Date of Birth: 29-Dec-1927  Today's Date: 12/17/2019 PT Individual Time: 0800-0900 PT Individual Time Calculation (min): 60 min   Short Term Goals: Week 1:  PT Short Term Goal 1 (Week 1): pt will remain out of bed 1 hour between therapies. PT Short Term Goal 2 (Week 1): Pt will perform bed<>WC transfer with max assist of 1 person PT Short Term Goal 3 (Week 1): Pt will attend to R side 25 % the time with moderate cues in functional tasks. PT Short Term Goal 4 (Week 1): Pt will initiate WC mobility  Skilled Therapeutic Interventions/Progress Updates:    Pt received supine in bed and agreeable to PT. Supine>sit transfer with total A to come to EOB. sitting balance EOB x 10 minutes with min-mod assist overall from PT. Moderate cues for improved uprigth trunk posture, as well as awareness to occasional R lateral LOB. Pt able to correct with minA once aware of lean. Stedy transfer to [facility] with max assist + 2. Pt able to sustain partil stand in stedy with min assist from PT to prevent R lateral LOB with LUE supported on bar.   PT assisted RN with medication administration with moderate cues for initiation of bite and swallow and use of nectar thick liquids to improve initiation of swallow. For each bite, pt took ~45 sec to 1 min from initiation to bite to swallow pills in apple sauce. As well as 20-45 seconds to initiate swallow once thickened water in mouth.   Pt transported in [place] [facility] to rehab gym. BP taken sitting in WC 165/67. Sit<>stand x 3 with max assist to block Rknee and facilitate hip and trunk extension to achieve full standing upright. Pt able to sustain standing position 5 sec, 10 sec and 15 sec respectively. BP taken after eachbout 179/80, 185/86, and 189/87 respectively. Increased oral secretions once in standing noted by PT.   Patient returned to room and left sitting in TIS [facility] with call bell in reach  and all needs met.          Therapy Documentation Precautions:  Precautions Precautions: Fall Restrictions Weight Bearing Restrictions: No Pain: Pain Assessment Pain Scale: 0-10 Pain Score: 0-No pain    Therapy/Group: Individual Therapy  Lorie Phenix 12/17/2019, 11:55 AM

## 2019-12-18 ENCOUNTER — Inpatient Hospital Stay (HOSPITAL_COMMUNITY): Payer: Medicare Other | Admitting: Occupational Therapy

## 2019-12-18 DIAGNOSIS — E669 Obesity, unspecified: Secondary | ICD-10-CM

## 2019-12-18 DIAGNOSIS — E1169 Type 2 diabetes mellitus with other specified complication: Secondary | ICD-10-CM

## 2019-12-18 LAB — COMPREHENSIVE METABOLIC PANEL
ALT: 27 U/L (ref 0–44)
AST: 27 U/L (ref 15–41)
Albumin: 3.2 g/dL — ABNORMAL LOW (ref 3.5–5.0)
Alkaline Phosphatase: 69 U/L (ref 38–126)
Anion gap: 6 (ref 5–15)
BUN: 31 mg/dL — ABNORMAL HIGH (ref 8–23)
CO2: 31 mmol/L (ref 22–32)
Calcium: 13.7 mg/dL (ref 8.9–10.3)
Chloride: 114 mmol/L — ABNORMAL HIGH (ref 98–111)
Creatinine, Ser: 1.52 mg/dL — ABNORMAL HIGH (ref 0.44–1.00)
GFR calc Af Amer: 35 mL/min — ABNORMAL LOW (ref >60)
GFR calc non Af Amer: 30 mL/min — ABNORMAL LOW (ref >60)
Glucose, Bld: 207 mg/dL — ABNORMAL HIGH (ref 70–99)
Potassium: 4 mmol/L (ref 3.5–5.1)
Sodium: 151 mmol/L — ABNORMAL HIGH (ref 135–145)
Total Bilirubin: 0.6 mg/dL (ref 0.3–1.2)
Total Protein: 6.9 g/dL (ref 6.5–8.1)

## 2019-12-18 LAB — CBC
HCT: 40.3 % (ref 36.0–46.0)
Hemoglobin: 12.3 g/dL (ref 12.0–15.0)
MCH: 29.1 pg (ref 26.0–34.0)
MCHC: 30.5 g/dL (ref 30.0–36.0)
MCV: 95.5 fL (ref 80.0–100.0)
Platelets: 279 10*3/uL (ref 150–400)
RBC: 4.22 MIL/uL (ref 3.87–5.11)
RDW: 12.7 % (ref 11.5–15.5)
WBC: 11.3 10*3/uL — ABNORMAL HIGH (ref 4.0–10.5)
nRBC: 0 % (ref 0.0–0.2)

## 2019-12-18 LAB — GLUCOSE, CAPILLARY
Glucose-Capillary: 130 mg/dL — ABNORMAL HIGH (ref 70–99)
Glucose-Capillary: 163 mg/dL — ABNORMAL HIGH (ref 70–99)
Glucose-Capillary: 139 mg/dL — ABNORMAL HIGH (ref 70–99)
Glucose-Capillary: 119 mg/dL — ABNORMAL HIGH (ref 70–99)

## 2019-12-18 MED ORDER — INSULIN GLARGINE 100 UNIT/ML ~~LOC~~ SOLN
6.0000 [IU] | Freq: Two times a day (BID) | SUBCUTANEOUS | Status: DC
  Administered 2019-12-18 – 2019-12-22 (×9): 6 [IU] via SUBCUTANEOUS
  Filled 2019-12-18 (×11): qty 0.06

## 2019-12-18 MED ORDER — SODIUM CHLORIDE 0.9% FLUSH
10.0000 mL | Freq: Two times a day (BID) | INTRAVENOUS | Status: DC
  Administered 2019-12-18 – 2019-12-22 (×6): 10 mL
  Administered 2019-12-23: 40 mL
  Administered 2019-12-24 – 2020-01-09 (×32): 10 mL

## 2019-12-18 MED ORDER — SODIUM CHLORIDE 0.9% FLUSH
10.0000 mL | INTRAVENOUS | Status: DC | PRN
  Administered 2019-12-20 – 2020-01-04 (×2): 10 mL

## 2019-12-18 MED ORDER — SODIUM CHLORIDE 0.45 % IV SOLN: INTRAVENOUS | Status: DC

## 2019-12-18 MED ORDER — SODIUM CHLORIDE 0.9 % IV SOLN
1.0000 g | INTRAVENOUS | Status: AC
  Administered 2019-12-18 – 2019-12-19 (×2): 1 g via INTRAVENOUS
  Filled 2019-12-18 (×2): qty 1

## 2019-12-18 NOTE — Progress Notes (Signed)
Occupational Therapy Session Note  Patient Details  Name: [name] MRN: [id number] Date of Birth: 1928/04/29  Today's Date: 12/18/2019 OT Individual Time: 1300-1343 OT Individual Time Calculation (min): 43 min   Short Term Goals: Week 1:  OT Short Term Goal 1 (Week 1): Pt will maintain dynamic balance during self care tasks at mod A or less for 5 minutes. OT Short Term Goal 2 (Week 1): Pt will perform UB dressing with mod A. OT Short Term Goal 3 (Week 1): Pt will perform functional transfer with assist of 1 person safely.  Skilled Therapeutic Interventions/Progress Updates:    Pt greeted in bed, much more alert that she's been in previous sessions with this therapist. Denying pain, agreeable to attempt [facility] transfer. +2 for supine<sit with pt visibly trying to assist given vcs. While EOB, +2 for sit<stand in [place]. Pt once again visibly trying to assist, placing Lt hand on Stedy bar with cuing and pulling herself forward. Worked on sitting balance and ADL retraining while pt sat on the [facility]. She was able to improve posture for short windows of time given manual and verbal cuing. Noted Rt lean or anterior lean. Heavy +2 for sit<stand from [facility]. Max A of 1 for sit<stand from paddles while 2nd person completed perihygiene, brief change, and elevated pants. Pt at this time appearing more fatigued and needed Max A to keep trunk upright vs bent over Stedy bar. Stedy used for transfer>TIS. Pt remained in TIS with safety belt fastened, reclined for safety and pressure relief. Left her with call bell in lap.  Pt unable to have continent void using BSC during session.   Therapy Documentation Precautions:  Precautions Precautions: Fall Restrictions Weight Bearing Restrictions: No Vital Signs: Therapy Vitals Temp: 98.5 F (36.9 C) Temp Source: Oral Pulse Rate: 69 Resp: 16 BP: (!) 187/66 Oxygen Therapy SpO2: 98 % O2 Device: Room Air ADL:      Therapy/Group: Individual  Therapy  Rosebud Koenen A Kemba Hoppes 12/18/2019, 4:29 PM

## 2019-12-18 NOTE — Progress Notes (Addendum)
Patient placed on PPV

## 2019-12-18 NOTE — Progress Notes (Signed)
Patient placed on CPAP

## 2019-12-18 NOTE — Progress Notes (Signed)
Pt with increased lethargy and decreased control of oral secretions and food compared to yesterday. Pt is able to open eyes when called by name, however unable to maintain alertness for more that 30 secs. Pt would not swallow dysphagia 1 diet and nectar thick fluids this morning. Held all PO meds. Fluids and IV antibiotic started per order.   Lab notified RN of critical value calcium. Dr Naaman Plummer is aware of lab value and pt's current status. Pt currently NPO with IV fluids. Will continue to monitor.   Gerald Stabs, RN

## 2019-12-18 NOTE — Progress Notes (Addendum)
[facility] PHYSICAL MEDICINE & REHABILITATION PROGRESS NOTE   Subjective/Complaints: Pt in bed, sitting up. Gurgling up secretions. Responds to her name, doesn't appear to be in distress  ROS: Limited due to cognitive/behavioral   Objective:   No results found. No results for input(s): WBC, HGB, HCT, PLT in the last 72 hours. No results for input(s): NA, K, CL, CO2, GLUCOSE, BUN, CREATININE, CALCIUM in the last 72 hours.  Intake/Output Summary (Last 24 hours) at 12/18/2019 4580 Last data filed at 12/18/2019 0230 Gross per 24 hour  Intake 60 ml  Output 1100 ml  Net -1040 ml     Physical Exam: Vital Signs Blood pressure (!) 162/73, pulse 78, temperature 98.9 F (37.2 C), resp. rate 18, height 5' 7"  (1.702 m), weight 78.7 kg, SpO2 100 %.   Constitutional: No distress . Vital signs reviewed. HEENT: EOMI, oral membranes moist Neck: supple Cardiovascular: RRR without murmur. No JVD    Respiratory/Chest: decreased effort, upper airway sounds, no wheezing or rales    GI/Abdomen: BS +, non-tender, non-distended Ext: no clubbing, cyanosis, or edema Skin: No evidence of breakdown, no evidence of rash. Large egg-sized lipoma right shoulder.  Neurologic: fairly alert. Follows basic commands.   motor strength is 3-4/5 in Left, 0/5 RIght  deltoid, bicep, tricep, grip, hip flexor, knee extensors, ankle dorsiflexor and plantar flexor--no changes Sensory exam normal sensation to light touch  in bilateral upper and lower extremities as well as facial DTR's 2++ RUE RLE Right CN VII and poor oral-motor control, decreased gag/cough. Drooling, secretions pooling in mouth No evidence of nystagmus Musculoskeletal: no joint pain   Assessment/Plan: 1. Functional deficits secondary to Left brainstem vs subcortical infarct which require 3+ hours per day of interdisciplinary therapy in a comprehensive inpatient rehab setting.  Physiatrist is providing close team supervision and 24 hour management of  active medical problems listed below.  Physiatrist and rehab team continue to assess barriers to discharge/monitor patient progress toward functional and medical goals  Care Tool:  Bathing  Bathing activity did not occur: Safety/medical concerns Body parts bathed by patient: Face   Body parts bathed by helper: Right arm, Left arm, Chest, Abdomen, Front perineal area, Buttocks, Right upper leg, Left upper leg, Right lower leg, Left lower leg     Bathing assist Assist Level: 2 Helpers     Upper Body Dressing/Undressing Upper body dressing   What is the patient wearing?: Bra, Pull over shirt    Upper body assist Assist Level: 2 Helpers    Lower Body Dressing/Undressing Lower body dressing      What is the patient wearing?: Incontinence brief     Lower body assist Assist for lower body dressing: 2 Helpers     Toileting Toileting Toileting Activity did not occur (Clothing management and hygiene only): N/A (no void or bm)  Toileting assist Assist for toileting: Dependent - Patient 0%     Transfers Chair/bed transfer  Transfers assist  Chair/bed transfer activity did not occur: Safety/medical concerns  Chair/bed transfer assist level: Dependent - mechanical lift     Locomotion Ambulation   Ambulation assist   Ambulation activity did not occur: Safety/medical concerns          Walk 10 feet activity   Assist  Walk 10 feet activity did not occur: Safety/medical concerns        Walk 50 feet activity   Assist Walk 50 feet with 2 turns activity did not occur: Safety/medical concerns  Walk 150 feet activity   Assist Walk 150 feet activity did not occur: Safety/medical concerns         Walk 10 feet on uneven surface  activity   Assist Walk 10 feet on uneven surfaces activity did not occur: Safety/medical concerns         Wheelchair     Assist Will patient use wheelchair at discharge?: Yes Type of Wheelchair:  Manual Wheelchair activity did not occur: Safety/medical concerns         Wheelchair 50 feet with 2 turns activity    Assist    Wheelchair 50 feet with 2 turns activity did not occur: Safety/medical concerns       Wheelchair 150 feet activity     Assist  Wheelchair 150 feet activity did not occur: Safety/medical concerns       Blood pressure (!) 162/73, pulse 78, temperature 98.9 F (37.2 C), resp. rate 18, height 5' 7"  (1.702 m), weight 78.7 kg, SpO2 100 %.    Medical Problem List and Plan: 1.  Rightt side hemiplegia, dysarthria, +/- aphasia secondary to left brain infarction.  MRI not completed due to pacemaker  -CT demonstrates hypoattenuation in left pons which is consistent with current presentation---increased right hp and increased difficulty with oro-pharyngeal control             -patient may shower             -ELOS/Goals: 25-30 days/min/mod a             PT, OT, SLP  2.  Antithrombotics: -DVT/anticoagulation: Subcutaneous Lovenox             -antiplatelet therapy: Aspirin 81 mg daily, Plavix 75 mg daily 3. Pain Management: Tylenol as needed 4. Mood with memory deficits.  Aricept 10 mg nightly             -antipsychotic agents: N/A 5. Neuropsych: This patient is not capable of making decisions on her own behalf. 6. Skin/Wound Care: Routine skin checks 7. Fluids/Electrolytes/Nutrition: Routine in and outs.  CMP ordered for tomorrow a.m. 8.  Hypertension.  Norvasc 2.5 mg daily, Coreg 3.125 mg twice daily.               Monitor with increased mobility Vitals:   12/18/19 0431 12/18/19 0900  BP: (!) 150/74 (!) 162/73  Pulse: 79 78  Resp: 19 18  Temp: (!) 97.5 F (36.4 C) 98.9 F (37.2 C)  SpO2: 95% 100%   -2/28 permissive HTN for this week given small vessel disease 9.  Diabetes mellitus with hyperglycemia.  Hemoglobin A1c 8.2.  Lantus insulin 10 units twice daily, NovoLog 2 units 3 times daily check blood sugars before meals and at bedtime               CBG (last 3)  Recent Labs    12/17/19 1207 12/17/19 1655 12/17/19 2132  GLUCAP 163* 163* 101*   CBGs elevated increase lantus dose 14U BID   2/28 improving control, not eating much though  -reduce lantus while npo tp 6u bid,hold scheduled novolog   10.  CAD with stenting as well as pacemaker.  Continue aspirin and Plavix. 11.  CKD stage III.  Creatinine baseline 1.56.               Cr 1.26 on 2/25 12.  Chronic diastolic congestive heart failure.  Monitor for any signs of fluid overload             Daily  weights 13.  OSA.  CPAP. 14.  Hyperlipidemia.  Lipitor 15.  Chronic normocytic anemia.  Continue iron supplement             hgb 10 16.  Post stroke dysphagia: Dysphagia #2 thin liquids.  Follow-up speech therapy.  Advance as tolerated  2/27 diet downgraded to D1/nectar given pontine infarct/presentation   -aspiration precautions, close observation   -low threshold to make npo, re-check cxr  2/28 still struggling with mgt of secretions d/t oro-pharyngeal weakness.   -make npo   -start IVF for now   -MBS tomorrow 17.  UTI/Citrobacter/EColi: S to . Keflex finish 7d course   -low grade temp yesterday  -change keflex to rocephin1g daily for 2 days  Greater than 35 total minutes was spent in examination of patient, assessment of pertinent data,  formulation of a treatment plan, and in discussion with patient and/or family.      LOS: 4 days A FACE TO FACE EVALUATION WAS PERFORMED  Meredith Staggers 12/18/2019, 9:22 AM

## 2019-12-19 ENCOUNTER — Inpatient Hospital Stay (HOSPITAL_COMMUNITY): Payer: Medicare Other

## 2019-12-19 ENCOUNTER — Inpatient Hospital Stay (HOSPITAL_COMMUNITY): Payer: Medicare Other | Admitting: Occupational Therapy

## 2019-12-19 ENCOUNTER — Encounter (HOSPITAL_COMMUNITY): Payer: Medicare Other | Admitting: Speech Pathology

## 2019-12-19 ENCOUNTER — Inpatient Hospital Stay (HOSPITAL_COMMUNITY): Payer: Medicare Other | Admitting: Speech Pathology

## 2019-12-19 DIAGNOSIS — D72829 Elevated white blood cell count, unspecified: Secondary | ICD-10-CM

## 2019-12-19 DIAGNOSIS — R1310 Dysphagia, unspecified: Secondary | ICD-10-CM

## 2019-12-19 DIAGNOSIS — D638 Anemia in other chronic diseases classified elsewhere: Secondary | ICD-10-CM

## 2019-12-19 DIAGNOSIS — G8191 Hemiplegia, unspecified affecting right dominant side: Secondary | ICD-10-CM

## 2019-12-19 DIAGNOSIS — E870 Hyperosmolality and hypernatremia: Secondary | ICD-10-CM | POA: Clinically undetermined

## 2019-12-19 LAB — GLUCOSE, CAPILLARY
Glucose-Capillary: 128 mg/dL — ABNORMAL HIGH (ref 70–99)
Glucose-Capillary: 124 mg/dL — ABNORMAL HIGH (ref 70–99)
Glucose-Capillary: 147 mg/dL — ABNORMAL HIGH (ref 70–99)
Glucose-Capillary: 191 mg/dL — ABNORMAL HIGH (ref 70–99)

## 2019-12-19 LAB — CBC
HCT: 36 % (ref 36.0–46.0)
Hemoglobin: 10.8 g/dL — ABNORMAL LOW (ref 12.0–15.0)
MCH: 29.1 pg (ref 26.0–34.0)
MCHC: 30 g/dL (ref 30.0–36.0)
MCV: 97 fL (ref 80.0–100.0)
Platelets: 277 10*3/uL (ref 150–400)
RBC: 3.71 MIL/uL — ABNORMAL LOW (ref 3.87–5.11)
RDW: 12.6 % (ref 11.5–15.5)
WBC: 10.7 10*3/uL — ABNORMAL HIGH (ref 4.0–10.5)
nRBC: 0 % (ref 0.0–0.2)

## 2019-12-19 LAB — BASIC METABOLIC PANEL
Anion gap: 8 (ref 5–15)
BUN: 32 mg/dL — ABNORMAL HIGH (ref 8–23)
CO2: 31 mmol/L (ref 22–32)
Calcium: 13.4 mg/dL (ref 8.9–10.3)
Chloride: 115 mmol/L — ABNORMAL HIGH (ref 98–111)
Creatinine, Ser: 1.52 mg/dL — ABNORMAL HIGH (ref 0.44–1.00)
GFR calc Af Amer: 35 mL/min — ABNORMAL LOW (ref >60)
GFR calc non Af Amer: 30 mL/min — ABNORMAL LOW (ref >60)
Glucose, Bld: 148 mg/dL — ABNORMAL HIGH (ref 70–99)
Potassium: 3.6 mmol/L (ref 3.5–5.1)
Sodium: 154 mmol/L — ABNORMAL HIGH (ref 135–145)

## 2019-12-19 LAB — VITAMIN D 25 HYDROXY (VIT D DEFICIENCY, FRACTURES): Vit D, 25-Hydroxy: 28.41 ng/mL — ABNORMAL LOW (ref 30–100)

## 2019-12-19 LAB — TSH: TSH: 2.616 u[IU]/mL (ref 0.350–4.500)

## 2019-12-19 MED ORDER — DEXTROSE 5 % IV SOLN: INTRAVENOUS | Status: DC

## 2019-12-19 MED ORDER — INSULIN ASPART 100 UNIT/ML ~~LOC~~ SOLN
0.0000 [IU] | Freq: Four times a day (QID) | SUBCUTANEOUS | Status: DC
  Administered 2019-12-19: 1 [IU] via SUBCUTANEOUS
  Administered 2019-12-20 (×2): 3 [IU] via SUBCUTANEOUS
  Administered 2019-12-20: 5 [IU] via SUBCUTANEOUS

## 2019-12-19 NOTE — Progress Notes (Signed)
Patient [name] placed and placement verified earlier in shift via kub. This Probation officer has contacted dietician  x3 charge nurse informed. No orders at time for feeding supplement.  Adria Devon, LPN

## 2019-12-19 NOTE — Progress Notes (Signed)
Occupational Therapy Session Note  Patient Details  Name: [name] MRN: [id number] Date of Birth: 1928/07/07  Today's Date: 12/19/2019 OT Individual Time: 1105-1200 OT Individual Time Calculation (min): 55 min   Short Term Goals: Week 1:  OT Short Term Goal 1 (Week 1): Pt will maintain dynamic balance during self care tasks at mod A or less for 5 minutes. OT Short Term Goal 2 (Week 1): Pt will perform UB dressing with mod A. OT Short Term Goal 3 (Week 1): Pt will perform functional transfer with assist of 1 person safely.  Skilled Therapeutic Interventions/Progress Updates:    Pt greeted in bed, declining pain, agreeable to get up in her w/c. She was able to point to the shirt she wanted to wear when given significantly increased time due to delayed processing. +2 for perihygiene and brief change bedlevel with pt requiring Total A for rolling Lt>Rt. Pt incontinent of urine in brief. +2 for supine<sit and Max A of 1 for sitting balance while 2nd person positioned [place]. Max Ax2 for sit<stand from elevated bed with pt attempting to assist once her Lt hand was placed on the bar. Pt then transferred to the [place]. When Lt hand was placed on pant fabric she was able to pull pants up from knee to thigh. Also able to kick out Lt LE to assist with threading limb into pants. +2 for sit<stand in [place] to pull pants up. At this point she needed Max facilitation to not lean body over [place] bar. Pt unable to tolerate much mobility of either LE today due to grimacing and pain symptoms. Total A for lotion application and footwear while seated. Total A for donning overhead shirt with pt able to maintain anterior weight shift herself for short windows of time. Handwashing completed as well. HOH and significantly increased time needed for pt to initiate and assist during all self care tasks. RN requested for pt to return to bed for NG tube placement at end of session. +2 for [place] transfer back to bed. Pt remained  in bed with hemiplegic side protected, call bell in lap, and bed alarm set.   Therapy Documentation Precautions:  Precautions Precautions: Fall Restrictions Weight Bearing Restrictions: No Vital Signs: Therapy Vitals Temp: 98.6 F (37 C) Temp Source: Oral Pulse Rate: 70 BP: (!) 186/65 Patient Position (if appropriate): Sitting Oxygen Therapy SpO2: 93 % O2 Device: Room Air ADL:       Therapy/Group: Individual Therapy  Coreyon Nicotra A [place] 12/19/2019, 12:31 PM

## 2019-12-19 NOTE — Progress Notes (Signed)
Swallow study completed recommendations continue to be n.p.o.  Plan to consult cortak to place tube for nutritional support.  Plan to discuss care with daughter as patient very well may need a PEG tube.

## 2019-12-19 NOTE — Progress Notes (Signed)
Physical Therapy Session Note  Patient Details  Name: [name] MRN: [id number] Date of Birth: 1927/12/26  Today's Date: 12/19/2019 PT Individual Time: 0805-0850 PT Individual Time Calculation (min): 45 min   Short Term Goals: Week 1:  PT Short Term Goal 1 (Week 1): pt will remain out of bed 1 hour between therapies. PT Short Term Goal 2 (Week 1): Pt will perform bed<>WC transfer with max assist of 1 person PT Short Term Goal 3 (Week 1): Pt will attend to R side 25 % the time with moderate cues in functional tasks. PT Short Term Goal 4 (Week 1): Pt will initiate WC mobility  Skilled Therapeutic Interventions/Progress Updates: pt presented in bed sleeping with CPAP but easily aroused and agreeable to therapy. Session focused on bed mobility, scanning to R and wt bearing through RLE via forced use. Pt performed rolling L/R with maxA as second person donned pants total A. Pt demonstrated some initiation of reaching for bed rail with RUE when turning to L however required total A when turning to R. Performed supine to sit maxA with +1 for safety due to initial poor trunk control. PTA used draw sheet to level hips as sitting EOB. Pt was able to maintain brief bouts of sitting in midline with CGA however would begin to demonstrate increased R lateral and forward lean, requiring up to modA for correction. Performed STS maxA x 2 in [place] with transfer to [place]. Pt then transported to ortho gym and participated in [place] for sustained task and scanning to R. Participated in Mode A x 2 min then 77mn. During first bout pt required [facility] initiate task and mod verbal cues to scan to R to find red light. Pt with avg reaction time of 32 sec for both L and R quadrants. On second bout pt with improved reaction time of 9.05sec L and 9.9 sec R with pt able to reach and press buttons without cues and obtain 9 bilaterally within 3 min period. Pt transported back to room and requested by nsg to return to  bed for MBS. Performed STS with [place] maxA x 2 and transported back to bed with sit to supine of maxA. Pt required total A to [facility] Pt left in bed with hand off to transport staff for scheduled MBS.        Therapy Documentation Precautions:  Precautions Precautions: Fall Restrictions Weight Bearing Restrictions: No General:   Vital Signs: Therapy Vitals Temp: 98.1 F (36.7 C) Temp Source: Oral Pulse Rate: 73 Resp: 20 BP: (!) 189/71 Patient Position (if appropriate): Lying Oxygen Therapy SpO2: 97 % O2 Device: Room Air Pain: Pain Assessment Pain Scale: 0-10 Pain Score: 0-No pain Mobility:   Locomotion :    Trunk/Postural Assessment :    Balance:   Exercises:   Other Treatments:      Therapy/Group: Individual Therapy  Silas Muff 12/19/2019, 4:36 PM

## 2019-12-19 NOTE — Progress Notes (Signed)
Occupational Therapy Note  Patient Details  Name: [name] MRN: [id number] Date of Birth: 08-15-1928  Today's Date: 12/19/2019 OT Missed Time: 55 Minutes Missed Time Reason: X-Ray  Pt unavailable secondary to getting x-ray for NG tube placement. OT will follow up when available.    Darleen Crocker P MS, OTR/L 12/19/2019, 4:16 PM

## 2019-12-19 NOTE — Progress Notes (Signed)
[place] PHYSICAL MEDICINE & REHABILITATION PROGRESS NOTE   Subjective/Complaints:  Pt sitting up in manual w/c- did work to stand with PT and OT in cera steady- however was nonverbal- looked at me, but didn't respond verbally or nonverbally to my interactions.   Currently had NGT placed- and removed and put back in since wasn't in stomach- waiting on report/images from most recent KUB.   ROS: pt nonverbal this AM- couldn't get ROS Objective:   DG Abd Portable 1V  Result Date: 12/19/2019 CLINICAL DATA:  NG tube placement. EXAM: PORTABLE ABDOMEN - 1 VIEW COMPARISON:  12/13/2019 FINDINGS: There is no visible NG tube in the lower chest or in the abdomen.Bowel gas pattern is normal. Lung bases are clear. No acute bone abnormality. IMPRESSION: No visible NG tube in the lower chest or abdomen. Electronically Signed   By: Lorriane Shire M.D.   On: 12/19/2019 13:43   DG Swallowing Func-Speech Pathology  Result Date: 12/19/2019 Objective Swallowing Evaluation: Type of Study: MBS-Modified Barium Swallow Study  Patient Details Name: AIMI ESSNER MRN: [id number] Date of Birth: 84-05-1928 Today's Date: 12/19/2019 Time: SLP Start Time (ACUTE ONLY): 0901 -SLP Stop Time (ACUTE ONLY): 0920 SLP Time Calculation (min) ): 19 min Past Medical History: Past Medical History: Diagnosis Date . Anemia  . Arthritis  . Celiac disease  . Chronic systolic CHF (congestive heart failure) ([place])  . Complete heart block ([place])  . Coronary artery disease  . Diabetes mellitus   INSULIN DEPENDENT . GERD (gastroesophageal reflux disease)  . Glaucoma  . Headache(784.0)  . Heart disease  . Hypercholesterolemia  . Hypertension  . Iron deficiency anemia, unspecified  . Ischemic cardiomyopathy  . Memory difficulties 04/14/2019 . Shortness of breath  . Sleep apnea   uses cpap . Stroke ([place])  . Unspecified constipation  Past Surgical History: Past Surgical History: Procedure Laterality Date . BACK SURGERY   . BI-VENTRICULAR PACEMAKER INSERTION  (CRT-P)  March 2014  [facility] . BIV PACEMAKER GENERATOR CHANGEOUT N/A 06/29/2019  Procedure: BIV PACEMAKER GENERATOR CHANGEOUT;  Surgeon: Evans Lance, MD;  Location: [place] [facility];  Service: Cardiovascular;  Laterality: N/A; . CARDIAC CATHETERIZATION  09/02/2012 . CARDIAC SURGERY   . CORONARY ANGIOPLASTY WITH STENT PLACEMENT  09/02/2012  RCA . PERCUTANEOUS CORONARY STENT INTERVENTION (PCI-S) N/A 09/02/2012  Procedure: PERCUTANEOUS CORONARY STENT INTERVENTION (PCI-S);  Surgeon: Clent Demark, MD;  Location: [facility] CATH LAB;  Service: Cardiovascular;  Laterality: N/A; . PERMANENT PACEMAKER INSERTION N/A 11/29/2012  Procedure: PERMANENT PACEMAKER INSERTION;  Surgeon: Deboraha Sprang, MD;  Location: [facility] CATH LAB;  Service: Cardiovascular;  Laterality: N/A; . TEMPORARY PACEMAKER INSERTION N/A 11/28/2012  Procedure: TEMPORARY PACEMAKER INSERTION;  Surgeon: Clent Demark, MD;  Location: [place] CATH LAB;  Service: Cardiovascular;  Laterality: N/A; HPI: Pt is a 86 y.o. F with significant PMH of CHF, DM, CAD with pacemaker, HTN, prior stroke, who presents with several falls in the prior 24 hours and RLE weakness. CT negative for acute abnormality. Pt unable to receive MRI due to pacemaker. Pt admitted to [facility] 12/14/19.  Assessment / Plan / Recommendation CHL IP CLINICAL IMPRESSIONS 12/19/2019 Clinical Impression Pt presents with severe oropharyngeal dysphagia and is unsafe for PO intake at this time. Pt was awake and focused attention was, but she was only able to trigger volitional swallow on command X1 during barium trials across various textures ranging from thin to puree. Pt exhibited severe oral holding of small puree, nectar, and thin boluses. When bolus size  increased by clinician in attempt to increase sensory input, nectar barium spilled anteriorly out of oral cavity as well as prematurely into pharynx with very little intentional oral manipulation of bolus by pt and she was unable to follow commands to trigger  volitional swallow. Only 1 reflexive swallow of thin barium was captured on imaging due to pt's decreased ability to follow commands during PO presentations, during which no aspiration or penetration was observed. However, during subsequent imaging, a trace amount of barium was visualized in laryngeal vestibule, which SLP suspects entered airway after the swallow, as a result of either lingual/palatal or vallecular residue. Suctioning was required during 100% PO presentations throughout study today. Given severity of pt's oral phase deficits and reduced ability to follow commands, PO intake is inefficient and unsafe at this time. Recommend pt continue NPO and medical team may wish to pursue alternative means of nutrition and medication administration. ST will continue to provide skilled interventions to work toward readiness for repeat MBSS to assess potential for diet advancement. SLP Visit Diagnosis Dysphagia, oropharyngeal phase (R13.12) Attention and concentration deficit following -- Frontal lobe and executive function deficit following -- Impact on safety and function Severe aspiration risk   CHL IP TREATMENT RECOMMENDATION 12/08/2019 Treatment Recommendations Therapy as outlined in treatment plan below   Prognosis 12/08/2019 Prognosis for Safe Diet Advancement Fair Barriers to Reach Goals Cognitive deficits Barriers/Prognosis Comment -- CHL IP DIET RECOMMENDATION 12/19/2019 SLP Diet Recommendations NPO;Alternative means - temporary;Alternative means - long-term Liquid Administration via -- Medication Administration Via alternative means Compensations -- Postural Changes --   CHL IP OTHER RECOMMENDATIONS 12/19/2019 Recommended Consults -- Oral Care Recommendations Oral care QID Other Recommendations --   CHL IP FOLLOW UP RECOMMENDATIONS 12/13/2019 Follow up Recommendations 24 hour supervision/assistance;Skilled Nursing facility;Inpatient Rehab   CHL IP FREQUENCY AND DURATION 12/08/2019 Speech Therapy Frequency (ACUTE  ONLY) min 2x/week Treatment Duration 2 weeks      CHL IP ORAL PHASE 12/19/2019 Oral Phase Impaired Oral - Pudding Teaspoon -- Oral - Pudding Cup -- Oral - Honey Teaspoon -- Oral - Honey Cup -- Oral - Nectar Teaspoon -- Oral - Nectar Cup Left anterior bolus loss;Right anterior bolus loss;Weak lingual manipulation;Lingual pumping;Incomplete tongue to palate contact;Reduced posterior propulsion;Holding of bolus;Delayed oral transit;Decreased bolus cohesion;Premature spillage;Lingual/palatal residue Oral - Nectar Straw -- Oral - Thin Teaspoon -- Oral - Thin Cup Left anterior bolus loss;Right anterior bolus loss;Weak lingual manipulation;Reduced posterior propulsion;Decreased bolus cohesion;Holding of bolus;Delayed oral transit;Lingual/palatal residue Oral - Thin Straw NT Oral - Puree Weak lingual manipulation;Decreased bolus cohesion;Delayed oral transit;Reduced posterior propulsion;Holding of bolus;Lingual pumping Oral - Mech Soft -- Oral - Regular -- Oral - Multi-Consistency -- Oral - Pill -- Oral Phase - Comment --  CHL IP PHARYNGEAL PHASE 12/19/2019 Pharyngeal Phase Impaired Pharyngeal- Pudding Teaspoon -- Pharyngeal -- Pharyngeal- Pudding Cup -- Pharyngeal -- Pharyngeal- Honey Teaspoon -- Pharyngeal -- Pharyngeal- Honey Cup -- Pharyngeal -- Pharyngeal- Nectar Teaspoon -- Pharyngeal -- Pharyngeal- Nectar Cup Penetration/Apiration after swallow Pharyngeal Material enters airway, CONTACTS cords and not ejected out Pharyngeal- Nectar Straw -- Pharyngeal -- Pharyngeal- Thin Teaspoon -- Pharyngeal -- Pharyngeal- Thin Cup Reduced anterior laryngeal mobility;Reduced laryngeal elevation;Delayed swallow initiation-pyriform sinuses Pharyngeal -- Pharyngeal- Thin Straw -- Pharyngeal -- Pharyngeal- Puree -- Pharyngeal -- Pharyngeal- Mechanical Soft -- Pharyngeal -- Pharyngeal- Regular -- Pharyngeal -- Pharyngeal- Multi-consistency -- Pharyngeal -- Pharyngeal- Pill -- Pharyngeal -- Pharyngeal Comment --  CHL IP CERVICAL ESOPHAGEAL  PHASE 12/19/2019 Cervical Esophageal Phase WFL Pudding Teaspoon -- Pudding Cup -- Honey  Teaspoon -- Honey Cup -- Nectar Teaspoon -- Nectar Cup -- Nectar Straw -- Thin Teaspoon -- Thin Cup -- Thin Straw -- Puree -- Mechanical Soft -- Regular -- Multi-consistency -- Pill -- Cervical Esophageal Comment -- Arbutus Leas 12/19/2019, 9:56 AM              Recent Labs    12/18/19 0951 12/19/19 0434  WBC 11.3* 10.7*  HGB 12.3 10.8*  HCT 40.3 36.0  PLT 279 277   Recent Labs    12/18/19 0951 12/19/19 0434  NA 151* 154*  K 4.0 3.6  CL 114* 115*  CO2 31 31  GLUCOSE 207* 148*  BUN 31* 32*  CREATININE 1.52* 1.52*  CALCIUM 13.7* 13.4*    Intake/Output Summary (Last 24 hours) at 12/19/2019 1515 Last data filed at 12/19/2019 0320 Gross per 24 hour  Intake --  Output 700 ml  Net -700 ml     Physical Exam: Vital Signs Blood pressure (!) 189/71, pulse 73, temperature 98.1 F (36.7 C), temperature source Oral, resp. rate 20, height 5' 7"  (1.702 m), weight 78.7 kg, SpO2 97 %.   Constitutional: No distress . Vital signs reviewed. Pt sitting up in manual w/c after standing mod-max assist in cera steady with PT and OT; NAD HEENT:  oral membranes dry and lips dry Neck: supple Cardiovascular: RRR Respiratory/Chest: very gurgly and coarse sounds- like needs to cough up mucus but cannot; a few rhonchi also heard, adequate air movement   GI/Abdomen: soft, NT, ND, (+)BS Ext: no clubbing, cyanosis, or edema Skin:No rashes seenLarge egg-sized lipoma right shoulder.  Neurologic: less alert- was watching me, but didn't respond verbally or nonverbally to me;   motor strength is 3-4/5 in Left, R side no movement in RUE/RLE Sensory exam normal sensation to light touch  in bilateral upper and lower extremities as well as facial DTR's 2++ RUE RLE Right CN VII and poor oral-motor control, decreased gag/cough gurgly sounding- not handling her own secretions-  No evidence of nystagmus Musculoskeletal: no joint  pain   Assessment/Plan: 1. Functional deficits secondary to Left brainstem vs subcortical infarct which require 3+ hours per day of interdisciplinary therapy in a comprehensive inpatient rehab setting.  Physiatrist is providing close team supervision and 24 hour management of active medical problems listed below.  Physiatrist and rehab team continue to assess barriers to discharge/monitor patient progress toward functional and medical goals  Care Tool:  Bathing  Bathing activity did not occur: Safety/medical concerns Body parts bathed by patient: Face   Body parts bathed by helper: Right arm, Left arm, Chest, Abdomen, Front perineal area, Buttocks, Right upper leg, Left upper leg, Right lower leg, Left lower leg     Bathing assist Assist Level: 2 Helpers     Upper Body Dressing/Undressing Upper body dressing   What is the patient wearing?: Pull over shirt    Upper body assist Assist Level: Total Assistance - Patient < 25%    Lower Body Dressing/Undressing Lower body dressing      What is the patient wearing?: Incontinence brief, Pants     Lower body assist Assist for lower body dressing: 2 Helpers     Toileting Toileting Toileting Activity did not occur (Clothing management and hygiene only): N/A (no void or bm)  Toileting assist Assist for toileting: 2 Helpers     Transfers Chair/bed transfer  Transfers assist  Chair/bed transfer activity did not occur: Safety/medical concerns  Chair/bed transfer assist level: Dependent - mechanical lift  Locomotion Ambulation   Ambulation assist   Ambulation activity did not occur: Safety/medical concerns          Walk 10 feet activity   Assist  Walk 10 feet activity did not occur: Safety/medical concerns        Walk 50 feet activity   Assist Walk 50 feet with 2 turns activity did not occur: Safety/medical concerns         Walk 150 feet activity   Assist Walk 150 feet activity did not occur:  Safety/medical concerns         Walk 10 feet on uneven surface  activity   Assist Walk 10 feet on uneven surfaces activity did not occur: Safety/medical concerns         Wheelchair     Assist Will patient use wheelchair at discharge?: Yes Type of Wheelchair: Manual Wheelchair activity did not occur: Safety/medical concerns         Wheelchair 50 feet with 2 turns activity    Assist    Wheelchair 50 feet with 2 turns activity did not occur: Safety/medical concerns       Wheelchair 150 feet activity     Assist  Wheelchair 150 feet activity did not occur: Safety/medical concerns       Blood pressure (!) 189/71, pulse 73, temperature 98.1 F (36.7 C), temperature source Oral, resp. rate 20, height 5' 7"  (1.702 m), weight 78.7 kg, SpO2 97 %.    Medical Problem List and Plan: 1.  Rightt side hemiplegia, dysarthria, +/- aphasia secondary to left brain infarction.  MRI not completed due to pacemaker  -CT demonstrates hypoattenuation in left pons which is consistent with current presentation---increased right hp and increased difficulty with oro-pharyngeal control             -patient may shower             -ELOS/Goals: 25-30 days/min/mod a             PT, OT, SLP  2.  Antithrombotics: -DVT/anticoagulation: Subcutaneous Lovenox             -antiplatelet therapy: Aspirin 81 mg daily, Plavix 75 mg daily 3. Pain Management: Tylenol as needed 4. Mood with memory deficits.  Aricept 10 mg nightly             -antipsychotic agents: N/A 5. Neuropsych: This patient is NOT capable of making decisions on her own behalf. 6. Skin/Wound Care: Routine skin checks 7. Fluids/Electrolytes/Nutrition: Routine in and outs.  CMP ordered for tomorrow a.m. 8.  Hypertension.  Norvasc 2.5 mg daily, Coreg 3.125 mg twice daily.               Monitor with increased mobility Vitals:   12/19/19 1026 12/19/19 1352  BP: (!) 186/65 (!) 189/71  Pulse: 70 73  Resp:  20  Temp: 98.6 F  (37 C) 98.1 F (36.7 C)  SpO2: 93% 97%   -2/28 permissive HTN for this week given small vessel disease  3/1- BP still elevated- permissive HTN this next week.  9.  Diabetes mellitus with hyperglycemia.  Hemoglobin A1c 8.2.  Lantus insulin 10 units twice daily, NovoLog 2 units 3 times daily check blood sugars before meals and at bedtime              CBG (last 3)  Recent Labs    12/18/19 2103 12/19/19 0626 12/19/19 1202  GLUCAP 119* 128* 124*   CBGs elevated increase lantus dose 14U BID  2/28 improving control, not eating much though  -reduce lantus while npo tp 6u bid,hold scheduled novolog   3/1- will monitor because changing to cortrak/TFs and will get TF orders via Nutrition.   10.  CAD with stenting as well as pacemaker.  Continue aspirin and Plavix. 11.  CKD stage IIIb.  Creatinine baseline 1.56.               Cr 1.26 on 2/25  3/1- Cr 1.52- at baseline 12.  Chronic diastolic congestive heart failure.  Monitor for any signs of fluid overload             Daily weights 13.  OSA.  CPAP. 14.  Hyperlipidemia.  Lipitor 15.  Chronic normocytic anemia.  Continue iron supplement             hgb 10 16.  Post stroke dysphagia: Dysphagia #2 thin liquids.  Follow-up speech therapy.  Advance as tolerated  2/27 diet downgraded to D1/nectar given pontine infarct/presentation   -aspiration precautions, close observation   -low threshold to make npo, re-check cxr  2/28 still struggling with mgt of secretions d/t oro-pharyngeal weakness.   -make npo   -start IVF for now   -MBS tomorrow  3/1- is NPO per swallowing testing- Cortrak being placed and xray redone to make sure in right place- will check with nutrition to start TFs 17.  UTI/Citrobacter/EColi: S to . Keflex finish 7d course   -low grade temp yesterday  -change keflex to rocephin1g daily for 2 days  3/1- completed today- wil recheck CBC tomorrow to make sure not increasing after finishing ABX  18. Hypernatremia  3/1- needs more  fluids- per IM, ordered D5water for now 100cc/hour- will reassess after Na has improved 19. Hypercalcemia  3/1- Ca++ is 13.4- concerning- has been an issue for awhile per IM notes- will get sodium controlled and see if Ca improves as well with fluids- if not, needs additional w/u- some work up already started-  Agree with PTH, PTHrP, TSH, 25vitD, 1,25VitD, SPEP- many of which are already being done.      LOS: 5 days A FACE TO FACE EVALUATION WAS PERFORMED  Gianfranco Araki 12/19/2019, 3:15 PM

## 2019-12-19 NOTE — Consult Note (Signed)
Reason for Consult: Hypercalcemia Referring Physician:  Marlowe Shores, PA-C  Chief Complaint: Recent CVA  Assessment/Plan: 1. Hypercalcemia - continues to increase and has history of periodic elevation which is more consistent with immobilization + prerenal azotemia although she certainly merits a full workup. I do not see any Calcium or VitD administration.  - Agree with PTH, PTHrP, TSH, 25vitD, 1,25VitD, SPEP - Replete volume with free water; free water deficit of >4L suggests she's very volume down with limited intake of free water. - If no improvement in Ca then may need malignancy w/u.  2. Hypernatremia with a free water deficit of 3.9L - should treat this as chronic hypernatremia as there are 3 days without labs and previously Na was 141. Agree with changing from 1/2NS to  D5W and will also check a free water clearance to determine if she's losing free water renally. 3. CHF with EF 50-55% - appears compensated 4. Anemia 5. DM   HPI: [name] is an 86 y.o. female HTN, heart block s/p PM, DM type 2, OSA on CPAP, CASHD s/p PCI, CVA recently hospitalized with rt sided hemiparesis with hospital course complicated by UTI with Citrobacter and E.Coli treated with Meropenem -> Kelflex. Was on a dysphagia diet but has been having a large amount of secretions and likely to need a NGT for feeds. Ca has been on high side of normal but after a few days it is currently in the 13's. She requires at least 2 therapists to help her move about and has not been eating or drinking much; she has been on 1/2 NS.   ROS Pertinent items are noted in HPI.  Chemistry and CBC: Creatinine, Ser  Date/Time Value Ref Range Status  12/19/2019 04:34 AM 1.52 (H) 0.44 - 1.00 mg/dL Final  12/18/2019 09:51 AM 1.52 (H) 0.44 - 1.00 mg/dL Final  12/15/2019 06:43 AM 1.26 (H) 0.44 - 1.00 mg/dL Final  12/14/2019 05:52 AM 1.37 (H) 0.44 - 1.00 mg/dL Final  12/13/2019 03:38 AM 1.21 (H) 0.44 - 1.00 mg/dL Final  12/12/2019  03:09 AM 1.42 (H) 0.44 - 1.00 mg/dL Final  12/11/2019 03:46 AM 1.22 (H) 0.44 - 1.00 mg/dL Final  12/10/2019 03:41 AM 1.29 (H) 0.44 - 1.00 mg/dL Final  12/09/2019 04:33 AM 1.38 (H) 0.44 - 1.00 mg/dL Final  12/08/2019 03:48 AM 1.26 (H) 0.44 - 1.00 mg/dL Final  12/07/2019 07:48 AM 1.24 (H) 0.44 - 1.00 mg/dL Final  12/06/2019 04:50 PM 1.62 (H) 0.44 - 1.00 mg/dL Final  11/07/2019 07:19 PM 1.24 (H) 0.44 - 1.00 mg/dL Final  11/04/2019 02:17 PM 1.09 (H) 0.44 - 1.00 mg/dL Final  10/12/2019 08:09 PM 1.50 (H) 0.44 - 1.00 mg/dL Final  10/12/2019 07:52 PM 1.56 (H) 0.44 - 1.00 mg/dL Final  10/09/2019 05:52 PM 1.10 (H) 0.44 - 1.00 mg/dL Final  10/09/2019 05:19 PM 1.24 (H) 0.44 - 1.00 mg/dL Final  07/02/2019 04:37 AM 1.79 (H) 0.44 - 1.00 mg/dL Final  07/01/2019 06:57 PM 2.00 (H) 0.44 - 1.00 mg/dL Final  07/01/2019 06:52 PM 2.10 (H) 0.44 - 1.00 mg/dL Final  06/29/2019 07:59 AM 1.42 (H) 0.44 - 1.00 mg/dL Final  08/07/2015 04:16 PM 1.56 (H) 0.44 - 1.00 mg/dL Final  01/21/2015 11:51 AM 1.12 (H) 0.50 - 1.10 mg/dL Final  12/14/2013 04:22 PM 1.31 (H) 0.57 - 1.00 mg/dL Final  09/14/2013 11:29 AM 1.10 (H) 0.57 - 1.00 mg/dL Final  03/28/2013 10:21 AM 1.15 (H) 0.57 - 1.00 mg/dL Final  11/29/2012 05:30 AM 1.28 (H)  0.50 - 1.10 mg/dL Final  11/28/2012 06:32 PM 1.20 (H) 0.50 - 1.10 mg/dL Final  11/28/2012 02:54 PM 1.37 (H) 0.50 - 1.10 mg/dL Final  10/25/2012 11:38 AM 1.27 (H) 0.50 - 1.10 mg/dL Final  10/22/2012 03:10 PM 1.31 (H) 0.50 - 1.10 mg/dL Final  10/20/2012 12:00 PM 2.10 (H) 0.50 - 1.10 mg/dL Final  10/18/2012 07:33 AM 1.37 (H) 0.50 - 1.10 mg/dL Final  10/17/2012 06:55 AM 1.16 (H) 0.50 - 1.10 mg/dL Final  09/03/2012 06:16 AM 1.26 (H) 0.50 - 1.10 mg/dL Final  12/27/2011 08:21 PM 1.24 (H) 0.50 - 1.10 mg/dL Final  08/06/2011 05:45 PM 1.08 0.50 - 1.10 mg/dL Final   Recent Labs  Lab 12/13/19 0338 12/14/19 0552 12/15/19 0643 12/18/19 0951 12/19/19 0434  NA 137 142 141 151* 154*  K 4.5 4.5 4.4 4.0 3.6   CL 102 105 105 114* 115*  CO2 27 29 28 31 31   GLUCOSE 230* 232* 283* 207* 148*  BUN 21 27* 30* 31* 32*  CREATININE 1.21* 1.37* 1.26* 1.52* 1.52*  CALCIUM 11.4* 12.2* 12.2* 13.7* 13.4*   Recent Labs  Lab 12/13/19 0338 12/13/19 0338 12/14/19 0552 12/15/19 0643 12/18/19 0951 12/19/19 0434  WBC 12.6*   < > 10.7* 10.2 11.3* 10.7*  NEUTROABS 9.1*  --  7.5 7.3  --   --   HGB 10.1*   < > 10.1* 10.0* 12.3 10.8*  HCT 32.2*   < > 32.3* 32.4* 40.3 36.0  MCV 91.7   < > 92.3 93.4 95.5 97.0  PLT 214   < > 212 231 279 277   < > = values in this interval not displayed.   Liver Function Tests: Recent Labs  Lab 12/15/19 0643 12/18/19 0951  AST 38 27  ALT 33 27  ALKPHOS 61 69  BILITOT 0.2* 0.6  PROT 6.5 6.9  ALBUMIN 3.1* 3.2*   No results for input(s): LIPASE, AMYLASE in the last 168 hours. No results for input(s): AMMONIA in the last 168 hours. Cardiac Enzymes: No results for input(s): CKTOTAL, CKMB, CKMBINDEX, TROPONINI in the last 168 hours. Iron Studies: No results for input(s): IRON, TIBC, TRANSFERRIN, FERRITIN in the last 72 hours. PT/INR: @LABRCNTIP (inr:5)  Xrays/Other Studies: ) Results for orders placed or performed during the hospital encounter of 12/14/19 (from the past 48 hour(s))  Glucose, capillary     Status: Abnormal   Collection Time: 12/17/19 12:07 PM  Result Value Ref Range   Glucose-Capillary 163 (H) 70 - 99 mg/dL    Comment: Glucose reference range applies only to samples taken after fasting for at least 8 hours.  Glucose, capillary     Status: Abnormal   Collection Time: 12/17/19  4:55 PM  Result Value Ref Range   Glucose-Capillary 163 (H) 70 - 99 mg/dL    Comment: Glucose reference range applies only to samples taken after fasting for at least 8 hours.  Glucose, capillary     Status: Abnormal   Collection Time: 12/17/19  9:32 PM  Result Value Ref Range   Glucose-Capillary 101 (H) 70 - 99 mg/dL    Comment: Glucose reference range applies only to samples  taken after fasting for at least 8 hours.  Glucose, capillary     Status: Abnormal   Collection Time: 12/18/19  6:16 AM  Result Value Ref Range   Glucose-Capillary 130 (H) 70 - 99 mg/dL    Comment: Glucose reference range applies only to samples taken after fasting for at least 8 hours.  CBC  Status: Abnormal   Collection Time: 12/18/19  9:51 AM  Result Value Ref Range   WBC 11.3 (H) 4.0 - 10.5 K/uL   RBC 4.22 3.87 - 5.11 MIL/uL   Hemoglobin 12.3 12.0 - 15.0 g/dL   HCT 40.3 36.0 - 46.0 %   MCV 95.5 80.0 - 100.0 fL   MCH 29.1 26.0 - 34.0 pg   MCHC 30.5 30.0 - 36.0 g/dL   RDW 12.7 11.5 - 15.5 %   Platelets 279 150 - 400 K/uL   nRBC 0.0 0.0 - 0.2 %    Comment: Performed at [facility], [place] [address] [place], [place] [address]  Comprehensive metabolic panel     Status: Abnormal   Collection Time: 12/18/19  9:51 AM  Result Value Ref Range   Sodium 151 (H) 135 - 145 mmol/L   Potassium 4.0 3.5 - 5.1 mmol/L   Chloride 114 (H) 98 - 111 mmol/L   CO2 31 22 - 32 mmol/L   Glucose, Bld 207 (H) 70 - 99 mg/dL    Comment: Glucose reference range applies only to samples taken after fasting for at least 8 hours.   BUN 31 (H) 8 - 23 mg/dL   Creatinine, Ser 1.52 (H) 0.44 - 1.00 mg/dL   Calcium 13.7 (HH) 8.9 - 10.3 mg/dL    Comment: CRITICAL RESULT CALLED TO, READ BACK BY AND VERIFIED WITH: RN M NIDA AT 1030 12/18/19 BY L BENFIELD    Total Protein 6.9 6.5 - 8.1 g/dL   Albumin 3.2 (L) 3.5 - 5.0 g/dL   AST 27 15 - 41 U/L   ALT 27 0 - 44 U/L   Alkaline Phosphatase 69 38 - 126 U/L   Total Bilirubin 0.6 0.3 - 1.2 mg/dL   GFR calc non Af Amer 30 (L) >60 mL/min   GFR calc Af Amer 35 (L) >60 mL/min   Anion gap 6 5 - 15    Comment: Performed at [facility], [place] [address] [place], [place] [address]  Glucose, capillary     Status: Abnormal   Collection Time: 12/18/19 11:58 AM  Result Value Ref Range   Glucose-Capillary 163 (H) 70 - 99 mg/dL    Comment: Glucose reference range  applies only to samples taken after fasting for at least 8 hours.  Glucose, capillary     Status: Abnormal   Collection Time: 12/18/19  4:06 PM  Result Value Ref Range   Glucose-Capillary 139 (H) 70 - 99 mg/dL    Comment: Glucose reference range applies only to samples taken after fasting for at least 8 hours.  Glucose, capillary     Status: Abnormal   Collection Time: 12/18/19  9:03 PM  Result Value Ref Range   Glucose-Capillary 119 (H) 70 - 99 mg/dL    Comment: Glucose reference range applies only to samples taken after fasting for at least 8 hours.  Basic metabolic panel     Status: Abnormal   Collection Time: 12/19/19  4:34 AM  Result Value Ref Range   Sodium 154 (H) 135 - 145 mmol/L   Potassium 3.6 3.5 - 5.1 mmol/L   Chloride 115 (H) 98 - 111 mmol/L   CO2 31 22 - 32 mmol/L   Glucose, Bld 148 (H) 70 - 99 mg/dL    Comment: Glucose reference range applies only to samples taken after fasting for at least 8 hours.   BUN 32 (H) 8 - 23 mg/dL   Creatinine, Ser 1.52 (H)  0.44 - 1.00 mg/dL   Calcium 13.4 (HH) 8.9 - 10.3 mg/dL    Comment: CRITICAL RESULT CALLED TO, READ BACK BY AND VERIFIED WITH: VICTORIA TOTTEN,RN AT 0721 12/19/2019 BY ZBEECH.    GFR calc non Af Amer 30 (L) >60 mL/min   GFR calc Af Amer 35 (L) >60 mL/min   Anion gap 8 5 - 15    Comment: Performed at [facility] [address], [place], [place] [address]  CBC     Status: Abnormal   Collection Time: 12/19/19  4:34 AM  Result Value Ref Range   WBC 10.7 (H) 4.0 - 10.5 K/uL   RBC 3.71 (L) 3.87 - 5.11 MIL/uL   Hemoglobin 10.8 (L) 12.0 - 15.0 g/dL   HCT 36.0 36.0 - 46.0 %   MCV 97.0 80.0 - 100.0 fL   MCH 29.1 26.0 - 34.0 pg   MCHC 30.0 30.0 - 36.0 g/dL   RDW 12.6 11.5 - 15.5 %   Platelets 277 150 - 400 K/uL   nRBC 0.0 0.0 - 0.2 %    Comment: Performed at [facility], [place] [address], [place], [place] [address]  Glucose, capillary     Status: Abnormal   Collection Time: 12/19/19  6:26 AM  Result Value  Ref Range   Glucose-Capillary 128 (H) 70 - 99 mg/dL    Comment: Glucose reference range applies only to samples taken after fasting for at least 8 hours.   DG Swallowing Func-Speech Pathology  Result Date: 12/19/2019 Objective Swallowing Evaluation: Type of Study: MBS-Modified Barium Swallow Study  Patient Details Name: [name] MRN: [id number] Date of Birth: Feb 25, 1928 Today's Date: 12/19/2019 Time: SLP Start Time (ACUTE ONLY): 0901 -SLP Stop Time (ACUTE ONLY): 0920 SLP Time Calculation (min) ): 19 min Past Medical History: Past Medical History: Diagnosis Date . Anemia  . Arthritis  . Celiac disease  . Chronic systolic CHF (congestive heart failure) ([place])  . Complete heart block ([place])  . Coronary artery disease  . Diabetes mellitus   INSULIN DEPENDENT . GERD (gastroesophageal reflux disease)  . Glaucoma  . Headache(784.0)  . Heart disease  . Hypercholesterolemia  . Hypertension  . Iron deficiency anemia, unspecified  . Ischemic cardiomyopathy  . Memory difficulties 04/14/2019 . Shortness of breath  . Sleep apnea   uses cpap . Stroke ([place])  . Unspecified constipation  Past Surgical History: Past Surgical History: Procedure Laterality Date . BACK SURGERY   . BI-VENTRICULAR PACEMAKER INSERTION (CRT-P)  March 2014  [facility] . BIV PACEMAKER GENERATOR CHANGEOUT N/A 06/29/2019  Procedure: BIV PACEMAKER GENERATOR CHANGEOUT;  Surgeon: Evans Lance, MD;  Location: [place] CV LAB;  Service: Cardiovascular;  Laterality: N/A; . CARDIAC CATHETERIZATION  09/02/2012 . CARDIAC SURGERY   . CORONARY ANGIOPLASTY WITH STENT PLACEMENT  09/02/2012  RCA . PERCUTANEOUS CORONARY STENT INTERVENTION (PCI-S) N/A 09/02/2012  Procedure: PERCUTANEOUS CORONARY STENT INTERVENTION (PCI-S);  Surgeon: Clent Demark, MD;  Location: [facility] CATH LAB;  Service: Cardiovascular;  Laterality: N/A; . PERMANENT PACEMAKER INSERTION N/A 11/29/2012  Procedure: PERMANENT PACEMAKER INSERTION;  Surgeon: Deboraha Sprang, MD;  Location: [facility] CATH LAB;   Service: Cardiovascular;  Laterality: N/A; . TEMPORARY PACEMAKER INSERTION N/A 11/28/2012  Procedure: TEMPORARY PACEMAKER INSERTION;  Surgeon: Clent Demark, MD;  Location: [facility] CATH LAB;  Service: Cardiovascular;  Laterality: N/A; HPI: Pt is a 86 y.o. F with significant PMH of CHF, DM, CAD with pacemaker, HTN, prior stroke, who presents with several falls in  the prior 24 hours and RLE weakness. CT negative for acute abnormality. Pt unable to receive MRI due to pacemaker. Pt admitted to [facility] 12/14/19.  Assessment / Plan / Recommendation CHL IP CLINICAL IMPRESSIONS 12/19/2019 Clinical Impression Pt presents with severe oropharyngeal dysphagia and is unsafe for PO intake at this time. Pt was awake and focused attention was, but she was only able to trigger volitional swallow on command X1 during barium trials across various textures ranging from thin to puree. Pt exhibited severe oral holding of small puree, nectar, and thin boluses. When bolus size increased by clinician in attempt to increase sensory input, nectar barium spilled anteriorly out of oral cavity as well as prematurely into pharynx with very little intentional oral manipulation of bolus by pt and she was unable to follow commands to trigger volitional swallow. Only 1 reflexive swallow of thin barium was captured on imaging due to pt's decreased ability to follow commands during PO presentations, during which no aspiration or penetration was observed. However, during subsequent imaging, a trace amount of barium was visualized in laryngeal vestibule, which SLP suspects entered airway after the swallow, as a result of either lingual/palatal or vallecular residue. Suctioning was required during 100% PO presentations throughout study today. Given severity of pt's oral phase deficits and reduced ability to follow commands, PO intake is inefficient and unsafe at this time. Recommend pt continue NPO and medical team may wish to pursue alternative means of nutrition  and medication administration. ST will continue to provide skilled interventions to work toward readiness for repeat MBSS to assess potential for diet advancement. SLP Visit Diagnosis Dysphagia, oropharyngeal phase (R13.12) Attention and concentration deficit following -- Frontal lobe and executive function deficit following -- Impact on safety and function Severe aspiration risk   CHL IP TREATMENT RECOMMENDATION 12/08/2019 Treatment Recommendations Therapy as outlined in treatment plan below   Prognosis 12/08/2019 Prognosis for Safe Diet Advancement Fair Barriers to Reach Goals Cognitive deficits Barriers/Prognosis Comment -- CHL IP DIET RECOMMENDATION 12/19/2019 SLP Diet Recommendations NPO;Alternative means - temporary;Alternative means - long-term Liquid Administration via -- Medication Administration Via alternative means Compensations -- Postural Changes --   CHL IP OTHER RECOMMENDATIONS 12/19/2019 Recommended Consults -- Oral Care Recommendations Oral care QID Other Recommendations --   CHL IP FOLLOW UP RECOMMENDATIONS 12/13/2019 Follow up Recommendations 24 hour supervision/assistance;Skilled Nursing facility;Inpatient Rehab   CHL IP FREQUENCY AND DURATION 12/08/2019 Speech Therapy Frequency (ACUTE ONLY) min 2x/week Treatment Duration 2 weeks      CHL IP ORAL PHASE 12/19/2019 Oral Phase Impaired Oral - Pudding Teaspoon -- Oral - Pudding Cup -- Oral - Honey Teaspoon -- Oral - Honey Cup -- Oral - Nectar Teaspoon -- Oral - Nectar Cup Left anterior bolus loss;Right anterior bolus loss;Weak lingual manipulation;Lingual pumping;Incomplete tongue to palate contact;Reduced posterior propulsion;Holding of bolus;Delayed oral transit;Decreased bolus cohesion;Premature spillage;Lingual/palatal residue Oral - Nectar Straw -- Oral - Thin Teaspoon -- Oral - Thin Cup Left anterior bolus loss;Right anterior bolus loss;Weak lingual manipulation;Reduced posterior propulsion;Decreased bolus cohesion;Holding of bolus;Delayed oral  transit;Lingual/palatal residue Oral - Thin Straw NT Oral - Puree Weak lingual manipulation;Decreased bolus cohesion;Delayed oral transit;Reduced posterior propulsion;Holding of bolus;Lingual pumping Oral - Mech Soft -- Oral - Regular -- Oral - Multi-Consistency -- Oral - Pill -- Oral Phase - Comment --  CHL IP PHARYNGEAL PHASE 12/19/2019 Pharyngeal Phase Impaired Pharyngeal- Pudding Teaspoon -- Pharyngeal -- Pharyngeal- Pudding Cup -- Pharyngeal -- Pharyngeal- Honey Teaspoon -- Pharyngeal -- Pharyngeal- Honey Cup -- Pharyngeal -- Pharyngeal- Nectar Teaspoon --  Pharyngeal -- Pharyngeal- Nectar Cup Penetration/Apiration after swallow Pharyngeal Material enters airway, CONTACTS cords and not ejected out Pharyngeal- Nectar Straw -- Pharyngeal -- Pharyngeal- Thin Teaspoon -- Pharyngeal -- Pharyngeal- Thin Cup Reduced anterior laryngeal mobility;Reduced laryngeal elevation;Delayed swallow initiation-pyriform sinuses Pharyngeal -- Pharyngeal- Thin Straw -- Pharyngeal -- Pharyngeal- Puree -- Pharyngeal -- Pharyngeal- Mechanical Soft -- Pharyngeal -- Pharyngeal- Regular -- Pharyngeal -- Pharyngeal- Multi-consistency -- Pharyngeal -- Pharyngeal- Pill -- Pharyngeal -- Pharyngeal Comment --  CHL IP CERVICAL ESOPHAGEAL PHASE 12/19/2019 Cervical Esophageal Phase WFL Pudding Teaspoon -- Pudding Cup -- Honey Teaspoon -- Honey Cup -- Nectar Teaspoon -- Nectar Cup -- Nectar Straw -- Thin Teaspoon -- Thin Cup -- Thin Straw -- Puree -- Mechanical Soft -- Regular -- Multi-consistency -- Pill -- Cervical Esophageal Comment -- [name] 12/19/2019, 9:56 AM               PMH:   Past Medical History:  Diagnosis Date  . Anemia   . Arthritis   . Celiac disease   . Chronic systolic CHF (congestive heart failure) ([place])   . Complete heart block ([place])   . Coronary artery disease   . Diabetes mellitus    INSULIN DEPENDENT  . GERD (gastroesophageal reflux disease)   . Glaucoma   . Headache(784.0)   . Heart disease   .  Hypercholesterolemia   . Hypertension   . Iron deficiency anemia, unspecified   . Ischemic cardiomyopathy   . Memory difficulties 04/14/2019  . Shortness of breath   . Sleep apnea    uses cpap  . Stroke ([place])   . Unspecified constipation     PSH:   Past Surgical History:  Procedure Laterality Date  . BACK SURGERY    . BI-VENTRICULAR PACEMAKER INSERTION (CRT-P)  March 2014   [facility]  . BIV PACEMAKER GENERATOR CHANGEOUT N/A 06/29/2019   Procedure: BIV PACEMAKER GENERATOR CHANGEOUT;  Surgeon: Evans Lance, MD;  Location: [facility];  Service: Cardiovascular;  Laterality: N/A;  . CARDIAC CATHETERIZATION  09/02/2012  . CARDIAC SURGERY    . CORONARY ANGIOPLASTY WITH STENT PLACEMENT  09/02/2012   RCA  . PERCUTANEOUS CORONARY STENT INTERVENTION (PCI-S) N/A 09/02/2012   Procedure: PERCUTANEOUS CORONARY STENT INTERVENTION (PCI-S);  Surgeon: Clent Demark, MD;  Location: [facility] CATH LAB;  Service: Cardiovascular;  Laterality: N/A;  . PERMANENT PACEMAKER INSERTION N/A 11/29/2012   Procedure: PERMANENT PACEMAKER INSERTION;  Surgeon: Deboraha Sprang, MD;  Location: [facility] CATH LAB;  Service: Cardiovascular;  Laterality: N/A;  . TEMPORARY PACEMAKER INSERTION N/A 11/28/2012   Procedure: TEMPORARY PACEMAKER INSERTION;  Surgeon: Clent Demark, MD;  Location: [facility] CATH LAB;  Service: Cardiovascular;  Laterality: N/A;    Allergies: No Known Allergies  Medications:   Prior to Admission medications   Medication Sig Start Date End Date Taking? Authorizing Provider  amLODipine (NORVASC) 2.5 MG tablet Take 2.5 mg by mouth every morning.    [provider]  aspirin EC 81 MG EC tablet Take 1 tablet (81 mg total) by mouth daily. 12/15/19   Charlynne Cousins, MD  atorvastatin (LIPITOR) 80 MG tablet Take 80 mg by mouth every morning.  04/17/15   [provider]  brimonidine-timolol (COMBIGAN) 0.2-0.5 % ophthalmic solution Place 1 drop into both eyes 2 (two) times daily.     [provider]  calcitRIOL (ROCALTROL) 0.25 MCG capsule Take 0.25 mcg by mouth every morning.    [provider]  carvedilol (COREG) 3.125 MG tablet  Take 3.125 mg by mouth 2 (two) times daily. 04/17/15   [provider]  clopidogrel (PLAVIX) 75 MG tablet Take 1 tablet (75 mg total) by mouth daily. 07/03/19   Thurnell Lose, MD  Cod Liver Oil CAPS Take 1 capsule by mouth at bedtime.     [provider]  donepezil (ARICEPT) 10 MG tablet Take 10 mg by mouth at bedtime.  11/07/19   [provider]  dorzolamide (TRUSOPT) 2 % ophthalmic solution Place 1 drop into both eyes 2 (two) times daily.  07/28/13   [provider]  ferrous sulfate 325 (65 FE) MG tablet Take 325 mg by mouth daily with breakfast.    [provider]  insulin degludec (TRESIBA FLEXTOUCH) 100 UNIT/ML SOPN FlexTouch Pen Inject 10-20 Units into the skin See admin instructions. Inject 10-20 units into the skin at bedtime, based upon CGB (home capillary blood glucose)    [provider]  Insulin Lispro (HUMALOG KWIKPEN) 200 UNIT/ML SOPN Inject 6 Units into the skin 3 (three) times daily as needed (high blood sugar).     [provider]  latanoprost (XALATAN) 0.005 % ophthalmic solution Appointment OVERDUE, place 1 drop into both eyes daily at bedtime Patient taking differently: Place 1 drop into both eyes at bedtime.  03/02/14   Blanchie Serve, MD  linagliptin (TRADJENTA) 5 MG TABS tablet Take 5 mg by mouth every morning.    [provider]  loperamide (IMODIUM) 2 MG capsule Take 2 mg by mouth as needed for diarrhea or loose stools.    [provider]  Multiple Vitamin (MULTIVITAMIN WITH MINERALS) TABS Take 1 tablet by mouth at bedtime.     [provider]  Multiple Vitamins-Minerals (EYE VITAMINS) CAPS Take 1 capsule by mouth every morning.    [provider]  nitroGLYCERIN (NITROSTAT) 0.4 MG SL tablet Place 0.4 mg under the  tongue every 5 (five) minutes as needed for chest pain.  09/03/12   Charolette Forward, MD  NOVOFINE 32G X 6 MM MISC  01/25/13   [provider]  pantoprazole (PROTONIX) 40 MG tablet Take 40 mg by mouth daily before breakfast.  05/07/15   [provider]  valsartan-hydrochlorothiazide (DIOVAN-HCT) 320-12.5 MG tablet Take 1 tablet by mouth every morning.     [provider]    Discontinued Meds:   Medications Discontinued During This Encounter  Medication Reason  . enoxaparin (LOVENOX) injection 30 mg Duplicate  . meropenem (MERREM) 1 g in sodium chloride 0.9 % 100 mL IVPB   . dorzolamide (TRUSOPT) 2 % ophthalmic solution 1 drop   . latanoprost (XALATAN) 0.005 % ophthalmic solution 1 drop   . insulin glargine (LANTUS) injection 10 Units   . cephALEXin (KEFLEX) capsule 500 mg   . insulin aspart (novoLOG) injection 2 Units   . insulin glargine (LANTUS) injection 12 Units   . 0.45 % sodium chloride infusion   . calcitRIOL (ROCALTROL) capsule 0.25 mcg   . insulin aspart (novoLOG) injection 0-9 Units     Social History:  reports that she quit smoking about 43 years ago. She has never used smokeless tobacco. She reports that she does not drink alcohol or use drugs.  Family History:   Family History  Problem Relation Age of Onset  . Diabetes Mother   . Hypertension Mother   . Hyperlipidemia Mother   . Cancer Sister   . Dementia Sister   . Neuropathy Sister     Blood pressure (!) 186/65, pulse  70, temperature 98.6 F (37 C), temperature source Oral, resp. rate 16, height 5' 7"  (1.702 m), weight 78.7 kg, SpO2 93 %. General appearance: alert and cooperative Head: Normocephalic, without obvious abnormality, atraumatic Eyes: negative Back: symmetric, no curvature. ROM normal. No CVA tenderness. Resp: diminished breath sounds bibasilar and bilaterally Chest wall: no tenderness Cardio: S1, S2 normal GI: soft, non-tender; bowel sounds normal; no masses,  no  organomegaly Extremities: edema none Pulses: 2+ and symmetric Skin: Skin color, texture, turgor normal. No rashes or lesions Lymph nodes: Cervical adenopathy: none       Eulan Heyward, Hunt Oris, MD 12/19/2019, 11:59 AM

## 2019-12-19 NOTE — Consult Note (Signed)
Medical Consultation   [name]  POE:[id number]  DOB: [birth year]  DOA: 12/14/2019  PCP: [name], MD (Confirm with patient/family/NH records and if not entered, this has to be entered at [facility] point of entry)    Requesting physician: Lauraine Rinne, PA-C  Reason for consultation: Medical management  History of Present Illness: [name] is an 86 y.o. female with past medical history significant for hypertension, heart block s/p pacemaker, mild cognitive impairment, DM type II, OSA on CPAP, CAD s/p stenting, and CVA.  She had recently been hospitalized from 2/16-2/24, with right-sided hemiparesis thought likely due to a stroke.  Patient was unable to have MRI due to pacemaker..  During her hospitalization urine culture grew out Citrobacter and E. coli which was pansensitive and she was switched from meropenem to Keflex.  She was transferred to inpatient rehab to continue therapies.  On 2/27 she was downgraded from dysphagia 2 diet to a dysphagia 1 diet, and on 2/28 made n.p.o. due to difficulty in handling secretions.  Current plan is for placement of NGT tube.  Patient was switched to Rocephin to complete course of antibiotic.  Labs significant for WBC 10.7, hemoglobin 10.8 sodium 154, BUN 32, creatinine 1.52, and calcium 13.4     Review of Systems:  Unable to obtain due to patient's limited ability to communicate at this time   Past Medical History: Past Medical History:  Diagnosis Date  . Anemia   . Arthritis   . Celiac disease   . Chronic systolic CHF (congestive heart failure) ([place])   . Complete heart block ([place])   . Coronary artery disease   . Diabetes mellitus    INSULIN DEPENDENT  . GERD (gastroesophageal reflux disease)   . Glaucoma   . Headache(784.0)   . Heart disease   . Hypercholesterolemia   . Hypertension   . Iron deficiency anemia, unspecified   . Ischemic cardiomyopathy   . Memory difficulties 04/14/2019  . Shortness of  breath   . Sleep apnea    uses cpap  . Stroke ([place])   . Unspecified constipation     Past Surgical History: Past Surgical History:  Procedure Laterality Date  . BACK SURGERY    . BI-VENTRICULAR PACEMAKER INSERTION (CRT-P)  March 2014   [facility]  . BIV PACEMAKER GENERATOR CHANGEOUT N/A 06/29/2019   Procedure: BIV PACEMAKER GENERATOR CHANGEOUT;  Surgeon: Evans Lance, MD;  Location: [place] [facility];  Service: Cardiovascular;  Laterality: N/A;  . CARDIAC CATHETERIZATION  09/02/2012  . CARDIAC SURGERY    . CORONARY ANGIOPLASTY WITH STENT PLACEMENT  09/02/2012   RCA  . PERCUTANEOUS CORONARY STENT INTERVENTION (PCI-S) N/A 09/02/2012   Procedure: PERCUTANEOUS CORONARY STENT INTERVENTION (PCI-S);  Surgeon: Clent Demark, MD;  Location: [facility] CATH LAB;  Service: Cardiovascular;  Laterality: N/A;  . PERMANENT PACEMAKER INSERTION N/A 11/29/2012   Procedure: PERMANENT PACEMAKER INSERTION;  Surgeon: Deboraha Sprang, MD;  Location: [facility] CATH LAB;  Service: Cardiovascular;  Laterality: N/A;  . TEMPORARY PACEMAKER INSERTION N/A 11/28/2012   Procedure: TEMPORARY PACEMAKER INSERTION;  Surgeon: Clent Demark, MD;  Location: [facility] CATH LAB;  Service: Cardiovascular;  Laterality: N/A;     Allergies:  No Known Allergies   Social History:  reports that she quit smoking about 43 years ago. She has never used smokeless tobacco. She reports that she does not drink alcohol or use drugs.  Family History: Family History  Problem Relation Age of Onset  . Diabetes Mother   . Hypertension Mother   . Hyperlipidemia Mother   . Cancer Sister   . Dementia Sister   . Neuropathy Sister     Unacceptable: Noncontributory, unremarkable, or negative. Acceptable: Family history reviewed and not pertinent (If you reviewed it)   Physical Exam: Vitals:   12/18/19 1936 12/18/19 2024 12/19/19 0324 12/19/19 1026  BP: (!) 180/65  (!) 167/61 (!) 186/65  Pulse: 70 70 80 70  Resp: 16 18 16    Temp: 98.2 F  (36.8 C)  98.7 F (37.1 C)   TempSrc: Oral  Oral   SpO2: 93% 93% 96% 93%  Weight:      Height:        Constitutional: Appearance,  Alert and awake, oriented x3, not in any acute distress. Eyes: PERLA, EOMI, irises appear normal, anicteric sclera,  ENMT: external ears and nose appear normal, normal hearing or hard of hearing            Lips appears normal, oropharynx mucosa, tongue, posterior pharynx appear normal  Neck: neck appears normal, no masses, normal ROM, no thyromegaly, no JVD  CVS: S1-S2 clear, no murmur rubs or gallops, no LE edema, normal pedal pulses  Respiratory:  clear to auscultation bilaterally, no wheezing, rales or rhonchi. Respiratory effort normal. No accessory muscle use.  Abdomen: soft nontender, nondistended, normal bowel sounds, no hepatosplenomegaly, no hernias  Musculoskeletal: : no cyanosis, clubbing or edema noted bilaterally                       Joint/bones/muscle exam, strength, contractures or atrophy Neuro: Cranial nerves II-XII intact, strength, sensation, reflexes Psych: judgement and insight appear normal, stable mood and affect, mental status Skin: no rashes or lesions or ulcers, no induration or nodules   (Anything < 9 systems with 2 bullets each down codes to level 1) (If patient refuses exam can't bill higher level) (Make sure to document decubitus ulcers present on admission -- if possible -- and whether patient has chronic indwelling catheter at time of admission)  Data reviewed:  I have personally reviewed following labs and imaging studies Labs:  CBC: Recent Labs  Lab 12/13/19 0338 12/14/19 0552 12/15/19 0643 12/18/19 0951 12/19/19 0434  WBC 12.6* 10.7* 10.2 11.3* 10.7*  NEUTROABS 9.1* 7.5 7.3  --   --   HGB 10.1* 10.1* 10.0* 12.3 10.8*  HCT 32.2* 32.3* 32.4* 40.3 36.0  MCV 91.7 92.3 93.4 95.5 97.0  PLT 214 212 231 279 094    Basic Metabolic Panel: Recent Labs  Lab 12/13/19 0338 12/13/19 0338 12/14/19 0552 12/14/19 0552  12/15/19 0643 12/15/19 0643 12/18/19 0951 12/19/19 0434  NA 137  --  142  --  141  --  151* 154*  K 4.5   < > 4.5   < > 4.4   < > 4.0 3.6  CL 102  --  105  --  105  --  114* 115*  CO2 27  --  29  --  28  --  31 31  GLUCOSE 230*  --  232*  --  283*  --  207* 148*  BUN 21  --  27*  --  30*  --  31* 32*  CREATININE 1.21*  --  1.37*  --  1.26*  --  1.52* 1.52*  CALCIUM 11.4*  --  12.2*  --  12.2*  --  13.7* 13.4*   < > =  values in this interval not displayed.   GFR Estimated Creatinine Clearance: 27.1 mL/min (A) (by C-G formula based on SCr of 1.52 mg/dL (H)). Liver Function Tests: Recent Labs  Lab 12/15/19 0643 12/18/19 0951  AST 38 27  ALT 33 27  ALKPHOS 61 69  BILITOT 0.2* 0.6  PROT 6.5 6.9  ALBUMIN 3.1* 3.2*   No results for input(s): LIPASE, AMYLASE in the last 168 hours. No results for input(s): AMMONIA in the last 168 hours. Coagulation profile No results for input(s): INR, PROTIME in the last 168 hours.  Cardiac Enzymes: No results for input(s): CKTOTAL, CKMB, CKMBINDEX, TROPONINI in the last 168 hours. BNP: Invalid input(s): POCBNP CBG: Recent Labs  Lab 12/18/19 0616 12/18/19 1158 12/18/19 1606 12/18/19 2103 12/19/19 0626  GLUCAP 130* 163* 139* 119* 128*   D-Dimer No results for input(s): DDIMER in the last 72 hours. Hgb A1c No results for input(s): HGBA1C in the last 72 hours. Lipid Profile No results for input(s): CHOL, HDL, LDLCALC, TRIG, CHOLHDL, LDLDIRECT in the last 72 hours. Thyroid function studies No results for input(s): TSH, T4TOTAL, T3FREE, THYROIDAB in the last 72 hours.  Invalid input(s): FREET3 Anemia work up No results for input(s): VITAMINB12, FOLATE, FERRITIN, TIBC, IRON, RETICCTPCT in the last 72 hours. Urinalysis    Component Value Date/Time   COLORURINE YELLOW 12/12/2019 1030   APPEARANCEUR HAZY (A) 12/12/2019 1030   LABSPEC 1.015 12/12/2019 1030   PHURINE 6.0 12/12/2019 1030   GLUCOSEU NEGATIVE 12/12/2019 1030   HGBUR  NEGATIVE 12/12/2019 1030   BILIRUBINUR NEGATIVE 12/12/2019 1030   KETONESUR NEGATIVE 12/12/2019 1030   PROTEINUR NEGATIVE 12/12/2019 1030   UROBILINOGEN 0.2 08/07/2015 1826   NITRITE NEGATIVE 12/12/2019 1030   LEUKOCYTESUR SMALL (A) 12/12/2019 1030     Microbiology Recent Results (from the past 240 hour(s))  Culture, Urine     Status: Abnormal   Collection Time: 12/12/19  7:13 AM   Specimen: Urine, Random  Result Value Ref Range Status   Specimen Description URINE, RANDOM  Final   Special Requests   Final    NONE Performed at [facility], [place] [address], [place], [place] [address]    Culture (A)  Final    >=100,000 COLONIES/mL CITROBACTER KOSERI 50,000 COLONIES/mL ESCHERICHIA COLI    Report Status 12/15/2019 FINAL  Final   Organism ID, Bacteria CITROBACTER KOSERI (A)  Final   Organism ID, Bacteria ESCHERICHIA COLI (A)  Final      Susceptibility   Citrobacter koseri - MIC*    CEFAZOLIN <=4 SENSITIVE Sensitive     CEFTRIAXONE <=0.25 SENSITIVE Sensitive     CIPROFLOXACIN <=0.25 SENSITIVE Sensitive     GENTAMICIN <=1 SENSITIVE Sensitive     IMIPENEM <=0.25 SENSITIVE Sensitive     NITROFURANTOIN 32 SENSITIVE Sensitive     TRIMETH/SULFA <=20 SENSITIVE Sensitive     PIP/TAZO <=4 SENSITIVE Sensitive     * >=100,000 COLONIES/mL CITROBACTER KOSERI   Escherichia coli - MIC*    AMPICILLIN <=2 SENSITIVE Sensitive     CEFAZOLIN <=4 SENSITIVE Sensitive     CEFTRIAXONE <=0.25 SENSITIVE Sensitive     CIPROFLOXACIN <=0.25 SENSITIVE Sensitive     GENTAMICIN <=1 SENSITIVE Sensitive     IMIPENEM <=0.25 SENSITIVE Sensitive     NITROFURANTOIN <=16 SENSITIVE Sensitive     TRIMETH/SULFA <=20 SENSITIVE Sensitive     AMPICILLIN/SULBACTAM <=2 SENSITIVE Sensitive     PIP/TAZO <=4 SENSITIVE Sensitive     * 50,000 COLONIES/mL ESCHERICHIA COLI  Inpatient Medications:   Scheduled Meds: . amLODipine  2.5 mg Oral Daily  . aspirin EC  81 mg Oral Daily  . atorvastatin  80 mg Oral q  morning - 10a  . brimonidine  1 drop Both Eyes BID  . calcitRIOL  0.25 mcg Oral Daily  . carvedilol  3.125 mg Oral BID  . clopidogrel  75 mg Oral Daily  . donepezil  10 mg Oral QHS  . dorzolamide  1 drop Both Eyes TID  . enoxaparin (LOVENOX) injection  30 mg Subcutaneous Daily  . ferrous sulfate  325 mg Oral Q breakfast  . insulin aspart  0-9 Units Subcutaneous TID WC  . insulin glargine  6 Units Subcutaneous BID  . latanoprost  1 drop Both Eyes QHS  . pantoprazole  40 mg Oral QAC breakfast  . polyethylene glycol  17 g Oral BID  . senna-docusate  1 tablet Oral BID  . sodium chloride flush  10-40 mL Intracatheter Q12H   Continuous Infusions: . cefTRIAXone (ROCEPHIN)  IV 1 g (12/19/19 1020)  . dextrose       Radiological Exams on Admission: DG Swallowing Func-Speech Pathology  Result Date: 12/19/2019 Objective Swallowing Evaluation: Type of Study: MBS-Modified Barium Swallow Study  Patient Details Name: [name] MRN: [id number] Date of Birth: [birth year] Today's Date: 12/19/2019 Time: SLP Start Time (ACUTE ONLY): 0901 -SLP Stop Time (ACUTE ONLY): 0920 SLP Time Calculation (min) ): 19 min Past Medical History: Past Medical History: Diagnosis Date . Anemia  . Arthritis  . Celiac disease  . Chronic systolic CHF (congestive heart failure) ([place])  . Complete heart block ([place])  . Coronary artery disease  . Diabetes mellitus   INSULIN DEPENDENT . GERD (gastroesophageal reflux disease)  . Glaucoma  . Headache(784.0)  . Heart disease  . Hypercholesterolemia  . Hypertension  . Iron deficiency anemia, unspecified  . Ischemic cardiomyopathy  . Memory difficulties 04/14/2019 . Shortness of breath  . Sleep apnea   uses cpap . Stroke ([place])  . Unspecified constipation  Past Surgical History: Past Surgical History: Procedure Laterality Date . BACK SURGERY   . BI-VENTRICULAR PACEMAKER INSERTION (CRT-P)  March 2014  [facility] . BIV PACEMAKER GENERATOR CHANGEOUT N/A 06/29/2019  Procedure: BIV PACEMAKER  GENERATOR CHANGEOUT;  Surgeon: Evans Lance, MD;  Location: [facility] LAB;  Service: Cardiovascular;  Laterality: N/A; . CARDIAC CATHETERIZATION  09/02/2012 . CARDIAC SURGERY   . CORONARY ANGIOPLASTY WITH STENT PLACEMENT  09/02/2012  RCA . PERCUTANEOUS CORONARY STENT INTERVENTION (PCI-S) N/A 09/02/2012  Procedure: PERCUTANEOUS CORONARY STENT INTERVENTION (PCI-S);  Surgeon: Clent Demark, MD;  Location: [facility] CATH LAB;  Service: Cardiovascular;  Laterality: N/A; . PERMANENT PACEMAKER INSERTION N/A 11/29/2012  Procedure: PERMANENT PACEMAKER INSERTION;  Surgeon: Deboraha Sprang, MD;  Location: [facility] CATH LAB;  Service: Cardiovascular;  Laterality: N/A; . TEMPORARY PACEMAKER INSERTION N/A 11/28/2012  Procedure: TEMPORARY PACEMAKER INSERTION;  Surgeon: Clent Demark, MD;  Location: [facility] CATH LAB;  Service: Cardiovascular;  Laterality: N/A; HPI: Pt is a 86 y.o. F with significant PMH of CHF, DM, CAD with pacemaker, HTN, prior stroke, who presents with several falls in the prior 24 hours and RLE weakness. CT negative for acute abnormality. Pt unable to receive MRI due to pacemaker. Pt admitted to [facility] 12/14/19.  Assessment / Plan / Recommendation CHL IP CLINICAL IMPRESSIONS 12/19/2019 Clinical Impression Pt presents with severe oropharyngeal dysphagia and is unsafe for PO intake at this time. Pt was awake and focused attention was, but  she was only able to trigger volitional swallow on command X1 during barium trials across various textures ranging from thin to puree. Pt exhibited severe oral holding of small puree, nectar, and thin boluses. When bolus size increased by clinician in attempt to increase sensory input, nectar barium spilled anteriorly out of oral cavity as well as prematurely into pharynx with very little intentional oral manipulation of bolus by pt and she was unable to follow commands to trigger volitional swallow. Only 1 reflexive swallow of thin barium was captured on imaging due to pt's decreased ability to  follow commands during PO presentations, during which no aspiration or penetration was observed. However, during subsequent imaging, a trace amount of barium was visualized in laryngeal vestibule, which SLP suspects entered airway after the swallow, as a result of either lingual/palatal or vallecular residue. Suctioning was required during 100% PO presentations throughout study today. Given severity of pt's oral phase deficits and reduced ability to follow commands, PO intake is inefficient and unsafe at this time. Recommend pt continue NPO and medical team may wish to pursue alternative means of nutrition and medication administration. ST will continue to provide skilled interventions to work toward readiness for repeat MBSS to assess potential for diet advancement. SLP Visit Diagnosis Dysphagia, oropharyngeal phase (R13.12) Attention and concentration deficit following -- Frontal lobe and executive function deficit following -- Impact on safety and function Severe aspiration risk   CHL IP TREATMENT RECOMMENDATION 12/08/2019 Treatment Recommendations Therapy as outlined in treatment plan below   Prognosis 12/08/2019 Prognosis for Safe Diet Advancement Fair Barriers to Reach Goals Cognitive deficits Barriers/Prognosis Comment -- CHL IP DIET RECOMMENDATION 12/19/2019 SLP Diet Recommendations NPO;Alternative means - temporary;Alternative means - long-term Liquid Administration via -- Medication Administration Via alternative means Compensations -- Postural Changes --   CHL IP OTHER RECOMMENDATIONS 12/19/2019 Recommended Consults -- Oral Care Recommendations Oral care QID Other Recommendations --   CHL IP FOLLOW UP RECOMMENDATIONS 12/13/2019 Follow up Recommendations 24 hour supervision/assistance;Skilled Nursing facility;Inpatient Rehab   CHL IP FREQUENCY AND DURATION 12/08/2019 Speech Therapy Frequency (ACUTE ONLY) min 2x/week Treatment Duration 2 weeks      CHL IP ORAL PHASE 12/19/2019 Oral Phase Impaired Oral - Pudding  Teaspoon -- Oral - Pudding Cup -- Oral - Honey Teaspoon -- Oral - Honey Cup -- Oral - Nectar Teaspoon -- Oral - Nectar Cup Left anterior bolus loss;Right anterior bolus loss;Weak lingual manipulation;Lingual pumping;Incomplete tongue to palate contact;Reduced posterior propulsion;Holding of bolus;Delayed oral transit;Decreased bolus cohesion;Premature spillage;Lingual/palatal residue Oral - Nectar Straw -- Oral - Thin Teaspoon -- Oral - Thin Cup Left anterior bolus loss;Right anterior bolus loss;Weak lingual manipulation;Reduced posterior propulsion;Decreased bolus cohesion;Holding of bolus;Delayed oral transit;Lingual/palatal residue Oral - Thin Straw NT Oral - Puree Weak lingual manipulation;Decreased bolus cohesion;Delayed oral transit;Reduced posterior propulsion;Holding of bolus;Lingual pumping Oral - Mech Soft -- Oral - Regular -- Oral - Multi-Consistency -- Oral - Pill -- Oral Phase - Comment --  CHL IP PHARYNGEAL PHASE 12/19/2019 Pharyngeal Phase Impaired Pharyngeal- Pudding Teaspoon -- Pharyngeal -- Pharyngeal- Pudding Cup -- Pharyngeal -- Pharyngeal- Honey Teaspoon -- Pharyngeal -- Pharyngeal- Honey Cup -- Pharyngeal -- Pharyngeal- Nectar Teaspoon -- Pharyngeal -- Pharyngeal- Nectar Cup Penetration/Apiration after swallow Pharyngeal Material enters airway, CONTACTS cords and not ejected out Pharyngeal- Nectar Straw -- Pharyngeal -- Pharyngeal- Thin Teaspoon -- Pharyngeal -- Pharyngeal- Thin Cup Reduced anterior laryngeal mobility;Reduced laryngeal elevation;Delayed swallow initiation-pyriform sinuses Pharyngeal -- Pharyngeal- Thin Straw -- Pharyngeal -- Pharyngeal- Puree -- Pharyngeal -- Pharyngeal- Mechanical Soft -- Pharyngeal --  Pharyngeal- Regular -- Pharyngeal -- Pharyngeal- Multi-consistency -- Pharyngeal -- Pharyngeal- Pill -- Pharyngeal -- Pharyngeal Comment --  CHL IP CERVICAL ESOPHAGEAL PHASE 12/19/2019 Cervical Esophageal Phase WFL Pudding Teaspoon -- Pudding Cup -- Honey Teaspoon -- Honey Cup --  Nectar Teaspoon -- Nectar Cup -- Nectar Straw -- Thin Teaspoon -- Thin Cup -- Thin Straw -- Puree -- Mechanical Soft -- Regular -- Multi-consistency -- Pill -- Cervical Esophageal Comment -- [name] 12/19/2019, 9:56 AM               Impression/Recommendations Right-sided hemiparesis secondary to cerebrovascular accident: Patient admitted after being found to have right-sided paresis.  Unable to have MRI due to compatibility with pacemaker.  Due to worsening dysphagia patient was made NPO.  Current plan is to place NG tube and consulting nutrition for tube feed recommendations.  Patient is receiving PT/OT/speech. -Per PM&R    Hypercalcemia: Acute on chronic.  Calcium level elevated up to 13.4 today, but had been as high as 13.7 yesterday. Review of records shows that the patient calcium levels had previously been elevated dating back to 2015.  Question the possibility of underlying malignancy patient does not appear to be on any medications currently and that could be elevating levels, but has had worsening kidney function. -Check PTH, PTH -related peptide, and vitamin D levels -Continue to monitor calcium level  Hypernatremia: Acute.  Recheck of sodium 154 on admission today.  Patient has been placed on IV fluids of 0.45% normal saline at 75 mL/h. -Stop 0.45% normal saline -D5 W IV fluids at 100 mL/h  -Recommend switching to free water flushes once NG tube is placed and placement is confirmed  Acute kidney injury superimposed on chronic kidney disease stage IIIb: Patient baseline creatinine previously noted to be around 1.2, but currently creatinine 1.52 with BUN 32.  The elevated BUN to creatinine ratio suggest prerenal cause of symptoms.  -Strict intake and output -Check urine sodium and urine creatinine -IV fluids as seen above   Leukocytosis: Acute.  WBC 10.7 which appears to be trending down.  Patient currently on antibiotics for treatments of UTI. -Continue to monitor  Urinary  tract infection: Patient was noted to be positive for E. coli and Citrobacter.  Initially had been put on meropenem, but was later switched to Keflex.  As she is currently n.p.o. has been transitioned to Rocephin to complete course. -Per PM&R   Systolic congestive heart failure: Last echo noted EF noted to be 50-55% on 12/06/2018.   Patient does not appear to be fluid overloaded at this time. -Daily weights  Anemia of chronic disease: Hemoglobin 10.8 which appears near her baseline. -Continue to monitor  Diabetes mellitus type 2: Hemoglobin A1c noted to be 8.6.  Patient had been on CBGs q. before meals with sensitive SSI -Hypoglycemic protocol -Change CBG to every 6 hours with sensitive SSI -May need to place on moderate sliding scale if levels continue to be elevated  Thank you for this consultation.  Our [facility] hospitalist team will follow the patient with you.    Time Spent: 30 minutes  Norval Morton M.D. [facility] 12/19/2019, 10:27 AM

## 2019-12-19 NOTE — Progress Notes (Addendum)
Modified Barium Swallow Progress Note  Patient Details  Name: [name] MRN: [id number] Date of Birth: 10/14/1928  Today's Date: 12/19/2019  Modified Barium Swallow completed.  Full report located under Chart Review in the Imaging Section.  Brief recommendations include the following:  Clinical Impression Pt presents with severe oropharyngeal dysphagia and is unsafe for PO intake at this time. Pt was awake and focused attention was achieved, but she was only able to trigger volitional swallow on command X1 during barium trials across various textures ranging from thin to puree. Pt exhibited severe oral holding of small puree, nectar, and thin boluses. When bolus size increased by clinician in attempt to increase sensory input, nectar barium spilled anteriorly out of oral cavity as well as prematurely into pharynx with very little intentional oral manipulation of bolus by pt and she was unable to follow commands to trigger volitional swallow. Only 1 reflexive swallow of thin barium was captured on imaging due to pt's decreased ability to follow commands during PO presentations, during which no aspiration or penetration was observed. However, during subsequent imaging, a trace amount of barium was visualized in laryngeal vestibule, which SLP suspects entered airway after the swallow, as a result of either lingual/palatal or vallecular residue. Suctioning was required during 100% PO presentations throughout study today. Given severity of pt's oral phase deficits and reduced ability to follow commands, PO intake is inefficient and unsafe at this time. Recommend pt continue NPO and medical team may wish to pursue alternative means of nutrition and medication administration. ST will continue to provide skilled interventions to work toward readiness for repeat MBSS to assess potential for diet advancement.       Swallow Evaluation Recommendations       SLP Diet Recommendations: NPO;Alternative  means - temporary;Alternative means - long-term       Medication Administration: Via alternative means               Oral Care Recommendations: Oral care QID        Arbutus Leas 12/19/2019,9:56 AM

## 2019-12-19 NOTE — Progress Notes (Signed)
No CPAP will be placed tonight. Patient has NGT in nose.

## 2019-12-19 NOTE — Progress Notes (Signed)
Writer advanced the NG tube after it could not be seen on the KUB. Second KUB showed NG tube to be in stomach, this was further verified by injecting air thru the tube. Air sounds were heard via stethescope held over the stomach.

## 2019-12-19 NOTE — Progress Notes (Signed)
Critical lab of calcium reported to [place]. Calcium levels at 13.4 Adria Devon, LPN

## 2019-12-19 NOTE — Progress Notes (Signed)
Speech Language Pathology Daily Session Note  Patient Details  Name: [name] MRN: [id number] Date of Birth: [birth year]  Today's Date: 12/19/2019 SLP Individual Time: 0488-8916 SLP Individual Time Calculation (min): 28 min  Short Term Goals: Week 1: SLP Short Term Goal 1 (Week 1): Pt will consume current diet with minimal overt s/sx aspiration and demonstrate efficient mastication and oral clearance with timely AP transit of boluses with Mod A cues. SLP Short Term Goal 2 (Week 1): Pt will sustain attention to tasks for 15 minutes with Mod A verbal/visual cues. SLP Short Term Goal 3 (Week 1): Pt will demonstrate ability to problem solve during basic functional tasks with Max A verbal/visual cues. SLP Short Term Goal 4 (Week 1): Pt will demonstrate awareness of errors during functional tasks with Max A multimodal cues. SLP Short Term Goal 5 (Week 1): Pt will increase speech intelligibility to 75% at the phrase level with Mod A verbal/visual cues for use of increased vocal intensity and overarticulation.  Skilled Therapeutic Interventions: Pt was seen for skilled ST targeting education with pt and her daughter as well as cognitive interventions. SLP provided verbal review of pt's MBSS results this morning and severity of oral phase impairments impacting aspiration risk and safety of PO intake at this time. Also answered questions regarding wet/gurgly wet vocal quality and strategies to cue pt to clear pharyngeal congestion when able to follow commands. SLP provided demonstration and Max A verbal cues for clearing throat and coughing, which pt successfully imitated X3 during session in attempt to mobilize secretions, however unable to expectorate any secretions orally or via oral suction. Pt required binary choice cues in order to orient to place and month, and Mod A multimodal stimulation and cueing in order to maintain arousal throughout session for ~1-2 minute intervals. Pt left semi-reclined  in bed with alarm set and daughter still present. Continue per current plan of care.       Pain Pain Assessment Pain Scale: 0-10 Pain Score: 0-No pain  Therapy/Group: Individual Therapy  Arbutus Leas 12/19/2019, 1:27 PM

## 2019-12-20 ENCOUNTER — Inpatient Hospital Stay (HOSPITAL_COMMUNITY): Payer: Medicare Other | Admitting: *Deleted

## 2019-12-20 ENCOUNTER — Inpatient Hospital Stay (HOSPITAL_COMMUNITY): Payer: Medicare Other

## 2019-12-20 ENCOUNTER — Inpatient Hospital Stay (HOSPITAL_COMMUNITY): Payer: Medicare Other | Admitting: Speech Pathology

## 2019-12-20 ENCOUNTER — Inpatient Hospital Stay (HOSPITAL_COMMUNITY): Payer: Medicare Other | Admitting: Occupational Therapy

## 2019-12-20 LAB — PROTEIN ELECTROPHORESIS, SERUM
A/G Ratio: 0.8 (ref 0.7–1.7)
Albumin ELP: 2.9 g/dL (ref 2.9–4.4)
Alpha-1-Globulin: 0.2 g/dL (ref 0.0–0.4)
Alpha-2-Globulin: 0.9 g/dL (ref 0.4–1.0)
Beta Globulin: 1.1 g/dL (ref 0.7–1.3)
Gamma Globulin: 1.2 g/dL (ref 0.4–1.8)
Globulin, Total: 3.5 g/dL (ref 2.2–3.9)
M-Spike, %: 0.4 g/dL — ABNORMAL HIGH
Total Protein ELP: 6.4 g/dL (ref 6.0–8.5)

## 2019-12-20 LAB — BASIC METABOLIC PANEL
Anion gap: 6 (ref 5–15)
BUN: 26 mg/dL — ABNORMAL HIGH (ref 8–23)
CO2: 29 mmol/L (ref 22–32)
Calcium: 12.6 mg/dL — ABNORMAL HIGH (ref 8.9–10.3)
Chloride: 113 mmol/L — ABNORMAL HIGH (ref 98–111)
Creatinine, Ser: 1.33 mg/dL — ABNORMAL HIGH (ref 0.44–1.00)
GFR calc Af Amer: 41 mL/min — ABNORMAL LOW (ref >60)
GFR calc non Af Amer: 35 mL/min — ABNORMAL LOW (ref >60)
Glucose, Bld: 249 mg/dL — ABNORMAL HIGH (ref 70–99)
Potassium: 3.3 mmol/L — ABNORMAL LOW (ref 3.5–5.1)
Sodium: 148 mmol/L — ABNORMAL HIGH (ref 135–145)

## 2019-12-20 LAB — CBC WITH DIFFERENTIAL/PLATELET
Abs Immature Granulocytes: 0.05 10*3/uL (ref 0.00–0.07)
Basophils Absolute: 0 10*3/uL (ref 0.0–0.1)
Basophils Relative: 0 %
Eosinophils Absolute: 0.1 10*3/uL (ref 0.0–0.5)
Eosinophils Relative: 1 %
HCT: 35.3 % — ABNORMAL LOW (ref 36.0–46.0)
Hemoglobin: 10.5 g/dL — ABNORMAL LOW (ref 12.0–15.0)
Immature Granulocytes: 0 %
Lymphocytes Relative: 22 %
Lymphs Abs: 2.5 10*3/uL (ref 0.7–4.0)
MCH: 28.8 pg (ref 26.0–34.0)
MCHC: 29.7 g/dL — ABNORMAL LOW (ref 30.0–36.0)
MCV: 97 fL (ref 80.0–100.0)
Monocytes Absolute: 1.2 10*3/uL — ABNORMAL HIGH (ref 0.1–1.0)
Monocytes Relative: 10 %
Neutro Abs: 7.4 10*3/uL (ref 1.7–7.7)
Neutrophils Relative %: 67 %
Platelets: 241 10*3/uL (ref 150–400)
RBC: 3.64 MIL/uL — ABNORMAL LOW (ref 3.87–5.11)
RDW: 12.3 % (ref 11.5–15.5)
WBC: 11.1 10*3/uL — ABNORMAL HIGH (ref 4.0–10.5)
nRBC: 0 % (ref 0.0–0.2)

## 2019-12-20 LAB — GLUCOSE, CAPILLARY
Glucose-Capillary: 204 mg/dL — ABNORMAL HIGH (ref 70–99)
Glucose-Capillary: 228 mg/dL — ABNORMAL HIGH (ref 70–99)
Glucose-Capillary: 234 mg/dL — ABNORMAL HIGH (ref 70–99)
Glucose-Capillary: 253 mg/dL — ABNORMAL HIGH (ref 70–99)
Glucose-Capillary: 264 mg/dL — ABNORMAL HIGH (ref 70–99)

## 2019-12-20 LAB — CREATININE, URINE, RANDOM: Creatinine, Urine: 96.63 mg/dL

## 2019-12-20 LAB — NA AND K (SODIUM & POTASSIUM), RAND UR
Potassium Urine: 28 mmol/L
Sodium, Ur: 16 mmol/L

## 2019-12-20 LAB — PTH, INTACT AND CALCIUM
Calcium, Total (PTH): 13.8 mg/dL (ref 8.7–10.3)
PTH: 71 pg/mL — ABNORMAL HIGH (ref 15–65)

## 2019-12-20 MED ORDER — CARVEDILOL 3.125 MG PO TABS
3.1250 mg | ORAL_TABLET | Freq: Two times a day (BID) | ORAL | Status: DC
  Administered 2019-12-20 – 2020-01-10 (×42): 3.125 mg
  Filled 2019-12-20 (×41): qty 1

## 2019-12-20 MED ORDER — CLOPIDOGREL BISULFATE 75 MG PO TABS
75.0000 mg | ORAL_TABLET | Freq: Every day | ORAL | Status: DC
  Administered 2019-12-20 – 2019-12-29 (×10): 75 mg
  Filled 2019-12-20 (×9): qty 1

## 2019-12-20 MED ORDER — INSULIN ASPART 100 UNIT/ML ~~LOC~~ SOLN
0.0000 [IU] | SUBCUTANEOUS | Status: DC
  Administered 2019-12-20: 5 [IU] via SUBCUTANEOUS
  Administered 2019-12-20: 3 [IU] via SUBCUTANEOUS
  Administered 2019-12-21 (×2): 9 [IU] via SUBCUTANEOUS
  Administered 2019-12-21: 7 [IU] via SUBCUTANEOUS
  Administered 2019-12-21: 9 [IU] via SUBCUTANEOUS

## 2019-12-20 MED ORDER — FREE WATER
140.0000 mL | Freq: Four times a day (QID) | Status: DC
  Administered 2019-12-21 – 2020-01-03 (×54): 140 mL

## 2019-12-20 MED ORDER — ASPIRIN 81 MG PO CHEW
81.0000 mg | CHEWABLE_TABLET | Freq: Every day | ORAL | Status: DC
  Administered 2019-12-20 – 2020-01-10 (×21): 81 mg via JEJUNOSTOMY
  Filled 2019-12-20 (×21): qty 1

## 2019-12-20 MED ORDER — ATORVASTATIN CALCIUM 80 MG PO TABS
80.0000 mg | ORAL_TABLET | Freq: Every morning | ORAL | Status: DC
  Administered 2019-12-20 – 2020-01-09 (×21): 80 mg
  Filled 2019-12-20 (×21): qty 1

## 2019-12-20 MED ORDER — PANTOPRAZOLE SODIUM 40 MG PO PACK
40.0000 mg | PACK | Freq: Every day | ORAL | Status: DC
  Administered 2019-12-20 – 2020-01-10 (×20): 40 mg
  Filled 2019-12-20 (×12): qty 20

## 2019-12-20 MED ORDER — PRO-STAT SUGAR FREE PO LIQD
30.0000 mL | Freq: Every day | ORAL | Status: DC
  Administered 2019-12-20 – 2020-01-04 (×15): 30 mL
  Filled 2019-12-20 (×15): qty 30

## 2019-12-20 MED ORDER — DONEPEZIL HCL 10 MG PO TABS
10.0000 mg | ORAL_TABLET | Freq: Every day | ORAL | Status: DC
  Administered 2019-12-20 – 2020-01-09 (×21): 10 mg
  Filled 2019-12-20 (×21): qty 1

## 2019-12-20 MED ORDER — POLYETHYLENE GLYCOL 3350 17 G PO PACK
17.0000 g | PACK | Freq: Two times a day (BID) | ORAL | Status: DC
  Administered 2019-12-20 – 2020-01-10 (×28): 17 g
  Filled 2019-12-20 (×34): qty 1

## 2019-12-20 MED ORDER — OSMOLITE 1.2 CAL PO LIQD: 1000.0000 mL | ORAL | Status: DC

## 2019-12-20 MED ORDER — CINACALCET HCL 30 MG PO TABS
30.0000 mg | ORAL_TABLET | Freq: Two times a day (BID) | ORAL | Status: DC
  Administered 2019-12-21 – 2020-01-04 (×28): 30 mg via ORAL
  Filled 2019-12-20 (×31): qty 1

## 2019-12-20 MED ORDER — CHLORHEXIDINE GLUCONATE 0.12 % MT SOLN
15.0000 mL | Freq: Two times a day (BID) | OROMUCOSAL | Status: DC
  Administered 2019-12-20 – 2020-01-10 (×42): 15 mL via OROMUCOSAL
  Filled 2019-12-20 (×36): qty 15

## 2019-12-20 MED ORDER — ONDANSETRON HCL 4 MG PO TABS
4.0000 mg | ORAL_TABLET | Freq: Three times a day (TID) | ORAL | Status: DC | PRN
  Administered 2020-01-04 – 2020-01-05 (×2): 4 mg
  Filled 2019-12-20 (×3): qty 1

## 2019-12-20 MED ORDER — AMLODIPINE BESYLATE 2.5 MG PO TABS
2.5000 mg | ORAL_TABLET | Freq: Every day | ORAL | Status: DC
  Administered 2019-12-20 – 2020-01-08 (×19): 2.5 mg
  Filled 2019-12-20 (×18): qty 1

## 2019-12-20 MED ORDER — ORAL CARE MOUTH RINSE
15.0000 mL | Freq: Two times a day (BID) | OROMUCOSAL | Status: DC
  Administered 2019-12-20 – 2020-01-10 (×43): 15 mL via OROMUCOSAL

## 2019-12-20 MED ORDER — JEVITY 1.2 CAL PO LIQD
1000.0000 mL | ORAL | Status: DC
  Administered 2019-12-20 – 2019-12-27 (×8): 1000 mL
  Filled 2019-12-20 (×24): qty 1000

## 2019-12-20 MED ORDER — FERROUS SULFATE 300 (60 FE) MG/5ML PO SYRP
300.0000 mg | ORAL_SOLUTION | Freq: Every day | ORAL | Status: DC
  Administered 2019-12-20 – 2020-01-04 (×15): 300 mg via ORAL
  Filled 2019-12-20 (×19): qty 5

## 2019-12-20 MED ORDER — SENNOSIDES-DOCUSATE SODIUM 8.6-50 MG PO TABS
1.0000 | ORAL_TABLET | Freq: Two times a day (BID) | ORAL | Status: DC
  Administered 2019-12-20 – 2020-01-10 (×29): 1
  Filled 2019-12-20 (×33): qty 1

## 2019-12-20 NOTE — Progress Notes (Signed)
[place] PHYSICAL MEDICINE & REHABILITATION PROGRESS NOTE   Subjective/Complaints: Mrs. Loschiavo lying in bed with eyes closed. Opens eyes easily to conversation. Nods head in response to questions.  WBC 11.1, slightly increased, will continue to trend tomorrow.  Hgb slightly decreased.   ROS: pt nonverbal this AM- couldn't get ROS Objective:   DG Abd Portable 1V  Result Date: 12/19/2019 CLINICAL DATA:  86 year old female status post NG placement. EXAM: PORTABLE ABDOMEN - 1 VIEW COMPARISON:  Earlier radiograph dated 12/19/2019. FINDINGS: Partially visualized enteric tube with side-port in the proximal stomach and tip in the region of the gastric fundus. No additional interval change. IMPRESSION: Enteric tube with tip in the gastric fundus. Electronically Signed   By: Anner Crete M.D.   On: 12/19/2019 16:09   DG Abd Portable 1V  Result Date: 12/19/2019 CLINICAL DATA:  NG tube placement. EXAM: PORTABLE ABDOMEN - 1 VIEW COMPARISON:  12/13/2019 FINDINGS: There is no visible NG tube in the lower chest or in the abdomen.Bowel gas pattern is normal. Lung bases are clear. No acute bone abnormality. IMPRESSION: No visible NG tube in the lower chest or abdomen. Electronically Signed   By: Lorriane Shire M.D.   On: 12/19/2019 13:43   DG Swallowing Func-Speech Pathology  Result Date: 12/19/2019 Objective Swallowing Evaluation: Type of Study: MBS-Modified Barium Swallow Study  Patient Details Name: NORVELLA LOSCALZO MRN: [id number] Date of Birth: 1928-03-27 Today's Date: 12/19/2019 Time: SLP Start Time (ACUTE ONLY): 0901 -SLP Stop Time (ACUTE ONLY): 0920 SLP Time Calculation (min) ): 19 min Past Medical History: Past Medical History: Diagnosis Date . Anemia  . Arthritis  . Celiac disease  . Chronic systolic CHF (congestive heart failure) ([place])  . Complete heart block ([place])  . Coronary artery disease  . Diabetes mellitus   INSULIN DEPENDENT . GERD (gastroesophageal reflux disease)  . Glaucoma  .  Headache(784.0)  . Heart disease  . Hypercholesterolemia  . Hypertension  . Iron deficiency anemia, unspecified  . Ischemic cardiomyopathy  . Memory difficulties 04/14/2019 . Shortness of breath  . Sleep apnea   uses cpap . Stroke ([place])  . Unspecified constipation  Past Surgical History: Past Surgical History: Procedure Laterality Date . BACK SURGERY   . BI-VENTRICULAR PACEMAKER INSERTION (CRT-P)  March 2014  [facility] . BIV PACEMAKER GENERATOR CHANGEOUT N/A 06/29/2019  Procedure: BIV PACEMAKER GENERATOR CHANGEOUT;  Surgeon: Evans Lance, MD;  Location: [place] [facility] LAB;  Service: Cardiovascular;  Laterality: N/A; . CARDIAC CATHETERIZATION  09/02/2012 . CARDIAC SURGERY   . CORONARY ANGIOPLASTY WITH STENT PLACEMENT  09/02/2012  RCA . PERCUTANEOUS CORONARY STENT INTERVENTION (PCI-S) N/A 09/02/2012  Procedure: PERCUTANEOUS CORONARY STENT INTERVENTION (PCI-S);  Surgeon: Clent Demark, MD;  Location: [facility] CATH LAB;  Service: Cardiovascular;  Laterality: N/A; . PERMANENT PACEMAKER INSERTION N/A 11/29/2012  Procedure: PERMANENT PACEMAKER INSERTION;  Surgeon: Deboraha Sprang, MD;  Location: [facility] CATH LAB;  Service: Cardiovascular;  Laterality: N/A; . TEMPORARY PACEMAKER INSERTION N/A 11/28/2012  Procedure: TEMPORARY PACEMAKER INSERTION;  Surgeon: Clent Demark, MD;  Location: [place] CATH LAB;  Service: Cardiovascular;  Laterality: N/A; HPI: Pt is a 86 y.o. F with significant PMH of CHF, DM, CAD with pacemaker, HTN, prior stroke, who presents with several falls in the prior 24 hours and RLE weakness. CT negative for acute abnormality. Pt unable to receive MRI due to pacemaker. Pt admitted to [facility] 12/14/19.  Assessment / Plan / Recommendation CHL IP CLINICAL IMPRESSIONS 12/19/2019 Clinical Impression Pt presents with severe oropharyngeal  dysphagia and is unsafe for PO intake at this time. Pt was awake and focused attention was, but she was only able to trigger volitional swallow on command X1 during barium trials across various  textures ranging from thin to puree. Pt exhibited severe oral holding of small puree, nectar, and thin boluses. When bolus size increased by clinician in attempt to increase sensory input, nectar barium spilled anteriorly out of oral cavity as well as prematurely into pharynx with very little intentional oral manipulation of bolus by pt and she was unable to follow commands to trigger volitional swallow. Only 1 reflexive swallow of thin barium was captured on imaging due to pt's decreased ability to follow commands during PO presentations, during which no aspiration or penetration was observed. However, during subsequent imaging, a trace amount of barium was visualized in laryngeal vestibule, which SLP suspects entered airway after the swallow, as a result of either lingual/palatal or vallecular residue. Suctioning was required during 100% PO presentations throughout study today. Given severity of pt's oral phase deficits and reduced ability to follow commands, PO intake is inefficient and unsafe at this time. Recommend pt continue NPO and medical team may wish to pursue alternative means of nutrition and medication administration. ST will continue to provide skilled interventions to work toward readiness for repeat MBSS to assess potential for diet advancement. SLP Visit Diagnosis Dysphagia, oropharyngeal phase (R13.12) Attention and concentration deficit following -- Frontal lobe and executive function deficit following -- Impact on safety and function Severe aspiration risk   CHL IP TREATMENT RECOMMENDATION 12/08/2019 Treatment Recommendations Therapy as outlined in treatment plan below   Prognosis 12/08/2019 Prognosis for Safe Diet Advancement Fair Barriers to Reach Goals Cognitive deficits Barriers/Prognosis Comment -- CHL IP DIET RECOMMENDATION 12/19/2019 SLP Diet Recommendations NPO;Alternative means - temporary;Alternative means - long-term Liquid Administration via -- Medication Administration Via alternative  means Compensations -- Postural Changes --   CHL IP OTHER RECOMMENDATIONS 12/19/2019 Recommended Consults -- Oral Care Recommendations Oral care QID Other Recommendations --   CHL IP FOLLOW UP RECOMMENDATIONS 12/13/2019 Follow up Recommendations 24 hour supervision/assistance;Skilled Nursing facility;Inpatient Rehab   CHL IP FREQUENCY AND DURATION 12/08/2019 Speech Therapy Frequency (ACUTE ONLY) min 2x/week Treatment Duration 2 weeks      CHL IP ORAL PHASE 12/19/2019 Oral Phase Impaired Oral - Pudding Teaspoon -- Oral - Pudding Cup -- Oral - Honey Teaspoon -- Oral - Honey Cup -- Oral - Nectar Teaspoon -- Oral - Nectar Cup Left anterior bolus loss;Right anterior bolus loss;Weak lingual manipulation;Lingual pumping;Incomplete tongue to palate contact;Reduced posterior propulsion;Holding of bolus;Delayed oral transit;Decreased bolus cohesion;Premature spillage;Lingual/palatal residue Oral - Nectar Straw -- Oral - Thin Teaspoon -- Oral - Thin Cup Left anterior bolus loss;Right anterior bolus loss;Weak lingual manipulation;Reduced posterior propulsion;Decreased bolus cohesion;Holding of bolus;Delayed oral transit;Lingual/palatal residue Oral - Thin Straw NT Oral - Puree Weak lingual manipulation;Decreased bolus cohesion;Delayed oral transit;Reduced posterior propulsion;Holding of bolus;Lingual pumping Oral - Mech Soft -- Oral - Regular -- Oral - Multi-Consistency -- Oral - Pill -- Oral Phase - Comment --  CHL IP PHARYNGEAL PHASE 12/19/2019 Pharyngeal Phase Impaired Pharyngeal- Pudding Teaspoon -- Pharyngeal -- Pharyngeal- Pudding Cup -- Pharyngeal -- Pharyngeal- Honey Teaspoon -- Pharyngeal -- Pharyngeal- Honey Cup -- Pharyngeal -- Pharyngeal- Nectar Teaspoon -- Pharyngeal -- Pharyngeal- Nectar Cup Penetration/Apiration after swallow Pharyngeal Material enters airway, CONTACTS cords and not ejected out Pharyngeal- Nectar Straw -- Pharyngeal -- Pharyngeal- Thin Teaspoon -- Pharyngeal -- Pharyngeal- Thin Cup Reduced anterior  laryngeal mobility;Reduced laryngeal elevation;Delayed swallow initiation-pyriform sinuses  Pharyngeal -- Pharyngeal- Thin Straw -- Pharyngeal -- Pharyngeal- Puree -- Pharyngeal -- Pharyngeal- Mechanical Soft -- Pharyngeal -- Pharyngeal- Regular -- Pharyngeal -- Pharyngeal- Multi-consistency -- Pharyngeal -- Pharyngeal- Pill -- Pharyngeal -- Pharyngeal Comment --  CHL IP CERVICAL ESOPHAGEAL PHASE 12/19/2019 Cervical Esophageal Phase WFL Pudding Teaspoon -- Pudding Cup -- Honey Teaspoon -- Honey Cup -- Nectar Teaspoon -- Nectar Cup -- Nectar Straw -- Thin Teaspoon -- Thin Cup -- Thin Straw -- Puree -- Mechanical Soft -- Regular -- Multi-consistency -- Pill -- Cervical Esophageal Comment -- Arbutus Leas 12/19/2019, 9:56 AM              Recent Labs    12/19/19 0434 12/20/19 0353  WBC 10.7* 11.1*  HGB 10.8* 10.5*  HCT 36.0 35.3*  PLT 277 241   Recent Labs    12/19/19 0434 12/19/19 1308 12/20/19 0353  NA 154*  --  148*  K 3.6  --  3.3*  CL 115*  --  113*  CO2 31  --  29  GLUCOSE 148*  --  249*  BUN 32*  --  26*  CREATININE 1.52*  --  1.33*  CALCIUM 13.4* 13.8* 12.6*    Intake/Output Summary (Last 24 hours) at 12/20/2019 1455 Last data filed at 12/20/2019 1000 Gross per 24 hour  Intake 1877.93 ml  Output 1050 ml  Net 827.93 ml     Physical Exam: Vital Signs Blood pressure (!) 154/64, pulse 75, temperature 98.7 F (37.1 C), temperature source Oral, resp. rate 18, height 5' 7"  (1.702 m), weight 77 kg, SpO2 95 %.   Constitutional: No distress . Vital signs reviewed. Pt sitting up in manual w/c after standing mod-max assist in cera steady with PT and OT; NAD HEENT:  oral membranes dry and lips dry Neck: supple Cardiovascular: RRR Respiratory/Chest: very gurgly and coarse sounds- like needs to cough up mucus but cannot; a few rhonchi also heard, adequate air movement   GI/Abdomen: soft, NT, ND, (+)BS Ext: no clubbing, cyanosis, or edema Skin:No rashes seenLarge egg-sized lipoma right  shoulder.  Neurologic: less alert- was watching me, but didn't respond verbally or nonverbally to me;   motor strength is 3-4/5 in Left, R side no movement in RUE/RLE Sensory exam normal sensation to light touch  in bilateral upper and lower extremities as well as facial DTR's 2++ RUE RLE Right CN VII and poor oral-motor control, decreased gag/cough gurgly sounding- not handling her own secretions-  No evidence of nystagmus Musculoskeletal: no joint pain   Assessment/Plan: 1. Functional deficits secondary to Left brainstem vs subcortical infarct which require 3+ hours per day of interdisciplinary therapy in a comprehensive inpatient rehab setting.  Physiatrist is providing close team supervision and 24 hour management of active medical problems listed below.  Physiatrist and rehab team continue to assess barriers to discharge/monitor patient progress toward functional and medical goals  Care Tool:  Bathing  Bathing activity did not occur: Safety/medical concerns Body parts bathed by patient: Face   Body parts bathed by helper: Right arm, Left arm, Chest, Abdomen, Front perineal area, Buttocks, Right upper leg, Left upper leg, Right lower leg, Left lower leg     Bathing assist Assist Level: 2 Helpers     Upper Body Dressing/Undressing Upper body dressing   What is the patient wearing?: Pull over shirt    Upper body assist Assist Level: Total Assistance - Patient < 25%    Lower Body Dressing/Undressing Lower body dressing  What is the patient wearing?: Incontinence brief, Pants     Lower body assist Assist for lower body dressing: 2 Helpers     Toileting Toileting Toileting Activity did not occur (Clothing management and hygiene only): N/A (no void or bm)  Toileting assist Assist for toileting: 2 Helpers     Transfers Chair/bed transfer  Transfers assist  Chair/bed transfer activity did not occur: Safety/medical concerns  Chair/bed transfer assist level:  Dependent - mechanical lift     Locomotion Ambulation   Ambulation assist   Ambulation activity did not occur: Safety/medical concerns          Walk 10 feet activity   Assist  Walk 10 feet activity did not occur: Safety/medical concerns        Walk 50 feet activity   Assist Walk 50 feet with 2 turns activity did not occur: Safety/medical concerns         Walk 150 feet activity   Assist Walk 150 feet activity did not occur: Safety/medical concerns         Walk 10 feet on uneven surface  activity   Assist Walk 10 feet on uneven surfaces activity did not occur: Safety/medical concerns         Wheelchair     Assist Will patient use wheelchair at discharge?: Yes Type of Wheelchair: Manual Wheelchair activity did not occur: Safety/medical concerns         Wheelchair 50 feet with 2 turns activity    Assist    Wheelchair 50 feet with 2 turns activity did not occur: Safety/medical concerns       Wheelchair 150 feet activity     Assist  Wheelchair 150 feet activity did not occur: Safety/medical concerns       Blood pressure (!) 154/64, pulse 75, temperature 98.7 F (37.1 C), temperature source Oral, resp. rate 18, height 5' 7"  (1.702 m), weight 77 kg, SpO2 95 %.    Medical Problem List and Plan: 1.  Rightt side hemiplegia, dysarthria, +/- aphasia secondary to left brain infarction.  MRI not completed due to pacemaker  -CT demonstrates hypoattenuation in left pons which is consistent with current presentation---increased right hp and increased difficulty with oro-pharyngeal control             -patient may shower             -ELOS/Goals: 25-30 days/min/mod a             Continue PT, OT, SLP  2.  Antithrombotics: -DVT/anticoagulation: Subcutaneous Lovenox             -antiplatelet therapy: Aspirin 81 mg daily, Plavix 75 mg daily 3. Pain Management: Continue Tylenol as needed 4. Mood with memory deficits.  Aricept 10 mg nightly              -antipsychotic agents: N/A 5. Neuropsych: This patient is NOT capable of making decisions on her own behalf. 6. Skin/Wound Care: Routine skin checks 7. Fluids/Electrolytes/Nutrition: Routine in and outs.  CMP ordered for tomorrow a.m. 8.  Hypertension.  Norvasc 2.5 mg daily, Coreg 3.125 mg twice daily.               Monitor with increased mobility Vitals:   12/20/19 0458 12/20/19 1434  BP: (!) 143/78 (!) 154/64  Pulse: 70 75  Resp: 17 18  Temp: 98.8 F (37.1 C) 98.7 F (37.1 C)  SpO2: 95% 95%   -2/28 permissive HTN for this week given small vessel disease  3/1- BP still elevated- permissive HTN this next week.  9.  Diabetes mellitus with hyperglycemia.  Hemoglobin A1c 8.2.  Lantus insulin 10 units twice daily, NovoLog 2 units 3 times daily check blood sugars before meals and at bedtime              CBG (last 3)  Recent Labs    12/20/19 0000 12/20/19 0607 12/20/19 1149  GLUCAP 234* 204* 264*   CBGs elevated increase lantus dose 14U BID   2/28 improving control, not eating much though  -reduce lantus while npo tp 6u bid,hold scheduled novolog   3/1- will monitor because changing to cortrak/TFs and will get TF orders via Nutrition.    3/2: Changed to Cortrak orders today. Discussed with Linna Hoff, PA plans to restart TF.  10.  CAD with stenting as well as pacemaker.  Continue aspirin and Plavix. 11.  CKD stage IIIb.  Creatinine baseline 1.56.               Cr 1.26 on 2/25  3/1- Cr 1.52- at baseline 12.  Chronic diastolic congestive heart failure.  Monitor for any signs of fluid overload             Daily weights 13.  OSA.  CPAP. 14.  Hyperlipidemia.  Lipitor 15.  Chronic normocytic anemia.  Continue iron supplement             hgb 10 16.  Post stroke dysphagia: Dysphagia #2 thin liquids.  Follow-up speech therapy.  Advance as tolerated  2/27 diet downgraded to D1/nectar given pontine infarct/presentation   -aspiration precautions, close observation   -low threshold to  make npo, re-check cxr  2/28 still struggling with mgt of secretions d/t oro-pharyngeal weakness.   -make npo   -start IVF for now   -MBS tomorrow  3/2: see diet/nutrition.  17.  UTI/Citrobacter/EColi: S to . Keflex finish 7d course   -low grade temp yesterday  -change keflex to rocephin1g daily for 2 days  3/1- completed today- wil recheck CBC tomorrow to make sure not increasing after finishing ABX. 3/2: continuing to increase. Will trend tomorrow.  18. Hypernatremia  3/1- needs more fluids- per IM, ordered D5water for now 100cc/hour- will reassess after Na has improved 19. Hypercalcemia  3/1- Ca++ is 13.4- concerning- has been an issue for awhile per IM notes- will get sodium controlled and see if Ca improves as well with fluids- if not, needs additional w/u- some work up already started-  Agree with PTH, PTHrP, TSH, 25vitD, 1,25VitD, SPEP- many of which are already being done.   3/2: will repeat tomorrow.      LOS: 6 days A FACE TO FACE EVALUATION WAS PERFORMED  Clide Deutscher Brynja Marker 12/20/2019, 2:55 PM

## 2019-12-20 NOTE — Plan of Care (Signed)
  Problem: RH SKIN INTEGRITY Goal: RH STG SKIN FREE OF INFECTION/BREAKDOWN Description: Patient will verbalize how to prevent skin breakdown during hospitalization. Outcome: Progressing Goal: RH STG MAINTAIN SKIN INTEGRITY WITH ASSISTANCE Description: STG Maintain Skin Integrity With Moderate Assistance. Outcome: Progressing   Problem: RH SAFETY Goal: RH STG ADHERE TO SAFETY PRECAUTIONS W/ASSISTANCE/DEVICE Description: STG Adhere to Safety Precautions With Moderate Assistance/Device. Outcome: Progressing   Problem: RH PAIN MANAGEMENT Goal: RH STG PAIN MANAGED AT OR BELOW PT'S PAIN GOAL Description: <5 Outcome: Progressing   Problem: Consults Goal: RH STROKE PATIENT EDUCATION Description: See Patient Education module for education specifics  Outcome: Not Progressing Goal: Nutrition Consult-if indicated Outcome: Not Progressing Goal: Diabetes Guidelines if Diabetic/Glucose > 140 Description: If diabetic or lab glucose is > 140 mg/dl - Initiate Diabetes/Hyperglycemia Guidelines & Document Interventions  Outcome: Not Progressing   Problem: RH BOWEL ELIMINATION Goal: RH STG MANAGE BOWEL WITH ASSISTANCE Description: STG Manage Bowel with Moderate Assistance. Outcome: Not Progressing   Problem: RH BLADDER ELIMINATION Goal: RH STG MANAGE BLADDER WITH ASSISTANCE Description: STG Manage Bladder With Moderate Assistance Outcome: Not Progressing   Problem: RH COGNITION-NURSING Goal: RH STG USES MEMORY AIDS/STRATEGIES W/ASSIST TO PROBLEM SOLVE Description: STG Uses Memory Aids/Strategies With Moderate Assistance to Problem Solve. Outcome: Not Progressing   Problem: RH KNOWLEDGE DEFICIT Goal: RH STG INCREASE KNOWLEDGE OF DIABETES Outcome: Not Progressing Goal: RH STG INCREASE KNOWLEDGE OF HYPERTENSION Outcome: Not Progressing Goal: RH STG INCREASE KNOWLEDGE OF DYSPHAGIA/FLUID INTAKE Outcome: Not Progressing

## 2019-12-20 NOTE — Progress Notes (Signed)
[facility] Progress Note    86 y.o. female HTN, heart block s/p PM, DM type 2, OSA on CPAP, CASHD s/p PCI, CVA recently hospitalized with rt sided hemiparesis with hospital course complicated by UTI with Citrobacter and E.Coli treated with Meropenem -> Kelflex. Was on a dysphagia diet but has been having a large amount of secretions and likely to need a NGT for feeds. Ca has been on high side of normal but after a few days it is currently in the 13's.    Assessment/ Plan:   1. Hypercalcemia - continues to increase and has history of periodic elevation which is more consistent with immobilization + prerenal azotemia although she certainly merits a full workup. I do not see any Calcium or VitD administration.  - Agree with PTH, PTHrP, TSH, 25vitD, 1,25VitD, SPEP - Replete volume with free water; free water deficit of >4L suggests she's very volume down with limited intake of free water. - If no improvement in Ca then may need malignancy w/u. - Appears she has primary hyperparathyroidism; should have a low PTH with the level of hypercalcemia she has. - Will start Sensipar 52m BID (start tomorrow as sometimes can cause GI upset)  2. Hypernatremia with a free water deficit of 3.9L - should treat this as chronic hypernatremia as there are 3 days without labs and previously Na was 141. Continue   D5W -> coming down nicely. 3. CHF with EF 50-55% - appears compensated 4. Anemia 5. DM  Subjective:   Daughter bedside and explained everything to her.   Objective:   BP (!) 154/64 (BP Location: Right Arm)   Pulse 75   Temp 98.7 F (37.1 C) (Oral)   Resp 18   Ht 5' 7"  (1.702 m)   Wt 77 kg   SpO2 95%   BMI 26.59 kg/m   Intake/Output Summary (Last 24 hours) at 12/20/2019 1536 Last data filed at 12/20/2019 1000 Gross per 24 hour  Intake 1877.93 ml  Output 1050 ml  Net 827.93 ml   Weight change:   Physical Exam: General appearance: alert and cooperative Resp: diminished  breath sounds bibasilar and bilaterally Cardio: S1, S2 normal GI: soft, non-tender; bowel sounds normal; no masses,  no organomegaly Extremities: edema none Pulses: 2+ and symmetric   Imaging: DG Abd Portable 1V  Result Date: 12/19/2019 CLINICAL DATA:  86year old female status post NG placement. EXAM: PORTABLE ABDOMEN - 1 VIEW COMPARISON:  Earlier radiograph dated 12/19/2019. FINDINGS: Partially visualized enteric tube with side-port in the proximal stomach and tip in the region of the gastric fundus. No additional interval change. IMPRESSION: Enteric tube with tip in the gastric fundus. Electronically Signed   By: AAnner CreteM.D.   On: 12/19/2019 16:09   DG Abd Portable 1V  Result Date: 12/19/2019 CLINICAL DATA:  NG tube placement. EXAM: PORTABLE ABDOMEN - 1 VIEW COMPARISON:  12/13/2019 FINDINGS: There is no visible NG tube in the lower chest or in the abdomen.Bowel gas pattern is normal. Lung bases are clear. No acute bone abnormality. IMPRESSION: No visible NG tube in the lower chest or abdomen. Electronically Signed   By: JLorriane ShireM.D.   On: 12/19/2019 13:43   DG Swallowing Func-Speech Pathology  Result Date: 12/19/2019 Objective Swallowing Evaluation: Type of Study: MBS-Modified Barium Swallow Study  Patient Details Name: PHUYEN PERAZZOMRN: [id number] of Birth: [birth year]Today's Date: 12/19/2019 Time: SLP Start Time (ACUTE ONLY): 0901 -SLP Stop Time (ACUTE ONLY): 0920 SLP Time Calculation (min) ):  19 min Past Medical History: Past Medical History: Diagnosis Date . Anemia  . Arthritis  . Celiac disease  . Chronic systolic CHF (congestive heart failure) ([place])  . Complete heart block ([place])  . Coronary artery disease  . Diabetes mellitus   INSULIN DEPENDENT . GERD (gastroesophageal reflux disease)  . Glaucoma  . Headache(784.0)  . Heart disease  . Hypercholesterolemia  . Hypertension  . Iron deficiency anemia, unspecified  . Ischemic cardiomyopathy  . Memory difficulties 04/14/2019  . Shortness of breath  . Sleep apnea   uses cpap . Stroke ([place])  . Unspecified constipation  Past Surgical History: Past Surgical History: Procedure Laterality Date . BACK SURGERY   . BI-VENTRICULAR PACEMAKER INSERTION (CRT-P)  March 2014  [facility] . BIV PACEMAKER GENERATOR CHANGEOUT N/A 06/29/2019  Procedure: BIV PACEMAKER GENERATOR CHANGEOUT;  Surgeon: Evans Lance, MD;  Location: [facility];  Service: Cardiovascular;  Laterality: N/A; . CARDIAC CATHETERIZATION  09/02/2012 . CARDIAC SURGERY   . CORONARY ANGIOPLASTY WITH STENT PLACEMENT  09/02/2012  RCA . PERCUTANEOUS CORONARY STENT INTERVENTION (PCI-S) N/A 09/02/2012  Procedure: PERCUTANEOUS CORONARY STENT INTERVENTION (PCI-S);  Surgeon: Clent Demark, MD;  Location: [facility] CATH LAB;  Service: Cardiovascular;  Laterality: N/A; . PERMANENT PACEMAKER INSERTION N/A 11/29/2012  Procedure: PERMANENT PACEMAKER INSERTION;  Surgeon: Deboraha Sprang, MD;  Location: [facility] CATH LAB;  Service: Cardiovascular;  Laterality: N/A; . TEMPORARY PACEMAKER INSERTION N/A 11/28/2012  Procedure: TEMPORARY PACEMAKER INSERTION;  Surgeon: Clent Demark, MD;  Location: [place] [facility]ATH LAB;  Service: Cardiovascular;  Laterality: N/A; HPI: Pt is a 86 y.o. F with significant PMH of CHF, DM, CAD with pacemaker, HTN, prior stroke, who presents with several falls in the prior 24 hours and RLE weakness. CT negative for acute abnormality. Pt unable to receive MRI due to pacemaker. Pt admitted to [facility] 12/14/19.  Assessment / Plan / Recommendation CHL IP CLINICAL IMPRESSIONS 12/19/2019 Clinical Impression Pt presents with severe oropharyngeal dysphagia and is unsafe for PO intake at this time. Pt was awake and focused attention was, but she was only able to trigger volitional swallow on command X1 during barium trials across various textures ranging from thin to puree. Pt exhibited severe oral holding of small puree, nectar, and thin boluses. When bolus size increased by clinician in attempt to increase  sensory input, nectar barium spilled anteriorly out of oral cavity as well as prematurely into pharynx with very little intentional oral manipulation of bolus by pt and she was unable to follow commands to trigger volitional swallow. Only 1 reflexive swallow of thin barium was captured on imaging due to pt's decreased ability to follow commands during PO presentations, during which no aspiration or penetration was observed. However, during subsequent imaging, a trace amount of barium was visualized in laryngeal vestibule, which SLP suspects entered airway after the swallow, as a result of either lingual/palatal or vallecular residue. Suctioning was required during 100% PO presentations throughout study today. Given severity of pt's oral phase deficits and reduced ability to follow commands, PO intake is inefficient and unsafe at this time. Recommend pt continue NPO and medical team may wish to pursue alternative means of nutrition and medication administration. ST will continue to provide skilled interventions to work toward readiness for repeat MBSS to assess potential for diet advancement. SLP Visit Diagnosis Dysphagia, oropharyngeal phase (R13.12) Attention and concentration deficit following -- Frontal lobe and executive function deficit following -- Impact on safety and function Severe aspiration risk   CHL IP  TREATMENT RECOMMENDATION 12/08/2019 Treatment Recommendations Therapy as outlined in treatment plan below   Prognosis 12/08/2019 Prognosis for Safe Diet Advancement Fair Barriers to Reach Goals Cognitive deficits Barriers/Prognosis Comment -- CHL IP DIET RECOMMENDATION 12/19/2019 SLP Diet Recommendations NPO;Alternative means - temporary;Alternative means - long-term Liquid Administration via -- Medication Administration Via alternative means Compensations -- Postural Changes --   CHL IP OTHER RECOMMENDATIONS 12/19/2019 Recommended Consults -- Oral Care Recommendations Oral care QID Other Recommendations --    CHL IP FOLLOW UP RECOMMENDATIONS 12/13/2019 Follow up Recommendations 24 hour supervision/assistance;Skilled Nursing facility;Inpatient Rehab   CHL IP FREQUENCY AND DURATION 12/08/2019 Speech Therapy Frequency (ACUTE ONLY) min 2x/week Treatment Duration 2 weeks      CHL IP ORAL PHASE 12/19/2019 Oral Phase Impaired Oral - Pudding Teaspoon -- Oral - Pudding Cup -- Oral - Honey Teaspoon -- Oral - Honey Cup -- Oral - Nectar Teaspoon -- Oral - Nectar Cup Left anterior bolus loss;Right anterior bolus loss;Weak lingual manipulation;Lingual pumping;Incomplete tongue to palate contact;Reduced posterior propulsion;Holding of bolus;Delayed oral transit;Decreased bolus cohesion;Premature spillage;Lingual/palatal residue Oral - Nectar Straw -- Oral - Thin Teaspoon -- Oral - Thin Cup Left anterior bolus loss;Right anterior bolus loss;Weak lingual manipulation;Reduced posterior propulsion;Decreased bolus cohesion;Holding of bolus;Delayed oral transit;Lingual/palatal residue Oral - Thin Straw NT Oral - Puree Weak lingual manipulation;Decreased bolus cohesion;Delayed oral transit;Reduced posterior propulsion;Holding of bolus;Lingual pumping Oral - Mech Soft -- Oral - Regular -- Oral - Multi-Consistency -- Oral - Pill -- Oral Phase - Comment --  CHL IP PHARYNGEAL PHASE 12/19/2019 Pharyngeal Phase Impaired Pharyngeal- Pudding Teaspoon -- Pharyngeal -- Pharyngeal- Pudding Cup -- Pharyngeal -- Pharyngeal- Honey Teaspoon -- Pharyngeal -- Pharyngeal- Honey Cup -- Pharyngeal -- Pharyngeal- Nectar Teaspoon -- Pharyngeal -- Pharyngeal- Nectar Cup Penetration/Apiration after swallow Pharyngeal Material enters airway, CONTACTS cords and not ejected out Pharyngeal- Nectar Straw -- Pharyngeal -- Pharyngeal- Thin Teaspoon -- Pharyngeal -- Pharyngeal- Thin Cup Reduced anterior laryngeal mobility;Reduced laryngeal elevation;Delayed swallow initiation-pyriform sinuses Pharyngeal -- Pharyngeal- Thin Straw -- Pharyngeal -- Pharyngeal- Puree -- Pharyngeal --  Pharyngeal- Mechanical Soft -- Pharyngeal -- Pharyngeal- Regular -- Pharyngeal -- Pharyngeal- Multi-consistency -- Pharyngeal -- Pharyngeal- Pill -- Pharyngeal -- Pharyngeal Comment --  CHL IP CERVICAL ESOPHAGEAL PHASE 12/19/2019 Cervical Esophageal Phase WFL Pudding Teaspoon -- Pudding Cup -- Honey Teaspoon -- Honey Cup -- Nectar Teaspoon -- Nectar Cup -- Nectar Straw -- Thin Teaspoon -- Thin Cup -- Thin Straw -- Puree -- Mechanical Soft -- Regular -- Multi-consistency -- Pill -- Cervical Esophageal Comment -- Arbutus Leas 12/19/2019, 9:56 AM               Labs: BMET Recent Labs  Lab 12/14/19 0552 12/15/19 0643 12/18/19 0951 12/19/19 0434 12/19/19 1308 12/20/19 0353  NA 142 141 151* 154*  --  148*  K 4.5 4.4 4.0 3.6  --  3.3*  CL 105 105 114* 115*  --  113*  CO2 29 28 31 31   --  29  GLUCOSE 232* 283* 207* 148*  --  249*  BUN 27* 30* 31* 32*  --  26*  CREATININE 1.37* 1.26* 1.52* 1.52*  --  1.33*  CALCIUM 12.2* 12.2* 13.7* 13.4* 13.8* 12.6*   CBC Recent Labs  Lab 12/14/19 0552 12/14/19 0552 12/15/19 0643 12/18/19 0951 12/19/19 0434 12/20/19 0353  WBC 10.7*   < > 10.2 11.3* 10.7* 11.1*  NEUTROABS 7.5  --  7.3  --   --  7.4  HGB 10.1*   < > 10.0* 12.3 10.8* 10.5*  HCT 32.3*   < > 32.4* 40.3 36.0 35.3*  MCV 92.3   < > 93.4 95.5 97.0 97.0  PLT 212   < > 231 279 277 241   < > = values in this interval not displayed.    Medications:    . amLODipine  2.5 mg Per Tube Daily  . aspirin  81 mg Per J Tube Daily  . atorvastatin  80 mg Per Tube q morning - 10a  . brimonidine  1 drop Both Eyes BID  . carvedilol  3.125 mg Per Tube BID  . chlorhexidine  15 mL Mouth Rinse BID  . clopidogrel  75 mg Per Tube Daily  . donepezil  10 mg Per Tube QHS  . dorzolamide  1 drop Both Eyes TID  . enoxaparin (LOVENOX) injection  30 mg Subcutaneous Daily  . feeding supplement (PRO-STAT SUGAR FREE 64)  30 mL Per Tube Daily  . ferrous sulfate  300 mg Oral Q breakfast  . [START ON 12/21/2019] free water   140 mL Per Tube Q6H  . insulin aspart  0-9 Units Subcutaneous Q4H  . insulin glargine  6 Units Subcutaneous BID  . latanoprost  1 drop Both Eyes QHS  . mouth rinse  15 mL Mouth Rinse q12n4p  . pantoprazole sodium  40 mg Per Tube QAC breakfast  . polyethylene glycol  17 g Per Tube BID  . senna-docusate  1 tablet Per Tube BID  . sodium chloride flush  10-40 mL Intracatheter Q12H      Otelia Santee, MD 12/20/2019, 3:36 PM

## 2019-12-20 NOTE — Progress Notes (Signed)
Physical Therapy Session Note  Patient Details  Name: [name] MRN: [id number] Date of Birth: [birth year]  Today's Date: 12/20/2019 PT Individual Time: 1100-1133 and 3838-1840 PT Individual Time Calculation (min): 33 min and 27 min Short Term Goals: Week 1:  PT Short Term Goal 1 (Week 1): pt will remain out of bed 1 hour between therapies. PT Short Term Goal 2 (Week 1): Pt will perform bed<>WC transfer with max assist of 1 person PT Short Term Goal 3 (Week 1): Pt will attend to R side 25 % the time with moderate cues in functional tasks. PT Short Term Goal 4 (Week 1): Pt will initiate WC mobility  Skilled Therapeutic Interventions/Progress Updates:  Tx1: Pt presented in w/c agreeable to therapy. Performed rolling L/R with maxA as PTA donned pants in bed. Performed supine to sit maxA with heavy use of bed features. Pt maintained sitting balance EOB x 5 min with min/modA due to increased R lateral lean. Performed STS modA x 2 with Stedy and transferred to [facility]. Pt transported to day room and participated in Cybex Kinetron 80cm/sec 3 sets of 10 cycles for reciprocal activity with PTA providing assist for RLE activation. Pt returned to room at end of session and remained in TIS with belt alarm placed, call bell within reach and needs met.   Tx2: Pt missed 15 min skilled therapy as IV team in room checking PICC line. Once completed pt participated in bed level activity as required increased time to arouse and pt falling asleep while participating in therapy. Pt participated in AROM with LLE and PROM RLE. PTA performed x 10 hip flexion/ex, x 10 knee flexion/extension, and x 10 ankle DF. Pt participated in LLE AROM x 10 DF, SAQ, heel slides, AA SLR, hip abd/add. PTA noted that while pt performing RLE PROM pt would turn head to R. Dgt arrived at end of session with PTA providing brief update. Pt remained in bed with bed alarm on, call bell within reach and needs met.      Therapy  Documentation Precautions:  Precautions Precautions: Fall Restrictions Weight Bearing Restrictions: No General:   Vital Signs: Therapy Vitals Temp: 98.7 F (37.1 C) Temp Source: Oral Pulse Rate: 75 Resp: 18 BP: (!) 154/64 Patient Position (if appropriate): Lying Oxygen Therapy SpO2: 95 % O2 Device: Room Air    Therapy/Group: Individual Therapy  Denzell Colasanti  Raydan Schlabach, PTA  12/20/2019, 4:22 PM

## 2019-12-20 NOTE — Progress Notes (Signed)
Placed patient on CPAP for the night.  

## 2019-12-20 NOTE — Progress Notes (Signed)
Speech Language Pathology Daily Session Note  Patient Details  Name: [name] MRN: [id number] Date of Birth: 1928/03/31  Today's Date: 12/20/2019 SLP Individual Time: 0730-0829 SLP Individual Time Calculation (min): 59 min  Short Term Goals: Week 1: SLP Short Term Goal 1 (Week 1): Pt will consume current diet with minimal overt s/sx aspiration and demonstrate efficient mastication and oral clearance with timely AP transit of boluses with Mod A cues. SLP Short Term Goal 2 (Week 1): Pt will sustain attention to tasks for 15 minutes with Mod A verbal/visual cues. SLP Short Term Goal 3 (Week 1): Pt will demonstrate ability to problem solve during basic functional tasks with Max A verbal/visual cues. SLP Short Term Goal 4 (Week 1): Pt will demonstrate awareness of errors during functional tasks with Max A multimodal cues. SLP Short Term Goal 5 (Week 1): Pt will increase speech intelligibility to 75% at the phrase level with Mod A verbal/visual cues for use of increased vocal intensity and overarticulation.  Skilled Therapeutic Interventions: Pt was seen for skilled ST targeting dysphagia and cognitive goals. Dysphagia interventions focused on secretion management and eliciting volitional throat clears and swallows. Pt's vocal quality remains wet/gurgly. She is able to follow commands to use strong "ah" vocalization to clear through and mobilize a small amount of mildly thick secretions into oral cavity  (on very posterior tongue base), which SLP then removed via suctioning. Thermal stimulation techniques used in attempt to change oral sensory input to facilitate triggering volitional swallow (without any PO present), as verbal and visual commands have been insufficient. Although pt fully participatory, a very cold spoon unable to facilitate volitional swallow in 5/5 attempts today.  Cognitive interventions focused on sustained attention, arousal, and initiation. With extra time and Max A  multimodal cues, pt washed her face with a wet rag, following all verbal commands from clinician. Although she initiated attempt to complete oral care via suction, ultimately Total A was required for successful completion. Speech is still highly unintelligible with very little contact of articulators to produce consonant sounds, however she responded to personally relevant questions with Mod A encouragement and repetitions. She oriented to place with binary choice, however Total A required to orient to situation and month. Pt left laying in bed wit alarm set and needs within reach. Continue per current plan of care.       Pain Pain Assessment Pain Scale: 0-10 Pain Score: 0-No pain  Therapy/Group: Individual Therapy  [name] 12/20/2019, 8:19 AM

## 2019-12-20 NOTE — Progress Notes (Signed)
PROGRESS NOTE    [name]  DVV:[id number] DOB: Feb 18, 1928 DOA: 12/14/2019 PCP: [name], MD   Brief Narrative:  Vicki Yamaguchi Hubbardis an 86 y.o.femalewith past medical history significant for hypertension, heart blocks/ppacemaker,mild cognitive impairment,DM type II, OSA on CPAP, CAD s/p stenting, and CVA. She had recently been hospitalized from 2/16-2/24,with right-sided hemiparesis thought likely due to a stroke. Patient was unable to have MRI due to pacemaker.. During her hospitalization urine culture grew out Citrobacter and E. coli which was pansensitive and she was switched from meropenem to Keflex. She was transferred to inpatient rehab to continue therapies. On 2/27 she was downgraded from dysphagia 2 diet to a dysphagia 1 diet, and on 2/28 made n.p.o. due to difficulty in handling secretions. Current plan is for placement of NGT tube. Patient was switched to Rocephin to complete course of antibiotic. Labs significant for WBC 10.7, hemoglobin 10.8 sodium 154, BUN 32, creatinine 1.52, andcalcium 13.4  Assessment & Plan:   Principal Problem:   Small vessel cerebrovascular accident (CVA) ([place]) Active Problems:   Type 2 diabetes mellitus (HCC)   Hypercalcemia   Hypernatremia   Dysphagia   Right-sided hemiparesis secondary tocerebrovascular accident: Patient admitted after being found to have right-sided paresis. Unable to have MRI due to compatibility with pacemaker. Due to worsening dysphagia patient was made NPO. Current plan is to place NG tube and consulting nutrition for tube feed recommendations.  Patient is receiving PT/OT/speech. -Per PM&R  Hypercalcemia:Acute on chronic. Review of records shows that the patient calcium levels had previously been elevated dating back to 2015.  Nephrology on board.  Defer to them.  Hypernatremia: Acute. 154 and now down to 148.  Continue dextrose fluids.  Now that we have nephrology on board so we will defer  to them.    Hypokalemia: 3.3.  Will replace.  Recheck in the morning.  Acute kidney injury superimposed on chronic kidney disease stage IIIb: Patient baseline creatinine previously noted to be around 1.2-1.4, it was 1.52 with BUN 32 yesterday which has improved and is back to baseline..   Leukocytosis: Acute.  Mild and coming down.  Patient currently on antibiotics for treatments of UTI.  Urinary tract infection: Patient was noted to be positive for E. coli and Citrobacter.  Initially had been put on meropenem, but was later switched to Keflex.  As she is currently n.p.o. has been transitioned to Rocephin to complete course. -Per PM&R  Anemia of chronic disease: At baseline.  Monitor.  Diabetes mellitus type 2: Hemoglobin A1c noted to be 8.6. Continue SSI.  Essential hypertension: Fairly controlled.  Continue current regimen.  DVT prophylaxis: Lovenox Code Status: Full code Family Communication:  None present at bedside.  Plan of care discussed with patient in length and he verbalized understanding and agreed with it. Patient is from: Home Disposition Plan: Per primary service Barriers to discharge: PEG tube placement by primary service   Estimated body mass index is 26.59 kg/m as calculated from the following:   Height as of this encounter: 5' 7"  (1.702 m).   Weight as of this encounter: 77 kg.      Nutritional status:  Nutrition Problem: Inadequate oral intake Etiology: inability to eat, dysphagia   Signs/Symptoms: NPO status   Interventions: Tube feeding    Consultants:   Nephrology and hospitalist  Procedures:   None  Antimicrobials:      Subjective: Patient seen and examined.  She had no complaint.  She was slightly lethargic but oriented x3.  Objective: Vitals:   12/19/19 1352 12/19/19 2010 12/20/19 0458 12/20/19 0500  BP: (!) 189/71 (!) 164/74 (!) 143/78   Pulse: 73 79 70   Resp: 20  17   Temp: 98.1 F (36.7 C) 99.8 F (37.7 C) 98.8  F (37.1 C)   TempSrc: Oral Oral Oral   SpO2: 97% 95% 95%   Weight:    77 kg  Height:        Intake/Output Summary (Last 24 hours) at 12/20/2019 1401 Last data filed at 12/20/2019 1000 Gross per 24 hour  Intake 1877.93 ml  Output 1050 ml  Net 827.93 ml   Filed Weights   12/14/19 1632 12/20/19 0500  Weight: 78.7 kg 77 kg    Examination:  General exam: Appears calm and comfortable but slightly lethargic Respiratory system: Diminished breath sounds due to poor inspiratory effort Cardiovascular system: S1 & S2 heard, RRR. No JVD, murmurs, rubs, gallops or clicks. No pedal edema. Gastrointestinal system: Abdomen is nondistended, soft and nontender. No organomegaly or masses felt. Normal bowel sounds heard. Central nervous system: Lethargic and oriented x3. No focal neurological deficits. Extremities: Symmetric 5 x 5 power. Skin: No rashes, lesions or ulcers   Data Reviewed: I have personally reviewed following labs and imaging studies  CBC: Recent Labs  Lab 12/14/19 0552 12/15/19 0643 12/18/19 0951 12/19/19 0434 12/20/19 0353  WBC 10.7* 10.2 11.3* 10.7* 11.1*  NEUTROABS 7.5 7.3  --   --  7.4  HGB 10.1* 10.0* 12.3 10.8* 10.5*  HCT 32.3* 32.4* 40.3 36.0 35.3*  MCV 92.3 93.4 95.5 97.0 97.0  PLT 212 231 279 277 599   Basic Metabolic Panel: Recent Labs  Lab 12/14/19 0552 12/14/19 0552 12/15/19 0643 12/18/19 0951 12/19/19 0434 12/19/19 1308 12/20/19 0353  NA 142  --  141 151* 154*  --  148*  K 4.5  --  4.4 4.0 3.6  --  3.3*  CL 105  --  105 114* 115*  --  113*  CO2 29  --  28 31 31   --  29  GLUCOSE 232*  --  283* 207* 148*  --  249*  BUN 27*  --  30* 31* 32*  --  26*  CREATININE 1.37*  --  1.26* 1.52* 1.52*  --  1.33*  CALCIUM 12.2*   < > 12.2* 13.7* 13.4* 13.8* 12.6*   < > = values in this interval not displayed.   GFR: Estimated Creatinine Clearance: 30.7 mL/min (A) (by C-G formula based on SCr of 1.33 mg/dL (H)). Liver Function Tests: Recent Labs  Lab  12/15/19 0643 12/18/19 0951  AST 38 27  ALT 33 27  ALKPHOS 61 69  BILITOT 0.2* 0.6  PROT 6.5 6.9  ALBUMIN 3.1* 3.2*   No results for input(s): LIPASE, AMYLASE in the last 168 hours. No results for input(s): AMMONIA in the last 168 hours. Coagulation Profile: No results for input(s): INR, PROTIME in the last 168 hours. Cardiac Enzymes: No results for input(s): CKTOTAL, CKMB, CKMBINDEX, TROPONINI in the last 168 hours. BNP (last 3 results) No results for input(s): PROBNP in the last 8760 hours. HbA1C: No results for input(s): HGBA1C in the last 72 hours. CBG: Recent Labs  Lab 12/19/19 1655 12/19/19 2025 12/20/19 0000 12/20/19 0607 12/20/19 1149  GLUCAP 147* 191* 234* 204* 264*   Lipid Profile: No results for input(s): CHOL, HDL, LDLCALC, TRIG, CHOLHDL, LDLDIRECT in the last 72 hours. Thyroid Function Tests: Recent Labs    12/19/19 1308  TSH  2.616   Anemia Panel: No results for input(s): VITAMINB12, FOLATE, FERRITIN, TIBC, IRON, RETICCTPCT in the last 72 hours. Sepsis Labs: No results for input(s): PROCALCITON, LATICACIDVEN in the last 168 hours.  Recent Results (from the past 240 hour(s))  Culture, Urine     Status: Abnormal   Collection Time: 12/12/19  7:13 AM   Specimen: Urine, Random  Result Value Ref Range Status   Specimen Description URINE, RANDOM  Final   Special Requests   Final    NONE Performed at [facility], [address], [place], [place] [address]    Culture (A)  Final    >=100,000 COLONIES/mL CITROBACTER KOSERI 50,000 COLONIES/mL ESCHERICHIA COLI    Report Status 12/15/2019 FINAL  Final   Organism ID, Bacteria CITROBACTER KOSERI (A)  Final   Organism ID, Bacteria ESCHERICHIA COLI (A)  Final      Susceptibility   Citrobacter koseri - MIC*    CEFAZOLIN <=4 SENSITIVE Sensitive     CEFTRIAXONE <=0.25 SENSITIVE Sensitive     CIPROFLOXACIN <=0.25 SENSITIVE Sensitive     GENTAMICIN <=1 SENSITIVE Sensitive     IMIPENEM <=0.25 SENSITIVE  Sensitive     NITROFURANTOIN 32 SENSITIVE Sensitive     TRIMETH/SULFA <=20 SENSITIVE Sensitive     PIP/TAZO <=4 SENSITIVE Sensitive     * >=100,000 COLONIES/mL CITROBACTER KOSERI   Escherichia coli - MIC*    AMPICILLIN <=2 SENSITIVE Sensitive     CEFAZOLIN <=4 SENSITIVE Sensitive     CEFTRIAXONE <=0.25 SENSITIVE Sensitive     CIPROFLOXACIN <=0.25 SENSITIVE Sensitive     GENTAMICIN <=1 SENSITIVE Sensitive     IMIPENEM <=0.25 SENSITIVE Sensitive     NITROFURANTOIN <=16 SENSITIVE Sensitive     TRIMETH/SULFA <=20 SENSITIVE Sensitive     AMPICILLIN/SULBACTAM <=2 SENSITIVE Sensitive     PIP/TAZO <=4 SENSITIVE Sensitive     * 50,000 COLONIES/mL ESCHERICHIA COLI      Radiology Studies: DG Abd Portable 1V  Result Date: 12/19/2019 CLINICAL DATA:  86 year old female status post NG placement. EXAM: PORTABLE ABDOMEN - 1 VIEW COMPARISON:  Earlier radiograph dated 12/19/2019. FINDINGS: Partially visualized enteric tube with side-port in the proximal stomach and tip in the region of the gastric fundus. No additional interval change. IMPRESSION: Enteric tube with tip in the gastric fundus. Electronically Signed   By: Anner Crete M.D.   On: 12/19/2019 16:09   DG Abd Portable 1V  Result Date: 12/19/2019 CLINICAL DATA:  NG tube placement. EXAM: PORTABLE ABDOMEN - 1 VIEW COMPARISON:  12/13/2019 FINDINGS: There is no visible NG tube in the lower chest or in the abdomen.Bowel gas pattern is normal. Lung bases are clear. No acute bone abnormality. IMPRESSION: No visible NG tube in the lower chest or abdomen. Electronically Signed   By: Lorriane Shire M.D.   On: 12/19/2019 13:43   DG Swallowing Func-Speech Pathology  Result Date: 12/19/2019 Objective Swallowing Evaluation: Type of Study: MBS-Modified Barium Swallow Study  Patient Details Name: JAKAYLA SCHWEPPE MRN: [id number] Date of Birth: 1928/04/05 Today's Date: 12/19/2019 Time: SLP Start Time (ACUTE ONLY): 0901 -SLP Stop Time (ACUTE ONLY): 0920 SLP Time  Calculation (min) ): 19 min Past Medical History: Past Medical History: Diagnosis Date . Anemia  . Arthritis  . Celiac disease  . Chronic systolic CHF (congestive heart failure) ([place])  . Complete heart block ([place])  . Coronary artery disease  . Diabetes mellitus   INSULIN DEPENDENT . GERD (gastroesophageal reflux disease)  . Glaucoma  . Headache(784.0)  .  Heart disease  . Hypercholesterolemia  . Hypertension  . Iron deficiency anemia, unspecified  . Ischemic cardiomyopathy  . Memory difficulties 04/14/2019 . Shortness of breath  . Sleep apnea   uses cpap . Stroke ([place])  . Unspecified constipation  Past Surgical History: Past Surgical History: Procedure Laterality Date . BACK SURGERY   . BI-VENTRICULAR PACEMAKER INSERTION (CRT-P)  March 2014  [facility] . BIV PACEMAKER GENERATOR CHANGEOUT N/A 06/29/2019  Procedure: BIV PACEMAKER GENERATOR CHANGEOUT;  Surgeon: Evans Lance, MD;  Location: [facility];  Service: Cardiovascular;  Laterality: N/A; . CARDIAC CATHETERIZATION  09/02/2012 . CARDIAC SURGERY   . CORONARY ANGIOPLASTY WITH STENT PLACEMENT  09/02/2012  RCA . PERCUTANEOUS CORONARY STENT INTERVENTION (PCI-S) N/A 09/02/2012  Procedure: PERCUTANEOUS CORONARY STENT INTERVENTION (PCI-S);  Surgeon: Clent Demark, MD;  Location: [facility] CATH LAB;  Service: Cardiovascular;  Laterality: N/A; . PERMANENT PACEMAKER INSERTION N/A 11/29/2012  Procedure: PERMANENT PACEMAKER INSERTION;  Surgeon: Deboraha Sprang, MD;  Location: [facility] CATH LAB;  Service: Cardiovascular;  Laterality: N/A; . TEMPORARY PACEMAKER INSERTION N/A 11/28/2012  Procedure: TEMPORARY PACEMAKER INSERTION;  Surgeon: Clent Demark, MD;  Location: [facility] CATH LAB;  Service: Cardiovascular;  Laterality: N/A; HPI: Pt is a 86 y.o. F with significant PMH of CHF, DM, CAD with pacemaker, HTN, prior stroke, who presents with several falls in the prior 24 hours and RLE weakness. CT negative for acute abnormality. Pt unable to receive MRI due to pacemaker. Pt admitted to  [facility] 12/14/19.  Assessment / Plan / Recommendation CHL IP CLINICAL IMPRESSIONS 12/19/2019 Clinical Impression Pt presents with severe oropharyngeal dysphagia and is unsafe for PO intake at this time. Pt was awake and focused attention was, but she was only able to trigger volitional swallow on command X1 during barium trials across various textures ranging from thin to puree. Pt exhibited severe oral holding of small puree, nectar, and thin boluses. When bolus size increased by clinician in attempt to increase sensory input, nectar barium spilled anteriorly out of oral cavity as well as prematurely into pharynx with very little intentional oral manipulation of bolus by pt and she was unable to follow commands to trigger volitional swallow. Only 1 reflexive swallow of thin barium was captured on imaging due to pt's decreased ability to follow commands during PO presentations, during which no aspiration or penetration was observed. However, during subsequent imaging, a trace amount of barium was visualized in laryngeal vestibule, which SLP suspects entered airway after the swallow, as a result of either lingual/palatal or vallecular residue. Suctioning was required during 100% PO presentations throughout study today. Given severity of pt's oral phase deficits and reduced ability to follow commands, PO intake is inefficient and unsafe at this time. Recommend pt continue NPO and medical team may wish to pursue alternative means of nutrition and medication administration. ST will continue to provide skilled interventions to work toward readiness for repeat MBSS to assess potential for diet advancement. SLP Visit Diagnosis Dysphagia, oropharyngeal phase (R13.12) Attention and concentration deficit following -- Frontal lobe and executive function deficit following -- Impact on safety and function Severe aspiration risk   CHL IP TREATMENT RECOMMENDATION 12/08/2019 Treatment Recommendations Therapy as outlined in treatment plan  below   Prognosis 12/08/2019 Prognosis for Safe Diet Advancement Fair Barriers to Reach Goals Cognitive deficits Barriers/Prognosis Comment -- CHL IP DIET RECOMMENDATION 12/19/2019 SLP Diet Recommendations NPO;Alternative means - temporary;Alternative means - long-term Liquid Administration via -- Medication Administration Via alternative means Compensations -- Postural Changes --  CHL IP OTHER RECOMMENDATIONS 12/19/2019 Recommended Consults -- Oral Care Recommendations Oral care QID Other Recommendations --   CHL IP FOLLOW UP RECOMMENDATIONS 12/13/2019 Follow up Recommendations 24 hour supervision/assistance;Skilled Nursing facility;Inpatient Rehab   CHL IP FREQUENCY AND DURATION 12/08/2019 Speech Therapy Frequency (ACUTE ONLY) min 2x/week Treatment Duration 2 weeks      CHL IP ORAL PHASE 12/19/2019 Oral Phase Impaired Oral - Pudding Teaspoon -- Oral - Pudding Cup -- Oral - Honey Teaspoon -- Oral - Honey Cup -- Oral - Nectar Teaspoon -- Oral - Nectar Cup Left anterior bolus loss;Right anterior bolus loss;Weak lingual manipulation;Lingual pumping;Incomplete tongue to palate contact;Reduced posterior propulsion;Holding of bolus;Delayed oral transit;Decreased bolus cohesion;Premature spillage;Lingual/palatal residue Oral - Nectar Straw -- Oral - Thin Teaspoon -- Oral - Thin Cup Left anterior bolus loss;Right anterior bolus loss;Weak lingual manipulation;Reduced posterior propulsion;Decreased bolus cohesion;Holding of bolus;Delayed oral transit;Lingual/palatal residue Oral - Thin Straw NT Oral - Puree Weak lingual manipulation;Decreased bolus cohesion;Delayed oral transit;Reduced posterior propulsion;Holding of bolus;Lingual pumping Oral - Mech Soft -- Oral - Regular -- Oral - Multi-Consistency -- Oral - Pill -- Oral Phase - Comment --  CHL IP PHARYNGEAL PHASE 12/19/2019 Pharyngeal Phase Impaired Pharyngeal- Pudding Teaspoon -- Pharyngeal -- Pharyngeal- Pudding Cup -- Pharyngeal -- Pharyngeal- Honey Teaspoon -- Pharyngeal --  Pharyngeal- Honey Cup -- Pharyngeal -- Pharyngeal- Nectar Teaspoon -- Pharyngeal -- Pharyngeal- Nectar Cup Penetration/Apiration after swallow Pharyngeal Material enters airway, CONTACTS cords and not ejected out Pharyngeal- Nectar Straw -- Pharyngeal -- Pharyngeal- Thin Teaspoon -- Pharyngeal -- Pharyngeal- Thin Cup Reduced anterior laryngeal mobility;Reduced laryngeal elevation;Delayed swallow initiation-pyriform sinuses Pharyngeal -- Pharyngeal- Thin Straw -- Pharyngeal -- Pharyngeal- Puree -- Pharyngeal -- Pharyngeal- Mechanical Soft -- Pharyngeal -- Pharyngeal- Regular -- Pharyngeal -- Pharyngeal- Multi-consistency -- Pharyngeal -- Pharyngeal- Pill -- Pharyngeal -- Pharyngeal Comment --  CHL IP CERVICAL ESOPHAGEAL PHASE 12/19/2019 Cervical Esophageal Phase WFL Pudding Teaspoon -- Pudding Cup -- Honey Teaspoon -- Honey Cup -- Nectar Teaspoon -- Nectar Cup -- Nectar Straw -- Thin Teaspoon -- Thin Cup -- Thin Straw -- Puree -- Mechanical Soft -- Regular -- Multi-consistency -- Pill -- Cervical Esophageal Comment -- [name] 12/19/2019, 9:56 AM               Scheduled Meds: . amLODipine  2.5 mg Per Tube Daily  . aspirin  81 mg Per J Tube Daily  . atorvastatin  80 mg Per Tube q morning - 10a  . brimonidine  1 drop Both Eyes BID  . carvedilol  3.125 mg Per Tube BID  . chlorhexidine  15 mL Mouth Rinse BID  . clopidogrel  75 mg Per Tube Daily  . donepezil  10 mg Per Tube QHS  . dorzolamide  1 drop Both Eyes TID  . enoxaparin (LOVENOX) injection  30 mg Subcutaneous Daily  . feeding supplement (PRO-STAT SUGAR FREE 64)  30 mL Per Tube Daily  . ferrous sulfate  300 mg Oral Q breakfast  . [START ON 12/21/2019] free water  140 mL Per Tube Q6H  . insulin aspart  0-9 Units Subcutaneous Q4H  . insulin glargine  6 Units Subcutaneous BID  . latanoprost  1 drop Both Eyes QHS  . mouth rinse  15 mL Mouth Rinse q12n4p  . pantoprazole sodium  40 mg Per Tube QAC breakfast  . polyethylene glycol  17 g Per Tube BID   . senna-docusate  1 tablet Per Tube BID  . sodium chloride flush  10-40 mL Intracatheter Q12H   Continuous  Infusions: . dextrose 100 mL/hr at 12/20/19 1358  . feeding supplement (JEVITY 1.2 CAL) 1,000 mL (12/20/19 1255)     LOS: 6 days   Time spent: 34 min   Darliss Cheney, MD [facility]  12/20/2019, 2:01 PM   To contact the attending provider between 7A-7P or the covering provider during after hours 7P-7A, please log into the web site [email or web address].

## 2019-12-20 NOTE — Procedures (Signed)
Cortrak  Person Inserting Tube:  [name], [name], RD Tube Type:  Cortrak - 43 inches Tube Location:  Left nare Initial Placement:  Stomach Secured by: Bridle Technique Used to Measure Tube Placement:  Documented cm marking at nare/ corner of mouth Cortrak Secured At:  60 cm    Cortrak Tube Team Note:  Consult received to place a Cortrak feeding tube.   No x-ray is required. RN may begin using tube.   If the tube becomes dislodged please keep the tube and contact the Cortrak team at [email or web address] ([email or web address]) for replacement.  If after hours and replacement cannot be delayed, place a NG tube and confirm placement with an abdominal x-ray.    Larkin Ina, MS, RD, LDN RD pager number and weekend/on-call pager number located in [place].

## 2019-12-20 NOTE — Progress Notes (Signed)
Occupational Therapy Session Note  Patient Details  Name: [name] MRN: [id number] Date of Birth: 1928/02/03  Today's Date: 12/20/2019 OT Individual Time: 2979-8921 OT Individual Time Calculation (min): 28 min  30 missed minutes secondary to lethargy  Short Term Goals: Week 1:  OT Short Term Goal 1 (Week 1): Pt will maintain dynamic balance during self care tasks at mod A or less for 5 minutes. OT Short Term Goal 2 (Week 1): Pt will perform UB dressing with mod A. OT Short Term Goal 3 (Week 1): Pt will perform functional transfer with assist of 1 person safely.  Skilled Therapeutic Interventions/Progress Updates:    Upon entering the room, pt supine in bed and sleeping soundly. Pt remains very lethargic throughout session. OT attempting to alert pt with cold cloth to face but not much change. OT provided oral care with use of suction toothbrush, applied oral gel, and chapstick with total A. OT performing gently PROM of R UE and providing pt with cuing to attend to UE but pt continues to fall in and out of sleep. OT repositioned pt for comfort and safety , bed lowered, and bed alarm activated. 30 missed minutes secondary to pt being unable to actively participate due to lethargy this session. Soft call bell was placed and pt demonstrated ability to press x 2 with L hand.   Therapy Documentation Precautions:  Precautions Precautions: Fall Restrictions Weight Bearing Restrictions: No   Therapy/Group: Individual Therapy  Gypsy Decant 12/20/2019, 10:31 AM

## 2019-12-20 NOTE — Progress Notes (Signed)
Initial Nutrition Assessment  RD working remotely.  DOCUMENTATION CODES:   Not applicable  INTERVENTION:   Initiate Jevity 1.2 @ 20 ml/hr via NGT and increase by 10 ml every 4 hours to goal rate of 60 ml/hr.   30 ml Prostat daily.    If no IVFS, add 140 ml free water flushes every 6 hours  Tube feeding regimen provides 1828 kcal (100% of needs), 95 grams of protein, and 1162 ml of H2O. Total free water: 1722 ml  NUTRITION DIAGNOSIS:   Inadequate oral intake related to inability to eat, dysphagia as evidenced by NPO status.  GOAL:   Patient will meet greater than or equal to 90% of their needs  MONITOR:   Diet advancement, Labs, Weight trends, TF tolerance, Skin, I & O's  REASON FOR ASSESSMENT:   Consult Enteral/tube feeding initiation and management  ASSESSMENT:   [name] is an 86 year old right-handed female with history of CAD with stenting, CKD stage III with creatinine 1.56, CVA maintained on Plavix, memory loss maintained on Aricept followed by Dr. Jannifer Franklin, heart block with pacemaker, hypertension, type 2 diabetes mellitus, OSA with CPAP, chronic normocytic anemia, chronic diastolic congestive heart failure.  History taken from chart review due to cognition.  Patient lives with family.  1 level home.  Used a cane prior to admission for mobility and daughter assist with some basic ADLs.  She presented on 12/07/2019 after fall without LOC.  She was noted to have slurred speech and left hemiparesis.  Cranial CT scan unremarkable for acute intracranial process.  Patient did not receive TPA.  MRI not completed due to pacemaker.  Echocardiogram with ejection fraction of 55% without emboli.  Neurology follow-up currently maintained on aspirin and Plavix for CVA prophylaxis subcutaneous Lovenox for DVT prophylaxis.  Admission chemistries with creatinine 1.62, SARS coronavirus negative.  Hospital course further complicated by postop dysphagia, on a dysphagia 2 thin liquid  diet.  Urinalysis study 12/12/2019 greater than 100,000 Citrobacter placed on Merrem 12/13/2019 for UTI.  Therapy evaluations completed and patient was admitted for a comprehensive rehab program.  Please see preadmission assessment from earlier today as well.  Pt admitted with lt side hemiplegia, dysarthria, and aphasia secondary to lt brain infarction.   2/25- s/p BSE- diet downgraded from dysphagia 1 diet with thin liquids to dysphagia 1 diet with thin liquids, due to fatigue; head CT revealed subacute left pontine infarct 2/26- s/p BSE- diet downgraded to dysphagia 1 with nectar thick liquids 3/1- s/p MBSS- recommend NPO, unsafe for PO intake at this time; NGT placed at bedside- tip of tube confirmed in stomach via KUB  Reviewed I/O's: +1.4 L x 24 hours and -3.1 L since admission  UOP: 500 ml x 24 hours  Pt unable to provide history secondary to aphasia.  Pt now NPO due to dysphagia and lethargy; previously on a dysphagia 1 diet with minimal intake (PO: 15-40%). NGT placed yesterday with plans to replace with cortrak today. RN paged RD requesting TF orders.   Per MD notes, pt may require PEG placement.   Reviewed wt hx; wt has been stable over the past year.   Medications reviewed and include dextrose 5% solution @ 100 ml/hr.   Lab Results  Component Value Date   HGBA1C 8.2 (H) 12/07/2019   PTA DM medications are 10-20 units insulin degludec q HS, 6 units insulin lispro TID,  And 5 mg linagliptin daily. Per ADA's Standards of Medical Care of Diabetes, glycemic targets for older adults  who have multiple co-morbidities, cognitive impairments, and functional dependence should be less stringent (Hgb A1c <8.0-8.5).   Labs reviewed: Na: 148, K: 3.3, CBGS: 191-234 (inpatient orders for glycemic control are 0-9 units insulin aspart every 6 hours and 6 units insulin glargine BID).   Diet Order:   Diet Order            Diet NPO time specified  Diet effective now              EDUCATION  NEEDS:   No education needs have been identified at this time  Skin:  Skin Assessment: Reviewed RN Assessment  Last BM:  12/18/19  Height:   Ht Readings from Last 1 Encounters:  12/14/19 5' 7"  (1.702 m)    Weight:   Wt Readings from Last 1 Encounters:  12/20/19 77 kg    Ideal Body Weight:  61.4 kg  BMI:  Body mass index is 26.59 kg/m.  Estimated Nutritional Needs:   Kcal:  1700-1900  Protein:  85-100 grams  Fluid:  > 1.7 L    Loistine Chance, RD, LDN, [place] Registered Dietitian II Certified Diabetes Care and Education Specialist Please refer to [facility] for RD and/or RD on-call/weekend/after hours pager

## 2019-12-21 ENCOUNTER — Inpatient Hospital Stay (HOSPITAL_COMMUNITY): Payer: Medicare Other | Admitting: Speech Pathology

## 2019-12-21 ENCOUNTER — Inpatient Hospital Stay (HOSPITAL_COMMUNITY): Payer: Medicare Other | Admitting: *Deleted

## 2019-12-21 ENCOUNTER — Inpatient Hospital Stay (HOSPITAL_COMMUNITY): Payer: Medicare Other | Admitting: Occupational Therapy

## 2019-12-21 ENCOUNTER — Inpatient Hospital Stay (HOSPITAL_COMMUNITY): Payer: Medicare Other | Admitting: Physical Therapy

## 2019-12-21 DIAGNOSIS — E876 Hypokalemia: Secondary | ICD-10-CM

## 2019-12-21 LAB — BASIC METABOLIC PANEL
Anion gap: 8 (ref 5–15)
BUN: 22 mg/dL (ref 8–23)
CO2: 26 mmol/L (ref 22–32)
Calcium: 11.5 mg/dL — ABNORMAL HIGH (ref 8.9–10.3)
Chloride: 104 mmol/L (ref 98–111)
Creatinine, Ser: 1.33 mg/dL — ABNORMAL HIGH (ref 0.44–1.00)
GFR calc Af Amer: 41 mL/min — ABNORMAL LOW (ref >60)
GFR calc non Af Amer: 35 mL/min — ABNORMAL LOW (ref >60)
Glucose, Bld: 388 mg/dL — ABNORMAL HIGH (ref 70–99)
Potassium: 3.2 mmol/L — ABNORMAL LOW (ref 3.5–5.1)
Sodium: 138 mmol/L (ref 135–145)

## 2019-12-21 LAB — CBC WITH DIFFERENTIAL/PLATELET
Abs Immature Granulocytes: 0.04 10*3/uL (ref 0.00–0.07)
Basophils Absolute: 0 10*3/uL (ref 0.0–0.1)
Basophils Relative: 0 %
Eosinophils Absolute: 0.2 10*3/uL (ref 0.0–0.5)
Eosinophils Relative: 2 %
HCT: 34.2 % — ABNORMAL LOW (ref 36.0–46.0)
Hemoglobin: 10.4 g/dL — ABNORMAL LOW (ref 12.0–15.0)
Immature Granulocytes: 0 %
Lymphocytes Relative: 25 %
Lymphs Abs: 2.7 10*3/uL (ref 0.7–4.0)
MCH: 28.9 pg (ref 26.0–34.0)
MCHC: 30.4 g/dL (ref 30.0–36.0)
MCV: 95 fL (ref 80.0–100.0)
Monocytes Absolute: 1 10*3/uL (ref 0.1–1.0)
Monocytes Relative: 9 %
Neutro Abs: 6.7 10*3/uL (ref 1.7–7.7)
Neutrophils Relative %: 64 %
Platelets: 227 10*3/uL (ref 150–400)
RBC: 3.6 MIL/uL — ABNORMAL LOW (ref 3.87–5.11)
RDW: 12.1 % (ref 11.5–15.5)
WBC: 10.6 10*3/uL — ABNORMAL HIGH (ref 4.0–10.5)
nRBC: 0 % (ref 0.0–0.2)

## 2019-12-21 LAB — CALCITRIOL (1,25 DI-OH VIT D): Vit D, 1,25-Dihydroxy: 42.6 pg/mL (ref 19.9–79.3)

## 2019-12-21 LAB — GLUCOSE, CAPILLARY
Glucose-Capillary: 248 mg/dL — ABNORMAL HIGH (ref 70–99)
Glucose-Capillary: 314 mg/dL — ABNORMAL HIGH (ref 70–99)
Glucose-Capillary: 319 mg/dL — ABNORMAL HIGH (ref 70–99)
Glucose-Capillary: 354 mg/dL — ABNORMAL HIGH (ref 70–99)
Glucose-Capillary: 371 mg/dL — ABNORMAL HIGH (ref 70–99)
Glucose-Capillary: 410 mg/dL — ABNORMAL HIGH (ref 70–99)

## 2019-12-21 MED ORDER — INSULIN ASPART 100 UNIT/ML ~~LOC~~ SOLN
0.0000 [IU] | Freq: Three times a day (TID) | SUBCUTANEOUS | Status: DC
  Administered 2019-12-21: 11 [IU] via SUBCUTANEOUS
  Administered 2019-12-22: 15 [IU] via SUBCUTANEOUS

## 2019-12-21 MED ORDER — POTASSIUM CHLORIDE 20 MEQ/15ML (10%) PO SOLN
40.0000 meq | ORAL | Status: AC
  Administered 2019-12-21 (×2): 40 meq
  Filled 2019-12-21 (×2): qty 30

## 2019-12-21 MED ORDER — INSULIN ASPART 100 UNIT/ML ~~LOC~~ SOLN
0.0000 [IU] | Freq: Every day | SUBCUTANEOUS | Status: DC
  Administered 2019-12-21: 2 [IU] via SUBCUTANEOUS

## 2019-12-21 MED ORDER — POTASSIUM CHLORIDE 20 MEQ PO PACK: 40.0000 meq | PACK | ORAL | Status: DC

## 2019-12-21 NOTE — Progress Notes (Signed)
Physical Therapy Session Note  Patient Details  Name: [name] MRN: [id number] Date of Birth: 01/20/1928  Today's Date: 12/21/2019 PT Individual Time: 805-845 PT Individual Time Calculation (min): 40 min   Short Term Goals: Week 1:  PT Short Term Goal 1 (Week 1): pt will remain out of bed 1 hour between therapies. PT Short Term Goal 2 (Week 1): Pt will perform bed<>WC transfer with max assist of 1 person PT Short Term Goal 3 (Week 1): Pt will attend to R side 25 % the time with moderate cues in functional tasks. PT Short Term Goal 4 (Week 1): Pt will initiate WC mobility  Skilled Therapeutic Interventions/Progress Updates:   Pt received sitting in WC and agreeable to PT. Pt transported to orthogym in TIS WC. Dynavision sustained attention and visual scanning and trunkal NMR: LUE lower R and L quadrant 2 x 2 min with min assist for weight shifting and correction to midline, as well as  Cues for improved visual scanning to the R. UE performed 2 rings, lower R and L quadrant 2 x 2 min with total A for RUE management mod assist for improved trunk control. Prolonged therapeutic rest break between bouts. Pt able to remain alert and awake throughout majority of treatment, requiring only min cues for arousal between bout.  Patient returned to room and left sitting in TIS [facility] with call bell in reach and all needs met.        Therapy Documentation Precautions:  Precautions Precautions: Fall Restrictions Weight Bearing Restrictions: No Vital Signs: Therapy Vitals Temp: 97.8 F (36.6 C) Temp Source: Oral Pulse Rate: 76 BP: (!) 126/51 Patient Position (if appropriate): Lying Oxygen Therapy SpO2: 100 % O2 Device: Room Air Pain: Faces: no pain  Therapy/Group: Individual Therapy  Lorie Phenix 12/21/2019, 5:26 PM

## 2019-12-21 NOTE — Progress Notes (Signed)
[facility] Progress Note   86 y.o.femaleHTN, heart block s/p PM, DM type 2, OSA on CPAP, CASHD s/p PCI, CVA recently hospitalized with rt sided hemiparesis with hospital course complicated by UTI with Citrobacter and E.Coli treated with Meropenem -> Kelflex. Was on a dysphagia diet but has been having a large amount of secretions and likely to need a NGT for feeds. Ca has been on high side of normal but after a few days it is currently in the 13's.   Assessment/ Plan:   1. Hypercalcemia - continues to increase and has history of periodic elevation which is more consistent with immobilization + prerenal azotemia although she certainly merits a full workup. I do not see any Calcium or VitD administration.  - Agree with PTH, PTHrP, TSH, 25vitD, 1,25VitD, SPEP - She has primary hyperparathyroidism; should have a low PTH with the level of hypercalcemia she has. - Starting Sensipar 50m BID (3/3)  2. Hypernatremia with a free water deficit of 3.9L - nice response. 3. CHF with EF 50-55% - appears compensated 4. Anemia 5. DM  Subjective:   High residual on bladder scan -> in & out cath with over over 400 out.    Objective:   BP (!) 121/50 (BP Location: Right Arm)   Pulse 70   Temp 98.3 F (36.8 C)   Resp 17   Ht 5' 7"  (1.702 m)   Wt 78.3 kg   SpO2 97%   BMI 27.04 kg/m   Intake/Output Summary (Last 24 hours) at 12/21/2019 1335 Last data filed at 12/21/2019 1300 Gross per 24 hour  Intake 2173.57 ml  Output 1375 ml  Net 798.57 ml   Weight change: 1.3 kg  Physical Exam: General appearance:alert and cooperative Resp:diminished breath soundsbibasilar and bilaterally Cardio:S1, S2 normal GLP:FXTK non-tender; bowel sounds normal; no masses, no organomegaly Extremities:[place] Pulses:2+ and symmetric  Imaging: DG Abd Portable 1V  Result Date: 12/19/2019 CLINICAL DATA:  86year old female status post NG placement. EXAM: PORTABLE ABDOMEN - 1 VIEW  COMPARISON:  Earlier radiograph dated 12/19/2019. FINDINGS: Partially visualized enteric tube with side-port in the proximal stomach and tip in the region of the gastric fundus. No additional interval change. IMPRESSION: Enteric tube with tip in the gastric fundus. Electronically Signed   By: AAnner CreteM.D.   On: 12/19/2019 16:09   DG Abd Portable 1V  Result Date: 12/19/2019 CLINICAL DATA:  NG tube placement. EXAM: PORTABLE ABDOMEN - 1 VIEW COMPARISON:  12/13/2019 FINDINGS: There is no visible NG tube in the lower chest or in the abdomen.Bowel gas pattern is normal. Lung bases are clear. No acute bone abnormality. IMPRESSION: No visible NG tube in the lower chest or abdomen. Electronically Signed   By: JLorriane ShireM.D.   On: 12/19/2019 13:43    Labs: BMET Recent Labs  Lab 12/15/19 [id number] 0951 12/19/19 0434 12/19/19 1308 12/20/19 0353 12/21/19 0350  NA 141 151* 154*  --  148* 138  K 4.4 4.0 3.6  --  3.3* 3.2*  CL 105 114* 115*  --  113* 104  CO2 28 31 31   --  29 26  GLUCOSE 283* 207* 148*  --  249* 388*  BUN 30* 31* 32*  --  26* 22  CREATININE 1.26* 1.52* 1.52*  --  1.33* 1.33*  CALCIUM 12.2* 13.7* 13.4* 13.8* 12.6* 11.5*   CBC Recent Labs  Lab 12/15/19 [id number] 0643 12/18/19 0951 12/19/19 0434 12/20/19 0353 12/21/19 0350  WBC 10.2   < >  11.3* 10.7* 11.1* 10.6*  NEUTROABS 7.3  --   --   --  7.4 6.7  HGB 10.0*   < > 12.3 10.8* 10.5* 10.4*  HCT 32.4*   < > 40.3 36.0 35.3* 34.2*  MCV 93.4   < > 95.5 97.0 97.0 95.0  PLT 231   < > 279 277 241 227   < > = values in this interval not displayed.    Medications:    . amLODipine  2.5 mg Per Tube Daily  . aspirin  81 mg Per J Tube Daily  . atorvastatin  80 mg Per Tube q morning - 10a  . brimonidine  1 drop Both Eyes BID  . carvedilol  3.125 mg Per Tube BID  . chlorhexidine  15 mL Mouth Rinse BID  . cinacalcet  30 mg Oral BID WC  . clopidogrel  75 mg Per Tube Daily  . donepezil  10 mg Per Tube QHS  .  dorzolamide  1 drop Both Eyes TID  . enoxaparin (LOVENOX) injection  30 mg Subcutaneous Daily  . feeding supplement (PRO-STAT SUGAR FREE 64)  30 mL Per Tube Daily  . ferrous sulfate  300 mg Oral Q breakfast  . free water  140 mL Per Tube Q6H  . insulin aspart  0-9 Units Subcutaneous Q4H  . insulin glargine  6 Units Subcutaneous BID  . latanoprost  1 drop Both Eyes QHS  . mouth rinse  15 mL Mouth Rinse q12n4p  . pantoprazole sodium  40 mg Per Tube QAC breakfast  . polyethylene glycol  17 g Per Tube BID  . senna-docusate  1 tablet Per Tube BID  . sodium chloride flush  10-40 mL Intracatheter Q12H      Otelia Santee, MD 12/21/2019, 1:35 PM

## 2019-12-21 NOTE — Progress Notes (Signed)
Physical Therapy Session Note  Patient Details  Name: [name] MRN: [id number] Date of Birth: December 19, 1927  Today's Date: 12/21/2019 PT Individual Time: 9179-1505 PT Individual Time Calculation (min): 39 min   Short Term Goals: Week 1:  PT Short Term Goal 1 (Week 1): pt will remain out of bed 1 hour between therapies. PT Short Term Goal 2 (Week 1): Pt will perform bed<>WC transfer with max assist of 1 person PT Short Term Goal 3 (Week 1): Pt will attend to R side 25 % the time with moderate cues in functional tasks. PT Short Term Goal 4 (Week 1): Pt will initiate WC mobility  Skilled Therapeutic Interventions/Progress Updates: Pt presented in bed awake with nsg present completing toileting. Session focused on sitting balance EOB. Performed supine to sit total A with pt able to initiate LLE but required assist to complete and required total A for RLE management and truncal support. Once pt sitting EOB, required mod to maxA as pt would lean to R and only able to correct intermittently with max multimodal cues. Pt participated in reaching activities with LUE for trunk activation and leans to L for elongation of R trunk. Pt inconsistent with reaching activities at time reaching with only verbal cues then other times requiring HOH assist to reach target in similar area. With fatigue pt noted to have increased forward lean which pt was unable to correct with max multimodal cues requiring maxA for correction. Pt performed STS in [place] with maxA x2 to move to [facility]. Performed dependent transfer to supine and dependent transfer x 2 to [facility]. Pt positioned to comfort and left with soft touch bell within reach and needs met.      Therapy Documentation Precautions:  Precautions Precautions: Fall Restrictions Weight Bearing Restrictions: No General:   Vital Signs: Therapy Vitals Temp: 97.8 F (36.6 C) Temp Source: Oral Pulse Rate: 76 BP: (!) 126/51 Patient Position (if appropriate):  Lying Oxygen Therapy SpO2: 100 % O2 Device: Room Air   Therapy/Group: Individual Therapy  Hartford Maulden  Yalitza Teed, PTA  12/21/2019, 3:47 PM

## 2019-12-21 NOTE — Progress Notes (Signed)
Team Conference Report to Patient/Family  Team Conference discussion was reviewed with the patient's daughter and grand-daughter Elwin Sleight, including current functional status, goals downgraded to moderate assistance, any changes in plan of care 01/06/20 with a plan to go home with family or option to go SNF and discharge would be when a bed became available and may be before the target discharge date of 01/06/20.  Patient's daughter and grand-daughter expressed understanding information reviewed. Reviewed current nutritional means and medication administration and rationale behind holding off on oral diet per SLP. Acknowledge that this was a lot of information to take in. Encouraged family to discuss information reviewed and call if they had additional questions and if they made a decision on the discharge plan. Encouraged both to review medical concerns with the MD/PA.    Margarito Liner 12/21/2019, 6:33 PM

## 2019-12-21 NOTE — Significant Event (Signed)
Blood sugar was 410 at lunch,PA was informed.

## 2019-12-21 NOTE — Progress Notes (Signed)
PROGRESS NOTE  [name]:[id number] DOB: Nov 05, 1927   PCP: [name], MD  DOA: 12/14/2019 LOS: 7  Brief Narrative / Interim history: 86 y.o.femaleHTN, heart block s/p PM, DM type 2, OSA on CPAP, CAD s/p PCI, CVA recently hospitalized (2/16-2/24) with rt sided hemiparesis and dysphagia, and discharged to [facility]. Hospital course complicated by UTI with Citrobacter and E.Coli treated with Meropenem and transitioned toKelflex.  TRH consulted on 3/1 for "medical management". On 2/27 she was downgraded from dysphagia 2 diet to a dysphagia 1 diet, and on 2/28 made n.p.o. due to difficulty in handling secretions. Current plan is for placement of NGT tube. Patient was switched to Rocephin to complete antibiotic course for UTI.    Labs significant for WBC 10.7, hemoglobin 10.8 sodium 154, BUN 32, creatinine 1.52, andcalcium 13.4  Nephrology consulted as well.  Subjective: No major events overnight or this morning.  Hypernatremia resolved.  Hypercalcemia improved.  Now with hypokalemia and hyperglycemia.  She was on D5 for hypernatremia.  Objective: Vitals:   12/20/19 2252 12/21/19 0359 12/21/19 0404 12/21/19 1436  BP:  (!) 121/50  (!) 126/51  Pulse: 77 70  76  Resp: 19 17    Temp:  98.3 F (36.8 C)  97.8 F (36.6 C)  TempSrc:    Oral  SpO2: 95% 97%  100%  Weight:   78.3 kg   Height:        Intake/Output Summary (Last 24 hours) at 12/21/2019 1610 Last data filed at 12/21/2019 1300 Gross per 24 hour  Intake 2173.57 ml  Output 1375 ml  Net 798.57 ml   Filed Weights   12/14/19 1632 12/20/19 0500 12/21/19 0404  Weight: 78.7 kg 77 kg 78.3 kg    Examination:  GENERAL: Sitting on wheelchair.  No apparent distress. HEENT: MMM.  Vision and hearing grossly intact.  NECK: Supple.  No apparent JVD.  RESP:  No IWOB. Good air movement bilaterally. CVS:  RRR. Heart sounds normal.  ABD/GI/GU: Bowel sounds present. Soft. Non tender.  MSK/EXT:  Moves extremities. No apparent  deformity.  Trace edema SKIN: no apparent skin lesion or wound NEURO: Awake and follows command.  Has aphasia and right hemiparesis.  No apparent focal neuro deficit. PSYCH: Calm. Normal affect.   Procedures:  None  Assessment & Plan: Hypercalcemia: Likely a combination of dehydration, immobilization and primary hyperparathyroidism -Nephrology managing-started Sensipar 30 mg twice daily  Hyponatremia: Resolved with D5 and free water -Discontinue D5 in the setting of hyperglycemia -Continue free water  Uncontrolled diabetes with hyperglycemia: Likely due to D5 for hypernatremia Recent Labs    12/21/19 0355 12/21/19 0848 12/21/19 1159  GLUCAP 354* 371* 410*  -Discontinue D5 -Increase SSI to-moderate -Continue Lantus 6 units twice daily -Continue statin  Subacute CVA with right-sided hemiparesis -Per primary-on statin, Plavix and aspirin -PT/OT/SLP  Dysphagia: Downgraded from dysphagia to 2 dysphagia 1, then to NPO -Continue tube feed  Hypokalemia -Replenish and recheck  AKI on CKD-3b: prerenal azotemia partly due to dehydration in the setting of poor p.o. intake.  Improving. -Free water and tube feed as above  Urinary tract infection: Urine culture with E. coli and Citrobacter. -Completed antibiotics with meropenem> Keflex> ceftriaxone  Anemia of chronic disease: H&H stable. -Continue monitoring -Continue iron  Essential hypertension: Normotensive -Continue current regimen  History of complete AVB s/p PPM     Nutrition Problem: Inadequate oral intake Etiology: inability to eat, dysphagia  Signs/Symptoms: NPO status  Interventions: Tube feeding   DVT prophylaxis: Subcu  Lovenox Code Status: Full code Family Communication: Patient and/or RN. Available if any question. Disposition: Per primary  Consultants: We are   Microbiology summarized: None  Sch Meds:  Scheduled Meds: . amLODipine  2.5 mg Per Tube Daily  . aspirin  81 mg Per J Tube Daily  .  atorvastatin  80 mg Per Tube q morning - 10a  . brimonidine  1 drop Both Eyes BID  . carvedilol  3.125 mg Per Tube BID  . chlorhexidine  15 mL Mouth Rinse BID  . cinacalcet  30 mg Oral BID WC  . clopidogrel  75 mg Per Tube Daily  . donepezil  10 mg Per Tube QHS  . dorzolamide  1 drop Both Eyes TID  . enoxaparin (LOVENOX) injection  30 mg Subcutaneous Daily  . feeding supplement (PRO-STAT SUGAR FREE 64)  30 mL Per Tube Daily  . ferrous sulfate  300 mg Oral Q breakfast  . free water  140 mL Per Tube Q6H  . insulin aspart  0-9 Units Subcutaneous Q4H  . insulin glargine  6 Units Subcutaneous BID  . latanoprost  1 drop Both Eyes QHS  . mouth rinse  15 mL Mouth Rinse q12n4p  . pantoprazole sodium  40 mg Per Tube QAC breakfast  . polyethylene glycol  17 g Per Tube BID  . senna-docusate  1 tablet Per Tube BID  . sodium chloride flush  10-40 mL Intracatheter Q12H   Continuous Infusions: . feeding supplement (JEVITY 1.2 CAL) 1,000 mL (12/21/19 1247)   PRN Meds:.acetaminophen **OR** acetaminophen (TYLENOL) oral liquid 160 mg/5 mL **OR** acetaminophen, ipratropium-albuterol, ondansetron, sodium chloride flush, sorbitol  Antimicrobials: Anti-infectives (From admission, onward)   Start     Dose/Rate Route Frequency Ordered Stop   12/18/19 0945  cefTRIAXone (ROCEPHIN) 1 g in sodium chloride 0.9 % 100 mL IVPB     1 g 200 mL/hr over 30 Minutes Intravenous Every 24 hours 12/18/19 0932 12/19/19 1050   12/14/19 2000  meropenem (MERREM) 1 g in sodium chloride 0.9 % 100 mL IVPB  Status:  Discontinued     1 g 200 mL/hr over 30 Minutes Intravenous Every 12 hours 12/14/19 1619 12/14/19 1639   12/14/19 2000  cephALEXin (KEFLEX) capsule 500 mg  Status:  Discontinued     500 mg Oral Every 12 hours 12/14/19 1639 12/18/19 0932       I have personally reviewed the following labs and images: CBC: Recent Labs  Lab 12/15/19 0643 12/18/19 0951 12/19/19 0434 12/20/19 0353 12/21/19 0350  WBC 10.2 11.3*  10.7* 11.1* 10.6*  NEUTROABS 7.3  --   --  7.4 6.7  HGB 10.0* 12.3 10.8* 10.5* 10.4*  HCT 32.4* 40.3 36.0 35.3* 34.2*  MCV 93.4 95.5 97.0 97.0 95.0  PLT 231 279 277 241 227   BMP &GFR Recent Labs  Lab 12/15/19 0643 12/15/19 0643 12/18/19 0951 12/19/19 0434 12/19/19 1308 12/20/19 0353 12/21/19 0350  NA 141  --  151* 154*  --  148* 138  K 4.4  --  4.0 3.6  --  3.3* 3.2*  CL 105  --  114* 115*  --  113* 104  CO2 28  --  31 31  --  29 26  GLUCOSE 283*  --  207* 148*  --  249* 388*  BUN 30*  --  31* 32*  --  26* 22  CREATININE 1.26*  --  1.52* 1.52*  --  1.33* 1.33*  CALCIUM 12.2*   < >  13.7* 13.4* 13.8* 12.6* 11.5*   < > = values in this interval not displayed.   Estimated Creatinine Clearance: 30.9 mL/min (A) (by C-G formula based on SCr of 1.33 mg/dL (H)). Liver & Pancreas: Recent Labs  Lab 12/15/19 0643 12/18/19 0951  AST 38 27  ALT 33 27  ALKPHOS 61 69  BILITOT 0.2* 0.6  PROT 6.5 6.9  ALBUMIN 3.1* 3.2*   No results for input(s): LIPASE, AMYLASE in the last 168 hours. No results for input(s): AMMONIA in the last 168 hours. Diabetic: No results for input(s): HGBA1C in the last 72 hours. Recent Labs  Lab 12/20/19 2029 12/21/19 0006 12/21/19 0355 12/21/19 0848 12/21/19 1159  GLUCAP 253* 319* 354* 371* 410*   Cardiac Enzymes: No results for input(s): CKTOTAL, CKMB, CKMBINDEX, TROPONINI in the last 168 hours. No results for input(s): PROBNP in the last 8760 hours. Coagulation Profile: No results for input(s): INR, PROTIME in the last 168 hours. Thyroid Function Tests: Recent Labs    12/19/19 1308  TSH 2.616   Lipid Profile: No results for input(s): CHOL, HDL, LDLCALC, TRIG, CHOLHDL, LDLDIRECT in the last 72 hours. Anemia Panel: No results for input(s): VITAMINB12, FOLATE, FERRITIN, TIBC, IRON, RETICCTPCT in the last 72 hours. Urine analysis:    Component Value Date/Time   COLORURINE YELLOW 12/12/2019 1030   APPEARANCEUR HAZY (A) 12/12/2019 1030    LABSPEC 1.015 12/12/2019 1030   PHURINE 6.0 12/12/2019 1030   GLUCOSEU NEGATIVE 12/12/2019 1030   HGBUR NEGATIVE 12/12/2019 1030   BILIRUBINUR NEGATIVE 12/12/2019 1030   KETONESUR NEGATIVE 12/12/2019 1030   PROTEINUR NEGATIVE 12/12/2019 1030   UROBILINOGEN 0.2 08/07/2015 1826   NITRITE NEGATIVE 12/12/2019 1030   LEUKOCYTESUR SMALL (A) 12/12/2019 1030   Sepsis Labs: Invalid input(s): PROCALCITONIN, [place]  Microbiology: Recent Results (from the past 240 hour(s))  Culture, Urine     Status: Abnormal   Collection Time: 12/12/19  7:13 AM   Specimen: Urine, Random  Result Value Ref Range Status   Specimen Description URINE, RANDOM  Final   Special Requests   Final    NONE Performed at [facility], [address] [place], [place] [address]    Culture (A)  Final    >=100,000 COLONIES/mL CITROBACTER KOSERI 50,000 COLONIES/mL ESCHERICHIA COLI    Report Status 12/15/2019 FINAL  Final   Organism ID, Bacteria CITROBACTER KOSERI (A)  Final   Organism ID, Bacteria ESCHERICHIA COLI (A)  Final      Susceptibility   Citrobacter koseri - MIC*    CEFAZOLIN <=4 SENSITIVE Sensitive     CEFTRIAXONE <=0.25 SENSITIVE Sensitive     CIPROFLOXACIN <=0.25 SENSITIVE Sensitive     GENTAMICIN <=1 SENSITIVE Sensitive     IMIPENEM <=0.25 SENSITIVE Sensitive     NITROFURANTOIN 32 SENSITIVE Sensitive     TRIMETH/SULFA <=20 SENSITIVE Sensitive     PIP/TAZO <=4 SENSITIVE Sensitive     * >=100,000 COLONIES/mL CITROBACTER KOSERI   Escherichia coli - MIC*    AMPICILLIN <=2 SENSITIVE Sensitive     CEFAZOLIN <=4 SENSITIVE Sensitive     CEFTRIAXONE <=0.25 SENSITIVE Sensitive     CIPROFLOXACIN <=0.25 SENSITIVE Sensitive     GENTAMICIN <=1 SENSITIVE Sensitive     IMIPENEM <=0.25 SENSITIVE Sensitive     NITROFURANTOIN <=16 SENSITIVE Sensitive     TRIMETH/SULFA <=20 SENSITIVE Sensitive     AMPICILLIN/SULBACTAM <=2 SENSITIVE Sensitive     PIP/TAZO <=4 SENSITIVE Sensitive     * 50,000 COLONIES/mL  ESCHERICHIA COLI  Radiology Studies: No results found.    Fintan Grater T. Blacklake  If 7PM-7AM, please contact night-coverage [email or web address] Password [email or web address] 12/21/2019, 4:10 PM
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
## 2019-12-21 NOTE — Progress Notes (Signed)
Occupational Therapy Session Note  Patient Details  Name: [name] MRN: [id number] Date of Birth: 1928-07-08  Today's Date: 12/21/2019 OT Individual Time: 5670-1410 OT Individual Time Calculation (min): 54 min    Short Term Goals: Week 1:  OT Short Term Goal 1 (Week 1): Pt will maintain dynamic balance during self care tasks at mod A or less for 5 minutes. OT Short Term Goal 2 (Week 1): Pt will perform UB dressing with mod A. OT Short Term Goal 3 (Week 1): Pt will perform functional transfer with assist of 1 person safely.  Skilled Therapeutic Interventions/Progress Updates:    Upon entering the room, pt supine in bed and sleeping soundly on CPAP. Pt also positioned and leaning heavily onto R side in bed. OT assisted pt with repositioning to protect hemiplegic technique. OT removed CPAP and pt needing hand over hand assistance to wash face. Suction and suction toothbrush utilized to manage secretions and oral care as well in an attempt to alert pt. Pt did open eyes and would answer "yes" or "no" 50% of the time when asked a question with increased time. OT checking brief which was dry at this time. Rolling L <> R with total A and supine >sit with total A as well. Static sitting balance on EOB with max - total A secondary to continues lethargy. +2 assistance utilized to stand into stedy and transfer to tilt in space wheelchair. Pt unable to remain awake and continues to close eyes. OT repositioned R UE for protection and donned chair alarm belt for safety. Chair tilted as well. Soft call bell placed under L UE. Pt missing 15 minutes secondary to lethargy.   Therapy Documentation Precautions:  Precautions Precautions: Fall Restrictions Weight Bearing Restrictions: No General:   Vital Signs: Therapy Vitals Temp: 98.3 F (36.8 C) Pulse Rate: 70 Resp: 17 BP: (!) 121/50 Patient Position (if appropriate): Lying Oxygen Therapy SpO2: 97 % O2 Device: CPAP   Therapy/Group:  Individual Therapy  Gypsy Decant 12/21/2019, 7:58 AM

## 2019-12-21 NOTE — Progress Notes (Signed)
[facility] PHYSICAL MEDICINE & REHABILITATION PROGRESS NOTE   Subjective/Complaints: [name] continues to be lethargic. Daughters have been updated. Therapists are concerned about daughters being able to care for her at home. She is progressing very slowly with speech and it will likely be some time before she can start to tolerate an oral diet.   ROS: pt nonverbal this AM- couldn't get ROS Objective:   DG Abd Portable 1V  Result Date: 12/19/2019 CLINICAL DATA:  86 year old female status post NG placement. EXAM: PORTABLE ABDOMEN - 1 VIEW COMPARISON:  Earlier radiograph dated 12/19/2019. FINDINGS: Partially visualized enteric tube with side-port in the proximal stomach and tip in the region of the gastric fundus. No additional interval change. IMPRESSION: Enteric tube with tip in the gastric fundus. Electronically Signed   By: Anner Crete M.D.   On: 12/19/2019 16:09   DG Abd Portable 1V  Result Date: 12/19/2019 CLINICAL DATA:  NG tube placement. EXAM: PORTABLE ABDOMEN - 1 VIEW COMPARISON:  12/13/2019 FINDINGS: There is no visible NG tube in the lower chest or in the abdomen.Bowel gas pattern is normal. Lung bases are clear. No acute bone abnormality. IMPRESSION: No visible NG tube in the lower chest or abdomen. Electronically Signed   By: Lorriane Shire M.D.   On: 12/19/2019 13:43   Recent Labs    12/20/19 0353 12/21/19 0350  WBC 11.1* 10.6*  HGB 10.5* 10.4*  HCT 35.3* 34.2*  PLT 241 227   Recent Labs    12/20/19 0353 12/21/19 0350  NA 148* 138  K 3.3* 3.2*  CL 113* 104  CO2 29 26  GLUCOSE 249* 388*  BUN 26* 22  CREATININE 1.33* 1.33*  CALCIUM 12.6* 11.5*    Intake/Output Summary (Last 24 hours) at 12/21/2019 1031 Last data filed at 12/21/2019 0700 Gross per 24 hour  Intake 2173.57 ml  Output 825 ml  Net 1348.57 ml     Physical Exam: Vital Signs Blood pressure (!) 121/50, pulse 70, temperature 98.3 F (36.8 C), resp. rate 17, height 5' 7"  (1.702 m), weight 78.3  kg, SpO2 97 %.   Constitutional: No distress . Vital signs reviewed. Pt sitting up in manual w/c after standing mod-max assist in cera steady with PT and OT; NAD HEENT:  oral membranes dry and lips dry Neck: supple Cardiovascular: RRR Respiratory/Chest: very gurgly and coarse sounds- like needs to cough up mucus but cannot; a few rhonchi also heard, adequate air movement   GI/Abdomen: soft, NT, ND, (+)BS Ext: no clubbing, cyanosis, or edema Skin:No rashes seenLarge egg-sized lipoma right shoulder.  Neurologic: very lethargic; status is worsening. Motor strength is 3-4/5 in Left, R side no movement in RUE/RLE Sensory exam normal sensation to light touch  in bilateral upper and lower extremities as well as facial DTR's 2++ RUE RLE Right CN VII and poor oral-motor control, decreased gag/cough gurgly sounding- not handling her own secretions-  No evidence of nystagmus Musculoskeletal: no joint pain   Assessment/Plan: 1. Functional deficits secondary to Left brainstem vs subcortical infarct which require 3+ hours per day of interdisciplinary therapy in a comprehensive inpatient rehab setting.  Physiatrist is providing close team supervision and 24 hour management of active medical problems listed below.  Physiatrist and rehab team continue to assess barriers to discharge/monitor patient progress toward functional and medical goals  Care Tool:  Bathing  Bathing activity did not occur: Safety/medical concerns Body parts bathed by patient: Face   Body parts bathed by helper: Right arm, Left arm,  Chest, Abdomen, Front perineal area, Buttocks, Right upper leg, Left upper leg, Right lower leg, Left lower leg     Bathing assist Assist Level: 2 Helpers     Upper Body Dressing/Undressing Upper body dressing   What is the patient wearing?: Pull over shirt    Upper body assist Assist Level: Total Assistance - Patient < 25%    Lower Body Dressing/Undressing Lower body dressing       What is the patient wearing?: Incontinence brief, Pants     Lower body assist Assist for lower body dressing: 2 Helpers     Toileting Toileting Toileting Activity did not occur (Clothing management and hygiene only): N/A (no void or bm)  Toileting assist Assist for toileting: 2 Helpers     Transfers Chair/bed transfer  Transfers assist  Chair/bed transfer activity did not occur: Safety/medical concerns  Chair/bed transfer assist level: Dependent - mechanical lift     Locomotion Ambulation   Ambulation assist   Ambulation activity did not occur: Safety/medical concerns          Walk 10 feet activity   Assist  Walk 10 feet activity did not occur: Safety/medical concerns        Walk 50 feet activity   Assist Walk 50 feet with 2 turns activity did not occur: Safety/medical concerns         Walk 150 feet activity   Assist Walk 150 feet activity did not occur: Safety/medical concerns         Walk 10 feet on uneven surface  activity   Assist Walk 10 feet on uneven surfaces activity did not occur: Safety/medical concerns         Wheelchair     Assist Will patient use wheelchair at discharge?: Yes Type of Wheelchair: Manual Wheelchair activity did not occur: Safety/medical concerns         Wheelchair 50 feet with 2 turns activity    Assist    Wheelchair 50 feet with 2 turns activity did not occur: Safety/medical concerns       Wheelchair 150 feet activity     Assist  Wheelchair 150 feet activity did not occur: Safety/medical concerns       Blood pressure (!) 121/50, pulse 70, temperature 98.3 F (36.8 C), resp. rate 17, height 5' 7"  (1.702 m), weight 78.3 kg, SpO2 97 %.    Medical Problem List and Plan: 1.  Rightt side hemiplegia, dysarthria, +/- aphasia secondary to left brain infarction.  MRI not completed due to pacemaker  -CT demonstrates hypoattenuation in left pons which is consistent with current  presentation---increased right hp and increased difficulty with oro-pharyngeal control             -patient may shower             -ELOS/Goals: 25-30 days/min/mod a             Continue PT, OT, SLP   -team conference today, change to 15/7 2.  Antithrombotics: -DVT/anticoagulation: Subcutaneous Lovenox             -antiplatelet therapy: Aspirin 81 mg daily, Plavix 75 mg daily 3. Pain Management: Continue Tylenol as needed 4. Mood with memory deficits.  Aricept 10 mg nightly             -antipsychotic agents: N/A 5. Neuropsych: This patient is NOT capable of making decisions on her own behalf. 6. Skin/Wound Care: Routine skin checks 7. Fluids/Electrolytes/Nutrition: Routine in and outs.  CMP ordered for tomorrow  a.m. 8.  Hypertension.  Norvasc 2.5 mg daily, Coreg 3.125 mg twice daily.               Monitor with increased mobility Vitals:   12/20/19 2252 12/21/19 0359  BP:  (!) 121/50  Pulse: 77 70  Resp: 19 17  Temp:  98.3 F (36.8 C)  SpO2: 95% 97%   -2/28 permissive HTN for this week given small vessel disease  3/1- BP still elevated- permissive HTN this next week.   3/2: well controlled.  9.  Diabetes mellitus with hyperglycemia.  Hemoglobin A1c 8.2.  Lantus insulin 10 units twice daily, NovoLog 2 units 3 times daily check blood sugars before meals and at bedtime              CBG (last 3)  Recent Labs    12/21/19 0006 12/21/19 0355 12/21/19 0848  GLUCAP 319* 354* 371*   CBGs elevated increase lantus dose 14U BID   2/28 improving control, not eating much though  -reduce lantus while npo tp 6u bid,hold scheduled novolog   3/1- will monitor because changing to cortrak/TFs and will get TF orders via Nutrition.    3/2: Changed to Cortrak orders today. Discussed with Linna Hoff, PA plans to restart TF.   3/3: TF restarted.  10.  CAD with stenting as well as pacemaker.  Continue aspirin and Plavix. 11.  CKD stage IIIb.  Creatinine baseline 1.56.               Cr 1.26 on 2/25  3/1- Cr  1.52- at baseline 12.  Chronic diastolic congestive heart failure.  Monitor for any signs of fluid overload             Daily weights 13.  OSA.  CPAP. 14.  Hyperlipidemia.  Lipitor 15.  Chronic normocytic anemia.  Continue iron supplement             hgb 10 16.  Post stroke dysphagia: Dysphagia #2 thin liquids.  Follow-up speech therapy.  Advance as tolerated  2/27 diet downgraded to D1/nectar given pontine infarct/presentation   -aspiration precautions, close observation   -low threshold to make npo, re-check cxr  2/28 still struggling with mgt of secretions d/t oro-pharyngeal weakness.   -make npo   -start IVF for now   -MBS tomorrow  3/2: see diet/nutrition.  17.  UTI/Citrobacter/EColi: S to . Keflex finish 7d course   -low grade temp yesterday  -change keflex to rocephin1g daily for 2 days  3/1- completed today- wil recheck CBC tomorrow to make sure not increasing after finishing ABX. 3/2: continuing to increase. Will trend tomorrow.  18. Hypernatremia  3/1- needs more fluids- per IM, ordered D5water for now 100cc/hour- will reassess after Na has improved 19. Hypercalcemia  3/1- Ca++ is 13.4- concerning- has been an issue for awhile per IM notes- will get sodium controlled and see if Ca improves as well with fluids- if not, needs additional w/u- some work up already started-  Agree with PTH, PTHrP, TSH, 25vitD, 1,25VitD, SPEP- many of which are already being done.   3/2: will repeat tomorrow.   3/3 Ca improved this morning. Appreciate nephrology involvement: Sensipar 61m BID started.      LOS: 7 days A FACE TO FACE EVALUATION WAS PERFORMED  KClide DeutscherRaulkar 12/21/2019, 10:31 AM

## 2019-12-21 NOTE — Progress Notes (Signed)
Speech Language Pathology Daily Session Note  Patient Details  Name: NYLAH BUTKUS MRN: [id number] Date of Birth: 1928-05-25  Today's Date: 12/21/2019 SLP Individual Time: 2811-8867 SLP Individual Time Calculation (min): 57 min  Short Term Goals: Week 1: SLP Short Term Goal 1 (Week 1): Pt will consume current diet with minimal overt s/sx aspiration and demonstrate efficient mastication and oral clearance with timely AP transit of boluses with Mod A cues. SLP Short Term Goal 2 (Week 1): Pt will sustain attention to tasks for 15 minutes with Mod A verbal/visual cues. SLP Short Term Goal 3 (Week 1): Pt will demonstrate ability to problem solve during basic functional tasks with Max A verbal/visual cues. SLP Short Term Goal 4 (Week 1): Pt will demonstrate awareness of errors during functional tasks with Max A multimodal cues. SLP Short Term Goal 5 (Week 1): Pt will increase speech intelligibility to 75% at the phrase level with Mod A verbal/visual cues for use of increased vocal intensity and overarticulation.  Skilled Therapeutic Interventions: Pt was seen for skilled ST targeting dysphagia and cognitive goals. Upon arrival pt noted to have pooled secretions in her oral cavity and required SLP's interventions to manage with suctioning. Although conclusions regarding swallow initiation are limited at bedside level observations, however SLP did not observe or feel (given palpation) any hyolaryngeal movement or attempt to trigger swallow sequence at rest or during PO trials. Pt with poor bolus awareness and very limited lingual manipulation of ice chips, no swallow initiation, ultimately requiring oral suctioning. Recommend continue NPO with alternative means of nutrition. She does continue to follow commands to assist with mobilizing secretions into oral cavity with suctioning with use of throat clear and voicing "ah" with Max A multimodal cues. During cognitive interventions, SLP facilitated completion  of basic self-care tasks such as oral care, putting on lotion, washing face, etc. With overall Total A hand over hand assistance. Pt with fleeting arousal for no longer than 1.5 minute intervals with Max A multimodal cues and stimulation. Pt independently oriented to hospital and month, however overall Max A verbal and visual cues were required for sustained attention to both verbal and functional tasks. Pt with reduced ability to discriminate colors (black and red) during attempt to sort cards. Pt left sitting in tilt in space chair with alarm set and needs within reach. Continue per current plan of care.       Pain Pain Assessment Pain Scale: Faces Faces Pain Scale: No hurt  Therapy/Group: Individual Therapy  Arbutus Leas 12/21/2019, 10:07 AM

## 2019-12-22 ENCOUNTER — Inpatient Hospital Stay (HOSPITAL_COMMUNITY): Payer: Medicare Other | Admitting: Physical Therapy

## 2019-12-22 ENCOUNTER — Inpatient Hospital Stay (HOSPITAL_COMMUNITY): Payer: Medicare Other | Admitting: Speech Pathology

## 2019-12-22 ENCOUNTER — Institutional Professional Consult (permissible substitution): Payer: Medicare Other | Admitting: Neurology

## 2019-12-22 ENCOUNTER — Inpatient Hospital Stay (HOSPITAL_COMMUNITY): Payer: Medicare Other | Admitting: Occupational Therapy

## 2019-12-22 LAB — CBC
HCT: 30.6 % — ABNORMAL LOW (ref 36.0–46.0)
Hemoglobin: 9.5 g/dL — ABNORMAL LOW (ref 12.0–15.0)
MCH: 28.7 pg (ref 26.0–34.0)
MCHC: 31 g/dL (ref 30.0–36.0)
MCV: 92.4 fL (ref 80.0–100.0)
Platelets: 222 10*3/uL (ref 150–400)
RBC: 3.31 MIL/uL — ABNORMAL LOW (ref 3.87–5.11)
RDW: 12.3 % (ref 11.5–15.5)
WBC: 11.3 10*3/uL — ABNORMAL HIGH (ref 4.0–10.5)
nRBC: 0 % (ref 0.0–0.2)

## 2019-12-22 LAB — RENAL FUNCTION PANEL
Albumin: 2.4 g/dL — ABNORMAL LOW (ref 3.5–5.0)
Anion gap: 7 (ref 5–15)
BUN: 27 mg/dL — ABNORMAL HIGH (ref 8–23)
CO2: 28 mmol/L (ref 22–32)
Calcium: 11.3 mg/dL — ABNORMAL HIGH (ref 8.9–10.3)
Chloride: 108 mmol/L (ref 98–111)
Creatinine, Ser: 1.49 mg/dL — ABNORMAL HIGH (ref 0.44–1.00)
GFR calc Af Amer: 36 mL/min — ABNORMAL LOW (ref >60)
GFR calc non Af Amer: 31 mL/min — ABNORMAL LOW (ref >60)
Glucose, Bld: 397 mg/dL — ABNORMAL HIGH (ref 70–99)
Phosphorus: 1.9 mg/dL — ABNORMAL LOW (ref 2.5–4.6)
Potassium: 4.4 mmol/L (ref 3.5–5.1)
Sodium: 143 mmol/L (ref 135–145)

## 2019-12-22 LAB — GLUCOSE, CAPILLARY
Glucose-Capillary: 298 mg/dL — ABNORMAL HIGH (ref 70–99)
Glucose-Capillary: 355 mg/dL — ABNORMAL HIGH (ref 70–99)
Glucose-Capillary: 335 mg/dL — ABNORMAL HIGH (ref 70–99)
Glucose-Capillary: 315 mg/dL — ABNORMAL HIGH (ref 70–99)
Glucose-Capillary: 250 mg/dL — ABNORMAL HIGH (ref 70–99)
Glucose-Capillary: 244 mg/dL — ABNORMAL HIGH (ref 70–99)

## 2019-12-22 LAB — MAGNESIUM: Magnesium: 2.1 mg/dL (ref 1.7–2.4)

## 2019-12-22 MED ORDER — INSULIN ASPART 100 UNIT/ML ~~LOC~~ SOLN
3.0000 [IU] | SUBCUTANEOUS | Status: DC
  Administered 2019-12-22 – 2019-12-24 (×12): 3 [IU] via SUBCUTANEOUS

## 2019-12-22 MED ORDER — SODIUM CHLORIDE 0.45 % IV SOLN: INTRAVENOUS | Status: DC

## 2019-12-22 MED ORDER — INSULIN ASPART 100 UNIT/ML ~~LOC~~ SOLN
0.0000 [IU] | SUBCUTANEOUS | Status: DC
  Administered 2019-12-22: 5 [IU] via SUBCUTANEOUS
  Administered 2019-12-22: 11 [IU] via SUBCUTANEOUS
  Administered 2019-12-22: 5 [IU] via SUBCUTANEOUS
  Administered 2019-12-22: 11 [IU] via SUBCUTANEOUS
  Administered 2019-12-23 (×4): 8 [IU] via SUBCUTANEOUS
  Administered 2019-12-23: 3 [IU] via SUBCUTANEOUS
  Administered 2019-12-23: 8 [IU] via SUBCUTANEOUS
  Administered 2019-12-24: 3 [IU] via SUBCUTANEOUS
  Administered 2019-12-24: 9 [IU] via SUBCUTANEOUS
  Administered 2019-12-24: 3 [IU] via SUBCUTANEOUS
  Administered 2019-12-24: 8 [IU] via SUBCUTANEOUS
  Administered 2019-12-24: 11 [IU] via SUBCUTANEOUS
  Administered 2019-12-24: 8 [IU] via SUBCUTANEOUS
  Administered 2019-12-25 (×2): 5 [IU] via SUBCUTANEOUS
  Administered 2019-12-25: 3 [IU] via SUBCUTANEOUS
  Administered 2019-12-25 (×3): 8 [IU] via SUBCUTANEOUS
  Administered 2019-12-26: 3 [IU] via SUBCUTANEOUS
  Administered 2019-12-26 (×3): 5 [IU] via SUBCUTANEOUS
  Administered 2019-12-26 (×2): 3 [IU] via SUBCUTANEOUS
  Administered 2019-12-27: 5 [IU] via SUBCUTANEOUS
  Administered 2019-12-27: 3 [IU] via SUBCUTANEOUS
  Administered 2019-12-27: 5 [IU] via SUBCUTANEOUS
  Administered 2019-12-27 – 2019-12-28 (×4): 3 [IU] via SUBCUTANEOUS
  Administered 2019-12-28: 2 [IU] via SUBCUTANEOUS
  Administered 2019-12-28: 5 [IU] via SUBCUTANEOUS
  Administered 2019-12-28: 2 [IU] via SUBCUTANEOUS
  Administered 2019-12-29: 5 [IU] via SUBCUTANEOUS
  Administered 2019-12-29 (×2): 3 [IU] via SUBCUTANEOUS
  Administered 2019-12-29: 2 [IU] via SUBCUTANEOUS
  Administered 2019-12-29: 5 [IU] via SUBCUTANEOUS
  Administered 2019-12-30 – 2019-12-31 (×5): 3 [IU] via SUBCUTANEOUS
  Administered 2019-12-31: 2 [IU] via SUBCUTANEOUS
  Administered 2019-12-31 (×2): 3 [IU] via SUBCUTANEOUS
  Administered 2019-12-31: 5 [IU] via SUBCUTANEOUS
  Administered 2020-01-01: 2 [IU] via SUBCUTANEOUS
  Administered 2020-01-01: 3 [IU] via SUBCUTANEOUS
  Administered 2020-01-01 (×2): 2 [IU] via SUBCUTANEOUS
  Administered 2020-01-01: 3 [IU] via SUBCUTANEOUS
  Administered 2020-01-01: 2 [IU] via SUBCUTANEOUS
  Administered 2020-01-02: 3 [IU] via SUBCUTANEOUS
  Administered 2020-01-02 (×2): 2 [IU] via SUBCUTANEOUS
  Administered 2020-01-02: 3 [IU] via SUBCUTANEOUS
  Administered 2020-01-02 (×2): 2 [IU] via SUBCUTANEOUS
  Administered 2020-01-03: 3 [IU] via SUBCUTANEOUS
  Administered 2020-01-04 – 2020-01-05 (×2): 5 [IU] via SUBCUTANEOUS
  Administered 2020-01-05: 8 [IU] via SUBCUTANEOUS
  Administered 2020-01-05: 2 [IU] via SUBCUTANEOUS
  Administered 2020-01-05 – 2020-01-06 (×3): 5 [IU] via SUBCUTANEOUS
  Administered 2020-01-06: 3 [IU] via SUBCUTANEOUS
  Administered 2020-01-06: 2 [IU] via SUBCUTANEOUS
  Administered 2020-01-06 – 2020-01-07 (×2): 3 [IU] via SUBCUTANEOUS
  Administered 2020-01-07: 5 [IU] via SUBCUTANEOUS

## 2019-12-22 NOTE — Plan of Care (Signed)
  Problem: RH BLADDER ELIMINATION Goal: RH STG MANAGE BLADDER WITH ASSISTANCE Description: STG Manage Bladder With Moderate Assistance Outcome: Not Progressing; incontinence   Problem: RH BOWEL ELIMINATION Goal: RH STG MANAGE BOWEL WITH ASSISTANCE Description: STG Manage Bowel with Moderate Assistance. Outcome: Not Progressing  incontinence

## 2019-12-22 NOTE — Progress Notes (Signed)
Speech Language Pathology Weekly Progress and Session Note  Patient Details  Name: [name] MRN: [id number] Date of Birth: Aug 11, 1928  Beginning of progress report period: December 15, 2019 End of progress report period: December 22, 2019  Today's Date: 12/22/2019 SLP Individual Time: 6568-1275 SLP Individual Time Calculation (min): 56 min  Short Term Goals: Week 1: SLP Short Term Goal 1 (Week 1): Pt will consume current diet with minimal overt s/sx aspiration and demonstrate efficient mastication and oral clearance with timely AP transit of boluses with Mod A cues. SLP Short Term Goal 1 - Progress (Week 1): Not met SLP Short Term Goal 2 (Week 1): Pt will sustain attention to tasks for 15 minutes with Mod A verbal/visual cues. SLP Short Term Goal 2 - Progress (Week 1): Progressing toward goal SLP Short Term Goal 3 (Week 1): Pt will demonstrate ability to problem solve during basic functional tasks with Max A verbal/visual cues. SLP Short Term Goal 3 - Progress (Week 1): Progressing toward goal SLP Short Term Goal 4 (Week 1): Pt will demonstrate awareness of errors during functional tasks with Max A multimodal cues. SLP Short Term Goal 4 - Progress (Week 1): Progressing toward goal SLP Short Term Goal 5 (Week 1): Pt will increase speech intelligibility to 75% at the phrase level with Mod A verbal/visual cues for use of increased vocal intensity and overarticulation. SLP Short Term Goal 5 - Progress (Week 1): Not met    New Short Term Goals: Week 2: SLP Short Term Goal 1 (Week 2): Pt will consume trials of ice chips, demonstrating inititaion of swallow sequence in 1/5 opportunities with Max A multimodal cues. SLP Short Term Goal 2 (Week 2): Pt will sustain attention to functional familiar tasks for 2 minute intervals with Max A multimodal cues. SLP Short Term Goal 3 (Week 2): Pt will demonstrate ability to problem solve during basic functional tasks with Max A verbal/visual cues. SLP  Short Term Goal 4 (Week 2): Pt will demonstrate verbal and physical initiation in 50% opportunities with Max A multimodal cues. SLP Short Term Goal 5 (Week 2): Pt orient to place and time with Min A verbal/visual cues for use of aids. SLP Short Term Goal 6 (Week 2): Pt will respond to Mod A multimodal cues for use of increased vocal intensity to increase speech intelligibility at the word or short phrase level.  Weekly Progress Updates: Pt has made very slow gains this week due to medical instability and new acute CVA identified on day of admission to CIR, therefore she met 0 out of 5 short term goals this reporting period. Pt is currently Max-Total assist for due to severe oropharyngeal dysphagia, dysarthria, and cognitive impairments impacting arousal, initiation, awareness, recall, and problem solving. Pt is currently NPO due to severe oral phase deficits and overall limited initiation of swallow sequence, demonstrated both during MBSS and at bedside. Pt education is ongoing and family education was started early this week with pt's daughter regarding MBSS and cognition. Pt would continue to benefit from skilled ST while inpatient in order to maximize functional independence and reduce burden of care prior to discharge. Anticipate that pt will need 24/7 supervision at discharge in addition to [place] follow up at next level of care.      Intensity: Minumum of 1-2 x/day, 30 to 90 minutes Frequency: 3 to 5 out of 7 days Duration/Length of Stay: 01/06/20 Treatment/Interventions: Patient/family education;Cognitive remediation/compensation;Cueing hierarchy;Dysphagia/aspiration precaution training;Functional tasks;Environmental controls;Internal/external aids;Speech/Language facilitation   Daily Session  Skilled Therapeutic Interventions: Pt was seen for skilled ST targeting dysphagia and cognitive goal. Pt noted to have decrease in pooled secretions in oral cavity upon arrival, in comparison to yesterday.  She is still unable to volitionally trigger swallow sequence, and minimal ability to trigger swallow sequence during PO trials as well. Pt still with very poor bolus awareness, severely impaired mastication and AP transit of ice chip trials. 5/6 trials required suctioning to remove PO, due to pt's inability to trigger swallow sequence. Pt required hand over hand assistance to complete oral care via suction and apply lip balm. Max A multimodal cues also required for initiation and arousal for 1 minute intervals throughout session. Total A required for problem solving during basic sorting tasks, as well as recall of personally relevant information such as birth date and address. Pt did demonstrate verbal initiation in response to SLP's questions in ~50% opportunities with Max A multimodal cues. Pt left laying semi-reclined in bed with alarm set and needs within reach. Continue per current plan of care.        Pain Pain Assessment Pain Scale: 0-10 Pain Score: 0-No pain  Therapy/Group: Individual Therapy  Arbutus Leas 12/22/2019, 7:35 AM

## 2019-12-22 NOTE — Progress Notes (Signed)
PROGRESS NOTE  SKYLEY GRANDMAISON ZJQ:[id number] DOB: May 31, 1928   PCP: [name], MD  DOA: 12/14/2019 LOS: 8  Brief Narrative / Interim history: 86 y.o.femaleHTN, heart block s/p PM, DM type 2, OSA on CPAP, CAD s/p PCI, CVA recently hospitalized (2/16-2/24) with rt sided hemiparesis and dysphagia, and discharged to CIR. Hospital course complicated by UTI with Citrobacter and E.Coli treated with Meropenem and transitioned toKelflex.  TRH consulted on 3/1 for "medical management". On 2/27 she was downgraded from dysphagia 2 diet to a dysphagia 1 diet, and on 2/28 made n.p.o. due to difficulty in handling secretions. Current plan is for placement of NGT tube. Patient was switched to Rocephin to complete antibiotic course for UTI.    Labs significant for WBC 10.7, hemoglobin 10.8 sodium 154, BUN 32, creatinine 1.52, andcalcium 13.4  Nephrology consulted as well.  Subjective: No major events overnight or this morning.  No complaint this morning.  CBG remains elevated in 200s to 300s.   Objective: Vitals:   12/21/19 2100 12/22/19 0433 12/22/19 0500 12/22/19 1403  BP:  (!) 136/49  (!) 148/62  Pulse: 70 73  70  Resp: 18 19  18   Temp:  98.4 F (36.9 C)  (!) 97.1 F (36.2 C)  TempSrc:      SpO2: 95% 97%  100%  Weight:   79.8 kg   Height:        Intake/Output Summary (Last 24 hours) at 12/22/2019 1538 Last data filed at 12/22/2019 1100 Gross per 24 hour  Intake 0 ml  Output --  Net 0 ml   Filed Weights   12/20/19 0500 12/21/19 0404 12/22/19 0500  Weight: 77 kg 78.3 kg 79.8 kg    Examination: GENERAL: No apparent distress.  Nontoxic. HEENT: MMM.  On TF via cortrakck NECK: Supple.  No apparent JVD.  RESP:  No IWOB.  Fair aeration bilaterally. CVS:  RRR. Heart sounds normal.  ABD/GI/GU: Bowel sounds present. Soft. Non tender.  MSK/EXT:  Moves extremities. No apparent deformity. No edema.  SKIN: no apparent skin lesion or wound NEURO: Awake and follows command.  Some  degree of aphasia and right hemiparesis.  Procedures:  None  Assessment & Plan: Hypercalcemia: Likely a combination of dehydration, immobilization and primary hyperparathyroidism -Nephrology managing-started Sensipar 30 mg twice daily -Also on IV fluid now.  Hyponatremia: Resolved with D5 and free water -Discontinued D5 in the setting of hyperglycemia -Continue free water  Uncontrolled diabetes with hyperglycemia: has been getting SSI ACHS while on TF Recent Labs    12/22/19 0112 12/22/19 0631 12/22/19 0949  GLUCAP 298* 355* 335*  -Change SSI-moderate to q4h while on TF -NovoLog 3 units every 4 hours -Continue Lantus 6 units twice daily -Continue statin  Subacute CVA with right-sided hemiparesis -Per primary-on statin, Plavix and aspirin -PT/OT/SLP  Dysphagia: Downgraded from dysphagia to 2 dysphagia 1, then to NPO -Continue tube feed  Hypokalemia: Resolved.  AKI on CKD-3b: prerenal azotemia partly due to dehydration in the setting of poor p.o. intake.  Renal function is slightly worse today. -Free water and tube feed as above -Nephrology started IV fluid.  Urinary tract infection: Urine culture with E. coli and Citrobacter. -Completed antibiotics with meropenem> Keflex> ceftriaxone  Anemia of chronic disease: H&H relatively stable -Continue monitoring -Continue iron  Essential hypertension: Normotensive -Continue current regimen  History of complete AVB s/p PPM     Nutrition Problem: Inadequate oral intake Etiology: inability to eat, dysphagia  Signs/Symptoms: NPO status  Interventions: Tube feeding  DVT prophylaxis: Subcu Lovenox Code Status: Full code Family Communication: Patient and/or RN. Available if any question. Disposition: Per primary  Consultants: We are   Microbiology summarized: None  Sch Meds:  Scheduled Meds: . amLODipine  2.5 mg Per Tube Daily  . aspirin  81 mg Per J Tube Daily  . atorvastatin  80 mg Per Tube q morning - 10a   . brimonidine  1 drop Both Eyes BID  . carvedilol  3.125 mg Per Tube BID  . chlorhexidine  15 mL Mouth Rinse BID  . cinacalcet  30 mg Oral BID WC  . clopidogrel  75 mg Per Tube Daily  . donepezil  10 mg Per Tube QHS  . dorzolamide  1 drop Both Eyes TID  . enoxaparin (LOVENOX) injection  30 mg Subcutaneous Daily  . feeding supplement (PRO-STAT SUGAR FREE 64)  30 mL Per Tube Daily  . ferrous sulfate  300 mg Oral Q breakfast  . free water  140 mL Per Tube Q6H  . insulin aspart  0-15 Units Subcutaneous Q4H  . insulin aspart  3 Units Subcutaneous Q4H  . insulin glargine  6 Units Subcutaneous BID  . latanoprost  1 drop Both Eyes QHS  . mouth rinse  15 mL Mouth Rinse q12n4p  . pantoprazole sodium  40 mg Per Tube QAC breakfast  . polyethylene glycol  17 g Per Tube BID  . senna-docusate  1 tablet Per Tube BID  . sodium chloride flush  10-40 mL Intracatheter Q12H   Continuous Infusions: . sodium chloride 75 mL/hr at 12/22/19 1312  . feeding supplement (JEVITY 1.2 CAL) 1,000 mL (12/22/19 1317)   PRN Meds:.acetaminophen **OR** acetaminophen (TYLENOL) oral liquid 160 mg/5 mL **OR** acetaminophen, ipratropium-albuterol, ondansetron, sodium chloride flush, sorbitol  Antimicrobials: Anti-infectives (From admission, onward)   Start     Dose/Rate Route Frequency Ordered Stop   12/18/19 0945  cefTRIAXone (ROCEPHIN) 1 g in sodium chloride 0.9 % 100 mL IVPB     1 g 200 mL/hr over 30 Minutes Intravenous Every 24 hours 12/18/19 0932 12/19/19 1050   12/14/19 2000  meropenem (MERREM) 1 g in sodium chloride 0.9 % 100 mL IVPB  Status:  Discontinued     1 g 200 mL/hr over 30 Minutes Intravenous Every 12 hours 12/14/19 1619 12/14/19 1639   12/14/19 2000  cephALEXin (KEFLEX) capsule 500 mg  Status:  Discontinued     500 mg Oral Every 12 hours 12/14/19 1639 12/18/19 0932       I have personally reviewed the following labs and images: CBC: Recent Labs  Lab 12/18/19 0951 12/19/19 0434 12/20/19 0353  12/21/19 0350 12/22/19 0648  WBC 11.3* 10.7* 11.1* 10.6* 11.3*  NEUTROABS  --   --  7.4 6.7  --   HGB 12.3 10.8* 10.5* 10.4* 9.5*  HCT 40.3 36.0 35.3* 34.2* 30.6*  MCV 95.5 97.0 97.0 95.0 92.4  PLT 279 277 241 227 222   BMP &GFR Recent Labs  Lab 12/18/19 0951 12/18/19 0951 12/19/19 0434 12/19/19 1308 12/20/19 0353 12/21/19 0350 12/22/19 0648  NA 151*  --  154*  --  148* 138 143  K 4.0  --  3.6  --  3.3* 3.2* 4.4  CL 114*  --  115*  --  113* 104 108  CO2 31  --  31  --  29 26 28   GLUCOSE 207*  --  148*  --  249* 388* 397*  BUN 31*  --  32*  --  26* 22 27*  CREATININE 1.52*  --  1.52*  --  1.33* 1.33* 1.49*  CALCIUM 13.7*   < > 13.4* 13.8* 12.6* 11.5* 11.3*  MG  --   --   --   --   --   --  2.1  PHOS  --   --   --   --   --   --  1.9*   < > = values in this interval not displayed.   Estimated Creatinine Clearance: 27.8 mL/min (A) (by C-G formula based on SCr of 1.49 mg/dL (H)). Liver & Pancreas: Recent Labs  Lab 12/18/19 0951 12/22/19 0648  AST 27  --   ALT 27  --   ALKPHOS 69  --   BILITOT 0.6  --   PROT 6.9  --   ALBUMIN 3.2* 2.4*   No results for input(s): LIPASE, AMYLASE in the last 168 hours. No results for input(s): AMMONIA in the last 168 hours. Diabetic: No results for input(s): HGBA1C in the last 72 hours. Recent Labs  Lab 12/21/19 1716 12/21/19 2052 12/22/19 0112 12/22/19 0631 12/22/19 0949  GLUCAP 314* 248* 298* 355* 335*   Cardiac Enzymes: No results for input(s): CKTOTAL, CKMB, CKMBINDEX, TROPONINI in the last 168 hours. No results for input(s): PROBNP in the last 8760 hours. Coagulation Profile: No results for input(s): INR, PROTIME in the last 168 hours. Thyroid Function Tests: No results for input(s): TSH, T4TOTAL, FREET4, T3FREE, THYROIDAB in the last 72 hours. Lipid Profile: No results for input(s): CHOL, HDL, LDLCALC, TRIG, CHOLHDL, LDLDIRECT in the last 72 hours. Anemia Panel: No results for input(s): VITAMINB12, FOLATE, FERRITIN,  TIBC, IRON, RETICCTPCT in the last 72 hours. Urine analysis:    Component Value Date/Time   COLORURINE YELLOW 12/12/2019 1030   APPEARANCEUR HAZY (A) 12/12/2019 1030   LABSPEC 1.015 12/12/2019 1030   PHURINE 6.0 12/12/2019 1030   GLUCOSEU NEGATIVE 12/12/2019 1030   HGBUR NEGATIVE 12/12/2019 1030   BILIRUBINUR NEGATIVE 12/12/2019 1030   KETONESUR NEGATIVE 12/12/2019 1030   PROTEINUR NEGATIVE 12/12/2019 1030   UROBILINOGEN 0.2 08/07/2015 1826   NITRITE NEGATIVE 12/12/2019 1030   LEUKOCYTESUR SMALL (A) 12/12/2019 1030   Sepsis Labs: Invalid input(s): PROCALCITONIN, [place]  Microbiology: No results found for this or any previous visit (from the past 240 hour(s)).  Radiology Studies: No results found.    Tibor Lemmons T. Tyr[place]  If 7PM-7AM, please contact night-coverage [email or web address] Password [facility] 12/22/2019, 3:38 PM

## 2019-12-22 NOTE — Progress Notes (Signed)
[facility] Progress Note   86 y.o.femaleHTN, heart block s/p PM, DM type 2, OSA on CPAP, CASHD s/p PCI, CVA recently hospitalized with rt sided hemiparesis with hospital course complicated by UTI with Citrobacter and E.Coli treated with Meropenem ->Kelflex. Was on a dysphagia diet but has been having a large amount of secretions and likely to need a NGT for feeds. Ca has been on high side of normal but after a few days it is currently in the 13's.   Assessment/ Plan:   1. Hypercalcemia - continues to increase and has history of periodic elevation which is more consistent with immobilization + prerenal azotemia although she certainly merits a full workup. I do not see any Calcium or VitD administration.   - She has primary hyperparathyroidism; should have a low PTH with the level of hypercalcemia she has. - Started Sensipar 36m BID (3/3)   2. AKI - looks dry -> 1/2 NS x1 L only 3. Hypernatremia with a free water deficit of 3.9L - nice response. 4. CHF with EF 50-55% - appears compensated 5. Anemia 6. DM  Subjective:   Has had high residual on bladder scan w/  in & out cath on 3/3. She denies f/c/n/v.   Objective:   BP (!) 136/49 (BP Location: Left Arm)   Pulse 73   Temp 98.4 F (36.9 C)   Resp 19   Ht 5' 7"  (1.702 m)   Wt 79.8 kg   SpO2 97%   BMI 27.55 kg/m   Intake/Output Summary (Last 24 hours) at 12/22/2019 1259 Last data filed at 12/22/2019 1100 Gross per 24 hour  Intake 0 ml  Output 550 ml  Net -550 ml   Weight change: 1.5 kg  Physical Exam: General appearance:alert and cooperative Resp:diminished breath soundsbibasilar and bilaterally Cardio:S1, S2 normal GQZ:ESPQ non-tender; bowel sounds normal; no masses, no organomegaly Extremities:[place] Pulses:2+ and symmetric  Imaging: No results found.  Labs: BMET Recent Labs  Lab 12/18/19 0951 12/19/19 0434 12/19/19 1308 12/20/19 0353 12/21/19 0350 12/22/19 0648  NA 151* 154*   --  148* 138 143  K 4.0 3.6  --  3.3* 3.2* 4.4  CL 114* 115*  --  113* 104 108  CO2 31 31  --  29 26 28   GLUCOSE 207* 148*  --  249* 388* 397*  BUN 31* 32*  --  26* 22 27*  CREATININE 1.52* 1.52*  --  1.33* 1.33* 1.49*  CALCIUM 13.7* 13.4* 13.8* 12.6* 11.5* 11.3*  PHOS  --   --   --   --   --  1.9*   CBC Recent Labs  Lab 12/19/19 0434 12/20/19 0353 12/21/19 0350 12/22/19 0648  WBC 10.7* 11.1* 10.6* 11.3*  NEUTROABS  --  7.4 6.7  --   HGB 10.8* 10.5* 10.4* 9.5*  HCT 36.0 35.3* 34.2* 30.6*  MCV 97.0 97.0 95.0 92.4  PLT 277 241 227 222    Medications:    . amLODipine  2.5 mg Per Tube Daily  . aspirin  81 mg Per J Tube Daily  . atorvastatin  80 mg Per Tube q morning - 10a  . brimonidine  1 drop Both Eyes BID  . carvedilol  3.125 mg Per Tube BID  . chlorhexidine  15 mL Mouth Rinse BID  . cinacalcet  30 mg Oral BID WC  . clopidogrel  75 mg Per Tube Daily  . donepezil  10 mg Per Tube QHS  . dorzolamide  1 drop Both Eyes  TID  . enoxaparin (LOVENOX) injection  30 mg Subcutaneous Daily  . feeding supplement (PRO-STAT SUGAR FREE 64)  30 mL Per Tube Daily  . ferrous sulfate  300 mg Oral Q breakfast  . free water  140 mL Per Tube Q6H  . insulin aspart  0-15 Units Subcutaneous Q4H  . insulin aspart  3 Units Subcutaneous Q4H  . insulin glargine  6 Units Subcutaneous BID  . latanoprost  1 drop Both Eyes QHS  . mouth rinse  15 mL Mouth Rinse q12n4p  . pantoprazole sodium  40 mg Per Tube QAC breakfast  . polyethylene glycol  17 g Per Tube BID  . senna-docusate  1 tablet Per Tube BID  . sodium chloride flush  10-40 mL Intracatheter Q12H      Otelia Santee, MD 12/22/2019, 12:59 PM  86 y.o.femaleHTN, heart block s/p PM, DM type 2, OSA on CPAP, CASHD s/p PCI, CVA recently hospitalized with rt sided hemiparesis with hospital course complicated by UTI with Citrobacter and E.Coli treated with Meropenem ->Kelflex. Was on a dysphagia diet but has been having a large amount of secretions  and likely to need a NGT for feeds. Ca has been on high side of normal but after a few days it is currently in the 13's.

## 2019-12-22 NOTE — Progress Notes (Signed)
Occupational Therapy Session Note  Patient Details  Name: [name] MRN: [id number] Date of Birth: 11-Sep-1928  Today's Date: 12/22/2019 OT Individual Time: 1100-1145 OT Individual Time Calculation (min): 45 min    Short Term Goals: Week 1:  OT Short Term Goal 1 (Week 1): Pt will maintain dynamic balance during self care tasks at mod A or less for 5 minutes. OT Short Term Goal 2 (Week 1): Pt will perform UB dressing with mod A. OT Short Term Goal 3 (Week 1): Pt will perform functional transfer with assist of 1 person safely.  Skilled Therapeutic Interventions/Progress Updates:    1:1 Pt in bed awake. Focus on visual tracking stimulus with max mutlimodal cues during self care retraining. HOB elevated in highest position to don shirt. Pt required hand over hand assistance to don shirt. Pt with strong left visual bias and right lateral lean. Pt able to make minor postural positioning to come back towards midline but unable to maintain. Assisted pt to EOB with total A. Focus on upright postural control with multimodal cues. Pt requires brief moments of contact guard but then with fatigue requires mod A. Pt with prominent forward posture. Sustained attention with mod A while Ot assisted with threading pants and donning socks/shoes. Attempted to stand in STEDY with +2 but unable to achieve upright posture. Pt then became incontinent of bowel. Returned to supine and performed hygiene and finished LB dressing with total A. Pt began more lethargic and fatigued. Pt lifted with hoyer lift to the chair due to fatigue. Pt resting in the recliner. Pt with no verbalizations other than grimaces with movement of right Ue at times.   Therapy Documentation Precautions:  Precautions Precautions: Fall Restrictions Weight Bearing Restrictions: No General: General OT Amount of Missed Time: 15 Minutes Vital Signs: Therapy Vitals Temp: (!) 97.1 F (36.2 C) Pulse Rate: 70 Resp: 18 BP: (!) 148/62 Patient  Position (if appropriate): Lying Oxygen Therapy SpO2: 100 % O2 Device: Room Air Pain: grimances with some assistance with right Ue position. Relief with rest.  ADL:   Vision   Perception    Praxis   Exercises:   Other Treatments:     Therapy/Group: Individual Therapy  Willeen Cass [facility] 12/22/2019, 3:54 PM

## 2019-12-22 NOTE — Progress Notes (Signed)
Physical Therapy Session Note  Patient Details  Name: [name] MRN: [id number] Date of Birth: 08-06-1928  Today's Date: 12/22/2019 PT Individual Time: 1534-1630 PT Individual Time Calculation (min): 56 min   Short Term Goals: Week 1:  PT Short Term Goal 1 (Week 1): pt will remain out of bed 1 hour between therapies. PT Short Term Goal 2 (Week 1): Pt will perform bed<>WC transfer with max assist of 1 person PT Short Term Goal 3 (Week 1): Pt will attend to R side 25 % the time with moderate cues in functional tasks. PT Short Term Goal 4 (Week 1): Pt will initiate WC mobility  Skilled Therapeutic Interventions/Progress Updates:   Pt received sitting in WC, alert. Transported to rehab gym in [facility]. Pt nodding no to all orientation of place questions for home v hotel v hospital. [facility] transfer to mat table. Sitting balance EOB x 12 minutes with min-mod assist to correct R lateral lean. Multimodal cues for UE placement to force WB through BUE as well as tactile and visual feedback for postural control to prevent severe flexed posture. Sit<>stand in stedy x 3 with max assist +2. In standing, pt unable to sustain hip extension resulting in severe flexed posture. Postural control/neuromuslcular re-education to sustain neutral posture in semistand position in stedy 5 bout x 3 min each with mod fading to max assist due to fatigue. After final standing bout in stedy, pt noted to have been extremely incontinent.   Pt returned to room and performed maximove transfer to bed. Rolling R and L with max assist +2 to achieve full side lying x3 Bilaterally in order to PT to doff soiled clothes, perform pericare, and don clean breif.   pt left supine in bed with call bell in reach and all needs met.    Throughout session pt was alert and aroused 90% of the time with only min cues/stimuli to keep eyes open while performing bed mobility with pericare.       Therapy Documentation Precautions:   Precautions Precautions: Fall Restrictions Weight Bearing Restrictions: No    Vital Signs: Therapy Vitals Temp: (!) 97.1 F (36.2 C) Pulse Rate: 70 Resp: 18 BP: (!) 148/62 Patient Position (if appropriate): Lying Oxygen Therapy SpO2: 100 % O2 Device: Room Air Pain: Faces: none.    Therapy/Group: Individual Therapy  Lorie Phenix 12/22/2019, 4:30 PM

## 2019-12-22 NOTE — Progress Notes (Signed)
CPAP held. Patient has a nasal feeding tube.

## 2019-12-22 NOTE — Progress Notes (Signed)
[facility] PHYSICAL MEDICINE & REHABILITATION PROGRESS NOTE   Subjective/Complaints: [name] continues to be lethargic, but is responsive. Appears to be improving slightly.  She nods no to pain.  NGT in place.  WBC slightly elevated this morning. Cr also increased and glucose is very elevated.   ROS: pt nonverbal this AM- couldn't get ROS Objective:   No results found. Recent Labs    12/21/19 0350 12/22/19 0648  WBC 10.6* 11.3*  HGB 10.4* 9.5*  HCT 34.2* 30.6*  PLT 227 222   Recent Labs    12/21/19 0350 12/22/19 0648  NA 138 143  K 3.2* 4.4  CL 104 108  CO2 26 28  GLUCOSE 388* 397*  BUN 22 27*  CREATININE 1.33* 1.49*  CALCIUM 11.5* 11.3*    Intake/Output Summary (Last 24 hours) at 12/22/2019 1047 Last data filed at 12/21/2019 1820 Gross per 24 hour  Intake 0 ml  Output 550 ml  Net -550 ml     Physical Exam: Vital Signs Blood pressure (!) 136/49, pulse 73, temperature 98.4 F (36.9 C), resp. rate 19, height 5' 7"  (1.702 m), weight 79.8 kg, SpO2 97 %.   Constitutional: No distress . Vital signs reviewed. Patient is lying in bed lethargic. Appears to be somewhat improved in attention today.  HEENT:  oral membranes dry and lips dry Neck: supple Cardiovascular: RRR Respiratory/Chest: very gurgly and coarse sounds- like needs to cough up mucus but cannot; a few rhonchi also heard, adequate air movement   GI/Abdomen: soft, NT, ND, (+)BS Ext: no clubbing, cyanosis, or edema Skin:No rashes seenLarge egg-sized lipoma right shoulder.  Neurologic: See constitutional. Motor strength is 3-4/5 in Left, R side no movement in RUE/RLE Sensory exam normal sensation to light touch  in bilateral upper and lower extremities as well as facial DTR's 2++ RUE RLE Right CN VII and poor oral-motor control, decreased gag/cough gurgly sounding- not handling her own secretions-  No evidence of nystagmus Musculoskeletal: no joint pain   Assessment/Plan: 1. Functional deficits  secondary to Left brainstem vs subcortical infarct which require 3+ hours per day of interdisciplinary therapy in a comprehensive inpatient rehab setting.  Physiatrist is providing close team supervision and 24 hour management of active medical problems listed below.  Physiatrist and rehab team continue to assess barriers to discharge/monitor patient progress toward functional and medical goals  Care Tool:  Bathing  Bathing activity did not occur: Safety/medical concerns Body parts bathed by patient: Face   Body parts bathed by helper: Right arm, Left arm, Chest, Abdomen, Front perineal area, Buttocks, Right upper leg, Left upper leg, Right lower leg, Left lower leg     Bathing assist Assist Level: 2 Helpers     Upper Body Dressing/Undressing Upper body dressing   What is the patient wearing?: Pull over shirt    Upper body assist Assist Level: Total Assistance - Patient < 25%    Lower Body Dressing/Undressing Lower body dressing      What is the patient wearing?: Incontinence brief, Pants     Lower body assist Assist for lower body dressing: 2 Helpers     Toileting Toileting Toileting Activity did not occur (Clothing management and hygiene only): N/A (no void or bm)  Toileting assist Assist for toileting: 2 Helpers     Transfers Chair/bed transfer  Transfers assist  Chair/bed transfer activity did not occur: Safety/medical concerns  Chair/bed transfer assist level: Dependent - mechanical lift     Locomotion Ambulation   Ambulation assist  Ambulation activity did not occur: Safety/medical concerns          Walk 10 feet activity   Assist  Walk 10 feet activity did not occur: Safety/medical concerns        Walk 50 feet activity   Assist Walk 50 feet with 2 turns activity did not occur: Safety/medical concerns         Walk 150 feet activity   Assist Walk 150 feet activity did not occur: Safety/medical concerns         Walk 10 feet  on uneven surface  activity   Assist Walk 10 feet on uneven surfaces activity did not occur: Safety/medical concerns         Wheelchair     Assist Will patient use wheelchair at discharge?: Yes Type of Wheelchair: Manual Wheelchair activity did not occur: Safety/medical concerns         Wheelchair 50 feet with 2 turns activity    Assist    Wheelchair 50 feet with 2 turns activity did not occur: Safety/medical concerns       Wheelchair 150 feet activity     Assist  Wheelchair 150 feet activity did not occur: Safety/medical concerns       Blood pressure (!) 136/49, pulse 73, temperature 98.4 F (36.9 C), resp. rate 19, height 5' 7"  (1.702 m), weight 79.8 kg, SpO2 97 %.    Medical Problem List and Plan: 1.  Rightt side hemiplegia, dysarthria, +/- aphasia secondary to left brain infarction.  MRI not completed due to pacemaker  -CT demonstrates hypoattenuation in left pons which is consistent with current presentation---increased right hp and increased difficulty with oro-pharyngeal control             -patient may shower             -ELOS/Goals: 25-30 days/min/mod a             Continue PT, OT, SLP   -team conference conducted on 3/3, change to 15/7 2.  Antithrombotics: -DVT/anticoagulation: Subcutaneous Lovenox             -antiplatelet therapy: Continue Aspirin 81 mg daily, Plavix 75 mg daily 3. Pain Management: Continue Tylenol as needed 4. Mood with memory deficits.  Aricept 10 mg nightly             -antipsychotic agents: N/A 5. Neuropsych: This patient is NOT capable of making decisions on her own behalf. 6. Skin/Wound Care: Routine skin checks 7. Fluids/Electrolytes/Nutrition: Routine in and outs.  CMP ordered for tomorrow a.m. 8.  Hypertension.  Norvasc 2.5 mg daily, Coreg 3.125 mg twice daily.               Monitor with increased mobility Vitals:   12/21/19 2100 12/22/19 0433  BP:  (!) 136/49  Pulse: 70 73  Resp: 18 19  Temp:  98.4 F (36.9  C)  SpO2: 95% 97%   -2/28 permissive HTN for this week given small vessel disease  3/1- BP still elevated- permissive HTN this next week.   3/2: well controlled.  9.  Diabetes mellitus with hyperglycemia.  Hemoglobin A1c 8.2.  Lantus insulin 10 units twice daily, NovoLog 2 units 3 times daily check blood sugars before meals and at bedtime              CBG (last 3)  Recent Labs    12/22/19 0112 12/22/19 0631 12/22/19 0949  GLUCAP 298* 355* 335*   CBGs elevated increase lantus dose 14U BID  2/28 improving control, not eating much though  -reduce lantus while npo tp 6u bid,hold scheduled novolog   3/1- will monitor because changing to cortrak/TFs and will get TF orders via Nutrition.    3/2: Changed to Cortrak orders today. Discussed with Linna Hoff, PA plans to restart TF.   3/3: TF restarted.   3/4: Appreciate nephrology and hospitalist follow-up. Uncontrolled hyperglycemia is likely 2/2 to D which is needed for hypernatremia.  10.  CAD with stenting as well as pacemaker.  Continue aspirin and Plavix. 11.  CKD stage IIIb.  Creatinine baseline 1.56.               Cr 1.26 on 2/25  3/1- Cr 1.52- at baseline 12.  Chronic diastolic congestive heart failure.  Monitor for any signs of fluid overload             Daily weights 13.  OSA.  CPAP. 14.  Hyperlipidemia.  Lipitor 15.  Chronic normocytic anemia.  Continue iron supplement             hgb 10 16.  Post stroke dysphagia: Dysphagia #2 thin liquids.  Follow-up speech therapy.  Advance as tolerated  2/27 diet downgraded to D1/nectar given pontine infarct/presentation   -aspiration precautions, close observation   -low threshold to make npo, re-check cxr  2/28 still struggling with mgt of secretions d/t oro-pharyngeal weakness.   -make npo   -start IVF for now   -MBS tomorrow  3/2: see diet/nutrition.  17.  UTI/Citrobacter/EColi: S to . Keflex finish 7d course   -low grade temp yesterday  -change keflex to rocephin1g daily for 2  days  3/1- completed today- wil recheck CBC tomorrow to make sure not increasing after finishing ABX. 3/2: continuing to increase. Will trend tomorrow.  18. Hypernatremia  3/1- needs more fluids- per IM, ordered D5water for now 100cc/hour- will reassess after Na has improved 19. Hypercalcemia  3/1- Ca++ is 13.4- concerning- has been an issue for awhile per IM notes- will get sodium controlled and see if Ca improves as well with fluids- if not, needs additional w/u- some work up already started-  Agree with PTH, PTHrP, TSH, 25vitD, 1,25VitD, SPEP- many of which are already being done.   3/2: will repeat tomorrow.   3/3 Ca improved this morning. Appreciate nephrology involvement: Sensipar 7m BID started.  20. Disposition: Appreciate DDorien ChihuahuaRN, SW discussed patient care conference with family yesterday and family expressed understanding.      LOS: 8 days A FACE TO FACE EVALUATION WAS PERFORMED  KClide DeutscherRaulkar 12/22/2019, 10:47 AM

## 2019-12-23 ENCOUNTER — Inpatient Hospital Stay (HOSPITAL_COMMUNITY): Payer: Medicare Other | Admitting: Occupational Therapy

## 2019-12-23 ENCOUNTER — Inpatient Hospital Stay (HOSPITAL_COMMUNITY): Payer: Medicare Other | Admitting: Physical Therapy

## 2019-12-23 ENCOUNTER — Inpatient Hospital Stay (HOSPITAL_COMMUNITY): Payer: Medicare Other | Admitting: Speech Pathology

## 2019-12-23 LAB — RENAL FUNCTION PANEL
Albumin: 2.4 g/dL — ABNORMAL LOW (ref 3.5–5.0)
Anion gap: 6 (ref 5–15)
BUN: 23 mg/dL (ref 8–23)
CO2: 28 mmol/L (ref 22–32)
Calcium: 11.2 mg/dL — ABNORMAL HIGH (ref 8.9–10.3)
Chloride: 109 mmol/L (ref 98–111)
Creatinine, Ser: 1.28 mg/dL — ABNORMAL HIGH (ref 0.44–1.00)
GFR calc Af Amer: 43 mL/min — ABNORMAL LOW (ref >60)
GFR calc non Af Amer: 37 mL/min — ABNORMAL LOW (ref >60)
Glucose, Bld: 299 mg/dL — ABNORMAL HIGH (ref 70–99)
Phosphorus: 2.1 mg/dL — ABNORMAL LOW (ref 2.5–4.6)
Potassium: 4.1 mmol/L (ref 3.5–5.1)
Sodium: 143 mmol/L (ref 135–145)

## 2019-12-23 LAB — CBC
HCT: 30.5 % — ABNORMAL LOW (ref 36.0–46.0)
Hemoglobin: 9.5 g/dL — ABNORMAL LOW (ref 12.0–15.0)
MCH: 29.1 pg (ref 26.0–34.0)
MCHC: 31.1 g/dL (ref 30.0–36.0)
MCV: 93.3 fL (ref 80.0–100.0)
Platelets: 227 10*3/uL (ref 150–400)
RBC: 3.27 MIL/uL — ABNORMAL LOW (ref 3.87–5.11)
RDW: 12.2 % (ref 11.5–15.5)
WBC: 11.1 10*3/uL — ABNORMAL HIGH (ref 4.0–10.5)
nRBC: 0 % (ref 0.0–0.2)

## 2019-12-23 LAB — GLUCOSE, CAPILLARY
Glucose-Capillary: 192 mg/dL — ABNORMAL HIGH (ref 70–99)
Glucose-Capillary: 252 mg/dL — ABNORMAL HIGH (ref 70–99)
Glucose-Capillary: 274 mg/dL — ABNORMAL HIGH (ref 70–99)
Glucose-Capillary: 278 mg/dL — ABNORMAL HIGH (ref 70–99)
Glucose-Capillary: 287 mg/dL — ABNORMAL HIGH (ref 70–99)

## 2019-12-23 LAB — MAGNESIUM: Magnesium: 2 mg/dL (ref 1.7–2.4)

## 2019-12-23 MED ORDER — INSULIN GLARGINE 100 UNIT/ML ~~LOC~~ SOLN
10.0000 [IU] | Freq: Two times a day (BID) | SUBCUTANEOUS | Status: DC
  Administered 2019-12-23 (×2): 10 [IU] via SUBCUTANEOUS
  Filled 2019-12-23 (×4): qty 0.1

## 2019-12-23 NOTE — Progress Notes (Signed)
Occupational Therapy Weekly Progress Note  Patient Details  Name: [name] MRN: [id number] Date of Birth: [birth year]  Beginning of progress report period: 12/15/2019 End of progress report period: 12/23/2019  Today's Date: 12/23/2019 OT Individual Time: 0950-1101 OT Individual Time Calculation (min): 71 min    Patient has met 0 of 3 short term goals.    Pt has made very minimal progress at time of report. She continues to requires Total to +2 assist for all self care tasks and transfers using the [place]. Pt is most limited functionally by profound lethargy, motor planning deficits, and dense Rt hemiparesis. Goals have been downgraded to Mod A overall to reflect her slow progress. Continue OT POC.   Patient continues to demonstrate the following deficits: muscle weakness and muscle paralysis, decreased cardiorespiratoy endurance, abnormal tone, unbalanced muscle activation, decreased coordination and decreased motor planning, decreased midline orientation and decreased attention to right, decreased initiation, decreased attention, decreased awareness, decreased problem solving, decreased safety awareness and delayed processing and decreased sitting balance, decreased standing balance, decreased postural control and hemiplegia and therefore will continue to benefit from skilled OT intervention to enhance overall performance with BADL.  Patient not progressing toward long term goals.  See goal revision..    OT Short Term Goals Week 1:  OT Short Term Goal 1 (Week 1): Pt will maintain dynamic balance during self care tasks at mod A or less for 5 minutes. OT Short Term Goal 1 - Progress (Week 1): Not met OT Short Term Goal 2 (Week 1): Pt will perform UB dressing with mod A. OT Short Term Goal 2 - Progress (Week 1): Not met OT Short Term Goal 3 (Week 1): Pt will perform functional transfer with assist of 1 person safely. OT Short Term Goal 3 - Progress (Week 1): Not met Week 2:  OT Short Term  Goal 1 (Week 2): Pt will complete 1 grooming task at the sink with mod cues for sequencing OT Short Term Goal 2 (Week 2): Pt will complete sit<stand in [place] with 1 assist during toileting or dressing tasks OT Short Term Goal 3 (Week 2): Pt will complete 1 self care task EOB with Mod A for sitting balance  Skilled Therapeutic Interventions/Progress Updates:    Pt greeted in the TIS with RN present, finishing up with oral care using suction toothbrush. Pt denied pain, nodding when asked if she wanted to get dressed. She was able to point to the shirt she wanted to wear when given option of 2 presented Rt of midline. While sitting upright at the sink, she completed handwashing with [facility] assist. Pt able to maintain anterior weight shift with steady assist after OT gave her an initial boost. HOH for pt to assist with threading R UE into shirt sleeve. She did initiate bringing her Lt hand towards the other shirt sleeve when it was presented afterwards. HOH for assisting to pull shirt down over trunk. Max facilitation for upright neutral trunk due to sinking/LOB towards the Rt side with onset of fatigue. During LB dressing tasks, pt grimacing when her LEs were positioned in modified figure 4 and therefore focused on pt pulling fabric up from knee to thigh. OT asked pt if she wanted to try using the toilet and she stated "yes." +2 for sit<stand in [place] and for transfer to the elevated BSC. +2 for hip scooting for improved positioning. Then worked on sitting balance while voiding with +2 CGA assist, though when pt leaned too far forward she  need Max facilitation for upright neutral posture. She had a continent B+B void, very large BM. Worked on supported standing in [place] during perihygiene and LB dressing tasks. Note that pt urinated on her pants, so continued working on sit<stands and supported standing from TIS when changing pants. Pt at this time with very heavy anterior lean over the [place] bar and was Total Ax2  for sit<stands due to fatigue. When she was reclined in the TIS, note that pt was asleep before OT fastened her safety belt. Left pt with soft call bell, in care of RN to hook her back up to the tube feed.   Therapy Documentation Precautions:  Precautions Precautions: Fall Restrictions Weight Bearing Restrictions: No Vital Signs: Therapy Vitals Temp: 97.7 F (36.5 C) Temp Source: Oral Pulse Rate: 74 Resp: 15 BP: (!) 120/49 Patient Position (if appropriate): Lying Oxygen Therapy SpO2: 98 % O2 Device: Room Air ADL:       Therapy/Group: Individual Therapy  Hulon Ferron A Johnnell Liou 12/23/2019, 7:10 AM

## 2019-12-23 NOTE — Progress Notes (Signed)
PROGRESS NOTE  [name] HER:[id number] DOB: Nov 08, 1927   PCP: [name], MD  DOA: 12/14/2019 LOS: 9  Brief Narrative / Interim history: 86 y.o.femaleHTN, heart block s/p PM, DM type 2, OSA on CPAP, CAD s/p PCI, CVA recently hospitalized (2/16-2/24) with rt sided hemiparesis and dysphagia, and discharged to CIR. Hospital course complicated by UTI with Citrobacter and E.Coli treated with Meropenem and transitioned toKelflex.  TRH consulted on 3/1 for "medical management". On 2/27 she was downgraded from dysphagia 2 diet to a dysphagia 1 diet, and on 2/28 made n.p.o. due to difficulty in handling secretions. Current plan is for placement of NGT tube. Patient was switched to Rocephin to complete antibiotic course for UTI.    Labs significant for WBC 10.7, hemoglobin 10.8 sodium 154, BUN 32, creatinine 1.52, andcalcium 13.4  Nephrology consulted as well.  Subjective: No major events overnight or this morning.  No complaints but minimally verbal.  CBG in 200s.  Creatinine improving as well.  Objective: Vitals:   12/22/19 0500 12/22/19 1403 12/22/19 1933 12/23/19 0434  BP:  (!) 148/62 (!) 141/65 (!) 120/49  Pulse:  70 74 74  Resp:  18 15 15   Temp:  (!) 97.1 F (36.2 C) 97.8 F (36.6 C) 97.7 F (36.5 C)  TempSrc:    Oral  SpO2:  100% 97% 98%  Weight: 79.8 kg     Height:        Intake/Output Summary (Last 24 hours) at 12/23/2019 1130 Last data filed at 12/23/2019 0820 Gross per 24 hour  Intake 0 ml  Output 950 ml  Net -950 ml   Filed Weights   12/20/19 0500 12/21/19 0404 12/22/19 0500  Weight: 77 kg 78.3 kg 79.8 kg    Examination:  GENERAL: No apparent distress.  Sitting on wheelchair. HEENT: MMM.  Poor dentition.  On TF via core track NECK: Supple.  No apparent JVD.  RESP:  No IWOB.  Fair aeration bilaterally CVS:  RRR. Heart sounds normal.  ABD/GI/GU: Bowel sounds present. Soft. Non tender.  MSK/EXT:  Moves extremities. No apparent deformity.  Trace edema  bilaterally. SKIN: no apparent skin lesion or wound NEURO: Awake awake and alert.  Aphasia and right hemiparesis. PSYCH: Calm. Normal affect.  Procedures:  None  Assessment & Plan: Hypercalcemia: Likely a combination of dehydration, immobilization and primary hyperparathyroidism.  -Ca 13.4> 11.2 -Nephrology managing-started Sensipar 30 mg twice daily and IV fluids  Hyponatremia: Resolved with D5 and free water -Discontinued D5 in the setting of hyperglycemia -Continue free water  Uncontrolled diabetes with hyperglycemia: Hyperglycemia improved. Recent Labs    12/23/19 0028 12/23/19 0431 12/23/19 0802  GLUCAP 274* 252* 278*  -Change SSI-moderate to q4h while on TF -NovoLog 3 units every 4 hours -Increase Lantus from 6 to 10 units twice daily -Continue statin  Subacute CVA with right-sided hemiparesis -Per primary-on statin, Plavix and aspirin -PT/OT/SLP  Dysphagia: Downgraded from dysphagia to 2 dysphagia 1, then to NPO.  Now on TF with cortrack -Continue tube feed per primary  Hypokalemia: Resolved.  AKI on CKD-3b: prerenal azotemia partly due to dehydration in the setting of poor p.o. intake.  Renal function improved. -Free water and tube feed as above -Nephrology started IV fluid.  Urinary tract infection: Urine culture with E. coli and Citrobacter. -Completed antibiotics with meropenem> Keflex> ceftriaxone  Anemia of chronic disease: H&H relatively stable -Continue monitoring -Continue iron  Essential hypertension: Normotensive -Continue current regimen  History of complete AVB s/p PPM  Nutrition Problem: Inadequate oral intake Etiology: inability to eat, dysphagia  Signs/Symptoms: NPO status  Interventions: Tube feeding   DVT prophylaxis: Subcu Lovenox Code Status: Full code Family Communication: Patient and/or RN. Available if any question. Disposition: Per primary  Consultants: We are   Microbiology summarized: None  Sch Meds:   Scheduled Meds: . amLODipine  2.5 mg Per Tube Daily  . aspirin  81 mg Per J Tube Daily  . atorvastatin  80 mg Per Tube q morning - 10a  . brimonidine  1 drop Both Eyes BID  . carvedilol  3.125 mg Per Tube BID  . chlorhexidine  15 mL Mouth Rinse BID  . cinacalcet  30 mg Oral BID WC  . clopidogrel  75 mg Per Tube Daily  . donepezil  10 mg Per Tube QHS  . dorzolamide  1 drop Both Eyes TID  . enoxaparin (LOVENOX) injection  30 mg Subcutaneous Daily  . feeding supplement (PRO-STAT SUGAR FREE 64)  30 mL Per Tube Daily  . ferrous sulfate  300 mg Oral Q breakfast  . free water  140 mL Per Tube Q6H  . insulin aspart  0-15 Units Subcutaneous Q4H  . insulin aspart  3 Units Subcutaneous Q4H  . insulin glargine  10 Units Subcutaneous BID  . latanoprost  1 drop Both Eyes QHS  . mouth rinse  15 mL Mouth Rinse q12n4p  . pantoprazole sodium  40 mg Per Tube QAC breakfast  . polyethylene glycol  17 g Per Tube BID  . senna-docusate  1 tablet Per Tube BID  . sodium chloride flush  10-40 mL Intracatheter Q12H   Continuous Infusions: . sodium chloride 75 mL/hr at 12/22/19 1312  . feeding supplement (JEVITY 1.2 CAL) 1,000 mL (12/22/19 1317)   PRN Meds:.acetaminophen **OR** acetaminophen (TYLENOL) oral liquid 160 mg/5 mL **OR** acetaminophen, ipratropium-albuterol, ondansetron, sodium chloride flush, sorbitol  Antimicrobials: Anti-infectives (From admission, onward)   Start     Dose/Rate Route Frequency Ordered Stop   12/18/19 0945  cefTRIAXone (ROCEPHIN) 1 g in sodium chloride 0.9 % 100 mL IVPB     1 g 200 mL/hr over 30 Minutes Intravenous Every 24 hours 12/18/19 0932 12/19/19 1050   12/14/19 2000  meropenem (MERREM) 1 g in sodium chloride 0.9 % 100 mL IVPB  Status:  Discontinued     1 g 200 mL/hr over 30 Minutes Intravenous Every 12 hours 12/14/19 1619 12/14/19 1639   12/14/19 2000  cephALEXin (KEFLEX) capsule 500 mg  Status:  Discontinued     500 mg Oral Every 12 hours 12/14/19 1639 12/18/19  0932       I have personally reviewed the following labs and images: CBC: Recent Labs  Lab 12/19/19 0434 12/20/19 0353 12/21/19 0350 12/22/19 0648 12/23/19 0552  WBC 10.7* 11.1* 10.6* 11.3* 11.1*  NEUTROABS  --  7.4 6.7  --   --   HGB 10.8* 10.5* 10.4* 9.5* 9.5*  HCT 36.0 35.3* 34.2* 30.6* 30.5*  MCV 97.0 97.0 95.0 92.4 93.3  PLT 277 241 227 222 227   BMP &GFR Recent Labs  Lab 12/19/19 0434 12/19/19 1308 12/20/19 0353 12/21/19 0350 12/22/19 0648 12/23/19 0552  NA 154*  --  148* 138 143 143  K 3.6  --  3.3* 3.2* 4.4 4.1  CL 115*  --  113* 104 108 109  CO2 31  --  29 26 28 28   GLUCOSE 148*  --  249* 388* 397* 299*  BUN 32*  --  26*  22 27* 23  CREATININE 1.52*  --  1.33* 1.33* 1.49* 1.28*  CALCIUM 13.4* 13.8* 12.6* 11.5* 11.3* 11.2*  MG  --   --   --   --  2.1 2.0  PHOS  --   --   --   --  1.9* 2.1*   Estimated Creatinine Clearance: 32.4 mL/min (A) (by C-G formula based on SCr of 1.28 mg/dL (H)). Liver & Pancreas: Recent Labs  Lab 12/18/19 0951 12/22/19 0648 12/23/19 0552  AST 27  --   --   ALT 27  --   --   ALKPHOS 69  --   --   BILITOT 0.6  --   --   PROT 6.9  --   --   ALBUMIN 3.2* 2.4* 2.4*   No results for input(s): LIPASE, AMYLASE in the last 168 hours. No results for input(s): AMMONIA in the last 168 hours. Diabetic: No results for input(s): HGBA1C in the last 72 hours. Recent Labs  Lab 12/22/19 1632 12/22/19 2044 12/23/19 0028 12/23/19 0431 12/23/19 0802  GLUCAP 250* 244* 274* 252* 278*   Cardiac Enzymes: No results for input(s): CKTOTAL, CKMB, CKMBINDEX, TROPONINI in the last 168 hours. No results for input(s): PROBNP in the last 8760 hours. Coagulation Profile: No results for input(s): INR, PROTIME in the last 168 hours. Thyroid Function Tests: No results for input(s): TSH, T4TOTAL, FREET4, T3FREE, THYROIDAB in the last 72 hours. Lipid Profile: No results for input(s): CHOL, HDL, LDLCALC, TRIG, CHOLHDL, LDLDIRECT in the last 72  hours. Anemia Panel: No results for input(s): VITAMINB12, FOLATE, FERRITIN, TIBC, IRON, RETICCTPCT in the last 72 hours. Urine analysis:    Component Value Date/Time   COLORURINE YELLOW 12/12/2019 1030   APPEARANCEUR HAZY (A) 12/12/2019 1030   LABSPEC 1.015 12/12/2019 1030   PHURINE 6.0 12/12/2019 1030   GLUCOSEU NEGATIVE 12/12/2019 1030   HGBUR NEGATIVE 12/12/2019 1030   BILIRUBINUR NEGATIVE 12/12/2019 1030   KETONESUR NEGATIVE 12/12/2019 1030   PROTEINUR NEGATIVE 12/12/2019 1030   UROBILINOGEN 0.2 08/07/2015 1826   NITRITE NEGATIVE 12/12/2019 1030   LEUKOCYTESUR SMALL (A) 12/12/2019 1030   Sepsis Labs: Invalid input(s): PROCALCITONIN, [place]  Microbiology: No results found for this or any previous visit (from the past 240 hour(s)).  Radiology Studies: No results found.    Wallace Gappa T. [place]  If 7PM-7AM, please contact night-coverage [email or web address] Password [email or web address] 12/23/2019, 11:30 AM

## 2019-12-23 NOTE — Care Management CC44 (Signed)
Social Work Patient ID: Renee Rival, female   DOB: 12-Jul-1928, 86 y.o.   MRN: [id number]   Spoke with grand-daughter Reita Chard regarding discharge plan. The family wants to take Mrs. Selover home and avoid SNF if at all possible. They have reviewed insurance coverage and confirmed 8 hours/day of PCS/aide assistance is covered by insurance and would like to proceed with the discharge plan of home on 01/06/20 with [facility] services. The family will piece together 24/7 care using the [facility] aide 8 hours/day.

## 2019-12-23 NOTE — Progress Notes (Signed)
Physical Therapy Weekly Progress Note  Patient Details  Name: [name] MRN: [id number] Date of Birth: [birth year]  Beginning of progress report period: December 15, 2019 End of progress report period: December 23, 2019  Today's Date: 12/23/2019 PT Individual Time: 0800-0855 PT Individual Time Calculation (min): 55 min   Patient has met 2 of 4 short term goals.  Pt has made very little progress towards LTG due to worsening deficits from CVA evolution. Pt remains lethargic through most therapy sessions requiring total-total +2 for bed mobility at transfers. Severe R sided hemiplegia with no aparent return to UE or LE at this time. Currently requires total A for sit<>supine bed mobility. Total +2 to performed sit<>stand in stedy, and occassional maximove transfers to [facility].   Patient continues to demonstrate the following deficits muscle weakness, muscle joint tightness and muscle paralysis, decreased cardiorespiratoy endurance, abnormal tone, unbalanced muscle activation, motor apraxia and decreased motor planning, field cut, decreased attention, decreased awareness, decreased problem solving, decreased safety awareness, decreased memory and delayed processing and decreased sitting balance, decreased standing balance, decreased postural control, hemiplegia and decreased balance strategies and therefore will continue to benefit from skilled PT intervention to increase functional independence with mobility.  Patient progressing toward long term goals..  Continue plan of care.  PT Short Term Goals Week 1:  PT Short Term Goal 1 (Week 1): pt will remain out of bed 1 hour between therapies. PT Short Term Goal 1 - Progress (Week 1): Met PT Short Term Goal 2 (Week 1): Pt will perform bed<>WC transfer with max assist of 1 person PT Short Term Goal 2 - Progress (Week 1): Not met PT Short Term Goal 3 (Week 1): Pt will attend to R side 25 % the time with moderate cues in functional tasks. PT Short Term Goal  3 - Progress (Week 1): Met PT Short Term Goal 4 (Week 1): Pt will initiate WC mobility PT Short Term Goal 4 - Progress (Week 1): Not met Week 2:  PT Short Term Goal 1 (Week 2): Pt will maintain sitting balance EOB with min assist consistently up to 3 minutes. PT Short Term Goal 2 (Week 2): Pt will transfer to [facility] with max assist of 1 person. PT Short Term Goal 3 (Week 2): Pt will perform bed mobility with max assis tof 1 consistently  Skilled Therapeutic Interventions/Progress Updates:   Pt received supine in bed and agreeable to PT. Rolling R and L with mod assist to the R and max assist to the L for UE and LE positioning. Pt assessed to have had no incontinence prior to PT treatment. Supine>sit transfer with total assist max cues for positioning and sequencing. Sitting balance EOB x 15 minutes with min assist overall for static sitting balance. MD present for assessment while pt EOB. Lateral lean R and L through elbows    x 3 with mod assist for R lean and min assist for L  To position chuck pad under pt.   SB transfer to [facility] on the L with max assist and chuck pad under pt. Pt able to initiate anterior weight shift and pull with LUE to transfer to chair. PT required to prevent excessive anterior weight shift and facilitate improved RLE placement.   Pt transported to rehab gym. Sit<>stand in parallel bars with RLE blocked and LUE supported on rail x 3 with max-total A. On 3rd attempt pt performed standing tolerance x 20sec and max assist + 2 for improved erect posture. Patient returned  to room and left sitting inTIS WC with call bell in reach and all needs met.         Therapy Documentation Precautions:  Precautions Precautions: Fall Restrictions Weight Bearing Restrictions: No Pain: Faces: hurts a little: pt repositioned  And pain decreased Therapy/Group: Individual Therapy  Lorie Phenix 12/23/2019, 9:12 AM

## 2019-12-23 NOTE — Progress Notes (Signed)
[facility] PHYSICAL MEDICINE & REHABILITATION PROGRESS NOTE   Subjective/Complaints: [name] is sitting up at edge of bed with PT.  Drooling Alert and makes good eye contact, nods yes and shakes head no to questions but still with great impairments in maintaining sitting balance and in comprehension and expression.   ROS: pt nonverbal this AM- couldn't get ROS Objective:   No results found. Recent Labs    12/22/19 0648 12/23/19 0552  WBC 11.3* 11.1*  HGB 9.5* 9.5*  HCT 30.6* 30.5*  PLT 222 227   Recent Labs    12/22/19 0648 12/23/19 0552  NA 143 143  K 4.4 4.1  CL 108 109  CO2 28 28  GLUCOSE 397* 299*  BUN 27* 23  CREATININE 1.49* 1.28*  CALCIUM 11.3* 11.2*    Intake/Output Summary (Last 24 hours) at 12/23/2019 1028 Last data filed at 12/23/2019 0820 Gross per 24 hour  Intake 0 ml  Output 950 ml  Net -950 ml     Physical Exam: Vital Signs Blood pressure (!) 120/49, pulse 74, temperature 97.7 F (36.5 C), temperature source Oral, resp. rate 15, height 5' 7"  (1.702 m), weight 79.8 kg, SpO2 98 %.   Constitutional: No distress . Vital signs reviewed. Sitting up in bed with PT, severely impaired sitting balance.  HEENT:  oral membranes dry and lips dry Neck: supple Cardiovascular: RRR Respiratory/Chest: very gurgly and coarse sounds- like needs to cough up mucus but cannot; a few rhonchi also heard, adequate air movement   GI/Abdomen: soft, NT, ND, (+)BS Ext: no clubbing, cyanosis, or edema Skin:No rashes seenLarge egg-sized lipoma right shoulder.  Neurologic: See constitutional. Motor strength is 3-4/5 in Left, R side no movement in RUE/RLE Sensory exam normal sensation to light touch  in bilateral upper and lower extremities as well as facial DTR's 2++ RUE RLE Right CN VII and poor oral-motor control, decreased gag/cough gurgly sounding- not handling her own secretions-  No evidence of nystagmus Musculoskeletal: no joint pain   Assessment/Plan: 1.  Functional deficits secondary to Left brainstem vs subcortical infarct which require 3+ hours per day of interdisciplinary therapy in a comprehensive inpatient rehab setting.  Physiatrist is providing close team supervision and 24 hour management of active medical problems listed below.  Physiatrist and rehab team continue to assess barriers to discharge/monitor patient progress toward functional and medical goals  Care Tool:  Bathing  Bathing activity did not occur: Safety/medical concerns Body parts bathed by patient: Face   Body parts bathed by helper: Right arm, Left arm, Chest, Abdomen, Front perineal area, Buttocks, Right upper leg, Left upper leg, Right lower leg, Left lower leg     Bathing assist Assist Level: 2 Helpers     Upper Body Dressing/Undressing Upper body dressing   What is the patient wearing?: Pull over shirt    Upper body assist Assist Level: Total Assistance - Patient < 25%    Lower Body Dressing/Undressing Lower body dressing      What is the patient wearing?: Incontinence brief, Pants     Lower body assist Assist for lower body dressing: 2 Helpers     Toileting Toileting Toileting Activity did not occur (Clothing management and hygiene only): N/A (no void or bm)  Toileting assist Assist for toileting: 2 Helpers     Transfers Chair/bed transfer  Transfers assist  Chair/bed transfer activity did not occur: Safety/medical concerns  Chair/bed transfer assist level: Dependent - mechanical lift     Locomotion Ambulation   Ambulation  assist   Ambulation activity did not occur: Safety/medical concerns          Walk 10 feet activity   Assist  Walk 10 feet activity did not occur: Safety/medical concerns        Walk 50 feet activity   Assist Walk 50 feet with 2 turns activity did not occur: Safety/medical concerns         Walk 150 feet activity   Assist Walk 150 feet activity did not occur: Safety/medical concerns          Walk 10 feet on uneven surface  activity   Assist Walk 10 feet on uneven surfaces activity did not occur: Safety/medical concerns         Wheelchair     Assist Will patient use wheelchair at discharge?: Yes Type of Wheelchair: Manual Wheelchair activity did not occur: Safety/medical concerns         Wheelchair 50 feet with 2 turns activity    Assist    Wheelchair 50 feet with 2 turns activity did not occur: Safety/medical concerns       Wheelchair 150 feet activity     Assist  Wheelchair 150 feet activity did not occur: Safety/medical concerns       Blood pressure (!) 120/49, pulse 74, temperature 97.7 F (36.5 C), temperature source Oral, resp. rate 15, height 5' 7"  (1.702 m), weight 79.8 kg, SpO2 98 %.    Medical Problem List and Plan: 1.  Rightt side hemiplegia, dysarthria, +/- aphasia secondary to left brain infarction.  MRI not completed due to pacemaker  -CT demonstrates hypoattenuation in left pons which is consistent with current presentation---increased right hp and increased difficulty with oro-pharyngeal control             -patient may shower             -ELOS/Goals: 25-30 days/min/mod a             Continue PT, OT, SLP   -team conference conducted on 3/3, change to 15/7 2.  Antithrombotics: -DVT/anticoagulation: Subcutaneous Lovenox             -antiplatelet therapy: Continue Aspirin 81 mg daily, Plavix 75 mg daily 3. Pain Management: ContinueTylenol as needed 4. Mood with memory deficits.  Aricept 10 mg nightly             -antipsychotic agents: N/A 5. Neuropsych: This patient is NOT capable of making decisions on her own behalf. 6. Skin/Wound Care: Routine skin checks 7. Fluids/Electrolytes/Nutrition: Routine in and outs.  CMP ordered for tomorrow a.m. 8.  Hypertension.  Norvasc 2.5 mg daily, Coreg 3.125 mg twice daily.               Monitor with increased mobility Vitals:   12/22/19 1933 12/23/19 0434  BP: (!) 141/65 (!)  120/49  Pulse: 74 74  Resp: 15 15  Temp: 97.8 F (36.6 C) 97.7 F (36.5 C)  SpO2: 97% 98%   -2/28 permissive HTN for this week given small vessel disease  3/1- BP still elevated- permissive HTN this next week.   3/2, 3/5: well controlled.  9.  Diabetes mellitus with hyperglycemia.  Hemoglobin A1c 8.2.  Lantus insulin 10 units twice daily, NovoLog 2 units 3 times daily check blood sugars before meals and at bedtime              CBG (last 3)  Recent Labs    12/23/19 0028 12/23/19 0431 12/23/19 0802  GLUCAP 274* 252*  278*   CBGs elevated increase lantus dose 14U BID   2/28 improving control, not eating much though  -reduce lantus while npo tp 6u bid,hold scheduled novolog   3/1- will monitor because changing to cortrak/TFs and will get TF orders via Nutrition.    3/2: Changed to Cortrak orders today. Discussed with Linna Hoff, PA plans to restart TF.   3/3: TF restarted.   3/4: Appreciate nephrology and hospitalist follow-up. Uncontrolled hyperglycemia is likely 2/2 to D which is needed for hypernatremia.   3/5: D5 discontinued by hospitalist given hyperglycemia. 278 glucose this morning.  10.  CAD with stenting as well as pacemaker.  Continue aspirin and Plavix. 11.  CKD stage IIIb.  Creatinine baseline 1.56.               Cr 1.26 on 2/25  3/1- Cr 1.52- at baseline  3/5: nephrology started on IV fluid. Cr improved.  12.  Chronic diastolic congestive heart failure.  Monitor for any signs of fluid overload             Daily weights 13.  OSA.  CPAP. 14.  Hyperlipidemia.  Lipitor 15.  Chronic normocytic anemia.  Continue iron supplement             hgb 10 16.  Post stroke dysphagia: Dysphagia #2 thin liquids.  Follow-up speech therapy.  Advance as tolerated  2/27 diet downgraded to D1/nectar given pontine infarct/presentation   -aspiration precautions, close observation   -low threshold to make npo, re-check cxr  2/28 still struggling with mgt of secretions d/t oro-pharyngeal  weakness.   -make npo   -start IVF for now   -MBS tomorrow  3/2: see diet/nutrition.  17.  UTI/Citrobacter/EColi: S to . Keflex finish 7d course   -low grade temp yesterday  -change keflex to rocephin1g daily for 2 days  3/1- completed today- wil recheck CBC tomorrow to make sure not increasing after finishing ABX. 3/2: continuing to increase. Will trend tomorrow.  18. Hypernatremia  3/1- needs more fluids- per IM, ordered D5water for now 100cc/hour- will reassess after Na has improved 19. Hypercalcemia  3/1- Ca++ is 13.4- concerning- has been an issue for awhile per IM notes- will get sodium controlled and see if Ca improves as well with fluids- if not, needs additional w/u- some work up already started-  Agree with PTH, PTHrP, TSH, 25vitD, 1,25VitD, SPEP- many of which are already being done.   3/2: will repeat tomorrow.   3/3 Ca improved this morning. Appreciate nephrology involvement: Sensipar 26m BID started.  20. Disposition: Appreciate DDorien ChihuahuaRN, SW discussed patient care conference with family yesterday and family expressed understanding.      LOS: 9 days A FACE TO FACE EVALUATION WAS PERFORMED  KMartha ClanP Damar Petit 12/23/2019, 10:28 AM

## 2019-12-23 NOTE — Progress Notes (Signed)
[facility] Progress Note   86 y.o.femaleHTN, heart block s/p PM, DM type 2, OSA on CPAP, CASHD s/p PCI, CVA recently hospitalized with rt sided hemiparesis with hospital course complicated by UTI with Citrobacter and E.Coli treated with Meropenem ->Kelflex. Was on a dysphagia diet but has been having a large amount of secretions and likely to need a NGT for feeds. Ca has been on high side of normal but after a few days it is currently in the 13's.  Assessment/ Plan:   1. Hypercalcemia - continues to increase and has history of periodic elevation which is more consistent with immobilization + prerenal azotemia although she certainly merits a full workup. I do not see any Calcium or VitD administration.   -She hasprimary hyperparathyroidism; should have a low PTH with the level of hypercalcemia she has. -StartedSensipar 35m BID (3/3) -> can always titrate in a few weeks if needed to 631mBID.  Signing off at this time; please reconsult as needed.  2. AKI - looks dry -> 1/2 NS x1 L only 3. Hypernatremia with a free water deficit of 3.9L -nice response. 4. CHF with EF 50-55% - appears compensated 5. Anemia 6. DM  Subjective:   She denies f/c/n/v/ shortness of breath   Objective:   BP (!) 120/49 (BP Location: Right Arm)   Pulse 74   Temp 97.7 F (36.5 C) (Oral)   Resp 15   Ht 5' 7"  (1.702 m)   Wt 79.8 kg   SpO2 98%   BMI 27.55 kg/m   Intake/Output Summary (Last 24 hours) at 12/23/2019 1414 Last data filed at 12/23/2019 0820 Gross per 24 hour  Intake 0 ml  Output 950 ml  Net -950 ml   Weight change:   Physical Exam: General appearance:alert and cooperative Resp:diminished breath soundsbibasilar and bilaterally Cardio:S1, S2 normal GIZW:CHENnon-tender; bowel sounds normal; no masses, no organomegaly Extremities:[place] Pulses:2+ and symmetric  Imaging: No results found.  Labs: BMET Recent Labs  Lab 12/18/19 0951 12/19/19 0434  12/19/19 1308 12/20/19 0353 12/21/19 0350 12/22/19 0648 12/23/19 0552  NA 151* 154*  --  148* 138 143 143  K 4.0 3.6  --  3.3* 3.2* 4.4 4.1  CL 114* 115*  --  113* 104 108 109  CO2 31 31  --  29 26 28 28   GLUCOSE 207* 148*  --  249* 388* 397* 299*  BUN 31* 32*  --  26* 22 27* 23  CREATININE 1.52* 1.52*  --  1.33* 1.33* 1.49* 1.28*  CALCIUM 13.7* 13.4* 13.8* 12.6* 11.5* 11.3* 11.2*  PHOS  --   --   --   --   --  1.9* 2.1*   CBC Recent Labs  Lab 12/20/19 0353 12/21/19 0350 12/22/19 0648 12/23/19 0552  WBC 11.1* 10.6* 11.3* 11.1*  NEUTROABS 7.4 6.7  --   --   HGB 10.5* 10.4* 9.5* 9.5*  HCT 35.3* 34.2* 30.6* 30.5*  MCV 97.0 95.0 92.4 93.3  PLT 241 227 222 227    Medications:    . amLODipine  2.5 mg Per Tube Daily  . aspirin  81 mg Per J Tube Daily  . atorvastatin  80 mg Per Tube q morning - 10a  . brimonidine  1 drop Both Eyes BID  . carvedilol  3.125 mg Per Tube BID  . chlorhexidine  15 mL Mouth Rinse BID  . cinacalcet  30 mg Oral BID WC  . clopidogrel  75 mg Per Tube Daily  .  donepezil  10 mg Per Tube QHS  . dorzolamide  1 drop Both Eyes TID  . enoxaparin (LOVENOX) injection  30 mg Subcutaneous Daily  . feeding supplement (PRO-STAT SUGAR FREE 64)  30 mL Per Tube Daily  . ferrous sulfate  300 mg Oral Q breakfast  . free water  140 mL Per Tube Q6H  . insulin aspart  0-15 Units Subcutaneous Q4H  . insulin aspart  3 Units Subcutaneous Q4H  . insulin glargine  10 Units Subcutaneous BID  . latanoprost  1 drop Both Eyes QHS  . mouth rinse  15 mL Mouth Rinse q12n4p  . pantoprazole sodium  40 mg Per Tube QAC breakfast  . polyethylene glycol  17 g Per Tube BID  . senna-docusate  1 tablet Per Tube BID  . sodium chloride flush  10-40 mL Intracatheter Q12H      Otelia Santee, MD 12/23/2019, 2:14 PM

## 2019-12-23 NOTE — Plan of Care (Signed)
  Problem: RH Balance Goal: LTG: Patient will maintain dynamic sitting balance (OT) Description: LTG:  Patient will maintain dynamic sitting balance with assistance during activities of daily living (OT) Flowsheets (Taken 12/23/2019 1244) LTG: Pt will maintain dynamic sitting balance during ADLs with: Moderate Assistance - Patient 50 - 74% Note: Downgraded due to slow progress    Problem: Sit to Stand Goal: LTG:  Patient will perform sit to stand in prep for activites of daily living with assistance level (OT) Description: LTG:  Patient will perform sit to stand in prep for activites of daily living with assistance level (OT) Flowsheets (Taken 12/23/2019 1244) LTG: PT will perform sit to stand in prep for activites of daily living with assistance level: Moderate Assistance - Patient 50 - 74% Note: Downgraded due to slow progress    Problem: RH Eating Goal: LTG Patient will perform eating w/assist, cues/equip (OT) Description: LTG: Patient will perform eating with assist, with/without cues using equipment (OT) Flowsheets (Taken 12/23/2019 1244) LTG: Pt will perform eating with assistance level of: Moderate Assistance - Patient 50 - 74% Note: Downgraded due to slow progress    Problem: RH Grooming Goal: LTG Patient will perform grooming w/assist,cues/equip (OT) Description: LTG: Patient will perform grooming with assist, with/without cues using equipment (OT) Flowsheets (Taken 12/23/2019 1244) LTG: Pt will perform grooming with assistance level of: Minimal Assistance - Patient > 75% Note: Downgraded due to slow progress    Problem: RH Bathing Goal: LTG Patient will bathe all body parts with assist levels (OT) Description: LTG: Patient will bathe all body parts with assist levels (OT) Flowsheets (Taken 12/23/2019 1244) LTG: Pt will perform bathing with assistance level/cueing: Moderate Assistance - Patient 50 - 74% Note: Downgraded due to slow progress    Problem: RH Dressing Goal: LTG Patient  will perform upper body dressing (OT) Description: LTG Patient will perform upper body dressing with assist, with/without cues (OT). Flowsheets (Taken 12/23/2019 1244) LTG: Pt will perform upper body dressing with assistance level of: Moderate Assistance - Patient 50 - 74% Note: Downgraded due to slow progress  Goal: LTG Patient will perform lower body dressing w/assist (OT) Description: LTG: Patient will perform lower body dressing with assist, with/without cues in positioning using equipment (OT) Flowsheets (Taken 12/23/2019 1244) LTG: Pt will perform lower body dressing with assistance level of: Moderate Assistance - Patient 50 - 74% Note: Downgraded due to slow progress    Problem: RH Toileting Goal: LTG Patient will perform toileting task (3/3 steps) with assistance level (OT) Description: LTG: Patient will perform toileting task (3/3 steps) with assistance level (OT)  Flowsheets (Taken 12/23/2019 1244) LTG: Pt will perform toileting task (3/3 steps) with assistance level: Moderate Assistance - Patient 50 - 74% Note: Downgraded due to slow progress    Problem: RH Toilet Transfers Goal: LTG Patient will perform toilet transfers w/assist (OT) Description: LTG: Patient will perform toilet transfers with assist, with/without cues using equipment (OT) Flowsheets (Taken 12/23/2019 1244) LTG: Pt will perform toilet transfers with assistance level of: Moderate Assistance - Patient 50 - 74% Note: Downgraded due to slow progress    Problem: RH Tub/Shower Transfers Goal: LTG Patient will perform tub/shower transfers w/assist (OT) Description: LTG: Patient will perform tub/shower transfers with assist, with/without cues using equipment (OT) Flowsheets (Taken 12/23/2019 1244) LTG: Pt will perform tub/shower stall transfers with assistance level of: Moderate Assistance - Patient 50 - 74% Note: Downgraded due to slow progress

## 2019-12-23 NOTE — Progress Notes (Signed)
Patient unable to wear CPAP due too nasal feeding tube. Patient in no distress at this time.

## 2019-12-23 NOTE — Progress Notes (Signed)
Speech Language Pathology Daily Session Note  Patient Details  Name: [name] MRN: [id number] Date of Birth: [birth year]  Today's Date: 12/23/2019 SLP Individual Time: 1300-1358 SLP Individual Time Calculation (min): 58 min  Short Term Goals: Week 2: SLP Short Term Goal 1 (Week 2): Pt will consume trials of ice chips, demonstrating inititaion of swallow sequence in 1/5 opportunities with Max A multimodal cues. SLP Short Term Goal 2 (Week 2): Pt will sustain attention to functional familiar tasks for 2 minute intervals with Max A multimodal cues. SLP Short Term Goal 3 (Week 2): Pt will demonstrate ability to problem solve during basic functional tasks with Max A verbal/visual cues. SLP Short Term Goal 4 (Week 2): Pt will demonstrate verbal and physical initiation in 50% opportunities with Max A multimodal cues. SLP Short Term Goal 5 (Week 2): Pt orient to place and time with Min A verbal/visual cues for use of aids. SLP Short Term Goal 6 (Week 2): Pt will respond to Mod A multimodal cues for use of increased vocal intensity to increase speech intelligibility at the word or short phrase level.  Skilled Therapeutic Interventions: Pt was seen for skilled ST targeting dysphagia and cognitive goals. Pt was lethargic but able to maintain eyes open for 3-5 minute intervals and generally more aroused in comparison to yesterday. However, pt found with increase in pooling secretions, spilling out of oral cavity upon arrival. Total A required for secretion management X 4 during session. Pt unable to initiate swallow sequence in 3/3 ice chip trials (after oral care), requiring suctioning each time. Pt did move head posteriorly as if trying to physically trigger swallow, however Max A multimodal cues unsuccessful in aiding patient to trigger swallow sequence today.  Pt required Max A for orientation to hospital. She sustained attention to a familiar laundry folding ask with overall Mod A verbal cues for  redirection and Max A verbal an visual cues were provided for problem solving throughout. She did initiate in 80% of functional familiar tasks today. Pt was minimally verbal throughout session and mostly unreceptive to cues for use of strategies to increase speech intelligibility - when she did respond verbally she was ~50% intelligible at the word level. Pt left sitting in wheelchair with chair alarm set and needs within reach. Continue per current plan of care.        Pain Pain Assessment Pain Scale: 0-10 Pain Score: 0-No pain  Therapy/Group: Individual Therapy  Arbutus Leas 12/23/2019, 1:50 PM

## 2019-12-24 ENCOUNTER — Inpatient Hospital Stay (HOSPITAL_COMMUNITY): Payer: Medicare Other | Admitting: Occupational Therapy

## 2019-12-24 LAB — RENAL FUNCTION PANEL
Albumin: 2.6 g/dL — ABNORMAL LOW (ref 3.5–5.0)
Anion gap: 8 (ref 5–15)
BUN: 21 mg/dL (ref 8–23)
CO2: 30 mmol/L (ref 22–32)
Calcium: 11.2 mg/dL — ABNORMAL HIGH (ref 8.9–10.3)
Chloride: 104 mmol/L (ref 98–111)
Creatinine, Ser: 1.18 mg/dL — ABNORMAL HIGH (ref 0.44–1.00)
GFR calc Af Amer: 47 mL/min — ABNORMAL LOW (ref >60)
GFR calc non Af Amer: 41 mL/min — ABNORMAL LOW (ref >60)
Glucose, Bld: 312 mg/dL — ABNORMAL HIGH (ref 70–99)
Phosphorus: 2.4 mg/dL — ABNORMAL LOW (ref 2.5–4.6)
Potassium: 4.2 mmol/L (ref 3.5–5.1)
Sodium: 142 mmol/L (ref 135–145)

## 2019-12-24 LAB — GLUCOSE, CAPILLARY
Glucose-Capillary: 163 mg/dL — ABNORMAL HIGH (ref 70–99)
Glucose-Capillary: 182 mg/dL — ABNORMAL HIGH (ref 70–99)
Glucose-Capillary: 233 mg/dL — ABNORMAL HIGH (ref 70–99)
Glucose-Capillary: 253 mg/dL — ABNORMAL HIGH (ref 70–99)
Glucose-Capillary: 255 mg/dL — ABNORMAL HIGH (ref 70–99)
Glucose-Capillary: 303 mg/dL — ABNORMAL HIGH (ref 70–99)

## 2019-12-24 LAB — MAGNESIUM: Magnesium: 2 mg/dL (ref 1.7–2.4)

## 2019-12-24 MED ORDER — INSULIN ASPART 100 UNIT/ML ~~LOC~~ SOLN
4.0000 [IU] | SUBCUTANEOUS | Status: DC
  Administered 2019-12-24 – 2019-12-25 (×6): 4 [IU] via SUBCUTANEOUS

## 2019-12-24 MED ORDER — INSULIN GLARGINE 100 UNIT/ML ~~LOC~~ SOLN
12.0000 [IU] | Freq: Two times a day (BID) | SUBCUTANEOUS | Status: DC
  Administered 2019-12-24 (×2): 12 [IU] via SUBCUTANEOUS
  Filled 2019-12-24 (×4): qty 0.12

## 2019-12-24 NOTE — Progress Notes (Signed)
[place] PHYSICAL MEDICINE & REHABILITATION PROGRESS NOTE   Subjective/Complaints: Lying in bed comfortably. Makes good eye contact and nods in response to questions.  Glucose elevated on BMP. Cr decreasing.   ROS: pt nonverbal this AM- couldn't get ROS Objective:   No results found. Recent Labs    12/22/19 0648 12/23/19 0552  WBC 11.3* 11.1*  HGB 9.5* 9.5*  HCT 30.6* 30.5*  PLT 222 227   Recent Labs    12/23/19 0552 12/24/19 0534  NA 143 142  K 4.1 4.2  CL 109 104  CO2 28 30  GLUCOSE 299* 312*  BUN 23 21  CREATININE 1.28* 1.18*  CALCIUM 11.2* 11.2*    Intake/Output Summary (Last 24 hours) at 12/24/2019 1605 Last data filed at 12/24/2019 1330 Gross per 24 hour  Intake 0 ml  Output 1910 ml  Net -1910 ml     Physical Exam: Vital Signs Blood pressure 103/86, pulse 74, temperature 97.9 F (36.6 C), temperature source Oral, resp. rate 14, height 5' 7"  (1.702 m), weight 79.2 kg, SpO2 98 %.   Constitutional: No distress . Vital signs reviewed. Lying in bed, alert.  HEENT:  oral membranes dry and lips dry Neck: supple Cardiovascular: RRR Respiratory/Chest: very gurgly and coarse sounds- like needs to cough up mucus but cannot; a few rhonchi also heard, adequate air movement   GI/Abdomen: soft, NT, ND, (+)BS Ext: no clubbing, cyanosis, or edema Skin:No rashes seenLarge egg-sized lipoma right shoulder.  Neurologic: See constitutional. Motor strength is 3-4/5 in Left, R side no movement in RUE/RLE Sensory exam normal sensation to light touch  in bilateral upper and lower extremities as well as facial DTR's 2++ RUE RLE Right CN VII and poor oral-motor control, decreased gag/cough gurgly sounding- not handling her own secretions-  No evidence of nystagmus Musculoskeletal: no joint pain   Assessment/Plan: 1. Functional deficits secondary to Left brainstem vs subcortical infarct which require 3+ hours per day of interdisciplinary therapy in a comprehensive  inpatient rehab setting.  Physiatrist is providing close team supervision and 24 hour management of active medical problems listed below.  Physiatrist and rehab team continue to assess barriers to discharge/monitor patient progress toward functional and medical goals  Care Tool:  Bathing  Bathing activity did not occur: Safety/medical concerns Body parts bathed by patient: Face   Body parts bathed by helper: Right arm, Left arm, Chest, Abdomen, Front perineal area, Buttocks, Right upper leg, Left upper leg, Right lower leg, Left lower leg     Bathing assist Assist Level: 2 Helpers     Upper Body Dressing/Undressing Upper body dressing   What is the patient wearing?: Pull over shirt, Bra    Upper body assist Assist Level: Total Assistance - Patient < 25%    Lower Body Dressing/Undressing Lower body dressing      What is the patient wearing?: Incontinence brief, Pants     Lower body assist Assist for lower body dressing: 2 Helpers(using Stedy)     Toileting Toileting Toileting Activity did not occur Landscape architect and hygiene only): N/A (no void or bm)  Toileting assist Assist for toileting: 2 Helpers     Transfers Chair/bed transfer  Transfers assist  Chair/bed transfer activity did not occur: Safety/medical concerns  Chair/bed transfer assist level: Dependent - mechanical lift     Locomotion Ambulation   Ambulation assist   Ambulation activity did not occur: Safety/medical concerns          Walk 10 feet activity  Assist  Walk 10 feet activity did not occur: Safety/medical concerns        Walk 50 feet activity   Assist Walk 50 feet with 2 turns activity did not occur: Safety/medical concerns         Walk 150 feet activity   Assist Walk 150 feet activity did not occur: Safety/medical concerns         Walk 10 feet on uneven surface  activity   Assist Walk 10 feet on uneven surfaces activity did not occur: Safety/medical  concerns         Wheelchair     Assist Will patient use wheelchair at discharge?: Yes Type of Wheelchair: Manual Wheelchair activity did not occur: Safety/medical concerns         Wheelchair 50 feet with 2 turns activity    Assist    Wheelchair 50 feet with 2 turns activity did not occur: Safety/medical concerns       Wheelchair 150 feet activity     Assist  Wheelchair 150 feet activity did not occur: Safety/medical concerns       Blood pressure 103/86, pulse 74, temperature 97.9 F (36.6 C), temperature source Oral, resp. rate 14, height 5' 7"  (1.702 m), weight 79.2 kg, SpO2 98 %.    Medical Problem List and Plan: 1.  Rightt side hemiplegia, dysarthria, +/- aphasia secondary to left brain infarction.  MRI not completed due to pacemaker  -CT demonstrates hypoattenuation in left pons which is consistent with current presentation---increased right hp and increased difficulty with oro-pharyngeal control             -patient may shower             -ELOS/Goals: 25-30 days/min/mod a             Continue PT, OT, SLP   -team conference conducted on 3/3, change to 15/7 2.  Antithrombotics: -DVT/anticoagulation: Subcutaneous Lovenox             -antiplatelet therapy: Continue Aspirin 81 mg daily, Plavix 75 mg daily 3. Pain Management: ContinueTylenol as needed, pain is well controlled.  4. Mood with memory deficits.  Aricept 10 mg nightly             -antipsychotic agents: N/A 5. Neuropsych: This patient is NOT capable of making decisions on her own behalf. 6. Skin/Wound Care: Routine skin checks 7. Fluids/Electrolytes/Nutrition: Routine in and outs.  CMP ordered for tomorrow a.m. 8.  Hypertension.  Norvasc 2.5 mg daily, Coreg 3.125 mg twice daily.               Monitor with increased mobility Vitals:   12/24/19 0309 12/24/19 1447  BP: (!) 156/59 103/86  Pulse: 70 74  Resp: 18 14  Temp: 97.9 F (36.6 C)   SpO2: 97% 98%   -2/28 permissive HTN for this week  given small vessel disease  3/1- BP still elevated- permissive HTN this next week.   3/2, 3/5: well controlled.   3/6: labile, continue to monitor.  9.  Diabetes mellitus with hyperglycemia.  Hemoglobin A1c 8.2.  Lantus insulin 10 units twice daily, NovoLog 2 units 3 times daily check blood sugars before meals and at bedtime              CBG (last 3)  Recent Labs    12/24/19 0440 12/24/19 0836 12/24/19 1158  GLUCAP 255* 233* 253*   CBGs elevated increase lantus dose 14U BID   2/28 improving control, not eating much  though  -reduce lantus while npo tp 6u bid,hold scheduled novolog   3/1- will monitor because changing to cortrak/TFs and will get TF orders via Nutrition.    3/2: Changed to Cortrak orders today. Discussed with Linna Hoff, PA plans to restart TF.   3/3: TF restarted.   3/4: Appreciate nephrology and hospitalist follow-up. Uncontrolled hyperglycemia is likely 2/2 to D which is needed for hypernatremia.   3/5: D5 discontinued by hospitalist given hyperglycemia. 278 glucose this morning.   3/6: Appreciate hospitalist recs: Novolog and Lantus increased.  10.  CAD with stenting as well as pacemaker.  Continue aspirin and Plavix. 11.  CKD stage IIIb.  Creatinine baseline 1.56.               Cr 1.26 on 2/25  3/1- Cr 1.52- at baseline  3/5: nephrology started on IV fluid. Cr improved.  12.  Chronic diastolic congestive heart failure.  Monitor for any signs of fluid overload             Daily weights 13.  OSA.  CPAP. 14.  Hyperlipidemia.  Lipitor 15.  Chronic normocytic anemia.  Continue iron supplement             hgb 10 16.  Post stroke dysphagia: Dysphagia #2 thin liquids.  Follow-up speech therapy.  Advance as tolerated  2/27 diet downgraded to D1/nectar given pontine infarct/presentation   -aspiration precautions, close observation   -low threshold to make npo, re-check cxr  2/28 still struggling with mgt of secretions d/t oro-pharyngeal weakness.   -make npo   -start IVF for  now   -MBS tomorrow  3/2: see diet/nutrition.  17.  UTI/Citrobacter/EColi: S to . Keflex finish 7d course   -low grade temp yesterday  -change keflex to rocephin1g daily for 2 days  3/1- completed today- wil recheck CBC tomorrow to make sure not increasing after finishing ABX. 3/2: continuing to increase. Will trend tomorrow.  18. Hypernatremia  3/1- needs more fluids- per IM, ordered D5water for now 100cc/hour- will reassess after Na has improved 19. Hypercalcemia  3/1- Ca++ is 13.4- concerning- has been an issue for awhile per IM notes- will get sodium controlled and see if Ca improves as well with fluids- if not, needs additional w/u- some work up already started-  Agree with PTH, PTHrP, TSH, 25vitD, 1,25VitD, SPEP- many of which are already being done.   3/2: will repeat tomorrow.   3/3 Ca improved this morning. Appreciate nephrology involvement: Sensipar 80m BID started.   3/6: Ca down to 11.2. However, Sensipar can not be crushed.  20. Disposition: Appreciate DDorien ChihuahuaRN, SW discussed patient care conference with family yesterday and family expressed understanding.      LOS: 10 days A FACE TO FACE EVALUATION WAS PERFORMED  KMartha ClanP Ayaat Jansma 12/24/2019, 4:05 PM

## 2019-12-24 NOTE — Progress Notes (Signed)
PROGRESS NOTE  [name] LPF:[id number] DOB: 1928/07/09   PCP: [name], MD  DOA: 12/14/2019 LOS: 10  Brief Narrative / Interim history: 86 y.o.femaleHTN, heart block s/p PM, DM type 2, OSA on CPAP, CAD s/p PCI, CVA recently hospitalized (2/16-2/24) with rt sided hemiparesis and dysphagia, and discharged to CIR. Hospital course complicated by UTI with Citrobacter and E.Coli treated with Meropenem and transitioned toKelflex.  TRH consulted on 3/1 for "medical management". On 2/27 she was downgraded from dysphagia 2 diet to a dysphagia 1 diet, and on 2/28 made n.p.o. due to difficulty in handling secretions. Current plan is for placement of NGT tube. Patient was switched to Rocephin to complete antibiotic course for UTI.    Labs significant for WBC 10.7, hemoglobin 10.8 sodium 154, BUN 32, creatinine 1.52, andcalcium 13.4  Nephrology consulted as well.  Subjective: No major events overnight or this morning.  No complaints but minimally verbal.  CBG in 200s.  Creatinine improving.  Calcium still elevated but stable.  Objective: Vitals:   12/23/19 0434 12/23/19 1449 12/23/19 2015 12/24/19 0309  BP: (!) 120/49 (!) 152/63 (!) 152/64 (!) 156/59  Pulse: 74 78 74 70  Resp: 15  18 18   Temp: 97.7 F (36.5 C) 97.7 F (36.5 C) 98.1 F (36.7 C) 97.9 F (36.6 C)  TempSrc: Oral Oral  Oral  SpO2: 98% 92% 99% 97%  Weight:    79.2 kg  Height:        Intake/Output Summary (Last 24 hours) at 12/24/2019 1254 Last data filed at 12/24/2019 0306 Gross per 24 hour  Intake 0 ml  Output 1400 ml  Net -1400 ml   Filed Weights   12/21/19 0404 12/22/19 0500 12/24/19 0309  Weight: 78.3 kg 79.8 kg 79.2 kg    Examination:  GENERAL: No apparent distress.  Lying in bed comfortably. HEENT: MMM.  Vision and hearing grossly intact.  Poor dentition. NECK: Supple.  No apparent JVD.  RESP: On RA.  No IWOB.  Fair aeration bilaterally. CVS:  RRR. Heart sounds normal.  ABD/GI/GU: Bowel sounds  present. Soft. Non tender.  MSK/EXT:  Moves extremities. No apparent deformity.  Trace pedal edema bilaterally.Marland Kitchen  SKIN: no apparent skin lesion or wound NEURO: Awake and alert.  Significant aphasia with right hemiparesis. PSYCH: Calm. Normal affect.   Procedures:  None  Assessment & Plan: Hypercalcemia: Likely a combination of dehydration, immobilization and primary hyperparathyroidism.  -Ca 13.4> 11.2 -Nephrology managing-started Sensipar 30 mg twice daily and IV fluids  Hyponatremia: Resolved with D5 and free water -Discontinued D5 in the setting of hyperglycemia -Continue free water  Uncontrolled diabetes with hyperglycemia: Hyperglycemia improved. Recent Labs    12/24/19 0440 12/24/19 0836 12/24/19 1158  GLUCAP 255* 233* 253*  -Continue SSI-moderate to q4h while on TF -Increase NovoLog from 3-4 units every 4 hours -Increase Lantus from 10-12 units twice daily -Continue statin  Subacute CVA with right-sided hemiparesis -Per primary-on statin, Plavix and aspirin -PT/OT/SLP  Dysphagia: Downgraded from dysphagia to 2 dysphagia 1, then to NPO.  Now on TF with cortrack -Continue tube feed per primary  Hypokalemia: Resolved.  AKI on CKD-3b: prerenal azotemia partly due to dehydration in the setting of poor p.o. intake.  Renal function improved. -Free water and tube feed as above -Nephrology started IV fluid.  Urinary tract infection: Urine culture with E. coli and Citrobacter. -Completed antibiotics with meropenem> Keflex> ceftriaxone  Anemia of chronic disease: H&H relatively stable -Continue monitoring -Continue iron  Essential hypertension: Normotensive -Continue  current regimen  History of complete AVB s/p PPM     Nutrition Problem: Inadequate oral intake Etiology: inability to eat, dysphagia  Signs/Symptoms: NPO status  Interventions: Tube feeding   DVT prophylaxis: Subcu Lovenox Code Status: Full code Family Communication: Patient and/or RN.  Available if any question. Disposition: Per primary  Consultants: We are   Microbiology summarized: None  Sch Meds:  Scheduled Meds: . amLODipine  2.5 mg Per Tube Daily  . aspirin  81 mg Per J Tube Daily  . atorvastatin  80 mg Per Tube q morning - 10a  . brimonidine  1 drop Both Eyes BID  . carvedilol  3.125 mg Per Tube BID  . chlorhexidine  15 mL Mouth Rinse BID  . cinacalcet  30 mg Oral BID WC  . clopidogrel  75 mg Per Tube Daily  . donepezil  10 mg Per Tube QHS  . dorzolamide  1 drop Both Eyes TID  . enoxaparin (LOVENOX) injection  30 mg Subcutaneous Daily  . feeding supplement (PRO-STAT SUGAR FREE 64)  30 mL Per Tube Daily  . ferrous sulfate  300 mg Oral Q breakfast  . free water  140 mL Per Tube Q6H  . insulin aspart  0-15 Units Subcutaneous Q4H  . insulin aspart  4 Units Subcutaneous Q4H  . insulin glargine  12 Units Subcutaneous BID  . latanoprost  1 drop Both Eyes QHS  . mouth rinse  15 mL Mouth Rinse q12n4p  . pantoprazole sodium  40 mg Per Tube QAC breakfast  . polyethylene glycol  17 g Per Tube BID  . senna-docusate  1 tablet Per Tube BID  . sodium chloride flush  10-40 mL Intracatheter Q12H   Continuous Infusions: . sodium chloride 75 mL/hr at 12/22/19 1312  . feeding supplement (JEVITY 1.2 CAL) 1,000 mL (12/22/19 1317)   PRN Meds:.acetaminophen **OR** acetaminophen (TYLENOL) oral liquid 160 mg/5 mL **OR** acetaminophen, ipratropium-albuterol, ondansetron, sodium chloride flush, sorbitol  Antimicrobials: Anti-infectives (From admission, onward)   Start     Dose/Rate Route Frequency Ordered Stop   12/18/19 0945  cefTRIAXone (ROCEPHIN) 1 g in sodium chloride 0.9 % 100 mL IVPB     1 g 200 mL/hr over 30 Minutes Intravenous Every 24 hours 12/18/19 0932 12/19/19 1050   12/14/19 2000  meropenem (MERREM) 1 g in sodium chloride 0.9 % 100 mL IVPB  Status:  Discontinued     1 g 200 mL/hr over 30 Minutes Intravenous Every 12 hours 12/14/19 1619 12/14/19 1639    12/14/19 2000  cephALEXin (KEFLEX) capsule 500 mg  Status:  Discontinued     500 mg Oral Every 12 hours 12/14/19 1639 12/18/19 0932       I have personally reviewed the following labs and images: CBC: Recent Labs  Lab 12/19/19 0434 12/20/19 0353 12/21/19 0350 12/22/19 0648 12/23/19 0552  WBC 10.7* 11.1* 10.6* 11.3* 11.1*  NEUTROABS  --  7.4 6.7  --   --   HGB 10.8* 10.5* 10.4* 9.5* 9.5*  HCT 36.0 35.3* 34.2* 30.6* 30.5*  MCV 97.0 97.0 95.0 92.4 93.3  PLT 277 241 227 222 227   BMP &GFR Recent Labs  Lab 12/20/19 0353 12/21/19 0350 12/22/19 0648 12/23/19 0552 12/24/19 0534  NA 148* 138 143 143 142  K 3.3* 3.2* 4.4 4.1 4.2  CL 113* 104 108 109 104  CO2 29 26 28 28 30   GLUCOSE 249* 388* 397* 299* 312*  BUN 26* 22 27* 23 21  CREATININE 1.33* 1.33*  1.49* 1.28* 1.18*  CALCIUM 12.6* 11.5* 11.3* 11.2* 11.2*  MG  --   --  2.1 2.0 2.0  PHOS  --   --  1.9* 2.1* 2.4*   Estimated Creatinine Clearance: 35 mL/min (A) (by C-G formula based on SCr of 1.18 mg/dL (H)). Liver & Pancreas: Recent Labs  Lab 12/18/19 0951 12/22/19 0648 12/23/19 0552 12/24/19 0534  AST 27  --   --   --   ALT 27  --   --   --   ALKPHOS 69  --   --   --   BILITOT 0.6  --   --   --   PROT 6.9  --   --   --   ALBUMIN 3.2* 2.4* 2.4* 2.6*   No results for input(s): LIPASE, AMYLASE in the last 168 hours. No results for input(s): AMMONIA in the last 168 hours. Diabetic: No results for input(s): HGBA1C in the last 72 hours. Recent Labs  Lab 12/23/19 2014 12/24/19 0001 12/24/19 0440 12/24/19 0836 12/24/19 1158  GLUCAP 192* 163* 255* 233* 253*   Cardiac Enzymes: No results for input(s): CKTOTAL, CKMB, CKMBINDEX, TROPONINI in the last 168 hours. No results for input(s): PROBNP in the last 8760 hours. Coagulation Profile: No results for input(s): INR, PROTIME in the last 168 hours. Thyroid Function Tests: No results for input(s): TSH, T4TOTAL, FREET4, T3FREE, THYROIDAB in the last 72 hours. Lipid  Profile: No results for input(s): CHOL, HDL, LDLCALC, TRIG, CHOLHDL, LDLDIRECT in the last 72 hours. Anemia Panel: No results for input(s): VITAMINB12, FOLATE, FERRITIN, TIBC, IRON, RETICCTPCT in the last 72 hours. Urine analysis:    Component Value Date/Time   COLORURINE YELLOW 12/12/2019 1030   APPEARANCEUR HAZY (A) 12/12/2019 1030   LABSPEC 1.015 12/12/2019 1030   PHURINE 6.0 12/12/2019 1030   GLUCOSEU NEGATIVE 12/12/2019 1030   HGBUR NEGATIVE 12/12/2019 1030   BILIRUBINUR NEGATIVE 12/12/2019 1030   KETONESUR NEGATIVE 12/12/2019 1030   PROTEINUR NEGATIVE 12/12/2019 1030   UROBILINOGEN 0.2 08/07/2015 1826   NITRITE NEGATIVE 12/12/2019 1030   LEUKOCYTESUR SMALL (A) 12/12/2019 1030   Sepsis Labs: Invalid input(s): PROCALCITONIN, [place]  Microbiology: No results found for this or any previous visit (from the past 240 hour(s)).  Radiology Studies: No results found.    See [place]  If 7PM-7AM, please contact night-coverage [email or web address] Password [facility] 12/24/2019, 12:54 PM

## 2019-12-24 NOTE — Plan of Care (Signed)
  Problem: RH BOWEL ELIMINATION Goal: RH STG MANAGE BOWEL WITH ASSISTANCE Description: STG Manage Bowel with Moderate Assistance. Outcome: Not Progressing; incontinent   Problem: RH BLADDER ELIMINATION Goal: RH STG MANAGE BLADDER WITH ASSISTANCE Description: STG Manage Bladder With Moderate Assistance Outcome: Not Progressing; I and o cath

## 2019-12-24 NOTE — Progress Notes (Signed)
Occupational Therapy Session Note  Patient Details  Name: [name] MRN: [id number] Date of Birth: Nov 19, 1927  Today's Date: 12/24/2019 OT Individual Time: 3159-4585 OT Individual Time Calculation (min): 27 min   Short Term Goals: Week 2:  OT Short Term Goal 1 (Week 2): Pt will complete 1 grooming task at the sink with mod cues for sequencing OT Short Term Goal 2 (Week 2): Pt will complete sit<stand in [place] with 1 assist during toileting or dressing tasks OT Short Term Goal 3 (Week 2): Pt will complete 1 self care task EOB with Mod A for sitting balance  Skilled Therapeutic Interventions/Progress Updates:    Pt greeted in bed, denying pain and agreeable to session. We completed passive ROM of Rt UE, focus placed on pt visually attending to limb while counting aloud to 10 with therapist. Pt needing max vcs to keep eyes open during this task but did well with counting. Retrograde massage also completed for edema mgt. Note that when pt was asked to touch her affected arm, she touched OT's wrist and squeezed it. She needed HOH to identify her own arm and complete self ROM of digits with Max A. At end of session pt remained in bed with all needs within reach, bed alarm set, hemiplegic side protected.   Therapy Documentation Precautions:  Precautions Precautions: Fall Restrictions Weight Bearing Restrictions: No ADL:        Therapy/Group: Individual Therapy  Kairo Laubacher A Britiny Defrain 12/24/2019, 4:09 PM

## 2019-12-24 NOTE — Progress Notes (Signed)
Patient has a NG tube inplace, unable to apply CPAP mask. Patient resting comfortably, RT will continue to monitor.

## 2019-12-25 ENCOUNTER — Inpatient Hospital Stay (HOSPITAL_COMMUNITY): Payer: Medicare Other | Admitting: Occupational Therapy

## 2019-12-25 ENCOUNTER — Inpatient Hospital Stay (HOSPITAL_COMMUNITY): Payer: Medicare Other | Admitting: Speech Pathology

## 2019-12-25 DIAGNOSIS — R7989 Other specified abnormal findings of blood chemistry: Secondary | ICD-10-CM

## 2019-12-25 DIAGNOSIS — Z8673 Personal history of transient ischemic attack (TIA), and cerebral infarction without residual deficits: Secondary | ICD-10-CM

## 2019-12-25 DIAGNOSIS — R1310 Dysphagia, unspecified: Secondary | ICD-10-CM

## 2019-12-25 DIAGNOSIS — E1165 Type 2 diabetes mellitus with hyperglycemia: Secondary | ICD-10-CM

## 2019-12-25 DIAGNOSIS — N179 Acute kidney failure, unspecified: Secondary | ICD-10-CM

## 2019-12-25 LAB — RENAL FUNCTION PANEL
Albumin: 2.5 g/dL — ABNORMAL LOW (ref 3.5–5.0)
Anion gap: 8 (ref 5–15)
BUN: 20 mg/dL (ref 8–23)
CO2: 29 mmol/L (ref 22–32)
Calcium: 10.8 mg/dL — ABNORMAL HIGH (ref 8.9–10.3)
Chloride: 104 mmol/L (ref 98–111)
Creatinine, Ser: 1.15 mg/dL — ABNORMAL HIGH (ref 0.44–1.00)
GFR calc Af Amer: 49 mL/min — ABNORMAL LOW (ref >60)
GFR calc non Af Amer: 42 mL/min — ABNORMAL LOW (ref >60)
Glucose, Bld: 287 mg/dL — ABNORMAL HIGH (ref 70–99)
Phosphorus: 2.1 mg/dL — ABNORMAL LOW (ref 2.5–4.6)
Potassium: 3.9 mmol/L (ref 3.5–5.1)
Sodium: 141 mmol/L (ref 135–145)

## 2019-12-25 LAB — GLUCOSE, CAPILLARY
Glucose-Capillary: 194 mg/dL — ABNORMAL HIGH (ref 70–99)
Glucose-Capillary: 236 mg/dL — ABNORMAL HIGH (ref 70–99)
Glucose-Capillary: 248 mg/dL — ABNORMAL HIGH (ref 70–99)
Glucose-Capillary: 268 mg/dL — ABNORMAL HIGH (ref 70–99)
Glucose-Capillary: 272 mg/dL — ABNORMAL HIGH (ref 70–99)
Glucose-Capillary: 293 mg/dL — ABNORMAL HIGH (ref 70–99)

## 2019-12-25 LAB — MAGNESIUM: Magnesium: 1.8 mg/dL (ref 1.7–2.4)

## 2019-12-25 MED ORDER — INSULIN GLARGINE 100 UNIT/ML ~~LOC~~ SOLN
15.0000 [IU] | Freq: Two times a day (BID) | SUBCUTANEOUS | Status: DC
  Administered 2019-12-25 – 2019-12-26 (×3): 15 [IU] via SUBCUTANEOUS
  Filled 2019-12-25 (×4): qty 0.15

## 2019-12-25 MED ORDER — INSULIN ASPART 100 UNIT/ML ~~LOC~~ SOLN
6.0000 [IU] | SUBCUTANEOUS | Status: DC
  Administered 2019-12-25 – 2020-01-04 (×51): 6 [IU] via SUBCUTANEOUS

## 2019-12-25 NOTE — Progress Notes (Signed)
[place] PHYSICAL MEDICINE & REHABILITATION PROGRESS NOTE   Subjective/Complaints: Lying in bed comfortably. Sleeping soundly.  Glucose, Cr, Ca all decreased today  ROS: pt nonverbal this AM- couldn't get ROS Objective:   No results found. Recent Labs    12/23/19 0552  WBC 11.1*  HGB 9.5*  HCT 30.5*  PLT 227   Recent Labs    12/24/19 0534 12/25/19 0521  NA 142 141  K 4.2 3.9  CL 104 104  CO2 30 29  GLUCOSE 312* 287*  BUN 21 20  CREATININE 1.18* 1.15*  CALCIUM 11.2* 10.8*    Intake/Output Summary (Last 24 hours) at 12/25/2019 1306 Last data filed at 12/25/2019 0830 Gross per 24 hour  Intake 10 ml  Output 1710 ml  Net -1700 ml     Physical Exam: Vital Signs Blood pressure (!) 156/61, pulse 81, temperature 97.8 F (36.6 C), temperature source Oral, resp. rate 20, height 5' 7"  (1.702 m), weight 79.2 kg, SpO2 100 %.   Constitutional: No distress . Vital signs reviewed. Lying in bed, sleeping soundly HEENT:  oral membranes dry and lips dry Neck: supple Cardiovascular: RRR Respiratory/Chest: very gurgly and coarse sounds- like needs to cough up mucus but cannot; a few rhonchi also heard, adequate air movement   GI/Abdomen: soft, NT, ND, (+)BS Ext: no clubbing, cyanosis, or edema Skin:No rashes seenLarge egg-sized lipoma right shoulder.  Neurologic: See constitutional. Motor strength is 3-4/5 in Left, R side no movement in RUE/RLE Sensory exam normal sensation to light touch  in bilateral upper and lower extremities as well as facial DTR's 2++ RUE RLE Right CN VII and poor oral-motor control, decreased gag/cough gurgly sounding- not handling her own secretions-  No evidence of nystagmus Musculoskeletal: no joint pain   Assessment/Plan: 1. Functional deficits secondary to Left brainstem vs subcortical infarct which require 3+ hours per day of interdisciplinary therapy in a comprehensive inpatient rehab setting.  Physiatrist is providing close team  supervision and 24 hour management of active medical problems listed below.  Physiatrist and rehab team continue to assess barriers to discharge/monitor patient progress toward functional and medical goals  Care Tool:  Bathing  Bathing activity did not occur: Safety/medical concerns Body parts bathed by patient: Face   Body parts bathed by helper: Right arm, Left arm, Chest, Abdomen, Front perineal area, Buttocks, Right upper leg, Left upper leg, Right lower leg, Left lower leg     Bathing assist Assist Level: 2 Helpers     Upper Body Dressing/Undressing Upper body dressing   What is the patient wearing?: Pull over shirt, Bra    Upper body assist Assist Level: Total Assistance - Patient < 25%    Lower Body Dressing/Undressing Lower body dressing      What is the patient wearing?: Incontinence brief, Pants     Lower body assist Assist for lower body dressing: 2 Helpers(using Stedy)     Toileting Toileting Toileting Activity did not occur Landscape architect and hygiene only): N/A (no void or bm)  Toileting assist Assist for toileting: 2 Helpers     Transfers Chair/bed transfer  Transfers assist  Chair/bed transfer activity did not occur: Safety/medical concerns  Chair/bed transfer assist level: Dependent - mechanical lift     Locomotion Ambulation   Ambulation assist   Ambulation activity did not occur: Safety/medical concerns          Walk 10 feet activity   Assist  Walk 10 feet activity did not occur: Safety/medical concerns  Walk 50 feet activity   Assist Walk 50 feet with 2 turns activity did not occur: Safety/medical concerns         Walk 150 feet activity   Assist Walk 150 feet activity did not occur: Safety/medical concerns         Walk 10 feet on uneven surface  activity   Assist Walk 10 feet on uneven surfaces activity did not occur: Safety/medical concerns         Wheelchair     Assist Will patient  use wheelchair at discharge?: Yes Type of Wheelchair: Manual Wheelchair activity did not occur: Safety/medical concerns         Wheelchair 50 feet with 2 turns activity    Assist    Wheelchair 50 feet with 2 turns activity did not occur: Safety/medical concerns       Wheelchair 150 feet activity     Assist  Wheelchair 150 feet activity did not occur: Safety/medical concerns       Blood pressure (!) 156/61, pulse 81, temperature 97.8 F (36.6 C), temperature source Oral, resp. rate 20, height 5' 7"  (1.702 m), weight 79.2 kg, SpO2 100 %.    Medical Problem List and Plan: 1.  Rightt side hemiplegia, dysarthria, +/- aphasia secondary to left brain infarction.  MRI not completed due to pacemaker  -CT demonstrates hypoattenuation in left pons which is consistent with current presentation---increased right hp and increased difficulty with oro-pharyngeal control             -patient may shower             -ELOS/Goals: 25-30 days/min/mod a             Continue PT, OT, SLP   -team conference conducted on 3/3, change to 15/7 2.  Antithrombotics: -DVT/anticoagulation: Subcutaneous Lovenox             -antiplatelet therapy: Continue Aspirin 81 mg daily, Plavix 75 mg daily 3. Pain Management: ContinueTylenol as needed, pain is well controlled.  4. Mood with memory deficits.  Aricept 10 mg nightly             -antipsychotic agents: N/A 5. Neuropsych: This patient is NOT capable of making decisions on her own behalf. 6. Skin/Wound Care: Routine skin checks 7. Fluids/Electrolytes/Nutrition: Routine in and outs.  CMP ordered for tomorrow a.m. 8.  Hypertension.  Norvasc 2.5 mg daily, Coreg 3.125 mg twice daily.               Monitor with increased mobility Vitals:   12/24/19 2011 12/25/19 0401  BP: (!) 159/66 (!) 156/61  Pulse: 70 81  Resp: 17 20  Temp: 98.7 F (37.1 C) 97.8 F (36.6 C)  SpO2: 100% 100%   -2/28 permissive HTN for this week given small vessel disease  3/1-  BP still elevated- permissive HTN this next week.   3/2, 3/5: well controlled.   3/6, 3/7: labile, continue to monitor. 9.  Diabetes mellitus with hyperglycemia.  Hemoglobin A1c 8.2.  Lantus insulin 10 units twice daily, NovoLog 2 units 3 times daily check blood sugars before meals and at bedtime              CBG (last 3)  Recent Labs    12/25/19 0358 12/25/19 0732 12/25/19 1136  GLUCAP 293* 248* 272*   CBGs elevated increase lantus dose 14U BID   2/28 improving control, not eating much though  -reduce lantus while npo tp 6u bid,hold scheduled novolog  3/1- will monitor because changing to cortrak/TFs and will get TF orders via Nutrition.    3/2: Changed to Cortrak orders today. Discussed with Linna Hoff, PA plans to restart TF.   3/3: TF restarted.   3/4: Appreciate nephrology and hospitalist follow-up. Uncontrolled hyperglycemia is likely 2/2 to D which is needed for hypernatremia.   3/5: D5 discontinued by hospitalist given hyperglycemia. 278 glucose this morning.   3/6: Appreciate hospitalist recs: Novolog and Lantus increased.  10.  CAD with stenting as well as pacemaker.  Continue aspirin and Plavix. 11.  CKD stage IIIb.  Creatinine baseline 1.56.               Cr 1.26 on 2/25  3/1- Cr 1.52- at baseline  3/5: nephrology started on IV fluid. Cr improved.   3/7: creatinine continues to improve.  12.  Chronic diastolic congestive heart failure.  Monitor for any signs of fluid overload             Daily weights 13.  OSA.  CPAP. 14.  Hyperlipidemia.  Lipitor 15.  Chronic normocytic anemia.  Continue iron supplement             hgb 10 16.  Post stroke dysphagia: Dysphagia #2 thin liquids.  Follow-up speech therapy.  Advance as tolerated  2/27 diet downgraded to D1/nectar given pontine infarct/presentation   -aspiration precautions, close observation   -low threshold to make npo, re-check cxr  2/28 still struggling with mgt of secretions d/t oro-pharyngeal weakness.   -make  npo   -start IVF for now   -MBS tomorrow  3/2: see diet/nutrition.  17.  UTI/Citrobacter/EColi: S to . Keflex finish 7d course   -low grade temp yesterday  -change keflex to rocephin1g daily for 2 days  3/1- completed today- wil recheck CBC tomorrow to make sure not increasing after finishing ABX. 3/2: continuing to increase. Will trend tomorrow.  18. Hypernatremia  3/1- needs more fluids- per IM, ordered D5water for now 100cc/hour- will reassess after Na has improved 19. Hypercalcemia  3/1- Ca++ is 13.4- concerning- has been an issue for awhile per IM notes- will get sodium controlled and see if Ca improves as well with fluids- if not, needs additional w/u- some work up already started-  Agree with PTH, PTHrP, TSH, 25vitD, 1,25VitD, SPEP- many of which are already being done.   3/2: will repeat tomorrow.   3/3 Ca improved this morning. Appreciate nephrology involvement: Sensipar 8m BID started.   3/6: Ca down to 11.2. However, Sensipar can not be crushed.   3/7: Ca continues to improve.  20. Disposition: Appreciate DDorien ChihuahuaRN, SW discussed patient care conference with family yesterday and family expressed understanding.      LOS: 11 days A FACE TO FACE EVALUATION WAS PERFORMED  Sheily Lineman P Jobanny Mavis 12/25/2019, 1:06 PM

## 2019-12-25 NOTE — Progress Notes (Signed)
Pt not able to use CPAP at this time.  RT will continue to monitor.

## 2019-12-25 NOTE — Progress Notes (Signed)
Speech Language Pathology Daily Session Note  Patient Details  Name: [name] MRN: [id number] Date of Birth: 10/30/1927  Today's Date: 12/25/2019 SLP Individual Time: 6468-0321 SLP Individual Time Calculation (min): 16 min  Short Term Goals: Week 2: SLP Short Term Goal 1 (Week 2): Pt will consume trials of ice chips, demonstrating inititaion of swallow sequence in 1/5 opportunities with Max A multimodal cues. SLP Short Term Goal 2 (Week 2): Pt will sustain attention to functional familiar tasks for 2 minute intervals with Max A multimodal cues. SLP Short Term Goal 3 (Week 2): Pt will demonstrate ability to problem solve during basic functional tasks with Max A verbal/visual cues. SLP Short Term Goal 4 (Week 2): Pt will demonstrate verbal and physical initiation in 50% opportunities with Max A multimodal cues. SLP Short Term Goal 5 (Week 2): Pt orient to place and time with Min A verbal/visual cues for use of aids. SLP Short Term Goal 6 (Week 2): Pt will respond to Mod A multimodal cues for use of increased vocal intensity to increase speech intelligibility at the word or short phrase level.  Skilled Therapeutic Interventions:  Pt was seen for skilled ST targeting goals for dysphagia.  Upon arrival pt was asleep but quickly awakened to voice.  Pt was repositioned to a moe upright seated position in the bed, blinds were opened, and oral care was provided all in the hopes of maximizing pt's alertness for participation in therapy.  Despite being quickly awakened upon arrival and interventions listed above, pt remained lethargic and was only able to keep her eyes open for a few seconds at a time.  At rest, pt's respirations were wet due to suspected pooling of secretions in the pharynx and cues for a volitional cough were ineffective for clearing.  Suctioning was also nonproductive as secretions were likely pooling below the level were bedside suctioning can reach safely (pyriforms or vocal cords  versus base of tongue or vallecula).  SLP rubbed ice chips on pt's lips to try to facilitate improved alertness and attention to boluses; however, despite cuing pt was unable to lick residual liquids off of her lips.  As a result, session was ended early.  Pt was left in bed with bed alarm set and call bell within reach.  Continue per current plan of care.    Pain Pain Assessment Pain Scale: 0-10 Pain Score: 0-No pain  Therapy/Group: Individual Therapy  [name], Selinda Orion 12/25/2019, 9:58 AM

## 2019-12-25 NOTE — Progress Notes (Signed)
PROGRESS NOTE  [name] GGY:[id number] DOB: [birth year]   PCP: [name], MD  DOA: 12/14/2019 LOS: 11  Brief Narrative / Interim history: 86 y.o.femaleHTN, heart block s/p PM, DM type 2, OSA on CPAP, CAD s/p PCI, CVA recently hospitalized (2/16-2/24) with rt sided hemiparesis and dysphagia, and discharged to CIR. Hospital course complicated by UTI with Citrobacter and E.Coli treated with Meropenem and transitioned toKelflex.  TRH consulted on 3/1 for "medical management". On 2/27 she was downgraded from dysphagia 2 diet to a dysphagia 1 diet, and on 2/28 made n.p.o. due to difficulty in handling secretions. Current plan is for placement of NGT tube. Patient was switched to Rocephin to complete antibiotic course for UTI.    Labs significant for WBC 10.7, hemoglobin 10.8 sodium 154, BUN 32, creatinine 1.52, andcalcium 13.4  Nephrology consulted as well.  Subjective: No major events overnight or this morning.  No complaints but minimally verbal from stroke.  Sleeping comfortably but wakes to voice.  Does not appear to be in distress.  CBG remains in 200s.  Creatinine improving.  Objective: Vitals:   12/24/19 0309 12/24/19 1447 12/24/19 2011 12/25/19 0401  BP: (!) 156/59 103/86 (!) 159/66 (!) 156/61  Pulse: 70 74 70 81  Resp: 18 14 17 20   Temp: 97.9 F (36.6 C)  98.7 F (37.1 C) 97.8 F (36.6 C)  TempSrc: Oral  Oral Oral  SpO2: 97% 98% 100% 100%  Weight: 79.2 kg     Height:        Intake/Output Summary (Last 24 hours) at 12/25/2019 1423 Last data filed at 12/25/2019 0830 Gross per 24 hour  Intake 10 ml  Output 1200 ml  Net -1190 ml   Filed Weights   12/21/19 0404 12/22/19 0500 12/24/19 0309  Weight: 78.3 kg 79.8 kg 79.2 kg    Examination:  GENERAL: No acute distress.  Appears well.  HEENT: MMM.  Vision and hearing grossly intact.  NECK: Supple.  No apparent JVD.  RESP:  No IWOB.  Fair aeration bilaterally. CVS:  RRR. Heart sounds normal.  ABD/GI/GU:  Bowel sounds present. Soft. Non tender.  MSK/EXT:  Moves extremities. No apparent deformity.  Trace pedal edema. SKIN: no apparent skin lesion or wound NEURO: Sleepy but wakes to voice easily.  Significant aphasia with right hemiparesis. PSYCH: Calm. Normal affect.  Procedures:  None  Assessment & Plan: Hypercalcemia: Likely a combination of dehydration, immobilization and primary hyperparathyroidism.  -Ca 13.4> 10.8 -Nephrology tarted Sensipar 30 mg twice daily  Hyponatremia: Resolved with D5 and free water -Discontinued D5 in the setting of hyperglycemia -Continue free water  Uncontrolled diabetes with hyperglycemia: Required a total of 92 units in the last 24 hours. Recent Labs    12/25/19 0358 12/25/19 0732 12/25/19 1136  GLUCAP 293* 248* 272*  -Continue SSI-moderate to q4h while on TF -Increase NovoLog from 4-6 units every 4 hours -Increase Lantus from 12-15 units twice daily -Recommend 10 to 15% increment of total daily insulin divided equally between basal and short acting for a goal CBG of 110-150. -Continue statin  Subacute CVA with right-sided hemiparesis -Per primary-on statin, Plavix and aspirin -PT/OT/SLP  Dysphagia: Downgraded from dysphagia to 2 dysphagia 1, then to NPO.  Now on TF with cortrack -Continue tube feed per primary  Hypokalemia: Resolved.  AKI on CKD-3b: prerenal azotemia partly due to dehydration in the setting of poor p.o. intake.  Renal function improved. -Free water and tube feed as above  Urinary tract infection: Urine culture with  E. coli and Citrobacter. -Completed antibiotics with meropenem> Keflex> ceftriaxone  Anemia of chronic disease: H&H relatively stable -Continue monitoring -Continue iron  Essential hypertension: Normotensive -Continue current regimen  History of complete AVB s/p PPM     Nutrition Problem: Inadequate oral intake Etiology: inability to eat, dysphagia  Signs/Symptoms: NPO status  Interventions: Tube  feeding   DVT prophylaxis: Subcu Lovenox Code Status: Full code  Consultants: We are signing off at this time. Discussed recommendations with primary, Dr. Ranell Patrick. Available if any question.    Microbiology summarized: None  Sch Meds:  Scheduled Meds: . amLODipine  2.5 mg Per Tube Daily  . aspirin  81 mg Per J Tube Daily  . atorvastatin  80 mg Per Tube q morning - 10a  . brimonidine  1 drop Both Eyes BID  . carvedilol  3.125 mg Per Tube BID  . chlorhexidine  15 mL Mouth Rinse BID  . cinacalcet  30 mg Oral BID WC  . clopidogrel  75 mg Per Tube Daily  . donepezil  10 mg Per Tube QHS  . dorzolamide  1 drop Both Eyes TID  . enoxaparin (LOVENOX) injection  30 mg Subcutaneous Daily  . feeding supplement (PRO-STAT SUGAR FREE 64)  30 mL Per Tube Daily  . ferrous sulfate  300 mg Oral Q breakfast  . free water  140 mL Per Tube Q6H  . insulin aspart  0-15 Units Subcutaneous Q4H  . insulin aspart  6 Units Subcutaneous Q4H  . insulin glargine  15 Units Subcutaneous BID  . latanoprost  1 drop Both Eyes QHS  . mouth rinse  15 mL Mouth Rinse q12n4p  . pantoprazole sodium  40 mg Per Tube QAC breakfast  . polyethylene glycol  17 g Per Tube BID  . senna-docusate  1 tablet Per Tube BID  . sodium chloride flush  10-40 mL Intracatheter Q12H   Continuous Infusions: . sodium chloride 75 mL/hr at 12/22/19 1312  . feeding supplement (JEVITY 1.2 CAL) 1,000 mL (12/25/19 1143)   PRN Meds:.acetaminophen **OR** acetaminophen (TYLENOL) oral liquid 160 mg/5 mL **OR** acetaminophen, ipratropium-albuterol, ondansetron, sodium chloride flush, sorbitol  Antimicrobials: Anti-infectives (From admission, onward)   Start     Dose/Rate Route Frequency Ordered Stop   12/18/19 0945  cefTRIAXone (ROCEPHIN) 1 g in sodium chloride 0.9 % 100 mL IVPB     1 g 200 mL/hr over 30 Minutes Intravenous Every 24 hours 12/18/19 0932 12/19/19 1050   12/14/19 2000  meropenem (MERREM) 1 g in sodium chloride 0.9 % 100 mL IVPB   Status:  Discontinued     1 g 200 mL/hr over 30 Minutes Intravenous Every 12 hours 12/14/19 1619 12/14/19 1639   12/14/19 2000  cephALEXin (KEFLEX) capsule 500 mg  Status:  Discontinued     500 mg Oral Every 12 hours 12/14/19 1639 12/18/19 0932       I have personally reviewed the following labs and images: CBC: Recent Labs  Lab 12/19/19 0434 12/20/19 0353 12/21/19 0350 12/22/19 0648 12/23/19 0552  WBC 10.7* 11.1* 10.6* 11.3* 11.1*  NEUTROABS  --  7.4 6.7  --   --   HGB 10.8* 10.5* 10.4* 9.5* 9.5*  HCT 36.0 35.3* 34.2* 30.6* 30.5*  MCV 97.0 97.0 95.0 92.4 93.3  PLT 277 241 227 222 227   BMP &GFR Recent Labs  Lab 12/21/19 0350 12/22/19 0648 12/23/19 0552 12/24/19 0534 12/25/19 0521  NA 138 143 143 142 141  K 3.2* 4.4 4.1 4.2 3.9  CL 104 108 109 104 104  CO2 26 28 28 30 29   GLUCOSE 388* 397* 299* 312* 287*  BUN 22 27* 23 21 20   CREATININE 1.33* 1.49* 1.28* 1.18* 1.15*  CALCIUM 11.5* 11.3* 11.2* 11.2* 10.8*  MG  --  2.1 2.0 2.0 1.8  PHOS  --  1.9* 2.1* 2.4* 2.1*   Estimated Creatinine Clearance: 35.9 mL/min (A) (by C-G formula based on SCr of 1.15 mg/dL (H)). Liver & Pancreas: Recent Labs  Lab 12/22/19 0648 12/23/19 0552 12/24/19 0534 12/25/19 0521  ALBUMIN 2.4* 2.4* 2.6* 2.5*   No results for input(s): LIPASE, AMYLASE in the last 168 hours. No results for input(s): AMMONIA in the last 168 hours. Diabetic: No results for input(s): HGBA1C in the last 72 hours. Recent Labs  Lab 12/24/19 2028 12/25/19 0012 12/25/19 0358 12/25/19 0732 12/25/19 1136  GLUCAP 303* 236* 293* 248* 272*   Cardiac Enzymes: No results for input(s): CKTOTAL, CKMB, CKMBINDEX, TROPONINI in the last 168 hours. No results for input(s): PROBNP in the last 8760 hours. Coagulation Profile: No results for input(s): INR, PROTIME in the last 168 hours. Thyroid Function Tests: No results for input(s): TSH, T4TOTAL, FREET4, T3FREE, THYROIDAB in the last 72 hours. Lipid Profile: No  results for input(s): CHOL, HDL, LDLCALC, TRIG, CHOLHDL, LDLDIRECT in the last 72 hours. Anemia Panel: No results for input(s): VITAMINB12, FOLATE, FERRITIN, TIBC, IRON, RETICCTPCT in the last 72 hours. Urine analysis:    Component Value Date/Time   COLORURINE YELLOW 12/12/2019 1030   APPEARANCEUR HAZY (A) 12/12/2019 1030   LABSPEC 1.015 12/12/2019 1030   PHURINE 6.0 12/12/2019 1030   GLUCOSEU NEGATIVE 12/12/2019 1030   HGBUR NEGATIVE 12/12/2019 1030   BILIRUBINUR NEGATIVE 12/12/2019 1030   KETONESUR NEGATIVE 12/12/2019 1030   PROTEINUR NEGATIVE 12/12/2019 1030   UROBILINOGEN 0.2 08/07/2015 1826   NITRITE NEGATIVE 12/12/2019 1030   LEUKOCYTESUR SMALL (A) 12/12/2019 1030   Sepsis Labs: Invalid input(s): PROCALCITONIN, [place]  Microbiology: No results found for this or any previous visit (from the past 240 hour(s)).  Radiology Studies: No results found.    Amandajo Gonder T. [place]  If 7PM-7AM, please contact night-coverage [email or web address] Password [facility] 12/25/2019, 2:23 PM

## 2019-12-25 NOTE — Progress Notes (Signed)
Occupational Therapy Session Note  Patient Details  Name: [name] MRN: [id number] Date of Birth: [birth year]  Today's Date: 12/25/2019 OT Individual Time: 7588-3254 OT Individual Time Calculation (min): 59 min   Short Term Goals: Week 2:  OT Short Term Goal 1 (Week 2): Pt will complete 1 grooming task at the sink with mod cues for sequencing OT Short Term Goal 2 (Week 2): Pt will complete sit<stand in [place] with 1 assist during toileting or dressing tasks OT Short Term Goal 3 (Week 2): Pt will complete 1 self care task EOB with Mod A for sitting balance  Skilled Therapeutic Interventions/Progress Updates:    Pt greeted in bed, stating "I don't know" what asked if brief was clean. It was clean, so we started session with donning pants bedlevel. Tx focus placed on following 1 step instruction, alertness, sustained attention, and attention to Rt side. Pt able to elevate her Lt leg with cuing for threading limb into pants. +2 assist for rolling Rt>Lt to elevate pants fully, and also for supine<sit. +2 assist Stedy transfer to TIS with pt exhibiting improved midline and ability to keep trunk extended vs flexed over Stedy bar. While sitting upright in TIS, pt doffed shirt, donned tank top and also zip up hoodie, and applied lotion. Pt able to select the shirt she wanted to wear via pointing with increased time. Note that pt often needs assist to release her grasp from items, [facility] for initiation when assisting during all stated tasks. Note that alertness decreased as session progressed with pt needing multimodal cuing to keep her eyes open. She did indicate that she had some Rt shoulder pain during session, notified RN and provided full lap tray to her TIS for improved positioning of upper limb when OOB. Left pt reclined for comfort and pressure relief, call bell in hand and safety belt fastened.    Therapy Documentation Precautions:  Precautions Precautions: Fall Restrictions Weight Bearing  Restrictions: No Vital Signs: Therapy Vitals Temp: 98.3 F (36.8 C) Temp Source: Oral Pulse Rate: 75 Resp: 16 BP: (!) 123/44 Patient Position (if appropriate): Sitting Oxygen Therapy SpO2: 96 % O2 Device: Room Air \ADL:       Therapy/Group: Individual Therapy  [name] 12/25/2019, 4:57 PM

## 2019-12-26 ENCOUNTER — Inpatient Hospital Stay (HOSPITAL_COMMUNITY): Payer: Medicare Other | Admitting: Speech Pathology

## 2019-12-26 ENCOUNTER — Inpatient Hospital Stay (HOSPITAL_COMMUNITY): Payer: Medicare Other | Admitting: Occupational Therapy

## 2019-12-26 ENCOUNTER — Inpatient Hospital Stay (HOSPITAL_COMMUNITY): Payer: Medicare Other | Admitting: Physical Therapy

## 2019-12-26 LAB — RENAL FUNCTION PANEL
Albumin: 2.5 g/dL — ABNORMAL LOW (ref 3.5–5.0)
Anion gap: 6 (ref 5–15)
BUN: 23 mg/dL (ref 8–23)
CO2: 30 mmol/L (ref 22–32)
Calcium: 9.9 mg/dL (ref 8.9–10.3)
Chloride: 103 mmol/L (ref 98–111)
Creatinine, Ser: 1.21 mg/dL — ABNORMAL HIGH (ref 0.44–1.00)
GFR calc Af Amer: 46 mL/min — ABNORMAL LOW (ref >60)
GFR calc non Af Amer: 40 mL/min — ABNORMAL LOW (ref >60)
Glucose, Bld: 238 mg/dL — ABNORMAL HIGH (ref 70–99)
Phosphorus: 2.9 mg/dL (ref 2.5–4.6)
Potassium: 3.8 mmol/L (ref 3.5–5.1)
Sodium: 139 mmol/L (ref 135–145)

## 2019-12-26 LAB — GLUCOSE, CAPILLARY
Glucose-Capillary: 157 mg/dL — ABNORMAL HIGH (ref 70–99)
Glucose-Capillary: 159 mg/dL — ABNORMAL HIGH (ref 70–99)
Glucose-Capillary: 170 mg/dL — ABNORMAL HIGH (ref 70–99)
Glucose-Capillary: 206 mg/dL — ABNORMAL HIGH (ref 70–99)
Glucose-Capillary: 209 mg/dL — ABNORMAL HIGH (ref 70–99)
Glucose-Capillary: 234 mg/dL — ABNORMAL HIGH (ref 70–99)

## 2019-12-26 LAB — CBC
HCT: 30.4 % — ABNORMAL LOW (ref 36.0–46.0)
Hemoglobin: 9.3 g/dL — ABNORMAL LOW (ref 12.0–15.0)
MCH: 28.5 pg (ref 26.0–34.0)
MCHC: 30.6 g/dL (ref 30.0–36.0)
MCV: 93.3 fL (ref 80.0–100.0)
Platelets: 190 10*3/uL (ref 150–400)
RBC: 3.26 MIL/uL — ABNORMAL LOW (ref 3.87–5.11)
RDW: 12.5 % (ref 11.5–15.5)
WBC: 10.3 10*3/uL (ref 4.0–10.5)
nRBC: 0 % (ref 0.0–0.2)

## 2019-12-26 LAB — MAGNESIUM: Magnesium: 1.8 mg/dL (ref 1.7–2.4)

## 2019-12-26 MED ORDER — INSULIN GLARGINE 100 UNIT/ML ~~LOC~~ SOLN
17.0000 [IU] | Freq: Two times a day (BID) | SUBCUTANEOUS | Status: DC
  Administered 2019-12-26 – 2019-12-29 (×6): 17 [IU] via SUBCUTANEOUS
  Filled 2019-12-26 (×8): qty 0.17

## 2019-12-26 NOTE — Progress Notes (Signed)
Occupational Therapy Session Note  Patient Details  Name: [name] MRN: [id number] Date of Birth: 01-Jan-1928  Today's Date: 12/26/2019 OT Individual Time: 1003-1100 OT Individual Time Calculation (min): 57 min   Short Term Goals: Week 2:  OT Short Term Goal 1 (Week 2): Pt will complete 1 grooming task at the sink with mod cues for sequencing OT Short Term Goal 2 (Week 2): Pt will complete sit<stand in [place] with 1 assist during toileting or dressing tasks OT Short Term Goal 3 (Week 2): Pt will complete 1 self care task EOB with Mod A for sitting balance  Skilled Therapeutic Interventions/Progress Updates:    Pt greeted in bed, denying pain and agreeable to use the [facility]. During supine<sit she initiated trunk elevation, Mod A to elevate trunk to reach EOB! Set pt up in [place], +2 for sit<stand and for transfer to elevated BSC. Worked on sitting balance while voiding, with pt needing Mod A to correct Rt LOBs and forward LOBs. Note that she had a great deal of bowel incontinence in brief that leaked out, soiling lower back and lower belly folds. Bathing completed with Total A while she sat to void. Pt able to void bowels more and hygiene was completed at sit<stand level using Stedy. Due to messiness of liquid BM and pts fatigue, 3rd person required for hygiene while +2 assisted pt with supported standing balance. After hygiene was completed, pt with another liquid BM on the floor. Pt transferred to bed and required +3 assist for hygiene, bed change, and dressing tasks. Pt remained in bed at end of session with all needs within reach and bed alarm set. Pt asleep before OT left the room.    Therapy Documentation Precautions:  Precautions Precautions: Fall Restrictions Weight Bearing Restrictions: No   Vital Signs: Therapy Vitals Temp: 98.6 F (37 C) Temp Source: Oral Pulse Rate: 73 BP: (!) 135/50 Patient Position (if appropriate): Lying Oxygen Therapy SpO2: 99 % O2 Device: Room  Air ADL:           :     Therapy/Group: Individual Therapy  Millicent Blazejewski A Tanner Vigna 12/26/2019, 3:34 PM

## 2019-12-26 NOTE — Progress Notes (Signed)
[facility] PHYSICAL MEDICINE & REHABILITATION PROGRESS NOTE   Subjective/Complaints:  Occ says Yes or no awakens to voice and keeps eyes open  Oriented to person only  ROS: pt nonverbal this AM- couldn't get ROS Objective:   No results found. Recent Labs    12/26/19 0518  WBC 10.3  HGB 9.3*  HCT 30.4*  PLT 190   Recent Labs    12/25/19 0521 12/26/19 0518  NA 141 139  K 3.9 3.8  CL 104 103  CO2 29 30  GLUCOSE 287* 238*  BUN 20 23  CREATININE 1.15* 1.21*  CALCIUM 10.8* 9.9    Intake/Output Summary (Last 24 hours) at 12/26/2019 1119 Last data filed at 12/26/2019 0820 Gross per 24 hour  Intake 10 ml  Output 650 ml  Net -640 ml     Physical Exam: Vital Signs Blood pressure (!) 111/47, pulse 70, temperature 98 F (36.7 C), resp. rate 19, height 5' 7"  (1.702 m), weight 79.2 kg, SpO2 100 %.   Constitutional: No distress . Vital signs reviewed. Lying in bed, sleeping soundly  General: No acute distress Mood and affect are appropriate Heart: Regular rate and rhythm no rubs murmurs or extra sounds Lungs: Clear to auscultation, breathing unlabored, no rales or wheezes, wet upper airway sounds  Abdomen: Positive bowel sounds, soft nontender to palpation, nondistended Extremities: No clubbing, cyanosis, or edema Skin: No evidence of breakdown, no evidence of rash Neurologic: Cranial nerves II through XII intact, motor strength is 4/5 inleft and 0/5 RIght deltoid, bicep, tricep, grip, hip flexor, knee extensors, ankle dorsiflexor and plantar flexor Sensory exam winces to pinch RUE and RLE  Musculoskeletal: Full range of motion in all 4 extremities. No joint swelling   Assessment/Plan: 1. Functional deficits secondary to Left brainstem vs subcortical infarct which require 3+ hours per day of interdisciplinary therapy in a comprehensive inpatient rehab setting.  Physiatrist is providing close team supervision and 24 hour management of active medical problems listed  below.  Physiatrist and rehab team continue to assess barriers to discharge/monitor patient progress toward functional and medical goals  Care Tool:  Bathing  Bathing activity did not occur: Safety/medical concerns Body parts bathed by patient: Face   Body parts bathed by helper: Right arm, Left arm, Chest, Abdomen, Front perineal area, Buttocks, Right upper leg, Left upper leg, Right lower leg, Left lower leg     Bathing assist Assist Level: 2 Helpers     Upper Body Dressing/Undressing Upper body dressing   What is the patient wearing?: Hospital gown only    Upper body assist Assist Level: Total Assistance - Patient < 25%    Lower Body Dressing/Undressing Lower body dressing      What is the patient wearing?: Incontinence brief     Lower body assist Assist for lower body dressing: 2 Helpers     Toileting Toileting Toileting Activity did not occur (Clothing management and hygiene only): N/A (no void or bm)  Toileting assist Assist for toileting: Dependent - Patient 0%(3 helpers)     Transfers Chair/bed transfer  Transfers assist  Chair/bed transfer activity did not occur: Safety/medical concerns  Chair/bed transfer assist level: Dependent - mechanical lift     Locomotion Ambulation   Ambulation assist   Ambulation activity did not occur: Safety/medical concerns          Walk 10 feet activity   Assist  Walk 10 feet activity did not occur: Safety/medical concerns        Walk 50  feet activity   Assist Walk 50 feet with 2 turns activity did not occur: Safety/medical concerns         Walk 150 feet activity   Assist Walk 150 feet activity did not occur: Safety/medical concerns         Walk 10 feet on uneven surface  activity   Assist Walk 10 feet on uneven surfaces activity did not occur: Safety/medical concerns         Wheelchair     Assist Will patient use wheelchair at discharge?: Yes Type of Wheelchair:  Manual Wheelchair activity did not occur: Safety/medical concerns         Wheelchair 50 feet with 2 turns activity    Assist    Wheelchair 50 feet with 2 turns activity did not occur: Safety/medical concerns       Wheelchair 150 feet activity     Assist  Wheelchair 150 feet activity did not occur: Safety/medical concerns       Blood pressure (!) 111/47, pulse 70, temperature 98 F (36.7 C), resp. rate 19, height 5' 7"  (1.702 m), weight 79.2 kg, SpO2 100 %.    Medical Problem List and Plan: 1.  Rightt side hemiplegia, dysarthria, +/- aphasia secondary to left brain infarction.  MRI not completed due to pacemaker  -CT demonstrates hypoattenuation in left pons which is consistent with current presentation---increased right hp and increased difficulty with oro-pharyngeal control             -patient may shower             -ELOS/Goals: 25-30 days/min/mod a             Continue PT, OT, SLP   -team conference conducted on 3/3, change to 15/7 2.  Antithrombotics: -DVT/anticoagulation: Subcutaneous Lovenox             -antiplatelet therapy: Continue Aspirin 81 mg daily, Plavix 75 mg daily 3. Pain Management: ContinueTylenol as needed, pain is well controlled.  4. Mood with memory deficits.  Aricept 10 mg nightly             -antipsychotic agents: N/A 5. Neuropsych: This patient is NOT capable of making decisions on her own behalf. 6. Skin/Wound Care: Routine skin checks 7. Fluids/Electrolytes/Nutrition: Routine in and outs.  CMP ordered for tomorrow a.m. 8.  Hypertension.  Norvasc 2.5 mg daily, Coreg 3.125 mg twice daily.               Monitor with increased mobility Vitals:   12/25/19 1952 12/26/19 0344  BP: (!) 148/53 (!) 111/47  Pulse: 73 70  Resp: 16 19  Temp: 97.9 F (36.6 C) 98 F (36.7 C)  SpO2: 100% 100%   Controlled this am, PMR now managing  9.  Diabetes mellitus with hyperglycemia.  Hemoglobin A1c 8.2.  Lantus insulin 10 units twice daily, NovoLog 2  units 3 times daily check blood sugars before meals and at bedtime              CBG (last 3)  Recent Labs    12/25/19 2356 12/26/19 0340 12/26/19 0858  GLUCAP 209* 206* 234*   PMR now managing increase lantus from 15U BID to 17 U BID on 3/9 10.  CAD with stenting as well as pacemaker.  Continue aspirin and Plavix. 11.  CKD stage IIIb.  Creatinine baseline 1.56.               Cr 1.26 on 2/25  3/1- Cr 1.52- at  baseline  3/5: nephrology started on IV fluid. Cr improved.   3/7: creatinine continues to improve.  12.  Chronic diastolic congestive heart failure.  Monitor for any signs of fluid overload             Daily weights 13.  OSA.  CPAP. 14.  Hyperlipidemia.  Lipitor 15.  Chronic normocytic anemia.  Continue iron supplement             hgb 10 16.  Post stroke dysphagia: Dysphagia #2 thin liquids.  Follow-up speech therapy.  Advance as tolerated    3/2: see diet/nutrition.  17.  UTI/Citrobacter/EColi: S to . Keflex finish 7d course   -low grade temp yesterday  -change keflex to rocephin1g daily for 2 days  3/1- completed today- wil recheck CBC tomorrow to make sure not increasing after finishing ABX. 3/2: continuing to increase. Will trend tomorrow.  18. Hypernatremia  3/1- needs more fluids- per IM, ordered D5water for now 100cc/hour- will reassess after Na has improved 19. Hypercalcemia  20.  Severe Dysphagia, NPO- still has problems handling secretions , will discuss with SLP, may need PEG, family would need to make decision     Disposition: Appreciate Dorien Chihuahua RN, SW discussed patient care conference with family yesterday and family expressed understanding.      LOS: 12 days A FACE TO FACE EVALUATION WAS PERFORMED  Charlett Blake 12/26/2019, 11:19 AM

## 2019-12-26 NOTE — Progress Notes (Signed)
Physical Therapy Session Note  Patient Details  Name: [name] MRN: [id number] Date of Birth: 1928-03-23  Today's Date: 12/26/2019 PT Individual Time: 1420-1530 PT Individual Time Calculation (min): 70 min   Short Term Goals: Week 2:  PT Short Term Goal 1 (Week 2): Pt will maintain sitting balance EOB with min assist consistently up to 3 minutes. PT Short Term Goal 2 (Week 2): Pt will transfer to [facility] with max assist of 1 person. PT Short Term Goal 3 (Week 2): Pt will perform bed mobility with max assis tof 1 consistently  Skilled Therapeutic Interventions/Progress Updates: Pt presented in bed sleeping but easily aroused and agreeable to therapy. Performed rolling L/R maxA to check for incontinence and pt noted to be clean and dry. Performed rolling to L maxA and performed supine to sit at EOB with maxA x 2. Once pt seated at EOB participated in sitting balance with EOB activities x 10 min with pt requiring overall modA for sitting balance due to increased anterior or R lateral lean. Pt was able to correct to neutral with multimodal cues for trunk activation and tactile cues to press L hip into bed to correct R lateral lean. Pt also participated in sitting reaching activities with LUE reaching to L to provide stretch to R trunk. Pt then performed STS from stedy with maxA x 2 and transfer to [place]. Nsg notified of darkened area on lateral aspect of R heel and nsg inspected while in TIS. Pt transported to rehab gym and participated in STS from parallel bars requiring maxA x 2 (+2 present to primarily facilitate at hips during transition). Pt was able to perform x 2 full standing in bars with PTA providing facilitation at hips to improve erect posture and blocking RLE. Pt then participated in reaching activities with Dynavision with pt requiring increased time to initiate activity however was able to scan both L/R and reach for buttons with LUE with min cues. Pt transported back to room at end of  session and remained in TIS with belt alarm placed, call bell within reach and needs met.      Therapy Documentation Precautions:  Precautions Precautions: Fall Restrictions Weight Bearing Restrictions: No General:   Vital Signs: Therapy Vitals Temp: 98.6 F (37 C) Temp Source: Oral Pulse Rate: 73 BP: (!) 135/50 Patient Position (if appropriate): Lying Oxygen Therapy SpO2: 99 % O2 Device: Room Air   Therapy/Group: Individual Therapy  Haniyah Maciolek  Darnella Zeiter, PTA  12/26/2019, 4:29 PM

## 2019-12-26 NOTE — Plan of Care (Signed)
  Problem: RH BOWEL ELIMINATION Goal: RH STG MANAGE BOWEL WITH ASSISTANCE Description: STG Manage Bowel with Moderate Assistance. Outcome: Progressing   Problem: RH BLADDER ELIMINATION Goal: RH STG MANAGE BLADDER WITH ASSISTANCE Description: STG Manage Bladder With Moderate Assistance Outcome: Progressing   Problem: RH SKIN INTEGRITY Goal: RH STG SKIN FREE OF INFECTION/BREAKDOWN Description: Patient will verbalize how to prevent skin breakdown during hospitalization. Outcome: Progressing Goal: RH STG MAINTAIN SKIN INTEGRITY WITH ASSISTANCE Description: STG Maintain Skin Integrity With Moderate Assistance. Outcome: Progressing   Problem: RH SAFETY Goal: RH STG ADHERE TO SAFETY PRECAUTIONS W/ASSISTANCE/DEVICE Description: STG Adhere to Safety Precautions With Moderate Assistance/Device. Outcome: Progressing   Problem: RH COGNITION-NURSING Goal: RH STG USES MEMORY AIDS/STRATEGIES W/ASSIST TO PROBLEM SOLVE Description: STG Uses Memory Aids/Strategies With Moderate Assistance to Problem Solve. Outcome: Progressing   Problem: RH PAIN MANAGEMENT Goal: RH STG PAIN MANAGED AT OR BELOW PT'S PAIN GOAL Description: <5 Outcome: Progressing   Problem: RH KNOWLEDGE DEFICIT Goal: RH STG INCREASE KNOWLEDGE OF DIABETES Outcome: Progressing Goal: RH STG INCREASE KNOWLEDGE OF HYPERTENSION Outcome: Progressing Goal: RH STG INCREASE KNOWLEDGE OF DYSPHAGIA/FLUID INTAKE Outcome: Progressing

## 2019-12-26 NOTE — Progress Notes (Signed)
Speech Language Pathology Daily Session Note  Patient Details  Name: DALONDA SIMONI MRN: [id number] Date of Birth: Nov 19, 1927  Today's Date: 12/26/2019 SLP Individual Time: 8466-5993 SLP Individual Time Calculation (min): 45 min  Short Term Goals: Week 2: SLP Short Term Goal 1 (Week 2): Pt will consume trials of ice chips, demonstrating inititaion of swallow sequence in 1/5 opportunities with Max A multimodal cues. SLP Short Term Goal 2 (Week 2): Pt will sustain attention to functional familiar tasks for 2 minute intervals with Max A multimodal cues. SLP Short Term Goal 3 (Week 2): Pt will demonstrate ability to problem solve during basic functional tasks with Max A verbal/visual cues. SLP Short Term Goal 4 (Week 2): Pt will demonstrate verbal and physical initiation in 50% opportunities with Max A multimodal cues. SLP Short Term Goal 5 (Week 2): Pt orient to place and time with Min A verbal/visual cues for use of aids. SLP Short Term Goal 6 (Week 2): Pt will respond to Mod A multimodal cues for use of increased vocal intensity to increase speech intelligibility at the word or short phrase level.  Skilled Therapeutic Interventions: Pt was seen for skilled ST targeting dysphagia and communication goals. Pt's audible respirations and vocal quality sounded very gurgly upon arrival to room. Pt requires Max A multimodal cues for secretion management at this time, but was able to follow commands to mobilize a significant amount of thick mocous-like secretions from pharynx into posterior oral cavity, which SLP could then carefully remove via oral suction. Although vocal quality sounded clear for 2-3 minutes after suctioning, wet vocal quality returned several times throughout session requiring continuous verbal cues for throat clear and suctioning for secretion management by SLP. When presented with ice chip trial (after oral care), pt demonstrated very visibly effortful attempt to transit bolus  posteriorly and initiate swallow sequence by rocking her head back and forth, however was ultimately unsuccessful in initiating a swallow (based on observation and palpation of hyoid), and a delayed cough response noted. Suctioning used to remove remaining melted ice from oral cavity.  During word level speech tasks targeting intelligibility strategies, pt was only able to successfully use increased vocal intensity with Mod A verbal cues. However, she was unable to follow other cues or overarticulation, "talking loud", or specific articulatory placement cues during either word or phoneme level speech tasks - suspect this was due to cognitive impairments as opposed to a true language comprehension deficit. Pt was oriented to place and month with Min A question cues this morning and ~50% intelligible at the word and phrase levels regardless of whether context was known or not. Pt became increasingly lethargic throughout session and subsequently missed 15 minutes skilled ST. Pt left laying in bed with alarm set and needs within reach. Continue per current plan of care.        Pain Pain Assessment Pain Scale: 0-10 Pain Score: 0-No pain  Therapy/Group: Individual Therapy  Arbutus Leas 12/26/2019, 8:05 AM

## 2019-12-27 ENCOUNTER — Inpatient Hospital Stay (HOSPITAL_COMMUNITY): Payer: Medicare Other | Admitting: Physical Therapy

## 2019-12-27 ENCOUNTER — Inpatient Hospital Stay (HOSPITAL_COMMUNITY): Payer: Medicare Other | Admitting: Speech Pathology

## 2019-12-27 ENCOUNTER — Inpatient Hospital Stay (HOSPITAL_COMMUNITY): Payer: Medicare Other | Admitting: Occupational Therapy

## 2019-12-27 LAB — GLUCOSE, CAPILLARY
Glucose-Capillary: 144 mg/dL — ABNORMAL HIGH (ref 70–99)
Glucose-Capillary: 169 mg/dL — ABNORMAL HIGH (ref 70–99)
Glucose-Capillary: 173 mg/dL — ABNORMAL HIGH (ref 70–99)
Glucose-Capillary: 176 mg/dL — ABNORMAL HIGH (ref 70–99)
Glucose-Capillary: 193 mg/dL — ABNORMAL HIGH (ref 70–99)
Glucose-Capillary: 193 mg/dL — ABNORMAL HIGH (ref 70–99)
Glucose-Capillary: 203 mg/dL — ABNORMAL HIGH (ref 70–99)

## 2019-12-27 NOTE — Plan of Care (Signed)
  Problem: Consults Goal: RH STROKE PATIENT EDUCATION Description: See Patient Education module for education specifics  Outcome: Progressing Goal: Nutrition Consult-if indicated Outcome: Progressing Goal: Diabetes Guidelines if Diabetic/Glucose > 140 Description: If diabetic or lab glucose is > 140 mg/dl - Initiate Diabetes/Hyperglycemia Guidelines & Document Interventions  Outcome: Progressing   Problem: RH BOWEL ELIMINATION Goal: RH STG MANAGE BOWEL WITH ASSISTANCE Description: STG Manage Bowel with Moderate Assistance. Outcome: Progressing   Problem: RH BLADDER ELIMINATION Goal: RH STG MANAGE BLADDER WITH ASSISTANCE Description: STG Manage Bladder With Moderate Assistance Outcome: Progressing   Problem: RH SKIN INTEGRITY Goal: RH STG SKIN FREE OF INFECTION/BREAKDOWN Description: Patient will verbalize how to prevent skin breakdown during hospitalization. Outcome: Progressing Goal: RH STG MAINTAIN SKIN INTEGRITY WITH ASSISTANCE Description: STG Maintain Skin Integrity With Moderate Assistance. Outcome: Progressing   Problem: RH SAFETY Goal: RH STG ADHERE TO SAFETY PRECAUTIONS W/ASSISTANCE/DEVICE Description: STG Adhere to Safety Precautions With Moderate Assistance/Device. Outcome: Progressing   Problem: RH PAIN MANAGEMENT Goal: RH STG PAIN MANAGED AT OR BELOW PT'S PAIN GOAL Description: <5 Outcome: Progressing   Problem: RH COGNITION-NURSING Goal: RH STG USES MEMORY AIDS/STRATEGIES W/ASSIST TO PROBLEM SOLVE Description: STG Uses Memory Aids/Strategies With Moderate Assistance to Problem Solve. Outcome: Progressing   Problem: RH KNOWLEDGE DEFICIT Goal: RH STG INCREASE KNOWLEDGE OF DIABETES Outcome: Progressing Goal: RH STG INCREASE KNOWLEDGE OF HYPERTENSION Outcome: Progressing Goal: RH STG INCREASE KNOWLEDGE OF DYSPHAGIA/FLUID INTAKE Outcome: Progressing   Problem: RH PAIN MANAGEMENT Goal: RH STG PAIN MANAGED AT OR BELOW PT'S PAIN GOAL Description:  <5 Outcome: Progressing

## 2019-12-27 NOTE — Progress Notes (Signed)
Nutrition Follow-up  **RD working remotely**  DOCUMENTATION CODES:   Not applicable  INTERVENTION:  Continue Jevity 1.2 @ 60 ml/hr via Cortrak  -30 ml Prostat daily.    -140 ml free water flushes every 6 hours  Tube feeding regimen provides 1828 kcal (100% of needs), 95 grams of protein, and 1162 ml of H2O. Total free water: 1722 ml  If pt is to remain NPO, consider PEG.  NUTRITION DIAGNOSIS:   Inadequate oral intake related to inability to eat, dysphagia as evidenced by NPO status.  Ongoing.  GOAL:   Patient will meet greater than or equal to 90% of their needs  Met with tube feeding.   MONITOR:   Diet advancement, Labs, Weight trends, TF tolerance, Skin, I & O's  REASON FOR ASSESSMENT:   Consult Enteral/tube feeding initiation and management  ASSESSMENT:   [name] is an 86 year old right-handed female with history of CAD with stenting, CKD stage III with creatinine 1.56, CVA maintained on Plavix, memory loss maintained on Aricept followed by Dr. Jannifer Franklin, heart block with pacemaker, hypertension, type 2 diabetes mellitus, OSA with CPAP, chronic normocytic anemia, chronic diastolic congestive heart failure.  History taken from chart review due to cognition.  Patient lives with family.  1 level home.  Used a cane prior to admission for mobility and daughter assist with some basic ADLs.  She presented on 12/07/2019 after fall without LOC.  She was noted to have slurred speech and left hemiparesis.  Cranial CT scan unremarkable for acute intracranial process.  Patient did not receive TPA.  MRI not completed due to pacemaker.  Echocardiogram with ejection fraction of 55% without emboli.  Neurology follow-up currently maintained on aspirin and Plavix for CVA prophylaxis subcutaneous Lovenox for DVT prophylaxis.  Admission chemistries with creatinine 1.62, SARS coronavirus negative.  Hospital course further complicated by postop dysphagia, on a dysphagia 2 thin liquid  diet.  Urinalysis study 12/12/2019 greater than 100,000 Citrobacter placed on Merrem 12/13/2019 for UTI.  Therapy evaluations completed and patient was admitted for a comprehensive rehab program.  Please see preadmission assessment from earlier today as well.  Pt admitted with lt side hemiplegia, dysarthria, and aphasia secondary to lt brain infarction.   2/25- s/p BSE- diet downgraded from dysphagia 2 diet with thin liquids to dysphagia 1 diet with thin liquids, due to fatigue; head CT revealed subacute left pontine infarct 2/26- s/p BSE- diet downgraded to dysphagia 1 with nectar thick liquids 3/1- s/p MBSS- recommend NPO, unsafe for PO intake at this time; NGT placed at bedside- tip of tube confirmed in stomach via KUB  3/2 - Cortrak (gastric)  Discussed pt with RN.   Per MD, pt noted to be nonverbal this morning.   If pt is to remain NPO, consider PEG.  Current tube feeding regimen: 40m Pro-stat daily, 1443mfree water Q6, Jevity 1.2 cal @ 6030mr  Medications reviewed and include: Sensipar, Ferrous sulfate, SSI, Novolog, Lantus, Miralax, Senokot-S  Labs reviewed. CBGs [id number] Order:   Diet Order            Diet NPO time specified  Diet effective now              EDUCATION NEEDS:   No education needs have been identified at this time  Skin:  Skin Assessment: Reviewed RN Assessment  Last BM:  12/27/19  Height:   Ht Readings from Last 1 Encounters:  12/14/19 5' 7"  (1.702 m)    Weight:   Wt  Readings from Last 1 Encounters:  12/27/19 80.3 kg    BMI:  Body mass index is 27.73 kg/m.  Estimated Nutritional Needs:   Kcal:  1700-1900  Protein:  85-100 grams  Fluid:  > 1.7 L    Larkin Ina, MS, RD, LDN RD pager number and weekend/on-call pager number located in [place].

## 2019-12-27 NOTE — Progress Notes (Signed)
[facility] PHYSICAL MEDICINE & REHABILITATION PROGRESS NOTE   Subjective/Complaints:  ROS: pt nonverbal this AM- couldn't get ROS Objective:   No results found. Recent Labs    12/26/19 0518  WBC 10.3  HGB 9.3*  HCT 30.4*  PLT 190   Recent Labs    12/25/19 0521 12/26/19 0518  NA 141 139  K 3.9 3.8  CL 104 103  CO2 29 30  GLUCOSE 287* 238*  BUN 20 23  CREATININE 1.15* 1.21*  CALCIUM 10.8* 9.9    Intake/Output Summary (Last 24 hours) at 12/27/2019 0835 Last data filed at 12/27/2019 0143 Gross per 24 hour  Intake --  Output 390 ml  Net -390 ml     Physical Exam: Vital Signs Blood pressure (!) 135/57, pulse 75, temperature 98.2 F (36.8 C), resp. rate 18, height 5' 7"  (1.702 m), weight 80.3 kg, SpO2 99 %.   Constitutional: No distress . Vital signs reviewed. Lying in bed, sleeping soundly   General: No acute distress Mood and affect are appropriate Heart: Regular rate and rhythm no rubs murmurs or extra sounds Lungs: Clear to auscultation, breathing unlabored, no rales or wheezes Abdomen: Positive bowel sounds, soft nontender to palpation, nondistended Extremities: No clubbing, cyanosis, or edema Skin: No evidence of breakdown, no evidence of rash Neurologic: Cranial nerves II through XII intact, motor strength is 1/5 in right , 4/5 Left  deltoid, bicep, tricep, grip, hip flexor, knee extensors, ankle dorsiflexor and plantar flexor  Musculoskeletal:  No joint swelling or hand deformities     Assessment/Plan: 1. Functional deficits secondary to Left brainstem vs subcortical infarct which require 3+ hours per day of interdisciplinary therapy in a comprehensive inpatient rehab setting.  Physiatrist is providing close team supervision and 24 hour management of active medical problems listed below.  Physiatrist and rehab team continue to assess barriers to discharge/monitor patient progress toward functional and medical goals  Care Tool:  Bathing  Bathing  activity did not occur: Safety/medical concerns Body parts bathed by patient: Face   Body parts bathed by helper: Right arm, Left arm, Chest, Abdomen, Front perineal area, Buttocks, Right upper leg, Left upper leg, Right lower leg, Left lower leg     Bathing assist Assist Level: 2 Helpers     Upper Body Dressing/Undressing Upper body dressing   What is the patient wearing?: Hospital gown only    Upper body assist Assist Level: Total Assistance - Patient < 25%    Lower Body Dressing/Undressing Lower body dressing      What is the patient wearing?: Incontinence brief     Lower body assist Assist for lower body dressing: 2 Helpers     Toileting Toileting Toileting Activity did not occur (Clothing management and hygiene only): N/A (no void or bm)  Toileting assist Assist for toileting: Dependent - Patient 0%(3 helpers)     Transfers Chair/bed transfer  Transfers assist  Chair/bed transfer activity did not occur: Safety/medical concerns  Chair/bed transfer assist level: Dependent - mechanical lift     Locomotion Ambulation   Ambulation assist   Ambulation activity did not occur: Safety/medical concerns          Walk 10 feet activity   Assist  Walk 10 feet activity did not occur: Safety/medical concerns        Walk 50 feet activity   Assist Walk 50 feet with 2 turns activity did not occur: Safety/medical concerns         Walk 150 feet activity  Assist Walk 150 feet activity did not occur: Safety/medical concerns         Walk 10 feet on uneven surface  activity   Assist Walk 10 feet on uneven surfaces activity did not occur: Safety/medical concerns         Wheelchair     Assist Will patient use wheelchair at discharge?: Yes Type of Wheelchair: Manual Wheelchair activity did not occur: Safety/medical concerns         Wheelchair 50 feet with 2 turns activity    Assist    Wheelchair 50 feet with 2 turns activity did  not occur: Safety/medical concerns       Wheelchair 150 feet activity     Assist  Wheelchair 150 feet activity did not occur: Safety/medical concerns       Blood pressure (!) 135/57, pulse 75, temperature 98.2 F (36.8 C), resp. rate 18, height 5' 7"  (1.702 m), weight 80.3 kg, SpO2 99 %.    Medical Problem List and Plan: 1.  Rightt side hemiplegia, dysarthria, +/- aphasia secondary to left brain infarction.  MRI not completed due to pacemaker  -CT demonstrates hypoattenuation in left pons which is consistent with current presentation---increased right hp and increased difficulty with oro-pharyngeal control             -patient may shower             -ELOS/Goals: 25-30 days/min/mod a             Continue PT, OT, SLP   -team conference in am  2.  Antithrombotics: -DVT/anticoagulation: Subcutaneous Lovenox             -antiplatelet therapy: Continue Aspirin 81 mg daily, Plavix 75 mg daily 3. Pain Management: ContinueTylenol as needed, pain is well controlled.  4. Mood with memory deficits.  Aricept 10 mg nightly             -antipsychotic agents: N/A 5. Neuropsych: This patient is NOT capable of making decisions on her own behalf. 6. Skin/Wound Care: Routine skin checks 7. Fluids/Electrolytes/Nutrition: Routine in and outs.  CMP ordered for tomorrow a.m. 8.  Hypertension.  Norvasc 2.5 mg daily, Coreg 3.125 mg twice daily.               Monitor with increased mobility Vitals:   12/26/19 2009 12/27/19 0403  BP: (!) 129/50 (!) 135/57  Pulse: 73 75  Resp: 20 18  Temp: 98 F (36.7 C) 98.2 F (36.8 C)  SpO2: 100% 99%   Controlled this am, cont above medications   9.  Diabetes mellitus with hyperglycemia.  Hemoglobin A1c 8.2.  Lantus insulin 10 units twice daily, NovoLog 2 units 3 times daily check blood sugars before meals and at bedtime              CBG (last 3)  Recent Labs    12/27/19 0001 12/27/19 0400 12/27/19 0817  GLUCAP 176* 193* 203*   PMR now managing increase  lantus from 15U BID to 17 U BID on 3/9, will cont to monitor but may need to increase again  10.  CAD with stenting as well as pacemaker.  Continue aspirin and Plavix. 11.  CKD stage IIIb.  Creatinine baseline 1.56.             Normalizing back to baseline  12.  Chronic diastolic congestive heart failure.  Monitor for any signs of fluid overload             Daily  weights 13.  OSA.  CPAP. 14.  Hyperlipidemia.  Lipitor 15.  Chronic normocytic anemia.  Continue iron supplement             hgb 10 16.  Post stroke dysphagia: Dysphagia #2 thin liquids.  Follow-up speech therapy.  Advance as tolerated    3/2: see diet/nutrition.  17.  UTI/Citrobacter/EColi: S to . Keflex finish 7d course   -low grade temp yesterday  -change keflex to rocephin1g daily for 2 days  3/1- completed today- wil recheck CBC tomorrow to make sure not increasing after finishing ABX. 3/2: continuing to increase. Will trend tomorrow.  18. Hypernatremia  3/1- needs more fluids- per IM, ordered D5water for now 100cc/hour- will reassess after Na has improved 19. Hypercalcemia  20.  Severe Dysphagia, NPO- still has problems handling secretions , will discuss with SLP, may need PEG, family would need to make decision     Disposition: Appreciate Dorien Chihuahua RN, SW discussed patient care conference with family yesterday and family expressed understanding.      LOS: 13 days A FACE TO FACE EVALUATION WAS PERFORMED  [name] 12/27/2019, 8:35 AM

## 2019-12-27 NOTE — Progress Notes (Signed)
Physical Therapy Session Note  Patient Details  Name: [name] MRN: [id number] Date of Birth: September 13, 1928  Today's Date: 12/27/2019 PT Individual Time: 5945-8592 PT Individual Time Calculation (min): 70 min   Short Term Goals: Week 2:  PT Short Term Goal 1 (Week 2): Pt will maintain sitting balance EOB with min assist consistently up to 3 minutes. PT Short Term Goal 2 (Week 2): Pt will transfer to [facility] with max assist of 1 person. PT Short Term Goal 3 (Week 2): Pt will perform bed mobility with max assis tof 1 consistently  Skilled Therapeutic Interventions/Progress Updates: Pt presented in TIS agreeable to therapy. Pt noted to be incontinent of B&B. Performed Stedy transfer to bed maxA x 2 and required maxA x 1 sit to supine. Pt performed rolling modA x 1 to R, maxA x 1 to L to allow for peri-care and changing of brief. Session then focused on bed level activities due to +2 not avail for pt safety. PTA performed PROM of RLE hip flexion/extension x 5 and hip er/ir x 5. Pt initially c/o pain at hip however decreased with repetitions. Pt then performed ankle pumps, hip abd/add, heel slides, SLR, SAQ, and adductor squeeze x 10 with LLE. Performed modified bridge with bolster behind knees x10 with pt able to activate glutes and initiate elevation however not able to clear hips off bed. Pt also performed head lifts with chin tuck for abdominals/core activation. PTA assisted in LTR to R for oblique activation and for facilitation of bed mobility. Pt participated in rolling to R x 3 initially requiring modA then fading to minA near [place] with increased time and cues with use of bed rail. Pt noted to begin falling asleep between activities. As pt's daughter arrived, performed dependent transfer to [facility] with use of draw sheet. HOB elevated and pt was able to perform modified sit up without use of RUE to lift shoulders off of bed to allow placement of pillow. Pt then positioned for comfort and left with bed  alarm on, call bell within reach and needs met.        Therapy Documentation Precautions:  Precautions Precautions: Fall Restrictions Weight Bearing Restrictions: No    Therapy/Group: Individual Therapy  Jmya Uliano  Coralyn Roselli, PTA  12/27/2019, 4:28 PM

## 2019-12-27 NOTE — Progress Notes (Signed)
Occupational Therapy Session Note  Patient Details  Name: CALEAH TORTORELLI MRN: [id number] Date of Birth: 1928-04-10  Today's Date: 12/27/2019 OT Individual Time: 1655-3748 OT Individual Time Calculation (min): 70 min    Short Term Goals: Week 2:  OT Short Term Goal 1 (Week 2): Pt will complete 1 grooming task at the sink with mod cues for sequencing OT Short Term Goal 2 (Week 2): Pt will complete sit<stand in [place] with 1 assist during toileting or dressing tasks OT Short Term Goal 3 (Week 2): Pt will complete 1 self care task EOB with Mod A for sitting balance  Skilled Therapeutic Interventions/Progress Updates:    Upon entering the room, pt supine in bed with eyes closing periodically and lethargic throughout session. OT providing oral care with use of suction toothbrush for safety. OT treading pants onto pt's feet with total A and pt needing total A to roll L <> R but needing second perform to assist with clothing management. OT then noted pt to be incontinent of bowel and pt unaware. Pt needing total A +2 for hygiene and clothing management. Total A supine >sit onto EOB with max A for static sitting balance. Max A +2 for sit <>stand for transfer into stedy and transferred into tilt in space wheelchair. OT placed lap tray over pt for safety. OT placing lotion onto R UE and pt able to locate R UE and rub lotion into arm with L UE. Pt becoming very fatigued at this point and chair alarm belt donned and soft call bell placed where pt able to locate. Pt tilted back for safety and she was sound asleep as therapist exited the room.   Therapy Documentation Precautions:  Precautions Precautions: Fall Restrictions Weight Bearing Restrictions: No Pain: Pain Assessment Pain Scale: 0-10 Pain Score: 0-No pain   Therapy/Group: Individual Therapy  Gypsy Decant 12/27/2019, 10:15 AM

## 2019-12-27 NOTE — Progress Notes (Addendum)
Speech Language Pathology Daily Session Note  Patient Details  Name: [name] MRN: [id number] Date of Birth: 1927/12/22  Today's Date: 12/27/2019 SLP Individual Time: 1100-1159 SLP Individual Time Calculation (min): 59 min  Short Term Goals: Week 2: SLP Short Term Goal 1 (Week 2): Pt will consume trials of ice chips, demonstrating inititaion of swallow sequence in 1/5 opportunities with Max A multimodal cues. SLP Short Term Goal 2 (Week 2): Pt will sustain attention to functional familiar tasks for 2 minute intervals with Max A multimodal cues. SLP Short Term Goal 3 (Week 2): Pt will demonstrate ability to problem solve during basic functional tasks with Max A verbal/visual cues. SLP Short Term Goal 4 (Week 2): Pt will demonstrate verbal and physical initiation in 50% opportunities with Max A multimodal cues. SLP Short Term Goal 5 (Week 2): Pt orient to place and time with Min A verbal/visual cues for use of aids. SLP Short Term Goal 6 (Week 2): Pt will respond to Mod A multimodal cues for use of increased vocal intensity to increase speech intelligibility at the word or short phrase level.  Skilled Therapeutic Interventions: Pt was seen for skilled ST targeting dysphagia and cognitive goals. Of note, pt was fully alert and better able to fully participate and initiate during all tasks today. Overall Max A verbal and visual cues as well as assistance with oral suctioning required for secretion management. Wet vocal quality noted at baseline and following any PO trials today. Pt able to participate in oral care more effectively today, with extra time and Mod-Mod A for thoroughness. Pt demonstrated weak lingual manipulation and much mastication with overall limited ROM of mandible. It was unclear whether pt truly initiated swallow sequence, however strong cough response was noted. Recommend continue NPO. Pt oriented to self, place, and month with no more than Supervision A questions cues.  Binary options required for orientation to situation. Pt matched colored blocks to a 9-color pattern board with Max faded to Mod A verbal and visual cues for problem solving and error awareness. She sustained attention for 15 minute intervals with no more than Min A verbal cues for redirection. Pt left sitting in tilt in space wheelchair with alarm set and needs within reach. Continue per current plan of care.     Pain Pain Assessment Pain Scale: 0-10 Pain Score: 0-No pain  Therapy/Group: Individual Therapy  Arbutus Leas 12/27/2019, 12:05 PM

## 2019-12-28 ENCOUNTER — Inpatient Hospital Stay (HOSPITAL_COMMUNITY): Payer: Medicare Other

## 2019-12-28 ENCOUNTER — Inpatient Hospital Stay (HOSPITAL_COMMUNITY): Payer: Medicare Other | Admitting: Occupational Therapy

## 2019-12-28 ENCOUNTER — Inpatient Hospital Stay (HOSPITAL_COMMUNITY): Payer: Medicare Other | Admitting: Speech Pathology

## 2019-12-28 ENCOUNTER — Inpatient Hospital Stay (HOSPITAL_COMMUNITY): Payer: Medicare Other | Admitting: Physical Therapy

## 2019-12-28 ENCOUNTER — Inpatient Hospital Stay (HOSPITAL_COMMUNITY): Payer: Medicare Other | Admitting: *Deleted

## 2019-12-28 LAB — GLUCOSE, CAPILLARY
Glucose-Capillary: 173 mg/dL — ABNORMAL HIGH (ref 70–99)
Glucose-Capillary: 208 mg/dL — ABNORMAL HIGH (ref 70–99)
Glucose-Capillary: 132 mg/dL — ABNORMAL HIGH (ref 70–99)
Glucose-Capillary: 103 mg/dL — ABNORMAL HIGH (ref 70–99)
Glucose-Capillary: 110 mg/dL — ABNORMAL HIGH (ref 70–99)

## 2019-12-28 LAB — PTH-RELATED PEPTIDE: PTH-related peptide: <2 pmol/L

## 2019-12-28 NOTE — Progress Notes (Signed)
Physical Therapy Session Note  Patient Details  Name: [name] MRN: [id number] Date of Birth: Dec 03, 1927  Today's Date: 12/28/2019 PT Individual Time: 1115-1155 PT Individual Time Calculation (min): 40 min   Short Term Goals: Week 2:  PT Short Term Goal 1 (Week 2): Pt will maintain sitting balance EOB with min assist consistently up to 3 minutes. PT Short Term Goal 2 (Week 2): Pt will transfer to [facility] with max assist of 1 person. PT Short Term Goal 3 (Week 2): Pt will perform bed mobility with max assis tof 1 consistently  Skilled Therapeutic Interventions/Progress Updates:   Pt received supine in bed and agreeable to PT. Rolling to the L with max assist. Pt noted to have severe RLE pain with facial grimace. Pt noted to reach towards hip in response to pain. RLE ROM into flexion/extension without  Pain, but reports pain with hip adduction and IR, at hip as well as mild discomfort with ER. RN notified.   Supine>sit transfer with maxA +2 to prevent RLE pain and improve safety. Sitting balance with min assist and moderate cues for awareness of R lateral lean. Sit<>stand in stedy with max A+2 for safety. Transferred to day room kinetron reciprocal movement training 5 x 2 min with max assist to initiate movement on first bout progressing to min assist on last bout.   Pt returned to room and performed stedy transfer to bed with mod A+2. Sit>supine completed with max assist + 2 to prevent pain in the RLE. Left supine in bed with call bell in reach and all needs met.          Therapy Documentation Precautions:  Precautions Precautions: Fall Restrictions Weight Bearing Restrictions: No    Vital Signs: Therapy Vitals Temp: 98 F (36.7 C) Temp Source: Oral Pulse Rate: 74 Resp: 18 BP: (!) 139/52 Patient Position (if appropriate): Lying Oxygen Therapy SpO2: 100 % O2 Device: Room Air Pain: Denies  Therapy/Group: Individual Therapy  Lorie Phenix 12/28/2019, 4:59 PM

## 2019-12-28 NOTE — Progress Notes (Signed)
[place] PHYSICAL MEDICINE & REHABILITATION PROGRESS NOTE   Subjective/Complaints: Alert , participates with PT but max a standing in parallel bar, no WB RLE ROS: pt nonverbal this AM- couldn't get ROS Objective:   No results found. Recent Labs    12/26/19 0518  WBC 10.3  HGB 9.3*  HCT 30.4*  PLT 190   Recent Labs    12/26/19 0518  NA 139  K 3.8  CL 103  CO2 30  GLUCOSE 238*  BUN 23  CREATININE 1.21*  CALCIUM 9.9    Intake/Output Summary (Last 24 hours) at 12/28/2019 0851 Last data filed at 12/28/2019 0215 Gross per 24 hour  Intake 10 ml  Output 1300 ml  Net -1290 ml     Physical Exam: Vital Signs Blood pressure (!) 124/47, pulse 78, temperature 98.2 F (36.8 C), resp. rate 18, height 5' 7"  (1.702 m), weight 79.5 kg, SpO2 95 %.   Constitutional: No distress . Vital signs reviewed. Lying in bed, sleeping soundly   General: No acute distress Mood and affect are appropriate Heart: Regular rate and rhythm no rubs murmurs or extra sounds Lungs: Clear to auscultation, breathing unlabored, no rales or wheezes Abdomen: Positive bowel sounds, soft nontender to palpation, nondistended Extremities: No clubbing, cyanosis, or edema Skin: No evidence of breakdown, no evidence of rash Neurologic: Cranial nerves II through XII intact, motor strength is 1/5 in right , 4/5 Left  deltoid, bicep, tricep, grip, hip flexor, knee extensors, ankle dorsiflexor and plantar flexor  Musculoskeletal:  No joint swelling or hand deformities     Assessment/Plan: 1. Functional deficits secondary to Left brainstem vs subcortical infarct which require 3+ hours per day of interdisciplinary therapy in a comprehensive inpatient rehab setting.  Physiatrist is providing close team supervision and 24 hour management of active medical problems listed below.  Physiatrist and rehab team continue to assess barriers to discharge/monitor patient progress toward functional and medical goals  Care  Tool:  Bathing  Bathing activity did not occur: Safety/medical concerns Body parts bathed by patient: Face   Body parts bathed by helper: Right arm, Left arm, Chest, Abdomen, Front perineal area, Buttocks, Right upper leg, Left upper leg, Right lower leg, Left lower leg     Bathing assist Assist Level: 2 Helpers     Upper Body Dressing/Undressing Upper body dressing   What is the patient wearing?: Hospital gown only    Upper body assist Assist Level: Total Assistance - Patient < 25%    Lower Body Dressing/Undressing Lower body dressing      What is the patient wearing?: Incontinence brief     Lower body assist Assist for lower body dressing: 2 Helpers     Toileting Toileting Toileting Activity did not occur (Clothing management and hygiene only): N/A (no void or bm)  Toileting assist Assist for toileting: Dependent - Patient 0%(3 helpers)     Transfers Chair/bed transfer  Transfers assist  Chair/bed transfer activity did not occur: Safety/medical concerns  Chair/bed transfer assist level: Dependent - mechanical lift     Locomotion Ambulation   Ambulation assist   Ambulation activity did not occur: Safety/medical concerns          Walk 10 feet activity   Assist  Walk 10 feet activity did not occur: Safety/medical concerns        Walk 50 feet activity   Assist Walk 50 feet with 2 turns activity did not occur: Safety/medical concerns         Walk  150 feet activity   Assist Walk 150 feet activity did not occur: Safety/medical concerns         Walk 10 feet on uneven surface  activity   Assist Walk 10 feet on uneven surfaces activity did not occur: Safety/medical concerns         Wheelchair     Assist Will patient use wheelchair at discharge?: Yes Type of Wheelchair: Manual Wheelchair activity did not occur: Safety/medical concerns         Wheelchair 50 feet with 2 turns activity    Assist    Wheelchair 50 feet  with 2 turns activity did not occur: Safety/medical concerns       Wheelchair 150 feet activity     Assist  Wheelchair 150 feet activity did not occur: Safety/medical concerns       Blood pressure (!) 124/47, pulse 78, temperature 98.2 F (36.8 C), resp. rate 18, height 5' 7"  (1.702 m), weight 79.5 kg, SpO2 95 %.    Medical Problem List and Plan: 1.  Rightt side hemiplegia, dysarthria, +/- aphasia secondary to left brain infarction.  MRI not completed due to pacemaker  -CT demonstrates hypoattenuation in left pons which is consistent with current presentation---increased right hp and increased difficulty with oro-pharyngeal control             -patient may shower             -ELOS/Goals: 25-30 days/min/mod a             Continue PT, OT, SLP  Team conference today please see physician documentation under team conference tab, met with team  to discuss problems,progress, and goals. Formulized individual treatment plan based on medical history, underlying problem and comorbidities.  2.  Antithrombotics: -DVT/anticoagulation: Subcutaneous Lovenox             -antiplatelet therapy: Continue Aspirin 81 mg daily, Plavix 75 mg daily 3. Pain Management: ContinueTylenol as needed, pain is well controlled.  4. Mood with memory deficits.  Aricept 10 mg nightly             -antipsychotic agents: N/A 5. Neuropsych: This patient is NOT capable of making decisions on her own behalf. 6. Skin/Wound Care: Routine skin checks 7. Fluids/Electrolytes/Nutrition: Routine in and outs.  CMP ordered for tomorrow a.m. 8.  Hypertension.  Norvasc 2.5 mg daily, Coreg 3.125 mg twice daily.               Monitor with increased mobility Vitals:   12/27/19 1953 12/28/19 0406  BP: (!) 125/51 (!) 124/47  Pulse: 73 78  Resp: 18 18  Temp: 98 F (36.7 C) 98.2 F (36.8 C)  SpO2: 98% 95%   Controlled this am, cont above medications   9.  Diabetes mellitus with hyperglycemia.  Hemoglobin A1c 8.2.  Lantus insulin  10 units twice daily, NovoLog 2 units 3 times daily check blood sugars before meals and at bedtime              CBG (last 3)  Recent Labs    12/27/19 2357 12/28/19 0403 12/28/19 0736  GLUCAP 144* 173* 208*  Increase to 19U BID lantus 10.  CAD with stenting as well as pacemaker.  Continue aspirin and Plavix. 11.  CKD stage IIIb.  Creatinine baseline 1.56.             Normalizing back to baseline  12.  Chronic diastolic congestive heart failure.  Monitor for any signs of fluid overload  Daily weights 13.  OSA.  CPAP. 14.  Hyperlipidemia.  Lipitor 15.  Chronic normocytic anemia.  Continue iron supplement             hgb 10 16.  Post stroke dysphagia: Dysphagia #2 thin liquids.  Follow-up speech therapy.  Advance as tolerated    3/2: see diet/nutrition.  17.  UTI/Citrobacter/EColi:     19. Hypercalcemia  20.  Severe Dysphagia, NPO- still has problems handling secretions , will discuss with SLP, may need PEG, family would need to make decision     Disposition: Appreciate Dorien Chihuahua RN, SW discussed patient care conference with family yesterday and family expressed understanding.      LOS: 14 days A FACE TO FACE EVALUATION WAS PERFORMED  Charlett Blake 12/28/2019, 8:51 AM
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
## 2019-12-28 NOTE — Progress Notes (Signed)
Physical Therapy Session Note  Patient Details  Name: BREYANA FOLLANSBEE MRN: [id number] Date of Birth: 01-10-1928  Today's Date: 12/28/2019 PT Individual Time: 0805-0850 PT Individual Time Calculation (min): 45 min   Short Term Goals: Week 2:  PT Short Term Goal 1 (Week 2): Pt will maintain sitting balance EOB with min assist consistently up to 3 minutes. PT Short Term Goal 2 (Week 2): Pt will transfer to [facility] with max assist of 1 person. PT Short Term Goal 3 (Week 2): Pt will perform bed mobility with max assis tof 1 consistently  Skilled Therapeutic Interventions/Progress: Pt presented in bed sleeping but easily aroused. Pt noted to be incontinent of B&B. Pt was able to perform rolling to R with minA, increased time and verbal cues. Peri-care performed with total A. Pt c/o RLE pain at hip. PTA performed hip flexion/ext with gentle ER which pt indicated did help with pain. Performed supine to sit maxA x 1 with use of bed features. Pt transferred to TIS with use of Stedy maxA x 2. Pt transferred to parallel bars and performed STS in parallel bars maxA for two bouts. Pt was able to maintain standing for approx 10 sec before noted fatigue visible. On second stand PTA noted odor of possible BM. Pt returned to TIS and transported back to room. Due to time constraints PTA notified NT and pt left in TIS with belt alarm on and soft touch bell within reach.      Therapy Documentation Precautions:  Precautions Precautions: Fall Restrictions Weight Bearing Restrictions: No    Therapy/Group: Individual Therapy  Wilmont Olund  Nike Southwell, PTA  12/28/2019, 12:53 PM

## 2019-12-28 NOTE — Plan of Care (Signed)
  Problem: Consults Goal: RH STROKE PATIENT EDUCATION Description: See Patient Education module for education specifics  Outcome: Progressing Goal: Nutrition Consult-if indicated Outcome: Progressing Goal: Diabetes Guidelines if Diabetic/Glucose > 140 Description: If diabetic or lab glucose is > 140 mg/dl - Initiate Diabetes/Hyperglycemia Guidelines & Document Interventions  Outcome: Progressing   Problem: RH BOWEL ELIMINATION Goal: RH STG MANAGE BOWEL WITH ASSISTANCE Description: STG Manage Bowel with Moderate Assistance. Outcome: Progressing   Problem: RH BLADDER ELIMINATION Goal: RH STG MANAGE BLADDER WITH ASSISTANCE Description: STG Manage Bladder With Moderate Assistance Outcome: Progressing   Problem: RH SKIN INTEGRITY Goal: RH STG SKIN FREE OF INFECTION/BREAKDOWN Description: Patient will verbalize how to prevent skin breakdown during hospitalization. Outcome: Progressing Goal: RH STG MAINTAIN SKIN INTEGRITY WITH ASSISTANCE Description: STG Maintain Skin Integrity With Moderate Assistance. Outcome: Progressing   Problem: RH SAFETY Goal: RH STG ADHERE TO SAFETY PRECAUTIONS W/ASSISTANCE/DEVICE Description: STG Adhere to Safety Precautions With Moderate Assistance/Device. Outcome: Progressing   Problem: RH COGNITION-NURSING Goal: RH STG USES MEMORY AIDS/STRATEGIES W/ASSIST TO PROBLEM SOLVE Description: STG Uses Memory Aids/Strategies With Moderate Assistance to Problem Solve. Outcome: Progressing   Problem: RH PAIN MANAGEMENT Goal: RH STG PAIN MANAGED AT OR BELOW PT'S PAIN GOAL Description: <5 Outcome: Progressing   Problem: RH KNOWLEDGE DEFICIT Goal: RH STG INCREASE KNOWLEDGE OF DIABETES Outcome: Progressing Goal: RH STG INCREASE KNOWLEDGE OF HYPERTENSION Outcome: Progressing Goal: RH STG INCREASE KNOWLEDGE OF DYSPHAGIA/FLUID INTAKE Outcome: Progressing

## 2019-12-28 NOTE — Progress Notes (Signed)
Speech Language Pathology Daily Session Note  Patient Details  Name: [name] MRN: [id number] Date of Birth: February 05, 1928  Today's Date: 12/28/2019 SLP Individual Time: 1300-1335 SLP Individual Time Calculation (min): 35 min  Short Term Goals: Week 2: SLP Short Term Goal 1 (Week 2): Pt will consume trials of ice chips, demonstrating inititaion of swallow sequence in 1/5 opportunities with Max A multimodal cues. SLP Short Term Goal 2 (Week 2): Pt will sustain attention to functional familiar tasks for 2 minute intervals with Max A multimodal cues. SLP Short Term Goal 3 (Week 2): Pt will demonstrate ability to problem solve during basic functional tasks with Max A verbal/visual cues. SLP Short Term Goal 4 (Week 2): Pt will demonstrate verbal and physical initiation in 50% opportunities with Max A multimodal cues. SLP Short Term Goal 5 (Week 2): Pt orient to place and time with Min A verbal/visual cues for use of aids. SLP Short Term Goal 6 (Week 2): Pt will respond to Mod A multimodal cues for use of increased vocal intensity to increase speech intelligibility at the word or short phrase level.  Skilled Therapeutic Interventions: Pt was seen for skilled ST intervention targeting aforementioned goals. Pt was sleeping upon arrival of SLP, with wet breathing sounds and right anterior leakage of secretions. Pt awakened easily, but required ongoing stimulation to maintain alertness. SLP facilitated session by providing oral care with suction. Max verbal and tactile cues required for closing lips, protruding tongue, and volitional cough/throat clear. Intermittent wet voice quality noted even prior to ice chip presentation. Pt was given ice chip x1. Minimal oral manipulation of ice noted, and suspected delay of swallow reflex. Strong cough response was elicited, however. PT accepted 1/2 tsp boluses of honey thick liquid, again demonstrating poor manipulation and suspected delay in reflex trigger. No  cough elicited following 2 trials of honey thick liquid via 1/2 teaspoon bolus. Oral cavity was suctioned after po trials, with removal of minimal liquid.   Pt required max multimodal cues to achieve lip closure when stating her first name. Pt oriented to self and place with poor intelligibility. Pt continued to fall asleep, so session was terminated. Recommend continuing NPO status  Pt was left in bed with alarm on, all needs within reach, HOB at 45 degrees to minimize aspiration risk. Continue ST per current plan of care.    Pain None indicated  Therapy/Group: Individual Therapy   Vickki Igou B. Quentin Ore, [facility], CCC-SLP Speech Language Pathologist  Shonna Chock 12/28/2019, 1:35 PM

## 2019-12-28 NOTE — Progress Notes (Signed)
Occupational Therapy Session Note  Patient Details  Name: [name] MRN: [id number] Date of Birth: 10/07/1928  Today's Date: 12/28/2019 OT Individual Time: 2353-6144 OT Individual Time Calculation (min): 41 min    Short Term Goals: Week 2:  OT Short Term Goal 1 (Week 2): Pt will complete 1 grooming task at the sink with mod cues for sequencing OT Short Term Goal 2 (Week 2): Pt will complete sit<stand in [place] with 1 assist during toileting or dressing tasks OT Short Term Goal 3 (Week 2): Pt will complete 1 self care task EOB with Mod A for sitting balance  Skilled Therapeutic Interventions/Progress Updates:    Upon entering the room,NT present and obtaining pt's vital signs. Pt is awake but with very flat affect. OT asking pt if she believed she needed her brief changed and she nodded head "yes". Pt has soft call bell in room but is not utilizing to call staff during the day. Pt rolling to the R with use of bed rail and min A but needing total A to roll to the L. Pt required +2 assistance for hygiene secondary to pt having BM on gown, bed linen, and self. Pt repositioned for comfort. Pt initially answering questions and then answers "yes" or "no" but quickly stops interacting with OT this session. Max cuing to turn head to R to visual scan to locate items called out as well as R UE. Pt unable to remain awake and positioned for comfort and to protect the hemiplegic side before exiting the room. Bed alarm activated and soft call bell within reach.   Therapy Documentation Precautions:  Precautions Precautions: Fall Restrictions Weight Bearing Restrictions: No Vital Signs: Therapy Vitals Temp: 98 F (36.7 C) Temp Source: Oral Pulse Rate: 74 Resp: 18 BP: (!) 139/52 Patient Position (if appropriate): Lying Oxygen Therapy SpO2: 100 % O2 Device: Room Air   Therapy/Group: Individual Therapy  Gypsy Decant 12/28/2019, 4:24 PM

## 2019-12-29 ENCOUNTER — Inpatient Hospital Stay (HOSPITAL_COMMUNITY): Payer: Medicare Other | Admitting: Occupational Therapy

## 2019-12-29 ENCOUNTER — Inpatient Hospital Stay (HOSPITAL_COMMUNITY): Payer: Medicare Other | Admitting: Speech Pathology

## 2019-12-29 LAB — GLUCOSE, CAPILLARY
Glucose-Capillary: 120 mg/dL — ABNORMAL HIGH (ref 70–99)
Glucose-Capillary: 123 mg/dL — ABNORMAL HIGH (ref 70–99)
Glucose-Capillary: 169 mg/dL — ABNORMAL HIGH (ref 70–99)
Glucose-Capillary: 178 mg/dL — ABNORMAL HIGH (ref 70–99)
Glucose-Capillary: 186 mg/dL — ABNORMAL HIGH (ref 70–99)
Glucose-Capillary: 221 mg/dL — ABNORMAL HIGH (ref 70–99)
Glucose-Capillary: 231 mg/dL — ABNORMAL HIGH (ref 70–99)

## 2019-12-29 MED ORDER — INSULIN GLARGINE 100 UNIT/ML ~~LOC~~ SOLN
19.0000 [IU] | Freq: Two times a day (BID) | SUBCUTANEOUS | Status: DC
  Administered 2019-12-29 – 2020-01-10 (×22): 19 [IU] via SUBCUTANEOUS
  Filled 2019-12-29 (×25): qty 0.19

## 2019-12-29 NOTE — Progress Notes (Signed)
Occupational Therapy Session Note  Patient Details  Name: [name] MRN: [id number] Date of Birth: 19-Jun-1928  Today's Date: 12/29/2019 OT Individual Time: 1300-1400 OT Individual Time Calculation (min): 60 min    Short Term Goals: Week 1:  OT Short Term Goal 1 (Week 1): Pt will maintain dynamic balance during self care tasks at mod A or less for 5 minutes. OT Short Term Goal 1 - Progress (Week 1): Not met OT Short Term Goal 2 (Week 1): Pt will perform UB dressing with mod A. OT Short Term Goal 2 - Progress (Week 1): Not met OT Short Term Goal 3 (Week 1): Pt will perform functional transfer with assist of 1 person safely. OT Short Term Goal 3 - Progress (Week 1): Not met Week 2:  OT Short Term Goal 1 (Week 2): Pt will complete 1 grooming task at the sink with mod cues for sequencing OT Short Term Goal 2 (Week 2): Pt will complete sit<stand in [place] with 1 assist during toileting or dressing tasks OT Short Term Goal 3 (Week 2): Pt will complete 1 self care task EOB with Mod A for sitting balance  Skilled Therapeutic Interventions/Progress Updates:    Pt easily aroused from nap. PT came to EOb with max A with initiation for forward weight shift and to bring left hip forward. PT able to maintain sitting balance with supervision with left Ue support. Pt donned shirt in sitting with max A with initiation to thread Ues. Pt then became incontinent  Of liquid bowel. Returned to supine with total A. Pt able to roll to the right with mod A and max A to the left with A with positioning of LEs. Total A for hygiene. Pt performed rolling multiples times mindful of previous right hip discomfort. Oral care performed with total A. Kineosotape applied to right forearm and fingers for edema. By this time pt was asleep - Missed 15 min of session.   Therapy Documentation Precautions:  Precautions Precautions: Fall Restrictions Weight Bearing Restrictions: No General: General OT Amount of Missed  Time: 15 Minutes Vital Signs: Therapy Vitals Temp: 98.1 F (36.7 C) Pulse Rate: 69 Resp: 16 BP: (!) 141/52 Patient Position (if appropriate): Lying Oxygen Therapy SpO2: 100 % O2 Device: Room Air Pain: Pain Assessment Pain Scale: 0-10 Pain Score: 0-No pain   Therapy/Group: Individual Therapy  Willeen Cass [facility] 12/29/2019, 3:26 PM

## 2019-12-29 NOTE — Progress Notes (Signed)
Orthopedic Tech Progress Note Patient Details:  [name] 01-19-1928 [id number]  Patient ID: [name], female   DOB: 1928-03-09, 86 y.o.   MRN: [id number]   [name] 12/29/2019, 3:00 PMROUTED BRACE ORDERS TO HANGER.

## 2019-12-29 NOTE — H&P (View-Only) (Signed)
[facility] Gastroenterology Consult  Referring Provider: Dr. Alysia Penna Primary Care Physician:  [name], MD Primary Gastroenterologist: [name]  Reason for Consultation: Proctitis on CT  HPI: [name] is a 86 y.o. female with past medical history of HTN, heart block (s/p pacemaker), DM type II, diastolic CHF, CKD IIIb, OSA, CAD (s/p PCI), celiac disease, and recent CVA (hospitalized 2/16 to 12/14/2019) with hemiparesis and dysphagia and currently on 40m  Plavix, 81 mg ASA.  She is also on 30 mg Lovenox qd for DVT prophylaxis.  CT today pertinent for evidence for extensive wall thickening involving the rectum. Concern for focal fluid or edema within the left side of the rectal wall. There is also extensive edema in the perirectal region and presacral region.No evidence for a bowel obstruction.  History was difficult to obtain, as patient was very sleepy and unable to stay awake.  She would briefly awaken to voice but then quickly fall back to sleep.  She responded "no" to the following: Abdominal pain, nausea, vomiting, rectal bleeding, unexplained weight loss, heartburn, decreased appetite.  Patient unable to report any additional information.  Asked patient if she has any family history of colon cancer when her last colonoscopy was, but she did not respond.  Per RN, [name] had some diarrhea this morning, but she noted the patient is on a bowel regimen (MiraLAX, Senna).  She did not note any blood in the stool, the patient denied stomach pain with bowel movements when asked.  Current RN unaware of patient's normal number of bowel movements during this hospitalization or when current diarrhea started.  Of note, patient was recently treated with antibiotics (meropenem> Keflex> ceftriaxone) for UTI (E. Coli/Citrobacter).  Per chart review, appears patient is oriented only to self, often nonverbal, and NOT capable of making decisions on her own behalf.  Patient has an NG tube in place  due to dysphagia secondary to CVA.  Additionally, patient has had stool incontinence during this hospitalization.  Spoke with patient's daughter [name](designated party).  She notes that the patient had recent onset diarrhea prior to hospitalization, which they believed may have been related to a medication.  She denied seeing any bloody or black stools.  She also stated the patient had not complained of any abdominal pain, nausea, vomiting, or other GI symptoms.  She stated the patient had a colonoscopy in 2013 but she is unsure of the findings.  As per patient's daughter, there is no family history of colon cancer.   Last colonoscopy on file was 09/21/2004, and findings included very mild pandiverticulosis as well as multiple sessile polyps (bx: one adenomatous polyp, one hyperplastic polyp; no malignancy or high-grade dysplasia was identified). It was noted that there was no evidence of any masses, polyps, ulcerations, erosions, inflammation or vascular abnormalities in the cecum, ascending, transverse, descending or sigmoid colon.    Past Medical History:  Diagnosis Date  . Anemia   . Arthritis   . Celiac disease   . Chronic systolic CHF (congestive heart failure) ([place]   . Complete heart block ([place]   . Coronary artery disease   . Diabetes mellitus    INSULIN DEPENDENT  . GERD (gastroesophageal reflux disease)   . Glaucoma   . Headache(784.0)   . Heart disease   . Hypercholesterolemia   . Hypertension   . Iron deficiency anemia, unspecified   . Ischemic cardiomyopathy   . Memory difficulties 04/14/2019  . Shortness of breath   . Sleep apnea  uses cpap  . Stroke ([place])   . Unspecified constipation     Past Surgical History:  Procedure Laterality Date  . BACK SURGERY    . BI-VENTRICULAR PACEMAKER INSERTION (CRT-P)  March 2014   [facility]  . BIV PACEMAKER GENERATOR CHANGEOUT N/A 06/29/2019   Procedure: BIV PACEMAKER GENERATOR CHANGEOUT;  Surgeon: Evans Lance,  MD;  Location: [facility];  Service: Cardiovascular;  Laterality: N/A;  . CARDIAC CATHETERIZATION  09/02/2012  . CARDIAC SURGERY    . CORONARY ANGIOPLASTY WITH STENT PLACEMENT  09/02/2012   RCA  . PERCUTANEOUS CORONARY STENT INTERVENTION (PCI-S) N/A 09/02/2012   Procedure: PERCUTANEOUS CORONARY STENT INTERVENTION (PCI-S);  Surgeon: Clent Demark, MD;  Location: [facility] CATH LAB;  Service: Cardiovascular;  Laterality: N/A;  . PERMANENT PACEMAKER INSERTION N/A 11/29/2012   Procedure: PERMANENT PACEMAKER INSERTION;  Surgeon: Deboraha Sprang, MD;  Location: [facility] C[facility] LAB;  Service: Cardiovascular;  Laterality: N/A;  . TEMPORARY PACEMAKER INSERTION N/A 11/28/2012   Procedure: TEMPORARY PACEMAKER INSERTION;  Surgeon: Clent Demark, MD;  Location: [facility] CATH LAB;  Service: Cardiovascular;  Laterality: N/A;    Prior to Admission medications   Medication Sig Start Date End Date Taking? Authorizing Provider  amLODipine (NORVASC) 2.5 MG tablet Take 2.5 mg by mouth every morning.    [provider]  aspirin EC 81 MG EC tablet Take 1 tablet (81 mg total) by mouth daily. 12/15/19   Charlynne Cousins, MD  atorvastatin (LIPITOR) 80 MG tablet Take 80 mg by mouth every morning.  04/17/15   [provider]  brimonidine-timolol (COMBIGAN) 0.2-0.5 % ophthalmic solution Place 1 drop into both eyes 2 (two) times daily.    [provider]  calcitRIOL (ROCALTROL) 0.25 MCG capsule Take 0.25 mcg by mouth every morning.    [provider]  carvedilol (COREG) 3.125 MG tablet Take 3.125 mg by mouth 2 (two) times daily. 04/17/15   [provider]  clopidogrel (PLAVIX) 75 MG tablet Take 1 tablet (75 mg total) by mouth daily. 07/03/19   Thurnell Lose, MD  Cod Liver Oil CAPS Take 1 capsule by mouth at bedtime.     [provider]  donepezil (ARICEPT) 10 MG tablet Take 10 mg by mouth at bedtime.  11/07/19   [provider]  dorzolamide (TRUSOPT) 2 % ophthalmic  solution Place 1 drop into both eyes 2 (two) times daily.  07/28/13   [provider]  ferrous sulfate 325 (65 FE) MG tablet Take 325 mg by mouth daily with breakfast.    [provider]  insulin degludec (TRESIBA FLEXTOUCH) 100 UNIT/ML SOPN FlexTouch Pen Inject 10-20 Units into the skin See admin instructions. Inject 10-20 units into the skin at bedtime, based upon CGB (home capillary blood glucose)    [provider]  Insulin Lispro (HUMALOG KWIKPEN) 200 UNIT/ML SOPN Inject 6 Units into the skin 3 (three) times daily as needed (high blood sugar).     [provider]  latanoprost (XALATAN) 0.005 % ophthalmic solution Appointment OVERDUE, place 1 drop into both eyes daily at bedtime Patient taking differently: Place 1 drop into both eyes at bedtime.  03/02/14   Blanchie Serve, MD  linagliptin (TRADJENTA) 5 MG TABS tablet Take 5 mg by mouth every morning.    [provider]  loperamide (IMODIUM) 2 MG capsule Take 2 mg by mouth as needed for diarrhea or loose stools.    [provider]  Multiple Vitamin (MULTIVITAMIN WITH MINERALS)  TABS Take 1 tablet by mouth at bedtime.     [provider]  Multiple Vitamins-Minerals (EYE VITAMINS) CAPS Take 1 capsule by mouth every morning.    [provider]  nitroGLYCERIN (NITROSTAT) 0.4 MG SL tablet Place 0.4 mg under the tongue every 5 (five) minutes as needed for chest pain.  09/03/12   Charolette Forward, MD  NOVOFINE 32G X 6 MM MISC  01/25/13   [provider]  pantoprazole (PROTONIX) 40 MG tablet Take 40 mg by mouth daily before breakfast.  05/07/15   [provider]  valsartan-hydrochlorothiazide (DIOVAN-HCT) 320-12.5 MG tablet Take 1 tablet by mouth every morning.     [provider]    Current Facility-Administered Medications  Medication Dose Route Frequency Provider Last Rate Last Admin  . 0.45 % sodium chloride infusion   Intravenous Continuous Dwana Melena, MD  75 mL/hr at 12/22/19 1312 New Bag at 12/22/19 1312  . acetaminophen (TYLENOL) tablet 650 mg  650 mg Oral Q4H PRN Angiulli, Lavon Paganini, PA-C       Or  . acetaminophen (TYLENOL) 160 MG/5ML solution 650 mg  650 mg Per Tube Q4H PRN Cathlyn Parsons, PA-C   650 mg at 12/22/19 1659   Or  . acetaminophen (TYLENOL) suppository 650 mg  650 mg Rectal Q4H PRN Angiulli, Lavon Paganini, PA-C      . amLODipine (NORVASC) tablet 2.5 mg  2.5 mg Per Tube Daily Kirsteins, Luanna Salk, MD   2.5 mg at 12/29/19 0852  . aspirin chewable tablet 81 mg  81 mg Per J Tube Daily Joselyn Glassman A, RPH   81 mg at 12/29/19 5400  . atorvastatin (LIPITOR) tablet 80 mg  80 mg Per Tube q morning - 10a Joselyn Glassman A, RPH   80 mg at 12/28/19 1801  . brimonidine (ALPHAGAN) 0.2 % ophthalmic solution 1 drop  1 drop Both Eyes BID Cathlyn Parsons, PA-C   1 drop at 12/29/19 8676  . carvedilol (COREG) tablet 3.125 mg  3.125 mg Per Tube BID Joselyn Glassman A, RPH   3.125 mg at 12/29/19 1950  . chlorhexidine (PERIDEX) 0.12 % solution 15 mL  15 mL Mouth Rinse BID Charlett Blake, MD   15 mL at 12/29/19 0820  . cinacalcet (SENSIPAR) tablet 30 mg  30 mg Oral BID WC Dwana Melena, MD   30 mg at 12/29/19 9326  . donepezil (ARICEPT) tablet 10 mg  10 mg Per Tube QHS Pierce, Dwayne A, RPH   10 mg at 12/28/19 2014  . dorzolamide (TRUSOPT) 2 % ophthalmic solution 1 drop  1 drop Both Eyes TID Cathlyn Parsons, PA-C   1 drop at 12/29/19 7124  . enoxaparin (LOVENOX) injection 30 mg  30 mg Subcutaneous Daily Cathlyn Parsons, PA-C   30 mg at 12/29/19 0820  . feeding supplement (JEVITY 1.2 CAL) liquid 1,000 mL  1,000 mL Per Tube Continuous Charlett Blake, MD 60 mL/hr at 12/27/19 2318 1,000 mL at 12/27/19 2318  . feeding supplement (PRO-STAT SUGAR FREE 64) liquid 30 mL  30 mL Per Tube Daily Kirsteins, Luanna Salk, MD   30 mL at 12/29/19 5809  . ferrous sulfate 300 (60 Fe) MG/5ML syrup 300 mg  300 mg Oral Q breakfast Joselyn Glassman A, RPH   300 mg at  12/29/19 9833  . free water 140 mL  140 mL Per Tube Q6H Kirsteins, Luanna Salk, MD   140 mL at 12/29/19 0619  . insulin aspart (  novoLOG) injection 0-15 Units  0-15 Units Subcutaneous Q4H Mercy Riding, MD   5 Units at 12/29/19 0851  . insulin aspart (novoLOG) injection 6 Units  6 Units Subcutaneous Q4H Mercy Riding, MD   6 Units at 12/29/19 0851  . insulin glargine (LANTUS) injection 19 Units  19 Units Subcutaneous BID Kirsteins, Luanna Salk, MD      . ipratropium-albuterol (DUONEB) 0.5-2.5 (3) MG/3ML nebulizer solution 3 mL  3 mL Nebulization Q6H PRN Angiulli, Lavon Paganini, PA-C      . latanoprost (XALATAN) 0.005 % ophthalmic solution 1 drop  1 drop Both Eyes QHS Cathlyn Parsons, PA-C   1 drop at 12/28/19 2032  . MEDLINE mouth rinse  15 mL Mouth Rinse q12n4p Kirsteins, Luanna Salk, MD   15 mL at 12/28/19 1802  . ondansetron (ZOFRAN) tablet 4 mg  4 mg Per Tube Q8H PRN Joselyn Glassman A, RPH      . pantoprazole sodium (PROTONIX) 40 mg/20 mL oral suspension 40 mg  40 mg Per Tube QAC breakfast Joselyn Glassman A, RPH   40 mg at 12/29/19 7048  . polyethylene glycol (MIRALAX / GLYCOLAX) packet 17 g  17 g Per Tube BID Joselyn Glassman A, RPH   17 g at 12/29/19 8891  . senna-docusate (Senokot-S) tablet 1 tablet  1 tablet Per Tube BID Joselyn Glassman A, RPH   1 tablet at 12/29/19 0820  . sodium chloride flush (NS) 0.9 % injection 10-40 mL  10-40 mL Intracatheter Q12H Kirsteins, Luanna Salk, MD   10 mL at 12/29/19 0825  . sodium chloride flush (NS) 0.9 % injection 10-40 mL  10-40 mL Intracatheter PRN Kirsteins, Luanna Salk, MD   10 mL at 12/20/19 0359  . sorbitol 70 % solution 30 mL  30 mL Oral Daily PRN Cathlyn Parsons, PA-C        Allergies as of 12/14/2019  . (No Known Allergies)    Family History  Problem Relation Age of Onset  . Diabetes Mother   . Hypertension Mother   . Hyperlipidemia Mother   . Cancer Sister   . Dementia Sister   . Neuropathy Sister     Social History   Socioeconomic History  .  Marital status: Widowed    Spouse name: Not on file  . Number of children: 4  . Years of education: 64  . Highest education level: Not on file  Occupational History  . Occupation: Retired  Tobacco Use  . Smoking status: Former Smoker    Quit date: 12/26/1976    Years since quitting: 43.0  . Smokeless tobacco: Never Used  Substance and Sexual Activity  . Alcohol use: No    Alcohol/week: 0.0 standard drinks  . Drug use: No  . Sexual activity: Never  Other Topics Concern  . Not on file  Social History Narrative   Regular exercise-no   Caffeine Use-yes   Patient lives at home at home with [name] her daughter.   Retired   Right handed   Education 12th   Caffeine one cup of coffee daily   Social Determinants of Radio broadcast assistant Strain:   . Difficulty of Paying Living Expenses:   Food Insecurity:   . Worried About Charity fundraiser in the Last Year:   . Arboriculturist in the Last Year:   Transportation Needs:   . Film/video editor (Medical):   Marland Kitchen Lack of Transportation (Non-Medical):   Physical Activity:   .  Days of Exercise per Week:   . Minutes of Exercise per Session:   Stress:   . Feeling of Stress :   Social Connections:   . Frequency of Communication with Friends and Family:   . Frequency of Social Gatherings with Friends and Family:   . Attends Religious Services:   . Active Member of Clubs or Organizations:   . Attends Archivist Meetings:   Marland Kitchen Marital Status:   Intimate Partner Violence:   . Fear of Current or Ex-Partner:   . Emotionally Abused:   Marland Kitchen Physically Abused:   . Sexually Abused:     Review of Systems: As noted in HPI.  Physical Exam: Vital signs in last 24 hours: Temp:  [97.9 F (36.6 C)-98.8 F (37.1 C)] 97.9 F (36.6 C) (03/11 0416) Pulse Rate:  [68-79] 68 (03/11 0852) Resp:  [18] 18 (03/11 0416) BP: (120-157)/(47-71) 120/48 (03/11 0852) SpO2:  [96 %-100 %] 96 % (03/11 0416) Last BM Date: 12/28/19  General:  Encountered in bed, sleeping, arouses to voice but quickly falls back to sleep Head:  Normocephalic and atraumatic. Eyes:  Sclera clear, no icterus. Mild pallor Ears:  Normal auditory acuity. Nose:  No deformity, discharge, or lesions.  NG tube in place. Lungs:  Clear throughout to auscultation.   No wheezes, crackles, or rhonchi. No acute distress. Heart:  Regular rate and rhythm; soft systolic murmur, no clicks, rubs,  or gallops. Extremities:  Without edema.  Right forearm and hand in brace.  Blister on right heel. Neurologic: Sleepy, responded "no" to yes/no questions but does not appear to be oriented Skin:  Intact without significant lesions or rashes. Abdomen:  Soft, nontender and nondistended. No masses noted. Normal bowel sounds, without guarding, and without rebound.      Lab Results: No results for input(s): WBC, HGB, HCT, PLT in the last 72 hours. BMET No results for input(s): NA, K, CL, CO2, GLUCOSE, BUN, CREATININE, CALCIUM in the last 72 hours. LFT No results for input(s): PROT, ALBUMIN, AST, ALT, ALKPHOS, BILITOT, BILIDIR, IBILI in the last 72 hours. PT/INR No results for input(s): LABPROT, INR in the last 72 hours.  Studies/Results: CT ABDOMEN PELVIS WO CONTRAST  Result Date: 12/29/2019 CLINICAL DATA:  86 year old with dysphagia. Evaluate anatomy for possible percutaneous gastrostomy tube placement. EXAM: CT ABDOMEN AND PELVIS WITHOUT CONTRAST TECHNIQUE: Multidetector CT imaging of the abdomen and pelvis was performed following the standard protocol without IV contrast. COMPARISON:  CT abdomen 10/17/2012 FINDINGS: Lower chest: Motion artifact at the lung bases without significant airspace disease or consolidation. Small focal density in medial left lower lobe probably represents conglomeration of structures and there may be a small amount of atelectasis in this area. No large pleural effusions. Cardiac pacer wires with post CABG changes. Hepatobiliary: Cholelithiasis. No  gallbladder distension. Normal appearance of the liver. Pancreas: Unremarkable. No pancreatic ductal dilatation or surrounding inflammatory changes. Spleen: Normal appearance of the spleen. Adrenals/Urinary Tract: Normal adrenal glands. Negative for kidney stones. Negative for hydronephrosis. No suspicious renal lesions. Urinary bladder is unremarkable. Stomach/Bowel: Nasogastric tube terminates in the distal stomach near the pylorus and duodenal bulb. Normal appearance of the duodenum. There is transverse colon anterior to the stomach body. Evidence for extensive wall thickening involving the rectum. Concern for focal fluid or edema within the left side of the rectal wall, seen on sequence 3, image 73. There is also extensive edema in the perirectal region and presacral region.No evidence for a bowel obstruction. Vascular/Lymphatic: Abdominal aorta is  heavily calcified without aneurysm. No abdominopelvic lymphadenopathy. Reproductive: Numerous calcified fibroids throughout the uterus. Largest fibroid is along the posterior aspect of the uterus and measures up to 5.0 cm. There are also prominent calcifications in the region of the left adnexa and left ovary. Other: Significant edema in the perirectal region and presacral space. Negative for ascites. Negative for free air. Small amount of subcutaneous gas in the left anterior abdomen likely associated with an injection sites. Musculoskeletal: Posterior disc space narrowing at L3-L4. Mild disc space narrowing with endplate irregularity at L4-L5. Facet arthropathy in the lower lumbar spine. Disc space narrowing and endplate irregularity at T10-T11. IMPRESSION: 1. Wall thickening and inflammatory changes involving the rectum. In particular, there appears to be focal thickening or fluid along the left side of the rectum. Findings are suggestive for proctitis. Consider GI consultation. 2. There is transverse colon intervening between the anterior abdominal wall and the  stomach. Patient may not be a candidate for percutaneous gastrostomy tube placement due to this anatomy and would recommend visualization of the transverse colon for an attempted percutaneous gastrostomy tube placement. 3. Multiple calcified uterine fibroids. 4. Cholelithiasis.  No evidence for gallbladder inflammation. Electronically Signed   By: Markus Daft M.D.   On: 12/29/2019 07:59    Impression: Proctitis, as noted on CT imaging.  Etiology could be related to stercoral ulcers versus ischemic versus infectious  Diarrhea without hematochezia or melena.  Patient has normocytic anemia, hemoglobin stable at 9.3 as of 12/26/2019.  Renal function at baseline (CKD IIIB).  BUN 23/creatinine 1.21/GFR 46 as of 12/26/2019.  Hypercalcemia, though last reading was normal limits (9.9 as of 12/26/2019).  Elevations in calcium have been persistent since September/2020 through 12/25/2019.  Additionally calcium was elevated in prior lab draws, including 08/07/2015 per medical record review.  This appears to be an ongoing, chronic issue, possibly related to CKD.  Plan: Flexible sigmoidoscopy on Monday 3/15.  Procedure, risks, benefits reviewed with patient's daughter, [name].  She is interested in proceeding with the procedure on Monday.  Neurology OK with holding Plavix for 3 days, last dose was on 12/29/19, and recommended continuing 81 mg ASA.  Plavix to be resumed post procedure per neurology.  We will need to hold Lovenox 24 hours prior to procedure.  Recommend surgical consult regarding gastrostomy.  Patient's daughter thinks this will be necessary but would like to discuss with surgical team.  ADDENDUM: This patient was seen by Dr.Mann as an outpatient. Dr.Mann has been informed of the consult and will take over her GI care from tomorrow.     LOS: 15 days   Ronnette Juniper, MD  12/29/2019, 12:16 PM

## 2019-12-29 NOTE — Consult Note (Addendum)
[facility] Gastroenterology Consult  Referring Provider: Dr. Alysia Penna Primary Care Physician:  Vicki Panda, MD Primary Gastroenterologist: [name]  Reason for Consultation: Proctitis on CT  HPI: [name] is a 86 y.o. female with past medical history of HTN, heart block (s/p pacemaker), DM type II, diastolic CHF, CKD IIIb, OSA, CAD (s/p PCI), celiac disease, and recent CVA (hospitalized 2/16 to 12/14/2019) with hemiparesis and dysphagia and currently on 32m  Plavix, 81 mg ASA.  She is also on 30 mg Lovenox qd for DVT prophylaxis.  CT today pertinent for evidence for extensive wall thickening involving the rectum. Concern for focal fluid or edema within the left side of the rectal wall. There is also extensive edema in the perirectal region and presacral region.No evidence for a bowel obstruction.  History was difficult to obtain, as patient was very sleepy and unable to stay awake.  She would briefly awaken to voice but then quickly fall back to sleep.  She responded "no" to the following: Abdominal pain, nausea, vomiting, rectal bleeding, unexplained weight loss, heartburn, decreased appetite.  Patient unable to report any additional information.  Asked patient if she has any family history of colon cancer when her last colonoscopy was, but she did not respond.  Per RN, NLexine Baton had some diarrhea this morning, but she noted the patient is on a bowel regimen (MiraLAX, Senna).  She did not note any blood in the stool, the patient denied stomach pain with bowel movements when asked.  Current RN unaware of patient's normal number of bowel movements during this hospitalization or when current diarrhea started.  Of note, patient was recently treated with antibiotics (meropenem> Keflex> ceftriaxone) for UTI (E. Coli/Citrobacter).  Per chart review, appears patient is oriented only to self, often nonverbal, and NOT capable of making decisions on her own behalf.  Patient has an NG tube in place  due to dysphagia secondary to CVA.  Additionally, patient has had stool incontinence during this hospitalization.  Spoke with patient's daughter [name](designated party).  She notes that the patient had recent onset diarrhea prior to hospitalization, which they believed may have been related to a medication.  She denied seeing any bloody or black stools.  She also stated the patient had not complained of any abdominal pain, nausea, vomiting, or other GI symptoms.  She stated the patient had a colonoscopy in 2013 but she is unsure of the findings.  As per patient's daughter, there is no family history of colon cancer.   Last colonoscopy on file was 09/21/2004, and findings included very mild pandiverticulosis as well as multiple sessile polyps (bx: one adenomatous polyp, one hyperplastic polyp; no malignancy or high-grade dysplasia was identified). It was noted that there was no evidence of any masses, polyps, ulcerations, erosions, inflammation or vascular abnormalities in the cecum, ascending, transverse, descending or sigmoid colon.    Past Medical History:  Diagnosis Date  . Anemia   . Arthritis   . Celiac disease   . Chronic systolic CHF (congestive heart failure) [place]   . [place] heart block [place]   . Coronary artery disease   . Diabetes mellitus    INSULIN DEPENDENT  . GERD (gastroesophageal reflux disease)   . Glaucoma   . Headache(784.0)   . Heart disease   . Hypercholesterolemia   . Hypertension   . Iron deficiency anemia, unspecified   . Ischemic cardiomyopathy   . Memory difficulties 04/14/2019  . Shortness of breath   . Sleep apnea  uses cpap  . Stroke ([place])   . Unspecified constipation     Past Surgical History:  Procedure Laterality Date  . BACK SURGERY    . BI-VENTRICULAR PACEMAKER INSERTION (CRT-P)  March 2014   [facility]  . BIV PACEMAKER GENERATOR CHANGEOUT N/A 06/29/2019   Procedure: BIV PACEMAKER GENERATOR CHANGEOUT;  Surgeon: Evans Lance,  MD;  Location: [facility];  Service: Cardiovascular;  Laterality: N/A;  . CARDIAC CATHETERIZATION  09/02/2012  . CARDIAC SURGERY    . CORONARY ANGIOPLASTY WITH STENT PLACEMENT  09/02/2012   RCA  . PERCUTANEOUS CORONARY STENT INTERVENTION (PCI-S) N/A 09/02/2012   Procedure: PERCUTANEOUS CORONARY STENT INTERVENTION (PCI-S);  Surgeon: Clent Demark, MD;  Location: [facility] CATH LAB;  Service: Cardiovascular;  Laterality: N/A;  . PERMANENT PACEMAKER INSERTION N/A 11/29/2012   Procedure: PERMANENT PACEMAKER INSERTION;  Surgeon: Deboraha Sprang, MD;  Location: [facility] CATH LAB;  Service: Cardiovascular;  Laterality: N/A;  . TEMPORARY PACEMAKER INSERTION N/A 11/28/2012   Procedure: TEMPORARY PACEMAKER INSERTION;  Surgeon: Clent Demark, MD;  Location: [facility] CATH LAB;  Service: Cardiovascular;  Laterality: N/A;    Prior to Admission medications   Medication Sig Start Date End Date Taking? Authorizing Provider  amLODipine (NORVASC) 2.5 MG tablet Take 2.5 mg by mouth every morning.    [provider]  aspirin EC 81 MG EC tablet Take 1 tablet (81 mg total) by mouth daily. 12/15/19   Charlynne Cousins, MD  atorvastatin (LIPITOR) 80 MG tablet Take 80 mg by mouth every morning.  04/17/15   [provider]  brimonidine-timolol (COMBIGAN) 0.2-0.5 % ophthalmic solution Place 1 drop into both eyes 2 (two) times daily.    [provider]  calcitRIOL (ROCALTROL) 0.25 MCG capsule Take 0.25 mcg by mouth every morning.    [provider]  carvedilol (COREG) 3.125 MG tablet Take 3.125 mg by mouth 2 (two) times daily. 04/17/15   [provider]  clopidogrel (PLAVIX) 75 MG tablet Take 1 tablet (75 mg total) by mouth daily. 07/03/19   Thurnell Lose, MD  Cod Liver Oil CAPS Take 1 capsule by mouth at bedtime.     [provider]  donepezil (ARICEPT) 10 MG tablet Take 10 mg by mouth at bedtime.  11/07/19   [provider]  dorzolamide (TRUSOPT) 2 % ophthalmic  solution Place 1 drop into both eyes 2 (two) times daily.  07/28/13   [provider]  ferrous sulfate 325 (65 FE) MG tablet Take 325 mg by mouth daily with breakfast.    [provider]  insulin degludec (TRESIBA FLEXTOUCH) 100 UNIT/ML SOPN FlexTouch Pen Inject 10-20 Units into the skin See admin instructions. Inject 10-20 units into the skin at bedtime, based upon CGB (home capillary blood glucose)    [provider]  Insulin Lispro (HUMALOG KWIKPEN) 200 UNIT/ML SOPN Inject 6 Units into the skin 3 (three) times daily as needed (high blood sugar).     [provider]  latanoprost (XALATAN) 0.005 % ophthalmic solution Appointment OVERDUE, place 1 drop into both eyes daily at bedtime Patient taking differently: Place 1 drop into both eyes at bedtime.  03/02/14   Blanchie Serve, MD  linagliptin (TRADJENTA) 5 MG TABS tablet Take 5 mg by mouth every morning.    [provider]  loperamide (IMODIUM) 2 MG capsule Take 2 mg by mouth as needed for diarrhea or loose stools.    [provider]  Multiple Vitamin (MULTIVITAMIN WITH MINERALS)  TABS Take 1 tablet by mouth at bedtime.     [provider]  Multiple Vitamins-Minerals (EYE VITAMINS) CAPS Take 1 capsule by mouth every morning.    [provider]  nitroGLYCERIN (NITROSTAT) 0.4 MG SL tablet Place 0.4 mg under the tongue every 5 (five) minutes as needed for chest pain.  09/03/12   Charolette Forward, MD  NOVOFINE 32G X 6 MM MISC  01/25/13   [provider]  pantoprazole (PROTONIX) 40 MG tablet Take 40 mg by mouth daily before breakfast.  05/07/15   [provider]  valsartan-hydrochlorothiazide (DIOVAN-HCT) 320-12.5 MG tablet Take 1 tablet by mouth every morning.     [provider]    Current Facility-Administered Medications  Medication Dose Route Frequency Provider Last Rate Last Admin  . 0.45 % sodium chloride infusion   Intravenous Continuous Dwana Melena, MD  75 mL/hr at 12/22/19 1312 New Bag at 12/22/19 1312  . acetaminophen (TYLENOL) tablet 650 mg  650 mg Oral Q4H PRN Angiulli, Lavon Paganini, PA-C       Or  . acetaminophen (TYLENOL) 160 MG/5ML solution 650 mg  650 mg Per Tube Q4H PRN Cathlyn Parsons, PA-C   650 mg at 12/22/19 1659   Or  . acetaminophen (TYLENOL) suppository 650 mg  650 mg Rectal Q4H PRN Angiulli, Lavon Paganini, PA-C      . amLODipine (NORVASC) tablet 2.5 mg  2.5 mg Per Tube Daily Kirsteins, Luanna Salk, MD   2.5 mg at 12/29/19 0852  . aspirin chewable tablet 81 mg  81 mg Per J Tube Daily Joselyn Glassman A, RPH   81 mg at 12/29/19 6767  . atorvastatin (LIPITOR) tablet 80 mg  80 mg Per Tube q morning - 10a Joselyn Glassman A, RPH   80 mg at 12/28/19 1801  . brimonidine (ALPHAGAN) 0.2 % ophthalmic solution 1 drop  1 drop Both Eyes BID Cathlyn Parsons, PA-C   1 drop at 12/29/19 2094  . carvedilol (COREG) tablet 3.125 mg  3.125 mg Per Tube BID Joselyn Glassman A, RPH   3.125 mg at 12/29/19 7096  . chlorhexidine (PERIDEX) 0.12 % solution 15 mL  15 mL Mouth Rinse BID Charlett Blake, MD   15 mL at 12/29/19 0820  . cinacalcet (SENSIPAR) tablet 30 mg  30 mg Oral BID WC Dwana Melena, MD   30 mg at 12/29/19 2836  . donepezil (ARICEPT) tablet 10 mg  10 mg Per Tube QHS Pierce, Dwayne A, RPH   10 mg at 12/28/19 2014  . dorzolamide (TRUSOPT) 2 % ophthalmic solution 1 drop  1 drop Both Eyes TID Cathlyn Parsons, PA-C   1 drop at 12/29/19 6294  . enoxaparin (LOVENOX) injection 30 mg  30 mg Subcutaneous Daily Cathlyn Parsons, PA-C   30 mg at 12/29/19 0820  . feeding supplement (JEVITY 1.2 CAL) liquid 1,000 mL  1,000 mL Per Tube Continuous Charlett Blake, MD 60 mL/hr at 12/27/19 2318 1,000 mL at 12/27/19 2318  . feeding supplement (PRO-STAT SUGAR FREE 64) liquid 30 mL  30 mL Per Tube Daily Kirsteins, Luanna Salk, MD   30 mL at 12/29/19 7654  . ferrous sulfate 300 (60 Fe) MG/5ML syrup 300 mg  300 mg Oral Q breakfast Joselyn Glassman A, RPH   300 mg at  12/29/19 6503  . free water 140 mL  140 mL Per Tube Q6H Kirsteins, Luanna Salk, MD   140 mL at 12/29/19 0619  . insulin aspart (  novoLOG) injection 0-15 Units  0-15 Units Subcutaneous Q4H Mercy Riding, MD   5 Units at 12/29/19 0851  . insulin aspart (novoLOG) injection 6 Units  6 Units Subcutaneous Q4H Mercy Riding, MD   6 Units at 12/29/19 0851  . insulin glargine (LANTUS) injection 19 Units  19 Units Subcutaneous BID Kirsteins, Luanna Salk, MD      . ipratropium-albuterol (DUONEB) 0.5-2.5 (3) MG/3ML nebulizer solution 3 mL  3 mL Nebulization Q6H PRN Angiulli, Lavon Paganini, PA-C      . latanoprost (XALATAN) 0.005 % ophthalmic solution 1 drop  1 drop Both Eyes QHS Cathlyn Parsons, PA-C   1 drop at 12/28/19 2032  . MEDLINE mouth rinse  15 mL Mouth Rinse q12n4p Kirsteins, Luanna Salk, MD   15 mL at 12/28/19 1802  . ondansetron (ZOFRAN) tablet 4 mg  4 mg Per Tube Q8H PRN Joselyn Glassman A, RPH      . pantoprazole sodium (PROTONIX) 40 mg/20 mL oral suspension 40 mg  40 mg Per Tube QAC breakfast Joselyn Glassman A, RPH   40 mg at 12/29/19 2229  . polyethylene glycol (MIRALAX / GLYCOLAX) packet 17 g  17 g Per Tube BID Joselyn Glassman A, RPH   17 g at 12/29/19 7989  . senna-docusate (Senokot-S) tablet 1 tablet  1 tablet Per Tube BID Joselyn Glassman A, RPH   1 tablet at 12/29/19 0820  . sodium chloride flush (NS) 0.9 % injection 10-40 mL  10-40 mL Intracatheter Q12H Kirsteins, Luanna Salk, MD   10 mL at 12/29/19 0825  . sodium chloride flush (NS) 0.9 % injection 10-40 mL  10-40 mL Intracatheter PRN Kirsteins, Luanna Salk, MD   10 mL at 12/20/19 0359  . sorbitol 70 % solution 30 mL  30 mL Oral Daily PRN Cathlyn Parsons, PA-C        Allergies as of 12/14/2019  . (No Known Allergies)    Family History  Problem Relation Age of Onset  . Diabetes Mother   . Hypertension Mother   . Hyperlipidemia Mother   . Cancer Sister   . Dementia Sister   . Neuropathy Sister     Social History   Socioeconomic History  .  Marital status: Widowed    Spouse name: Not on file  . Number of children: 4  . Years of education: 18  . Highest education level: Not on file  Occupational History  . Occupation: Retired  Tobacco Use  . Smoking status: Former Smoker    Quit date: 12/26/1976    Years since quitting: 43.0  . Smokeless tobacco: Never Used  Substance and Sexual Activity  . Alcohol use: No    Alcohol/week: 0.0 standard drinks  . Drug use: No  . Sexual activity: Never  Other Topics Concern  . Not on file  Social History Narrative   Regular exercise-no   Caffeine Use-yes   Patient lives at home at home with [name] her daughter.   Retired   Right handed   Education 12th   Caffeine one cup of coffee daily   Social Determinants of Radio broadcast assistant Strain:   . Difficulty of Paying Living Expenses:   Food Insecurity:   . Worried About Charity fundraiser in the Last Year:   . Arboriculturist in the Last Year:   Transportation Needs:   . Film/video editor (Medical):   Marland Kitchen Lack of Transportation (Non-Medical):   Physical Activity:   .  Days of Exercise per Week:   . Minutes of Exercise per Session:   Stress:   . Feeling of Stress :   Social Connections:   . Frequency of Communication with Friends and Family:   . Frequency of Social Gatherings with Friends and Family:   . Attends Religious Services:   . Active Member of Clubs or Organizations:   . Attends Archivist Meetings:   Marland Kitchen Marital Status:   Intimate Partner Violence:   . Fear of Current or Ex-Partner:   . Emotionally Abused:   Marland Kitchen Physically Abused:   . Sexually Abused:     Review of Systems: As noted in HPI.  Physical Exam: Vital signs in last 24 hours: Temp:  [97.9 F (36.6 C)-98.8 F (37.1 C)] 97.9 F (36.6 C) (03/11 0416) Pulse Rate:  [68-79] 68 (03/11 0852) Resp:  [18] 18 (03/11 0416) BP: (120-157)/(47-71) 120/48 (03/11 0852) SpO2:  [96 %-100 %] 96 % (03/11 0416) Last BM Date: 12/28/19  General:  Encountered in bed, sleeping, arouses to voice but quickly falls back to sleep Head:  Normocephalic and atraumatic. Eyes:  Sclera clear, no icterus. Mild pallor Ears:  Normal auditory acuity. Nose:  No deformity, discharge, or lesions.  NG tube in place. Lungs:  Clear throughout to auscultation.   No wheezes, crackles, or rhonchi. No acute distress. Heart:  Regular rate and rhythm; soft systolic murmur, no clicks, rubs,  or gallops. Extremities:  Without edema.  Right forearm and hand in brace.  Blister on right heel. Neurologic: Sleepy, responded "no" to yes/no questions but does not appear to be oriented Skin:  Intact without significant lesions or rashes. Abdomen:  Soft, nontender and nondistended. No masses noted. Normal bowel sounds, without guarding, and without rebound.      Lab Results: No results for input(s): WBC, HGB, HCT, PLT in the last 72 hours. BMET No results for input(s): NA, K, CL, CO2, GLUCOSE, BUN, CREATININE, CALCIUM in the last 72 hours. LFT No results for input(s): PROT, ALBUMIN, AST, ALT, ALKPHOS, BILITOT, BILIDIR, IBILI in the last 72 hours. PT/INR No results for input(s): LABPROT, INR in the last 72 hours.  Studies/Results: CT ABDOMEN PELVIS WO CONTRAST  Result Date: 12/29/2019 CLINICAL DATA:  86 year old with dysphagia. Evaluate anatomy for possible percutaneous gastrostomy tube placement. EXAM: CT ABDOMEN AND PELVIS WITHOUT CONTRAST TECHNIQUE: Multidetector CT imaging of the abdomen and pelvis was performed following the standard protocol without IV contrast. COMPARISON:  CT abdomen 10/17/2012 FINDINGS: Lower chest: Motion artifact at the lung bases without significant airspace disease or consolidation. Small focal density in medial left lower lobe probably represents conglomeration of structures and there may be a small amount of atelectasis in this area. No large pleural effusions. Cardiac pacer wires with post CABG changes. Hepatobiliary: Cholelithiasis. No  gallbladder distension. Normal appearance of the liver. Pancreas: Unremarkable. No pancreatic ductal dilatation or surrounding inflammatory changes. Spleen: Normal appearance of the spleen. Adrenals/Urinary Tract: Normal adrenal glands. Negative for kidney stones. Negative for hydronephrosis. No suspicious renal lesions. Urinary bladder is unremarkable. Stomach/Bowel: Nasogastric tube terminates in the distal stomach near the pylorus and duodenal bulb. Normal appearance of the duodenum. There is transverse colon anterior to the stomach body. Evidence for extensive wall thickening involving the rectum. Concern for focal fluid or edema within the left side of the rectal wall, seen on sequence 3, image 73. There is also extensive edema in the perirectal region and presacral region.No evidence for a bowel obstruction. Vascular/Lymphatic: Abdominal aorta is  heavily calcified without aneurysm. No abdominopelvic lymphadenopathy. Reproductive: Numerous calcified fibroids throughout the uterus. Largest fibroid is along the posterior aspect of the uterus and measures up to 5.0 cm. There are also prominent calcifications in the region of the left adnexa and left ovary. Other: Significant edema in the perirectal region and presacral space. Negative for ascites. Negative for free air. Small amount of subcutaneous gas in the left anterior abdomen likely associated with an injection sites. Musculoskeletal: Posterior disc space narrowing at L3-L4. Mild disc space narrowing with endplate irregularity at L4-L5. Facet arthropathy in the lower lumbar spine. Disc space narrowing and endplate irregularity at T10-T11. IMPRESSION: 1. Wall thickening and inflammatory changes involving the rectum. In particular, there appears to be focal thickening or fluid along the left side of the rectum. Findings are suggestive for proctitis. Consider GI consultation. 2. There is transverse colon intervening between the anterior abdominal wall and the  stomach. Patient may not be a candidate for percutaneous gastrostomy tube placement due to this anatomy and would recommend visualization of the transverse colon for an attempted percutaneous gastrostomy tube placement. 3. Multiple calcified uterine fibroids. 4. Cholelithiasis.  No evidence for gallbladder inflammation. Electronically Signed   By: Markus Daft M.D.   On: 12/29/2019 07:59    Impression: Proctitis, as noted on CT imaging.  Etiology could be related to stercoral ulcers versus ischemic versus infectious  Diarrhea without hematochezia or melena.  Patient has normocytic anemia, hemoglobin stable at 9.3 as of 12/26/2019.  Renal function at baseline (CKD IIIB).  BUN 23/creatinine 1.21/GFR 46 as of 12/26/2019.  Hypercalcemia, though last reading was normal limits (9.9 as of 12/26/2019).  Elevations in calcium have been persistent since September/2020 through 12/25/2019.  Additionally calcium was elevated in prior lab draws, including 08/07/2015 per medical record review.  This appears to be an ongoing, chronic issue, possibly related to CKD.  Plan: Flexible sigmoidoscopy on Monday 3/15.  Procedure, risks, benefits reviewed with patient's daughter, [name].  She is interested in proceeding with the procedure on Monday.  Neurology OK with holding Plavix for 3 days, last dose was on 12/29/19, and recommended continuing 81 mg ASA.  Plavix to be resumed post procedure per neurology.  We will need to hold Lovenox 24 hours prior to procedure.  Recommend surgical consult regarding gastrostomy.  Patient's daughter thinks this will be necessary but would like to discuss with surgical team.  ADDENDUM: This patient was seen by Dr.Mann as an outpatient. Dr.Mann has been informed of the consult and will take over her GI care from tomorrow.     LOS: 15 days   Ronnette Juniper, MD  12/29/2019, 12:16 PM

## 2019-12-29 NOTE — Progress Notes (Addendum)
[place] PHYSICAL MEDICINE & REHABILITATION PROGRESS NOTE   Subjective/Complaints: Appreciate radiology report  as well as nephro note Anatomy not favorable for PEG, would need open procedure   ROS: severe dysarthria cognitive slowing limits ROS Objective:   CT ABDOMEN PELVIS WO CONTRAST  Result Date: 12/29/2019 CLINICAL DATA:  86 year old with dysphagia. Evaluate anatomy for possible percutaneous gastrostomy tube placement. EXAM: CT ABDOMEN AND PELVIS WITHOUT CONTRAST TECHNIQUE: Multidetector CT imaging of the abdomen and pelvis was performed following the standard protocol without IV contrast. COMPARISON:  CT abdomen 10/17/2012 FINDINGS: Lower chest: Motion artifact at the lung bases without significant airspace disease or consolidation. Small focal density in medial left lower lobe probably represents conglomeration of structures and there may be a small amount of atelectasis in this area. No large pleural effusions. Cardiac pacer wires with post CABG changes. Hepatobiliary: Cholelithiasis. No gallbladder distension. Normal appearance of the liver. Pancreas: Unremarkable. No pancreatic ductal dilatation or surrounding inflammatory changes. Spleen: Normal appearance of the spleen. Adrenals/Urinary Tract: Normal adrenal glands. Negative for kidney stones. Negative for hydronephrosis. No suspicious renal lesions. Urinary bladder is unremarkable. Stomach/Bowel: Nasogastric tube terminates in the distal stomach near the pylorus and duodenal bulb. Normal appearance of the duodenum. There is transverse colon anterior to the stomach body. Evidence for extensive wall thickening involving the rectum. Concern for focal fluid or edema within the left side of the rectal wall, seen on sequence 3, image 73. There is also extensive edema in the perirectal region and presacral region.No evidence for a bowel obstruction. Vascular/Lymphatic: Abdominal aorta is heavily calcified without aneurysm. No abdominopelvic  lymphadenopathy. Reproductive: Numerous calcified fibroids throughout the uterus. Largest fibroid is along the posterior aspect of the uterus and measures up to 5.0 cm. There are also prominent calcifications in the region of the left adnexa and left ovary. Other: Significant edema in the perirectal region and presacral space. Negative for ascites. Negative for free air. Small amount of subcutaneous gas in the left anterior abdomen likely associated with an injection sites. Musculoskeletal: Posterior disc space narrowing at L3-L4. Mild disc space narrowing with endplate irregularity at L4-L5. Facet arthropathy in the lower lumbar spine. Disc space narrowing and endplate irregularity at T10-T11. IMPRESSION: 1. Wall thickening and inflammatory changes involving the rectum. In particular, there appears to be focal thickening or fluid along the left side of the rectum. Findings are suggestive for proctitis. Consider GI consultation. 2. There is transverse colon intervening between the anterior abdominal wall and the stomach. Patient may not be a candidate for percutaneous gastrostomy tube placement due to this anatomy and would recommend visualization of the transverse colon for an attempted percutaneous gastrostomy tube placement. 3. Multiple calcified uterine fibroids. 4. Cholelithiasis.  No evidence for gallbladder inflammation. Electronically Signed   By: Markus Daft M.D.   On: 12/29/2019 07:59   No results for input(s): WBC, HGB, HCT, PLT in the last 72 hours. No results for input(s): NA, K, CL, CO2, GLUCOSE, BUN, CREATININE, CALCIUM in the last 72 hours.  Intake/Output Summary (Last 24 hours) at 12/29/2019 0904 Last data filed at 12/29/2019 0843 Gross per 24 hour  Intake 0 ml  Output 1500 ml  Net -1500 ml     Physical Exam: Vital Signs Blood pressure (!) 120/48, pulse 68, temperature 97.9 F (36.6 C), temperature source Oral, resp. rate 18, height 5' 7"  (1.702 m), weight 79.5 kg, SpO2 96  %.   Constitutional: No distress . Vital signs reviewed. Lying in bed, sleeping soundly  General: No acute distress Mood and affect are appropriate Heart: Regular rate and rhythm no rubs murmurs or extra sounds Lungs: Clear to auscultation, breathing unlabored, no rales or wheezes Abdomen: Positive bowel sounds, soft nontender to palpation, nondistended Extremities: No clubbing, cyanosis, or edema Skin: RIght heel blister  Neurologic: Cranial nerves II through XII intact, motor strength is 1/5 in right , 4/5 Left  deltoid, bicep, tricep, grip, hip flexor, knee extensors, ankle dorsiflexor and plantar flexor  Musculoskeletal:  No joint swelling or hand deformities     Assessment/Plan: 1. Functional deficits secondary to Left brainstem vs subcortical infarct which require 3+ hours per day of interdisciplinary therapy in a comprehensive inpatient rehab setting.  Physiatrist is providing close team supervision and 24 hour management of active medical problems listed below.  Physiatrist and rehab team continue to assess barriers to discharge/monitor patient progress toward functional and medical goals  Care Tool:  Bathing  Bathing activity did not occur: Safety/medical concerns Body parts bathed by patient: Face   Body parts bathed by helper: Right arm, Left arm, Chest, Abdomen, Front perineal area, Buttocks, Right upper leg, Left upper leg, Right lower leg, Left lower leg     Bathing assist Assist Level: 2 Helpers     Upper Body Dressing/Undressing Upper body dressing   What is the patient wearing?: Hospital gown only    Upper body assist Assist Level: Total Assistance - Patient < 25%    Lower Body Dressing/Undressing Lower body dressing      What is the patient wearing?: Incontinence brief     Lower body assist Assist for lower body dressing: 2 Helpers     Toileting Toileting Toileting Activity did not occur (Clothing management and hygiene only): N/A (no void or  bm)  Toileting assist Assist for toileting: Dependent - Patient 0%(3 helpers)     Transfers Chair/bed transfer  Transfers assist  Chair/bed transfer activity did not occur: Safety/medical concerns  Chair/bed transfer assist level: Dependent - mechanical lift     Locomotion Ambulation   Ambulation assist   Ambulation activity did not occur: Safety/medical concerns          Walk 10 feet activity   Assist  Walk 10 feet activity did not occur: Safety/medical concerns        Walk 50 feet activity   Assist Walk 50 feet with 2 turns activity did not occur: Safety/medical concerns         Walk 150 feet activity   Assist Walk 150 feet activity did not occur: Safety/medical concerns         Walk 10 feet on uneven surface  activity   Assist Walk 10 feet on uneven surfaces activity did not occur: Safety/medical concerns         Wheelchair     Assist Will patient use wheelchair at discharge?: Yes Type of Wheelchair: Manual Wheelchair activity did not occur: Safety/medical concerns         Wheelchair 50 feet with 2 turns activity    Assist    Wheelchair 50 feet with 2 turns activity did not occur: Safety/medical concerns       Wheelchair 150 feet activity     Assist  Wheelchair 150 feet activity did not occur: Safety/medical concerns       Blood pressure (!) 120/48, pulse 68, temperature 97.9 F (36.6 C), temperature source Oral, resp. rate 18, height 5' 7"  (1.702 m), weight 79.5 kg, SpO2 96 %.    Medical Problem List and  Plan: 1.  Rightt side hemiplegia, dysarthria, +/- aphasia secondary to left brain infarction.  MRI not completed due to pacemaker  -CT demonstrates hypoattenuation in left pons which is consistent with current presentation---increased right hp and increased difficulty with oro-pharyngeal control             -patient may shower             -ELOS/Goals: 25-30 days/min/mod a             Continue PT, OT, SLP     2.  Antithrombotics: -DVT/anticoagulation: Subcutaneous Lovenox             -antiplatelet therapy: Continue Aspirin 81 mg daily, Plavix 75 mg daily 3. Pain Management: ContinueTylenol as needed, pain is well controlled.  4. Mood with memory deficits.  Aricept 10 mg nightly             -antipsychotic agents: N/A 5. Neuropsych: This patient is NOT capable of making decisions on her own behalf. 6. Skin/Wound Care: Routine skin checks 7. Fluids/Electrolytes/Nutrition: Routine in and outs.  CMP ordered for tomorrow a.m. 8.  Hypertension.  Norvasc 2.5 mg daily, Coreg 3.125 mg twice daily.               Monitor with increased mobility Vitals:   12/29/19 0416 12/29/19 0852  BP: (!) 136/47 (!) 120/48  Pulse: 73 68  Resp: 18   Temp: 97.9 F (36.6 C)   SpO2: 96%    Controlled this am, cont above medications   9.  Diabetes mellitus with hyperglycemia.  Hemoglobin A1c 8.2.  Lantus insulin 10 units twice daily, NovoLog 2 units 3 times daily check blood sugars before meals and at bedtime              CBG (last 3)  Recent Labs    12/29/19 0000 12/29/19 0415 12/29/19 0837  GLUCAP 178* 231* 221*  on lantus 17U BID ,  ncrease to 19U BID lantus 10.  CAD with stenting as well as pacemaker.  Continue aspirin and Plavix. 11.  CKD stage IIIb.  Creatinine baseline 1.56.             Normalizing back to baseline  12.  Chronic diastolic congestive heart failure.  Monitor for any signs of fluid overload             Daily weights 13.  OSA.  CPAP. 14.  Hyperlipidemia.  Lipitor 15.  Chronic normocytic anemia.  Continue iron supplement             hgb 10 16.  Post stroke dysphagia: Dysphagia #2 thin liquids.  Follow-up speech therapy.  Advance as tolerated    3/2: see diet/nutrition.  17.  UTI/Citrobacter/EColi:     19. Hypercalcemia- ? Etiology, nephro considering neoplasm related, + rectal wall swelling , will ask GI to eval   20.  Severe Dysphagia, NPO- discussed need for Gastrostomy with  daughter yeaterday , anatomy may preclude PEG and require open procedure     Disposition: Appreciate Dorien Chihuahua RN, SW discussed patient care conference with family yesterday and family expressed understanding.    Proctitis with diarrhea, discussed with GI, needs flex sig with biopsy, per Neuro Dr Erlinda Hong ok to stop plavix x 3 d and cont Baby ASA Resume plavix post procedure   LOS: 15 days A FACE TO FACE EVALUATION WAS PERFORMED  Charlett Blake 12/29/2019, 9:04 AM

## 2019-12-29 NOTE — Progress Notes (Signed)
Team Conference Report to Patient/Family  Team Conference discussion was reviewed with the patient's daughter, including goals down graded, any changes in plan of care ; looking at nutritional means and PEG? placement and target discharge date vs discharge to SNF.  Patient's daughter expressed understanding of information. Reported MD had contacted her re PEG and Chaplain consult requested for AD/POA paperwork.Reviewed likely will start SNF process next week and daughter noted that is what she was told as well.Daughter has given preferred facility list and grand daughters are aware of plan. Dorien Chihuahua B 12/29/2019, 12:06 PM

## 2019-12-29 NOTE — Progress Notes (Signed)
RT NOTE:  Pt is unable to wear CPAP at this time due to NG tube.

## 2019-12-29 NOTE — Progress Notes (Signed)
Request to IR for possible percutaneous gastrostomy placement - CT abd/pelvis performed yesterday for pre-procedure planning which shows findings suggestive of proctitis as well as the presence of transverse colon between the anterior abdominal wall and stomach which may preclude percutaneous placement.  IR could potentially proceed with percutaneous gastrostomy placement however the above noted proctitis would need to be addressed first and the patient would require barium via NGT prior to pre-procedure fluoroscopy to better visualize anatomy. However, the patient's anatomy may still not be suitable for percutaneous placement.  Recommendations:  - Consider GI consult for likely proctitis - Consider surgical consult for possible g-tube placement given anatomy is questionably amenable to percutaneous placement.  - IR will continue to follow chart for now, we are happy to attempt gastrostomy placement if she is not a surgical candidate and once the proctitis is addressed with the understanding that her anatomy may still not be amenable after visualization with barium. Please call with any questions or concerns  Candiss Norse, PA-C Pager: [phone]

## 2019-12-29 NOTE — Progress Notes (Signed)
Physical Therapy Session Note  Patient Details  Name: [name] MRN: [id number] Date of Birth: 01-Mar-1928  Today's Date: 12/29/2019 PT Individual Time: 7357-8978 PT Individual Time Calculation (min): 72 min   Short Term Goals: Week 2:  PT Short Term Goal 1 (Week 2): Pt will maintain sitting balance EOB with min assist consistently up to 3 minutes. PT Short Term Goal 2 (Week 2): Pt will transfer to [facility] with max assist of 1 person. PT Short Term Goal 3 (Week 2): Pt will perform bed mobility with max assis tof 1 consistently  Skilled Therapeutic Interventions/Progress Updates:   Pt received supine in bed and agreeable to PT. PT repositioned pts RLE and noted stool incontinence. Rolling R and x 2 bil with mod assist to the R and max assist to the L for PT to perform pericare with max +2 assist. Supine>sit transfer with total A on the L max cues for sequencing, LE and UE positioning, and use of bed features.   Sitting balance EOB with mod assist progressing to min assist with pillows placed under R elbow to force WB through RUE. Lateral leans R and L with max assist to don pants at bed level.   SB transfer to [facility] with Max A and +2 for safety. RLE blocked by PT as well as Cues for improved anterior weight shift as well as UE placement for increased independence in scoot.   Standing tolerance/trunk NMR in standing frame x 5 minutes while performed lateral/forward reaching to obtain been bags and then placing back in box. Mod assist for trunk control and improved thoracic extension with reaching.   Kinetron 1 min bouts x 5 with mod cues for initiation and symmtry. Pt with significant improvement in use of RLE compared to previous sessions.   Patient returned to room and left sitting in [facility] with call bell in reach and all needs met.          Therapy Documentation Precautions:  Precautions Precautions: Fall Restrictions Weight Bearing Restrictions: No    Vital Signs: Therapy  Vitals Pulse Rate: 68 BP: (!) 120/48 Pain: denies   Therapy/Group: Individual Therapy  Lorie Phenix 12/29/2019, 10:24 AM

## 2019-12-29 NOTE — Progress Notes (Signed)
Speech Language Pathology Weekly Progress and Session Note  Patient Details  Name: [name] MRN: [id number] Date of Birth: 1928/08/16  Beginning of progress report period: December 22, 2019 End of progress report period: December 29, 2019  Today's Date: 12/29/2019 SLP Individual Time: 1100-1157 SLP Individual Time Calculation (min): 57 min  Short Term Goals: Week 2: SLP Short Term Goal 1 (Week 2): Pt will consume trials of ice chips, demonstrating inititaion of swallow sequence in 1/5 opportunities with Max A multimodal cues. SLP Short Term Goal 1 - Progress (Week 2): Met SLP Short Term Goal 2 (Week 2): Pt will sustain attention to functional familiar tasks for 2 minute intervals with Max A multimodal cues. SLP Short Term Goal 2 - Progress (Week 2): Met SLP Short Term Goal 3 (Week 2): Pt will demonstrate ability to problem solve during basic functional tasks with Max A verbal/visual cues. SLP Short Term Goal 3 - Progress (Week 2): Met SLP Short Term Goal 4 (Week 2): Pt will demonstrate verbal and physical initiation in 50% opportunities with Max A multimodal cues. SLP Short Term Goal 4 - Progress (Week 2): Met SLP Short Term Goal 5 (Week 2): Pt orient to place and time with Min A verbal/visual cues for use of aids. SLP Short Term Goal 5 - Progress (Week 2): Partly met SLP Short Term Goal 6 (Week 2): Pt will respond to Mod A multimodal cues for use of increased vocal intensity to increase speech intelligibility at the word or short phrase level. SLP Short Term Goal 6 - Progress (Week 2): Progressing toward goal    New Short Term Goals: Week 3: SLP Short Term Goal 1 (Week 3): Pt will consume therapeutic trials of ice chips and/or thin liquids with efficient oral manipulation of bolus and timely swallow initiation with Max A multimodal cues. SLP Short Term Goal 2 (Week 3): Pt will increase speech intelligibility to 60% at the word level with Max A multimodal cues for use of increased vocal  intensity. SLP Short Term Goal 3 (Week 3): Pt will sustain attentoin to functional tasks for 10 minute intervals with Mod A verbal/visual cues. SLP Short Term Goal 4 (Week 3): Pt will demonstrate problem solving during basic faimliar tasks with Mod A verbal/visual cues.  Weekly Progress Updates: Pt has made slow but functional gains and met 4 out of 6 short term goals. Pt is currently Mod-Max assist for due to severe oropharyngeal dysphagia and dysarthria as well as fluctuating levels of arousal and cognitive impairments impacting initiation, problem solving, and short term memory. Pt is currently NPO due to severity of oral and pharyngeal deficits with alternative means of nutrition. Minimal progress toward swallow goals met this week. Pt has demonstrated improved basic problem solving, sustained attention, and orientation when fully alter. Pt education is ongoing and family has changed d/c plans to SNF in order to provide appropriate level of care. Pt would continue to benefit from skilled ST while inpatient in order to maximize functional independence and reduce burden of care prior to discharge. Anticipate that pt will need 24/7 supervision at discharge in addition to [place] follow up at next level of care.       Intensity: Minumum of 1-2 x/day, 30 to 90 minutes Frequency: 3 to 5 out of 7 days Duration/Length of Stay: Pending SNF placement Treatment/Interventions: Patient/family education;Cognitive remediation/compensation;Cueing hierarchy;Dysphagia/aspiration precaution training;Functional tasks;Environmental controls;Internal/external aids;Speech/Language facilitation   Daily Session  Skilled Therapeutic Interventions: Pt was seen for skilled ST targeting dysphagia and  cognitive goals. Pt continues to present with wet/gurgly vocal quality at baseline and requires Max A multimodal cues for use of throat clear and suctioning for secretion management. She also exhibited moderate amount of pooled  secretions that spilled anteriorly out of oral cavity throughout session. Pt with very weak lingual manipulation and oral transit of ice chip. Delayed cough response noted. Recommend pt continue NPO. No oral apraxia was noted during repeat oral mechanism exam today. During a basic calendar creation task, although pt only required Min A verbal and visual cues for awareness of errors in sequencing, increased Mod A required for problem solving. She sustained attention to tasks for 20 minute intervals with Min A for redirection. Pt left laying in bed with alarm set and needs within reach. Continue per current plan of care.        Pain Pain Assessment Pain Scale: 0-10 Pain Score: 0-No pain  Therapy/Group: Individual Therapy  Arbutus Leas 12/29/2019, 7:27 AM

## 2019-12-30 ENCOUNTER — Inpatient Hospital Stay (HOSPITAL_COMMUNITY): Payer: Medicare Other | Admitting: Occupational Therapy

## 2019-12-30 ENCOUNTER — Encounter (HOSPITAL_COMMUNITY)
Admission: RE | Disposition: A | Discharge status: Skilled Nursing Facility | Payer: Self-pay | Source: Intra-hospital | Attending: Physical Medicine & Rehabilitation

## 2019-12-30 ENCOUNTER — Ambulatory Visit (HOSPITAL_COMMUNITY): Admit: 2019-12-30 | Payer: Medicare Other | Admitting: Gastroenterology

## 2019-12-30 ENCOUNTER — Inpatient Hospital Stay (HOSPITAL_COMMUNITY): Payer: Medicare Other | Admitting: Physical Therapy

## 2019-12-30 ENCOUNTER — Inpatient Hospital Stay (HOSPITAL_COMMUNITY): Payer: Medicare Other | Admitting: Speech Pathology

## 2019-12-30 HISTORY — PX: FLEXIBLE SIGMOIDOSCOPY: SHX5431

## 2019-12-30 LAB — GLUCOSE, CAPILLARY
Glucose-Capillary: 164 mg/dL — ABNORMAL HIGH (ref 70–99)
Glucose-Capillary: 158 mg/dL — ABNORMAL HIGH (ref 70–99)
Glucose-Capillary: 130 mg/dL — ABNORMAL HIGH (ref 70–99)
Glucose-Capillary: 74 mg/dL (ref 70–99)
Glucose-Capillary: 158 mg/dL — ABNORMAL HIGH (ref 70–99)

## 2019-12-30 SURGERY — SIGMOIDOSCOPY, FLEXIBLE

## 2019-12-30 MED ORDER — SODIUM CHLORIDE 0.9 % IV SOLN: INTRAVENOUS | Status: DC

## 2019-12-30 NOTE — Progress Notes (Signed)
Occupational Therapy Weekly Progress Note  Patient Details  Name: [name] MRN: [id number] Date of Birth: 10/09/1928  Beginning of progress report period: 12/23/2019 End of progress report period: 12/30/2019  Patient has met 1 of 3 short term goals.    Pt has made slow progress at time of report. She still requires Total to +2 assist for self care tasks and BSC transfers using [place]. Pt continues to be functionally limited by profound lethargy, apraxia, dense Rt hemiparesis, and trunk control deficits. Continue OT POC, family considering SNF placement at this time.   Patient continues to demonstrate the following deficits: muscle weakness and muscle paralysis, decreased cardiorespiratoy endurance, abnormal tone, unbalanced muscle activation, decreased coordination and decreased motor planning, decreased initiation, decreased attention, decreased awareness, decreased problem solving, decreased safety awareness and delayed processing and decreased sitting balance, decreased standing balance, decreased postural control and hemiplegia and therefore will continue to benefit from skilled OT intervention to enhance overall performance with BADL.  Patient progressing toward long term goals..  Continue plan of care.  OT Short Term Goals Week 2:  OT Short Term Goal 1 (Week 2): Pt will complete 1 grooming task at the sink with mod cues for sequencing OT Short Term Goal 1 - Progress (Week 2): Not met OT Short Term Goal 2 (Week 2): Pt will complete sit<stand in [place] with 1 assist during toileting or dressing tasks OT Short Term Goal 2 - Progress (Week 2): Not met OT Short Term Goal 3 (Week 2): Pt will complete 1 self care task EOB with Mod A for sitting balance OT Short Term Goal 3 - Progress (Week 2): Met Week 3:  OT Short Term Goal 1 (Week 3): Pt will thread her affected arm into shirt sleeve with Mod A to improve Rt sided attention OT Short Term Goal 2 (Week 3): Pt will release grip on bedrail  during functional bed mobility related to dressing or toileting with no more than mod multimodal cuing       Therapy Documentation Precautions:  Precautions Precautions: Fall Restrictions Weight Bearing Restrictions: No   Vital Signs: Therapy Vitals Temp: (!) 97.1 F (36.2 C) Temp Source: Temporal Pulse Rate: 74 Resp: 20 BP: (!) 150/56 Oxygen Therapy SpO2: 97 % O2 Device: Room Air ADL:       Therapy/Group: Individual Therapy   A  12/30/2019, 3:11 PM  

## 2019-12-30 NOTE — Progress Notes (Signed)
[facility] PHYSICAL MEDICINE & REHABILITATION PROGRESS NOTE   Subjective/Complaints: Appreciate GI note,  Anatomy not favorable for PEG, would need open procedure Discussed with pt   ROS: severe dysarthria cognitive slowing limits ROS Objective:   CT ABDOMEN PELVIS WO CONTRAST  Result Date: 12/29/2019 CLINICAL DATA:  86 year old with dysphagia. Evaluate anatomy for possible percutaneous gastrostomy tube placement. EXAM: CT ABDOMEN AND PELVIS WITHOUT CONTRAST TECHNIQUE: Multidetector CT imaging of the abdomen and pelvis was performed following the standard protocol without IV contrast. COMPARISON:  CT abdomen 10/17/2012 FINDINGS: Lower chest: Motion artifact at the lung bases without significant airspace disease or consolidation. Small focal density in medial left lower lobe probably represents conglomeration of structures and there may be a small amount of atelectasis in this area. No large pleural effusions. Cardiac pacer wires with post CABG changes. Hepatobiliary: Cholelithiasis. No gallbladder distension. Normal appearance of the liver. Pancreas: Unremarkable. No pancreatic ductal dilatation or surrounding inflammatory changes. Spleen: Normal appearance of the spleen. Adrenals/Urinary Tract: Normal adrenal glands. Negative for kidney stones. Negative for hydronephrosis. No suspicious renal lesions. Urinary bladder is unremarkable. Stomach/Bowel: Nasogastric tube terminates in the distal stomach near the pylorus and duodenal bulb. Normal appearance of the duodenum. There is transverse colon anterior to the stomach body. Evidence for extensive wall thickening involving the rectum. Concern for focal fluid or edema within the left side of the rectal wall, seen on sequence 3, image 73. There is also extensive edema in the perirectal region and presacral region.No evidence for a bowel obstruction. Vascular/Lymphatic: Abdominal aorta is heavily calcified without aneurysm. No abdominopelvic  lymphadenopathy. Reproductive: Numerous calcified fibroids throughout the uterus. Largest fibroid is along the posterior aspect of the uterus and measures up to 5.0 cm. There are also prominent calcifications in the region of the left adnexa and left ovary. Other: Significant edema in the perirectal region and presacral space. Negative for ascites. Negative for free air. Small amount of subcutaneous gas in the left anterior abdomen likely associated with an injection sites. Musculoskeletal: Posterior disc space narrowing at L3-L4. Mild disc space narrowing with endplate irregularity at L4-L5. Facet arthropathy in the lower lumbar spine. Disc space narrowing and endplate irregularity at T10-T11. IMPRESSION: 1. Wall thickening and inflammatory changes involving the rectum. In particular, there appears to be focal thickening or fluid along the left side of the rectum. Findings are suggestive for proctitis. Consider GI consultation. 2. There is transverse colon intervening between the anterior abdominal wall and the stomach. Patient may not be a candidate for percutaneous gastrostomy tube placement due to this anatomy and would recommend visualization of the transverse colon for an attempted percutaneous gastrostomy tube placement. 3. Multiple calcified uterine fibroids. 4. Cholelithiasis.  No evidence for gallbladder inflammation. Electronically Signed   By: Markus Daft M.D.   On: 12/29/2019 07:59   No results for input(s): WBC, HGB, HCT, PLT in the last 72 hours. No results for input(s): NA, K, CL, CO2, GLUCOSE, BUN, CREATININE, CALCIUM in the last 72 hours.  Intake/Output Summary (Last 24 hours) at 12/30/2019 0748 Last data filed at 12/30/2019 0300 Gross per 24 hour  Intake 876 ml  Output 1302 ml  Net -426 ml     Physical Exam: Vital Signs Blood pressure (!) 138/52, pulse 69, temperature 97.9 F (36.6 C), temperature source Oral, resp. rate 18, height 5' 7"  (1.702 m), weight 80.1 kg, SpO2 100  %.   Constitutional: No distress . Vital signs reviewed. Lying in bed, sleeping soundly    General:  No acute distress Mood and affect are appropriate Heart: Regular rate and rhythm no rubs murmurs or extra sounds Lungs: Clear to auscultation, breathing unlabored, no rales or wheezes Abdomen: Positive bowel sounds, soft nontender to palpation, nondistended Extremities: No clubbing, cyanosis, or edema Skin: No evidence of breakdown, no evidence of rash   Musculoskeletal:Pain with Right shoulder ROM, dorsum of Right hand edema Skin: RIght heel blister - PRAFO at bedside  Neurologic:, motor strength is 1/5 in right biceps/triceps o/w 0/5, 4/5 Left  deltoid, bicep, tricep, grip, hip flexor, knee extensors, ankle dorsiflexor and plantar flexor      Assessment/Plan: 1. Functional deficits secondary to Left brainstem vs subcortical infarct which require 3+ hours per day of interdisciplinary therapy in a comprehensive inpatient rehab setting.  Physiatrist is providing close team supervision and 24 hour management of active medical problems listed below.  Physiatrist and rehab team continue to assess barriers to discharge/monitor patient progress toward functional and medical goals  Care Tool:  Bathing  Bathing activity did not occur: Safety/medical concerns Body parts bathed by patient: Face   Body parts bathed by helper: Right arm, Left arm, Chest, Abdomen, Front perineal area, Buttocks, Right upper leg, Left upper leg, Right lower leg, Left lower leg     Bathing assist Assist Level: 2 Helpers     Upper Body Dressing/Undressing Upper body dressing   What is the patient wearing?: Hospital gown only    Upper body assist Assist Level: Total Assistance - Patient < 25%    Lower Body Dressing/Undressing Lower body dressing      What is the patient wearing?: Incontinence brief     Lower body assist Assist for lower body dressing: 2 Helpers     Toileting Toileting  Toileting Activity did not occur (Clothing management and hygiene only): N/A (no void or bm)  Toileting assist Assist for toileting: Dependent - Patient 0%(3 helpers)     Transfers Chair/bed transfer  Transfers assist  Chair/bed transfer activity did not occur: Safety/medical concerns  Chair/bed transfer assist level: 2 Helpers     Locomotion Ambulation   Ambulation assist   Ambulation activity did not occur: Safety/medical concerns          Walk 10 feet activity   Assist  Walk 10 feet activity did not occur: Safety/medical concerns        Walk 50 feet activity   Assist Walk 50 feet with 2 turns activity did not occur: Safety/medical concerns         Walk 150 feet activity   Assist Walk 150 feet activity did not occur: Safety/medical concerns         Walk 10 feet on uneven surface  activity   Assist Walk 10 feet on uneven surfaces activity did not occur: Safety/medical concerns         Wheelchair     Assist Will patient use wheelchair at discharge?: Yes Type of Wheelchair: Manual Wheelchair activity did not occur: Safety/medical concerns         Wheelchair 50 feet with 2 turns activity    Assist    Wheelchair 50 feet with 2 turns activity did not occur: Safety/medical concerns       Wheelchair 150 feet activity     Assist  Wheelchair 150 feet activity did not occur: Safety/medical concerns       Blood pressure (!) 138/52, pulse 69, temperature 97.9 F (36.6 C), temperature source Oral, resp. rate 18, height 5' 7"  (1.702 m), weight 80.1 kg,  SpO2 100 %.    Medical Problem List and Plan: 1.  Rightt side hemiplegia, dysarthria, +/- aphasia secondary to left brain infarction.  MRI not completed due to pacemaker  -CT demonstrates hypoattenuation in left pons which is consistent with current presentation---increased right hp and increased difficulty with oro-pharyngeal control             -patient may shower              Plan is for SNF after CIR              Continue PT, OT, SLP    2.  Antithrombotics: -DVT/anticoagulation: Subcutaneous Lovenox             -antiplatelet therapy: Continue Aspirin 81 mg daily, Plavix 75 mg daily 3. Pain Management: ContinueTylenol as needed, pain is well controlled.  4. Mood with memory deficits.  Aricept 10 mg nightly             -antipsychotic agents: N/A 5. Neuropsych: This patient is NOT capable of making decisions on her own behalf. 6. Skin/Wound Care: Routine skin checks 7. Fluids/Electrolytes/Nutrition: Routine in and outs.  CMP ordered for tomorrow a.m. 8.  Hypertension.  Norvasc 2.5 mg daily, Coreg 3.125 mg twice daily.               Monitor with increased mobility Vitals:   12/29/19 1934 12/30/19 0446  BP: (!) 151/60 (!) 138/52  Pulse: 70 69  Resp: 18 18  Temp: 98.2 F (36.8 C) 97.9 F (36.6 C)  SpO2: 99% 100%   Controlled 3/12 9.  Diabetes mellitus with hyperglycemia.  Hemoglobin A1c 8.2.  Lantus insulin 10 units twice daily, NovoLog 2 units 3 times daily check blood sugars before meals and at bedtime              CBG (last 3)  Recent Labs    12/29/19 2018 12/29/19 2347 12/30/19 0446  GLUCAP 120* 169* 164*  improved  19U BID lantus 10.  CAD with stenting as well as pacemaker.  Continue aspirin and Plavix. 11.  CKD stage IIIb.  Creatinine baseline 1.56.             Normalizing back to baseline  12.  Chronic diastolic congestive heart failure.  Monitor for any signs of fluid overload             Daily weights 13.  OSA.  CPAP. 14.  Hyperlipidemia.  Lipitor 15.  Chronic normocytic anemia.  Continue iron supplement             hgb 10 16.  Post stroke dysphagia: Dysphagia #2 thin liquids.  Follow-up speech therapy.  Advance as tolerated    3/2: see diet/nutrition.  17.  UTI/Citrobacter/EColi:  Resolved    19. Hypercalcemia- ? Etiology, nephro considering neoplasm related, + rectal wall swelling , GI to perform flex sig on Monday  20.  Severe  Dysphagia, NPO-      Disposition: Appreciate Dorien Chihuahua RN, SW discussed patient care conference with family yesterday and family expressed understanding.    Proctitis with diarrhea, discussed with GI, needs flex sig with biopsy, per Neuro Dr Erlinda Hong ok to stop plavix resume after procedures and cont Baby ASA Resume plavix G tube, ask Gen surg to eval   LOS: 16 days A FACE TO FACE EVALUATION WAS PERFORMED  Charlett Blake 12/30/2019, 7:48 AM

## 2019-12-30 NOTE — Progress Notes (Signed)
Responded to [facility] for notary of AD.  Upon visiting w/ Ms. Deroos in her room and finding it difficult to initiate a relationship with her, consulted with her nurse.  Because of her weakness on both sides as a result of the stroke and because of her inability to connect with Chaplain, given her daughter is the person with whom the mcp speaks, nurse and Chaplain determined Ms. Maiorino would not be either able to sign or understand the content of the AD.  De Burrs Chaplain Resident

## 2019-12-30 NOTE — Progress Notes (Signed)
Physical Therapy Weekly Progress Note  Patient Details  Name: [name] MRN: [id number] Date of Birth: 04-06-1928  Beginning of progress report period: December 23, 2019 End of progress report period: December 30, 2019  Today's Date: 12/30/2019 PT Individual Time: 0900-1000 1300-1327 PT Individual Time Calculation (min): 60 min and 27 min   Patient has met 2 of 3 short term goals.  Pt is making slow progress towards LTG. Currently pt is able to performed bed mobility with max assist of 1 to total A. Sitting balance has progressed to min assist with moderate cues for midline and awareness of LOB. SB transfer with max assist of 1 intermittently. Improved initiation of movement and proximal movement noted in RUE/RLE. Pt's arousal and alertness has also greatly improved allowing improved participation in therapies over the past week. Family is considering SNF placement given slow progress towards LTG.  Patient continues to demonstrate the following deficits muscle weakness, muscle joint tightness and muscle paralysis, decreased cardiorespiratoy endurance, impaired timing and sequencing, abnormal tone, unbalanced muscle activation and decreased coordination, decreased visual perceptual skills and field cut, decreased attention to right and decreased sitting balance, decreased standing balance, decreased postural control, hemiplegia and decreased balance strategies and therefore will continue to benefit from skilled PT intervention to increase functional independence with mobility.  Patient progressing toward long term goals..  Continue plan of care.  PT Short Term Goals Week 2:  PT Short Term Goal 1 (Week 2): Pt will maintain sitting balance EOB with min assist consistently up to 3 minutes. PT Short Term Goal 1 - Progress (Week 2): Met PT Short Term Goal 2 (Week 2): Pt will transfer to [facility] with max assist of 1 person. PT Short Term Goal 2 - Progress (Week 2): Met PT Short Term Goal 3 (Week 2): Pt  will perform bed mobility with max assis tof 1 consistently PT Short Term Goal 3 - Progress (Week 2): Progressing toward goal Week 3:  PT Short Term Goal 1 (Week 3): Pt will perform bed mobility with max assist PT Short Term Goal 2 (Week 3): Pt will transfer to [facility] with mod assist PT Short Term Goal 3 (Week 3): Pt will sustain standing in parallel bars up to 45 seconds.  Skilled Therapeutic Interventions/Progress Updates:   Pt received supine in bed and agreeable to PT. trunkal NMR with Rolling R and L with mod assist to the R and max assist to the L to place bed pan. Pt able to have small BM while on bed pan with mucus appearance. Additional trunkal NMR to Roll R and L to don pants at bed level. PT applied bandage to R heel pressure injury. And donned shoes for protection. Supine sit>sit on the R side of the bed with total A, as pt unable to push with RUE into siting position. Sitting balance EOB x 5 minutes with min assist once feet on floor. Visual and tactile cues for awareness of midline. SB transfer to [facility] with max assist of 1 and max cues for sequencing and RLE block to force WB through BLE.   Pt transported to orthoygm in [facility]. Standing tolerance/lateral reaching task in standing frame to the L followed cross body reach to the R x 8. Forced WB through the RUE while engaged in reaching task with mod assist to improve erect thoracic extension. Patient returned to room and left sitting in [facility] with call bell in reach and all needs met.      Session 2.  Pt received supine in bed and agreeable to PT. AAROM for the RLE for decrease tone and improve hip mobility at limit pain with movement. Supine>sit transfer with max assist to roll top the L assist and mod assist to push into sitting on the L Sitting balance EOB with min assist overall and moderate cues for midline orientation. Lateral reaches following by cross body reach with the LUE.  AAROM on the RUE for shoulder flexion/extension 2 x 10 with elbow  flexion x 10 while maintaining sitting balance with min assist. Moderate cues intermittently to prevent excessive lateral lean to the L. Sit>supine completed with total A+2 for safety and left supine in bed with call bell in reach and all needs met.     Therapy Documentation Precautions:  Precautions Precautions: Fall Restrictions Weight Bearing Restrictions: No Vital Signs: Therapy Vitals Pulse Rate: 70 BP: 130/60 Pain: denies Therapy/Group: Individual Therapy  Lorie Phenix 12/30/2019, 10:07 AM

## 2019-12-30 NOTE — Progress Notes (Signed)
Speech Language Pathology Daily Session Note  Patient Details  Name: [name] MRN: [id number] Date of Birth: [birth year]  Today's Date: 12/30/2019 SLP Individual Time: 1015-1030 SLP Individual Time Calculation (min): 15 min  Short Term Goals: Week 3: SLP Short Term Goal 1 (Week 3): Pt will consume therapeutic trials of ice chips and/or thin liquids with efficient oral manipulation of bolus and timely swallow initiation with Max A multimodal cues. SLP Short Term Goal 2 (Week 3): Pt will increase speech intelligibility to 60% at the word level with Max A multimodal cues for use of increased vocal intensity. SLP Short Term Goal 3 (Week 3): Pt will sustain attentoin to functional tasks for 10 minute intervals with Mod A verbal/visual cues. SLP Short Term Goal 4 (Week 3): Pt will demonstrate problem solving during basic faimliar tasks with Mod A verbal/visual cues.  Skilled Therapeutic Interventions:  Skilled treatment session targeted cognition goals. SLP recieved pt upright in tilt-in-space wheelchair. Pt was asleep and required extensive cues (both verbal and tactile) to arouse for brief seconds. Pt unable to engage in structured tasks this day d/t level of fatigue. As a result, pt missed 30 minutes of skilled ST. Pt left upright in tilt-in-space wheelchair with all needs within reach. Continue per current plan of care.      Pain    Therapy/Group: Individual Therapy  [name] 12/30/2019, 1:56 PM

## 2019-12-30 NOTE — Progress Notes (Signed)
Patient tolerated procedure well,denies pain or discomfort at this time and is resting well. Tube feeding continues at 60 ml/hr and is tolerating it well.

## 2019-12-30 NOTE — Progress Notes (Addendum)
Patient going down for procedure today. Orders to be NPO at this time, insulin held at this time, tube feeding is off. Patient is in bed resting. Informed consent to be obtained by procedure staff.

## 2019-12-30 NOTE — Plan of Care (Signed)
  Problem: RH Car Transfers Goal: LTG Patient will perform car transfers with assist (PT) Description: LTG: Patient will perform car transfers with assistance (PT). Outcome: Not Applicable Flowsheets (Taken 12/30/2019 1731) LTG: Pt will perform car transfers with assist:: (goal d/c due to lack of progress) -- Note: goal d/c due to lack of progress   Problem: RH Ambulation Goal: LTG Patient will ambulate in controlled environment (PT) Description: LTG: Patient will ambulate in a controlled environment, # of feet with assistance (PT). Outcome: Not Applicable Flowsheets (Taken 12/30/2019 1731) LTG: Pt will ambulate in controlled environ  assist needed:: (goal d/c due to lack of progress) -- Note: goal d/c due to lack of progress

## 2019-12-30 NOTE — Interval H&P Note (Signed)
History and Physical Interval Note:  12/30/2019 3:25 PM  [name]  has presented today for surgery, with the diagnosis of Abnormal CT scan and diarrhea.  The various methods of treatment have been discussed with the patient and family. After consideration of risks, benefits and other options for treatment, the patient has consented to  Procedure(s): FLEXIBLE SIGMOIDOSCOPY (N/A) as a surgical intervention.  The patient's history has been reviewed, patient examined, no change in status, stable for surgery.  I have reviewed the patient's chart and labs.  Questions were answered to the patient's satisfaction.     Veva Grimley D

## 2019-12-30 NOTE — Progress Notes (Signed)
Occupational Therapy Session Note  Patient Details  Name: [name] MRN: [id number] Date of Birth: [birth year]  Today's Date: 12/30/2019 OT Individual Time:  - 75 minutes missed      Short Term Goals: Week 2:  OT Short Term Goal 1 (Week 2): Pt will complete 1 grooming task at the sink with mod cues for sequencing OT Short Term Goal 2 (Week 2): Pt will complete sit<stand in Gross with 1 assist during toileting or dressing tasks OT Short Term Goal 3 (Week 2): Pt will complete 1 self care task EOB with Mod A for sitting balance  Skilled Therapeutic Interventions/Progress Updates:    Attempted to see pt for scheduled OT however pt at endoscopy. 75 minutes missed.  Therapy Documentation Precautions:  Precautions Precautions: Fall Restrictions Weight Bearing Restrictions: No Vital Signs: Therapy Vitals Temp: 97.9 F (36.6 C) Temp Source: Oral Pulse Rate: 69 Resp: 18 BP: (!) 138/52 Patient Position (if appropriate): Lying Oxygen Therapy SpO2: 100 % O2 Device: Room Air ADL:       Therapy/Group: Individual Therapy  [name] 12/30/2019, 7:30 AM

## 2019-12-31 ENCOUNTER — Inpatient Hospital Stay (HOSPITAL_COMMUNITY): Payer: Medicare Other | Admitting: Occupational Therapy

## 2019-12-31 LAB — GLUCOSE, CAPILLARY
Glucose-Capillary: 111 mg/dL — ABNORMAL HIGH (ref 70–99)
Glucose-Capillary: 138 mg/dL — ABNORMAL HIGH (ref 70–99)
Glucose-Capillary: 158 mg/dL — ABNORMAL HIGH (ref 70–99)
Glucose-Capillary: 173 mg/dL — ABNORMAL HIGH (ref 70–99)
Glucose-Capillary: 180 mg/dL — ABNORMAL HIGH (ref 70–99)
Glucose-Capillary: 213 mg/dL — ABNORMAL HIGH (ref 70–99)

## 2019-12-31 NOTE — Progress Notes (Signed)
CBG is 111 at this time. Held all fast acting insulin, will reassess in 4 hours

## 2019-12-31 NOTE — Progress Notes (Signed)
Occupational Therapy Session Note  Patient Details  Name: [name] MRN: [id number] Date of Birth: [birth year]  Today's Date: 12/31/2019 OT Individual Time: 2774-1287 OT Individual Time Calculation (min): 56 min   Short Term Goals: Week 3:  OT Short Term Goal 1 (Week 3): Pt will thread her affected arm into shirt sleeve with Mod A to improve Rt sided attention OT Short Term Goal 2 (Week 3): Pt will release grip on bedrail during functional bed mobility related to dressing or toileting with no more than mod multimodal cuing  Skilled Therapeutic Interventions/Progress Updates:    Pt greeted in bed, appearing alert, trying to verbalize something to therapist but even with increased time, she was unable to. Pt able to say "no" when asked if she had pain, when asked if she felt alright pt would just say "I don't know." Pt unsure if her brief was soiled or clean, found to be clean this AM so started with LB dressing bedlevel. Pt able to elevate her Lt leg against gravity to assist with dressing when given cue, also able to pull fabric up a little towards upper leg when the fabric was placed in her Lt hand. Pt rolled Rt>Lt with Max A while 2nd helper assisted with elevating pants over hips. Pt did well with releasing her grasp on the bedrail when rolling without vcs, did need A to grip bedrails initially during all rolling activity. Footwear donned Total A, RN donned pad to protect Rt heel beforehand. Total A for supine<sit and Total A of 1 for slideboard<TIS with 2nd helper stabilizing equipment for safety. While sitting in neutral position, pt engaged in UB self care tasks at the sink. Much improved ability to follow 1 step instruction, with pt washing face, half of Lt upper arm, and chest with cuing alone. Pt required HOH to incorporate Lt arm functionally to wash Rt side. Pt assisted with dressing Lt side with cuing to pull shirt over elbow. Pt still exhibits visual impairment, often overshooting or  undershooting target when asked to locate her shirt sleeve with the Lt hand. Min A to weight shift forward to pull shirt over trunk. Noted overall improvement in initiation during session today. At end of tx pt was reclined in TIS with safety belt fastened, Rt arm elevated on full lap tray for edema mgt, and soft call bell.    Therapy Documentation Precautions:  Precautions Precautions: Fall Restrictions Weight Bearing Restrictions: No Pain: Pain Assessment Pain Scale: Faces Faces Pain Scale: Hurts even more Pain Type: Acute pain Pain Location: Leg Pain Orientation: Right Pain Descriptors / Indicators: Grimacing;Guarding Pain Onset: With Activity ADL:       Therapy/Group: Individual Therapy  Rasheed Welty A Rosetta Rupnow 12/31/2019, 12:47 PM

## 2019-12-31 NOTE — Progress Notes (Addendum)
[facility] PHYSICAL MEDICINE & REHABILITATION PROGRESS NOTE   Subjective/Complaints:   Pt reports no pain- admits she' doesn't know" how she's doing.   Nursing concerned about spot on R heel- wants [place] to take a look.   ROS: severe dysarthria cognitive slowing limits ROS Objective:   No results found. No results for input(s): WBC, HGB, HCT, PLT in the last 72 hours. No results for input(s): NA, K, CL, CO2, GLUCOSE, BUN, CREATININE, CALCIUM in the last 72 hours.  Intake/Output Summary (Last 24 hours) at 12/31/2019 1825 Last data filed at 12/31/2019 1306 Gross per 24 hour  Intake 0 ml  Output 1500 ml  Net -1500 ml     Physical Exam: Vital Signs Blood pressure (!) 119/50, pulse 73, temperature 98 F (36.7 C), resp. rate 17, height 5' 7"  (1.702 m), weight 82.3 kg, SpO2 100 %.   Constitutional: sitting up with NGT in place; getting TFs; PT at bedside doing therapy; nursing also at bedside, NAD General: No acute distress HEENT: teeth clenched and dysarthric Heart: RRR Lungs: CTA b/L- sounds a little coarse prior but better after cough Abdomen:soft, NT, ND, (+)BS Extremities: No clubbing, cyanosis, or edema Skin: R lateral heel DTI seen ~ 1inch diameter     Musculoskeletal:Pain with Right shoulder ROM, dorsum of Right hand edema Skin: R- PRAFO on RLE- using kickstand so stands upright Neurologic:, motor strength is 1/5 in right biceps/triceps o/w 0/5, 4/5 Left  deltoid, bicep, tricep, grip, hip flexor, knee extensors, ankle dorsiflexor and plantar flexor      Assessment/Plan: 1. Functional deficits secondary to Left brainstem vs subcortical infarct which require 3+ hours per day of interdisciplinary therapy in a comprehensive inpatient rehab setting.  Physiatrist is providing close team supervision and 24 hour management of active medical problems listed below.  Physiatrist and rehab team continue to assess barriers to discharge/monitor patient progress toward  functional and medical goals  Care Tool:  Bathing  Bathing activity did not occur: Safety/medical concerns Body parts bathed by patient: Face   Body parts bathed by helper: Right arm, Left arm, Chest, Abdomen, Front perineal area, Buttocks, Right upper leg, Left upper leg, Right lower leg, Left lower leg     Bathing assist Assist Level: 2 Helpers     Upper Body Dressing/Undressing Upper body dressing   What is the patient wearing?: Pull over shirt    Upper body assist Assist Level: Total Assistance - Patient < 25%    Lower Body Dressing/Undressing Lower body dressing      What is the patient wearing?: Pants     Lower body assist Assist for lower body dressing: 2 Helpers     Toileting Toileting Toileting Activity did not occur (Clothing management and hygiene only): N/A (no void or bm)  Toileting assist Assist for toileting: Dependent - Patient 0%(3 helpers)     Transfers Chair/bed transfer  Transfers assist  Chair/bed transfer activity did not occur: Safety/medical concerns  Chair/bed transfer assist level: 2 Helpers     Locomotion Ambulation   Ambulation assist   Ambulation activity did not occur: Safety/medical concerns          Walk 10 feet activity   Assist  Walk 10 feet activity did not occur: Safety/medical concerns        Walk 50 feet activity   Assist Walk 50 feet with 2 turns activity did not occur: Safety/medical concerns         Walk 150 feet activity   Assist Walk 150  feet activity did not occur: Safety/medical concerns         Walk 10 feet on uneven surface  activity   Assist Walk 10 feet on uneven surfaces activity did not occur: Safety/medical concerns         Wheelchair     Assist Will patient use wheelchair at discharge?: Yes Type of Wheelchair: Manual Wheelchair activity did not occur: Safety/medical concerns         Wheelchair 50 feet with 2 turns activity    Assist    Wheelchair 50  feet with 2 turns activity did not occur: Safety/medical concerns       Wheelchair 150 feet activity     Assist  Wheelchair 150 feet activity did not occur: Safety/medical concerns       Blood pressure (!) 119/50, pulse 73, temperature 98 F (36.7 C), resp. rate 17, height 5' 7"  (1.702 m), weight 82.3 kg, SpO2 100 %.    Medical Problem List and Plan: 1.  Rightt side hemiplegia, dysarthria, +/- aphasia secondary to left brain infarction.  MRI not completed due to pacemaker  -CT demonstrates hypoattenuation in left pons which is consistent with current presentation---increased right hp and increased difficulty with oro-pharyngeal control             -patient may shower             Plan is for SNF after CIR              Continue PT, OT, SLP  2.  Antithrombotics: -DVT/anticoagulation: Subcutaneous Lovenox             -antiplatelet therapy: Continue Aspirin 81 mg daily, Plavix 75 mg daily 3. Pain Management: ContinueTylenol as needed, pain is well controlled.  4. Mood with memory deficits.  Aricept 10 mg nightly             -antipsychotic agents: N/A 5. Neuropsych: This patient is NOT capable of making decisions on her own behalf. 6. Skin/Wound Care: Routine skin checks 7. Fluids/Electrolytes/Nutrition: Routine in and outs.  CMP ordered for tomorrow a.m.  3/13- ordered qmonday labs 8.  Hypertension.  Norvasc 2.5 mg daily, Coreg 3.125 mg twice daily.               Monitor with increased mobility Vitals:   12/31/19 0822 12/31/19 1317  BP: (!) 126/50 (!) 119/50  Pulse:  73  Resp:  17  Temp:  98 F (36.7 C)  SpO2:  100%   Controlled 3/12  3/13- BP controlled- [id number] systolic- con't regimen 9.  Diabetes mellitus with hyperglycemia.  Hemoglobin A1c 8.2.  Lantus insulin 10 units twice daily, NovoLog 2 units 3 times daily check blood sugars before meals and at bedtime              CBG (last 3)  Recent Labs    12/31/19 0809 12/31/19 1147 12/31/19 1623  GLUCAP 180* 111*  138*  improved  19U BID lantus  3/13- adequate control- con't meds 10.  CAD with stenting as well as pacemaker.  Continue aspirin and Plavix. 11.  CKD stage IIIb.  Creatinine baseline 1.56.             Normalizing back to baseline  3/13- labs monday  12.  Chronic diastolic congestive heart failure.  Monitor for any signs of fluid overload             Daily weights 13.  OSA.  CPAP. 14.  Hyperlipidemia.  Lipitor 15.  Chronic normocytic anemia.  Continue iron supplement             hgb 10 16.  Post stroke dysphagia: Dysphagia #2 thin liquids.  3/13- was on D2 thin lqiuids, but appears is now NPO- getting TFs by NGT- con't regimen and monitor- will need open to get PEG    Follow-up speech therapy.  Advance as tolerated  3/2: see diet/nutrition.  17.  UTI/Citrobacter/EColi:  Resolved    19. Hypercalcemia- ? Etiology, nephro considering neoplasm related, + rectal wall swelling , GI to perform flex sig on Monday  20.  Severe Dysphagia, NPO-      Disposition: Appreciate Dorien Chihuahua RN, SW discussed patient care conference with family yesterday and family expressed understanding.    Proctitis with diarrhea, discussed with GI, needs flex sig with biopsy, per Neuro Dr Erlinda Hong ok to stop plavix resume after procedures and cont Baby ASA Resume plavix G tube, ask Gen surg to eval  21. R heel DTI  3/13- might have been blister initially but now DTI- pic as above- will cover for standing activities and wear PRAFO when not standing otherwise.   LOS: 17 days A FACE TO FACE EVALUATION WAS PERFORMED  Cullen Lahaie 12/31/2019, 6:25 PM

## 2020-01-01 ENCOUNTER — Encounter: Payer: Self-pay | Admitting: *Deleted

## 2020-01-01 ENCOUNTER — Inpatient Hospital Stay (HOSPITAL_COMMUNITY): Payer: Medicare Other | Admitting: Speech Pathology

## 2020-01-01 LAB — GLUCOSE, CAPILLARY
Glucose-Capillary: 135 mg/dL — ABNORMAL HIGH (ref 70–99)
Glucose-Capillary: 150 mg/dL — ABNORMAL HIGH (ref 70–99)
Glucose-Capillary: 162 mg/dL — ABNORMAL HIGH (ref 70–99)
Glucose-Capillary: 196 mg/dL — ABNORMAL HIGH (ref 70–99)
Glucose-Capillary: 142 mg/dL — ABNORMAL HIGH (ref 70–99)
Glucose-Capillary: 123 mg/dL — ABNORMAL HIGH (ref 70–99)

## 2020-01-01 NOTE — Progress Notes (Signed)
Speech Language Pathology Daily Session Note  Patient Details  Name: [name] MRN: [id number] Date of Birth: 10-Jan-1928  Today's Date: 01/01/2020 SLP Individual Time: 3094-0768 SLP Individual Time Calculation (min): 57 min  Short Term Goals: Week 3: SLP Short Term Goal 1 (Week 3): Pt will consume therapeutic trials of ice chips and/or thin liquids with efficient oral manipulation of bolus and timely swallow initiation with Max A multimodal cues. SLP Short Term Goal 2 (Week 3): Pt will increase speech intelligibility to 60% at the word level with Max A multimodal cues for use of increased vocal intensity. SLP Short Term Goal 3 (Week 3): Pt will sustain attentoin to functional tasks for 10 minute intervals with Mod A verbal/visual cues. SLP Short Term Goal 4 (Week 3): Pt will demonstrate problem solving during basic faimliar tasks with Mod A verbal/visual cues.  Skilled Therapeutic Interventions: Pt was seen for skilled ST targeting dysphagia and cognitive goals. SLP facilitated session with trial of puree X1 and ice chip X1, both requiring suctioning as pt demonstrated no bolus awareness or attempt to manipulate orally. Pt also required Max A multimodal assistance to manage thin secretions that spill anteriorly out of oral cavity throughout session, and to attempt to maintain a closed resting oral posture (as opposed to open). Pt completed effortful swallowing exercise to target pharyngeal strengthening and volitional swallow trigger X6 with Max A demonstrative and verbal cues, however unable to continue to following commands to trigger volitional swallow after 6 repetitions (upon observation and palpation of hyoid).. Recommend continue NPO. Mod A verbal and visual cues were required for arousal and sustained attention to informal function and structured tasks during session. Although intitially hand over hand assistance was required to complete a basic familiar cleaning task, assistance faded  to Min A verbal cues for problem solving when washing and drying. Pt also matched colored cards (field of 4) with 100% accuracy when provided Mod A verbal cues for problem solving. Her speech was slightly more intelligible today with Max A verbal cues for use of increased vocal intensity - ~65%. Pt left semi-reclined in bed with alarm set and needs within reach. Continue per current plan of care.        Pain Pain Assessment Pain Scale: Faces Faces Pain Scale: No hurt  Therapy/Group: Individual Therapy  Arbutus Leas 01/01/2020, 9:41 AM

## 2020-01-01 NOTE — Progress Notes (Signed)
Spoke with pt about wearing CPAP tonight. Pt seemed unsure if she wanted to try it or not. I told her to call if she wants to wear later.

## 2020-01-01 NOTE — Progress Notes (Signed)
[facility] PHYSICAL MEDICINE & REHABILITATION PROGRESS NOTE   Subjective/Complaints:   Pt reports she's OK, but then fell asleep- staff wondering if there are any meds to wake pt up.   Also pt NOT VOIDING at all- requiring in/out caths- wondering the plan for bladder and feeding.     ROS: asleep- said "idon't know" when asked/woken up and feel back asleep.    Objective:   No results found. No results for input(s): WBC, HGB, HCT, PLT in the last 72 hours. No results for input(s): NA, K, CL, CO2, GLUCOSE, BUN, CREATININE, CALCIUM in the last 72 hours.  Intake/Output Summary (Last 24 hours) at 01/01/2020 1358 Last data filed at 01/01/2020 1300 Gross per 24 hour  Intake 10 ml  Output 1200 ml  Net -1190 ml     Physical Exam: Vital Signs Blood pressure (!) 129/54, pulse 70, temperature 97.8 F (36.6 C), resp. rate 17, height 5' 7"  (1.702 m), weight 82.3 kg, SpO2 98 %.   Constitutional:sitting up in bed; asleep with mouth open, woke for short period, spoke and went back to sleep, nursing and SLP at bedside, NAD General: No acute distress HEENT: facial droop noted; NGT in nare Heart: RRR no JVD Lungs: slightly coarse throughout- but good air movement Abdomen:soft, NT, ND, (+)BS Extremities: No clubbing, cyanosis, or edema Skin: R lateral heel DTI seen ~ 1inch diameter     Musculoskeletal:Pain with Right shoulder ROM, dorsum of Right hand edema Skin: R- PRAFO on RLE- using kickstand so stands upright Neurologic:, motor strength is 1/5 in right biceps/triceps o/w 0/5, 4/5 Left  deltoid, bicep, tricep, grip, hip flexor, knee extensors, ankle dorsiflexor and plantar flexor      Assessment/Plan: 1. Functional deficits secondary to Left brainstem vs subcortical infarct which require 3+ hours per day of interdisciplinary therapy in a comprehensive inpatient rehab setting.  Physiatrist is providing close team supervision and 24 hour management of active medical problems  listed below.  Physiatrist and rehab team continue to assess barriers to discharge/monitor patient progress toward functional and medical goals  Care Tool:  Bathing  Bathing activity did not occur: Safety/medical concerns Body parts bathed by patient: Face   Body parts bathed by helper: Right arm, Left arm, Chest, Abdomen, Front perineal area, Buttocks, Right upper leg, Left upper leg, Right lower leg, Left lower leg     Bathing assist Assist Level: 2 Helpers     Upper Body Dressing/Undressing Upper body dressing   What is the patient wearing?: Pull over shirt    Upper body assist Assist Level: Total Assistance - Patient < 25%    Lower Body Dressing/Undressing Lower body dressing      What is the patient wearing?: Pants     Lower body assist Assist for lower body dressing: 2 Helpers     Toileting Toileting Toileting Activity did not occur (Clothing management and hygiene only): N/A (no void or bm)  Toileting assist Assist for toileting: Dependent - Patient 0%(3 helpers)     Transfers Chair/bed transfer  Transfers assist  Chair/bed transfer activity did not occur: Safety/medical concerns  Chair/bed transfer assist level: 2 Helpers     Locomotion Ambulation   Ambulation assist   Ambulation activity did not occur: Safety/medical concerns          Walk 10 feet activity   Assist  Walk 10 feet activity did not occur: Safety/medical concerns        Walk 50 feet activity   Assist Walk 50 feet  with 2 turns activity did not occur: Safety/medical concerns         Walk 150 feet activity   Assist Walk 150 feet activity did not occur: Safety/medical concerns         Walk 10 feet on uneven surface  activity   Assist Walk 10 feet on uneven surfaces activity did not occur: Safety/medical concerns         Wheelchair     Assist Will patient use wheelchair at discharge?: Yes Type of Wheelchair: Manual Wheelchair activity did not  occur: Safety/medical concerns         Wheelchair 50 feet with 2 turns activity    Assist    Wheelchair 50 feet with 2 turns activity did not occur: Safety/medical concerns       Wheelchair 150 feet activity     Assist  Wheelchair 150 feet activity did not occur: Safety/medical concerns       Blood pressure (!) 129/54, pulse 70, temperature 97.8 F (36.6 C), resp. rate 17, height 5' 7"  (1.702 m), weight 82.3 kg, SpO2 98 %.    Medical Problem List and Plan: 1.  Rightt side hemiplegia, dysarthria, +/- aphasia secondary to left brain infarction.  MRI not completed due to pacemaker  -CT demonstrates hypoattenuation in left pons which is consistent with current presentation---increased right hp and increased difficulty with oro-pharyngeal control             -patient may shower             Plan is for SNF after CIR              Continue PT, OT, SLP  2.  Antithrombotics: -DVT/anticoagulation: Subcutaneous Lovenox             -antiplatelet therapy: Continue Aspirin 81 mg daily, Plavix 75 mg daily 3. Pain Management: ContinueTylenol as needed, pain is well controlled.  4. Mood with memory deficits.  Aricept 10 mg nightly             -antipsychotic agents: N/A 5. Neuropsych: This patient is NOT capable of making decisions on her own behalf. 6. Skin/Wound Care: Routine skin checks 7. Fluids/Electrolytes/Nutrition: Routine in and outs.  CMP ordered for tomorrow a.m.  3/13- ordered qmonday labs 8.  Hypertension.  Norvasc 2.5 mg daily, Coreg 3.125 mg twice daily.               Monitor with increased mobility Vitals:   01/01/20 0843 01/01/20 1322  BP: (!) 147/64 (!) 129/54  Pulse: 74 70  Resp:  17  Temp:  97.8 F (36.6 C)  SpO2:  98%   Controlled 3/12  3/13- BP controlled- [id number] systolic- con't regimen  3/50- BP [id number] systolic- con't meds 9.  Diabetes mellitus with hyperglycemia.  Hemoglobin A1c 8.2.  Lantus insulin 10 units twice daily, NovoLog 2 units 3 times  daily check blood sugars before meals and at bedtime              CBG (last 3)  Recent Labs    01/01/20 0518 01/01/20 0822 01/01/20 1135  GLUCAP 150* 162* 196*  improved  19U BID lantus  3/13- adequate control- con't meds  3/14- slightly variable control, but still 100-200- con't regimen for now 10.  CAD with stenting as well as pacemaker.  Continue aspirin and Plavix. 11.  CKD stage IIIb.  Creatinine baseline 1.56.             Normalizing back to baseline  3/13- labs monday  12.  Chronic diastolic congestive heart failure.  Monitor for any signs of fluid overload             Daily weights   Filed Weights   12/30/19 0446 12/30/19 1429 12/31/19 0329  Weight: 80.1 kg 80.1 kg 82.3 kg   3/14- weight overall stable, however no weight today- will ask nursing.  13.  OSA.  CPAP. 14.  Hyperlipidemia.  Lipitor 15.  Chronic normocytic anemia.  Continue iron supplement             hgb 10 16.  Post stroke dysphagia: Dysphagia #2 thin liquids.  3/13- was on D2 thin lqiuids, but appears is now NPO- getting TFs by NGT- con't regimen and monitor- will need open to get PEG    Follow-up speech therapy.  Advance as tolerated  3/2: see diet/nutrition.  17.  UTI/Citrobacter/EColi:  Resolved    19. Hypercalcemia- ? Etiology, nephro considering neoplasm related, + rectal wall swelling , GI to perform flex sig on Monday  20.  Severe Dysphagia, NPO-      Disposition: Appreciate Dorien Chihuahua RN, SW discussed patient care conference with family yesterday and family expressed understanding.    Proctitis with diarrhea, discussed with GI, needs flex sig with biopsy, per Neuro Dr Erlinda Hong ok to stop plavix resume after procedures and cont Baby ASA Resume plavix G tube, ask Gen surg to eval  21. R heel DTI  3/13- might have been blister initially but now DTI- pic as above- will cover for standing activities and wear PRAFO when not standing otherwise.  22. Sedation  3/14- per multiple staff members, pt is so  sedated, cannot get her to participate in therapy more than a few minutes at a time/cooperate with nursing the same amount- suggest ritalin if hasn't been tried.  23. Urinary retention  3/14- not sure if this is new, however has been x1+ weeks per nursing- no voiding at all- suggest Flomax for seeing if can get pt to void- will make recs for primary team.   LOS: 18 days A FACE TO FACE EVALUATION WAS PERFORMED  Aric Jost 01/01/2020, 1:58 PM

## 2020-01-02 ENCOUNTER — Inpatient Hospital Stay (HOSPITAL_COMMUNITY): Payer: Medicare Other | Admitting: Speech Pathology

## 2020-01-02 ENCOUNTER — Inpatient Hospital Stay (HOSPITAL_COMMUNITY): Payer: Medicare Other | Admitting: Occupational Therapy

## 2020-01-02 ENCOUNTER — Inpatient Hospital Stay (HOSPITAL_COMMUNITY): Payer: Medicare Other | Admitting: Physical Therapy

## 2020-01-02 LAB — BASIC METABOLIC PANEL
Anion gap: 11 (ref 5–15)
BUN: 20 mg/dL (ref 8–23)
CO2: 28 mmol/L (ref 22–32)
Calcium: 8.3 mg/dL — ABNORMAL LOW (ref 8.9–10.3)
Chloride: 102 mmol/L (ref 98–111)
Creatinine, Ser: 1.07 mg/dL — ABNORMAL HIGH (ref 0.44–1.00)
GFR calc Af Amer: 53 mL/min — ABNORMAL LOW (ref >60)
GFR calc non Af Amer: 46 mL/min — ABNORMAL LOW (ref >60)
Glucose, Bld: 152 mg/dL — ABNORMAL HIGH (ref 70–99)
Potassium: 3.6 mmol/L (ref 3.5–5.1)
Sodium: 141 mmol/L (ref 135–145)

## 2020-01-02 LAB — GLUCOSE, CAPILLARY
Glucose-Capillary: 130 mg/dL — ABNORMAL HIGH (ref 70–99)
Glucose-Capillary: 144 mg/dL — ABNORMAL HIGH (ref 70–99)
Glucose-Capillary: 144 mg/dL — ABNORMAL HIGH (ref 70–99)
Glucose-Capillary: 157 mg/dL — ABNORMAL HIGH (ref 70–99)
Glucose-Capillary: 163 mg/dL — ABNORMAL HIGH (ref 70–99)
Glucose-Capillary: 198 mg/dL — ABNORMAL HIGH (ref 70–99)

## 2020-01-02 LAB — CBC WITH DIFFERENTIAL/PLATELET
Abs Immature Granulocytes: 0.02 10*3/uL (ref 0.00–0.07)
Basophils Absolute: 0.1 10*3/uL (ref 0.0–0.1)
Basophils Relative: 1 %
Eosinophils Absolute: 0.2 10*3/uL (ref 0.0–0.5)
Eosinophils Relative: 2 %
HCT: 28 % — ABNORMAL LOW (ref 36.0–46.0)
Hemoglobin: 8.6 g/dL — ABNORMAL LOW (ref 12.0–15.0)
Immature Granulocytes: 0 %
Lymphocytes Relative: 29 %
Lymphs Abs: 2.5 10*3/uL (ref 0.7–4.0)
MCH: 28.8 pg (ref 26.0–34.0)
MCHC: 30.7 g/dL (ref 30.0–36.0)
MCV: 93.6 fL (ref 80.0–100.0)
Monocytes Absolute: 0.8 10*3/uL (ref 0.1–1.0)
Monocytes Relative: 10 %
Neutro Abs: 4.9 10*3/uL (ref 1.7–7.7)
Neutrophils Relative %: 58 %
Platelets: 258 10*3/uL (ref 150–400)
RBC: 2.99 MIL/uL — ABNORMAL LOW (ref 3.87–5.11)
RDW: 12.7 % (ref 11.5–15.5)
WBC: 8.4 10*3/uL (ref 4.0–10.5)
nRBC: 0 % (ref 0.0–0.2)

## 2020-01-02 MED ORDER — TAMSULOSIN HCL 0.4 MG PO CAPS
0.4000 mg | ORAL_CAPSULE | Freq: Every day | ORAL | Status: DC
  Filled 2020-01-02: qty 1

## 2020-01-02 MED ORDER — CEFAZOLIN SODIUM-DEXTROSE 2-4 GM/100ML-% IV SOLN
2.0000 g | Freq: Once | INTRAVENOUS | Status: AC
  Administered 2020-01-03: 2 g via INTRAVENOUS
  Filled 2020-01-02: qty 100

## 2020-01-02 MED ORDER — METHYLPHENIDATE HCL 5 MG PO TABS
5.0000 mg | ORAL_TABLET | Freq: Two times a day (BID) | ORAL | Status: DC
  Administered 2020-01-02 – 2020-01-05 (×5): 5 mg via ORAL
  Filled 2020-01-02 (×5): qty 1

## 2020-01-02 MED ORDER — BETHANECHOL CHLORIDE 10 MG PO TABS
10.0000 mg | ORAL_TABLET | Freq: Three times a day (TID) | ORAL | Status: DC
  Administered 2020-01-02 (×2): 10 mg
  Filled 2020-01-02 (×2): qty 1

## 2020-01-02 NOTE — Progress Notes (Signed)
Barium completed per instruction by Ng tube. Patient tolerated well

## 2020-01-02 NOTE — Consult Note (Signed)
Chief Complaint: Patient was seen in consultation today for dysphagia/percutaneous gastrostomy tube placement.  Referring Physician(s): Kirsteins, Elon Spanner  Supervising Physician: Arne Cleveland  Patient Status: CIR- inpt  History of Present Illness: [name] is a 86 y.o. female with a past medical history of hypertension, hypercholesterolemia, ischemic cardiomyopathy, CAD, HF, complete heart block s/p pacemaker, CVA, GERD, CKD stage III, diabetes mellitus, anemia, OSA, glaucoma, and arthritis. She was recently admitted to [facility] 12/06/2019 to 12/14/2019 for management of right-sided hemiparesis and multiple medical problems (CKD stage III, hypertension, diabetes, anemia). During admission, it was recommended that patient be discharged to CIR to improve functional status- patient has been admitted to [facility] since 12/14/2019. While in CIR, patient has been evaluated by SLP who recommends patient remain NPO.  IR requested by Dr. Letta Pate and Lauraine Rinne, PA-C for possible image-guided percutaneous gastrostomy tube placement. Patient laying in bed resting comfortably. She opens eyes to voice and quickly falls back asleep- history difficult to obtain secondary to this.    Last dose Plavix 12/29/2019, last dose Lovenox this AM at 0816.   Past Medical History:  Diagnosis Date  . Anemia   . Arthritis   . Celiac disease   . Chronic systolic CHF (congestive heart failure) ([place])   . Complete heart block ([place])   . Coronary artery disease   . Diabetes mellitus    INSULIN DEPENDENT  . GERD (gastroesophageal reflux disease)   . Glaucoma   . Headache(784.0)   . Heart disease   . Hypercholesterolemia   . Hypertension   . Iron deficiency anemia, unspecified   . Ischemic cardiomyopathy   . Memory difficulties 04/14/2019  . Shortness of breath   . Sleep apnea    uses cpap  . Stroke ([place])   . Unspecified constipation     Past Surgical History:  Procedure  Laterality Date  . BACK SURGERY    . BI-VENTRICULAR PACEMAKER INSERTION (CRT-P)  March 2014   [facility]  . BIV PACEMAKER GENERATOR CHANGEOUT N/A 06/29/2019   Procedure: BIV PACEMAKER GENERATOR CHANGEOUT;  Surgeon: Evans Lance, MD;  Location: [place] CV LAB;  Service: Cardiovascular;  Laterality: N/A;  . CARDIAC CATHETERIZATION  09/02/2012  . CARDIAC SURGERY    . CORONARY ANGIOPLASTY WITH STENT PLACEMENT  09/02/2012   RCA  . FLEXIBLE SIGMOIDOSCOPY N/A 12/30/2019   Procedure: FLEXIBLE SIGMOIDOSCOPY;  Surgeon: Carol Ada, MD;  Location: [place];  Service: Endoscopy;  Laterality: N/A;  . PERCUTANEOUS CORONARY STENT INTERVENTION (PCI-S) N/A 09/02/2012   Procedure: PERCUTANEOUS CORONARY STENT INTERVENTION (PCI-S);  Surgeon: Clent Demark, MD;  Location: [facility] CATH LAB;  Service: Cardiovascular;  Laterality: N/A;  . PERMANENT PACEMAKER INSERTION N/A 11/29/2012   Procedure: PERMANENT PACEMAKER INSERTION;  Surgeon: Deboraha Sprang, MD;  Location: [facility] CATH LAB;  Service: Cardiovascular;  Laterality: N/A;  . TEMPORARY PACEMAKER INSERTION N/A 11/28/2012   Procedure: TEMPORARY PACEMAKER INSERTION;  Surgeon: Clent Demark, MD;  Location: [place] CATH LAB;  Service: Cardiovascular;  Laterality: N/A;    Allergies: Patient has no known allergies.  Medications: Prior to Admission medications   Medication Sig Start Date End Date Taking? Authorizing Provider  amLODipine (NORVASC) 2.5 MG tablet Take 2.5 mg by mouth every morning.    [provider]  aspirin EC 81 MG EC tablet Take 1 tablet (81 mg total) by mouth daily. 12/15/19   Charlynne Cousins, MD  atorvastatin (LIPITOR) 80 MG tablet Take 80 mg by mouth every  morning.  04/17/15   [provider]  brimonidine-timolol (COMBIGAN) 0.2-0.5 % ophthalmic solution Place 1 drop into both eyes 2 (two) times daily.    [provider]  calcitRIOL (ROCALTROL) 0.25 MCG capsule Take 0.25 mcg by mouth every morning.    [provider]  carvedilol (COREG) 3.125 MG tablet Take 3.125 mg by mouth 2 (two) times daily. 04/17/15   [provider]  clopidogrel (PLAVIX) 75 MG tablet Take 1 tablet (75 mg total) by mouth daily. 07/03/19   Thurnell Lose, MD  Cod Liver Oil CAPS Take 1 capsule by mouth at bedtime.     [provider]  donepezil (ARICEPT) 10 MG tablet Take 10 mg by mouth at bedtime.  11/07/19   [provider]  dorzolamide (TRUSOPT) 2 % ophthalmic solution Place 1 drop into both eyes 2 (two) times daily.  07/28/13   [provider]  ferrous sulfate 325 (65 FE) MG tablet Take 325 mg by mouth daily with breakfast.    [provider]  insulin degludec (TRESIBA FLEXTOUCH) 100 UNIT/ML SOPN FlexTouch Pen Inject 10-20 Units into the skin See admin instructions. Inject 10-20 units into the skin at bedtime, based upon CGB (home capillary blood glucose)    [provider]  Insulin Lispro (HUMALOG KWIKPEN) 200 UNIT/ML SOPN Inject 6 Units into the skin 3 (three) times daily as needed (high blood sugar).     [provider]  latanoprost (XALATAN) 0.005 % ophthalmic solution Appointment OVERDUE, place 1 drop into both eyes daily at bedtime Patient taking differently: Place 1 drop into both eyes at bedtime.  03/02/14   Blanchie Serve, MD  linagliptin (TRADJENTA) 5 MG TABS tablet Take 5 mg by mouth every morning.    [provider]  loperamide (IMODIUM) 2 MG capsule Take 2 mg by mouth as needed for diarrhea or loose stools.    [provider]  Multiple Vitamin (MULTIVITAMIN WITH MINERALS) TABS Take 1 tablet by mouth at bedtime.     [provider]  Multiple Vitamins-Minerals (EYE VITAMINS) CAPS Take 1 capsule by mouth every morning.    [provider]  nitroGLYCERIN (NITROSTAT) 0.4 MG SL tablet Place 0.4 mg under the tongue every 5 (five) minutes as needed for chest pain.  09/03/12   Charolette Forward, MD  NOVOFINE 32G X 6 MM MISC   01/25/13   [provider]  pantoprazole (PROTONIX) 40 MG tablet Take 40 mg by mouth daily before breakfast.  05/07/15   [provider]  valsartan-hydrochlorothiazide (DIOVAN-HCT) 320-12.5 MG tablet Take 1 tablet by mouth every morning.     [provider]     Family History  Problem Relation Age of Onset  . Diabetes Mother   . Hypertension Mother   . Hyperlipidemia Mother   . Cancer Sister   . Dementia Sister   . Neuropathy Sister     Social History   Socioeconomic History  . Marital status: Widowed    Spouse name: Not on file  . Number of children: 4  . Years of education: 49  . Highest education level: Not on file  Occupational History  . Occupation: Retired  Tobacco Use  . Smoking status: Former Smoker    Quit date: 12/26/1976    Years since quitting: 43.0  . Smokeless tobacco: Never Used  Substance and Sexual Activity  . Alcohol use: No    Alcohol/week: 0.0 standard drinks  . Drug use: No  . Sexual activity: Never  Other Topics Concern  . Not on file  Social History Narrative   Regular exercise-no   Caffeine Use-yes   Patient lives at home at home with [name] her daughter.   Retired   Right handed   Education 12th   Caffeine one cup of coffee daily   Social Determinants of Radio broadcast assistant Strain:   . Difficulty of Paying Living Expenses:   Food Insecurity:   . Worried About Charity fundraiser in the Last Year:   . Arboriculturist in the Last Year:   Transportation Needs:   . Film/video editor (Medical):   Marland Kitchen Lack of Transportation (Non-Medical):   Physical Activity:   . Days of Exercise per Week:   . Minutes of Exercise per Session:   Stress:   . Feeling of Stress :   Social Connections:   . Frequency of Communication with Friends and Family:   . Frequency of Social Gatherings with Friends and Family:   . Attends Religious Services:   . Active Member of Clubs or Organizations:   . Attends Theatre manager Meetings:   Marland Kitchen Marital Status:      Review of Systems: A 12 point ROS discussed and pertinent positives are indicated in the HPI above.  All other systems are negative.  Review of Systems  Vital Signs: BP (!) 155/61 (BP Location: Right Arm)   Pulse 70   Temp 98.4 F (36.9 C)   Resp 19   Ht 5' 7"  (1.702 m)   Wt 179 lb 7.3 oz (81.4 kg)   SpO2 98%   BMI 28.11 kg/m   Physical Exam   MD Evaluation Airway: WNL Heart: WNL Abdomen: WNL Chest/ Lungs: WNL ASA  Classification: 3 Mallampati/Airway Score: Two   Imaging: CT ABDOMEN PELVIS WO CONTRAST  Result Date: 12/29/2019 CLINICAL DATA:  86 year old with dysphagia. Evaluate anatomy for possible percutaneous gastrostomy tube placement. EXAM: CT ABDOMEN AND PELVIS WITHOUT CONTRAST TECHNIQUE: Multidetector CT imaging of the abdomen and pelvis was performed following the standard protocol without IV contrast. COMPARISON:  CT abdomen 10/17/2012 FINDINGS: Lower chest: Motion artifact at the lung bases without significant airspace disease or consolidation. Small focal density in medial left lower lobe probably represents conglomeration of structures and there may be a small amount of atelectasis in this area. No large pleural effusions. Cardiac pacer wires with post CABG changes. Hepatobiliary: Cholelithiasis. No gallbladder distension. Normal appearance of the liver. Pancreas: Unremarkable. No pancreatic ductal dilatation or surrounding inflammatory changes. Spleen: Normal appearance of the spleen. Adrenals/Urinary Tract: Normal adrenal glands. Negative for kidney stones. Negative for hydronephrosis. No suspicious renal lesions. Urinary bladder is unremarkable. Stomach/Bowel: Nasogastric tube terminates in the distal stomach near the pylorus and duodenal bulb. Normal appearance of the duodenum. There is transverse colon anterior to the stomach body. Evidence for extensive wall thickening involving the rectum. Concern for focal fluid or  edema within the left side of the rectal wall, seen on sequence 3, image 73. There is also extensive edema in the perirectal region and presacral region.No evidence for a bowel obstruction. Vascular/Lymphatic: Abdominal aorta is heavily calcified without aneurysm. No abdominopelvic lymphadenopathy. Reproductive: Numerous calcified fibroids throughout the uterus. Largest fibroid is along the posterior aspect of the uterus and measures up to 5.0 cm. There are also prominent calcifications in the region of the left adnexa and left ovary. Other: Significant edema in the perirectal region and presacral space. Negative for ascites. Negative for free air. Small  amount of subcutaneous gas in the left anterior abdomen likely associated with an injection sites. Musculoskeletal: Posterior disc space narrowing at L3-L4. Mild disc space narrowing with endplate irregularity at L4-L5. Facet arthropathy in the lower lumbar spine. Disc space narrowing and endplate irregularity at T10-T11. IMPRESSION: 1. Wall thickening and inflammatory changes involving the rectum. In particular, there appears to be focal thickening or fluid along the left side of the rectum. Findings are suggestive for proctitis. Consider GI consultation. 2. There is transverse colon intervening between the anterior abdominal wall and the stomach. Patient may not be a candidate for percutaneous gastrostomy tube placement due to this anatomy and would recommend visualization of the transverse colon for an attempted percutaneous gastrostomy tube placement. 3. Multiple calcified uterine fibroids. 4. Cholelithiasis.  No evidence for gallbladder inflammation. Electronically Signed   By: Markus Daft M.D.   On: 12/29/2019 07:59   DG Chest 2 View  Result Date: 12/15/2019 CLINICAL DATA:  Shortness of breath. EXAM: CHEST - 2 VIEW COMPARISON:  Chest x-ray 12/07/2019. FINDINGS: AICD in stable position. Prior CABG. Cardiomegaly. Low lung volumes. Very mild bilateral  interstitial prominence noted. Mild interstitial edema and/or pneumonitis cannot be excluded. No focal infiltrate. No pleural effusion or pneumothorax. Degenerative changes thoracic spine and both shoulders. IMPRESSION: 1.  AICD in stable position.  Prior CABG. 2. Low lung volumes. Very mild bilateral interstitial prominence noted. Mild interstitial edema and/or pneumonitis cannot be excluded. Electronically Signed   By: Marcello Moores  Register   On: 12/15/2019 14:01   DG Chest 2 View  Result Date: 12/07/2019 CLINICAL DATA:  Frequent falls. EXAM: CHEST - 2 VIEW COMPARISON:  08/07/2015 FINDINGS: Moderate thoracic spondylosis. Pacer with leads at right atrium and right ventricle. Patient rotated right on the frontal. Normal heart size. No pleural effusion or pneumothorax. Clear lungs. IMPRESSION: No acute cardiopulmonary disease. Electronically Signed   By: Abigail Miyamoto M.D.   On: 12/07/2019 09:40   DG Knee 1-2 Views Left  Result Date: 12/07/2019 CLINICAL DATA:  Knee pain, bilateral EXAM: LEFT KNEE - 1-2 VIEW COMPARISON:  10/16/2012 FINDINGS: Tricompartmental osteoarthritic change with complete loss of the joint space in the medial compartment. No signs of acute fracture. Small joint effusion. IMPRESSION: Tricompartmental osteoarthritic change greatest in medial compartment with small joint effusion. Electronically Signed   By: Zetta Bills M.D.   On: 12/07/2019 20:10   DG Knee 1-2 Views Right  Result Date: 12/07/2019 CLINICAL DATA:  Bilateral knee plain select plain pain EXAM: RIGHT KNEE - 1-2 VIEW COMPARISON:  October 16, 2012 FINDINGS: Marked tricompartmental osteoarthritic change greatest in medial compartment with complete loss of the joint space and marginal osteophytes, worse than in 2013. Osteopenia. No acute fracture. Postoperative changes about the medial left thigh and posterior lower extremity may reflect changes of previous graft harvest. IMPRESSION: 1. Marked tricompartmental osteoarthritic  change greatest in the medial compartment of the right knee, worse than in 2013. 2. Osteopenia. Electronically Signed   By: Zetta Bills M.D.   On: 12/07/2019 20:08   CT HEAD WO CONTRAST  Addendum Date: 12/15/2019   ADDENDUM REPORT: 12/15/2019 16:10 ADDENDUM: These results were called by telephone at the time of interpretation on 12/15/2019 at 4:10 pm to provider Lauraine Rinne , who verbally acknowledged these results. Electronically Signed   By: Constance Holster M.D.   On: 12/15/2019 16:10   Result Date: 12/15/2019 CLINICAL DATA:  Stroke follow-up. EXAM: CT HEAD WITHOUT CONTRAST TECHNIQUE: Contiguous axial images were obtained from the base of  the skull through the vertex without intravenous contrast. COMPARISON:  12/08/2019. FINDINGS: Brain: No evidence of acute infarction, hemorrhage, hydrocephalus, extra-axial collection or mass lesion/mass effect. Atrophy and chronic microvascular ischemic changes are noted. There is a new hypoattenuating area in the left pons. Vascular: No hyperdense vessel or unexpected calcification. Skull: Normal. Negative for fracture or focal lesion. Sinuses/Orbits: No acute finding. Other: None. IMPRESSION: 1. New hypoattenuating area within the left pons is concerning for a developing infarct. Artifact in this region can have a very similar appearance. Further evaluation with MRI is recommended. 2. No acute intracranial hemorrhage. No mass effect or midline shift. Chronic findings as detailed above. Electronically Signed: By: Constance Holster M.D. On: 12/15/2019 15:59   CT HEAD WO CONTRAST  Result Date: 12/08/2019 CLINICAL DATA:  Neuro deficit, stroke suspected EXAM: CT HEAD WITHOUT CONTRAST TECHNIQUE: Contiguous axial images were obtained from the base of the skull through the vertex without intravenous contrast. COMPARISON:  12/07/2019 FINDINGS: Brain: There is atrophy and chronic small vessel disease changes. No acute intracranial abnormality. Specifically, no  hemorrhage, hydrocephalus, mass lesion, acute infarction, or significant intracranial injury. Vascular: No hyperdense vessel or unexpected calcification. Skull: No acute calvarial abnormality. Sinuses/Orbits: Bilateral ocular implants again noted, stable. No acute findings. Other: None IMPRESSION: Atrophy, chronic microvascular disease. No acute intracranial abnormality. Electronically Signed   By: Rolm Baptise M.D.   On: 12/08/2019 10:19   CT HEAD WO CONTRAST  Result Date: 12/07/2019 CLINICAL DATA:  Stroke follow-up EXAM: CT HEAD WITHOUT CONTRAST TECHNIQUE: Contiguous axial images were obtained from the base of the skull through the vertex without intravenous contrast. COMPARISON:  Head CT December 06, 2019 FINDINGS: Brain: No evidence of acute infarction, hemorrhage, hydrocephalus, extra-axial collection or mass lesion/mass effect. Confluent hypodensity in the periventricular white matter, nonspecific, most likely related to chronic small vessel ischemia, unchanged. Prominence of the supratentorial ventricles and cerebral sulci reflecting parenchymal volume loss. Vascular: No hyperdense vessel or unexpected calcification. Skull: Normal. Negative for fracture or focal lesion. Sinuses/Orbits: Bilateral ocular implants (GDD). Other: None. IMPRESSION: No acute intracranial abnormality. No significant change from prior. Electronically Signed   By: Pedro Earls M.D.   On: 12/07/2019 15:00   CT HEAD WO CONTRAST  Result Date: 12/06/2019 CLINICAL DATA:  Possible stroke EXAM: CT HEAD WITHOUT CONTRAST TECHNIQUE: Contiguous axial images were obtained from the base of the skull through the vertex without intravenous contrast. COMPARISON:  CT head 11/07/2019 FINDINGS: Brain: Moderate atrophy. Mild chronic microvascular ischemic changes in the white matter. Negative for acute infarct, hemorrhage, mass.  No midline shift. Vascular: Negative for hyperdense vessel. Atherosclerotic calcification. Skull:  Negative Sinuses/Orbits: Paranasal sinuses show mild mucosal edema. Bilateral ocular surgery Other: None IMPRESSION: Atrophy and chronic microvascular ischemia. No acute intracranial abnormality. Electronically Signed   By: Franchot Gallo M.D.   On: 12/06/2019 19:10   DG Chest Port 1 View  Result Date: 12/11/2019 CLINICAL DATA:  86 year old female with history of trauma from a fall. EXAM: PORTABLE CHEST 1 VIEW COMPARISON:  Chest x-ray 12/07/2019. FINDINGS: Lung volumes are low. No consolidative airspace disease. No pleural effusions. No pneumothorax. No pulmonary nodule or mass noted. Pulmonary vasculature and the cardiomediastinal silhouette are within normal limits. Aortic atherosclerosis. Status post median sternotomy for CABG. Left-sided pacemaker device in place with lead tips projecting over the expected location of the right atrium and right ventricle. IMPRESSION: 1. Low lung volumes without radiographic evidence of acute cardiopulmonary disease. 2. Aortic atherosclerosis. 3. Postoperative changes and support  apparatus, as above. Electronically Signed   By: Vinnie Langton M.D.   On: 12/11/2019 17:22   DG Abd Portable 1V  Result Date: 12/19/2019 CLINICAL DATA:  86 year old female status post NG placement. EXAM: PORTABLE ABDOMEN - 1 VIEW COMPARISON:  Earlier radiograph dated 12/19/2019. FINDINGS: Partially visualized enteric tube with side-port in the proximal stomach and tip in the region of the gastric fundus. No additional interval change. IMPRESSION: Enteric tube with tip in the gastric fundus. Electronically Signed   By: Anner Crete M.D.   On: 12/19/2019 16:09   DG Abd Portable 1V  Result Date: 12/19/2019 CLINICAL DATA:  NG tube placement. EXAM: PORTABLE ABDOMEN - 1 VIEW COMPARISON:  12/13/2019 FINDINGS: There is no visible NG tube in the lower chest or in the abdomen.Bowel gas pattern is normal. Lung bases are clear. No acute bone abnormality. IMPRESSION: No visible NG tube in the lower  chest or abdomen. Electronically Signed   By: Lorriane Shire M.D.   On: 12/19/2019 13:43   DG Abd Portable 1V  Result Date: 12/13/2019 CLINICAL DATA:  Nausea, vomiting. EXAM: PORTABLE ABDOMEN - 1 VIEW COMPARISON:  None. FINDINGS: The bowel gas pattern is normal. Probable multiple calcified uterine fibroids are noted. IMPRESSION: No evidence of bowel obstruction or ileus. Electronically Signed   By: Marijo Conception M.D.   On: 12/13/2019 14:55   DG Swallowing Func-Speech Pathology  Result Date: 12/19/2019 Objective Swallowing Evaluation: Type of Study: MBS-Modified Barium Swallow Study  Patient Details Name: DEON IVEY MRN: [id number] Date of Birth: 1928-05-27 Today's Date: 12/19/2019 Time: SLP Start Time (ACUTE ONLY): 0901 -SLP Stop Time (ACUTE ONLY): 0920 SLP Time Calculation (min) ): 19 min Past Medical History: Past Medical History: Diagnosis Date . Anemia  . Arthritis  . Celiac disease  . Chronic systolic CHF (congestive heart failure) ([place])  . Complete heart block ([place])  . Coronary artery disease  . Diabetes mellitus   INSULIN DEPENDENT . GERD (gastroesophageal reflux disease)  . Glaucoma  . Headache(784.0)  . Heart disease  . Hypercholesterolemia  . Hypertension  . Iron deficiency anemia, unspecified  . Ischemic cardiomyopathy  . Memory difficulties 04/14/2019 . Shortness of breath  . Sleep apnea   uses cpap . Stroke ([place])  . Unspecified constipation  Past Surgical History: Past Surgical History: Procedure Laterality Date . BACK SURGERY   . BI-VENTRICULAR PACEMAKER INSERTION (CRT-P)  March 2014  [facility] . BIV PACEMAKER GENERATOR CHANGEOUT N/A 06/29/2019  Procedure: BIV PACEMAKER GENERATOR CHANGEOUT;  Surgeon: Evans Lance, MD;  Location: [place] CV LAB;  Service: Cardiovascular;  Laterality: N/A; . CARDIAC CATHETERIZATION  09/02/2012 . CARDIAC SURGERY   . CORONARY ANGIOPLASTY WITH STENT PLACEMENT  09/02/2012  [facility]CA . PERCUTANEOUS CORONARY STENT INTERVENTION (PCI-S) N/A 09/02/2012  Procedure:  PERCUTANEOUS CORONARY STENT INTERVENTION (PCI-S);  Surgeon: Clent Demark, MD;  Location[facility] CATH LAB;  Service: Cardiovascular;  Laterality: N/A; . PERMANENT PACEMAKER INSERTION N/A 11/29/2012  Procedure: PERMANENT PACEMAKER INSERTION;  Surgeon: Deboraha Sprang, MD;  Location: [facility] CATH LAB;  Service: Cardiovascular;  Laterality: N/A; . TEMPORARY PACEMAKER INSERTION N/A 11/28/2012  Procedure: TEMPORARY PACEMAKER INSERTION;  Surgeon: Clent Demark, MD;  Location: [place] CATH LAB;  Service: Cardiovascular;  Laterality: N/A; HPI: Pt is a 86 y.o. F with significant PMH of CHF, DM, CAD with pacemaker, HTN, prior stroke, who presents with several falls in the prior 24 hours and RLE weakness. CT negative for acute abnormality. Pt unable to receive MRI due to  pacemaker. [facility].  Assessment / Plan / Recommendation CHL IP CLINICAL IMPRESSIONS 12/19/2019 Clinical Impression Pt presents with severe oropharyngeal dysphagia and is unsafe for PO intake at this time. Pt was awake and focused attention was, but she was only able to trigger volitional swallow on command X1 during barium trials across various textures ranging from thin to puree. Pt exhibited severe oral holding of small puree, nectar, and thin boluses. When bolus size increased by clinician in attempt to increase sensory input, nectar barium spilled anteriorly out of oral cavity as well as prematurely into pharynx with very little intentional oral manipulation of bolus by pt and she was unable to follow commands to trigger volitional swallow. Only 1 reflexive swallow of thin barium was captured on imaging due to pt's decreased ability to follow commands during PO presentations, during which no aspiration or penetration was observed. However, during subsequent imaging, a trace amount of barium was visualized in laryngeal vestibule, which SLP suspects entered airway after the swallow, as a result of either lingual/palatal or vallecular residue. Suctioning  was required during 100% PO presentations throughout study today. Given severity of pt's oral phase deficits and reduced ability to follow commands, PO intake is inefficient and unsafe at this time. Recommend pt continue NPO and medical team may wish to pursue alternative means of nutrition and medication administration. ST will continue to provide skilled interventions to work toward readiness for repeat MBSS to assess potential for diet advancement. SLP Visit Diagnosis Dysphagia, oropharyngeal phase (R13.12) Attention and concentration deficit following -- Frontal lobe and executive function deficit following -- Impact on safety and function Severe aspiration risk   CHL IP TREATMENT RECOMMENDATION 12/08/2019 Treatment Recommendations Therapy as outlined in treatment plan below   Prognosis 12/08/2019 Prognosis for Safe Diet Advancement Fair Barriers to Reach Goals Cognitive deficits Barriers/Prognosis Comment -- [facility] IP DIET RECOMMENDATION 12/19/2019 SLP Diet Recommendations NPO;Alternative means - temporary;Alternative means - long-term Liquid Administration via -- Medication Administration Via alternative means Compensations -- Postural Changes --   [facility] IP OTHER RECOMMENDATIONS 12/19/2019 Recommended Consults -- Oral Care Recommendations Oral care QID Other Recommendations --   [facility] IP FOLLOW UP RECOMMENDATIONS 12/13/2019 Follow up Recommendations 24 hour supervision/assistance;Skilled Nursing facility;Inpatient Rehab   [facility] IP FREQUENCY AND DURATION 12/08/2019 Speech Therapy Frequency (ACUTE ONLY) min 2x/week Treatment Duration 2 weeks      [facility] IP ORAL PHASE 12/19/2019 Oral Phase Impaired Oral - Pudding Teaspoon -- Oral - Pudding Cup -- Oral - Honey Teaspoon -- Oral - Honey Cup -- Oral - Nectar Teaspoon -- Oral - Nectar Cup Left anterior bolus loss;Right anterior bolus loss;Weak lingual manipulation;Lingual pumping;Incomplete tongue to palate contact;Reduced posterior propulsion;Holding of bolus;Delayed oral  transit;Decreased bolus cohesion;Premature spillage;Lingual/palatal residue Oral - Nectar Straw -- Oral - Thin Teaspoon -- Oral - Thin Cup Left anterior bolus loss;Right anterior bolus loss;Weak lingual manipulation;Reduced posterior propulsion;Decreased bolus cohesion;Holding of bolus;Delayed oral transit;Lingual/palatal residue Oral - Thin Straw NT Oral - Puree Weak lingual manipulation;Decreased bolus cohesion;Delayed oral transit;Reduced posterior propulsion;Holding of bolus;Lingual pumping Oral - Mech Soft -- Oral - Regular -- Oral - Multi-Consistency -- Oral - Pill -- Oral Phase - Comment --  [facility] IP PHARYNGEAL PHASE 12/19/2019 Pharyngeal Phase Impaired Pharyngeal- Pudding Teaspoon -- Pharyngeal -- Pharyngeal- Pudding Cup -- Pharyngeal -- Pharyngeal- Honey Teaspoon -- Pharyngeal -- Pharyngeal- Honey Cup -- Pharyngeal -- Pharyngeal- Nectar Teaspoon -- Pharyngeal -- Pharyngeal- Nectar Cup Penetration/Apiration after swallow Pharyngeal Material enters airway, CONTACTS cords and not ejected out Pharyngeal-  Nectar Straw -- Pharyngeal -- Pharyngeal- Thin Teaspoon -- Pharyngeal -- Pharyngeal- Thin Cup Reduced anterior laryngeal mobility;Reduced laryngeal elevation;Delayed swallow initiation-pyriform sinuses Pharyngeal -- Pharyngeal- Thin Straw -- Pharyngeal -- Pharyngeal- Puree -- Pharyngeal -- Pharyngeal- Mechanical Soft -- Pharyngeal -- Pharyngeal- Regular -- Pharyngeal -- Pharyngeal- Multi-consistency -- Pharyngeal -- Pharyngeal- Pill -- Pharyngeal -- Pharyngeal Comment --  [facility] IP CERVICAL ESOPHAGEAL PHASE 12/19/2019 Cervical Esophageal Phase WFL Pudding Teaspoon -- Pudding Cup -- Honey Teaspoon -- Honey Cup -- Nectar Teaspoon -- Nectar Cup -- Nectar Straw -- Thin Teaspoon -- Thin Cup -- Thin Straw -- Puree -- Mechanical Soft -- Regular -- Multi-consistency -- Pill -- Cervical Esophageal Comment -- Arbutus Leas 12/19/2019, 9:56 AM              ECHOCARDIOGRAM COMPLETE  Result Date: 12/07/2019    ECHOCARDIOGRAM REPORT    Patient Name:   BONETTA MOSTEK Date of Exam: 12/07/2019 Medical Rec #:  [id number]         Height:       64.0 in Accession #:    [id number]        Weight:       173.7 lb Date of Birth:  1928/03/19         BSA:          1.84 m Patient Age:    43 years          BP:           128/56 mmHg Patient Gender: F                 HR:           77 bpm. Exam Location:  Inpatient Procedure: 2D Echo Indications:    weakness  History:        Patient has prior history of Echocardiogram examinations, most                 recent 07/02/2019. Cardiomyopathy and CHF, CAD, Pacemaker,                 Stroke; Risk Factors:Hypertension, Dyslipidemia, Diabetes and                 Former Smoker. Complete heart block.  Sonographer:    Jannett Celestine RDCS (AE) Referring Phys: [id number] [place] T [facility]  Sonographer Comments: Suboptimal apical window and suboptimal subcostal window. limited mobility. patient unable to turn entirely on left side (partially). apical windows obtained supine. IMPRESSIONS  1. Left ventricular ejection fraction, by estimation, is 50 to 55%. The left ventricle has low normal function. The left ventricle has no regional wall motion abnormalities. There is mild concentric left ventricular hypertrophy. Left ventricular diastolic parameters are indeterminate.  2. Right ventricular systolic function is mildly reduced. The right ventricular size is normal. There is severely elevated pulmonary artery systolic pressure.  3. The mitral valve is degenerative. Mild mitral valve regurgitation.  4. The aortic valve is tricuspid. Aortic valve regurgitation is not visualized. No aortic stenosis is present. FINDINGS  Left Ventricle: Left ventricular ejection fraction, by estimation, is 50 to 55%. The left ventricle has low normal function. The left ventricle has no regional wall motion abnormalities. The left ventricular internal cavity size was normal in size. There is mild concentric left ventricular hypertrophy. Left ventricular diastolic  parameters are indeterminate. Right Ventricle: The right ventricular size is normal. No increase in right ventricular wall thickness. Right ventricular systolic function is mildly reduced. There is severely elevated pulmonary  artery systolic pressure. The tricuspid regurgitant velocity is 2.23 m/s, and with an assumed right atrial pressure of 87 mmHg, the estimated right ventricular systolic pressure is 616.0 mmHg. Left Atrium: Left atrial size was not well visualized. Right Atrium: Right atrial size was not well visualized. Pericardium: There is no evidence of pericardial effusion. Mitral Valve: The mitral valve is degenerative in appearance. There is mild thickening of the mitral valve leaflet(s). There is mild calcification of the mitral valve leaflet(s). Mild mitral valve regurgitation. Tricuspid Valve: The tricuspid valve is normal in structure. Tricuspid valve regurgitation is mild . No evidence of tricuspid stenosis. Aortic Valve: The aortic valve is tricuspid. . There is severe thickening and severe calcifcation of the aortic valve. Aortic valve regurgitation is not visualized. No aortic stenosis is present. There is severe thickening of the aortic valve. There is severe calcifcation of the aortic valve. Pulmonic Valve: The pulmonic valve was grossly normal. Pulmonic valve regurgitation is not visualized. No evidence of pulmonic stenosis. Aorta: The aortic root is normal in size and structure. Venous: The inferior vena cava was not well visualized. IAS/Shunts: The atrial septum is grossly normal. Additional Comments: A pacer wire is visualized.  LEFT VENTRICLE PLAX 2D LVIDd:         3.10 cm  Diastology LVIDs:         2.53 cm  LV e' lateral:   4.00 cm/s LV PW:         1.20 cm  LV E/e' lateral: 19.4 LV IVS:        1.40 cm  LV e' medial:    4.00 cm/s LVOT diam:     2.00 cm  LV E/e' medial:  19.4 LV SV:         37.70 ml LV SV Index:   7.77 LVOT Area:     3.14 cm  RIGHT VENTRICLE RV S prime:     10.00 cm/s TAPSE  (M-mode): 1.6 cm LEFT ATRIUM             Index       RIGHT ATRIUM           Index LA diam:        2.80 cm 1.52 cm/m  RA Area:     11.60 cm LA Vol (A2C):   35.3 ml 19.16 ml/m RA Volume:   21.00 ml  11.40 ml/m LA Vol (A4C):   32.6 ml 17.69 ml/m LA Biplane Vol: 36.2 ml 19.65 ml/m  AORTIC VALVE LVOT Vmax:   60.60 cm/s LVOT Vmean:  39.800 cm/s LVOT VTI:    0.120 m MITRAL VALVE               TRICUSPID VALVE MV Area (PHT): 2.29 cm    TR Peak grad:   19.9 mmHg MV Decel Time: 331 msec    TR Vmax:        223.00 cm/s MV E velocity: 77.60 cm/s                            SHUNTS                            Systemic VTI:  0.12 m                            Systemic Diam: 2.00 cm Buford Dresser MD Electronically signed by  Buford Dresser MD Signature Date/Time: 12/07/2019/9:19:01 PM    Final    DG Hip Unilat W or Wo Pelvis 2-3 Views Right  Result Date: 12/06/2019 CLINICAL DATA:  86 year old female status post fall with right hip pain. EXAM: DG HIP (WITH OR WITHOUT PELVIS) 2-3V RIGHT COMPARISON:  Right hip series 12/15/2003. FINDINGS: Chronic fibroid uterus with dystrophic calcifications. Femoral heads are normally located. Hip joint spaces appear symmetric. The proximal right femur appears intact. The proximal left femur appear symmetric and grossly intact. No pelvic fracture is identified. Mild spurring at the pubic symphysis is chronic. Negative visible bowel gas pattern. Calcified femoral artery atherosclerosis. IMPRESSION: No acute fracture or dislocation identified about the right hip or pelvis. Electronically Signed   By: Genevie Ann M.D.   On: 12/06/2019 20:55    Labs:  CBC: Recent Labs    12/22/19 0648 12/23/19 0552 12/26/19 0518 01/02/20 0538  WBC 11.3* 11.1* 10.3 8.4  HGB 9.5* 9.5* 9.3* 8.6*  HCT 30.6* 30.5* 30.4* 28.0*  PLT 222 227 190 258    COAGS: Recent Labs    07/01/19 1852 10/09/19 1719 10/12/19 1952 12/06/19 1650  INR 1.0 0.9 1.0 1.0  APTT 35 31 32 32    BMP: Recent  Labs    12/24/19 0534 12/25/19 0521 12/26/19 0518 01/02/20 0538  NA 142 141 139 141  K 4.2 3.9 3.8 3.6  CL 104 104 103 102  CO2 30 29 30 28   GLUCOSE 312* 287* 238* 152*  BUN 21 20 23 20   CALCIUM 11.2* 10.8* 9.9 8.3*  CREATININE 1.18* 1.15* 1.21* 1.07*  GFRNONAA 41* 42* 40* 46*  GFRAA 47* 49* 46* 53*    LIVER FUNCTION TESTS: Recent Labs    11/04/19 1417 11/04/19 1417 12/06/19 1650 12/06/19 1650 12/15/19 0643 12/15/19 4496 12/18/19 0951 12/22/19 0648 12/23/19 0552 12/24/19 0534 12/25/19 0521 12/26/19 0518  BILITOT 0.6  --  0.7  --  0.2*  --  0.6  --   --   --   --   --   AST 24  --  24  --  38  --  27  --   --   --   --   --   ALT 21  --  27  --  33  --  27  --   --   --   --   --   ALKPHOS 61  --  67  --  61  --  69  --   --   --   --   --   PROT 6.7  --  7.0  --  6.5  --  6.9  --   --   --   --   --   ALBUMIN 3.6   < > 3.7   < > 3.1*   < > 3.2*   < > 2.4* 2.6* 2.5* 2.5*   < > = values in this interval not displayed.     Assessment and Plan:  Dysphagia. Plan for image-guided percutaneous gastrostomy tube placement tentatively for tomorrow 01/03/2020 in IR. Case has been reviewed by Dr. Anselm Pancoast who approves procedure with use of barium- barium tubed to floor, please administer via NGT as soon as possible this afternoon. Will check KUB in AM, ordered. Patient to be NPO at midnight (including holding of tube feeds). Afebrile. Plavix and Lovenox held per IR protocol. INR pending for 0500 tomorrow.  Risks and benefits discussed with the patient including, but not limited  to the need for a barium enema during the procedure, bleeding, infection, peritonitis, or damage to adjacent structures. All of the patient's daughters' questions were answered, she is agreeable to proceed. Consent obtained by patient's daughter, Maxwell Marion, via telephone- signed and in IR binder.   Thank you for this interesting consult.  I greatly enjoyed meeting [place] and look  forward to participating in their care.  A copy of this report was sent to the requesting provider on this date.  Electronically Signed: Earley Abide, PA-C 01/02/2020, 3:28 PM   I spent a total of 40 Minutes in face to face in clinical consultation, greater than 50% of which was counseling/coordinating care for dysphagia/percutaneous gastrostomy tube placement.

## 2020-01-02 NOTE — Progress Notes (Addendum)
[place] PHYSICAL MEDICINE & REHABILITATION PROGRESS NOTE   Subjective/Complaints:   Tired this am but awakens to command  Appreciate GI note   ROS: limited by cognition     Objective:   No results found. Recent Labs    01/02/20 0538  WBC 8.4  HGB 8.6*  HCT 28.0*  PLT 258   Recent Labs    01/02/20 0538  NA 141  K 3.6  CL 102  CO2 28  GLUCOSE 152*  BUN 20  CREATININE 1.07*  CALCIUM 8.3*    Intake/Output Summary (Last 24 hours) at 01/02/2020 1002 Last data filed at 01/02/2020 0801 Gross per 24 hour  Intake 0 ml  Output 1100 ml  Net -1100 ml     Physical Exam: Vital Signs Blood pressure (!) 152/74, pulse 74, temperature 98.8 F (37.1 C), temperature source Oral, resp. rate 18, height 5' 7"  (1.702 m), weight 81.4 kg, SpO2 98 %.    General: No acute distress Mood and affect are appropriate Heart: Regular rate and rhythm no rubs murmurs or extra sounds Lungs: Clear to auscultation, breathing unlabored, no rales or wheezes Abdomen: Positive bowel sounds, soft nontender to palpation, nondistended Extremities: No clubbing, cyanosis, or edema Skin: No evidence of breakdown, no evidence of rash   Skin: R lateral heel DTI seen ~ 1inch diameter    Assessment/Plan: 1. Functional deficits secondary to Left brainstem vs subcortical infarct which require 3+ hours per day of interdisciplinary therapy in a comprehensive inpatient rehab setting.  Physiatrist is providing close team supervision and 24 hour management of active medical problems listed below.  Physiatrist and rehab team continue to assess barriers to discharge/monitor patient progress toward functional and medical goals  Care Tool:  Bathing  Bathing activity did not occur: Safety/medical concerns Body parts bathed by patient: Face   Body parts bathed by helper: Right arm, Left arm, Chest, Abdomen, Front perineal area, Buttocks, Right upper leg, Left upper leg, Right lower leg, Left lower leg      Bathing assist Assist Level: 2 Helpers     Upper Body Dressing/Undressing Upper body dressing   What is the patient wearing?: Pull over shirt    Upper body assist Assist Level: Total Assistance - Patient < 25%    Lower Body Dressing/Undressing Lower body dressing      What is the patient wearing?: Pants     Lower body assist Assist for lower body dressing: 2 Helpers     Toileting Toileting Toileting Activity did not occur (Clothing management and hygiene only): N/A (no void or bm)  Toileting assist Assist for toileting: Dependent - Patient 0%(3 helpers)     Transfers Chair/bed transfer  Transfers assist  Chair/bed transfer activity did not occur: Safety/medical concerns  Chair/bed transfer assist level: 2 Helpers     Locomotion Ambulation   Ambulation assist   Ambulation activity did not occur: Safety/medical concerns          Walk 10 feet activity   Assist  Walk 10 feet activity did not occur: Safety/medical concerns        Walk 50 feet activity   Assist Walk 50 feet with 2 turns activity did not occur: Safety/medical concerns         Walk 150 feet activity   Assist Walk 150 feet activity did not occur: Safety/medical concerns         Walk 10 feet on uneven surface  activity   Assist Walk 10 feet on uneven surfaces activity did  not occur: Safety/medical concerns         Wheelchair     Assist Will patient use wheelchair at discharge?: Yes Type of Wheelchair: Manual Wheelchair activity did not occur: Safety/medical concerns         Wheelchair 50 feet with 2 turns activity    Assist    Wheelchair 50 feet with 2 turns activity did not occur: Safety/medical concerns       Wheelchair 150 feet activity     Assist  Wheelchair 150 feet activity did not occur: Safety/medical concerns       Blood pressure (!) 152/74, pulse 74, temperature 98.8 F (37.1 C), temperature source Oral, resp. rate 18, height  5' 7"  (1.702 m), weight 81.4 kg, SpO2 98 %.    Medical Problem List and Plan: 1.  Rightt side hemiplegia, dysarthria, +/- aphasia secondary to left brain infarction.  MRI not completed due to pacemaker  -CT demonstrates hypoattenuation in left pons which is consistent with current presentation---increased right hp and increased difficulty with oro-pharyngeal control             -patient may shower             Plan is for SNF after CIR              Continue PT, OT, SLP  2.  Antithrombotics: -DVT/anticoagulation: Subcutaneous Lovenox             -antiplatelet therapy: Continue Aspirin 81 mg daily, Plavix 75 mg daily 3. Pain Management: ContinueTylenol as needed, pain is well controlled.  4. Mood with memory deficits.  Aricept 10 mg nightly             -antipsychotic agents: N/A 5. Neuropsych: This patient is NOT capable of making decisions on her own behalf. 6. Skin/Wound Care: Routine skin checks 7. Fluids/Electrolytes/Nutrition: Routine in and outs.  CMP ordered for tomorrow a.m.  3/13- ordered qmonday labs 8.  Hypertension.  Norvasc 2.5 mg daily, Coreg 3.125 mg twice daily.               Monitor with increased mobility Vitals:   01/02/20 0449 01/02/20 0816  BP: (!) 143/50 (!) 152/74  Pulse: 70 74  Resp: 18   Temp: 98.8 F (37.1 C)   SpO2: 98%    Remains mildly elevated increase amlodipine to 91m daily  9.  Diabetes mellitus with hyperglycemia.  Hemoglobin A1c 8.2.  Lantus insulin 10 units twice daily, NovoLog 2 units 3 times daily check blood sugars before meals and at bedtime              CBG (last 3)  Recent Labs    01/02/20 0026 01/02/20 0439 01/02/20 0839  GLUCAP 144* 144* 163*  adequate in hospital control  10.  CAD with stenting as well as pacemaker.  Continue aspirin and Plavix. 11.  CKD stage IIIb.  Creatinine baseline 1.56.             Normalizing back to baseline  3/13- labs monday  12.  Chronic diastolic congestive heart failure.  Monitor for any signs of fluid  overload             Daily weights   Filed Weights   12/30/19 1429 12/31/19 0329 01/02/20 0451  Weight: 80.1 kg 82.3 kg 81.4 kg   3/14- weight overall stable, however no weight today- will ask nursing.  13.  OSA.  CPAP. 14.  Hyperlipidemia.  Lipitor 15.  Chronic normocytic anemia.  Continue iron supplement             hgb 10 16.  Post stroke dysphagia: Dysphagia #2 thin liquids.  3/13- was on D2 thin lqiuids, but appears is now NPO- getting TFs by NGT- con't regimen and monitor- will need open to get PEG    Follow-up speech therapy.  Advance as tolerated  3/2: see diet/nutrition.  17.  UTI/Citrobacter/EColi:  Resolved    19. Hypercalcemia- ? Etiology, nephro following 20.  Severe Dysphagia, NPO-    Will need G-tube.  Will see if IR feels they can place it, otherwise will need general surgery to place via open procedure    21. R heel DTI  3/13- might have been blister initially but now DTI- pic as above- will cover for standing activities and wear PRAFO when not standing otherwise.   22. Sedation- actually has improved. Brainstem and subocrtical infarcts typically do not cause RAS depression but may trial ritalin     23. Urinary retention  Severe stroke plus immobility +/- diabetic cystopathy  , may trial flomax /urecholine but given severe mobility issues not optimistic about effect  Will 24.  Stercoral ulcer LOS: 19 days A FACE TO FACE EVALUATION WAS PERFORMED  Charlett Blake 01/02/2020, 10:02 AM

## 2020-01-02 NOTE — Progress Notes (Signed)
Physical Therapy Session Note  Patient Details  Name: [name] MRN: [id number] Date of Birth: 05-30-1928  Today's Date: 01/02/2020 PT Individual Time: 0905-1000 PT Individual Time Calculation (min): 55 min   Short Term Goals: Week 3:  PT Short Term Goal 1 (Week 3): Pt will perform bed mobility with max assist PT Short Term Goal 2 (Week 3): Pt will transfer to [facility] with mod assist PT Short Term Goal 3 (Week 3): Pt will sustain standing in parallel bars up to 45 seconds.  Skilled Therapeutic Interventions/Progress Updates: Pt presented in bed sleeping but easily aroused. Pt moaning when PRAFO removed thus PROM performed to R hip (flexion/extension, ER/IR). Pt performed rolling to R minA with increased time to check for soiled brief (dry). Pt then performed rolling R minA and L maxA to don pants and pull up over hips. Performed supine to sit maxA with pt demonstrating fair trunk activation and able to initiate sit up. PTA used draw sheet to straighten hips. Slide board set up and performed SB transfer to [place] x 1. Pt transported to rehab gym and performed SB transfer in same manner as prior to mat. Participated in sitting balance activities reaching with LUE to grab horseshoes. Pt demonstrating R lateral lean however was able to correct with min/mod multimodal cues. Pt noted to be able to reach horseshoe with min challenges however visibly fatigued and unable to correct to neutral with mod cues. Pt did demonstrate some improvement with mirror feedback. Pt returned to TIS in SB transfer maxA x 1 with +2 for safety. Pt transported back to room and NT notified pt needed to be returned to bed for cath. Pt required maxA x 2 for SB transfer to bed and required maxA for sit to supine. Pt repositioned to comfort and left in presence of NT.      Therapy Documentation Precautions:  Precautions Precautions: Fall Restrictions Weight Bearing Restrictions: No General:   Vital Signs: Therapy  Vitals Temp: 98.4 F (36.9 C) Pulse Rate: 70 Resp: 19 BP: (!) 155/61 Patient Position (if appropriate): Lying Oxygen Therapy SpO2: 98 % O2 Device: Room Air    Therapy/Group: Individual Therapy  Sherlynn Tourville 01/02/2020, 4:21 PM

## 2020-01-02 NOTE — Op Note (Signed)
[facility] Main Patient Name: [name] Procedure Date : 12/30/2019 MRN: [id number] Attending MD: Carol Ada , MD Date of Birth: 1928/09/28 CSN: [id number] Age: 86 Admit Type: Inpatient Procedure:                Flexible Sigmoidoscopy Indications:              Abnormal CT of the GI tract Providers:                Carol Ada, MD, Grace Isaac, RN, Elspeth Cho                            Tech., Technician Referring MD:              Medicines:                None Complications:            No immediate complications. Estimated Blood Loss:     Estimated blood loss: none. Procedure:                Pre-Anesthesia Assessment:                           - Prior to the procedure, a History and Physical                            was performed, and patient medications and                            allergies were reviewed. The patient's tolerance of                            previous anesthesia was also reviewed. The risks                            and benefits of the procedure and the sedation                            options and risks were discussed with the patient.                            All questions were answered, and informed consent                            was obtained. Prior Anticoagulants: The patient has                            taken Plavix (clopidogrel), last dose was 1 day                            prior to procedure. ASA Grade Assessment: III - A                            patient with severe systemic disease. After  reviewing the risks and benefits, the patient was                            deemed in satisfactory condition to undergo the                            procedure.                           - No sedation medications were administered.                           After obtaining informed consent, the scope was                            passed under direct vision. The [id number]-H190 ([id number])                             Olympus gastroscope was introduced through the anus                            and advanced to the the sigmoid colon. The flexible                            sigmoidoscopy was accomplished without difficulty.                            The patient tolerated the procedure well. The                            quality of the bowel preparation was good. Scope In: Scope Out: Findings:      A single (solitary) fifteen mm ulcer was found in the rectum. Oozing was       present. Stigmata of recent bleeding were present.      I spoke at length with the patient's daughter and her niece about       performing the [place] sooner than the 3 day time period off of Plavix.       There was no urgency to the procedure and they were provided the option       of waiting until Monday, however, it was felt that a FFS with the       potential of superficial mucosal biopsies would be safe within this 3       day time period. The endoscopic examination was positive for a rectal       ulcer consistent with a stercoral ulcer. She was recently started on       Miralax to help with her bowel movements. The residual soft stool was       able to be lavaged and suctioned without difficulty, which allowed for       clear visualization of the mucosa. There was no evidence of any       inflammation in the surrounding ulcerated area, the rectum, or the       sigmoid colon. The ulcer exhibited some minor oozing. Palpation of the       area with a digital rectal examination was negative  for any suspicious       firmness to suggest a malignancy. In this instance, it was not felt that       any biopsies were required at this time. If this situation worsens or if       she develops hematochezia a repeat FFS can be performed. The results       were discussed with the patient's daughter and niece. Impression:               - A single (solitary) ulcer in the rectum.                           - No specimens  collected. Recommendation:           - Return patient to hospital ward for ongoing care.                           - Provide laxatives to avoid constipation.                           - Resume Plavix if the patient is not going to                            undergo a PEG with Surgery. Procedure Code(s):        --- Professional ---                           [phone], Sigmoidoscopy, flexible; diagnostic,                            including collection of specimen(s) by brushing or                            washing, when performed (separate procedure) Diagnosis Code(s):        --- Professional ---                           K62.6, Ulcer of anus and rectum                           R93.3, Abnormal findings on diagnostic imaging of                            other parts of digestive tract CPT copyright 2019 [employer]. All rights reserved. The codes documented in this report are preliminary and upon coder review may  be revised to meet current compliance requirements. Carol Ada, MD Carol Ada, MD 12/30/2019 3:41:11 PM This report has been signed electronically. Number of Addenda: 0

## 2020-01-02 NOTE — Progress Notes (Signed)
Speech Language Pathology Daily Session Note  Patient Details  Name: [name] MRN: [id number] Date of Birth: 1928/05/11  Today's Date: 01/02/2020 SLP Individual Time: 0732-0830 SLP Individual Time Calculation (min): 58 min  Short Term Goals: Week 3: SLP Short Term Goal 1 (Week 3): Pt will consume therapeutic trials of ice chips and/or thin liquids with efficient oral manipulation of bolus and timely swallow initiation with Max A multimodal cues. SLP Short Term Goal 2 (Week 3): Pt will increase speech intelligibility to 60% at the word level with Max A multimodal cues for use of increased vocal intensity. SLP Short Term Goal 3 (Week 3): Pt will sustain attentoin to functional tasks for 10 minute intervals with Mod A verbal/visual cues. SLP Short Term Goal 4 (Week 3): Pt will demonstrate problem solving during basic faimliar tasks with Mod A verbal/visual cues.  Skilled Therapeutic Interventions: Pt was seen for skilled ST targeting dysphagia and cognitive-linguistic goals. Pt unable to sustain attention to picture description or verbal speech tasks, therefore focus shifted to cognitive interventions. She also presented with increased confusion today, when verbal stating "I don't know" in response to most questions. Overall Max A verbal cues provided for sustained attention to functional familiar and self care tasks. Mod A verbal cues also required for pt to locate therapist on left side of body. Mod A verbal and visual cues for recall required for following 1-step directions, increased Max A for 2-step. Dysphagia interventions targeted secretion management, which still requires Max A multimodal cues in order for pt to compensate for anterior spillage. Pt performed effortful swallow X5 with Max A verbal and visual cues, however ceased to follow directions to continue after 5 repetitions. Pt left sitting in bed with alarm set and RN present. Continue per current plan of care.         Pain Pain Assessment Pain Scale: Faces Pain Score: 0-No pain  Therapy/Group: Individual Therapy  Arbutus Leas 01/02/2020, 8:02 AM

## 2020-01-02 NOTE — Plan of Care (Signed)
OT POC adjusted, please see POC for details  Problem: Sit to Stand Goal: LTG:  Patient will perform sit to stand in prep for activites of daily living with assistance level (OT) Description: LTG:  Patient will perform sit to stand in prep for activites of daily living with assistance level (OT) Outcome: Adequate for Discharge Note: Goal d/c due to slow progress    Problem: RH Tub/Shower Transfers Goal: LTG Patient will perform tub/shower transfers w/assist (OT) Description: LTG: Patient will perform tub/shower transfers with assist, with/without cues using equipment (OT) Outcome: Adequate for Discharge Note: Goal d/c due to slow progress    Problem: RH Functional Use of Upper Extremity Goal: LTG Patient will use RT/LT upper extremity as a (OT) Description: LTG: Patient will use right/left upper extremity as a stabilizer/gross assist/diminished/nondominant/dominant level with assist, with/without cues during functional activity (OT) Flowsheets (Taken 01/02/2020 1244) LTG: Pt will use upper extremity in functional activity with assistance level of: Moderate Assistance - Patient 50 - 74% Note: Goal downgraded due to slow progress

## 2020-01-02 NOTE — Progress Notes (Signed)
Occupational Therapy Session Note  Patient Details  Name: [name] MRN: [id number] Date of Birth: 1928/05/14  Today's Date: 01/02/2020 OT Individual Time: 0881-1031 OT Individual Time Calculation (min): 38 min  and Today's Date: 01/02/2020 OT Missed Time: 20 Minutes Missed Time Reason: Patient fatigue   Short Term Goals: Week 3:  OT Short Term Goal 1 (Week 3): Pt will thread her affected arm into shirt sleeve with Mod A to improve Rt sided attention OT Short Term Goal 2 (Week 3): Pt will release grip on bedrail during functional bed mobility related to dressing or toileting with no more than mod multimodal cuing  Skilled Therapeutic Interventions/Progress Updates:    Upon entering the room, pt supine in bed and very lethargic throughout session. Pt asked if she was in pain and points to R hip. OT stretching hip in all planes of movement and pt not observed to be grimacing from stretching but also was unable to report any relief. OT placed PRAFO boot correctly onto R LE. PROM to R UE and repositioning for safety. Resting hand splint donned as well. Pt remains lethargic and unable to stay awake. +2 assistance needed to pull pt up in bed for repositioning. HOB at 30 degrees with feeding continuing. Bed alarm activated and soft call bell placed into L UE. RN notified of lethargy.   Therapy Documentation Precautions:  Precautions Precautions: Fall Restrictions Weight Bearing Restrictions: No General: General OT Amount of Missed Time: 20 Minutes Vital Signs: Therapy Vitals Temp: 98.4 F (36.9 C) Pulse Rate: 70 Resp: 19 BP: (!) 155/61 Patient Position (if appropriate): Lying Oxygen Therapy SpO2: 98 % O2 Device: Room Air Pain: Pain Assessment Pain Scale: Faces Faces Pain Scale: Hurts a little bit Pain Type: Acute pain Pain Location: Leg Pain Orientation: Right Pain Descriptors / Indicators: Grimacing;Moaning Pain Onset: On-going Pain Intervention(s): RN made  aware Multiple Pain Sites: No   Therapy/Group: Individual Therapy  Gypsy Decant 01/02/2020, 1:41 PM

## 2020-01-03 ENCOUNTER — Inpatient Hospital Stay (HOSPITAL_COMMUNITY): Payer: Medicare Other

## 2020-01-03 ENCOUNTER — Inpatient Hospital Stay (HOSPITAL_COMMUNITY): Payer: Medicare Other | Admitting: Speech Pathology

## 2020-01-03 ENCOUNTER — Inpatient Hospital Stay (HOSPITAL_COMMUNITY): Payer: Medicare Other | Admitting: Physical Therapy

## 2020-01-03 ENCOUNTER — Inpatient Hospital Stay (HOSPITAL_COMMUNITY): Payer: Medicare Other | Admitting: Occupational Therapy

## 2020-01-03 HISTORY — PX: IR GASTROSTOMY TUBE MOD SED: IMG625

## 2020-01-03 LAB — GLUCOSE, CAPILLARY
Glucose-Capillary: 119 mg/dL — ABNORMAL HIGH (ref 70–99)
Glucose-Capillary: 143 mg/dL — ABNORMAL HIGH (ref 70–99)
Glucose-Capillary: 173 mg/dL — ABNORMAL HIGH (ref 70–99)
Glucose-Capillary: 56 mg/dL — ABNORMAL LOW (ref 70–99)
Glucose-Capillary: 80 mg/dL (ref 70–99)
Glucose-Capillary: 85 mg/dL (ref 70–99)
Glucose-Capillary: 88 mg/dL (ref 70–99)

## 2020-01-03 LAB — PROTIME-INR
INR: 1.1 (ref 0.8–1.2)
Prothrombin Time: 13.6 seconds (ref 11.4–15.2)

## 2020-01-03 MED ORDER — FENTANYL CITRATE (PF) 100 MCG/2ML IJ SOLN
INTRAMUSCULAR | Status: AC
  Filled 2020-01-03: qty 2

## 2020-01-03 MED ORDER — DEXTROSE 50 % IV SOLN
1.0000 | Freq: Once | INTRAVENOUS | Status: AC
  Administered 2020-01-03: 50 mL via INTRAVENOUS
  Filled 2020-01-03: qty 50

## 2020-01-03 MED ORDER — MIDAZOLAM HCL 2 MG/2ML IJ SOLN
INTRAMUSCULAR | Status: AC
  Filled 2020-01-03: qty 2

## 2020-01-03 MED ORDER — GLUCAGON HCL RDNA (DIAGNOSTIC) 1 MG IJ SOLR
INTRAMUSCULAR | Status: AC | PRN
  Administered 2020-01-03: 1 mg via INTRAVENOUS

## 2020-01-03 MED ORDER — SODIUM CHLORIDE 0.45 % IV SOLN: INTRAVENOUS | Status: DC

## 2020-01-03 MED ORDER — MIDAZOLAM HCL 2 MG/2ML IJ SOLN
INTRAMUSCULAR | Status: AC | PRN
  Administered 2020-01-03: 0.5 mg via INTRAVENOUS

## 2020-01-03 MED ORDER — BETHANECHOL CHLORIDE 10 MG PO TABS
10.0000 mg | ORAL_TABLET | Freq: Four times a day (QID) | ORAL | Status: DC
  Administered 2020-01-03 – 2020-01-05 (×8): 10 mg
  Filled 2020-01-03 (×8): qty 1

## 2020-01-03 MED ORDER — IOHEXOL 300 MG/ML  SOLN
50.0000 mL | Freq: Once | INTRAMUSCULAR | Status: AC | PRN
  Administered 2020-01-03: 10 mL

## 2020-01-03 MED ORDER — FENTANYL CITRATE (PF) 100 MCG/2ML IJ SOLN
INTRAMUSCULAR | Status: AC | PRN
  Administered 2020-01-03: 25 ug via INTRAVENOUS

## 2020-01-03 MED ORDER — LIDOCAINE-EPINEPHRINE 1 %-1:100000 IJ SOLN
INTRAMUSCULAR | Status: AC
  Filled 2020-01-03: qty 1

## 2020-01-03 MED ORDER — DEXTROSE 50 % IV SOLN
INTRAVENOUS | Status: AC
  Administered 2020-01-03: 12.5 g via INTRAVENOUS
  Filled 2020-01-03: qty 50

## 2020-01-03 MED ORDER — LIDOCAINE HCL 1 % IJ SOLN
INTRAMUSCULAR | Status: AC | PRN
  Administered 2020-01-03: 10 mL

## 2020-01-03 MED ORDER — GLUCAGON HCL RDNA (DIAGNOSTIC) 1 MG IJ SOLR
INTRAMUSCULAR | Status: AC
  Filled 2020-01-03: qty 1

## 2020-01-03 MED ORDER — DEXTROSE 50 % IV SOLN: 12.5000 g | INTRAVENOUS | Status: AC

## 2020-01-03 NOTE — Progress Notes (Signed)
Physical Therapy Session Note  Patient Details  Name: [name] MRN: [id number] Date of Birth: 1928/01/07  Today's Date: 01/03/2020     Short Term Goals: Week 3:  PT Short Term Goal 1 (Week 3): Pt will perform bed mobility with max assist PT Short Term Goal 2 (Week 3): Pt will transfer to [facility] with mod assist PT Short Term Goal 3 (Week 3): Pt will sustain standing in parallel bars up to 45 seconds.  Skilled Therapeutic Interventions/Progress Updates: Pt missed 75 min skilled PT due to pt off unit for placement of PEG. Will continue efforts.      Therapy Documentation Precautions:  Precautions Precautions: Fall Restrictions Weight Bearing Restrictions: No General: PT Amount of Missed Time (min): 75 Minutes PT Missed Treatment Reason: Unavailable (Comment);Other (Comment)    Therapy/Group: Individual Therapy  Adri Schloss  Nesta Kimple, PTA  01/03/2020, 10:37 AM

## 2020-01-03 NOTE — Progress Notes (Signed)
Occupational Therapy Session Note  Patient Details  Name: [name] MRN: [id number] Date of Birth: 02/26/1928  Today's Date: 01/03/2020 OT Individual Time: 8676-7209 OT Individual Time Calculation (min): 70 min    Short Term Goals: Week 3:  OT Short Term Goal 1 (Week 3): Pt will thread her affected arm into shirt sleeve with Mod A to improve Rt sided attention OT Short Term Goal 2 (Week 3): Pt will release grip on bedrail during functional bed mobility related to dressing or toileting with no more than mod multimodal cuing  Skilled Therapeutic Interventions/Progress Updates:   Upon entering the room, pt supine in bed and sleeping soundly. Pt does open eyes and respond when spoken to. Pt rolling L <> R with max A and total A to don LB clothing items and check brief. Supine >sit with total A to EOB. Pt sitting EOB for 10 minutes with static sitting balance supervision - min guard for balance. Suction toothbrush utilized for oral care with total A. Pt with increase saliva production this session and needing extra suctioning. +2 assistance needed for slide board transfer from bed >wheelchair. Total A to transfer pt across board and use of second person to steady all equipment for safety into tilt in space. Pt assisted via wheelchair to gym for standing frame task. Pt's BP seated in wheelchair with results of 156/62. Pt positioned in standing frame for weight bearing through R UE with use of mirror for visual feedback for midline orientation. Pt reaching with L UE to obtain cone and then reaching across midline to R side. Pt fatigues quickly and needing assistance for upright position. Pt standing for 10 minutes and then very fatigued and returned back to wheelchair. BP at this time is 150/65. Pt assisted to RN station and remaining in wheelchair in order to have an alerting environment. All needs within reach.    Therapy Documentation Precautions:  Precautions Precautions:  Fall Restrictions Weight Bearing Restrictions: No General:   Vital Signs: Therapy Vitals Temp: 98.2 F (36.8 C) Temp Source: Oral Pulse Rate: 71 Resp: 19 BP: (!) 147/60 Patient Position (if appropriate): Sitting Oxygen Therapy SpO2: 100 % O2 Device: Room Air   Therapy/Group: Individual Therapy  Gypsy Decant 01/03/2020, 4:22 PM

## 2020-01-03 NOTE — Progress Notes (Signed)
Nutrition Follow-up  DOCUMENTATION CODES:   Not applicable  INTERVENTION:  When ready, resume tube feeding via PEG:  Continue Jevity 1.2@ 60ml/hr via Cortrak  -30ml Prostat daily.  -Free water per MD/PA, currently 140 ml  every 6 hours  Tube feeding regimen provides1828kcal (100% of needs),95grams of protein, and 1162ml of H2O. Total free water: 1722 ml  NUTRITION DIAGNOSIS:   Inadequate oral intake related to inability to eat, dysphagia as evidenced by NPO status.  Ongoing.  GOAL:   Patient will meet greater than or equal to 90% of their needs  Met with tube feeding.  MONITOR:   Diet advancement, Labs, Weight trends, TF tolerance, Skin, I & O's  REASON FOR ASSESSMENT:   Consult Enteral/tube feeding initiation and management  ASSESSMENT:   [name] is an 86-year-old right-handed female with history of CAD with stenting, CKD stage III with creatinine 1.56, CVA maintained on Plavix, memory loss maintained on Aricept followed by Dr. Willis, heart block with pacemaker, hypertension, type 2 diabetes mellitus, OSA with CPAP, chronic normocytic anemia, chronic diastolic congestive heart failure.  History taken from chart review due to cognition.  Patient lives with family.  1 level home.  Used a cane prior to admission for mobility and daughter assist with some basic ADLs.  She presented on 12/07/2019 after fall without LOC.  She was noted to have slurred speech and left hemiparesis.  Cranial CT scan unremarkable for acute intracranial process.  Patient did not receive TPA.  MRI not completed due to pacemaker.  Echocardiogram with ejection fraction of 55% without emboli.  Neurology follow-up currently maintained on aspirin and Plavix for CVA prophylaxis subcutaneous Lovenox for DVT prophylaxis.  Admission chemistries with creatinine 1.62, SARS coronavirus negative.  Hospital course further complicated by postop dysphagia, on a dysphagia 2 thin liquid diet.   Urinalysis study 12/12/2019 greater than 100,000 Citrobacter placed on Merrem 12/13/2019 for UTI.  Therapy evaluations completed and patient was admitted for a comprehensive rehab program.  Please see preadmission assessment from earlier today as well.  Pt admitted with lt side hemiplegia, dysarthria, and aphasia secondary to lt brain infarction.   2/25- s/p BSE- diet downgraded from dysphagia 2 diet with thin liquids to dysphagia 1 diet with thin liquids, due to fatigue; head CT revealedsubacute left pontine infarct 2/26- s/p BSE- diet downgraded to dysphagia 1 with nectar thick liquids 3/1- s/p MBSS- recommend NPO, unsafe for PO intake at this time; NGT placed at bedside- tip of tube confirmed in stomach via KUB  3/2 - Cortrak (gastric) 3/12 - s/p sigmoidoscopy 3/16 - PEG placed  Pt unable to answer RD questions at this time.    Current tube feeding regimen: 30ml Pro-stat daily, 140ml free water Q6, Jevity 1.2 cal @ 60ml/hr  Medications reviewed and include: Sensipar, ferrous sulfate, SSI, Novolog, Lantus, Miralax, Senokot-S  Labs reviewed. CBGs 56-173  UOP: 1,525ml x24 hours I/O: -15,185.4ml since 12/20/19  Diet Order:   Diet Order            Diet NPO time specified  Diet effective now              EDUCATION NEEDS:   No education needs have been identified at this time  Skin:  Skin Assessment: Skin Integrity Issues: Skin Integrity Issues:: DTI, Incisions DTI: R heel Incisions: abdomen  Last BM:  01/02/20  Height:   Ht Readings from Last 1 Encounters:  12/30/19 5' 7" (1.702 m)    Weight:   Wt Readings   from Last 1 Encounters:  01/03/20 82.4 kg    BMI:  Body mass index is 28.45 kg/m.  Estimated Nutritional Needs:   Kcal:  1700-1900  Protein:  85-100 grams  Fluid:  > 1.7 L    , MS, RD, LDN RD pager number and weekend/on-call pager number located in Amion.  

## 2020-01-03 NOTE — Progress Notes (Signed)
[place] PHYSICAL MEDICINE & REHABILITATION PROGRESS NOTE   Subjective/Complaints:     ROS: limited by cognition     Objective:   No results found. Recent Labs    01/02/20 0538  WBC 8.4  HGB 8.6*  HCT 28.0*  PLT 258   Recent Labs    01/02/20 0538  NA 141  K 3.6  CL 102  CO2 28  GLUCOSE 152*  BUN 20  CREATININE 1.07*  CALCIUM 8.3*    Intake/Output Summary (Last 24 hours) at 01/03/2020 0850 Last data filed at 01/03/2020 0700 Gross per 24 hour  Intake 0 ml  Output 1525 ml  Net -1525 ml     Physical Exam: Vital Signs Blood pressure (!) 152/65, pulse 71, temperature 97.6 F (36.4 C), temperature source Oral, resp. rate 18, height 5' 7"  (1.702 m), weight 82.4 kg, SpO2 100 %.     General: No acute distress Mood and affect are appropriate Heart: Regular rate and rhythm no rubs murmurs or extra sounds Lungs: Clear to auscultation, breathing unlabored, no rales or wheezes Abdomen: Positive bowel sounds, soft nontender to palpation, nondistended Extremities: No clubbing, cyanosis, or edema Skin: No evidence of breakdown, no evidence of rash Neurologic: Cranial nerves II through XII intact, motor strength is 5/5 in bilateral deltoid, bicep, tricep, grip, hip flexor, knee extensors, ankle dorsiflexor and plantar flexor     Skin: R lateral heel DTI seen ~ 1inch diameter    Assessment/Plan: 1. Functional deficits secondary to Left brainstem vs subcortical infarct which require 3+ hours per day of interdisciplinary therapy in a comprehensive inpatient rehab setting.  Physiatrist is providing close team supervision and 24 hour management of active medical problems listed below.  Physiatrist and rehab team continue to assess barriers to discharge/monitor patient progress toward functional and medical goals  Care Tool:  Bathing  Bathing activity did not occur: Safety/medical concerns Body parts bathed by patient: Face   Body parts bathed by helper: Right  arm, Left arm, Chest, Abdomen, Front perineal area, Buttocks, Right upper leg, Left upper leg, Right lower leg, Left lower leg     Bathing assist Assist Level: 2 Helpers     Upper Body Dressing/Undressing Upper body dressing   What is the patient wearing?: Pull over shirt    Upper body assist Assist Level: Total Assistance - Patient < 25%    Lower Body Dressing/Undressing Lower body dressing      What is the patient wearing?: Pants     Lower body assist Assist for lower body dressing: 2 Helpers     Toileting Toileting Toileting Activity did not occur (Clothing management and hygiene only): N/A (no void or bm)  Toileting assist Assist for toileting: Dependent - Patient 0%(3 helpers)     Transfers Chair/bed transfer  Transfers assist  Chair/bed transfer activity did not occur: Safety/medical concerns  Chair/bed transfer assist level: 2 Helpers     Locomotion Ambulation   Ambulation assist   Ambulation activity did not occur: Safety/medical concerns          Walk 10 feet activity   Assist  Walk 10 feet activity did not occur: Safety/medical concerns        Walk 50 feet activity   Assist Walk 50 feet with 2 turns activity did not occur: Safety/medical concerns         Walk 150 feet activity   Assist Walk 150 feet activity did not occur: Safety/medical concerns         Walk  10 feet on uneven surface  activity   Assist Walk 10 feet on uneven surfaces activity did not occur: Safety/medical concerns         Wheelchair     Assist Will patient use wheelchair at discharge?: Yes Type of Wheelchair: Manual Wheelchair activity did not occur: Safety/medical concerns         Wheelchair 50 feet with 2 turns activity    Assist    Wheelchair 50 feet with 2 turns activity did not occur: Safety/medical concerns       Wheelchair 150 feet activity     Assist  Wheelchair 150 feet activity did not occur: Safety/medical  concerns       Blood pressure (!) 152/65, pulse 71, temperature 97.6 F (36.4 C), temperature source Oral, resp. rate 18, height 5' 7"  (1.702 m), weight 82.4 kg, SpO2 100 %.    Medical Problem List and Plan: 1.  Rightt side hemiplegia, dysarthria, +/- aphasia secondary to left brain infarction.  MRI not completed due to pacemaker  -CT demonstrates hypoattenuation in left pons which is consistent with current presentation---increased right hp and increased difficulty with oro-pharyngeal control             -patient may shower             Plan is for SNF after CIR              Continue PT, OT, SLP  2.  Antithrombotics: -DVT/anticoagulation: Subcutaneous Lovenox             -antiplatelet therapy: Continue Aspirin 81 mg daily, Plavix 75 mg daily 3. Pain Management: ContinueTylenol as needed, pain is well controlled.  4. Mood with memory deficits.  Aricept 10 mg nightly             -antipsychotic agents: N/A 5. Neuropsych: This patient is NOT capable of making decisions on her own behalf. 6. Skin/Wound Care: Routine skin checks 7. Fluids/Electrolytes/Nutrition: Routine in and outs.  CMP ordered for tomorrow a.m.  3/13- ordered qmonday labs 8.  Hypertension.  Norvasc 2.5 mg daily, Coreg 3.125 mg twice daily.               Monitor with increased mobility Vitals:   01/02/20 2245 01/03/20 0426  BP: (!) 139/52 (!) 152/65  Pulse: 69 71  Resp:  18  Temp:  97.6 F (36.4 C)  SpO2:  100%   Remains mildly elevated increase amlodipine to 65m daily  9.  Diabetes mellitus with hyperglycemia.  Hemoglobin A1c 8.2.  Lantus insulin 10 units twice daily, NovoLog 2 units 3 times daily check blood sugars before meals and at bedtime              CBG (last 3)  Recent Labs    01/03/20 0425 01/03/20 0452 01/03/20 0801  GLUCAP 56* 119* 80  hypoglycemic but was NPO last noc for PEG , did receive lantus last noc, will give amp D50 as CBG dropping again, may need to run some D5 later in the day, hold  insulin today  10.  CAD with stenting as well as pacemaker.  Continue aspirin and Plavix. 11.  CKD stage IIIb.  Creatinine baseline 1.56.             Normalizing back to baseline  3/13- labs monday  12.  Chronic diastolic congestive heart failure.  Monitor for any signs of fluid overload             Daily weights  Filed Weights   12/31/19 0329 01/02/20 0451 01/03/20 0426  Weight: 82.3 kg 81.4 kg 82.4 kg   3/14- weight overall stable, however no weight today- will ask nursing.  13.  OSA.  CPAP. 14.  Hyperlipidemia.  Lipitor 15.  Chronic normocytic anemia.  Continue iron supplement             hgb 10 16.  Post stroke dysphagia: Dysphagia #2 thin liquids.  3/13- was on D2 thin lqiuids, but appears is now NPO- getting TFs by NGT- con't regimen and monitor- will need open to get PEG    Follow-up speech therapy.  Advance as tolerated  3/2: see diet/nutrition.  17.  UTI/Citrobacter/EColi:  Resolved    19. Hypercalcemia- ? Etiology, nephro following 20.  Severe Dysphagia, NPO-    IR to place PEG today     21. R heel DTI  3/13- might have been blister initially but now DTI- pic as above- will cover for standing activities and wear PRAFO when not standing otherwise.   22. Sedation- actually has improved. Brainstem and subocrtical infarcts typically do not cause RAS depression but may trial ritalin     23. Urinary retention  Severe stroke plus immobility +/- diabetic cystopathy  , may trial flomax /urecholine but given severe mobility issues not optimistic about effect  Will 24.  Stercoral ulcer LOS: 20 days A FACE TO FACE EVALUATION WAS PERFORMED  Charlett Blake 01/03/2020, 8:50 AM

## 2020-01-03 NOTE — Procedures (Signed)
Pre procedure Dx: Dysphagia Post Procedure Dx: Same  Successful fluoroscopic guided insertion of gastrostomy tube.   The gastrostomy tube may be used immediately for medications.   Tube feeds may be initiated in 24 hours as per the primary team.    EBL: None Complications: None immediate  Ronny Bacon, MD Pager #: [phone]

## 2020-01-03 NOTE — Progress Notes (Signed)
Speech Language Pathology Daily Session Note  Patient Details  Name: [name] MRN: [id number] Date of Birth: 25-Aug-1928  Today's Date: 01/03/2020 SLP Individual Time: 0830-0929 SLP Individual Time Calculation (min): 59 min  Short Term Goals: Week 3: SLP Short Term Goal 1 (Week 3): Pt will consume therapeutic trials of ice chips and/or thin liquids with efficient oral manipulation of bolus and timely swallow initiation with Max A multimodal cues. SLP Short Term Goal 2 (Week 3): Pt will increase speech intelligibility to 60% at the word level with Max A multimodal cues for use of increased vocal intensity. SLP Short Term Goal 3 (Week 3): Pt will sustain attentoin to functional tasks for 10 minute intervals with Mod A verbal/visual cues. SLP Short Term Goal 4 (Week 3): Pt will demonstrate problem solving during basic faimliar tasks with Mod A verbal/visual cues.  Skilled Therapeutic Interventions: Pt was seen for skilled ST targeting cognitive goals and secretion management. NO PO trials were attempted today due to plans for PEG procedure requiring NPO status today. Pt required hand over hand assistance and Max A verbal cues to complete basic self care tasks such as oral care and washing face. Max A verbal cues were provided for correcting anterior spillage of secretions by achieving closed resting oral posture and initiating swallow sequence. Pt was able to trigger volitional swallows with Max A verbal cues, however required considerable effort and suspect lingual pumping for ~10 seconds prior to initiation. Pt independently oriented to place, oriented to month with multiple choice cues and to situation with Min A verbal cues. Pt required Max A multimodal cues for problem solving and sustained attention to task during a basic calendar making activity. Pt left laying in bed with alarm set and needs within reach. Continue per current plan of care.        Pain Pain Assessment Pain Scale:  0-10 Pain Score: 0-No pain Faces Pain Scale: No hurt  Therapy/Group: Individual Therapy  Arbutus Leas 01/03/2020, 7:19 AM

## 2020-01-03 NOTE — Progress Notes (Signed)
  Hypoglycemic Event  CBG: 56  Treatment: D50 12.5 ml IV   Symptoms: sweaty  Follow-up CBG: Time:04:52 CBG Result:119  Possible Reasons for Event: pt  Comments/MD notified: yes    [name]

## 2020-01-04 ENCOUNTER — Inpatient Hospital Stay (HOSPITAL_COMMUNITY): Payer: Medicare Other | Admitting: Occupational Therapy

## 2020-01-04 ENCOUNTER — Inpatient Hospital Stay (HOSPITAL_COMMUNITY): Payer: Medicare Other | Admitting: Physical Therapy

## 2020-01-04 ENCOUNTER — Inpatient Hospital Stay (HOSPITAL_COMMUNITY): Payer: Medicare Other | Admitting: Speech Pathology

## 2020-01-04 LAB — GLUCOSE, CAPILLARY
Glucose-Capillary: 108 mg/dL — ABNORMAL HIGH (ref 70–99)
Glucose-Capillary: 86 mg/dL (ref 70–99)
Glucose-Capillary: 113 mg/dL — ABNORMAL HIGH (ref 70–99)
Glucose-Capillary: 46 mg/dL — ABNORMAL LOW (ref 70–99)
Glucose-Capillary: 113 mg/dL — ABNORMAL HIGH (ref 70–99)
Glucose-Capillary: 57 mg/dL — ABNORMAL LOW (ref 70–99)
Glucose-Capillary: 206 mg/dL — ABNORMAL HIGH (ref 70–99)

## 2020-01-04 MED ORDER — DEXTROSE 50 % IV SOLN
INTRAVENOUS | Status: AC
  Administered 2020-01-04: 50 mL via INTRAVENOUS
  Filled 2020-01-04: qty 50

## 2020-01-04 MED ORDER — JEVITY 1.5 CAL/FIBER PO LIQD
355.0000 mL | Freq: Three times a day (TID) | ORAL | Status: DC
  Administered 2020-01-04 – 2020-01-06 (×7): 355 mL
  Filled 2020-01-04: qty 474
  Filled 2020-01-04 (×2): qty 1000
  Filled 2020-01-04: qty 474
  Filled 2020-01-04: qty 1000
  Filled 2020-01-04 (×2): qty 474
  Filled 2020-01-04: qty 1000

## 2020-01-04 MED ORDER — FREE WATER
100.0000 mL | Freq: Three times a day (TID) | Status: DC
  Administered 2020-01-04 – 2020-01-06 (×7): 100 mL

## 2020-01-04 MED ORDER — CLOPIDOGREL BISULFATE 75 MG PO TABS
75.0000 mg | ORAL_TABLET | Freq: Every day | ORAL | Status: DC
  Administered 2020-01-04 – 2020-01-05 (×2): 75 mg via ORAL
  Filled 2020-01-04 (×2): qty 1

## 2020-01-04 NOTE — Progress Notes (Signed)
Occupational Therapy Session Note  Patient Details  Name: [name] MRN: [id number] Date of Birth: 1928-10-10  Today's Date: 01/04/2020 OT Individual Time: 7628-3151 OT Individual Time Calculation (min): 55 min    Short Term Goals: Week 3:  OT Short Term Goal 1 (Week 3): Pt will thread her affected arm into shirt sleeve with Mod A to improve Rt sided attention OT Short Term Goal 2 (Week 3): Pt will release grip on bedrail during functional bed mobility related to dressing or toileting with no more than mod multimodal cuing  Skilled Therapeutic Interventions/Progress Updates:    Upon entering the room, pt supine in bed with no c/o, signs, or symptoms of pain. Pt rolling with max A L <> R for OT to check hygiene needs and total A to don LB clothing. Max A supine > EOB with pt moving L LE off edge of bed. Pt sitting EOB with min A sitting balance. Slide board transfer with total A of 1 and steady assist of equipment from second person. Pt much more alert during session when upright. Pt required increased time and max cuing to close mouth and swallow saliva this session or therapist providing suction. OT assisted pt with oral care. She held suction brush with L UE but did not initiate oral cleaning on her own. Hand over hand assistance to put lotion on L UE with R. Min cuing to locate R UE and put lotion on with L. Pt verbalized yes or not when asking about amount of hair product needed for therapist to brush hair and secure with band. Lap tray placed onto tilt in space with soft call bell in lap and pt remains in wheelchair.   Therapy Documentation Precautions:  Precautions Precautions: Fall Restrictions Weight Bearing Restrictions: No General:   Vital Signs: Therapy Vitals Temp: 98.4 F (36.9 C) Temp Source: Oral Pulse Rate: 72 Resp: 20 BP: (!) 146/61 Patient Position (if appropriate): Sitting Oxygen Therapy SpO2: 94 % O2 Device: Room Air Pain: Pain Assessment Pain Scale:  0-10 Pain Score: 0-No pain   Therapy/Group: Individual Therapy  Gypsy Decant 01/04/2020, 12:02 PM
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
## 2020-01-04 NOTE — Progress Notes (Signed)
Nutrition Follow-up  DOCUMENTATION CODES:   Not applicable  INTERVENTION:   Initiate bolus feedings of: 355 ml Jevity 1.5 via PEG 4 times daily  50 ml free water flush before and after each feeding administration  Tube feeding regimen provides 2130 kcal (100% of needs), 91 grams of protein, and 1080 ml of H2O. Total free water: 1680 ml  NUTRITION DIAGNOSIS:   Inadequate oral intake related to inability to eat, dysphagia as evidenced by NPO status.  Ongoing  GOAL:   Patient will meet greater than or equal to 90% of their needs  Progressing   MONITOR:   Diet advancement, Labs, Weight trends, TF tolerance, Skin, I & O's  REASON FOR ASSESSMENT:   Consult Enteral/tube feeding initiation and management  ASSESSMENT:   [name] is an 86 year old right-handed female with history of CAD with stenting, CKD stage III with creatinine 1.56, CVA maintained on Plavix, memory loss maintained on Aricept followed by Dr. Jannifer Franklin, heart block with pacemaker, hypertension, type 2 diabetes mellitus, OSA with CPAP, chronic normocytic anemia, chronic diastolic congestive heart failure.  History taken from chart review due to cognition.  Patient lives with family.  1 level home.  Used a cane prior to admission for mobility and daughter assist with some basic ADLs.  She presented on 12/07/2019 after fall without LOC.  She was noted to have slurred speech and left hemiparesis.  Cranial CT scan unremarkable for acute intracranial process.  Patient did not receive TPA.  MRI not completed due to pacemaker.  Echocardiogram with ejection fraction of 55% without emboli.  Neurology follow-up currently maintained on aspirin and Plavix for CVA prophylaxis subcutaneous Lovenox for DVT prophylaxis.  Admission chemistries with creatinine 1.62, SARS coronavirus negative.  Hospital course further complicated by postop dysphagia, on a dysphagia 2 thin liquid diet.  Urinalysis study 12/12/2019 greater than 100,000  Citrobacter placed on Merrem 12/13/2019 for UTI.  Therapy evaluations completed and patient was admitted for a comprehensive rehab program.  Please see preadmission assessment from earlier today as well.  2/25- s/p BSE- diet downgraded from dysphagia2diet with thin liquids to dysphagia 1 diet with thin liquids, due to fatigue; head CT revealedsubacute left pontine infarct 2/26- s/p BSE- diet downgraded to dysphagia 1 with nectar thick liquids 3/1- s/p MBSS- recommend NPO, unsafe for PO intake at this time; NGT placed at bedside- tip of tube confirmed in stomach via KUB 3/2 - Cortrak (gastric) 3/12 - s/p sigmoidoscopy 3/16 - PEG placed  Reviewed I/O's: -1.9 L x 24 hours and -16.5 L since 12/21/19  UOP: 2.2 L x 24 hours  Pt in chair at time of visit. She did not arouse to voice or touch. Noted cortrak tube has been d/c.   PEG has been cleared for use by IR. MD requesting transition to bolus feeds.   Plan for d/c to SNF once medically ready.   Labs reviewed: CBGS: 85-113.   NUTRITION - FOCUSED PHYSICAL EXAM:    Most Recent Value  Orbital Region  No depletion  Upper Arm Region  No depletion  Thoracic and Lumbar Region  No depletion  Buccal Region  No depletion  Temple Region  No depletion  Clavicle Bone Region  No depletion  Clavicle and Acromion Bone Region  No depletion  Scapular Bone Region  No depletion  Dorsal Hand  No depletion  Patellar Region  No depletion  Anterior Thigh Region  No depletion  Posterior Calf Region  No depletion  Edema (RD Assessment)  Mild  Hair  Reviewed  Eyes  Reviewed  Mouth  Reviewed  Skin  Reviewed  Nails  Reviewed       Diet Order:   Diet Order            Diet NPO time specified  Diet effective now              EDUCATION NEEDS:   No education needs have been identified at this time  Skin:  Skin Assessment: Skin Integrity Issues: Skin Integrity Issues:: DTI, Incisions DTI: R heel Incisions: abdomen  Last BM:   01/04/20  Height:   Ht Readings from Last 1 Encounters:  12/30/19 5' 7"  (1.702 m)    Weight:   Wt Readings from Last 1 Encounters:  01/04/20 81.8 kg    Ideal Body Weight:  61.4 kg  BMI:  Body mass index is 28.24 kg/m.  Estimated Nutritional Needs:   Kcal:  1700-1900  Protein:  85-100 grams  Fluid:  > 1.7 L    Loistine Chance, RD, LDN, [place] Registered Dietitian II Certified Diabetes Care and Education Specialist Please refer to [facility] for RD and/or RD on-call/weekend/after hours pager

## 2020-01-04 NOTE — Progress Notes (Signed)
Patient vomit x 1 @ 0950, about 50cc yellow/thin. Doctor who rounded for peg tube aware. PRN zofran given at 0954 along with scheduled medications through peg tube approved. CBG check at 1212 113, no CBG coverage at this time. Scheduled 6units given. CBG check @ 1702 46. Daughter rages out about 6 units given and that staff should not have given 6 units of scheduled coverage. Charge nurse mekides RN called to room to further explain why it was given. Patient daughter begin to put her hands up clapping with rude tone of voice at nurse. Begins to get louder and louder. Bolus feed given through peg tube at this time to help raise CBG. Daughter continues to tell staff in rude tone that we need to call doctor to room to discontinue scheduled coverage. Re-check CBG @ 1736 is 57. Dan, PA called to notify of situation and verbally discontinues scheduled dose of 6units Q4 hours. Okay to give dextrose 50% solution 47m as daughter requested we did something else to raise CBG. Recheck CBG @ 1807, 113. Daughter has now begin to be calm of situation. Daughter requests pain medication, which has already been given. Daughter aware and is okay with waiting for next dose to be given. Daughter is still at bedside, no s/s of distress noted at this time. No further complications to report. Continue plan of care. AAudie Clear LPN

## 2020-01-04 NOTE — Progress Notes (Signed)
Referring Physician(s): Dr. Markus Daft  Supervising Physician: Markus Daft  Patient Status:  [facility] - In-pt  Chief Complaint: Dysphagia  Subjective: Patient resting comfortably after PT.  G-tube in place.  No pain response when abdomen palpated.  Mention of hematemesis overnight x1 per RN, but none since.   Allergies: Patient has no known allergies.  Medications: Prior to Admission medications   Medication Sig Start Date End Date Taking? Authorizing Provider  amLODipine (NORVASC) 2.5 MG tablet Take 2.5 mg by mouth every morning.    [provider]  aspirin EC 81 MG EC tablet Take 1 tablet (81 mg total) by mouth daily. 12/15/19   Charlynne Cousins, MD  atorvastatin (LIPITOR) 80 MG tablet Take 80 mg by mouth every morning.  04/17/15   [provider]  brimonidine-timolol (COMBIGAN) 0.2-0.5 % ophthalmic solution Place 1 drop into both eyes 2 (two) times daily.    [provider]  calcitRIOL (ROCALTROL) 0.25 MCG capsule Take 0.25 mcg by mouth every morning.    [provider]  carvedilol (COREG) 3.125 MG tablet Take 3.125 mg by mouth 2 (two) times daily. 04/17/15   [provider]  clopidogrel (PLAVIX) 75 MG tablet Take 1 tablet (75 mg total) by mouth daily. 07/03/19   Thurnell Lose, MD  Cod Liver Oil CAPS Take 1 capsule by mouth at bedtime.     [provider]  donepezil (ARICEPT) 10 MG tablet Take 10 mg by mouth at bedtime.  11/07/19   [provider]  dorzolamide (TRUSOPT) 2 % ophthalmic solution Place 1 drop into both eyes 2 (two) times daily.  07/28/13   [provider]  ferrous sulfate 325 (65 FE) MG tablet Take 325 mg by mouth daily with breakfast.    [provider]  insulin degludec (TRESIBA FLEXTOUCH) 100 UNIT/ML SOPN FlexTouch Pen Inject 10-20 Units into the skin See admin instructions. Inject 10-20 units into the skin at bedtime, based upon CGB (home capillary blood glucose)    [provider]  Insulin Lispro (HUMALOG KWIKPEN) 200 UNIT/ML SOPN Inject 6 Units into the skin 3 (three) times daily as needed (high blood sugar).     [provider]  latanoprost (XALATAN) 0.005 % ophthalmic solution Appointment OVERDUE, place 1 drop into both eyes daily at bedtime Patient taking differently: Place 1 drop into both eyes at bedtime.  03/02/14   Blanchie Serve, MD  linagliptin (TRADJENTA) 5 MG TABS tablet Take 5 mg by mouth every morning.    [provider]  loperamide (IMODIUM) 2 MG capsule Take 2 mg by mouth as needed for diarrhea or loose stools.    [provider]  Multiple Vitamin (MULTIVITAMIN WITH MINERALS) TABS Take 1 tablet by mouth at bedtime.     [provider]  Multiple Vitamins-Minerals (EYE VITAMINS) CAPS Take 1 capsule by mouth every morning.    [provider]  nitroGLYCERIN (NITROSTAT) 0.4 MG SL tablet Place 0.4 mg under the tongue every 5 (five) minutes as needed for chest pain.  09/03/12   Charolette Forward, MD  NOVOFINE 32G X 6 MM MISC  01/25/13   [provider]  pantoprazole (PROTONIX) 40 MG tablet Take 40 mg by mouth daily before breakfast.  05/07/15   [provider]  valsartan-hydrochlorothiazide (DIOVAN-HCT) 320-12.5 MG tablet Take 1 tablet by mouth every morning.     [provider]     Vital Signs: BP (!) 130/43 (BP Location: Right Arm)   Pulse  77   Temp 98.7 F (37.1 C) (Oral)   Resp 17   Ht 5' 7"  (1.702 m)   Wt 180 lb 5.4 oz (81.8 kg)   SpO2 98%   BMI 28.24 kg/m   Physical Exam  NAD, arousable Abdomen: G-tube intact.  Site clean and dry.  No oozing, bleeding. Non-tender.   Imaging: DG Abd 1 View  Result Date: 01/03/2020 CLINICAL DATA:  Evaluate barium prior to potential percutaneous gastrostomy tube placement. EXAM: ABDOMEN - 1 VIEW COMPARISON:  CT abdomen pelvis-12/29/2019 FINDINGS: Enteric contrast is seen throughout the colon. No evidence of enteric obstruction. Enteric  tube tip overlies expected location the gastric antrum. Nondiagnostic evaluation for pneumoperitoneum secondary to supine positioning and exclusion of the lower thorax. No pneumatosis or portal venous gas. Multiple calcified fibroids overlie the pelvis as demonstrated on previous abdominal CT. No acute osseous abnormalities. IMPRESSION: Enteric contrast seen throughout the colon. No evidence of enteric obstruction. Electronically Signed   By: Sandi Mariscal M.D.   On: 01/03/2020 09:33   IR GASTROSTOMY TUBE MOD SED  Result Date: 01/03/2020 INDICATION: Dysphagia. Please perform percutaneous gastrostomy tube placement for enteric nutrition supplementation purposes. EXAM: PULL TROUGH GASTROSTOMY TUBE PLACEMENT COMPARISON:  CT abdomen pelvis-12/28/2019; abdominal radiograph-earlier same day MEDICATIONS: Ancef 2 gm IV; Antibiotics were administered within 1 hour of the procedure. Glucagon 1 mg IV CONTRAST:  10 mL of Omnipaque 300 administered into the gastric lumen. ANESTHESIA/SEDATION: Moderate (conscious) sedation was employed during this procedure. A total of Versed 0.5 mg and Fentanyl 25 mcg was administered intravenously. Moderate Sedation Time: 10 minutes. The patient's level of consciousness and vital signs were monitored continuously by radiology nursing throughout the procedure under my direct supervision. FLUOROSCOPY TIME:  3 minutes, 42 seconds (69.6 mGy) COMPLICATIONS: None immediate. PROCEDURE: Informed written consent was obtained from the patient following explanation of the procedure, risks, benefits and alternatives. A time out was performed prior to the initiation of the procedure. Ultrasound scanning was performed to demarcate the edge of the left lobe of the liver. Maximal barrier sterile technique utilized including caps, mask, sterile gowns, sterile gloves, large sterile drape, hand hygiene and Betadine prep. The left upper quadrant was sterilely prepped and draped. An oral gastric catheter was  inserted into the stomach under fluoroscopy. The existing nasogastric feeding tube was removed. The left costal margin and barium/air opacified transverse colon were identified and avoided. Air was injected into the stomach for insufflation and visualization under fluoroscopy. Under sterile conditions a 17 gauge trocar needle was utilized to access the stomach percutaneously beneath the left subcostal margin after the overlying soft tissues were anesthetized with 1% Lidocaine with epinephrine. Needle position was confirmed within the stomach with aspiration of air and injection of small amount of contrast. A single T tack was deployed for gastropexy. Over an Amplatz guide wire, a 9-French sheath was inserted into the stomach. A snare device was utilized to capture the oral gastric catheter. The snare device was pulled retrograde from the stomach up the esophagus and out the oropharynx. The 20-French pull-through gastrostomy was connected to the snare device and pulled antegrade through the oropharynx down the esophagus into the stomach and then through the percutaneous tract external to the patient. The gastrostomy was assembled externally. Contrast injection confirms position in the stomach. Several spot radiographic images were obtained in various obliquities for documentation. The patient tolerated procedure well without immediate post procedural complication. FINDINGS: After successful fluoroscopic guided placement, the gastrostomy tube is appropriately positioned with internal  disc against the ventral aspect of the gastric lumen. IMPRESSION: Successful fluoroscopic insertion of a 20-French pull-through gastrostomy tube. The gastrostomy may be used immediately for medication administration and in 24 hrs for the initiation of feeds. Electronically Signed   By: Sandi Mariscal M.D.   On: 01/03/2020 11:34    Labs:  CBC: Recent Labs    12/22/19 0648 12/23/19 0552 12/26/19 0518 01/02/20 0538  WBC 11.3* 11.1*  10.3 8.4  HGB 9.5* 9.5* 9.3* 8.6*  HCT 30.6* 30.5* 30.4* 28.0*  PLT 222 227 190 258    COAGS: Recent Labs    07/01/19 1852 07/01/19 1852 10/09/19 1719 10/12/19 1952 12/06/19 1650 01/03/20 0459  INR 1.0   < > 0.9 1.0 1.0 1.1  APTT 35  --  31 32 32  --    < > = values in this interval not displayed.    BMP: Recent Labs    12/24/19 0534 12/25/19 0521 12/26/19 0518 01/02/20 0538  NA 142 141 139 141  K 4.2 3.9 3.8 3.6  CL 104 104 103 102  CO2 30 29 30 28   GLUCOSE 312* 287* 238* 152*  BUN 21 20 23 20   CALCIUM 11.2* 10.8* 9.9 8.3*  CREATININE 1.18* 1.15* 1.21* 1.07*  GFRNONAA 41* 42* 40* 46*  GFRAA 47* 49* 46* 53*    LIVER FUNCTION TESTS: Recent Labs    11/04/19 1417 11/04/19 1417 12/06/19 1650 12/06/19 1650 12/15/19 0643 12/15/19 1740 12/18/19 0951 12/22/19 0648 12/23/19 0552 12/24/19 0534 12/25/19 0521 12/26/19 0518  BILITOT 0.6  --  0.7  --  0.2*  --  0.6  --   --   --   --   --   AST 24  --  24  --  38  --  27  --   --   --   --   --   ALT 21  --  27  --  33  --  27  --   --   --   --   --   ALKPHOS 61  --  67  --  61  --  69  --   --   --   --   --   PROT 6.7  --  7.0  --  6.5  --  6.9  --   --   --   --   --   ALBUMIN 3.6   < > 3.7   < > 3.1*   < > 3.2*   < > 2.4* 2.6* 2.5* 2.5*   < > = values in this interval not displayed.    Assessment and Plan: Dysphagia s/p gastrostomy tube placement 01/03/20 Patient s/p tube placement yesterday.  1 episode of hematemesis overnight, but none since.   Abdomen non-tender.  Vital signs stable.  Ok to use.   Electronically Signed: Docia Barrier, PA 01/04/2020, 3:07 PM   I spent a total of 15 Minutes at the the patient's bedside AND on the patient's hospital floor or unit, greater than 50% of which was counseling/coordinating care for dysphagia.

## 2020-01-04 NOTE — Progress Notes (Signed)
Speech Language Pathology Daily Session Note  Patient Details  Name: [name] MRN: [id number] Date of Birth: 11-03-1927  Today's Date: 01/04/2020 SLP Individual Time: 0677-0340 SLP Individual Time Calculation (min): 58 min  Short Term Goals: Week 3: SLP Short Term Goal 1 (Week 3): Pt will consume therapeutic trials of ice chips and/or thin liquids with efficient oral manipulation of bolus and timely swallow initiation with Max A multimodal cues. SLP Short Term Goal 2 (Week 3): Pt will increase speech intelligibility to 60% at the word level with Max A multimodal cues for use of increased vocal intensity. SLP Short Term Goal 3 (Week 3): Pt will sustain attentoin to functional tasks for 10 minute intervals with Mod A verbal/visual cues. SLP Short Term Goal 4 (Week 3): Pt will demonstrate problem solving during basic faimliar tasks with Mod A verbal/visual cues.  Skilled Therapeutic Interventions: Pt was seen for skilled ST targeting dysphgia and cognitive goals. Pt was minimally verbal today. She exhibited immediate cough or wet vocal quality in 50% ice chip trials (after oral care). She also required step by step directions to orally manipulate and masticate ice, which was observed to still be very weak and minimal. Pt still requiring Max A verbal and tactile cues to manage secretions, but exhibited 2 instances in which she sensed anterior spillage and use a washrag to wipe her lips.  During a basic picture category matching task, pt required Max A for problem solving and error awareness, Mod A for redirection to task in a quiet environment. Max A also required during visual scanning tasks, as well as locating therapist on left side of environment throughout session. Pt left laying in bed with alarm set, needs within reach. Continue per current plan of care.       Pain Pain Assessment Pain Scale: 0-10 Pain Score: 0-No pain  Therapy/Group: Individual Therapy  Arbutus Leas 01/04/2020,  7:29 AM

## 2020-01-04 NOTE — Progress Notes (Signed)
After administration of medication and mouth care patient began to vomit a moderate amount of brown emesis. One time occurrence. Pt resting at this time, no other concerns to report.

## 2020-01-04 NOTE — Progress Notes (Signed)
[place] PHYSICAL MEDICINE & REHABILITATION PROGRESS NOTE   Subjective/Complaints:  Underwent successful placement of PEG in IR  No new breathing issues post procdure, denies abd pain  Pt remains lethargic     ROS: limited by cognition     Objective:   DG Abd 1 View  Result Date: 01/03/2020 CLINICAL DATA:  Evaluate barium prior to potential percutaneous gastrostomy tube placement. EXAM: ABDOMEN - 1 VIEW COMPARISON:  CT abdomen pelvis-12/29/2019 FINDINGS: Enteric contrast is seen throughout the colon. No evidence of enteric obstruction. Enteric tube tip overlies expected location the gastric antrum. Nondiagnostic evaluation for pneumoperitoneum secondary to supine positioning and exclusion of the lower thorax. No pneumatosis or portal venous gas. Multiple calcified fibroids overlie the pelvis as demonstrated on previous abdominal CT. No acute osseous abnormalities. IMPRESSION: Enteric contrast seen throughout the colon. No evidence of enteric obstruction. Electronically Signed   By: Sandi Mariscal M.D.   On: 01/03/2020 09:33   IR GASTROSTOMY TUBE MOD SED  Result Date: 01/03/2020 INDICATION: Dysphagia. Please perform percutaneous gastrostomy tube placement for enteric nutrition supplementation purposes. EXAM: PULL TROUGH GASTROSTOMY TUBE PLACEMENT COMPARISON:  CT abdomen pelvis-12/28/2019; abdominal radiograph-earlier same day MEDICATIONS: Ancef 2 gm IV; Antibiotics were administered within 1 hour of the procedure. Glucagon 1 mg IV CONTRAST:  10 mL of Omnipaque 300 administered into the gastric lumen. ANESTHESIA/SEDATION: Moderate (conscious) sedation was employed during this procedure. A total of Versed 0.5 mg and Fentanyl 25 mcg was administered intravenously. Moderate Sedation Time: 10 minutes. The patient's level of consciousness and vital signs were monitored continuously by radiology nursing throughout the procedure under my direct supervision. FLUOROSCOPY TIME:  3 minutes, 42 seconds (76.7  mGy) COMPLICATIONS: None immediate. PROCEDURE: Informed written consent was obtained from the patient following explanation of the procedure, risks, benefits and alternatives. A time out was performed prior to the initiation of the procedure. Ultrasound scanning was performed to demarcate the edge of the left lobe of the liver. Maximal barrier sterile technique utilized including caps, mask, sterile gowns, sterile gloves, large sterile drape, hand hygiene and Betadine prep. The left upper quadrant was sterilely prepped and draped. An oral gastric catheter was inserted into the stomach under fluoroscopy. The existing nasogastric feeding tube was removed. The left costal margin and barium/air opacified transverse colon were identified and avoided. Air was injected into the stomach for insufflation and visualization under fluoroscopy. Under sterile conditions a 17 gauge trocar needle was utilized to access the stomach percutaneously beneath the left subcostal margin after the overlying soft tissues were anesthetized with 1% Lidocaine with epinephrine. Needle position was confirmed within the stomach with aspiration of air and injection of small amount of contrast. A single T tack was deployed for gastropexy. Over an Amplatz guide wire, a 9-French sheath was inserted into the stomach. A snare device was utilized to capture the oral gastric catheter. The snare device was pulled retrograde from the stomach up the esophagus and out the oropharynx. The 20-French pull-through gastrostomy was connected to the snare device and pulled antegrade through the oropharynx down the esophagus into the stomach and then through the percutaneous tract external to the patient. The gastrostomy was assembled externally. Contrast injection confirms position in the stomach. Several spot radiographic images were obtained in various obliquities for documentation. The patient tolerated procedure well without immediate post procedural  complication. FINDINGS: After successful fluoroscopic guided placement, the gastrostomy tube is appropriately positioned with internal disc against the ventral aspect of the gastric lumen. IMPRESSION: Successful fluoroscopic insertion  of a 20-French pull-through gastrostomy tube. The gastrostomy may be used immediately for medication administration and in 24 hrs for the initiation of feeds. Electronically Signed   By: Sandi Mariscal M.D.   On: 01/03/2020 11:34   Recent Labs    01/02/20 0538  WBC 8.4  HGB 8.6*  HCT 28.0*  PLT 258   Recent Labs    01/02/20 0538  NA 141  K 3.6  CL 102  CO2 28  GLUCOSE 152*  BUN 20  CREATININE 1.07*  CALCIUM 8.3*    Intake/Output Summary (Last 24 hours) at 01/04/2020 0834 Last data filed at 01/04/2020 0559 Gross per 24 hour  Intake 347 ml  Output 2223 ml  Net -1876 ml     Physical Exam: Vital Signs Blood pressure (!) 136/59, pulse 74, temperature 98.9 F (37.2 C), temperature source Oral, resp. rate 18, height 5' 7"  (1.702 m), weight 81.8 kg, SpO2 96 %.     General: No acute distress Mood and affect are appropriate Heart: Regular rate and rhythm no rubs murmurs or extra sounds Lungs: Clear to auscultation, breathing unlabored, no rales or wheezes Abdomen: Positive bowel sounds, soft nontender to palpation, nondistended Extremities: No clubbing, cyanosis, or edema Skin: No evidence of breakdown, no evidence of rash Neurologic: Cranial nerves II through XII intact, motor strength is 4/5 in RIght and trace Left l deltoid, bicep, tricep, grip, hip flexor, knee extensors, ankle dorsiflexor and plantar flexor + left neglect     Skin: R lateral heel DTI seen ~ 1inch diameter    Assessment/Plan: 1. Functional deficits secondary to Left brainstem vs subcortical infarct which require 3+ hours per day of interdisciplinary therapy in a comprehensive inpatient rehab setting.  Physiatrist is providing close team supervision and 24 hour management  of active medical problems listed below.  Physiatrist and rehab team continue to assess barriers to discharge/monitor patient progress toward functional and medical goals  Care Tool:  Bathing  Bathing activity did not occur: Safety/medical concerns Body parts bathed by patient: Face   Body parts bathed by helper: Right arm, Left arm, Chest, Abdomen, Front perineal area, Buttocks, Right upper leg, Left upper leg, Right lower leg, Left lower leg     Bathing assist Assist Level: 2 Helpers     Upper Body Dressing/Undressing Upper body dressing   What is the patient wearing?: Pull over shirt    Upper body assist Assist Level: Total Assistance - Patient < 25%    Lower Body Dressing/Undressing Lower body dressing      What is the patient wearing?: Pants     Lower body assist Assist for lower body dressing: 2 Helpers     Toileting Toileting Toileting Activity did not occur (Clothing management and hygiene only): N/A (no void or bm)  Toileting assist Assist for toileting: Dependent - Patient 0%(3 helpers)     Transfers Chair/bed transfer  Transfers assist  Chair/bed transfer activity did not occur: Safety/medical concerns  Chair/bed transfer assist level: 2 Helpers     Locomotion Ambulation   Ambulation assist   Ambulation activity did not occur: Safety/medical concerns          Walk 10 feet activity   Assist  Walk 10 feet activity did not occur: Safety/medical concerns        Walk 50 feet activity   Assist Walk 50 feet with 2 turns activity did not occur: Safety/medical concerns         Walk 150 feet activity   Assist  Walk 150 feet activity did not occur: Safety/medical concerns         Walk 10 feet on uneven surface  activity   Assist Walk 10 feet on uneven surfaces activity did not occur: Safety/medical concerns         Wheelchair     Assist Will patient use wheelchair at discharge?: Yes Type of Wheelchair:  Manual Wheelchair activity did not occur: Safety/medical concerns         Wheelchair 50 feet with 2 turns activity    Assist    Wheelchair 50 feet with 2 turns activity did not occur: Safety/medical concerns       Wheelchair 150 feet activity     Assist  Wheelchair 150 feet activity did not occur: Safety/medical concerns       Blood pressure (!) 136/59, pulse 74, temperature 98.9 F (37.2 C), temperature source Oral, resp. rate 18, height 5' 7"  (1.702 m), weight 81.8 kg, SpO2 96 %.    Medical Problem List and Plan: 1.  Rightt side hemiplegia, dysarthria, +/- aphasia secondary to left brain infarction.  MRI not completed due to pacemaker  -CT demonstrates hypoattenuation in left pons which is consistent with current presentation---increased right hp and increased difficulty with oro-pharyngeal control             -patient may shower             Plan is for SNF after CIR  Team conference today please see physician documentation under team conference tab, met with team  to discuss problems,progress, and goals. Formulized individual treatment plan based on medical history, underlying problem and comorbidities.            Continue PT, OT, SLP  2.  Antithrombotics: -DVT/anticoagulation: Subcutaneous Lovenox             -antiplatelet therapy: Continue Aspirin 81 mg daily, Plavix 75 mg daily- may resume post PEG  3. Pain Management: ContinueTylenol as needed, pain is well controlled.  4. Mood with memory deficits.  Aricept 10 mg nightly             -antipsychotic agents: N/A 5. Neuropsych: This patient is NOT capable of making decisions on her own behalf. 6. Skin/Wound Care: Routine skin checks 7. Fluids/Electrolytes/Nutrition: Routine in and outs.  CMP ordered for tomorrow a.m.  3/13- ordered qmonday labs 8.  Hypertension.  Norvasc 2.5 mg daily, Coreg 3.125 mg twice daily.               Monitor with increased mobility Vitals:   01/03/20 1952 01/04/20 0432  BP: 120/85 (!)  136/59  Pulse: 73 74  Resp:  18  Temp: 98.4 F (36.9 C) 98.9 F (37.2 C)  SpO2: 98% 96%   Remains mildly elevated increase amlodipine to 60m daily  9.  Diabetes mellitus with hyperglycemia.  Hemoglobin A1c 8.2.  Lantus insulin 10 units twice daily, NovoLog 2 units 3 times daily check blood sugars before meals and at bedtime              CBG (last 3)  Recent Labs    01/03/20 2101 01/04/20 0121 01/04/20 0427  GLUCAP 88 108* 86  hypoglycemic but was NPO last noc for PEG , did receive lantus last noc, will give amp D50 as CBG dropping again, may need to run some D5 later in the day, hold insulin today  10.  CAD with stenting as well as pacemaker.  Continue aspirin and Plavix. 11.  CKD stage  IIIb.  Creatinine baseline 1.56.             Normalizing back to baseline  3/13- labs monday  12.  Chronic diastolic congestive heart failure.  Monitor for any signs of fluid overload             Daily weights   Filed Weights   01/02/20 0451 01/03/20 0426 01/04/20 0434  Weight: 81.4 kg 82.4 kg 81.8 kg   3/14- weight overall stable, however no weight today- will ask nursing.  13.  OSA.  CPAP. 14.  Hyperlipidemia.  Lipitor 15.  Chronic normocytic anemia.  Continue iron supplement             hgb 10 16.  Post stroke dysphagia: Dysphagia #2 thin liquids.  3/13- was on D2 thin lqiuids, but appears is now NPO- getting TFs by [facility]- con't regimen and monitor- will need open to get PEG    Follow-up speech therapy.  Advance as tolerated  3/2: see diet/nutrition.  17.  UTI/Citrobacter/EColi:  Resolved    19. Hypercalcemia- ? Etiology, nephro following 20.  Severe Dysphagia, NPO-    POD #1 post PEG    21. R heel DTI  3/13- might have been blister initially but now DTI- pic as above- will cover for standing activities and wear PRAFO when not standing otherwise.   22. Sedation- actually has improved. Brainstem and subocrtical infarcts typically do not cause RAS depression but may trial ritalin      23. Urinary retention  Severe stroke plus immobility +/- diabetic cystopathy  , may trial flomax /urecholine but given severe mobility issues not optimistic about effect   24.  Stercoral ulcer avoid constipation  LOS: 21 days A FACE TO FACE EVALUATION WAS PERFORMED  Charlett Blake 01/04/2020, 8:34 AM

## 2020-01-04 NOTE — Progress Notes (Signed)
Physical Therapy Session Note  Patient Details  Name: [name] MRN: [id number] Date of Birth: April 16, 1928  Today's Date: 01/04/2020 PT Individual Time: 1300-1400 PT Individual Time Calculation (min): 60 min   Short Term Goals: Week 3:  PT Short Term Goal 1 (Week 3): Pt will perform bed mobility with max assist PT Short Term Goal 2 (Week 3): Pt will transfer to [facility] with mod assist PT Short Term Goal 3 (Week 3): Pt will sustain standing in parallel bars up to 45 seconds.  Skilled Therapeutic Interventions/Progress Updates:   Pt received sitting in WC and agreeable to PT. Pt transported to rail outside rehab gym. Sit<>stand x 2 with mod assist overall from PT with RUE over therapist shoulder. Sustained standing balance/ tolerance 1 min +30sec with multimodal cues to trunk extension, knee extension on the R, and midline orientation with use of mirror for visual feedback. While in standing, pt noted to incontinent stool. Pt transported back to room in [facility].   Stand pivot transfer to Bed with mod-max assist with RUE over therapist shoulder and LUE supported on bed rail. Sit>supine with +2 assist for time management. Rolling R and L with with min assist  To the R to position the RLE, and mod assist to the L. PT performed clothing management and pericare with +2 present for safety to reduce pain in the R hip while in sidelying.   Sit>supine with mod-max assist and moderate cues for proper use of LUE to push into sitting. SB transfer to [facility] with mod-max assist and max multimodal cues for UE placement and initiation of movement. [place]ath facilitation to improve anterior weight shifting as well as R knee blocked to improve symmetry of WB through BLE.   Patient left sitting in [facility] with call bell in reach and all needs met.         Therapy Documentation Precautions:  Precautions Precautions: Fall Restrictions Weight Bearing Restrictions: No General: PT Amount of Missed Time (min): 15 Minutes  fatigue   Pain:   denies   Therapy/Group: Individual Therapy  Lorie Phenix 01/04/2020, 2:19 PM

## 2020-01-05 ENCOUNTER — Inpatient Hospital Stay (HOSPITAL_COMMUNITY): Payer: Medicare Other | Admitting: Occupational Therapy

## 2020-01-05 ENCOUNTER — Inpatient Hospital Stay (HOSPITAL_COMMUNITY): Payer: Medicare Other | Admitting: Speech Pathology

## 2020-01-05 ENCOUNTER — Inpatient Hospital Stay (HOSPITAL_COMMUNITY): Payer: Medicare Other | Admitting: Physical Therapy

## 2020-01-05 LAB — GLUCOSE, CAPILLARY
Glucose-Capillary: 126 mg/dL — ABNORMAL HIGH (ref 70–99)
Glucose-Capillary: 206 mg/dL — ABNORMAL HIGH (ref 70–99)
Glucose-Capillary: 208 mg/dL — ABNORMAL HIGH (ref 70–99)
Glucose-Capillary: 268 mg/dL — ABNORMAL HIGH (ref 70–99)
Glucose-Capillary: 271 mg/dL — ABNORMAL HIGH (ref 70–99)
Glucose-Capillary: 99 mg/dL (ref 70–99)

## 2020-01-05 MED ORDER — BETHANECHOL CHLORIDE 25 MG PO TABS
25.0000 mg | ORAL_TABLET | Freq: Three times a day (TID) | ORAL | Status: DC
  Administered 2020-01-05 – 2020-01-06 (×3): 25 mg
  Filled 2020-01-05 (×3): qty 1

## 2020-01-05 MED ORDER — SORBITOL 70 % SOLN: 30.0000 mL | Freq: Every day | Status: DC | PRN

## 2020-01-05 MED ORDER — TRAMADOL HCL 50 MG PO TABS: 50.0000 mg | ORAL_TABLET | Freq: Two times a day (BID) | ORAL | Status: DC | PRN

## 2020-01-05 MED ORDER — FERROUS SULFATE 300 (60 FE) MG/5ML PO SYRP
300.0000 mg | ORAL_SOLUTION | Freq: Every day | ORAL | Status: DC
  Administered 2020-01-06 – 2020-01-10 (×5): 300 mg
  Filled 2020-01-05 (×5): qty 5

## 2020-01-05 MED ORDER — TRAMADOL HCL 50 MG PO TABS
50.0000 mg | ORAL_TABLET | Freq: Two times a day (BID) | ORAL | Status: DC | PRN
  Administered 2020-01-06 – 2020-01-10 (×5): 50 mg
  Filled 2020-01-05 (×6): qty 1

## 2020-01-05 MED ORDER — CLOPIDOGREL BISULFATE 75 MG PO TABS
75.0000 mg | ORAL_TABLET | Freq: Every day | ORAL | Status: DC
  Administered 2020-01-06 – 2020-01-10 (×5): 75 mg
  Filled 2020-01-05 (×5): qty 1

## 2020-01-05 NOTE — Progress Notes (Signed)
Verified with pharmacy, Senispar can not be crushed and pt is NPO. Dose held and will reach out to MD.

## 2020-01-05 NOTE — Progress Notes (Signed)
Team Conference Report to Patient/Family  Team Conference discussion was reviewed with the patient's daughter, grand daughter and grandson, including goals for mod-max assist, any changes in plan of care with d/c to SNF.  Patient's family expressed understanding and are in agreement.  The patient has a target discharge date of (Plan is for SNF placement at [place]). Reviewed preferences for SNF facilities and updated information with TF and swallowing.  Dorien Chihuahua B 01/05/2020, 2:15 PM

## 2020-01-05 NOTE — Progress Notes (Addendum)
Pt very solemn today. Awakes when calls name or touches shoulder but doesn't stay awake for more than 5 minutes at a time. Tylenol x2 and zofran given today. Pt points to her stomach. Dressing changed under PEG tube. No redness or drainage but PEG is very tight against skin. No emesis and HOB kept >30degrees whenever possible.  Gave 357m x3 today as ordered, attempted to give bolus feeding more slowly to see if helps but not much difference throughout the day. At this time pt moaning and grimacing in pain, another dose of tylenol given.

## 2020-01-05 NOTE — Progress Notes (Signed)
Physical Therapy Session Note  Patient Details  Name: [name] MRN: [id number] Date of Birth: September 05, 1928  Today's Date: 01/05/2020 PT Individual Time: 1100-1200 PT Individual Time Calculation (min): 60 min   Short Term Goals: Week 3:  PT Short Term Goal 1 (Week 3): Pt will perform bed mobility with max assist PT Short Term Goal 2 (Week 3): Pt will transfer to [facility] with mod assist PT Short Term Goal 3 (Week 3): Pt will sustain standing in parallel bars up to 45 seconds.  Skilled Therapeutic Interventions/Progress Updates:    Pt received supine in bed and agreeable to PT. Rolling R and L with mod assist. Pt noted to be incontinent of bowel. Pt assisted with pericare in sidelying with total A. Supine>sit transfer with max assist of 1. Sitting balance EOB with lateral lean to the R to complete pulling pants to waist. Max assist to maintain balance while WB through R elbow. Supervision assist sitting EOB x 5 minutes with LUE supported on bed and bed rail intermittently. Only cues required to correct R lean. SB transfer to [facility] with max assist +2 for safety to stabilize SB. Pt transported to rail in hall. Sit<>stand x 2 with max assist for anterior weight shift and blocking the RLE from buckling.  Gait training at rail x 3 ft with max cues for posture/UE placement, limb placement with swing and terminal knee extension on the R. Patient returned to room and left sitting in [facility] with call bell in reach and all needs met.         Therapy Documentation Precautions:  Precautions Precautions: Fall Restrictions Weight Bearing Restrictions: No    Vital Signs: Therapy Vitals Temp: 98.2 F (36.8 C) Temp Source: Oral Pulse Rate: 71 Resp: 20 BP: 112/89 Patient Position (if appropriate): Lying Oxygen Therapy SpO2: 100 % O2 Device: Room Air Pain: Denies    Therapy/Group: Individual Therapy  Lorie Phenix 01/05/2020, 4:02 PM

## 2020-01-05 NOTE — Progress Notes (Signed)
Team Conference Report to Patient/Family  Team Conference discussion was reviewed with the patient's daughter Caren Griffins and grand daughter Kenney Houseman, including goals, any changes in plan of care and plan for SNF search.  Patient's daughter and grand-daughter expressed understanding and are in agreement.  The patient's family are aware that a discharge date  Will depend on bed availability at the SNF.  Dorien Chihuahua B 01/05/2020, 2:21 PM
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
## 2020-01-05 NOTE — Progress Notes (Signed)
Speech Language Pathology Weekly Progress and Session Note  Patient Details  Name: [name] MRN: [id number] Date of Birth: 06/19/1928  Beginning of progress report period: December 29, 2019 End of progress report period: January 05, 2020  Today's Date: 01/05/2020 SLP Individual Time: 1937-9024 SLP Individual Time Calculation (min): 33 min  Short Term Goals: Week 3: SLP Short Term Goal 1 (Week 3): Pt will consume therapeutic trials of ice chips and/or thin liquids with efficient oral manipulation of bolus and timely swallow initiation with Max A multimodal cues. SLP Short Term Goal 1 - Progress (Week 3): Progressing toward goal SLP Short Term Goal 2 (Week 3): Pt will increase speech intelligibility to 60% at the word level with Max A multimodal cues for use of increased vocal intensity. SLP Short Term Goal 2 - Progress (Week 3): Progressing toward goal SLP Short Term Goal 3 (Week 3): Pt will sustain attentoin to functional tasks for 10 minute intervals with Mod A verbal/visual cues. SLP Short Term Goal 3 - Progress (Week 3): Partly met SLP Short Term Goal 4 (Week 3): Pt will demonstrate problem solving during basic faimliar tasks with Mod A verbal/visual cues. SLP Short Term Goal 4 - Progress (Week 3): Partly met    New Short Term Goals: Week 4: SLP Short Term Goal 1 (Week 4): Pt will consume therapeutic trials of ice chips and/or thin liquids with efficient oral manipulation of bolus and timely swallow initiation with Max A multimodal cues. SLP Short Term Goal 2 (Week 4): Pt will increase speech intelligibility to 60% at the word level with Max A multimodal cues for use of increased vocal intensity. SLP Short Term Goal 3 (Week 4): Pt will sustain attentoin to functional tasks for 10 minute intervals with Mod A verbal/visual cues. SLP Short Term Goal 4 (Week 4): Pt will demonstrate problem solving during basic faimliar tasks with Mod A verbal/visual cues. SLP Short Term Goal 5 (Week 4):  Pt will recall biographical information with Max A multimodal cues for recall and use of strategies.  Weekly Progress Updates: Pt has made some progress this week, although it has been inconsistent due to fluctuating levels of lethargy and severity of dysphagia, dysarthria, and cognitive impairments. Due to inconsistency in performance, pt met 2/4 short term goals this reporting period. Pt is currently anywhere from Mod-Total assist for sustained attention, basic problem solving, short term recall, and use of speech intelligibility strategies. Pt does follow commands for use of increased vocal intensity, but has been unable to implement articulatory placement exercises to assist with prevision in consonant production. Pt is NPO and had PEG placed earlier this week for long term means nutrition, allowing time for dysphagia therapy with SLP for safe swallowing. Pt has demonstrated improved ability to participate in dysphagia therapy by triggering volitional swallows and pharyngeal strengthening exercises, as well as participating in PO trials. Pt education is ongoing. Pt would continue to benefit from skilled ST while inpatient in order to maximize functional independence and reduce burden of care prior to discharge. Anticipate that pt will need 24/7 supervision at discharge in addition to [place] follow up at next level of care.      Intensity: Minumum of 1-2 x/day, 30 to 90 minutes Frequency: 3 to 5 out of 7 days Duration/Length of Stay: Pending SNF placement Treatment/Interventions: Patient/family education;Cognitive remediation/compensation;Cueing hierarchy;Dysphagia/aspiration precaution training;Functional tasks;Environmental controls;Internal/external aids;Speech/Language facilitation   Daily Session  Skilled Therapeutic Interventions: Pt was seen for skilled ST targeting cognitive goals and secretion management.  Due to fleeting arousal, pt was not appropriate to consume PO trials today. She was also  limited by pain left left side/abdomen, requiring frequent rest breaks. RN aware of pain and premedicated pt. Overall Max A multimodal cues provided for left visual scanning during functional reading activities. Max A also required for recall of this information 10 minutes later. Pt sustained attention to tasks for 2-3 minute intervals with Mod A verbal cues for redirection. Pt able to utilize increased vocal intensity to increase speech intelligibility sightly, however unable to follow articulatory placement cues, specifically to produce /s/ in isolation or within single words. Pt eventually unable to maintain arousal level appropriate for participation, therefore session ended 27 mins early. Pt left laying in bed with alarm set and needs within reach. Continue per current plan of care.         Pain Pain Assessment Pain Scale: Faces Faces Pain Scale: Hurts even more Pain Type: Acute pain Pain Location: Abdomen Pain Orientation: Left Pain Descriptors / Indicators: Grimacing;Moaning;Discomfort Pain Onset: On-going Pain Intervention(s): RN made aware;Emotional support  Therapy/Group: Individual Therapy  Arbutus Leas 01/05/2020, 7:28 AM

## 2020-01-05 NOTE — Progress Notes (Signed)
[facility] PHYSICAL MEDICINE & REHABILITATION PROGRESS NOTE   Subjective/Complaints:  Patient is awake.  She admits to pain in the stomach area but also in the right hip.  She has been prescribed acetaminophen but nothing stronger than this.  She did receive acetaminophen yesterday but not this morning    ROS: limited by cognition     Objective:   No results found. No results for input(s): WBC, HGB, HCT, PLT in the last 72 hours. No results for input(s): NA, K, CL, CO2, GLUCOSE, BUN, CREATININE, CALCIUM in the last 72 hours.  Intake/Output Summary (Last 24 hours) at 01/05/2020 1119 Last data filed at 01/05/2020 0827 Gross per 24 hour  Intake --  Output 1302 ml  Net -1302 ml     Physical Exam: Vital Signs Blood pressure 115/76, pulse 81, temperature 98.4 F (36.9 C), temperature source Oral, resp. rate 17, height 5' 7"  (1.702 m), weight 83.6 kg, SpO2 99 %.     General: No acute distress Mood and affect are appropriate Heart: Regular rate and rhythm no rubs murmurs or extra sounds Lungs: Clear to auscultation, breathing unlabored, no rales or wheezes Abdomen: Positive bowel sounds, soft nontender to palpation, nondistended Extremities: No clubbing, cyanosis, or edema Skin: No evidence of breakdown, no evidence of rash Neurologic: Cranial nerves II through XII intact, motor strength is 4/5 in RIght and trace Left l deltoid, bicep, tricep, grip, hip flexor, knee extensors, ankle dorsiflexor and plantar flexor + left neglect     Skin: R lateral heel DTI seen ~ 1inch diameter    Assessment/Plan: 1. Functional deficits secondary to Left brainstem vs subcortical infarct which require 3+ hours per day of interdisciplinary therapy in a comprehensive inpatient rehab setting.  Physiatrist is providing close team supervision and 24 hour management of active medical problems listed below.  Physiatrist and rehab team continue to assess barriers to discharge/monitor patient  progress toward functional and medical goals  Care Tool:  Bathing  Bathing activity did not occur: Safety/medical concerns Body parts bathed by patient: Face   Body parts bathed by helper: Right arm, Left arm, Chest, Abdomen, Front perineal area, Buttocks, Right upper leg, Left upper leg, Right lower leg, Left lower leg     Bathing assist Assist Level: 2 Helpers     Upper Body Dressing/Undressing Upper body dressing   What is the patient wearing?: Pull over shirt    Upper body assist Assist Level: Total Assistance - Patient < 25%    Lower Body Dressing/Undressing Lower body dressing      What is the patient wearing?: Pants     Lower body assist Assist for lower body dressing: 2 Helpers     Toileting Toileting Toileting Activity did not occur (Clothing management and hygiene only): N/A (no void or bm)  Toileting assist Assist for toileting: Dependent - Patient 0%(3 helpers)     Transfers Chair/bed transfer  Transfers assist  Chair/bed transfer activity did not occur: Safety/medical concerns  Chair/bed transfer assist level: 2 Helpers     Locomotion Ambulation   Ambulation assist   Ambulation activity did not occur: Safety/medical concerns          Walk 10 feet activity   Assist  Walk 10 feet activity did not occur: Safety/medical concerns        Walk 50 feet activity   Assist Walk 50 feet with 2 turns activity did not occur: Safety/medical concerns         Walk 150 feet activity  Assist Walk 150 feet activity did not occur: Safety/medical concerns         Walk 10 feet on uneven surface  activity   Assist Walk 10 feet on uneven surfaces activity did not occur: Safety/medical concerns         Wheelchair     Assist Will patient use wheelchair at discharge?: Yes Type of Wheelchair: Manual Wheelchair activity did not occur: Safety/medical concerns         Wheelchair 50 feet with 2 turns activity    Assist     Wheelchair 50 feet with 2 turns activity did not occur: Safety/medical concerns       Wheelchair 150 feet activity     Assist  Wheelchair 150 feet activity did not occur: Safety/medical concerns       Blood pressure 115/76, pulse 81, temperature 98.4 F (36.9 C), temperature source Oral, resp. rate 17, height 5' 7"  (1.702 m), weight 83.6 kg, SpO2 99 %.    Medical Problem List and Plan: 1.  Rightt side hemiplegia, dysarthria, +/- aphasia secondary to left brain infarction.  MRI not completed due to pacemaker  -CT demonstrates hypoattenuation in left pons which is consistent with current presentation---increased right hp and increased difficulty with oro-pharyngeal control             -patient may shower             Plan is for SNF after CIR  She should be ready once tube feeds are tolerated 2.  Antithrombotics: -DVT/anticoagulation: Subcutaneous Lovenox             -antiplatelet therapy: Continue Aspirin 81 mg daily, Plavix 75 mg daily- may resume post PEG  3. Pain Management: ContinueTylenol as needed, pain is well controlled.  4. Mood with memory deficits.  Aricept 10 mg nightly             -antipsychotic agents: N/A 5. Neuropsych: This patient is NOT capable of making decisions on her own behalf. 6. Skin/Wound Care: Routine skin checks 7. Fluids/Electrolytes/Nutrition: Routine in and outs.  CMP ordered for tomorrow a.m.  3/13- ordered qmonday labs 8.  Hypertension.  Norvasc 2.5 mg daily, Coreg 3.125 mg twice daily.               Monitor with increased mobility Vitals:   01/04/20 1933 01/05/20 0540  BP: 112/65 115/76  Pulse: 73 81  Resp:    Temp: 97.9 F (36.6 C) 98.4 F (36.9 C)  SpO2: 100% 99%   Remains mildly elevated increase amlodipine to 61m daily  9.  Diabetes mellitus with hyperglycemia.  Hemoglobin A1c 8.2.  Lantus insulin 10 units twice daily,               CBG (last 3)  Recent Labs    01/05/20 0004 01/05/20 0415 01/05/20 0803  GLUCAP 268* 208* 99   Have adjusted diabetic management now just receiving Lantus 10.  CAD with stenting as well as pacemaker.  Continue aspirin and Plavix. 11.  CKD stage IIIb.  Creatinine baseline 1.56.             Normalizing back to baseline  3/13- labs monday  12.  Chronic diastolic congestive heart failure.  Monitor for any signs of fluid overload             Daily weights   Filed Weights   01/03/20 0426 01/04/20 0434 01/05/20 0500  Weight: 82.4 kg 81.8 kg 83.6 kg   3/14- weight overall stable,  however no weight today- will ask nursing.  13.  OSA.  CPAP. 14.  Hyperlipidemia.  Lipitor 15.  Chronic normocytic anemia.  Continue iron supplement             hgb 10 16.  Post stroke dysphagia: Dysphagia #2 thin liquids.  3/13- was on D2 thin lqiuids, but appears is now NPO- getting TFs by NGT- con't regimen and monitor- will need open to get PEG    Follow-up speech therapy.  Advance as tolerated  3/2: see diet/nutrition.  17.  UTI/Citrobacter/EColi:  Resolved    19. Hypercalcemia- ? Etiology, nephro following 20.  Severe Dysphagia, NPO-    POD #1 post PEG    21. R heel DTI  3/13- might have been blister initially but now DTI- pic as above- will cover for standing activities and wear PRAFO when not standing otherwise.   22. Sedation- actually has improved. Brainstem and subocrtical infarcts typically do not cause RAS depression but may trial ritalin     23. Urinary retention  Severe stroke plus immobility +/- diabetic cystopathy  , may trial flomax /urecholine but given severe mobility issues not optimistic about effect  We will try to escalate Urecholine dose but if this is not helpful we will discontinue  24.  Stercoral ulcer avoid constipation  LOS: 22 days A FACE TO FACE EVALUATION WAS PERFORMED  Charlett Blake 01/05/2020, 11:19 AM

## 2020-01-05 NOTE — Plan of Care (Signed)
  Problem: Consults Goal: RH STROKE PATIENT EDUCATION Description: See Patient Education module for education specifics  Outcome: Progressing Goal: Nutrition Consult-if indicated Outcome: Progressing Goal: Diabetes Guidelines if Diabetic/Glucose > 140 Description: If diabetic or lab glucose is > 140 mg/dl - Initiate Diabetes/Hyperglycemia Guidelines & Document Interventions  Outcome: Progressing   Problem: RH BOWEL ELIMINATION Goal: RH STG MANAGE BOWEL WITH ASSISTANCE Description: STG Manage Bowel with Moderate Assistance. Outcome: Progressing   Problem: RH BLADDER ELIMINATION Goal: RH STG MANAGE BLADDER WITH ASSISTANCE Description: STG Manage Bladder With Moderate Assistance Outcome: Progressing   Problem: RH SKIN INTEGRITY Goal: RH STG SKIN FREE OF INFECTION/BREAKDOWN Description: Patient will verbalize how to prevent skin breakdown during hospitalization. Outcome: Progressing Goal: RH STG MAINTAIN SKIN INTEGRITY WITH ASSISTANCE Description: STG Maintain Skin Integrity With Moderate Assistance. Outcome: Progressing   Problem: RH SAFETY Goal: RH STG ADHERE TO SAFETY PRECAUTIONS W/ASSISTANCE/DEVICE Description: STG Adhere to Safety Precautions With Moderate Assistance/Device. Outcome: Progressing   Problem: RH COGNITION-NURSING Goal: RH STG USES MEMORY AIDS/STRATEGIES W/ASSIST TO PROBLEM SOLVE Description: STG Uses Memory Aids/Strategies With Moderate Assistance to Problem Solve. Outcome: Progressing   Problem: RH PAIN MANAGEMENT Goal: RH STG PAIN MANAGED AT OR BELOW PT'S PAIN GOAL Description: <5 Outcome: Progressing   Problem: RH KNOWLEDGE DEFICIT Goal: RH STG INCREASE KNOWLEDGE OF DIABETES Outcome: Progressing Goal: RH STG INCREASE KNOWLEDGE OF HYPERTENSION Outcome: Progressing Goal: RH STG INCREASE KNOWLEDGE OF DYSPHAGIA/FLUID INTAKE Outcome: Progressing

## 2020-01-05 NOTE — Progress Notes (Signed)
Occupational Therapy Session Note  Patient Details  Name: [name] MRN: [id number] Date of Birth: [birth year]  Today's Date: 01/05/2020 OT Individual Time: 5974-7185 OT Individual Time Calculation (min): 45 min  30 missed minutes secondary to fatigue.   Short Term Goals: Week 3:  OT Short Term Goal 1 (Week 3): Pt will thread her affected arm into shirt sleeve with Mod A to improve Rt sided attention OT Short Term Goal 2 (Week 3): Pt will release grip on bedrail during functional bed mobility related to dressing or toileting with no more than mod multimodal cuing  Skilled Therapeutic Interventions/Progress Updates:    Upon entering the room, pt supine in bed and sleeping soundly. RN reports pt having just been put into bed and I & O cath prior to OT arrival. Pt opens eyes briefly when therapist calls her name. Wash cloth placed on face in an attempt to alert pt she was unable to wash face herself this session. OT removing soiled T shirt and placed cloth over L  Hand and she needed hand over hand assistance to initiate but then briefly washed part of chest. Have over hand assistance to wash other parts. Total A to don hospital gown. Total A of 2 for B washing this session. OT attempting to have pt put lotion on herself but unable to remain awake. Bed lowered and pt repositioned for safety. Call bell within reach and bed alarm activated.   Therapy Documentation Precautions:  Precautions Precautions: Fall Restrictions Weight Bearing Restrictions: No General: General OT Amount of Missed Time: 25 Minutes Vital Signs: Therapy Vitals Temp: 98.2 F (36.8 C) Temp Source: Oral Pulse Rate: 71 Resp: 20 BP: 112/89 Patient Position (if appropriate): Lying Oxygen Therapy SpO2: 100 % O2 Device: Room Air   Therapy/Group: Individual Therapy  Gypsy Decant 01/05/2020, 4:14 PM

## 2020-01-06 ENCOUNTER — Inpatient Hospital Stay (HOSPITAL_COMMUNITY): Payer: Medicare Other | Admitting: Physical Therapy

## 2020-01-06 ENCOUNTER — Inpatient Hospital Stay (HOSPITAL_COMMUNITY): Payer: Medicare Other

## 2020-01-06 ENCOUNTER — Inpatient Hospital Stay (HOSPITAL_COMMUNITY): Payer: Medicare Other | Admitting: Occupational Therapy

## 2020-01-06 LAB — GLUCOSE, CAPILLARY
Glucose-Capillary: 146 mg/dL — ABNORMAL HIGH (ref 70–99)
Glucose-Capillary: 170 mg/dL — ABNORMAL HIGH (ref 70–99)
Glucose-Capillary: 192 mg/dL — ABNORMAL HIGH (ref 70–99)
Glucose-Capillary: 202 mg/dL — ABNORMAL HIGH (ref 70–99)
Glucose-Capillary: 63 mg/dL — ABNORMAL LOW (ref 70–99)
Glucose-Capillary: 95 mg/dL (ref 70–99)

## 2020-01-06 MED ORDER — PRO-STAT SUGAR FREE PO LIQD
30.0000 mL | Freq: Every day | ORAL | Status: DC
  Administered 2020-01-06 – 2020-01-10 (×5): 30 mL
  Filled 2020-01-06 (×4): qty 30

## 2020-01-06 MED ORDER — JEVITY 1.5 CAL/FIBER PO LIQD
237.0000 mL | Freq: Every day | ORAL | Status: DC
  Administered 2020-01-06 – 2020-01-10 (×20): 237 mL
  Filled 2020-01-06: qty 1000
  Filled 2020-01-06: qty 237
  Filled 2020-01-06 (×22): qty 1000

## 2020-01-06 MED ORDER — FREE WATER
200.0000 mL | Freq: Three times a day (TID) | Status: DC
  Administered 2020-01-06 – 2020-01-10 (×11): 200 mL

## 2020-01-06 NOTE — Progress Notes (Signed)
Nutrition Follow-up  DOCUMENTATION CODES:   Not applicable  INTERVENTION:  Initiate bolus tube feeds via PEG using Jevity 1.5 formula at goal volume of 237 ml (1 can/ARC) given 5 times daily.   Provide 30 ml Prostat once daily per tube.   Provide free water flushes of 200 ml TID in between bolus feeds.  Tube feeding regimen provides 1878 kcal, 91 grams of protein, and 1501 ml water.   NUTRITION DIAGNOSIS:   Inadequate oral intake related to inability to eat, dysphagia as evidenced by NPO status; ongoing  GOAL:   Patient will meet greater than or equal to 90% of their needs; met with TF  MONITOR:   TF tolerance, Skin, Weight trends, Labs, I & O's  REASON FOR ASSESSMENT:   Consult Enteral/tube feeding initiation and management  ASSESSMENT:   [name] is an 86 year old right-handed female with history of CAD with stenting, CKD stage III with creatinine 1.56, CVA maintained on Plavix, memory loss maintained on Aricept followed by Dr. Jannifer Franklin, heart block with pacemaker, hypertension, type 2 diabetes mellitus, OSA with CPAP, chronic normocytic anemia, chronic diastolic congestive heart failure.  History taken from chart review due to cognition.  Patient lives with family.  1 level home.  Used a cane prior to admission for mobility and daughter assist with some basic ADLs.  She presented on 12/07/2019 after fall without LOC.  She was noted to have slurred speech and left hemiparesis.  Cranial CT scan unremarkable for acute intracranial process.  Patient did not receive TPA.  MRI not completed due to pacemaker.  Echocardiogram with ejection fraction of 55% without emboli.  Neurology follow-up currently maintained on aspirin and Plavix for CVA prophylaxis subcutaneous Lovenox for DVT prophylaxis.  Admission chemistries with creatinine 1.62, SARS coronavirus negative.  Hospital course further complicated by postop dysphagia, on a dysphagia 2 thin liquid diet.  Urinalysis study  12/12/2019 greater than 100,000 Citrobacter placed on Merrem 12/13/2019 for UTI.  Therapy evaluations completed and patient was admitted for a comprehensive rehab program.  Please see preadmission assessment from earlier today as well.  2/25- s/p BSE- diet downgraded from dysphagia2diet with thin liquids to dysphagia 1 diet with thin liquids, due to fatigue; head CT revealedsubacute left pontine infarct 2/26- s/p BSE- diet downgraded to dysphagia 1 with nectar thick liquids 3/1- s/p MBSS- recommend NPO, unsafe for PO intake at this time; NGT placed at bedside- tip of tube confirmed in stomach via KUB 3/2 - Cortrak (gastric) 3/12 - s/p sigmoidoscopy 3/16 - PEG placed   Pt remains lethargic per MD. RN reports pt with abdominal discomfort with current bolus tube feeding regimen. Per PA, Dan, requesting bolus tube feeds to be modified to be given 5 times a day instead of current 4 times a day to provide less volume at feedings and to aid in tolerance. RD to modify orders to provide bolus feeds 5 times daily.   Labs and medications reviewed.   Diet Order:   Diet Order            Diet NPO time specified  Diet effective now              EDUCATION NEEDS:   No education needs have been identified at this time  Skin:  Skin Assessment: Skin Integrity Issues: Skin Integrity Issues:: DTI, Incisions DTI: R heel Incisions: abdomen  Last BM:  3/18  Height:   Ht Readings from Last 1 Encounters:  12/30/19 5' 7"  (1.702 m)  Weight:   Wt Readings from Last 1 Encounters:  01/06/20 82.2 kg    BMI:  Body mass index is 28.38 kg/m.  Estimated Nutritional Needs:   Kcal:  1700-1900  Protein:  85-100 grams  Fluid:  > 1.7 L    Corrin Parker, MS, RD, LDN RD pager number/after hours weekend pager number on Amion.

## 2020-01-06 NOTE — Progress Notes (Signed)
Physical Therapy Session Note  Patient Details  Name: [name] MRN: [id number] Date of Birth: June 30, 1928  Today's Date: 01/06/2020 PT Individual Time: 1510-1535 PT Individual Time Calculation (min): 25 min   Short Term Goals: Week 3:  PT Short Term Goal 1 (Week 3): Pt will perform bed mobility with max assist PT Short Term Goal 2 (Week 3): Pt will transfer to [facility] with mod assist PT Short Term Goal 3 (Week 3): Pt will sustain standing in parallel bars up to 45 seconds.  Skilled Therapeutic Interventions/Progress Updates:    Patient received in bed, moaning and grimacing but willing to participate in "bonus" therapy session. Worked on bed level exercises (no +2 assist available due to this being a "bonus" session) for BLE strength, ROM, and motor control in bed this session. Often had to cue patient to close mouth/swallow spit as per speech therapy recommendations on wall however did eventually need to suction her due to a large amount of secretions after activity. She was left in bed with all needs met, bed alarm active at EOS.   Therapy Documentation Precautions:  Precautions Precautions: Fall Restrictions Weight Bearing Restrictions: No Pain: Pain Assessment Pain Scale: Faces Faces Pain Scale: Hurts whole lot Pain Type: Acute pain Pain Location: Generalized Pain Orientation: Right Pain Descriptors / Indicators: Grimacing;Moaning Pain Onset: On-going Patients Stated Pain Goal: 0 Pain Intervention(s): Emotional support;Repositioned;Distraction    Therapy/Group: Individual Therapy   Windell Norfolk, DPT, PN1   Supplemental Physical Therapist [place]    Pager [phone] Acute Rehab Office [phone]    01/06/2020, 4:05 PM

## 2020-01-06 NOTE — Progress Notes (Signed)
Occupational Therapy Session Note  Patient Details  Name: [name] MRN: [id number] Date of Birth: 08-29-1928  Today's Date: 01/06/2020 OT Individual Time: 4888-9169 OT Individual Time Calculation (min): 42 min   Short Term Goals: Week 3:  OT Short Term Goal 1 (Week 3): Pt will thread her affected arm into shirt sleeve with Mod A to improve Rt sided attention OT Short Term Goal 2 (Week 3): Pt will release grip on bedrail during functional bed mobility related to dressing or toileting with no more than mod multimodal cuing  Skilled Therapeutic Interventions/Progress Updates:    Pt greeted in bed with RN present. Pt appearing sleepy and lethargic, opening eyes at sound of her name. She stated "I don't know" when asked if she felt ok, denying pain, also responded "I don't know" when asked if her brief was soiled. Checked brief at start of session which was clean. +2 for boosting pt up in bed for improvement in alignment with pt able to hold onto her Rt arm for protection when instruction and setup. Afterwards HOB was set to 30 degrees, then worked on following 1 step instruction, having pt recognize and touch affected UE functionally, and visually locate items in her Rt hand. Pt engaged in face washing, hand washing using sanitizer, lotion application, and PROM for affected side. Max exertion and Min A for washing/drying her face using the unaffected arm. When pt was asked to locate her Rt arm during stated self care tasks, she initially grabbed OT's hand or arm, progressing to touching her own arm with visual and HOH feedback. Max vcs and Mod A for locating sanitizer and lotion on Rt side.Transitioned to ROM afterwards, max vcs for pt to count aloud with therapist while stretches were maintained, pt only able to mumble the numbers at this time due to increased sleepiness. Noted increased symptoms and verbalizations of pain with midrange ROM of forearm supination, shoulder abduction, and shoulder  external rotation. At end of session her affected UE was elevated for edema mgt. Left her with bed alarm set and call bell in lap. She was asleep before OT left the room.   Therapy Documentation Precautions:  Precautions Precautions: Fall Restrictions Weight Bearing Restrictions: No Pain: during ROM, PROM modified to pain-free ranges, pt denied pain at rest Pain Assessment Pain Scale: Faces Pain Score: Asleep ADL:        Therapy/Group: Individual Therapy  Jakhari Space A Baillie Mohammad 01/06/2020, 12:21 PM

## 2020-01-06 NOTE — Progress Notes (Signed)
PHYSICAL MEDICINE & REHABILITATION PROGRESS NOTE   Subjective/Complaints:  Awake, remains lethargic    ROS: limited by cognition     Objective:   No results found. No results for input(s): WBC, HGB, HCT, PLT in the last 72 hours. No results for input(s): NA, K, CL, CO2, GLUCOSE, BUN, CREATININE, CALCIUM in the last 72 hours.  Intake/Output Summary (Last 24 hours) at 01/06/2020 0852 Last data filed at 01/06/2020 0444 Gross per 24 hour  Intake 1500 ml  Output 2201 ml  Net -701 ml     Physical Exam: Vital Signs Blood pressure (!) 141/67, pulse 80, temperature 98.4 F (36.9 C), resp. rate 20, height 5' 7"  (1.702 m), weight 82.2 kg, SpO2 98 %.       General: No acute distress Mood and affect flat  Heart: Regular rate and rhythm no rubs murmurs or extra sounds Lungs: Clear to auscultation, breathing unlabored, no rales or wheezes Abdomen: Positive bowel sounds, soft nontender to palpation, mildly distended Extremities: No clubbing, cyanosis, or edema Skin: G tube site, CDI mild tenderness, no erythema or drainage  Musculoskeletal: Full range of motion in all 4 extremities. No joint swelling Neurologic: Cranial nerves II through XII intact, motor strength is 4/5 in RIght and trace Left l deltoid, bicep, tricep, grip, hip flexor, knee extensors, ankle dorsiflexor and plantar flexor + left neglect         Assessment/Plan: 1. Functional deficits secondary to Left brainstem vs subcortical infarct which require 3+ hours per day of interdisciplinary therapy in a comprehensive inpatient rehab setting.  Physiatrist is providing close team supervision and 24 hour management of active medical problems listed below.  Physiatrist and rehab team continue to assess barriers to discharge/monitor patient progress toward functional and medical goals  Care Tool:  Bathing  Bathing activity did not occur: Safety/medical concerns Body parts bathed by patient: Face    Body parts bathed by helper: Right arm, Left arm, Chest, Abdomen, Front perineal area, Buttocks, Right upper leg, Left upper leg, Right lower leg, Left lower leg     Bathing assist Assist Level: 2 Helpers     Upper Body Dressing/Undressing Upper body dressing   What is the patient wearing?: Hospital gown only    Upper body assist Assist Level: Total Assistance - Patient < 25%    Lower Body Dressing/Undressing Lower body dressing      What is the patient wearing?: Hospital gown only     Lower body assist Assist for lower body dressing: 2 Helpers     Toileting Toileting Toileting Activity did not occur (Clothing management and hygiene only): N/A (no void or bm)  Toileting assist Assist for toileting: Dependent - Patient 0%(3 helpers)     Transfers Chair/bed transfer  Transfers assist  Chair/bed transfer activity did not occur: Safety/medical concerns  Chair/bed transfer assist level: 2 Helpers     Locomotion Ambulation   Ambulation assist   Ambulation activity did not occur: Safety/medical concerns          Walk 10 feet activity   Assist  Walk 10 feet activity did not occur: Safety/medical concerns        Walk 50 feet activity   Assist Walk 50 feet with 2 turns activity did not occur: Safety/medical concerns         Walk 150 feet activity   Assist Walk 150 feet activity did not occur: Safety/medical concerns         Walk 10 feet on uneven surface  activity   Assist Walk 10 feet on uneven surfaces activity did not occur: Safety/medical concerns         Wheelchair     Assist Will patient use wheelchair at discharge?: Yes Type of Wheelchair: Manual Wheelchair activity did not occur: Safety/medical concerns         Wheelchair 50 feet with 2 turns activity    Assist    Wheelchair 50 feet with 2 turns activity did not occur: Safety/medical concerns       Wheelchair 150 feet activity     Assist  Wheelchair 150  feet activity did not occur: Safety/medical concerns       Blood pressure (!) 141/67, pulse 80, temperature 98.4 F (36.9 C), resp. rate 20, height 5' 7"  (1.702 m), weight 82.2 kg, SpO2 98 %.    Medical Problem List and Plan: 1.  Rightt side hemiplegia, dysarthria, +/- aphasia secondary to left brain infarction.  MRI not completed due to pacemaker  -CT demonstrates hypoattenuation in left pons which is consistent with current presentation---increased right hp and increased difficulty with oro-pharyngeal control             -patient may shower             Plan is for SNF after CIR  She should be ready once tube feeds are tolerated 2.  Antithrombotics: -DVT/anticoagulation: Subcutaneous Lovenox             -antiplatelet therapy: Continue Aspirin 81 mg daily, Plavix 75 mg daily- may resume post PEG  3. Pain Management: ContinueTylenol as needed, pain is well controlled.  4. Mood with memory deficits.  Aricept 10 mg nightly             -antipsychotic agents: N/A 5. Neuropsych: This patient is NOT capable of making decisions on her own behalf. 6. Skin/Wound Care: Routine skin checks 7. Fluids/Electrolytes/Nutrition: Routine in and outs. Now tolerating TF D/C IVF8.  Hypertension.  Norvasc 2.5 mg daily, Coreg 3.125 mg twice daily.               Monitor with increased mobility Vitals:   01/05/20 2004 01/06/20 0442  BP: (!) 121/48 (!) 141/67  Pulse: 78 80  Resp:    Temp:  98.4 F (36.9 C)  SpO2: 98% 98%   Controlled  9.  Diabetes mellitus with hyperglycemia.  Hemoglobin A1c 8.2.  Lantus insulin 10 units twice daily,               CBG (last 3)  Recent Labs    01/06/20 0051 01/06/20 0416 01/06/20 0817  GLUCAP 146* 95 63*  Have adjusted diabetic management now just receiving Lantus will reduce dose  10.  CAD with stenting as well as pacemaker.  Continue aspirin and Plavix. 11.  CKD stage IIIb.  Creatinine baseline 1.56.             Normalizing back to baseline  3/13- labs monday   12.  Chronic diastolic congestive heart failure.  Monitor for any signs of fluid overload             Daily weights   Filed Weights   01/04/20 0434 01/05/20 0500 01/06/20 0500  Weight: 81.8 kg 83.6 kg 82.2 kg   3/14- weight overall stable, however no weight today- will ask nursing.  13.  OSA.  CPAP. 14.  Hyperlipidemia.  Lipitor 15.  Chronic normocytic anemia.  Continue iron supplement  hgb 10 16.  Post stroke dysphagia: Dysphagia #2 thin liquids.  3/13- was on D2 thin lqiuids, but appears is now NPO- getting TFs by NGT- con't regimen and monitor- will need open to get PEG    Follow-up speech therapy.  Advance as tolerated  3/2: see diet/nutrition.  17.  UTI/Citrobacter/EColi:  Resolved    19. Hypercalcemia- ? Etiology, nephro following 20.  Severe Dysphagia, NPO-    POD #3 post PEG    21. R heel DTI  3/13- might have been blister initially but now DTI- pic as above- will cover for standing activities and wear PRAFO when not standing otherwise.   22. Sedation- actually has improved. Brainstem and subocrtical infarcts typically do not cause RAS depression but may trial ritalin     23. Urinary retention  Severe stroke plus immobility +/- diabetic cystopathy  , may trial flomax /urecholine but given severe mobility issues not optimistic about effect  D/C  Urecholine no improvement with escalating doses   24.  Stercoral ulcer avoid constipation  LOS: 23 days A FACE TO [place] E Sophy Mesler 01/06/2020, 8:52 AM

## 2020-01-06 NOTE — Progress Notes (Signed)
Physical Therapy Session Note  Patient Details  Name: [name] MRN: [id number] Date of Birth: [birth year]  Today's Date: 01/06/2020 PT Individual Time: 1510-1535 PT Individual Time Calculation (min): 25 min   Short Term Goals: WWeek 3:  PT Short Term Goal 1 (Week 3): Pt will perform bed mobility with max assist PT Short Term Goal 2 (Week 3): Pt will transfer to [facility] with mod assist PT Short Term Goal 3 (Week 3): Pt will sustain standing in parallel bars up to 45 seconds.  Skilled Therapeutic Interventions/Progress Updates:   Pt received supine in bed asleep. Aroused with effort. Pt noted to be moaning as in pain, with facial grimace. Pt affirms pain in hip when questioned. PT assisted pt to reposition in bed and donned PRAFO for pressure relief to the heel. AAROM for RUE into wrist supination and extension as wellas shoulder ER. 4 x 20sec hold in each direction. Throughout AAROM pt falling asleep, and un able to sustain attention to task. Pt left supine in bed with call bell in reach and all needs met.     Therapy Documentation Precautions:  Precautions Precautions: Fall Restrictions Weight Bearing Restrictions: No Vital Signs: Therapy Vitals Temp: 99.5 F (37.5 C) Temp Source: Oral Pulse Rate: 82 Resp: 19 BP: (!) 140/97 Patient Position (if appropriate): Lying Oxygen Therapy SpO2: 98 % O2 Device: Room Air Pain: Pain Assessment Pain Scale: Faces Faces Pain Scale: Hurts whole lot Pain Type: Acute pain Pain Location: Generalized Pain Orientation: Right Pain Descriptors / Indicators: Grimacing;Moaning Pain Onset: On-going Patients Stated Pain Goal: 0 Pain Intervention(s): Emotional support;Repositioned;Distraction    Therapy/Group: Individual Therapy  Lorie Phenix 01/06/2020, 6:09 PM

## 2020-01-06 NOTE — Progress Notes (Signed)
Physical Therapy Session Note  Patient Details  Name: [name] MRN: [id number] Date of Birth: 11-09-1927  Today's Date: 01/06/2020 PT Individual Time: 1330-1415 and 1016-1031 PT Individual Time Calculation (min): 45 min  And 15 min Short Term Goals: Week 1:  PT Short Term Goal 1 (Week 1): pt will remain out of bed 1 hour between therapies. PT Short Term Goal 1 - Progress (Week 1): Met PT Short Term Goal 2 (Week 1): Pt will perform bed<>WC transfer with max assist of 1 person PT Short Term Goal 2 - Progress (Week 1): Not met PT Short Term Goal 3 (Week 1): Pt will attend to R side 25 % the time with moderate cues in functional tasks. PT Short Term Goal 3 - Progress (Week 1): Met PT Short Term Goal 4 (Week 1): Pt will initiate WC mobility PT Short Term Goal 4 - Progress (Week 1): Not met Week 2:  PT Short Term Goal 1 (Week 2): Pt will maintain sitting balance EOB with min assist consistently up to 3 minutes. PT Short Term Goal 1 - Progress (Week 2): Met PT Short Term Goal 2 (Week 2): Pt will transfer to [facility] with max assist of 1 person. PT Short Term Goal 2 - Progress (Week 2): Met PT Short Term Goal 3 (Week 2): Pt will perform bed mobility with max assis tof 1 consistently PT Short Term Goal 3 - Progress (Week 2): Progressing toward goal Week 3:  PT Short Term Goal 1 (Week 3): Pt will perform bed mobility with max assist PT Short Term Goal 2 (Week 3): Pt will transfer to [facility] with mod assist PT Short Term Goal 3 (Week 3): Pt will sustain standing in parallel bars up to 45 seconds.  Skilled Therapeutic Interventions/Progress Updates:    AM SESSION: PAIN pt indicates discomfort when managing RLE, care taken with mobility to minimize/avoid discomfort  Pt initially supine and sleeping.  Able to waken patient for 15 min session focusing on bed mobility and dressing.   Pt rolls to R mult times w/hand over hand guidance to initiate and to locate bedrails, to initiate and complete LLE hip  and knee flexion.  Assist varies from max to mod for rolling to R.  Total assist for removal of hospital gown.  Pt will lift RLE partially for therapist to thread pants onto RLE, attempts to initiate lifting of RLE/max assist to complete and thread RLE into pants.  Pt rolls to L w/max assist, hand over hand assist to reach for L rail w/LUE, total assist to manage RUE/LE Total assist to raise pants w/patient rolling mult times as above.  Total assist w/RUE dressing, Pt initiates threading LUE into sleeve but max assist to complete, total assist to lift shirt over head.  Rolls mult times each direction as described above for therapist to pull shirt down.   Pt left supine w/rails up x 4, alarm set, bed in lowest position, and needs in reach.     PM Session: Pt initially oob in wc, sleeping but able to wake w/verbal interaction.  Pt transported to gym for continued session.  Pt leans in wc for donning of standing frame harness. Total assist for set up in standing frame. Pt educated on use of standing frame then brought to standing using mechanical lift, cues for LUE positioning on tray, therapist guarding/placing RUE on tray.   Using mirror for feedback and significant verbal and tactile cues, worked on upright posture.  Pt able to achieve  midline upperbody, but maintains briefly w/poor attention to task.  Worked on reaching across midline to R to grasp pegs placed to R then place them in peg board on pts L, requires hand over hand assist for grasping and locating holes in peg board due to significant perceptual deficits.  Rue placed in wbing position w/task. Pt able to return to midline between each pass w/multimodal cues, briefly maintains.  Pt moaning during task but denies pain via nodding "no". Returned to sitting via lift and repositioning in wc.  Pt able to wt shift anteriorly w/R tendency for removal of harness.  Pt transported back to room.  Worked on STS from wc to stedy w/max assist of 2, heavy R  lean.  Transferred to edge of bed w/Stedy.  Semistand to stand w/ max assist of 2 repeated x 3, last effort max assist to sit on edge of bed.  Sit to supine max assis of 2 and cues for sequencing.  Pt left w/rails up x 4, alarm set, and nursing in room for care.      Therapy Documentation Precautions:  Precautions Precautions: Fall Restrictions Weight Bearing Restrictions: No    Therapy/Group: Individual Therapy  Callie Fielding, [place] 01/06/2020, 3:59 PM

## 2020-01-06 NOTE — Progress Notes (Signed)
Physical Therapy Session Note  Patient Details  Name: [name] MRN: [id number] Date of Birth: 09/15/1928  Today's Date: 01/06/2020 PT Individual Time: 1032-1100 PT Individual Time Calculation (min): 28 min   Short Term Goals: Week 3:  PT Short Term Goal 1 (Week 3): Pt will perform bed mobility with max assist PT Short Term Goal 2 (Week 3): Pt will transfer to [facility] with mod assist PT Short Term Goal 3 (Week 3): Pt will sustain standing in parallel bars up to 45 seconds.  Skilled Therapeutic Interventions/Progress Updates: Pt presents supine in bed, moaning, but states no pain and agreeable to participate in therapy.  Pt required max to total assist to transfer sup to sit and scoot to EOB.  Pt using left UE w/ verbal cueing.  Pt sat EOB w/ left UE support.  Pt cued to lean to left elbow to place slide board under right hip/thigh.  Pt required assist of 2 to perform sequential slide board transfer bed to TIS w/c.  Pt brought to hallway railing and required assist of 2 for sit to stand w/ blocking right knee and 2 point-pressure for upright posture.  Pt performed x 2 standing x 2'.  Pt returned to reclined position in TIS and brought to bedside w/ chair alarm on.  NT in room and aware of need to stay OOB, nursing also aware and will return to bed if needed.     Therapy Documentation Precautions:  Precautions Precautions: Fall Restrictions Weight Bearing Restrictions: No General:   Vital Signs:  Pain:no c/o pain   :      Therapy/Group: Individual Therapy  Ladoris Gene 01/06/2020, 11:04 AM

## 2020-01-06 NOTE — Plan of Care (Signed)
  Problem: Consults Goal: RH STROKE PATIENT EDUCATION Description: See Patient Education module for education specifics  Outcome: Progressing Goal: Nutrition Consult-if indicated Outcome: Progressing Goal: Diabetes Guidelines if Diabetic/Glucose > 140 Description: If diabetic or lab glucose is > 140 mg/dl - Initiate Diabetes/Hyperglycemia Guidelines & Document Interventions  Outcome: Progressing   Problem: RH BOWEL ELIMINATION Goal: RH STG MANAGE BOWEL WITH ASSISTANCE Description: STG Manage Bowel with Moderate Assistance. Outcome: Progressing   Problem: RH BLADDER ELIMINATION Goal: RH STG MANAGE BLADDER WITH ASSISTANCE Description: STG Manage Bladder With Moderate Assistance Outcome: Progressing   Problem: RH SKIN INTEGRITY Goal: RH STG SKIN FREE OF INFECTION/BREAKDOWN Description: Patient will verbalize how to prevent skin breakdown during hospitalization. Outcome: Progressing Goal: RH STG MAINTAIN SKIN INTEGRITY WITH ASSISTANCE Description: STG Maintain Skin Integrity With Moderate Assistance. Outcome: Progressing   Problem: RH SAFETY Goal: RH STG ADHERE TO SAFETY PRECAUTIONS W/ASSISTANCE/DEVICE Description: STG Adhere to Safety Precautions With Moderate Assistance/Device. Outcome: Progressing   Problem: RH COGNITION-NURSING Goal: RH STG USES MEMORY AIDS/STRATEGIES W/ASSIST TO PROBLEM SOLVE Description: STG Uses Memory Aids/Strategies With Moderate Assistance to Problem Solve. Outcome: Progressing   Problem: RH PAIN MANAGEMENT Goal: RH STG PAIN MANAGED AT OR BELOW PT'S PAIN GOAL Description: <5 Outcome: Progressing   Problem: RH KNOWLEDGE DEFICIT Goal: RH STG INCREASE KNOWLEDGE OF DIABETES Outcome: Progressing Goal: RH STG INCREASE KNOWLEDGE OF HYPERTENSION Outcome: Progressing Goal: RH STG INCREASE KNOWLEDGE OF DYSPHAGIA/FLUID INTAKE Outcome: Progressing

## 2020-01-07 LAB — GLUCOSE, CAPILLARY
Glucose-Capillary: 171 mg/dL — ABNORMAL HIGH (ref 70–99)
Glucose-Capillary: 181 mg/dL — ABNORMAL HIGH (ref 70–99)
Glucose-Capillary: 215 mg/dL — ABNORMAL HIGH (ref 70–99)
Glucose-Capillary: 248 mg/dL — ABNORMAL HIGH (ref 70–99)
Glucose-Capillary: 58 mg/dL — ABNORMAL LOW (ref 70–99)
Glucose-Capillary: 78 mg/dL (ref 70–99)
Glucose-Capillary: 91 mg/dL (ref 70–99)

## 2020-01-07 MED ORDER — INSULIN ASPART 100 UNIT/ML ~~LOC~~ SOLN
0.0000 [IU] | Freq: Three times a day (TID) | SUBCUTANEOUS | Status: DC
  Administered 2020-01-07: 3 [IU] via SUBCUTANEOUS
  Administered 2020-01-07: 5 [IU] via SUBCUTANEOUS
  Administered 2020-01-08: 2 [IU] via SUBCUTANEOUS
  Administered 2020-01-08 – 2020-01-09 (×3): 5 [IU] via SUBCUTANEOUS

## 2020-01-07 NOTE — Progress Notes (Signed)
Hypoglycemic Event  CBG: 58  Treatment: 15m of 1.5 cal jevity   Symptoms: asymtomatic  Follow-up CBG: Time: 0434 CBG Result: 78  Possible Reasons for Event: insulin coverage at 0030 with no bolus feed in between  Comments/MD notified: protocol followed    JChilton Si

## 2020-01-07 NOTE — Progress Notes (Signed)
[place] PHYSICAL MEDICINE & REHABILITATION PROGRESS NOTE   Subjective/Complaints:  Awake with cont p[oor oromotor control     ROS: limited by cognition     Objective:   No results found. No results for input(s): WBC, HGB, HCT, PLT in the last 72 hours. No results for input(s): NA, K, CL, CO2, GLUCOSE, BUN, CREATININE, CALCIUM in the last 72 hours.  Intake/Output Summary (Last 24 hours) at 01/07/2020 1140 Last data filed at 01/07/2020 0551 Gross per 24 hour  Intake --  Output 2000 ml  Net -2000 ml     Physical Exam: Vital Signs Blood pressure (!) 154/59, pulse 83, temperature (!) 97.4 F (36.3 C), temperature source Axillary, resp. rate 18, height 5' 7"  (1.702 m), weight 85.9 kg, SpO2 99 %.   General: No acute distress Mood and affect are appropriate Heart: Regular rate and rhythm no rubs murmurs or extra sounds Lungs: Clear to auscultation, breathing unlabored, no rales or wheezes Abdomen: Positive bowel sounds, soft nontender to palpation, nondistended Extremities: No clubbing, cyanosis, or edema Skin: No evidence of breakdown, no evidence of rash  Neurologic: Cranial nerves II through XII intact, motor strength is 4/5 in RIght and trace Left l deltoid, bicep, tricep, grip, hip flexor, knee extensors, ankle dorsiflexor and plantar flexor + left neglect         Assessment/Plan: 1. Functional deficits secondary to Left brainstem vs subcortical infarct which require 3+ hours per day of interdisciplinary therapy in a comprehensive inpatient rehab setting.  Physiatrist is providing close team supervision and 24 hour management of active medical problems listed below.  Physiatrist and rehab team continue to assess barriers to discharge/monitor patient progress toward functional and medical goals  Care Tool:  Bathing  Bathing activity did not occur: Safety/medical concerns Body parts bathed by patient: Face   Body parts bathed by helper: Right arm, Left arm,  Chest, Abdomen, Front perineal area, Buttocks, Right upper leg, Left upper leg, Right lower leg, Left lower leg     Bathing assist Assist Level: 2 Helpers     Upper Body Dressing/Undressing Upper body dressing   What is the patient wearing?: Hospital gown only    Upper body assist Assist Level: Total Assistance - Patient < 25%    Lower Body Dressing/Undressing Lower body dressing      What is the patient wearing?: Hospital gown only     Lower body assist Assist for lower body dressing: 2 Helpers     Toileting Toileting Toileting Activity did not occur (Clothing management and hygiene only): N/A (no void or bm)  Toileting assist Assist for toileting: Dependent - Patient 0%(3 helpers)     Transfers Chair/bed transfer  Transfers assist  Chair/bed transfer activity did not occur: Safety/medical concerns  Chair/bed transfer assist level: 2 Helpers     Locomotion Ambulation   Ambulation assist   Ambulation activity did not occur: Safety/medical concerns          Walk 10 feet activity   Assist  Walk 10 feet activity did not occur: Safety/medical concerns        Walk 50 feet activity   Assist Walk 50 feet with 2 turns activity did not occur: Safety/medical concerns         Walk 150 feet activity   Assist Walk 150 feet activity did not occur: Safety/medical concerns         Walk 10 feet on uneven surface  activity   Assist Walk 10 feet on uneven surfaces activity  did not occur: Safety/medical concerns         Wheelchair     Assist Will patient use wheelchair at discharge?: Yes Type of Wheelchair: Manual Wheelchair activity did not occur: Safety/medical concerns         Wheelchair 50 feet with 2 turns activity    Assist    Wheelchair 50 feet with 2 turns activity did not occur: Safety/medical concerns       Wheelchair 150 feet activity     Assist  Wheelchair 150 feet activity did not occur: Safety/medical  concerns       Blood pressure (!) 154/59, pulse 83, temperature (!) 97.4 F (36.3 C), temperature source Axillary, resp. rate 18, height 5' 7"  (1.702 m), weight 85.9 kg, SpO2 99 %.    Medical Problem List and Plan: 1.  Rightt side hemiplegia, dysarthria, +/- aphasia secondary to left brain infarction.  MRI not completed due to pacemaker  -CT demonstrates hypoattenuation in left pons which is consistent with current presentation---increased right hp and increased difficulty with oro-pharyngeal control             -patient may shower             Plan is for SNF after CIR , Reduce to 15/7 schedule  Tolerating TF,  2.  Antithrombotics: -DVT/anticoagulation: Subcutaneous Lovenox             -antiplatelet therapy: Continue Aspirin 81 mg daily, Plavix 75 mg daily-3. Pain Management: ContinueTylenol as needed, pain is well controlled.  4. Mood with memory deficits.  Aricept 10 mg nightly             -antipsychotic agents: N/A 5. Neuropsych: This patient is NOT capable of making decisions on her own behalf. 6. Skin/Wound Care: Routine skin checks 7. Fluids/Electrolytes/Nutrition: Routine in and outs. Now tolerating TF D/C IVF8.  Hypertension.  Norvasc 2.5 mg daily, Coreg 3.125 mg twice daily.               Monitor with increased mobility Vitals:   01/06/20 2003 01/07/20 0412  BP: (!) 157/71 (!) 154/59  Pulse: 78 83  Resp: 16 18  Temp: 98.9 F (37.2 C) (!) 97.4 F (36.3 C)  SpO2: 77% 93%  systolic with mild/mod  elevation  Will monitor prior to any adjustment  9.  Diabetes mellitus with hyperglycemia.  Hemoglobin A1c 8.2.  Lantus insulin 10 units twice daily,               CBG (last 3)  Recent Labs    01/07/20 0409 01/07/20 0434 01/07/20 0908  GLUCAP 58* 78 215*  Have adjusted diabetic management now just receiving Lantus will change CBG to coincide with bolus feeds  10.  CAD with stenting as well as pacemaker.  Continue aspirin and Plavix. 11.  CKD stage IIIb.  Creatinine baseline  1.56.             Normalizing back to baseline  3/13- labs monday  12.  Chronic diastolic congestive heart failure.  Monitor for any signs of fluid overload             Daily weights   Filed Weights   01/05/20 0500 01/06/20 0500 01/07/20 0412  Weight: 83.6 kg 82.2 kg 85.9 kg   3/14- weight overall stable, however no weight today- will ask nursing.  13.  OSA.  CPAP. 14.  Hyperlipidemia.  Lipitor 15.  Chronic normocytic anemia.  Continue iron supplement  hgb 10 16.  Post stroke dysphagia: Dysphagia #2 thin liquids.  3/13- was on D2 thin lqiuids, but appears is now NPO- getting TFs by NGT- con't regimen and monitor- will need open to get PEG    Follow-up speech therapy.  Advance as tolerated  3/2: see diet/nutrition.  17.  UTI/Citrobacter/EColi:  Resolved    19. Hypercalcemia- ? Etiology, nephro following 20.  Severe Dysphagia, NPO-    POD #4 post PEG- wound without drainage or bleeding     21. R heel DTI  3/13- might have been blister initially but now DTI- pic as above- will cover for standing activities and wear PRAFO when not standing otherwise.   22. Sedation- actually has improved. Brainstem and subocrtical infarcts typically do not cause RAS depression but may trial ritalin     23. Urinary retention  Severe stroke plus immobility +/- diabetic cystopathy  , D/C flomax no effect D/C  Urecholine no improvement with escalating doses   24.  Stercoral ulcer avoid constipation  LOS: 24 days A FACE TO FACE EVALUATION WAS PERFORMED  Charlett Blake 01/07/2020, 11:40 AM

## 2020-01-08 ENCOUNTER — Inpatient Hospital Stay (HOSPITAL_COMMUNITY): Payer: Medicare Other | Admitting: Speech Pathology

## 2020-01-08 LAB — GLUCOSE, CAPILLARY
Glucose-Capillary: 127 mg/dL — ABNORMAL HIGH (ref 70–99)
Glucose-Capillary: 189 mg/dL — ABNORMAL HIGH (ref 70–99)
Glucose-Capillary: 240 mg/dL — ABNORMAL HIGH (ref 70–99)
Glucose-Capillary: 80 mg/dL (ref 70–99)
Glucose-Capillary: 200 mg/dL — ABNORMAL HIGH (ref 70–99)

## 2020-01-08 LAB — SARS CORONAVIRUS 2 (TAT 6-24 HRS): SARS Coronavirus 2: NEGATIVE

## 2020-01-08 MED ORDER — AMLODIPINE BESYLATE 5 MG PO TABS
5.0000 mg | ORAL_TABLET | Freq: Every day | ORAL | Status: DC
  Administered 2020-01-09 – 2020-01-10 (×2): 5 mg
  Filled 2020-01-08 (×2): qty 1

## 2020-01-08 NOTE — Progress Notes (Signed)
[place] PHYSICAL MEDICINE & REHABILITATION PROGRESS NOTE   Subjective/Complaints:   Pt remains lethargic    ROS: limited by cognition     Objective:   No results found. No results for input(s): WBC, HGB, HCT, PLT in the last 72 hours. No results for input(s): NA, K, CL, CO2, GLUCOSE, BUN, CREATININE, CALCIUM in the last 72 hours.  Intake/Output Summary (Last 24 hours) at 01/08/2020 1231 Last data filed at 01/08/2020 1220 Gross per 24 hour  Intake --  Output 3000 ml  Net -3000 ml     Physical Exam: Vital Signs Blood pressure (!) 160/60, pulse 70, temperature 97.6 F (36.4 C), temperature source Oral, resp. rate 18, height 5' 7"  (1.702 m), weight 85.9 kg, SpO2 99 %.   General: No acute distress Mood and affect are appropriate Heart: Regular rate and rhythm no rubs murmurs or extra sounds Lungs: Clear to auscultation, breathing unlabored, no rales or wheezes Abdomen: Positive bowel sounds, soft nontender to palpation, nondistended Extremities: No clubbing, cyanosis, or edema Skin: No evidence of breakdown, no evidence of rash G tube site CDI   Neurologic: Cranial nerves II through XII intact, motor strength is 4/5 in RIght and trace Left l deltoid, bicep, tricep, grip, hip flexor, knee extensors, ankle dorsiflexor and plantar flexor + left neglect         Assessment/Plan: 1. Functional deficits secondary to Left brainstem vs subcortical infarct which require 3+ hours per day of interdisciplinary therapy in a comprehensive inpatient rehab setting.  Physiatrist is providing close team supervision and 24 hour management of active medical problems listed below.  Physiatrist and rehab team continue to assess barriers to discharge/monitor patient progress toward functional and medical goals  Care Tool:  Bathing  Bathing activity did not occur: Safety/medical concerns Body parts bathed by patient: Face   Body parts bathed by helper: Right arm, Left arm, Chest,  Abdomen, Front perineal area, Buttocks, Right upper leg, Left upper leg, Right lower leg, Left lower leg     Bathing assist Assist Level: 2 Helpers     Upper Body Dressing/Undressing Upper body dressing   What is the patient wearing?: Hospital gown only    Upper body assist Assist Level: Total Assistance - Patient < 25%    Lower Body Dressing/Undressing Lower body dressing      What is the patient wearing?: Hospital gown only     Lower body assist Assist for lower body dressing: 2 Helpers     Toileting Toileting Toileting Activity did not occur (Clothing management and hygiene only): N/A (no void or bm)  Toileting assist Assist for toileting: Dependent - Patient 0%(3 helpers)     Transfers Chair/bed transfer  Transfers assist  Chair/bed transfer activity did not occur: Safety/medical concerns  Chair/bed transfer assist level: 2 Helpers     Locomotion Ambulation   Ambulation assist   Ambulation activity did not occur: Safety/medical concerns          Walk 10 feet activity   Assist  Walk 10 feet activity did not occur: Safety/medical concerns        Walk 50 feet activity   Assist Walk 50 feet with 2 turns activity did not occur: Safety/medical concerns         Walk 150 feet activity   Assist Walk 150 feet activity did not occur: Safety/medical concerns         Walk 10 feet on uneven surface  activity   Assist Walk 10 feet on uneven surfaces  activity did not occur: Safety/medical concerns         Wheelchair     Assist Will patient use wheelchair at discharge?: Yes Type of Wheelchair: Manual Wheelchair activity did not occur: Safety/medical concerns         Wheelchair 50 feet with 2 turns activity    Assist    Wheelchair 50 feet with 2 turns activity did not occur: Safety/medical concerns       Wheelchair 150 feet activity     Assist  Wheelchair 150 feet activity did not occur: Safety/medical  concerns       Blood pressure (!) 160/60, pulse 70, temperature 97.6 F (36.4 C), temperature source Oral, resp. rate 18, height 5' 7"  (1.702 m), weight 85.9 kg, SpO2 99 %.    Medical Problem List and Plan: 1.  Rightt side hemiplegia, dysarthria, +/- aphasia secondary to left brain infarction.  MRI not completed due to pacemaker  -CT demonstrates hypoattenuation in left pons which is consistent with current presentation---increased right hp and increased difficulty with oro-pharyngeal control             -patient may shower             Plan is for SNF after CIR , Reduce to 15/7 schedule  Tolerating TF,  2.  Antithrombotics: -DVT/anticoagulation: Subcutaneous Lovenox             -antiplatelet therapy: Continue Aspirin 81 mg daily, Plavix 75 mg daily-3. Pain Management: ContinueTylenol as needed, pain is well controlled.  4. Mood with memory deficits.  Aricept 10 mg nightly             -antipsychotic agents: N/A 5. Neuropsych: This patient is NOT capable of making decisions on her own behalf. 6. Skin/Wound Care: Routine skin checks 7. Fluids/Electrolytes/Nutrition: Routine in and outs. Now tolerating TF D/C IVF8.  Hypertension.  Norvasc 2.5 mg daily, Coreg 3.125 mg twice daily.               Monitor with increased mobility Vitals:   01/07/20 2021 01/08/20 0531  BP: (!) 151/60 (!) 160/60  Pulse: 75 70  Resp: 18 18  Temp: 99.1 F (37.3 C) 97.6 F (36.4 C)  SpO2: 91% 50%  systolic with mild/mod  elevation  Increase amlodipine  9.  Diabetes mellitus with hyperglycemia.  Hemoglobin A1c 8.2.  Lantus insulin 10 units twice daily,               CBG (last 3)  Recent Labs    01/08/20 0613 01/08/20 0821 01/08/20 1146  GLUCAP 127* 189* 240*  Have adjusted diabetic management now just receiving Lantus will change CBG to coincide with bolus feeds  10.  CAD with stenting as well as pacemaker.  Continue aspirin and Plavix. 11.  CKD stage IIIb.  Creatinine baseline 1.56.              Normalizing back to baseline  3/13- labs monday  12.  Chronic diastolic congestive heart failure.  Monitor for any signs of fluid overload             Daily weights   Filed Weights   01/05/20 0500 01/06/20 0500 01/07/20 0412  Weight: 83.6 kg 82.2 kg 85.9 kg   3/14- weight overall stable, however no weight today- will ask nursing.  13.  OSA.  CPAP. 14.  Hyperlipidemia.  Lipitor 15.  Chronic normocytic anemia.  Continue iron supplement  hgb 10 16.  Post stroke dysphagia: Dysphagia #2 thin liquids.  3/13- was on D2 thin lqiuids, but appears is now NPO- getting TFs by NGT- con't regimen and monitor- will need open to get PEG    Follow-up speech therapy.  Advance as tolerated  3/2: see diet/nutrition.  17.  UTI/Citrobacter/EColi:  Resolved    19. Hypercalcemia- ? Etiology, nephro following 20.  Severe Dysphagia, NPO-    POD #5 post PEG- wound without drainage or bleeding     21. R heel DTI  3/13- might have been blister initially but now DTI- pic as above- will cover for standing activities and wear PRAFO when not standing otherwise.   22. Sedation- actually has improved. Brainstem and subocrtical infarcts typically do not cause RAS depression but may trial ritalin     23. Urinary retention  Severe stroke plus immobility +/- diabetic cystopathy  , D/C flomax no effect D/C  Urecholine no improvement with escalating doses   24.  Stercoral ulcer avoid constipation  LOS: 25 days A FACE TO FACE EVALUATION WAS PERFORMED  Charlett Blake 01/08/2020, 12:31 PM

## 2020-01-08 NOTE — Progress Notes (Signed)
Occupational Therapy Weekly Progress Note  Patient Details  Name: [name] MRN: [id number] Date of Birth: 09/29/1928  Beginning of progress report period: 12/30/2019 End of progress report period: 01/08/2020  Patient has met 1 of 2 short term goals.    Pt has made very limited progress towards LTGs. She continues to be limited by profound lethargy, dense Rt hemiparesis, apraxia, and cognitive deficits. Pt has also been exhibiting more symptoms of Rt hip/LE pain. Goals have been downgraded to Mod-Max overall. Continue POC with d/c plan SNF at this time.   Patient continues to demonstrate the following deficits: muscle weakness and muscle paralysis, decreased cardiorespiratoy endurance, abnormal tone, unbalanced muscle activation, decreased coordination and decreased motor planning, decreased midline orientation and decreased motor planning, decreased initiation, decreased attention, decreased awareness, decreased problem solving and decreased safety awareness and decreased sitting balance, decreased standing balance, decreased postural control and hemiplegia and therefore will continue to benefit from skilled OT intervention to enhance overall performance with BADL.  Patient progressing toward long term goals..  Continue plan of care.  OT Short Term Goals Week 3:  OT Short Term Goal 1 (Week 3): Pt will thread her affected arm into shirt sleeve with Mod A to improve Rt sided attention OT Short Term Goal 1 - Progress (Week 3): Not met OT Short Term Goal 2 (Week 3): Pt will release grip on bedrail during functional bed mobility related to dressing or toileting with no more than mod multimodal cuing OT Short Term Goal 2 - Progress (Week 3): Met Week 4:  OT Short Term Goal 1 (Week 4): Pt will maintain sustained attention ~3 minutes during 1 self care task with no more than mod cuing OT Short Term Goal 2 (Week 4): Pt will utilize joint protection strategies with Mod A duing x2 consecutive  sessions     Therapy Documentation Precautions:  Precautions Precautions: Fall Restrictions Weight Bearing Restrictions: No Vital Signs: Therapy Vitals Temp: 98.2 F (36.8 C) Temp Source: Oral Pulse Rate: 79 Resp: 17 BP: (!) 133/57 Patient Position (if appropriate): Lying Oxygen Therapy SpO2: 99 % O2 Device: Room Air ADL:     Therapy/Group: Individual Therapy  Consepcion Utt A Jill Ruppe 01/08/2020, 4:43 PM

## 2020-01-08 NOTE — Progress Notes (Signed)
Speech Language Pathology Daily Session Note  Patient Details  Name: [name] MRN: [id number] Date of Birth: November 28, 1927  Today's Date: 01/08/2020 SLP Individual Time: 0539-7673 SLP Individual Time Calculation (min): 30 min  Short Term Goals: Week 4: SLP Short Term Goal 1 (Week 4): Pt will consume therapeutic trials of ice chips and/or thin liquids with efficient oral manipulation of bolus and timely swallow initiation with Max A multimodal cues. SLP Short Term Goal 2 (Week 4): Pt will increase speech intelligibility to 60% at the word level with Max A multimodal cues for use of increased vocal intensity. SLP Short Term Goal 3 (Week 4): Pt will sustain attentoin to functional tasks for 10 minute intervals with Mod A verbal/visual cues. SLP Short Term Goal 4 (Week 4): Pt will demonstrate problem solving during basic faimliar tasks with Mod A verbal/visual cues. SLP Short Term Goal 5 (Week 4): Pt will recall biographical information with Max A multimodal cues for recall and use of strategies.  Skilled Therapeutic Interventions:  Pt was seen for skilled ST targeting goals for dysphagia and communication.  Upon arrival, pt was resting in bed, continuously voicing on exhalation.  Pt denied being in pain; however, when therapist moved her right arm her RR increased, her vocalizations became louder, and she grimaced indicating pain.  NT reports pt had likely been wearing hard blue hand/wrist brace since yesterday.  With brace removed, pt reports improvement in pain.  LPN made aware and brace was left off.  Pt needed max cues to swallow her saliva or hand over hand assist to wipe anterior spillage of saliva from the oral cavity.   Pt had limited initiation of functional communication on this date and required max assist/choice of two to convey needs and wants to therapist.  She perseveratively responded "yea" to every question even when questions were not yes/no structured.  Pt became increasingly  fatigued and less engaged as session progressed.  As a result, session was ended early.  Pt was left in bed with bed alarm set and call bell within reach.    Pain Pain Assessment Pain Scale: Faces Faces Pain Scale: Hurts even more Pain Type: Acute pain Pain Location: Wrist Pain Orientation: Right Pain Descriptors / Indicators: Grimacing;Moaning Pain Intervention(s): Repositioned;RN made aware  Therapy/Group: Individual Therapy  Garnet Overfield, Selinda Orion 01/08/2020, 11:34 AM

## 2020-01-09 ENCOUNTER — Inpatient Hospital Stay (HOSPITAL_COMMUNITY): Payer: Medicare Other | Admitting: Occupational Therapy

## 2020-01-09 ENCOUNTER — Inpatient Hospital Stay (HOSPITAL_COMMUNITY): Payer: Medicare Other | Admitting: Speech Pathology

## 2020-01-09 ENCOUNTER — Inpatient Hospital Stay (HOSPITAL_COMMUNITY): Payer: Medicare Other | Admitting: *Deleted

## 2020-01-09 LAB — BASIC METABOLIC PANEL
Anion gap: 10 (ref 5–15)
BUN: 21 mg/dL (ref 8–23)
CO2: 27 mmol/L (ref 22–32)
Calcium: 9.9 mg/dL (ref 8.9–10.3)
Chloride: 104 mmol/L (ref 98–111)
Creatinine, Ser: 0.75 mg/dL (ref 0.44–1.00)
GFR calc Af Amer: >60 mL/min (ref >60)
GFR calc non Af Amer: >60 mL/min (ref >60)
Glucose, Bld: 97 mg/dL (ref 70–99)
Potassium: 4.5 mmol/L (ref 3.5–5.1)
Sodium: 141 mmol/L (ref 135–145)

## 2020-01-09 LAB — CBC WITH DIFFERENTIAL/PLATELET
Abs Immature Granulocytes: 0.02 10*3/uL (ref 0.00–0.07)
Basophils Absolute: 0.1 10*3/uL (ref 0.0–0.1)
Basophils Relative: 1 %
Eosinophils Absolute: 0.2 10*3/uL (ref 0.0–0.5)
Eosinophils Relative: 3 %
HCT: 29.7 % — ABNORMAL LOW (ref 36.0–46.0)
Hemoglobin: 9.1 g/dL — ABNORMAL LOW (ref 12.0–15.0)
Immature Granulocytes: 0 %
Lymphocytes Relative: 29 %
Lymphs Abs: 2.4 10*3/uL (ref 0.7–4.0)
MCH: 29.2 pg (ref 26.0–34.0)
MCHC: 30.6 g/dL (ref 30.0–36.0)
MCV: 95.2 fL (ref 80.0–100.0)
Monocytes Absolute: 0.7 10*3/uL (ref 0.1–1.0)
Monocytes Relative: 9 %
Neutro Abs: 4.7 10*3/uL (ref 1.7–7.7)
Neutrophils Relative %: 58 %
Platelets: 246 10*3/uL (ref 150–400)
RBC: 3.12 MIL/uL — ABNORMAL LOW (ref 3.87–5.11)
RDW: 12.8 % (ref 11.5–15.5)
WBC: 8.1 10*3/uL (ref 4.0–10.5)
nRBC: 0 % (ref 0.0–0.2)

## 2020-01-09 LAB — GLUCOSE, CAPILLARY
Glucose-Capillary: 120 mg/dL — ABNORMAL HIGH (ref 70–99)
Glucose-Capillary: 210 mg/dL — ABNORMAL HIGH (ref 70–99)
Glucose-Capillary: 206 mg/dL — ABNORMAL HIGH (ref 70–99)
Glucose-Capillary: 109 mg/dL — ABNORMAL HIGH (ref 70–99)

## 2020-01-09 MED ORDER — LIDOCAINE 5 % EX PTCH
1.0000 | MEDICATED_PATCH | CUTANEOUS | Status: DC
  Administered 2020-01-09 – 2020-01-10 (×2): 1 via TRANSDERMAL
  Filled 2020-01-09 (×2): qty 1

## 2020-01-09 MED ORDER — SCOPOLAMINE 1 MG/3DAYS TD PT72
1.0000 | MEDICATED_PATCH | TRANSDERMAL | Status: DC
  Administered 2020-01-09: 1.5 mg via TRANSDERMAL
  Filled 2020-01-09: qty 1

## 2020-01-09 NOTE — Plan of Care (Signed)
  Problem: Sit to Stand Goal: LTG:  Patient will perform sit to stand in prep for activites of daily living with assistance level (OT) Description: LTG:  Patient will perform sit to stand in prep for activites of daily living with assistance level (OT) Outcome: Not Met (add Reason) Note: Due to slow progress    Problem: RH Bathing Goal: LTG Patient will bathe all body parts with assist levels (OT) Description: LTG: Patient will bathe all body parts with assist levels (OT) Outcome: Not Met (add Reason) Note: Goal not met due to slow progress    Problem: RH Functional Use of Upper Extremity Goal: LTG Patient will use RT/LT upper extremity as a (OT) Description: LTG: Patient will use right/left upper extremity as a stabilizer/gross assist/diminished/nondominant/dominant level with assist, with/without cues during functional activity (OT) Outcome: Not Met (add Reason) Note: Goal not met due to slow progress    Problem: RH Memory Goal: LTG Patient will demonstrate ability for day to day recall/carry over during activities of daily living with assistance level (OT) Description: LTG:  Patient will demonstrate ability for day to day recall/carry over during activities of daily living with assistance level (OT). Outcome: Not Met (add Reason) Note: Goal not met due to slow progress    Problem: RH Balance Goal: LTG: Patient will maintain dynamic sitting balance (OT) Description: LTG:  Patient will maintain dynamic sitting balance with assistance during activities of daily living (OT) Outcome: Completed/Met   Problem: RH Grooming Goal: LTG Patient will perform grooming w/assist,cues/equip (OT) Description: LTG: Patient will perform grooming with assist, with/without cues using equipment (OT) Outcome: Completed/Met   Problem: RH Dressing Goal: LTG Patient will perform upper body dressing (OT) Description: LTG Patient will perform upper body dressing with assist, with/without cues  (OT). Outcome: Completed/Met Goal: LTG Patient will perform lower body dressing w/assist (OT) Description: LTG: Patient will perform lower body dressing with assist, with/without cues in positioning using equipment (OT) Outcome: Completed/Met

## 2020-01-09 NOTE — Progress Notes (Signed)
Physical Therapy Session Note  Patient Details  Name: [name] MRN: [id number] Date of Birth: 11-Oct-1928  Today's Date: 01/09/2020 PT Individual Time: 1420-1500 PT Individual Time Calculation (min): 40 min   Short Term Goals: Week 3:  PT Short Term Goal 1 (Week 3): Pt will perform bed mobility with max assist PT Short Term Goal 2 (Week 3): Pt will transfer to [facility] with mod assist PT Short Term Goal 3 (Week 3): Pt will sustain standing in parallel bars up to 45 seconds.  Skilled Therapeutic Interventions/Progress Updates: Pt presented in TIS agreeable to therapy. Pt transported to ortho gym and session focused on use of standing frame for wt bearing through RLE. Pt tolerated standing approx 8 min with pt performing reaching tasks with LUE. PTA providing tactile cues and intermittent facilitation to correct lean to R and improve upright posture. Pt was able to reach both to L and R with LUE and maintain upright posture. Pt then noted to have episode of bowel incontinence while in standing. Pt returned to TIS and transported back to room. To minimize shearing forces with incontinent brief performed STS in [place], requiring maxA x 2. Pt transferred to bed and performed sit to supine with maxA. Pt then performed rolling L/R maxA with second person present to stabilize pt as PTA performed peri-care. Pt left in care of nsg to perform bladder scan.      Therapy Documentation Precautions:  Precautions Precautions: Fall Restrictions Weight Bearing Restrictions: No General:   Vital Signs: Therapy Vitals Temp: 98.3 F (36.8 C) Temp Source: Oral Pulse Rate: 79 Resp: 20 BP: (!) 144/60 Patient Position (if appropriate): Sitting Oxygen Therapy SpO2: 100 % O2 Device: Room Air   Therapy/Group: Individual Therapy  Rakan Soffer 01/09/2020, 4:36 PM

## 2020-01-09 NOTE — Progress Notes (Signed)
[facility] PHYSICAL MEDICINE & REHABILITATION PROGRESS NOTE   Subjective/Complaints: Pt is alert and makes sounds but does not speak words.  Nods head yes in response to my questions. As per staff, hip pain has been bothering her. She is drooling and as per staff this has been bothersome to her as well.    ROS: limited by cognition     Objective:   No results found. Recent Labs    01/09/20 0600  WBC 8.1  HGB 9.1*  HCT 29.7*  PLT 246   Recent Labs    01/09/20 0600  NA 141  K 4.5  CL 104  CO2 27  GLUCOSE 97  BUN 21  CREATININE 0.75  CALCIUM 9.9    Intake/Output Summary (Last 24 hours) at 01/09/2020 1329 Last data filed at 01/09/2020 0701 Gross per 24 hour  Intake -  Output 1400 ml  Net -1400 ml     Physical Exam: Vital Signs Blood pressure (!) 144/60, pulse 79, temperature 98.3 F (36.8 C), temperature source Oral, resp. rate 20, height 5' 7"  (1.702 m), weight 85.6 kg, SpO2 100 %. General: No acute distress. Appears to be in pain during transfer from bed to chair.  Mood and affect are appropriate Heart: Regular rate and rhythm no rubs murmurs or extra sounds Lungs: Clear to auscultation, breathing unlabored, no rales or wheezes Abdomen: Positive bowel sounds, soft nontender to palpation, nondistended Extremities: No clubbing, cyanosis, or edema Skin: No evidence of breakdown, no evidence of rash G tube site CDI  Neurologic: Cranial nerves II through XII intact, motor strength is 4/5 in RIght and trace Left l deltoid, bicep, tricep, grip, hip flexor, knee extensors, ankle dorsiflexor and plantar flexor + left neglect   Assessment/Plan: 1. Functional deficits secondary to Left brainstem vs subcortical infarct which require 3+ hours per day of interdisciplinary therapy in a comprehensive inpatient rehab setting.  Physiatrist is providing close team supervision and 24 hour management of active medical problems listed below.  Physiatrist and rehab team continue  to assess barriers to discharge/monitor patient progress toward functional and medical goals  Care Tool:  Bathing  Bathing activity did not occur: Safety/medical concerns Body parts bathed by patient: Face   Body parts bathed by helper: Right arm, Left arm, Chest, Abdomen, Front perineal area, Buttocks, Right upper leg, Left upper leg, Right lower leg, Left lower leg     Bathing assist Assist Level: 2 Helpers     Upper Body Dressing/Undressing Upper body dressing   What is the patient wearing?: Hospital gown only    Upper body assist Assist Level: Total Assistance - Patient < 25%    Lower Body Dressing/Undressing Lower body dressing      What is the patient wearing?: Hospital gown only     Lower body assist Assist for lower body dressing: 2 Helpers     Toileting Toileting Toileting Activity did not occur (Clothing management and hygiene only): N/A (no void or bm)  Toileting assist Assist for toileting: Dependent - Patient 0%(3 helpers)     Transfers Chair/bed transfer  Transfers assist  Chair/bed transfer activity did not occur: Safety/medical concerns  Chair/bed transfer assist level: 2 Helpers     Locomotion Ambulation   Ambulation assist   Ambulation activity did not occur: Safety/medical concerns          Walk 10 feet activity   Assist  Walk 10 feet activity did not occur: Safety/medical concerns        Walk 50  feet activity   Assist Walk 50 feet with 2 turns activity did not occur: Safety/medical concerns         Walk 150 feet activity   Assist Walk 150 feet activity did not occur: Safety/medical concerns         Walk 10 feet on uneven surface  activity   Assist Walk 10 feet on uneven surfaces activity did not occur: Safety/medical concerns         Wheelchair     Assist Will patient use wheelchair at discharge?: Yes Type of Wheelchair: Manual Wheelchair activity did not occur: Safety/medical concerns          Wheelchair 50 feet with 2 turns activity    Assist    Wheelchair 50 feet with 2 turns activity did not occur: Safety/medical concerns       Wheelchair 150 feet activity     Assist  Wheelchair 150 feet activity did not occur: Safety/medical concerns       Blood pressure (!) 144/60, pulse 79, temperature 98.3 F (36.8 C), temperature source Oral, resp. rate 20, height 5' 7"  (1.702 m), weight 85.6 kg, SpO2 100 %.    Medical Problem List and Plan: 1.  Rightt side hemiplegia, dysarthria, +/- aphasia secondary to left brain infarction.  MRI not completed due to pacemaker  -CT demonstrates hypoattenuation in left pons which is consistent with current presentation---increased right hp and increased difficulty with oro-pharyngeal control             -patient may shower             Plan is for SNF after CIR , Reduce to 15/7 schedule  Tolerating TF 2.  Antithrombotics: -DVT/anticoagulation: Subcutaneous Lovenox             -antiplatelet therapy: Continue Aspirin 81 mg daily, Plavix 75 mg daily 3. Pain Management: ContinueTylenol as needed. Added lidocaine patch for hip pain. 4. Mood with memory deficits.  Aricept 10 mg nightly             -antipsychotic agents: N/A 5. Neuropsych: This patient is NOT capable of making decisions on her own behalf. 6. Skin/Wound Care: Routine skin checks 7. Fluids/Electrolytes/Nutrition: Routine in and outs. Now tolerating TF D/C IVF8.  Hypertension.  Norvasc 2.5 mg daily, Coreg 3.125 mg twice daily.               Monitor with increased mobility Vitals:   01/09/20 0550 01/09/20 1322  BP: (!) 150/61 (!) 144/60  Pulse: 73 79  Resp: 18 20  Temp: 98.4 F (36.9 C) 98.3 F (36.8 C)  SpO2: 67% 591%  systolic with mild/mod  elevation  Increase amlodipine   3/22: BP elevated mildly. Continue to monitor.  9.  Diabetes mellitus with hyperglycemia.  Hemoglobin A1c 8.2.  Lantus insulin 10 units twice daily,               CBG (last 3)  Recent Labs     01/08/20 2029 01/09/20 0623 01/09/20 1131  GLUCAP 200* 120* 210*  Have adjusted diabetic management now just receiving Lantus will change CBG to coincide with bolus feeds   3/22: labile: continue to monitor.  10.  CAD with stenting as well as pacemaker.  Continue aspirin and Plavix. 11.  CKD stage IIIb.  Creatinine baseline 1.56.             Normalizing back to baseline  3/13- labs monday  12.  Chronic diastolic congestive heart failure.  Monitor for  any signs of fluid overload             Daily weights   Filed Weights   01/06/20 0500 01/07/20 0412 01/09/20 0550  Weight: 82.2 kg 85.9 kg 85.6 kg   3/14- weight overall stable, however no weight today- will ask nursing.  13.  OSA.  CPAP. 14.  Hyperlipidemia.  Lipitor 15.  Chronic normocytic anemia.  Continue iron supplement             hgb 10 16.  Post stroke dysphagia: Dysphagia #2 thin liquids.  3/13- was on D2 thin lqiuids, but appears is now NPO- getting TFs by [facility]- con't regimen and monitor- will need open to get PEG    Follow-up speech therapy.  Advance as tolerated  3/2: see diet/nutrition.  17.  UTI/Citrobacter/EColi:  Resolved    19. Hypercalcemia- ? Etiology, nephro following 20.  Severe Dysphagia, NPO-    POD #5 post PEG- wound without drainage or bleeding     21. R heel DTI  3/13- might have been blister initially but now DTI- pic as above- will cover for standing activities and wear PRAFO when not standing otherwise.   22. Sedation- actually has improved. Brainstem and subocrtical infarcts typically do not cause RAS depression but may trial ritalin     23. Urinary retention  Severe stroke plus immobility +/- diabetic cystopathy  , D/C flomax no effect D/C  Urecholine no improvement with escalating doses   24.  Stercoral ulcer avoid constipation  25. Drooling: Added Scopolamine patch.  LOS: 26 days A FACE TO FACE EVALUATION WAS PERFORMED  Martha Clan P Edgar Reisz 01/09/2020, 1:29 PM

## 2020-01-09 NOTE — Progress Notes (Signed)
Speech Language Pathology Daily Session Note  Patient Details  Name: [name] MRN: [id number] Date of Birth: Apr 07, 1928  Today's Date: 01/09/2020 SLP Individual Time: 4098-1191 SLP Individual Time Calculation (min): 45 min  Short Term Goals: Week 4: SLP Short Term Goal 1 (Week 4): Pt will consume therapeutic trials of ice chips and/or thin liquids with efficient oral manipulation of bolus and timely swallow initiation with Max A multimodal cues. SLP Short Term Goal 2 (Week 4): Pt will increase speech intelligibility to 60% at the word level with Max A multimodal cues for use of increased vocal intensity. SLP Short Term Goal 3 (Week 4): Pt will sustain attentoin to functional tasks for 10 minute intervals with Mod A verbal/visual cues. SLP Short Term Goal 4 (Week 4): Pt will demonstrate problem solving during basic faimliar tasks with Mod A verbal/visual cues. SLP Short Term Goal 5 (Week 4): Pt will recall biographical information with Max A multimodal cues for recall and use of strategies.  Skilled Therapeutic Interventions: Pt was seen for skilled ST targeting dysphagia and cognition. Pt accepted trials of ice chips after oral care without overt s/sx aspiration, however she required step-by-step instructions for mastication and to propel bolus posteriorly, as well as initiation of swallow sequence. Moderate anterior spillage of boluses and saliva noted throughout session, requiring Max A to correct/manage. Although Max A verbal cues required for using a rag to wipe saliva spilling anteriorly, no hand over and assistance required, which is an improvement over last session. Pt required Mod a verbal and visual cues to verbally identify colors, but Max A to sort them (black and red). Moderate question cues provided in order to pt to recall biographical information such as her address and birthday, etc. Pt required Mod a verbal cues for redirection in order to sustain attention to tasks  throughout session today. Pt left laying in bed with alarm set and needs within reach. Continue per current plan of care.        Pain Pain Assessment Pain Scale: 0-10 Pain Score: 0-No pain  Therapy/Group: Individual Therapy  Arbutus Leas 01/09/2020, 7:14 AM

## 2020-01-09 NOTE — Progress Notes (Signed)
Occupational Therapy Session Note  Patient Details  Name: [name] MRN: [id number] Date of Birth: 1928/04/07  Today's Date: 01/09/2020 OT Individual Time: 1017-1057 OT Individual Time Calculation (min): 40 min   Short Term Goals: Week 4:  OT Short Term Goal 1 (Week 4): Pt will maintain sustained attention ~3 minutes during 1 self care task with no more than mod cuing OT Short Term Goal 2 (Week 4): Pt will utilize joint protection strategies with Mod A duing x2 consecutive sessions     Skilled Therapeutic Interventions/Progress Updates:    Pt greeted in TIS with RN present administering medication via PEG. Pt oriented to self, situation, and place (via mumbling). Disoriented to time. Started with retrograde massage to Rt hand for edema mgt, transitioned to PROM of Rt UE, emphasizing forearm supination, wrist extension, and extension of digits due to tightness. Worked on sustained attention and oralmotor control while singing aloud together during ROM (Amazing Shirlee [place] and This [place]). Pt occasionally mumbled words of songs but otherwise kept her eyes closed. She needed vcs throughout session to close mouth and swallow due to increased secretions spilling onto towel on her shirt. At end of session OT positioned Rt UE elevated on pillows with elbow slightly extended. Soft call bell within reach, safety felt fastened, pt reclined for comfort and safety in TIS.   Therapy Documentation Precautions:  Precautions Precautions: Fall Restrictions Weight Bearing Restrictions: No Vital Signs:  Pain: Pt grimacing during portions of ROM or even with light touch of her affected arm, pt visibly more at ease when ROM was completed while OT sang to her. RN provided pain medicine via PEG at start of session. Per RN, pt was exhibiting signs of pain this AM also Pain Assessment Pain Scale: 0-10 Pain Score: 0-No pain Faces Pain Scale: Hurts a little bit ADL:       Therapy/Group:  Individual Therapy  Lawerence Dery A [name] 01/09/2020, 12:46 PM

## 2020-01-09 NOTE — Progress Notes (Signed)
Occupational Therapy Session Note  Patient Details  Name: [name] MRN: [id number] Date of Birth: 1928-05-27  Today's Date: 01/09/2020 OT Individual Time: 6808-8110 OT Individual Time Calculation (min): 56 min   Skilled Therapeutic Interventions/Progress Updates:    Pt greeted semi-reclined in bed, easy to wake this morning. Worked on bed mobility rolling L and R with total A +2 to check brief and don -pants. Brief was clean and total A +2 to don pants at bed level. Pt yelling in pain when rolling and with any movement to L hip. Total A to come to sitting EOB. Worked on sitting balance at EOB with mod A overall. Total A +2 slideboard transfer to TIS wc. PT brought to the sink and was able to use L UE to help wash chest and under L arm. OT handed pt deodorant, but she needed max A to don deodorant. Total A to don shirt. OT brought pt to [facility] to have her look out the window at [place]. Pt needed max verbal cues to maintain alertness to look out the window. OT provided gentle ROM and stretching to B UEs with pt w/ minimal alertness. OT returned pt to room and placed alarm belt and lap tray for UE support. Soft push call light in place and needs met.   Therapy Documentation Precautions:  Precautions Precautions: Fall Restrictions Weight Bearing Restrictions: No Pain: Pt wincing in pain with any movement of L hip. Repositioned and rest for pain management.   Therapy/Group: Individual Therapy  Valma Cava 01/09/2020, 12:53 PM

## 2020-01-09 NOTE — Progress Notes (Signed)
Physical Therapy Discharge Summary  Patient Details  Name: [name] MRN: [id number] Date of Birth: [birth year]  Today's Date: 01/09/2020 PT Individual Time: 1420-1500    Patient has met 1 of 8 long term goals due to improved activity tolerance, improved postural control, improved attention and improved awareness.  Patient to discharge at a wheelchair level Max Assist.   Patient's care partner is independent to provide the necessary physical and cognitive assistance at discharge.  Reasons goals not met: Pt with limited progress during course of therapy. Pt was able to demonstrate improved awareness to participate in tasks however limited by lethargy and poor endurance. Pt did demonstrate improved awareness during tasks and improved postural control in sitting with sitting balance minA. Pt will be d/c to SNF for further progression of therapies.   Recommendation:  Patient will benefit from ongoing skilled PT services in skilled nursing facility setting to continue to advance safe functional mobility, address ongoing impairments in balance, safety, transfers, awareness, and minimize fall risk.  Equipment: No equipment provided  Reasons for discharge: lack of progress toward goals and discharge from hospital  Patient/family agrees with progress made and goals achieved: Yes  PT Discharge Precautions/Restrictions Precautions Precautions: Fall Restrictions Weight Bearing Restrictions: No Vital Signs Therapy Vitals Temp: 98.3 F (36.8 C) Temp Source: Oral Pulse Rate: 79 Resp: 20 BP: (!) 144/60 Patient Position (if appropriate): Sitting Oxygen Therapy SpO2: 100 % O2 Device: Room Air  Cognition Overall Cognitive Status: History of cognitive impairments - at baseline Arousal/Alertness: Awake/alert Orientation Level: Oriented to person Sensation Sensation Light Touch: Impaired Detail Light Touch Impaired Details: Impaired RUE;Impaired LUE;Impaired RLE;Impaired LLE Motor   Motor Motor: Hemiplegia;Motor perseverations;Motor impersistence Motor - Discharge Observations: minimal change, dense R hemi  Mobility Bed Mobility Bed Mobility: Rolling Right;Rolling Left;Sit to Supine;Supine to Sit Rolling Right: Maximal Assistance - Patient 25-49% Rolling Left: Total Assistance - Patient < 25% Supine to Sit: Maximal Assistance - Patient - Patient 25-49% Sit to Supine: Maximal Assistance - Patient 25-49%;Total Assistance - Patient < 25% Transfers Transfers: Sit to [employer] via Geophysicist/field seismologist Sit to Stand: Maximal Assistance - Patient 25-49%;Dependent - mechanical lift Transfer (Assistive device): Other (Comment)(parallel bars or wall rail) Transfer via Lift Equipment: Probation officer Ambulation: No Gait Gait: No Stairs / Additional Locomotion Stairs: No Wheelchair Mobility Wheelchair Mobility: No  Trunk/Postural Assessment  Cervical Assessment Cervical Assessment: Exceptions to WFL(forward head) Thoracic Assessment Thoracic Assessment: Exceptions to WFL(rounded shoudlers) Lumbar Assessment Lumbar Assessment: Exceptions to WFL(posterior pelvic tilt) Postural Control Postural Control: Deficits on evaluation(R lateral lean)  Balance Balance Balance Assessed: Yes Static Sitting Balance Static Sitting - Balance Support: Feet supported;Left upper extremity supported Static Sitting - Level of Assistance: 4: Min assist Dynamic Sitting Balance Sitting balance - Comments: Mod A dynamic sitting Extremity Assessment  RUE Assessment RUE Assessment: Exceptions to WFL(Edematous) RUE Body System: Neuro Brunstrum levels for arm and hand: Arm;Hand Brunstrum level for arm: Stage I Presynergy Brunstrum level for hand: Stage I Flaccidity RUE Tone RUE Tone: Flaccid LUE Assessment LUE Assessment: Exceptions to WFL(difficult to assess due to motor planning deficits) RLE Assessment RLE Assessment: Exceptions to [facility] General Strength Comments: 2-/5 hip  extension. 0/5 all other MMT performed supine in bed. LLE Assessment LLE Assessment: Exceptions to [facility] General Strength Comments: grossly 4/5 proximal to distal    Rosita DeChalus 01/09/2020, 4:47 PM    Barrie Folk PT, DPT  01/11/20 8:02 AM

## 2020-01-09 NOTE — Discharge Summary (Signed)
Occupational Therapy Discharge Summary  Patient Details  Name: [name] MRN: [id number] Date of Birth: 04-29-1928  Patient has met 4 of 8 long term goals due to improved activity tolerance.  Patient to discharge at overall Total Assist level.  Patient's next venue of care is SNF.  Several goals not met due to profound lethargy, apraxia, dense Rt hemiparesis, and cognitive deficits   Recommendation:  Patient will benefit from ongoing skilled OT services in skilled nursing facility setting to continue to advance functional skills in the area of BADL.  Equipment: No equipment provided  Reasons for discharge: discharge from hospital  Patient/family agrees with progress made and goals achieved: Yes  OT Discharge Vital Signs Therapy Vitals Temp: 98.3 F (36.8 C) Temp Source: Oral Pulse Rate: 79 Resp: 20 BP: (!) 144/60 Patient Position (if appropriate): Sitting Oxygen Therapy SpO2: 100 % O2 Device: Room Air Pain Pain Assessment Pain Scale: 0-10 Pain Score: 0-No pain ADL ADL Eating: NPO Grooming: Maximal assistance Where Assessed-Grooming: Wheelchair Upper Body Bathing: Maximal assistance Where Assessed-Upper Body Bathing: Wheelchair Lower Body Bathing: Dependent Where Assessed-Lower Body Bathing: Bed level Upper Body Dressing: Maximal assistance Where Assessed-Upper Body Dressing: Wheelchair Lower Body Dressing: Dependent Where Assessed-Lower Body Dressing: Bed level Toileting: Dependent Where Assessed-Toileting: Bedside Commode Toilet Transfer: Dependent(using Stedy) Science writer: Radiographer, therapeutic: Not assessed Balance Balance Balance Assessed: Yes Dynamic Sitting Balance Sitting balance - Comments: Mod Vicki dynamic sitting Extremity/Trunk Assessment RUE Assessment RUE Assessment: Exceptions to WFL(Edematous) RUE Body System: Neuro Brunstrum levels for arm and hand: Arm;Hand Brunstrum level for arm: Stage I  Presynergy Brunstrum level for hand: Stage I Flaccidity RUE Tone RUE Tone: Flaccid LUE Assessment LUE Assessment: Exceptions to WFL(difficult to assess due to motor planning deficits)   [name] [name] 01/09/2020, 4:21 PM

## 2020-01-10 LAB — GLUCOSE, CAPILLARY
Glucose-Capillary: 77 mg/dL (ref 70–99)
Glucose-Capillary: 185 mg/dL — ABNORMAL HIGH (ref 70–99)

## 2020-01-10 MED ORDER — AMLODIPINE BESYLATE 5 MG PO TABS: 5.0000 mg | ORAL_TABLET | Freq: Every day | ORAL | Status: DC

## 2020-01-10 MED ORDER — ATORVASTATIN CALCIUM 80 MG PO TABS: 80.0000 mg | ORAL_TABLET | Freq: Every morning | ORAL | Status: DC

## 2020-01-10 MED ORDER — FREE WATER: 200.0000 mL | Freq: Three times a day (TID) | Status: DC

## 2020-01-10 MED ORDER — LATANOPROST 0.005 % OP SOLN: 1.0000 [drp] | Freq: Every day | OPHTHALMIC | 12 refills | Status: DC

## 2020-01-10 MED ORDER — FERROUS SULFATE 300 (60 FE) MG/5ML PO SYRP: 300.0000 mg | ORAL_SOLUTION | Freq: Every day | ORAL | 3 refills | Status: DC

## 2020-01-10 MED ORDER — SCOPOLAMINE 1 MG/3DAYS TD PT72: 1.0000 | MEDICATED_PATCH | TRANSDERMAL | 12 refills | Status: DC

## 2020-01-10 MED ORDER — DORZOLAMIDE HCL 2 % OP SOLN: 1.0000 [drp] | Freq: Three times a day (TID) | OPHTHALMIC | 12 refills | Status: DC

## 2020-01-10 MED ORDER — LIDOCAINE 5 % EX PTCH: 1.0000 | MEDICATED_PATCH | CUTANEOUS | 0 refills | Status: DC

## 2020-01-10 MED ORDER — DONEPEZIL HCL 10 MG PO TABS: 10.0000 mg | ORAL_TABLET | Freq: Every day | ORAL | Status: DC

## 2020-01-10 MED ORDER — BRIMONIDINE TARTRATE 0.2 % OP SOLN: 1.0000 [drp] | Freq: Two times a day (BID) | OPHTHALMIC | 12 refills | Status: DC

## 2020-01-10 MED ORDER — SENNOSIDES-DOCUSATE SODIUM 8.6-50 MG PO TABS: 1.0000 | ORAL_TABLET | Freq: Two times a day (BID) | ORAL | Status: DC

## 2020-01-10 MED ORDER — JEVITY 1.5 CAL/FIBER PO LIQD: 237.0000 mL | Freq: Every day | ORAL | Status: DC

## 2020-01-10 MED ORDER — POLYETHYLENE GLYCOL 3350 17 G PO PACK: 17.0000 g | PACK | Freq: Two times a day (BID) | ORAL | 0 refills | Status: DC

## 2020-01-10 MED ORDER — TRAMADOL HCL 50 MG PO TABS: 50.0000 mg | ORAL_TABLET | Freq: Two times a day (BID) | ORAL | 0 refills | Status: DC | PRN

## 2020-01-10 MED ORDER — CLOPIDOGREL BISULFATE 75 MG PO TABS: 75.0000 mg | ORAL_TABLET | Freq: Every day | ORAL | Status: DC

## 2020-01-10 MED ORDER — CARVEDILOL 3.125 MG PO TABS: 3.1250 mg | ORAL_TABLET | Freq: Two times a day (BID) | ORAL | Status: DC

## 2020-01-10 MED ORDER — INSULIN GLARGINE 100 UNIT/ML ~~LOC~~ SOLN: 19.0000 [IU] | Freq: Two times a day (BID) | SUBCUTANEOUS | 11 refills | Status: DC

## 2020-01-10 MED ORDER — ASPIRIN 81 MG PO CHEW: 81.0000 mg | CHEWABLE_TABLET | Freq: Every day | ORAL | Status: DC

## 2020-01-10 MED ORDER — PANTOPRAZOLE SODIUM 40 MG PO PACK: 40.0000 mg | PACK | Freq: Every day | ORAL | Status: DC

## 2020-01-10 MED ORDER — ACETAMINOPHEN 160 MG/5ML PO SOLN: 650.0000 mg | ORAL | 0 refills | Status: DC | PRN

## 2020-01-10 NOTE — Care Management (Signed)
   The overall goal for the admission was met for:   Discharge location: [facility]  Length of Stay: 23  Days with discharge 01/10/20  Discharge activity level: Max-Total assist  Home/community participation: No  Services provided included: MD, RD, PT, OT, SLP, RN, CM, TR, Pharmacy, Neuropsych and SW  Financial Services: Medicare  Follow-up services arranged: Other: SNF-[facility]  Comments (or additional information): [facility] [address] [place],  [address] [phone]  Patient/Family verbalized understanding of follow-up arrangements: Yes  Individual responsible for coordination of the follow-up plan: Daughter:Beverly Armandina Gemma [phone]   Margarito Liner

## 2020-01-10 NOTE — Discharge Summary (Signed)
Physician Discharge Summary  Patient ID: [name] MRN: [id number] DOB/AGE: 1928-01-09 86 y.o.  Admit date: 12/14/2019 Discharge date: 01/10/2020  Discharge Diagnoses:  Principal Problem:   Small vessel cerebrovascular accident (CVA) ([place]) Active Problems:   Type 2 diabetes mellitus (HCC)   Hypercalcemia   Hypernatremia   Dysphagia Hypertension CKD stage III CAD with stenting Chronic diastolic congestive heart failure Proctitis  Discharged Condition: Stable  Significant Diagnostic Studies: CT ABDOMEN PELVIS WO CONTRAST  Result Date: 12/29/2019 CLINICAL DATA:  86 year old with dysphagia. Evaluate anatomy for possible percutaneous gastrostomy tube placement. EXAM: CT ABDOMEN AND PELVIS WITHOUT CONTRAST TECHNIQUE: Multidetector CT imaging of the abdomen and pelvis was performed following the standard protocol without IV contrast. COMPARISON:  CT abdomen 10/17/2012 FINDINGS: Lower chest: Motion artifact at the lung bases without significant airspace disease or consolidation. Small focal density in medial left lower lobe probably represents conglomeration of structures and there may be a small amount of atelectasis in this area. No large pleural effusions. Cardiac pacer wires with post CABG changes. Hepatobiliary: Cholelithiasis. No gallbladder distension. Normal appearance of the liver. Pancreas: Unremarkable. No pancreatic ductal dilatation or surrounding inflammatory changes. Spleen: Normal appearance of the spleen. Adrenals/Urinary Tract: Normal adrenal glands. Negative for kidney stones. Negative for hydronephrosis. No suspicious renal lesions. Urinary bladder is unremarkable. Stomach/Bowel: Nasogastric tube terminates in the distal stomach near the pylorus and duodenal bulb. Normal appearance of the duodenum. There is transverse colon anterior to the stomach body. Evidence for extensive wall thickening involving the rectum. Concern for focal fluid or edema within the left side of the  rectal wall, seen on sequence 3, image 73. There is also extensive edema in the perirectal region and presacral region.No evidence for a bowel obstruction. Vascular/Lymphatic: Abdominal aorta is heavily calcified without aneurysm. No abdominopelvic lymphadenopathy. Reproductive: Numerous calcified fibroids throughout the uterus. Largest fibroid is along the posterior aspect of the uterus and measures up to 5.0 cm. There are also prominent calcifications in the region of the left adnexa and left ovary. Other: Significant edema in the perirectal region and presacral space. Negative for ascites. Negative for free air. Small amount of subcutaneous gas in the left anterior abdomen likely associated with an injection sites. Musculoskeletal: Posterior disc space narrowing at L3-L4. Mild disc space narrowing with endplate irregularity at L4-L5. Facet arthropathy in the lower lumbar spine. Disc space narrowing and endplate irregularity at T10-T11. IMPRESSION: 1. Wall thickening and inflammatory changes involving the rectum. In particular, there appears to be focal thickening or fluid along the left side of the rectum. Findings are suggestive for proctitis. Consider GI consultation. 2. There is transverse colon intervening between the anterior abdominal wall and the stomach. Patient may not be a candidate for percutaneous gastrostomy tube placement due to this anatomy and would recommend visualization of the transverse colon for an attempted percutaneous gastrostomy tube placement. 3. Multiple calcified uterine fibroids. 4. Cholelithiasis.  No evidence for gallbladder inflammation. Electronically Signed   By: Markus Daft M.D.   On: 12/29/2019 07:59   DG Chest 2 View  Result Date: 12/15/2019 CLINICAL DATA:  Shortness of breath. EXAM: CHEST - 2 VIEW COMPARISON:  Chest x-ray 12/07/2019. FINDINGS: AICD in stable position. Prior CABG. Cardiomegaly. Low lung volumes. Very mild bilateral interstitial prominence noted. Mild  interstitial edema and/or pneumonitis cannot be excluded. No focal infiltrate. No pleural effusion or pneumothorax. Degenerative changes thoracic spine and both shoulders. IMPRESSION: 1.  AICD in stable position.  Prior CABG. 2. Low lung volumes. Very  mild bilateral interstitial prominence noted. Mild interstitial edema and/or pneumonitis cannot be excluded. Electronically Signed   By: Marcello Moores  Register   On: 12/15/2019 14:01   DG Abd 1 View  Result Date: 01/03/2020 CLINICAL DATA:  Evaluate barium prior to potential percutaneous gastrostomy tube placement. EXAM: ABDOMEN - 1 VIEW COMPARISON:  CT abdomen pelvis-12/29/2019 FINDINGS: Enteric contrast is seen throughout the colon. No evidence of enteric obstruction. Enteric tube tip overlies expected location the gastric antrum. Nondiagnostic evaluation for pneumoperitoneum secondary to supine positioning and exclusion of the lower thorax. No pneumatosis or portal venous gas. Multiple calcified fibroids overlie the pelvis as demonstrated on previous abdominal CT. No acute osseous abnormalities. IMPRESSION: Enteric contrast seen throughout the colon. No evidence of enteric obstruction. Electronically Signed   By: Sandi Mariscal M.D.   On: 01/03/2020 09:33   CT HEAD WO CONTRAST  Addendum Date: 12/15/2019   ADDENDUM REPORT: 12/15/2019 16:10 ADDENDUM: These results were called by telephone at the time of interpretation on 12/15/2019 at 4:10 pm to provider Lauraine Rinne , who verbally acknowledged these results. Electronically Signed   By: Constance Holster M.D.   On: 12/15/2019 16:10   Result Date: 12/15/2019 CLINICAL DATA:  Stroke follow-up. EXAM: CT HEAD WITHOUT CONTRAST TECHNIQUE: Contiguous axial images were obtained from the base of the skull through the vertex without intravenous contrast. COMPARISON:  12/08/2019. FINDINGS: Brain: No evidence of acute infarction, hemorrhage, hydrocephalus, extra-axial collection or mass lesion/mass effect. Atrophy and chronic  microvascular ischemic changes are noted. There is a new hypoattenuating area in the left pons. Vascular: No hyperdense vessel or unexpected calcification. Skull: Normal. Negative for fracture or focal lesion. Sinuses/Orbits: No acute finding. Other: None. IMPRESSION: 1. New hypoattenuating area within the left pons is concerning for a developing infarct. Artifact in this region can have a very similar appearance. Further evaluation with MRI is recommended. 2. No acute intracranial hemorrhage. No mass effect or midline shift. Chronic findings as detailed above. Electronically Signed: By: Constance Holster M.D. On: 12/15/2019 15:59   IR GASTROSTOMY TUBE MOD SED  Result Date: 01/03/2020 INDICATION: Dysphagia. Please perform percutaneous gastrostomy tube placement for enteric nutrition supplementation purposes. EXAM: PULL TROUGH GASTROSTOMY TUBE PLACEMENT COMPARISON:  CT abdomen pelvis-12/28/2019; abdominal radiograph-earlier same day MEDICATIONS: Ancef 2 gm IV; Antibiotics were administered within 1 hour of the procedure. Glucagon 1 mg IV CONTRAST:  10 mL of Omnipaque 300 administered into the gastric lumen. ANESTHESIA/SEDATION: Moderate (conscious) sedation was employed during this procedure. A total of Versed 0.5 mg and Fentanyl 25 mcg was administered intravenously. Moderate Sedation Time: 10 minutes. The patient's level of consciousness and vital signs were monitored continuously by radiology nursing throughout the procedure under my direct supervision. FLUOROSCOPY TIME:  3 minutes, 42 seconds (25.0 mGy) COMPLICATIONS: None immediate. PROCEDURE: Informed written consent was obtained from the patient following explanation of the procedure, risks, benefits and alternatives. A time out was performed prior to the initiation of the procedure. Ultrasound scanning was performed to demarcate the edge of the left lobe of the liver. Maximal barrier sterile technique utilized including caps, mask, sterile gowns, sterile  gloves, large sterile drape, hand hygiene and Betadine prep. The left upper quadrant was sterilely prepped and draped. An oral gastric catheter was inserted into the stomach under fluoroscopy. The existing nasogastric feeding tube was removed. The left costal margin and barium/air opacified transverse colon were identified and avoided. Air was injected into the stomach for insufflation and visualization under fluoroscopy. Under sterile conditions a 17 gauge  trocar needle was utilized to access the stomach percutaneously beneath the left subcostal margin after the overlying soft tissues were anesthetized with 1% Lidocaine with epinephrine. Needle position was confirmed within the stomach with aspiration of air and injection of small amount of contrast. A single T tack was deployed for gastropexy. Over an Amplatz guide wire, a 9-French sheath was inserted into the stomach. A snare device was utilized to capture the oral gastric catheter. The snare device was pulled retrograde from the stomach up the esophagus and out the oropharynx. The 20-French pull-through gastrostomy was connected to the snare device and pulled antegrade through the oropharynx down the esophagus into the stomach and then through the percutaneous tract external to the patient. The gastrostomy was assembled externally. Contrast injection confirms position in the stomach. Several spot radiographic images were obtained in various obliquities for documentation. The patient tolerated procedure well without immediate post procedural complication. FINDINGS: After successful fluoroscopic guided placement, the gastrostomy tube is appropriately positioned with internal disc against the ventral aspect of the gastric lumen. IMPRESSION: Successful fluoroscopic insertion of a 20-French pull-through gastrostomy tube. The gastrostomy may be used immediately for medication administration and in 24 hrs for the initiation of feeds. Electronically Signed   By: Sandi Mariscal M.D.   On: 01/03/2020 11:34   DG Chest Port 1 View  Result Date: 12/11/2019 CLINICAL DATA:  86 year old female with history of trauma from a fall. EXAM: PORTABLE CHEST 1 VIEW COMPARISON:  Chest x-ray 12/07/2019. FINDINGS: Lung volumes are low. No consolidative airspace disease. No pleural effusions. No pneumothorax. No pulmonary nodule or mass noted. Pulmonary vasculature and the cardiomediastinal silhouette are within normal limits. Aortic atherosclerosis. Status post median sternotomy for CABG. Left-sided pacemaker device in place with lead tips projecting over the expected location of the right atrium and right ventricle. IMPRESSION: 1. Low lung volumes without radiographic evidence of acute cardiopulmonary disease. 2. Aortic atherosclerosis. 3. Postoperative changes and support apparatus, as above. Electronically Signed   By: Vinnie Langton M.D.   On: 12/11/2019 17:22   DG Abd Portable 1V  Result Date: 12/19/2019 CLINICAL DATA:  86 year old female status post NG placement. EXAM: PORTABLE ABDOMEN - 1 VIEW COMPARISON:  Earlier radiograph dated 12/19/2019. FINDINGS: Partially visualized enteric tube with side-port in the proximal stomach and tip in the region of the gastric fundus. No additional interval change. IMPRESSION: Enteric tube with tip in the gastric fundus. Electronically Signed   By: Anner Crete M.D.   On: 12/19/2019 16:09   DG Abd Portable 1V  Result Date: 12/19/2019 CLINICAL DATA:  NG tube placement. EXAM: PORTABLE ABDOMEN - 1 VIEW COMPARISON:  12/13/2019 FINDINGS: There is no visible NG tube in the lower chest or in the abdomen.Bowel gas pattern is normal. Lung bases are clear. No acute bone abnormality. IMPRESSION: No visible NG tube in the lower chest or abdomen. Electronically Signed   By: Lorriane Shire M.D.   On: 12/19/2019 13:43   DG Abd Portable 1V  Result Date: 12/13/2019 CLINICAL DATA:  Nausea, vomiting. EXAM: PORTABLE ABDOMEN - 1 VIEW COMPARISON:  None. FINDINGS:  The bowel gas pattern is normal. Probable multiple calcified uterine fibroids are noted. IMPRESSION: No evidence of bowel obstruction or ileus. Electronically Signed   By: Marijo Conception M.D.   On: 12/13/2019 14:55   DG Swallowing Func-Speech Pathology  Result Date: 12/19/2019 Objective Swallowing Evaluation: Type of Study: MBS-Modified Barium Swallow Study  Patient Details Name: [name] MRN: [id number] Date of Birth:  1928/04/27 Today's Date: 12/19/2019 Time: SLP Start Time (ACUTE ONLY): 0901 -SLP Stop Time (ACUTE ONLY): 0920 SLP Time Calculation (min) ): 19 min Past Medical History: Past Medical History: Diagnosis Date . Anemia  . Arthritis  . Celiac disease  . Chronic systolic CHF (congestive heart failure) ([place])  . Complete heart block ([place])  . Coronary artery disease  . Diabetes mellitus   INSULIN DEPENDENT . GERD (gastroesophageal reflux disease)  . Glaucoma  . Headache(784.0)  . Heart disease  . Hypercholesterolemia  . Hypertension  . Iron deficiency anemia, unspecified  . Ischemic cardiomyopathy  . Memory difficulties 04/14/2019 . Shortness of breath  . Sleep apnea   uses cpap . Stroke ([place])  . Unspecified constipation  Past Surgical History: Past Surgical History: Procedure Laterality Date . BACK SURGERY   . BI-VENTRICULAR PACEMAKER INSERTION (CRT-P)  March 2014  [facility] . BIV PACEMAKER GENERATOR CHANGEOUT N/A 06/29/2019  Procedure: BIV PACEMAKER GENERATOR CHANGEOUT;  Surgeon: Evans Lance, MD;  Location: [place] CV LAB;  Service: Cardiovascular;  Laterality: N/A; . CARDIAC CATHETERIZATION  09/02/2012 . CARDIAC SURGERY   . CORONARY ANGIOPLASTY WITH STENT PLACEMENT  09/02/2012  RCA . PERCUTANEOUS CORONARY STENT INTERVENTION (PCI-S) N/A 09/02/2012  Procedure: PERCUTANEOUS CORONARY STENT INTERVENTION (PCI-S);  Surgeon: Clent Demark, MD;  Location: [facility] CATH LAB;  Service: Cardiovascular;  Laterality: N/A; . PERMANENT PACEMAKER INSERTION N/A 11/29/2012  Procedure: PERMANENT PACEMAKER  INSERTION;  Surgeon: Deboraha Sprang, MD;  Location[facility] CATH LAB;  Service: Cardiovascular;  Laterality: N/A; . TEMPORARY PACEMAKER INSERTION N/A 11/28/2012  Procedure: TEMPORARY PACEMAKER INSERTION;  Surgeon: Clent Demark, MD;  Location: [place] CATH LAB;  Service: Cardiovascular;  Laterality: N/A; HPI: Pt is a 86 y.o. F with significant PMH of CHF, DM, CAD with pacemaker, HTN, prior stroke, who presents with several falls in the prior 24 hours and RLE weakness. CT negative for acute abnormality. Pt unable to receive MRI due to pacemaker. [facility] 12[facility]14/19.  Assessment / Plan / Recommendation CHL IP CLINICAL IMPRESSIONS 12/19/2019 Clinical Impression Pt presents with severe oropharyngeal dysphagia and is unsafe for PO intake at this time. Pt was awake and focused attention was, but she was only able to trigger volitional swallow on command X1 during barium trials across various textures ranging from thin to puree. Pt exhibited severe oral holding of small puree, nectar, and thin boluses. When bolus size increased by clinician in attempt to increase sensory input, nectar barium spilled anteriorly out of oral cavity as well as prematurely into pharynx with very little intentional oral manipulation of bolus by pt and she was unable to follow commands to trigger volitional swallow. Only 1 reflexive swallow of thin barium was captured on imaging due to pt's decreased ability to follow commands during PO presentations, during which no aspiration or penetration was observed. However, during subsequent imaging, a trace amount of barium was visualized in laryngeal vestibule, which SLP suspects entered airway after the swallow, as a result of either lingual/palatal or vallecular residue. Suctioning was required during 100% PO presentations throughout study today. Given severity of pt's oral phase deficits and reduced ability to follow commands, PO intake is inefficient and unsafe at this time. Recommend pt continue NPO and  medical team may wish to pursue alternative means of nutrition and medication administration. ST will continue to provide skilled interventions to work toward readiness for repeat MBSS to assess potential for diet advancement. SLP Visit Diagnosis Dysphagia, oropharyngeal phase (R13.12) Attention and concentration deficit  following -- Frontal lobe and executive function deficit following -- Impact on safety and function Severe aspiration risk   CHL IP TREATMENT RECOMMENDATION 12/08/2019 Treatment Recommendations Therapy as outlined in treatment plan below   Prognosis 12/08/2019 Prognosis for Safe Diet Advancement Fair Barriers to Reach Goals Cognitive deficits Barriers/Prognosis Comment -- CHL IP DIET RECOMMENDATION 12/19/2019 SLP Diet Recommendations NPO;Alternative means - temporary;Alternative means - long-term Liquid Administration via -- Medication Administration Via alternative means Compensations -- Postural Changes --   CHL IP OTHER RECOMMENDATIONS 12/19/2019 Recommended Consults -- Oral Care Recommendations Oral care QID Other Recommendations --   CHL IP FOLLOW UP RECOMMENDATIONS 12/13/2019 Follow up Recommendations 24 hour supervision/assistance;Skilled Nursing facility;Inpatient Rehab   CHL IP FREQUENCY AND DURATION 12/08/2019 Speech Therapy Frequency (ACUTE ONLY) min 2x/week Treatment Duration 2 weeks      CHL IP ORAL PHASE 12/19/2019 Oral Phase Impaired Oral - Pudding Teaspoon -- Oral - Pudding Cup -- Oral - Honey Teaspoon -- Oral - Honey Cup -- Oral - Nectar Teaspoon -- Oral - Nectar Cup Left anterior bolus loss;Right anterior bolus loss;Weak lingual manipulation;Lingual pumping;Incomplete tongue to palate contact;Reduced posterior propulsion;Holding of bolus;Delayed oral transit;Decreased bolus cohesion;Premature spillage;Lingual/palatal residue Oral - Nectar Straw -- Oral - Thin Teaspoon -- Oral - Thin Cup Left anterior bolus loss;Right anterior bolus loss;Weak lingual manipulation;Reduced posterior  propulsion;Decreased bolus cohesion;Holding of bolus;Delayed oral transit;Lingual/palatal residue Oral - Thin Straw NT Oral - Puree Weak lingual manipulation;Decreased bolus cohesion;Delayed oral transit;Reduced posterior propulsion;Holding of bolus;Lingual pumping Oral - Mech Soft -- Oral - Regular -- Oral - Multi-Consistency -- Oral - Pill -- Oral Phase - Comment --  CHL IP PHARYNGEAL PHASE 12/19/2019 Pharyngeal Phase Impaired Pharyngeal- Pudding Teaspoon -- Pharyngeal -- Pharyngeal- Pudding Cup -- Pharyngeal -- Pharyngeal- Honey Teaspoon -- Pharyngeal -- Pharyngeal- Honey Cup -- Pharyngeal -- Pharyngeal- Nectar Teaspoon -- Pharyngeal -- Pharyngeal- Nectar Cup Penetration/Apiration after swallow Pharyngeal Material enters airway, CONTACTS cords and not ejected out Pharyngeal- Nectar Straw -- Pharyngeal -- Pharyngeal- Thin Teaspoon -- Pharyngeal -- Pharyngeal- Thin Cup Reduced anterior laryngeal mobility;Reduced laryngeal elevation;Delayed swallow initiation-pyriform sinuses Pharyngeal -- Pharyngeal- Thin Straw -- Pharyngeal -- Pharyngeal- Puree -- Pharyngeal -- Pharyngeal- Mechanical Soft -- Pharyngeal -- Pharyngeal- Regular -- Pharyngeal -- Pharyngeal- Multi-consistency -- Pharyngeal -- Pharyngeal- Pill -- Pharyngeal -- Pharyngeal Comment --  CHL IP CERVICAL ESOPHAGEAL PHASE 12/19/2019 Cervical Esophageal Phase WFL Pudding Teaspoon -- Pudding Cup -- Honey Teaspoon -- Honey Cup -- Nectar Teaspoon -- Nectar Cup -- Nectar Straw -- Thin Teaspoon -- Thin Cup -- Thin Straw -- Puree -- Mechanical Soft -- Regular -- Multi-consistency -- Pill -- Cervical Esophageal Comment -- Arbutus Leas 12/19/2019, 9:56 AM               Labs:  Basic Metabolic Panel: Recent Labs  Lab 01/09/20 0600  NA 141  K 4.5  CL 104  CO2 27  GLUCOSE 97  BUN 21  CREATININE 0.75  CALCIUM 9.9    CBC: Recent Labs  Lab 01/09/20 0600  WBC 8.1  NEUTROABS 4.7  HGB 9.1*  HCT 29.7*  MCV 95.2  PLT 246    CBG: Recent Labs  Lab  01/08/20 2029 01/09/20 0623 01/09/20 1131 01/09/20 1652 01/09/20 2115  GLUCAP 200* 120* 210* 206* 109*   Family history.  Mother with diabetes hypertension and hyperlipidemia.  Sister with dementia.  Denies any colon cancer rectal cancer esophageal cancer  Brief HPI:   [name] is a 86 y.o. right-handed female with history of  CAD with stenting, CKD stage III with creatinine 1.56, CVA maintained on Plavix, memory loss maintained on Aricept followed by Dr. Jannifer Franklin, heart block with pacemaker, hypertension, type 2 diabetes mellitus, chronic diastolic congestive heart failure.  Patient lives with family 1 level home.  Used a cane prior to admission.  Presented 12/07/2019 after a fall without loss of consciousness.  She was noted to have slurred speech and  weakness.  Cranial CT scan unremarkable for acute intracranial process.  Patient did not receive TPA.  MRI not completed due to pacemaker.  Echocardiogram with ejection fraction of 55% without emboli.  Neurology follow-up maintained on aspirin and Plavix for CVA prophylaxis subcutaneous Lovenox for DVT prophylaxis.  Admission chemistries with creatinine 1.62, SARS coronavirus negative.  Hospital course complicated by postoperative dysphagia maintained on a dysphagia #2 thin liquid diet.  Urinalysis study 12/12/2019 greater than 100,000 Citrobacter placed on Merrem 12/13/2019 for UTI.  Therapy evaluations completed and patient was admitted for a comprehensive rehab program.   Hospital Course: [name] was admitted to rehab 12/14/2019 for inpatient therapies to consist of PT, ST and OT at least three hours five days a week. Past admission physiatrist, therapy team and rehab RN have worked together to provide customized collaborative inpatient rehab.  Pertaining to patient CVA hypoattenuation left pons consistent with current presentation of weakness with dysarthria and dysphagia.  She remained on aspirin and Plavix therapy followed by neurology  services.  Subcutaneous Lovenox for DVT prophylaxis.  Blood pressure controlled with Coreg as well as low-dose Norvasc.  Mood stabilization maintained on Aricept as prior to admission.  Diabetes mellitus with peripheral neuropathy hemoglobin A1c 8.2 insulin therapy as adjusted adjusted accordingly as monitoring of blood sugars.  History of CAD with stenting as well as pacemaker no MRI was completed due to pacemaker.  She remained on aspirin and Plavix therapy.  CKD stage III latest creatinine 0.75.  Dysphagia related to CVA her diet was downgraded followed closely by speech therapy patient at risk for aspiration with placement of gastrostomy tube by interventional radiology 01/03/2020.  Noted during work-up of placement of PEG tube there was some rectal wall swelling identified with gastroenterology consulted undergoing flexible sigmoidoscopy 12/30/2019 per Dr. Benson Norway showing a single ulcer in the rectum no other bleeding identified advised continue aspirin and Plavix therapy.  Patient with history of chronic diastolic congestive heart failure she exhibited no other signs of fluid overload weight overall remained stable.  Lipitor ongoing for hyperlipidemia.  Pain managed with use of Tylenol as well as the addition of a Lidoderm patch with good results.  Patient with slow progressive gains issues discussed with family on need for physical assistance felt skilled nursing facility was needed with bed becoming available 01/10/2020.   Blood pressures were monitored on TID basis and soft and monitored  Diabetes has been monitored with ac/hs CBG checks and SSI was use prn for tighter BS control.   She has made gains during rehab stay and is attending therapies  She will continue to receive follow up therapies   after discharge  Rehab course: During patient's stay in rehab weekly team conferences were held to monitor patient's progress, set goals and discuss barriers to discharge. At admission, patient required total  assist sit to stand, total assist stand pivot transfers.  Moderate assist side-lying to sitting.  Moderate assist supine to sit.  Minimal assist upper body bathing total assist lower body bathing minimal assist upper body dressing +2 physical assist toilet transfers  Physical exam.  Blood pressure 132/64 pulse 72 temperature 97 respirations 18 oxygen saturation 98% room air Constitutional.  Frail elderly female in no acute distress HEENT Head.  Normocephalic and atraumatic Eyes.  Right eye exhibits no discharge left eye exhibits no discharge question left gaze preference Neck.  Supple nontender no JVD without thyromegaly Cardiac regular rate rhythm without any extra sounds or murmur heard Abdomen.  Soft nontender positive bowel sounds Neurological.  Patient lethargic but arousable she would answer some simple yes/no questions oriented to person.  Right facial weakness. Motor.  Right upper right lower extremity 0 out of 5 proximal distal no increase in tone appreciated Left upper left lower extremity appears to be 5 out of 5 proximal to distal sensation intact to light touch.  She was dysarthric with a component of aphasia  She  has had improvement in activity tolerance, balance, postural control as well as ability to compensate for deficits. She has had improvement in functional use RUE/LUE  and RLE/LLE as well as improvement in awareness.  Patient tolerated standing approximately 8 minutes with patient performing reaching tasks with left upper extremity.  Patient was able to reach both the left and right with left upper extremity maintain upright posture.  Transfer to bed and perform sit to supine with max assist.  Performed rolling left to right max assist with second person assistance.  Max assist upper body bathing dependent lower body bathing max assist upper body dressing dependent lower body dressing       Disposition: Discharge to skilled nursing facility   Diet:NPO  Jevity tube  feeds 237 mL 5 times a day by PEG tube with free water 200 mL 3 times daily  Special Instructions: Routine gastrostomy tube care  Medications at discharge 1.  Tylenol 650 mg every 4 hours as needed per tube 2.  Norvasc 5 mg daily by tube 3.  Aspirin 81 mg daily by tube 4.  Lipitor 80 mg daily by tube 5.  Alphagan ophthalmic solution 0.2% 1 drop both eyes twice daily 6.  Coreg 3.125 mg twice daily by tube 7.  Plavix 75 mg daily by tube 8.  Aricept 10 mg daily by tube 9.  Trusopt ophthalmic solution 2% 1 drop both eyes 3 times daily 10.  Ferrous sulfate 300 mg daily by tube 11.  Lantus insulin 19 units twice daily 12.Xalatan ophthalmic solution 0.05% 1 drop both eyes bedtime 13.  Lidoderm patch change as directed 14.  Protonix 40 mg daily by tube 15.  MiraLAX twice daily by tube hold for loose stools 16.  Scopolamine patch 1.5 mg every 72 hours 17.  Senokot S1 tablet twice daily by tube 18.  Tramadol 50 mg every 12 hours as needed pain by tube    Discharge Instructions    Ambulatory referral to Neurology   Complete by: As directed    An appointment is requested in approximately 4 weeks left brain infarction   Ambulatory referral to Physical Medicine Rehab   Complete by: As directed    Follow-up 1 month left brainstem infarction.  Patient for skilled nursing facility placement       Contact information for follow-up providers    Kirsteins, Luanna Salk, MD Follow up.   Specialty: Physical Medicine and Rehabilitation Why: office to call for appointment Contact information: [place] [address] [phone]        Sandi Mariscal, MD Follow up.   Specialties: Interventional Radiology, Radiology Why: call for appointment  as needed Contact information: [address] [place] [address] [phone]            Contact information for after-discharge care    Destination    [facility] Preferred SNF .   Service:  Skilled Nursing Contact information: [address] [place] [phone]                  Signed: Cathlyn Parsons 01/10/2020, 5:17 AM

## 2020-01-10 NOTE — Plan of Care (Signed)
  Problem: Consults Goal: RH STROKE PATIENT EDUCATION Description: See Patient Education module for education specifics  Outcome: Progressing Goal: Nutrition Consult-if indicated Outcome: Progressing Goal: Diabetes Guidelines if Diabetic/Glucose > 140 Description: If diabetic or lab glucose is > 140 mg/dl - Initiate Diabetes/Hyperglycemia Guidelines & Document Interventions  Outcome: Progressing   Problem: RH BOWEL ELIMINATION Goal: RH STG MANAGE BOWEL WITH ASSISTANCE Description: STG Manage Bowel with Moderate Assistance. Outcome: Progressing   Problem: RH BLADDER ELIMINATION Goal: RH STG MANAGE BLADDER WITH ASSISTANCE Description: STG Manage Bladder With Moderate Assistance Outcome: Progressing   Problem: RH SKIN INTEGRITY Goal: RH STG SKIN FREE OF INFECTION/BREAKDOWN Description: Patient will verbalize how to prevent skin breakdown during hospitalization. Outcome: Progressing Goal: RH STG MAINTAIN SKIN INTEGRITY WITH ASSISTANCE Description: STG Maintain Skin Integrity With Moderate Assistance. Outcome: Progressing   Problem: RH SAFETY Goal: RH STG ADHERE TO SAFETY PRECAUTIONS W/ASSISTANCE/DEVICE Description: STG Adhere to Safety Precautions With Moderate Assistance/Device. Outcome: Progressing   Problem: RH COGNITION-NURSING Goal: RH STG USES MEMORY AIDS/STRATEGIES W/ASSIST TO PROBLEM SOLVE Description: STG Uses Memory Aids/Strategies With Moderate Assistance to Problem Solve. Outcome: Progressing   Problem: RH PAIN MANAGEMENT Goal: RH STG PAIN MANAGED AT OR BELOW PT'S PAIN GOAL Description: <5 Outcome: Progressing   Problem: RH KNOWLEDGE DEFICIT Goal: RH STG INCREASE KNOWLEDGE OF DIABETES Outcome: Progressing Goal: RH STG INCREASE KNOWLEDGE OF HYPERTENSION Outcome: Progressing Goal: RH STG INCREASE KNOWLEDGE OF DYSPHAGIA/FLUID INTAKE Outcome: Progressing

## 2020-01-10 NOTE — Progress Notes (Signed)
Patient discharge to [facility] in [place]. Report given to [facility] no further questions at time. Adria Devon, LPN

## 2020-01-10 NOTE — Plan of Care (Signed)
  Problem: Consults Goal: RH STROKE PATIENT EDUCATION Description: See Patient Education module for education specifics  Outcome: Completed/Met Goal: Nutrition Consult-if indicated Outcome: Completed/Met Goal: Diabetes Guidelines if Diabetic/Glucose > 140 Description: If diabetic or lab glucose is > 140 mg/dl - Initiate Diabetes/Hyperglycemia Guidelines & Document Interventions  Outcome: Completed/Met   Problem: RH BOWEL ELIMINATION Goal: RH STG MANAGE BOWEL WITH ASSISTANCE Description: STG Manage Bowel with Moderate Assistance. Outcome: Completed/Met   Problem: RH BLADDER ELIMINATION Goal: RH STG MANAGE BLADDER WITH ASSISTANCE Description: STG Manage Bladder With Moderate Assistance Outcome: Completed/Met   Problem: RH SKIN INTEGRITY Goal: RH STG SKIN FREE OF INFECTION/BREAKDOWN Description: Patient will verbalize how to prevent skin breakdown during hospitalization. Outcome: Completed/Met Goal: RH STG MAINTAIN SKIN INTEGRITY WITH ASSISTANCE Description: STG Maintain Skin Integrity With Moderate Assistance. Outcome: Completed/Met   Problem: RH SAFETY Goal: RH STG ADHERE TO SAFETY PRECAUTIONS W/ASSISTANCE/DEVICE Description: STG Adhere to Safety Precautions With Moderate Assistance/Device. Outcome: Completed/Met   Problem: RH COGNITION-NURSING Goal: RH STG USES MEMORY AIDS/STRATEGIES W/ASSIST TO PROBLEM SOLVE Description: STG Uses Memory Aids/Strategies With Moderate Assistance to Problem Solve. Outcome: Completed/Met   Problem: RH PAIN MANAGEMENT Goal: RH STG PAIN MANAGED AT OR BELOW PT'S PAIN GOAL Description: <5 Outcome: Completed/Met   Problem: RH KNOWLEDGE DEFICIT Goal: RH STG INCREASE KNOWLEDGE OF DIABETES Outcome: Completed/Met Goal: RH STG INCREASE KNOWLEDGE OF HYPERTENSION Outcome: Completed/Met Goal: RH STG INCREASE KNOWLEDGE OF DYSPHAGIA/FLUID INTAKE Outcome: Completed/Met

## 2020-01-10 NOTE — Progress Notes (Signed)
[facility] PHYSICAL MEDICINE & REHABILITATION PROGRESS NOTE   Subjective/Complaints:  No issues overnite , per RN still cathing q 8h, tolerating TF  ROS: limited by cognition     Objective:   No results found. Recent Labs    01/09/20 0600  WBC 8.1  HGB 9.1*  HCT 29.7*  PLT 246   Recent Labs    01/09/20 0600  NA 141  K 4.5  CL 104  CO2 27  GLUCOSE 97  BUN 21  CREATININE 0.75  CALCIUM 9.9    Intake/Output Summary (Last 24 hours) at 01/10/2020 0912 Last data filed at 01/10/2020 0745 Gross per 24 hour  Intake --  Output 2550 ml  Net -2550 ml     Physical Exam: Vital Signs Blood pressure (!) 160/91, pulse 79, temperature 97.6 F (36.4 C), temperature source Oral, resp. rate 19, height 5' 7"  (1.702 m), weight 82.9 kg, SpO2 90 %. General: No acute distress. Appears to be in pain during transfer from bed to chair.  Mood and affect are appropriate Heart: Regular rate and rhythm no rubs murmurs or extra sounds Lungs: Clear to auscultation, breathing unlabored, no rales or wheezes Abdomen: Positive bowel sounds, soft nontender to palpation, nondistended Extremities: No clubbing, cyanosis, or edema Skin: No evidence of breakdown, no evidence of rash G tube site CDI  Neurologic: Cranial nerves II through XII intact, motor strength is 4/5 in RIght and trace Left l deltoid, bicep, tricep, grip, hip flexor, knee extensors, ankle dorsiflexor and plantar flexor + left neglect   Assessment/Plan: 1. Functional deficits secondary to Left brainstem vs subcortical infarct  Stable for D/C today F/u PCP in 3-4 weeks F/u PM&R 2 weeks See D/C summary See D/C instructions Care Tool:  Bathing  Bathing activity did not occur: Safety/medical concerns Body parts bathed by patient: Face   Body parts bathed by helper: Right arm, Left arm, Chest, Abdomen, Front perineal area, Buttocks, Right upper leg, Left upper leg, Right lower leg, Left lower leg     Bathing assist Assist Level:  2 Helpers(per most recent staff report)     Upper Body Dressing/Undressing Upper body dressing   What is the patient wearing?: Pull over shirt    Upper body assist Assist Level: Maximal Assistance - Patient 25 - 49%    Lower Body Dressing/Undressing Lower body dressing      What is the patient wearing?: Incontinence brief     Lower body assist Assist for lower body dressing: 2 Helpers     Toileting Toileting Toileting Activity did not occur (Clothing management and hygiene only): N/A (no void or bm)  Toileting assist Assist for toileting: Dependent - Patient 0%(per most recent staff report)     Transfers Chair/bed transfer  Transfers assist  Chair/bed transfer activity did not occur: Safety/medical concerns  Chair/bed transfer assist level: 2 Helpers     Locomotion Ambulation   Ambulation assist   Ambulation activity did not occur: Safety/medical concerns          Walk 10 feet activity   Assist  Walk 10 feet activity did not occur: Safety/medical concerns        Walk 50 feet activity   Assist Walk 50 feet with 2 turns activity did not occur: Safety/medical concerns         Walk 150 feet activity   Assist Walk 150 feet activity did not occur: Safety/medical concerns         Walk 10 feet on uneven surface  activity  Assist Walk 10 feet on uneven surfaces activity did not occur: Safety/medical concerns         Wheelchair     Assist Will patient use wheelchair at discharge?: Yes Type of Wheelchair: Manual Wheelchair activity did not occur: Safety/medical concerns         Wheelchair 50 feet with 2 turns activity    Assist    Wheelchair 50 feet with 2 turns activity did not occur: Safety/medical concerns       Wheelchair 150 feet activity     Assist  Wheelchair 150 feet activity did not occur: Safety/medical concerns       Blood pressure (!) 160/91, pulse 79, temperature 97.6 F (36.4 C), temperature  source Oral, resp. rate 19, height 5' 7"  (1.702 m), weight 82.9 kg, SpO2 90 %.    Medical Problem List and Plan: 1.  Rightt side hemiplegia, dysarthria, +/- aphasia secondary to left brain infarction.  MRI not completed due to pacemaker              Plan is for SNF today  Tolerating TF 2.  Antithrombotics: -DVT/anticoagulation: Subcutaneous Lovenox             -antiplatelet therapy: Continue Aspirin 81 mg daily, Plavix 75 mg daily 3. Pain Management: ContinueTylenol as needed. Added lidocaine patch for hip pain. 4. Mood with memory deficits.  Aricept 10 mg nightly             -antipsychotic agents: N/A 5. Neuropsych: This patient is NOT capable of making decisions on her own behalf. 6. Skin/Wound Care: Routine skin checks 7. Fluids/Electrolytes/Nutrition: Routine in and outs. Now tolerating TF D/C IVF8.  Hypertension.  Norvasc 2.5 mg daily, Coreg 3.125 mg twice daily.               Monitor with increased mobility Vitals:   01/09/20 2300 01/10/20 0433  BP:  (!) 160/91  Pulse:  79  Resp:  19  Temp:  97.6 F (36.4 C)  SpO2: 423% 53%  systolic with mild/mod  elevation  Increase amlodipine just increased 3/22  3/22: BP elevated mildly. Continue to monitor.  9.  Diabetes mellitus with hyperglycemia.  Hemoglobin A1c 8.2.  Lantus insulin 10 units twice daily,               CBG (last 3)  Recent Labs    01/09/20 1652 01/09/20 2115 01/10/20 0630  GLUCAP 206* 109* 77  Have adjusted diabetic management now just receiving Lantus will change CBG to coincide with bolus feeds   3/22: labile: continue to monitor.  10.  CAD with stenting as well as pacemaker.  Continue aspirin and Plavix. 11.  CKD stage IIIb.  Creatinine baseline 1.56.             Normalizing back to baseline  3/13- labs monday  12.  Chronic diastolic congestive heart failure.  Monitor for any signs of fluid overload             Daily weights   Filed Weights   01/07/20 0412 01/09/20 0550 01/10/20 0433  Weight: 85.9 kg  85.6 kg 82.9 kg   3/14- weight overall stable, however no weight today- will ask nursing.  13.  OSA.  CPAP. 14.  Hyperlipidemia.  Lipitor 15.  Chronic normocytic anemia.  Continue iron supplement             hgb 10 16.  Post stroke dysphagia: Dysphagia #2 thin liquids.  3/13- was on D2 thin lqiuids,  but appears is now NPO- getting TFs by [facility]- con't regimen and monitor- will need open to get PEG    Follow-up speech therapy.  Advance as tolerated  3/2: see diet/nutrition.  17.  UTI/Citrobacter/EColi:  Resolved    19. Hypercalcemia- ? Etiology, nephro following 20.  Severe Dysphagia, NPO-    POD #5 post PEG- wound without drainage or bleeding     21. R heel DTI  3/13- might have been blister initially but now DTI- pic as above- will cover for standing activities and wear PRAFO when not standing otherwise.   22. Sedation- actually has improved. Brainstem and subocrtical infarcts typically do not cause RAS depression but may trial ritalin     23. Urinary retention  Severe stroke plus immobility +/- diabetic cystopathy  , D/C flomax no effect D/C  Urecholine no improvement with escalating doses   24.  Stercoral ulcer avoid constipation  25. Drooling: Added Scopolamine patch.  LOS: 27 days A FACE TO FACE EVALUATION WAS PERFORMED  [name] 01/10/2020, 9:12 AM

## 2020-01-10 NOTE — Progress Notes (Signed)
Speech Language Pathology Discharge Summary  Patient Details  Name: [name] MRN: [id number] Date of Birth: 20-Oct-1928     Patient has met 4 of 7 long term goals.  Patient to discharge at [facility] Max;Mod level.  Reasons goals not met: Severe cognitive impairments required Max A for problem solving, emergent awareness, and recall   Clinical Impression/Discharge Summary:   Pt had a difficult hospital course, with dysphagia, dysarthria, and cognitive impairments increasing in severity due to medical decline as well as fluctuating lethargy limiting her participation at times. As a result, she met 4 out of 7 long term goals this admission. Pt currently requires Mod-Max assist for basic self care tasks, participation in PO trials and pharyngeal strengthening exercises, and use of increased vocal intensity to increase speech intelligibility. Although attempted, pt unable to utilize other cues for intelligibility such as articulator placement cues, due to degree of cognitive impairments. Pt sustains attention to tasks with Mod A, however Max a required for problem solving, awareness of errors, and recall of short and long term information. Pt will require 24/7 supervision and hands on care, and is d/c to SNF. Pt is NPO with a PEG and will need intensive dysphagia therapy to work toward potential advancement and rehabilitation or oropharyngeal swallow function. However, given severe dysphagia, dysarthria, and cognitive deficits still present, recommend pt continue to receive skilled ST services upon discharge to SNF. Pt education is complete at this time, no family has been present for ST in the 2 weeks.  Care Partner:  Caregiver Able to Provide Assistance: No  Type of Caregiver Assistance: (n/a)  Recommendation:  Skilled Nursing facility;24 hour supervision/assistance  Rationale for SLP Follow Up: Maximize functional communication;Maximize swallowing safety;Maximize cognitive function and  independence;Reduce caregiver burden   Equipment: none   Reasons for discharge: Discharged from hospital   Patient/Family Agrees with Progress Made and Goals Achieved: Yes    Arbutus Leas 01/10/2020, 7:18 AM

## 2020-01-10 NOTE — Care Management (Signed)
Patient ID: [name], female   DOB: 10-25-1927, 86 y.o.   MRN: [id number]   Received approval from [facility] for SNF at [facility] 01/09/20 @ 1800.  Approval number [id number] through [phone]. Not a guarantee of coverage however approval for SNF placement and pre-authorization of facility to be discharged to by [facility]. Care Manager with [facility] is [place] @ [phone]

## 2020-01-10 NOTE — Discharge Instructions (Signed)
Inpatient Rehab Discharge Instructions  [name] Discharge date and time: No discharge date for patient encounter.   Activities/Precautions/ Functional Status: Activity: activity as tolerated Diet:  Wound Care: none needed Functional status:  ___ No restrictions     ___ Walk up steps independently ___ 24/7 supervision/assistance   ___ Walk up steps with assistance ___ Intermittent supervision/assistance  ___ Bathe/dress independently ___ Walk with walker     _x__ Bathe/dress with assistance ___ Walk Independently    ___ Shower independently ___ Walk with assistance    ___ Shower with assistance ___ No alcohol     ___ Return to work/school ________   Special Instructions: No driving smoking or alcohol STROKE/TIA DISCHARGE INSTRUCTIONS SMOKING Cigarette smoking nearly doubles your risk of having a stroke & is the single most alterable risk factor  If you smoke or have smoked in the last 12 months, you are advised to quit smoking for your health.  Most of the excess cardiovascular risk related to smoking disappears within a year of stopping.  Ask you doctor about anti-smoking medications  [facility] Quit Line: 1-800-QUIT NOW  Free Smoking Cessation Classes [phone]  CHOLESTEROL Know your levels; limit fat & cholesterol in your diet  Lipid Panel     Component Value Date/Time   CHOL 185 12/07/2019 0209   CHOL 194 03/28/2013 1021   TRIG 74 12/07/2019 0209   HDL 51 12/07/2019 0209   HDL 44 03/28/2013 1021   CHOLHDL 3.6 12/07/2019 0209   VLDL 15 12/07/2019 0209   LDLCALC 119 (H) 12/07/2019 0209   LDLCALC 125 (H) 03/28/2013 1021      Many patients benefit from treatment even if their cholesterol is at goal.  Goal: Total Cholesterol (CHOL) less than 160  Goal:  Triglycerides (TRIG) less than 150  Goal:  HDL greater than 40  Goal:  LDL (LDLCALC) less than 100   BLOOD PRESSURE American Stroke Association blood pressure target is less that 120/80 mm/Hg  Your  discharge blood pressure is:  BP: (!) 157/65  Monitor your blood pressure  Limit your salt and alcohol intake  Many individuals will require more than one medication for high blood pressure  DIABETES (A1c is a blood sugar average for last 3 months) Goal HGBA1c is under 7% (HBGA1c is blood sugar average for last 3 months)  Diabetes:     Lab Results  Component Value Date   HGBA1C 8.2 (H) 12/07/2019     Your HGBA1c can be lowered with medications, healthy diet, and exercise.  Check your blood sugar as directed by your physician  Call your physician if you experience unexplained or low blood sugars.  PHYSICAL ACTIVITY/REHABILITATION Goal is 30 minutes at least 4 days per week  Activity: Increase activity slowly, Therapies: Physical Therapy: Home Health Return to work:   Activity decreases your risk of heart attack and stroke and makes your heart stronger.  It helps control your weight and blood pressure; helps you relax and can improve your mood.  Participate in a regular exercise program.  Talk with your doctor about the best form of exercise for you (dancing, walking, swimming, cycling).  DIET/WEIGHT Goal is to maintain a healthy weight  Your discharge diet is:  Diet Order            DIET DYS 2 Room service appropriate? Yes; Fluid consistency: Thin  Diet effective now              liquids Your height is:  Height:  5' 7"  (170.2 cm) Your current weight is: Weight: 78.7 kg Your Body Mass Index (BMI) is:  BMI (Calculated): 27.17  Following the type of diet specifically designed for you will help prevent another stroke.  Your goal weight range is:    Your goal Body Mass Index (BMI) is 19-24.  Healthy food habits can help reduce 3 risk factors for stroke:  High cholesterol, hypertension, and excess weight.  RESOURCES Stroke/Support Group:  Call [phone]   STROKE EDUCATION PROVIDED/REVIEWED AND GIVEN TO PATIENT Stroke warning signs and symptoms How to activate emergency  medical system (call 911). Medications prescribed at discharge. Need for follow-up after discharge. Personal risk factors for stroke. Pneumonia vaccine given:  Flu vaccine given:  My questions have been answered, the writing is legible, and I understand these instructions.  I will adhere to these goals & educational materials that have been provided to me after my discharge from the hospital.      My questions have been answered and I understand these instructions. I will adhere to these goals and the provided educational materials after my discharge from the hospital.  Patient/Caregiver Signature _______________________________ Date __________  Clinician Signature _______________________________________ Date __________  Please bring this form and your medication list with you to all your follow-up doctor's appointments.

## 2020-01-27 ENCOUNTER — Encounter (HOSPITAL_COMMUNITY): Payer: Self-pay | Admitting: Student

## 2020-01-27 ENCOUNTER — Emergency Department (HOSPITAL_COMMUNITY): Payer: Medicare Other

## 2020-01-27 ENCOUNTER — Inpatient Hospital Stay (HOSPITAL_COMMUNITY)
Admission: EM | Admit: 2020-01-27 | Discharge: 2020-02-03 | DRG: 178 | Disposition: A | Discharge status: Skilled Nursing Facility | Payer: Medicare Other | Source: Skilled Nursing Facility | Attending: Family Medicine | Admitting: Family Medicine

## 2020-01-27 ENCOUNTER — Other Ambulatory Visit: Payer: Self-pay

## 2020-01-27 DIAGNOSIS — E870 Hyperosmolality and hypernatremia: Secondary | ICD-10-CM | POA: Diagnosis present

## 2020-01-27 DIAGNOSIS — I5022 Chronic systolic (congestive) heart failure: Secondary | ICD-10-CM | POA: Diagnosis present

## 2020-01-27 DIAGNOSIS — R7401 Elevation of levels of liver transaminase levels: Secondary | ICD-10-CM

## 2020-01-27 DIAGNOSIS — R609 Edema, unspecified: Secondary | ICD-10-CM | POA: Diagnosis not present

## 2020-01-27 DIAGNOSIS — Z950 Presence of cardiac pacemaker: Secondary | ICD-10-CM

## 2020-01-27 DIAGNOSIS — N390 Urinary tract infection, site not specified: Secondary | ICD-10-CM | POA: Diagnosis present

## 2020-01-27 DIAGNOSIS — K219 Gastro-esophageal reflux disease without esophagitis: Secondary | ICD-10-CM | POA: Diagnosis present

## 2020-01-27 DIAGNOSIS — I130 Hypertensive heart and chronic kidney disease with heart failure and stage 1 through stage 4 chronic kidney disease, or unspecified chronic kidney disease: Secondary | ICD-10-CM | POA: Diagnosis present

## 2020-01-27 DIAGNOSIS — H409 Unspecified glaucoma: Secondary | ICD-10-CM | POA: Diagnosis present

## 2020-01-27 DIAGNOSIS — E1122 Type 2 diabetes mellitus with diabetic chronic kidney disease: Secondary | ICD-10-CM | POA: Diagnosis present

## 2020-01-27 DIAGNOSIS — M7989 Other specified soft tissue disorders: Secondary | ICD-10-CM | POA: Diagnosis not present

## 2020-01-27 DIAGNOSIS — E1165 Type 2 diabetes mellitus with hyperglycemia: Secondary | ICD-10-CM | POA: Diagnosis present

## 2020-01-27 DIAGNOSIS — Z87891 Personal history of nicotine dependence: Secondary | ICD-10-CM

## 2020-01-27 DIAGNOSIS — I69391 Dysphagia following cerebral infarction: Secondary | ICD-10-CM

## 2020-01-27 DIAGNOSIS — E7800 Pure hypercholesterolemia, unspecified: Secondary | ICD-10-CM | POA: Diagnosis present

## 2020-01-27 DIAGNOSIS — Z20822 Contact with and (suspected) exposure to covid-19: Secondary | ICD-10-CM | POA: Diagnosis present

## 2020-01-27 DIAGNOSIS — I69351 Hemiplegia and hemiparesis following cerebral infarction affecting right dominant side: Secondary | ICD-10-CM | POA: Diagnosis not present

## 2020-01-27 DIAGNOSIS — B954 Other streptococcus as the cause of diseases classified elsewhere: Secondary | ICD-10-CM | POA: Diagnosis present

## 2020-01-27 DIAGNOSIS — Z951 Presence of aortocoronary bypass graft: Secondary | ICD-10-CM

## 2020-01-27 DIAGNOSIS — I2510 Atherosclerotic heart disease of native coronary artery without angina pectoris: Secondary | ICD-10-CM | POA: Diagnosis present

## 2020-01-27 DIAGNOSIS — R1310 Dysphagia, unspecified: Secondary | ICD-10-CM | POA: Diagnosis present

## 2020-01-27 DIAGNOSIS — I255 Ischemic cardiomyopathy: Secondary | ICD-10-CM | POA: Diagnosis present

## 2020-01-27 DIAGNOSIS — E119 Type 2 diabetes mellitus without complications: Secondary | ICD-10-CM

## 2020-01-27 DIAGNOSIS — Z833 Family history of diabetes mellitus: Secondary | ICD-10-CM

## 2020-01-27 DIAGNOSIS — Z7982 Long term (current) use of aspirin: Secondary | ICD-10-CM

## 2020-01-27 DIAGNOSIS — R0602 Shortness of breath: Secondary | ICD-10-CM

## 2020-01-27 DIAGNOSIS — E1159 Type 2 diabetes mellitus with other circulatory complications: Secondary | ICD-10-CM | POA: Diagnosis not present

## 2020-01-27 DIAGNOSIS — Z7401 Bed confinement status: Secondary | ICD-10-CM

## 2020-01-27 DIAGNOSIS — Z794 Long term (current) use of insulin: Secondary | ICD-10-CM

## 2020-01-27 DIAGNOSIS — N1832 Chronic kidney disease, stage 3b: Secondary | ICD-10-CM

## 2020-01-27 DIAGNOSIS — Z79899 Other long term (current) drug therapy: Secondary | ICD-10-CM

## 2020-01-27 DIAGNOSIS — Z955 Presence of coronary angioplasty implant and graft: Secondary | ICD-10-CM

## 2020-01-27 DIAGNOSIS — I69320 Aphasia following cerebral infarction: Secondary | ICD-10-CM | POA: Diagnosis not present

## 2020-01-27 DIAGNOSIS — N179 Acute kidney failure, unspecified: Secondary | ICD-10-CM

## 2020-01-27 DIAGNOSIS — I459 Conduction disorder, unspecified: Secondary | ICD-10-CM | POA: Diagnosis not present

## 2020-01-27 DIAGNOSIS — Z993 Dependence on wheelchair: Secondary | ICD-10-CM

## 2020-01-27 DIAGNOSIS — J690 Pneumonitis due to inhalation of food and vomit: Principal | ICD-10-CM

## 2020-01-27 DIAGNOSIS — Z7902 Long term (current) use of antithrombotics/antiplatelets: Secondary | ICD-10-CM

## 2020-01-27 DIAGNOSIS — A419 Sepsis, unspecified organism: Secondary | ICD-10-CM | POA: Diagnosis not present

## 2020-01-27 DIAGNOSIS — I442 Atrioventricular block, complete: Secondary | ICD-10-CM | POA: Diagnosis present

## 2020-01-27 DIAGNOSIS — B9620 Unspecified Escherichia coli [E. coli] as the cause of diseases classified elsewhere: Secondary | ICD-10-CM | POA: Diagnosis present

## 2020-01-27 DIAGNOSIS — Z83438 Family history of other disorder of lipoprotein metabolism and other lipidemia: Secondary | ICD-10-CM

## 2020-01-27 DIAGNOSIS — Z8249 Family history of ischemic heart disease and other diseases of the circulatory system: Secondary | ICD-10-CM

## 2020-01-27 DIAGNOSIS — D631 Anemia in chronic kidney disease: Secondary | ICD-10-CM | POA: Diagnosis present

## 2020-01-27 DIAGNOSIS — M1990 Unspecified osteoarthritis, unspecified site: Secondary | ICD-10-CM | POA: Diagnosis present

## 2020-01-27 DIAGNOSIS — G4730 Sleep apnea, unspecified: Secondary | ICD-10-CM | POA: Diagnosis present

## 2020-01-27 DIAGNOSIS — E860 Dehydration: Secondary | ICD-10-CM | POA: Diagnosis present

## 2020-01-27 DIAGNOSIS — Z931 Gastrostomy status: Secondary | ICD-10-CM

## 2020-01-27 DIAGNOSIS — I10 Essential (primary) hypertension: Secondary | ICD-10-CM | POA: Diagnosis not present

## 2020-01-27 LAB — URINALYSIS, COMPLETE (UACMP) WITH MICROSCOPIC
Bilirubin Urine: NEGATIVE
Glucose, UA: >=500 mg/dL — AB
Hgb urine dipstick: NEGATIVE
Ketones, ur: NEGATIVE mg/dL
Nitrite: NEGATIVE
Protein, ur: 30 mg/dL — AB
Specific Gravity, Urine: 1.014 (ref 1.005–1.030)
WBC, UA: >50 WBC/hpf — ABNORMAL HIGH (ref 0–5)
pH: 5 (ref 5.0–8.0)

## 2020-01-27 LAB — COMPREHENSIVE METABOLIC PANEL
ALT: 78 U/L — ABNORMAL HIGH (ref 0–44)
AST: 62 U/L — ABNORMAL HIGH (ref 15–41)
Albumin: 3.4 g/dL — ABNORMAL LOW (ref 3.5–5.0)
Alkaline Phosphatase: 89 U/L (ref 38–126)
Anion gap: 11 (ref 5–15)
BUN: 43 mg/dL — ABNORMAL HIGH (ref 8–23)
CO2: 29 mmol/L (ref 22–32)
Calcium: 11.1 mg/dL — ABNORMAL HIGH (ref 8.9–10.3)
Chloride: 110 mmol/L (ref 98–111)
Creatinine, Ser: 1.21 mg/dL — ABNORMAL HIGH (ref 0.44–1.00)
GFR calc Af Amer: 46 mL/min — ABNORMAL LOW (ref >60)
GFR calc non Af Amer: 40 mL/min — ABNORMAL LOW (ref >60)
Glucose, Bld: 424 mg/dL — ABNORMAL HIGH (ref 70–99)
Potassium: 4.6 mmol/L (ref 3.5–5.1)
Sodium: 150 mmol/L — ABNORMAL HIGH (ref 135–145)
Total Bilirubin: 0.5 mg/dL (ref 0.3–1.2)
Total Protein: 7.8 g/dL (ref 6.5–8.1)

## 2020-01-27 LAB — CBC WITH DIFFERENTIAL/PLATELET
Abs Immature Granulocytes: 0.05 10*3/uL (ref 0.00–0.07)
Basophils Absolute: 0 10*3/uL (ref 0.0–0.1)
Basophils Relative: 0 %
Eosinophils Absolute: 0 10*3/uL (ref 0.0–0.5)
Eosinophils Relative: 0 %
HCT: 38.7 % (ref 36.0–46.0)
Hemoglobin: 11.5 g/dL — ABNORMAL LOW (ref 12.0–15.0)
Immature Granulocytes: 0 %
Lymphocytes Relative: 14 %
Lymphs Abs: 2.2 10*3/uL (ref 0.7–4.0)
MCH: 28.5 pg (ref 26.0–34.0)
MCHC: 29.7 g/dL — ABNORMAL LOW (ref 30.0–36.0)
MCV: 95.8 fL (ref 80.0–100.0)
Monocytes Absolute: 1.2 10*3/uL — ABNORMAL HIGH (ref 0.1–1.0)
Monocytes Relative: 7 %
Neutro Abs: 12.1 10*3/uL — ABNORMAL HIGH (ref 1.7–7.7)
Neutrophils Relative %: 79 %
Platelets: 214 10*3/uL (ref 150–400)
RBC: 4.04 MIL/uL (ref 3.87–5.11)
RDW: 13.2 % (ref 11.5–15.5)
WBC: 15.6 10*3/uL — ABNORMAL HIGH (ref 4.0–10.5)
nRBC: 0 % (ref 0.0–0.2)

## 2020-01-27 LAB — PROTIME-INR
INR: 1.1 (ref 0.8–1.2)
Prothrombin Time: 13.6 seconds (ref 11.4–15.2)

## 2020-01-27 LAB — HIV ANTIBODY (ROUTINE TESTING W REFLEX): HIV Screen 4th Generation wRfx: NONREACTIVE

## 2020-01-27 LAB — LACTIC ACID, PLASMA: Lactic Acid, Venous: 1.8 mmol/L (ref 0.5–1.9)

## 2020-01-27 LAB — APTT: aPTT: 28 seconds (ref 24–36)

## 2020-01-27 LAB — GLUCOSE, CAPILLARY: Glucose-Capillary: 233 mg/dL — ABNORMAL HIGH (ref 70–99)

## 2020-01-27 LAB — CBG MONITORING, ED: Glucose-Capillary: 416 mg/dL — ABNORMAL HIGH (ref 70–99)

## 2020-01-27 LAB — AMMONIA: Ammonia: 22 umol/L (ref 9–35)

## 2020-01-27 MED ORDER — VANCOMYCIN HCL 1750 MG/350ML IV SOLN
1750.0000 mg | Freq: Once | INTRAVENOUS | Status: AC
  Administered 2020-01-27: 1750 mg via INTRAVENOUS
  Filled 2020-01-27: qty 350

## 2020-01-27 MED ORDER — SODIUM CHLORIDE 0.9 % IV SOLN: 1000.0000 mL | INTRAVENOUS | Status: DC

## 2020-01-27 MED ORDER — IOHEXOL 350 MG/ML SOLN
100.0000 mL | Freq: Once | INTRAVENOUS | Status: AC | PRN
  Administered 2020-01-27: 80 mL via INTRAVENOUS

## 2020-01-27 MED ORDER — SODIUM CHLORIDE 0.9 % IV SOLN
2.0000 g | Freq: Once | INTRAVENOUS | Status: AC
  Administered 2020-01-27: 2 g via INTRAVENOUS
  Filled 2020-01-27: qty 2

## 2020-01-27 MED ORDER — ASPIRIN 81 MG PO CHEW
81.0000 mg | CHEWABLE_TABLET | Freq: Every day | ORAL | Status: DC
  Administered 2020-01-27 – 2020-02-03 (×8): 81 mg via JEJUNOSTOMY
  Filled 2020-01-27 (×8): qty 1

## 2020-01-27 MED ORDER — SCOPOLAMINE 1 MG/3DAYS TD PT72
1.0000 | MEDICATED_PATCH | TRANSDERMAL | Status: DC
  Administered 2020-01-27 – 2020-02-02 (×3): 1.5 mg via TRANSDERMAL
  Filled 2020-01-27 (×3): qty 1

## 2020-01-27 MED ORDER — DONEPEZIL HCL 5 MG PO TABS
10.0000 mg | ORAL_TABLET | Freq: Every day | ORAL | Status: DC
  Administered 2020-01-27 – 2020-02-03 (×8): 10 mg
  Filled 2020-01-27 (×8): qty 2

## 2020-01-27 MED ORDER — POLYETHYLENE GLYCOL 3350 17 G PO PACK
17.0000 g | PACK | Freq: Two times a day (BID) | ORAL | Status: DC
  Administered 2020-01-27 – 2020-02-03 (×7): 17 g
  Filled 2020-01-27 (×9): qty 1

## 2020-01-27 MED ORDER — VANCOMYCIN HCL IN DEXTROSE 1-5 GM/200ML-% IV SOLN: 1000.0000 mg | Freq: Once | INTRAVENOUS | Status: DC

## 2020-01-27 MED ORDER — JEVITY 1.5 CAL/FIBER PO LIQD
237.0000 mL | Freq: Every day | ORAL | Status: DC
  Administered 2020-01-28 – 2020-01-30 (×12): 237 mL
  Filled 2020-01-27 (×16): qty 237

## 2020-01-27 MED ORDER — AMLODIPINE BESYLATE 5 MG PO TABS
5.0000 mg | ORAL_TABLET | Freq: Every day | ORAL | Status: DC
  Administered 2020-01-27 – 2020-01-29 (×3): 5 mg
  Filled 2020-01-27 (×3): qty 1

## 2020-01-27 MED ORDER — ACETAMINOPHEN 650 MG RE SUPP
650.0000 mg | Freq: Once | RECTAL | Status: AC
  Administered 2020-01-27: 650 mg via RECTAL
  Filled 2020-01-27: qty 1

## 2020-01-27 MED ORDER — SODIUM CHLORIDE 0.9 % IV BOLUS
1000.0000 mL | Freq: Once | INTRAVENOUS | Status: AC
  Administered 2020-01-27: 1000 mL via INTRAVENOUS

## 2020-01-27 MED ORDER — ATORVASTATIN CALCIUM 80 MG PO TABS
80.0000 mg | ORAL_TABLET | Freq: Every morning | ORAL | Status: DC
  Administered 2020-01-28 – 2020-02-03 (×7): 80 mg
  Filled 2020-01-27: qty 2
  Filled 2020-01-27: qty 1
  Filled 2020-01-27 (×2): qty 2
  Filled 2020-01-27: qty 1
  Filled 2020-01-27: qty 2
  Filled 2020-01-27: qty 1
  Filled 2020-01-27 (×2): qty 2
  Filled 2020-01-27: qty 1
  Filled 2020-01-27: qty 2
  Filled 2020-01-27 (×3): qty 1

## 2020-01-27 MED ORDER — HEPARIN SODIUM (PORCINE) 5000 UNIT/ML IJ SOLN
5000.0000 [IU] | Freq: Three times a day (TID) | INTRAMUSCULAR | Status: DC
  Administered 2020-01-27 – 2020-02-03 (×22): 5000 [IU] via SUBCUTANEOUS
  Filled 2020-01-27 (×21): qty 1

## 2020-01-27 MED ORDER — INSULIN ASPART 100 UNIT/ML ~~LOC~~ SOLN
0.0000 [IU] | Freq: Three times a day (TID) | SUBCUTANEOUS | Status: DC
  Administered 2020-01-28 (×2): 15 [IU] via SUBCUTANEOUS
  Administered 2020-01-29: 11 [IU] via SUBCUTANEOUS
  Administered 2020-01-29: 15 [IU] via SUBCUTANEOUS
  Administered 2020-01-29: 11 [IU] via SUBCUTANEOUS
  Administered 2020-01-30: 3 [IU] via SUBCUTANEOUS
  Administered 2020-01-30: 5 [IU] via SUBCUTANEOUS
  Administered 2020-01-30 – 2020-01-31 (×3): 3 [IU] via SUBCUTANEOUS
  Administered 2020-01-31: 5 [IU] via SUBCUTANEOUS
  Administered 2020-02-01: 2 [IU] via SUBCUTANEOUS
  Administered 2020-02-02: 3 [IU] via SUBCUTANEOUS
  Administered 2020-02-03: 2 [IU] via SUBCUTANEOUS
  Administered 2020-02-03: 3 [IU] via SUBCUTANEOUS

## 2020-01-27 MED ORDER — CLOPIDOGREL BISULFATE 75 MG PO TABS
75.0000 mg | ORAL_TABLET | Freq: Every day | ORAL | Status: DC
  Administered 2020-01-27 – 2020-02-03 (×8): 75 mg
  Filled 2020-01-27 (×8): qty 1

## 2020-01-27 MED ORDER — CARVEDILOL 3.125 MG PO TABS
3.1250 mg | ORAL_TABLET | Freq: Two times a day (BID) | ORAL | Status: DC
  Administered 2020-01-27 – 2020-02-03 (×15): 3.125 mg
  Filled 2020-01-27 (×15): qty 1

## 2020-01-27 MED ORDER — ACETAMINOPHEN 160 MG/5ML PO SOLN
650.0000 mg | ORAL | Status: DC | PRN
  Administered 2020-01-31 – 2020-02-02 (×3): 650 mg
  Filled 2020-01-27 (×3): qty 20.3

## 2020-01-27 MED ORDER — TRAMADOL HCL 50 MG PO TABS: 50.0000 mg | ORAL_TABLET | Freq: Two times a day (BID) | ORAL | Status: DC | PRN

## 2020-01-27 MED ORDER — SODIUM CHLORIDE 0.9 % IV SOLN: INTRAVENOUS | Status: DC

## 2020-01-27 MED ORDER — VANCOMYCIN HCL 750 MG/150ML IV SOLN: 750.0000 mg | INTRAVENOUS | Status: DC

## 2020-01-27 MED ORDER — FREE WATER
200.0000 mL | Freq: Three times a day (TID) | Status: DC
  Administered 2020-01-27 – 2020-01-28 (×2): 200 mL

## 2020-01-27 MED ORDER — INSULIN GLARGINE 100 UNIT/ML ~~LOC~~ SOLN
10.0000 [IU] | Freq: Two times a day (BID) | SUBCUTANEOUS | Status: DC
  Administered 2020-01-27: 10 [IU] via SUBCUTANEOUS
  Filled 2020-01-27 (×3): qty 0.1

## 2020-01-27 MED ORDER — SENNOSIDES-DOCUSATE SODIUM 8.6-50 MG PO TABS
1.0000 | ORAL_TABLET | Freq: Two times a day (BID) | ORAL | Status: DC
  Administered 2020-01-27 – 2020-02-03 (×9): 1
  Filled 2020-01-27 (×10): qty 1

## 2020-01-27 MED ORDER — SODIUM CHLORIDE 0.9 % IV SOLN
1000.0000 mL | INTRAVENOUS | Status: DC
  Administered 2020-01-27: 1000 mL via INTRAVENOUS

## 2020-01-27 MED ORDER — SODIUM CHLORIDE 0.9 % IV SOLN
2.0000 g | Freq: Two times a day (BID) | INTRAVENOUS | Status: DC
  Administered 2020-01-27: 2 g via INTRAVENOUS
  Filled 2020-01-27 (×2): qty 2

## 2020-01-27 MED ORDER — FERROUS SULFATE 300 (60 FE) MG/5ML PO SYRP
300.0000 mg | ORAL_SOLUTION | Freq: Every day | ORAL | Status: DC
  Administered 2020-01-28 – 2020-02-03 (×7): 300 mg
  Filled 2020-01-27 (×9): qty 5

## 2020-01-27 MED ORDER — PANTOPRAZOLE SODIUM 40 MG PO PACK
40.0000 mg | PACK | Freq: Every day | ORAL | Status: DC
  Administered 2020-01-28 – 2020-02-03 (×7): 40 mg
  Filled 2020-01-27 (×10): qty 20

## 2020-01-27 NOTE — ED Triage Notes (Signed)
86 yo female from [facility] sent here for altered MS X 3 days. Nursing staff reported pt aspirated Wed night and was actively vomiting Wed and Thurs. No vomiting today but +febrile.   Hx of CVA with right sided deficit

## 2020-01-27 NOTE — ED Notes (Signed)
Please contact Daughter Caren Griffins for updates at [phone].

## 2020-01-27 NOTE — Progress Notes (Signed)
A consult was received from an ED physician for vancomycin & cefepime per pharmacy dosing.  The patient's profile has been reviewed for ht/wt/allergies/indication/available labs.   A one time order has been placed for vancomycin 1750 mg & cefepime 2 gm.    Further antibiotics/pharmacy consults should be ordered by admitting physician if indicated.                       Thank you,  Eudelia Bunch, Pharm.D [phone] 01/27/2020 2:22 PM

## 2020-01-27 NOTE — ED Notes (Addendum)
ED TO INPATIENT HANDOFF REPORT  ED Nurse Name and Phone #: Fredonia Highland [phone]  S Name/Age/Gender Renee Rival 86 y.o. female Room/Bed: WA18/WA18  Code Status   Code Status: Full Code  Home/SNF/Other Skilled nursing facility Patient oriented to: self, place, time and situation Is this baseline? no  Triage Complete: Triage complete  Chief Complaint Aspiration pneumonia [facility]) [J69.0]  Triage Note 86 yo female from [facility] sent here for altered MS X 3 days. Nursing staff reported pt aspirated Wed night and was actively vomiting Wed and Thurs. No vomiting today but +febrile.   Hx of CVA with right sided deficit    Allergies No Known Allergies  Level of Care/Admitting Diagnosis ED Disposition    ED Disposition Condition Comment   Admit  Hospital Area: [place] [[id number]]  Level of Care: Med-Surg [16]  May admit patient to [facility] or [facility] if equivalent level of care is available:: No  Covid Evaluation: Asymptomatic Screening Protocol (No Symptoms)  Diagnosis: Aspiration pneumonia [facility]) [[id number]]  Admitting Physician: [name] [[id number]]  Attending Physician: [name] [[id number]]  Estimated length of stay: past midnight tomorrow  Certification:: I certify this patient will need inpatient services for at least 2 midnights       B Medical/Surgery History Past Medical History:  Diagnosis Date  . Anemia   . Arthritis   . Celiac disease   . Chronic systolic CHF (congestive heart failure) ([place])   . Complete heart block ([place])   . Coronary artery disease   . Diabetes mellitus    INSULIN DEPENDENT  . GERD (gastroesophageal reflux disease)   . Glaucoma   . Headache(784.0)   . Heart disease   . Hypercholesterolemia   . Hypertension   . Iron deficiency anemia, unspecified   . Ischemic cardiomyopathy   . Memory difficulties 04/14/2019  . Shortness of breath   . Sleep apnea    uses cpap  . Stroke ([place])   . Unspecified  constipation    Past Surgical History:  Procedure Laterality Date  . BACK SURGERY    . BI-VENTRICULAR PACEMAKER INSERTION (CRT-P)  March 2014   [facility]  . BIV PACEMAKER GENERATOR CHANGEOUT N/A 06/29/2019   Procedure: BIV PACEMAKER GENERATOR CHANGEOUT;  Surgeon: Evans Lance, MD;  Location: [place] CV LAB;  Service: Cardiovascular;  Laterality: N/A;  . CARDIAC CATHETERIZATION  09/02/2012  . CARDIAC SURGERY    . CORONARY ANGIOPLASTY WITH STENT PLACEMENT  09/02/2012   RCA  . FLEXIBLE SIGMOIDOSCOPY N/A 12/30/2019   Procedure: FLEXIBLE SIGMOIDOSCOPY;  Surgeon: Carol Ada, MD;  Location: [place];  Service: Endoscopy;  Laterality: N/A;  . IR GASTROSTOMY TUBE MOD SED  01/03/2020  . PERCUTANEOUS CORONARY STENT INTERVENTION (PCI-S) N/A 09/02/2012   Procedure: PERCUTANEOUS CORONARY STENT INTERVENTION (PCI-S);  Surgeon: Clent Demark, MD;  Location: [facility] CATH LAB;  Service: Cardiovascular;  Laterality: N/A;  . PERMANENT PACEMAKER INSERTION N/A 11/29/2012   Procedure: PERMANENT PACEMAKER INSERTION;  Surgeon: Deboraha Sprang, MD;  Location: [facility] CATH LAB;  Service: Cardiovascular;  Laterality: N/A;  . TEMPORARY PACEMAKER INSERTION N/A 11/28/2012   Procedure: TEMPORARY PACEMAKER INSERTION;  Surgeon: Clent Demark, MD;  Location: [place] CATH LAB;  Service: Cardiovascular;  Laterality: N/A;     A IV Location/Drains/Wounds Patient Lines/Drains/Airways Status   Active Line/Drains/Airways    Name:   Placement date:   Placement time:   Site:   Days:   Peripheral IV 01/27/20 Right Forearm  01/27/20    1447    Forearm   less than 1   Incision (Closed) 01/03/20 Abdomen Mid   01/03/20    1105     24   Pressure Injury 12/30/19 Heel Right Deep Tissue Pressure Injury - Purple or maroon localized area of discolored intact skin or blood-filled blister due to damage of underlying soft tissue from pressure and/or shear.   12/30/19    2000     28          Intake/Output Last 24 hours  Intake/Output  Summary (Last 24 hours) at 01/27/2020 1944 Last data filed at 01/27/2020 1600 Gross per 24 hour  Intake 1100 ml  Output 1 ml  Net 1099 ml    Labs/Imaging Results for orders placed or performed during the hospital encounter of 01/27/20 (from the past 48 hour(s))  Lactic acid, plasma     Status: None   Collection Time: 01/27/20  1:33 PM  Result Value Ref Range   Lactic Acid, Venous 1.8 0.5 - 1.9 mmol/L    Comment: Performed at [facility], [place] [address], [place], [place] [address]  CBG monitoring, ED     Status: Abnormal   Collection Time: 01/27/20  1:46 PM  Result Value Ref Range   Glucose-Capillary 416 (H) 70 - 99 mg/dL    Comment: Glucose reference range applies only to samples taken after fasting for at least 8 hours.  Comprehensive metabolic panel     Status: Abnormal   Collection Time: 01/27/20  2:53 PM  Result Value Ref Range   Sodium 150 (H) 135 - 145 mmol/L   Potassium 4.6 3.5 - 5.1 mmol/L   Chloride 110 98 - 111 mmol/L   CO2 29 22 - 32 mmol/L   Glucose, Bld 424 (H) 70 - 99 mg/dL    Comment: Glucose reference range applies only to samples taken after fasting for at least 8 hours.   BUN 43 (H) 8 - 23 mg/dL   Creatinine, Ser 1.21 (H) 0.44 - 1.00 mg/dL   Calcium 11.1 (H) 8.9 - 10.3 mg/dL   Total Protein 7.8 6.5 - 8.1 g/dL   Albumin 3.4 (L) 3.5 - 5.0 g/dL   AST 62 (H) 15 - 41 U/L   ALT 78 (H) 0 - 44 U/L   Alkaline Phosphatase 89 38 - 126 U/L   Total Bilirubin 0.5 0.3 - 1.2 mg/dL   GFR calc non Af Amer 40 (L) >60 mL/min   GFR calc Af Amer 46 (L) >60 mL/min   Anion gap 11 5 - 15    Comment: Performed at [facility], [place] [address], [place], [place] [address]  CBC WITH DIFFERENTIAL     Status: Abnormal   Collection Time: 01/27/20  2:53 PM  Result Value Ref Range   WBC 15.6 (H) 4.0 - 10.5 K/uL   RBC 4.04 3.87 - 5.11 MIL/uL   Hemoglobin 11.5 (L) 12.0 - 15.0 g/dL   HCT 38.7 36.0 - 46.0 %   MCV 95.8 80.0 - 100.0 fL   MCH 28.5 26.0 -  34.0 pg   MCHC 29.7 (L) 30.0 - 36.0 g/dL   RDW 13.2 11.5 - 15.5 %   Platelets 214 150 - 400 K/uL    Comment: SPECIMEN CHECKED FOR CLOTS PLATELET COUNT CONFIRMED BY SMEAR    nRBC 0.0 0.0 - 0.2 %   Neutrophils Relative % 79 %   Neutro Abs 12.1 (H) 1.7 - 7.7 K/uL  Lymphocytes Relative 14 %   Lymphs Abs 2.2 0.7 - 4.0 K/uL   Monocytes Relative 7 %   Monocytes Absolute 1.2 (H) 0.1 - 1.0 K/uL   Eosinophils Relative 0 %   Eosinophils Absolute 0.0 0.0 - 0.5 K/uL   Basophils Relative 0 %   Basophils Absolute 0.0 0.0 - 0.1 K/uL   Immature Granulocytes 0 %   Abs Immature Granulocytes 0.05 0.00 - 0.07 K/uL   Burr Cells PRESENT     Comment: Performed at [facility], [place] [address] [place], [place] [address]  APTT     Status: None   Collection Time: 01/27/20  2:54 PM  Result Value Ref Range   aPTT 28 24 - 36 seconds    Comment: Performed at [facility], [place] [address] [place], [place] [address]  Protime-INR     Status: None   Collection Time: 01/27/20  2:54 PM  Result Value Ref Range   Prothrombin Time 13.6 11.4 - 15.2 seconds   INR 1.1 0.8 - 1.2    Comment: (NOTE) INR goal varies based on device and disease states. Performed at [facility], [place] [address], [place], [place] [address]   Urinalysis, Complete w Microscopic     Status: Abnormal   Collection Time: 01/27/20  5:39 PM  Result Value Ref Range   Color, Urine RED (A) YELLOW   APPearance CLOUDY (A) CLEAR   Specific Gravity, Urine 1.014 1.005 - 1.030   pH 5.0 5.0 - 8.0   Glucose, UA >=500 (A) NEGATIVE mg/dL   Hgb urine dipstick NEGATIVE NEGATIVE   Bilirubin Urine NEGATIVE NEGATIVE   Ketones, ur NEGATIVE NEGATIVE mg/dL   Protein, ur 30 (A) NEGATIVE mg/dL   Nitrite NEGATIVE NEGATIVE   Leukocytes,Ua LARGE (A) NEGATIVE   RBC / HPF 21-50 0 - 5 RBC/hpf   WBC, UA >50 (H) 0 - 5 WBC/hpf   Bacteria, UA MANY (A) NONE SEEN   WBC Clumps PRESENT    Mucus PRESENT      Comment: Performed at [facility], [place] [address], [place], [place] [address]  Ammonia     Status: None   Collection Time: 01/27/20  6:17 PM  Result Value Ref Range   Ammonia 22 9 - 35 umol/L    Comment: Performed at [facility], [place] [address], [place],  [address]   CT Head Wo Contrast  Result Date: 01/27/2020 CLINICAL DATA:  Encephalopathy. Insulin-dependent diabetes, CHF. Altered mental status. Aspiration 2 nights ago. EXAM: CT HEAD WITHOUT CONTRAST TECHNIQUE: Contiguous axial images were obtained from the base of the skull through the vertex without intravenous contrast. COMPARISON:  12/15/2019 FINDINGS: Brain: There is central and cortical atrophy. Periventricular white matter changes are consistent with small vessel disease. There is no intra or extra-axial fluid collection or mass lesion. The basilar cisterns and ventricles have a normal appearance. There is no CT evidence for acute infarction or hemorrhage. Vascular: Atherosclerotic calcification of the internal carotid arteries. No hyperdense vessels. Skull: Normal. Negative for fracture or focal lesion. Sinuses/Orbits: There is minimal mucoperiosteal thickening of the maxillary sinuses bilaterally. No air-fluid levels. Other: None. IMPRESSION: 1. No evidence for acute intracranial abnormality. 2. Atrophy and small vessel disease. 3. Minimal chronic sinusitis. Electronically Signed   By: Nolon Nations M.D.   On: 01/27/2020 16:21   CT Angio Chest PE W/Cm &/Or Wo Cm  Result Date: 01/27/2020 CLINICAL DATA:  Altered mental status for 3 days.  Possible aspiration. Fever. EXAM: CT ANGIOGRAPHY CHEST WITH CONTRAST TECHNIQUE: Multidetector CT imaging of the chest was performed using the standard protocol during bolus administration of intravenous contrast. Multiplanar CT image reconstructions and MIPs were obtained to evaluate the vascular anatomy. CONTRAST:  76m OMNIPAQUE IOHEXOL 350 MG/ML SOLN  COMPARISON:  Chest radiographs 01/27/2020 and 0March 13, 2021 Abdominal CT 12/28/2019. CT cervical spine 10/10/2019 and thyroid ultrasound 08/24/2013. FINDINGS: Cardiovascular: The pulmonary arteries are well opacified with contrast to the level of the subsegmental branches. There is no evidence of acute pulmonary embolism. Mild central enlargement of the pulmonary arteries. Atherosclerosis of the aorta, great vessels and coronary arteries status post median sternotomy and CABG. AICD leads extend into the right atrium, right ventricle and coronary sinus. The heart size is normal. There is no pericardial effusion. Mediastinum/Nodes: There are no enlarged mediastinal, hilar or axillary lymph nodes.Previously biopsied large right thyroid nodule measures up to 4.1 x 3.7 cm on image 8/4, similar to previous studies. There is mild mass effect on the trachea. The esophagus demonstrates no significant findings. Lungs/Pleura: No pleural effusion or pneumothorax. Mild centrilobular emphysema. There is airway thickening and partial opacification the left lower lobe bronchus. There is left lower lobe volume loss and patchy airspace disease consistent with aspiration. Within the aerated portions of the left lower lobe, there are ill-defined nodular densities which are likely inflammatory. Upper abdomen: No significant findings within the visualized upper abdomen. Musculoskeletal/Chest wall: There is no chest wall mass or suspicious osseous finding. Review of the MIP images confirms the above findings. IMPRESSION: 1. No evidence of acute pulmonary embolism or other acute vascular findings. 2. Left lower lobe volume loss and patchy airspace disease consistent with aspiration pneumonia. Ill-defined nodular densities in the aerated portions of the left lower lobe are likely inflammatory. Radiographic follow up recommended. 3. Stable large right thyroid nodule, previously biopsied. This has been documented on prior studies (ref: J Am  Coll Radiol. 203-13-15Feb;12(2): 143-50). 4. Aortic Atherosclerosis (ICD10-I70.0). Mild central enlargement of the pulmonary arteries suggesting pulmonary arterial hypertension. Electronically Signed   By: WRichardean SaleM.D.   On: 01/27/2020 17:58   DG Chest Port 1 View  Result Date: 01/27/2020 CLINICAL DATA:  Altered mental status for 3 days. Fever. EXAM: PORTABLE CHEST 1 VIEW COMPARISON:  Radiographs 02021/03/13and 12/11/2019. Abdominal CT 12/28/2019. FINDINGS: 1417 hours. Left subclavian AICD with leads in the right atrium, right ventricle and coronary sinus. Stable heart size and mediastinal contours post median sternotomy and CABG. There is mild aortic atherosclerosis. The lungs are clear. There is no pleural effusion or pneumothorax. No acute osseous findings. Telemetry leads overlie the chest. IMPRESSION: Stable chest. No active cardiopulmonary process. Electronically Signed   By: WRichardean SaleM.D.   On: 01/27/2020 14:31    Pending Labs Unresulted Labs (From admission, onward)    Start     Ordered   01/28/20 07829 Basic metabolic panel  Tomorrow morning,   R     01/27/20 1918   01/28/20 0500  CBC  Tomorrow morning,   R     01/27/20 1918   01/28/20 0500  Creatinine, serum  Every 48 hours,   R     01/27/20 1926   01/27/20 1929  MRSA PCR Screening  Once,   STAT     01/27/20 1929   01/27/20 1917  HIV Antibody (routine testing w rflx)  (HIV Antibody (Routine testing w reflex) panel)  Once,   STAT     01/27/20 1918  01/27/20 1810  SARS CORONAVIRUS 2 (TAT 6-24 HRS) Nasopharyngeal Nasopharyngeal Swab  (Tier 3 (TAT 6-24 hrs))  Once,   STAT    Question Answer Comment  Is this test for diagnosis or screening Screening   Symptomatic for COVID-19 as defined by CDC No   Hospitalized for COVID-19 No   Admitted to ICU for COVID-19 No   Previously tested for COVID-19 Yes   Resident in a congregate (group) care setting No   Employed in healthcare setting No   Pregnant No      01/27/20 1809    01/27/20 1400  Blood Culture (routine x 2)  BLOOD CULTURE X 2,   STAT     01/27/20 1401   01/27/20 1400  Urine culture  ONCE - STAT,   STAT     01/27/20 1401          Vitals/Pain Today's Vitals   01/27/20 1800 01/27/20 1808 01/27/20 1850 01/27/20 1931  BP: (!) 154/59 (!) 154/59 (!) 158/65 (!) 156/61  Pulse: 70 70 78 78  Resp: (!) 38 (!) 30 (!) 22 (!) 26  Temp:      TempSrc:      SpO2: 100% 100% 100% 100%    Isolation Precautions Airborne and Contact precautions  Medications Medications  sodium chloride 0.9 % bolus 1,000 mL (0 mLs Intravenous Stopped 01/27/20 1600)    And  0.9 %  sodium chloride infusion (has no administration in time range)  vancomycin (VANCOREADY) IVPB 1750 mg/350 mL (1,750 mg Intravenous New Bag/Given 01/27/20 1826)  heparin injection 5,000 Units (has no administration in time range)  vancomycin (VANCOREADY) IVPB 750 mg/150 mL (has no administration in time range)  ceFEPIme (MAXIPIME) 2 g in sodium chloride 0.9 % 100 mL IVPB (has no administration in time range)  0.9 %  sodium chloride infusion (has no administration in time range)  ceFEPIme (MAXIPIME) 2 g in sodium chloride 0.9 % 100 mL IVPB (0 g Intravenous Stopped 01/27/20 1600)  iohexol (OMNIPAQUE) 350 MG/ML injection 100 mL (80 mLs Intravenous Contrast Given 01/27/20 1721)  acetaminophen (TYLENOL) suppository 650 mg (650 mg Rectal Given 01/27/20 1835)    Mobility non-ambulatory High fall risk   Focused Assessments Pulmonary Assessment Handoff:  Lung sounds:   O2 Device: Nasal Cannula O2 Flow Rate (L/min): 2 L/min      R Recommendations: See Admitting Provider Note  Report given to: silvia  Additional Notes:

## 2020-01-27 NOTE — Progress Notes (Signed)
Pharmacy Antibiotic Note  [name] is a 86 y.o. female admitted on 01/27/2020 with sepsis and concern for aspiration.  Pharmacy has been consulted for vancomycin and cefepime dosing.  Plan:  Vancomycin 1750 mg IV now, then 750 mg IV q24 hr (est AUC 467 based on SCr 1.21; used Vd 0.65 with borderline BMI 30.1)  Measure vancomycin AUC at steady state as indicated  SCr q48 while on vanc  Cefepime 2 g IV q12 hr  MRSA PCR ordered; f/u results and narrow as appropriate     Temp (24hrs), Avg:100 F (37.8 C), Min:99.6 F (37.6 C), Max:100.4 F (38 C)  Recent Labs  Lab 01/27/20 1333 01/27/20 1453  WBC  --  15.6*  CREATININE  --  1.21*  LATICACIDVEN 1.8  --     CrCl cannot be calculated (Unknown ideal weight.).    No Known Allergies  Antimicrobials this admission: 4/9 vancomycin >>  4/9 cefepime >>   Dose adjustments this admission: n/a  Microbiology results: 4/9 BCx: sent 4/9 UCx: sent  4/9 MRSA PCR: ordered  Thank you for allowing pharmacy to be a part of this patient's care.  Jesalyn Finazzo A 01/27/2020 7:27 PM

## 2020-01-27 NOTE — ED Provider Notes (Signed)
15:05: Assumed care of patient from Domenic Moras PA-C at change of shift pending labs & admission.   Please see prior provider note for full H&P.  Briefly patient is an 86 year old female with a history of CHF, CAD, complete heart block, hypercholesterolemia, hypertension, & prior stroke with residual right-sided deficits and dysphagia who presents to the emergency department from [facility] facility for evaluation of fever this AM.  There is concern for aspiration pneumonia from facility per nursing report.    Physical Exam  BP (!) 171/79 (BP Location: Right Arm)   Pulse 73   Temp (!) 100.4 F (38 C) (Rectal)   Resp (!) 24   SpO2 94%   Physical Exam Vitals and nursing note reviewed.  Constitutional:      Comments: Sleeping during assessment.   HENT:     Head: Normocephalic and atraumatic.  Pulmonary:     Effort: Tachypnea present.     Breath sounds: No wheezing.    ED Course/Procedures   Procedures Results for orders placed or performed during the hospital encounter of 01/27/20  Comprehensive metabolic panel  Result Value Ref Range   Sodium 150 (H) 135 - 145 mmol/L   Potassium 4.6 3.5 - 5.1 mmol/L   Chloride 110 98 - 111 mmol/L   CO2 29 22 - 32 mmol/L   Glucose, Bld 424 (H) 70 - 99 mg/dL   BUN 43 (H) 8 - 23 mg/dL   Creatinine, Ser 1.21 (H) 0.44 - 1.00 mg/dL   Calcium 11.1 (H) 8.9 - 10.3 mg/dL   Total Protein 7.8 6.5 - 8.1 g/dL   Albumin 3.4 (L) 3.5 - 5.0 g/dL   AST 62 (H) 15 - 41 U/L   ALT 78 (H) 0 - 44 U/L   Alkaline Phosphatase 89 38 - 126 U/L   Total Bilirubin 0.5 0.3 - 1.2 mg/dL   GFR calc non Af Amer 40 (L) >60 mL/min   GFR calc Af Amer 46 (L) >60 mL/min   Anion gap 11 5 - 15  CBC WITH DIFFERENTIAL  Result Value Ref Range   WBC 15.6 (H) 4.0 - 10.5 K/uL   RBC 4.04 3.87 - 5.11 MIL/uL   Hemoglobin 11.5 (L) 12.0 - 15.0 g/dL   HCT 38.7 36.0 - 46.0 %   MCV 95.8 80.0 - 100.0 fL   MCH 28.5 26.0 - 34.0 pg   MCHC 29.7 (L) 30.0 - 36.0 g/dL   RDW 13.2 11.5 - 15.5  %   Platelets 214 150 - 400 K/uL   nRBC 0.0 0.0 - 0.2 %   Neutrophils Relative % 79 %   Neutro Abs 12.1 (H) 1.7 - 7.7 K/uL   Lymphocytes Relative 14 %   Lymphs Abs 2.2 0.7 - 4.0 K/uL   Monocytes Relative 7 %   Monocytes Absolute 1.2 (H) 0.1 - 1.0 K/uL   Eosinophils Relative 0 %   Eosinophils Absolute 0.0 0.0 - 0.5 K/uL   Basophils Relative 0 %   Basophils Absolute 0.0 0.0 - 0.1 K/uL   Immature Granulocytes 0 %   Abs Immature Granulocytes 0.05 0.00 - 0.07 K/uL   Burr Cells PRESENT   Lactic acid, plasma  Result Value Ref Range   Lactic Acid, Venous 1.8 0.5 - 1.9 mmol/L  APTT  Result Value Ref Range   aPTT 28 24 - 36 seconds  Protime-INR  Result Value Ref Range   Prothrombin Time 13.6 11.4 - 15.2 seconds   INR 1.1 0.8 - 1.2  CBG monitoring, ED  Result Value Ref Range   Glucose-Capillary 416 (H) 70 - 99 mg/dL   DG Abd 1 View  Result Date: 01/03/2020 CLINICAL DATA:  Evaluate barium prior to potential percutaneous gastrostomy tube placement. EXAM: ABDOMEN - 1 VIEW COMPARISON:  CT abdomen pelvis-12/29/2019 FINDINGS: Enteric contrast is seen throughout the colon. No evidence of enteric obstruction. Enteric tube tip overlies expected location the gastric antrum. Nondiagnostic evaluation for pneumoperitoneum secondary to supine positioning and exclusion of the lower thorax. No pneumatosis or portal venous gas. Multiple calcified fibroids overlie the pelvis as demonstrated on previous abdominal CT. No acute osseous abnormalities. IMPRESSION: Enteric contrast seen throughout the colon. No evidence of enteric obstruction. Electronically Signed   By: Sandi Mariscal M.D.   On: 01/03/2020 09:33   CT Head Wo Contrast  Result Date: 01/27/2020 CLINICAL DATA:  Encephalopathy. Insulin-dependent diabetes, CHF. Altered mental status. Aspiration 2 nights ago. EXAM: CT HEAD WITHOUT CONTRAST TECHNIQUE: Contiguous axial images were obtained from the base of the skull through the vertex without intravenous  contrast. COMPARISON:  12/15/2019 FINDINGS: Brain: There is central and cortical atrophy. Periventricular white matter changes are consistent with small vessel disease. There is no intra or extra-axial fluid collection or mass lesion. The basilar cisterns and ventricles have a normal appearance. There is no CT evidence for acute infarction or hemorrhage. Vascular: Atherosclerotic calcification of the internal carotid arteries. No hyperdense vessels. Skull: Normal. Negative for fracture or focal lesion. Sinuses/Orbits: There is minimal mucoperiosteal thickening of the maxillary sinuses bilaterally. No air-fluid levels. Other: None. IMPRESSION: 1. No evidence for acute intracranial abnormality. 2. Atrophy and small vessel disease. 3. Minimal chronic sinusitis. Electronically Signed   By: Nolon Nations M.D.   On: 01/27/2020 16:21   CT Angio Chest PE W/Cm &/Or Wo Cm  Result Date: 01/27/2020 CLINICAL DATA:  Altered mental status for 3 days. Possible aspiration. Fever. EXAM: CT ANGIOGRAPHY CHEST WITH CONTRAST TECHNIQUE: Multidetector CT imaging of the chest was performed using the standard protocol during bolus administration of intravenous contrast. Multiplanar CT image reconstructions and MIPs were obtained to evaluate the vascular anatomy. CONTRAST:  74m OMNIPAQUE IOHEXOL 350 MG/ML SOLN COMPARISON:  Chest radiographs 01/27/2020 and 12/15/2019. Abdominal CT 12/28/2019. CT cervical spine 10/10/2019 and thyroid ultrasound 08/24/2013. FINDINGS: Cardiovascular: The pulmonary arteries are well opacified with contrast to the level of the subsegmental branches. There is no evidence of acute pulmonary embolism. Mild central enlargement of the pulmonary arteries. Atherosclerosis of the aorta, great vessels and coronary arteries status post median sternotomy and CABG. AICD leads extend into the right atrium, right ventricle and coronary sinus. The heart size is normal. There is no pericardial effusion. Mediastinum/Nodes:  There are no enlarged mediastinal, hilar or axillary lymph nodes.Previously biopsied large right thyroid nodule measures up to 4.1 x 3.7 cm on image 8/4, similar to previous studies. There is mild mass effect on the trachea. The esophagus demonstrates no significant findings. Lungs/Pleura: No pleural effusion or pneumothorax. Mild centrilobular emphysema. There is airway thickening and partial opacification the left lower lobe bronchus. There is left lower lobe volume loss and patchy airspace disease consistent with aspiration. Within the aerated portions of the left lower lobe, there are ill-defined nodular densities which are likely inflammatory. Upper abdomen: No significant findings within the visualized upper abdomen. Musculoskeletal/Chest wall: There is no chest wall mass or suspicious osseous finding. Review of the MIP images confirms the above findings. IMPRESSION: 1. No evidence of acute pulmonary embolism or other acute vascular findings.  2. Left lower lobe volume loss and patchy airspace disease consistent with aspiration pneumonia. Ill-defined nodular densities in the aerated portions of the left lower lobe are likely inflammatory. Radiographic follow up recommended. 3. Stable large right thyroid nodule, previously biopsied. This has been documented on prior studies (ref: J Am Coll Radiol. 2015 Feb;12(2): 143-50). 4. Aortic Atherosclerosis (ICD10-I70.0). Mild central enlargement of the pulmonary arteries suggesting pulmonary arterial hypertension. Electronically Signed   By: Richardean Sale M.D.   On: 01/27/2020 17:58   IR GASTROSTOMY TUBE MOD SED  Result Date: 01/03/2020 INDICATION: Dysphagia. Please perform percutaneous gastrostomy tube placement for enteric nutrition supplementation purposes. EXAM: PULL TROUGH GASTROSTOMY TUBE PLACEMENT COMPARISON:  CT abdomen pelvis-12/28/2019; abdominal radiograph-earlier same day MEDICATIONS: Ancef 2 gm IV; Antibiotics were administered within 1 hour of the  procedure. Glucagon 1 mg IV CONTRAST:  10 mL of Omnipaque 300 administered into the gastric lumen. ANESTHESIA/SEDATION: Moderate (conscious) sedation was employed during this procedure. A total of Versed 0.5 mg and Fentanyl 25 mcg was administered intravenously. Moderate Sedation Time: 10 minutes. The patient's level of consciousness and vital signs were monitored continuously by radiology nursing throughout the procedure under my direct supervision. FLUOROSCOPY TIME:  3 minutes, 42 seconds (16.1 mGy) COMPLICATIONS: None immediate. PROCEDURE: Informed written consent was obtained from the patient following explanation of the procedure, risks, benefits and alternatives. A time out was performed prior to the initiation of the procedure. Ultrasound scanning was performed to demarcate the edge of the left lobe of the liver. Maximal barrier sterile technique utilized including caps, mask, sterile gowns, sterile gloves, large sterile drape, hand hygiene and Betadine prep. The left upper quadrant was sterilely prepped and draped. An oral gastric catheter was inserted into the stomach under fluoroscopy. The existing nasogastric feeding tube was removed. The left costal margin and barium/air opacified transverse colon were identified and avoided. Air was injected into the stomach for insufflation and visualization under fluoroscopy. Under sterile conditions a 17 gauge trocar needle was utilized to access the stomach percutaneously beneath the left subcostal margin after the overlying soft tissues were anesthetized with 1% Lidocaine with epinephrine. Needle position was confirmed within the stomach with aspiration of air and injection of small amount of contrast. A single T tack was deployed for gastropexy. Over an Amplatz guide wire, a 9-French sheath was inserted into the stomach. A snare device was utilized to capture the oral gastric catheter. The snare device was pulled retrograde from the stomach up the esophagus and  out the oropharynx. The 20-French pull-through gastrostomy was connected to the snare device and pulled antegrade through the oropharynx down the esophagus into the stomach and then through the percutaneous tract external to the patient. The gastrostomy was assembled externally. Contrast injection confirms position in the stomach. Several spot radiographic images were obtained in various obliquities for documentation. The patient tolerated procedure well without immediate post procedural complication. FINDINGS: After successful fluoroscopic guided placement, the gastrostomy tube is appropriately positioned with internal disc against the ventral aspect of the gastric lumen. IMPRESSION: Successful fluoroscopic insertion of a 20-French pull-through gastrostomy tube. The gastrostomy may be used immediately for medication administration and in 24 hrs for the initiation of feeds. Electronically Signed   By: Sandi Mariscal M.D.   On: 01/03/2020 11:34   DG Chest Port 1 View  Result Date: 01/27/2020 CLINICAL DATA:  Altered mental status for 3 days. Fever. EXAM: PORTABLE CHEST 1 VIEW COMPARISON:  Radiographs 12/15/2019 and 12/11/2019. Abdominal CT 12/28/2019. FINDINGS: 1417 hours. Left  subclavian AICD with leads in the right atrium, right ventricle and coronary sinus. Stable heart size and mediastinal contours post median sternotomy and CABG. There is mild aortic atherosclerosis. The lungs are clear. There is no pleural effusion or pneumothorax. No acute osseous findings. Telemetry leads overlie the chest. IMPRESSION: Stable chest. No active cardiopulmonary process. Electronically Signed   By: Richardean Sale M.D.   On: 01/27/2020 14:31    MDM   Prior team activated code sepsis, broad-spectrum antibiotics initiated, patient with dry mucous membranes, 1 L saline ordered, no 30 cc/kg bolus given patient is not hypotensive and is a patient with CHF.  Plan at change of shift is to follow-up on laboratory studies, head CT,  and consult for admission.  16:09: CONSULT: I called & spoke with [place] NP with [facility] (patient's facility) for further information. Patient is NPO secondary to her dysphagia, there was family concern for increased secretions, NP ordered Biotene swabs QID, patient had an event where she was being turned to be changed and vomited.  There was concern for aspiration.  This morning she began to have a fever and said yes when asked if she felt poorly.  Family requested patient be transported to the ER for evaluation.  Labs reveal leukocytosis with left shift, mild anemia improved from prior, increased BUN and creatinine, electrolyte abnormalities as above, mildly elevated LFTs.  She is hyperglycemic but not acidotic.  Lactic acid WNL.  Chest x-ray without acute process. Given concern for aspiration pneumonia we will proceed with CT of the chest.  CT imaging without pulmonary embolism, findings are consistent with aspiration pneumonia.  Given patient meets SIRS criteria, she remains tachypneic, will consult for admission.  Findings and plan of care discussed with supervising physician Dr. Eulis Foster who has evaluated the patient is in agreement with plan.  Patient family at bedside updated on results and plan of care, they are in agreement as well.  18:45: CONSULT: Discussed with hospitalist Dr. Flossie Buffy- accepts admission.   [name] was evaluated in Emergency Department on 01/27/2020 for the symptoms described in the history of present illness. He/she was evaluated in the context of the global COVID-19 pandemic, which necessitated consideration that the patient might be at risk for infection with the SARS-CoV-2 virus that causes COVID-19. Institutional protocols and algorithms that pertain to the evaluation of patients at risk for COVID-19 are in a state of rapid change based on information released by regulatory bodies including the CDC and federal and state organizations. These policies and  algorithms were followed during the patient's care in the ED.        Amaryllis Dyke, PA-C 01/27/20 1846    Daleen Bo, MD 01/28/20 1313

## 2020-01-27 NOTE — H&P (Signed)
History and Physical    VASTI YAGI QAS:[id number] DOB: 03/08/1928 DOA: 01/27/2020  PCP: [name], MD  Patient coming from: [facility]  I have personally briefly reviewed patient's old medical records in [place]  Chief Complaint: Concerns for aspiration pneumonia  HPI: [name] is a 86 y.o. 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 Daughter at bedside provides the history as patient is mostly aphasic other than being able to answer yes or no.  Daughter reports that she was only told by the rehab center today that patient had vomiting 2 days ago and I was concerned today that she had aspiration pneumonia.  They did not tell her whether she was having fever or any respiratory distress.  Patient is PEG tube dependent and is NPO following a recent stroke in March. She is currently bed/wheelchair bound and only able to answer yes or no at baseline.   ED Course: She was febrile up to 100 point 68F, tachypneic requiring up to 2 L and was mildly hypertensive.  Labs were notable for WBC of 15.6, hemoglobin of 11.5 that appears hemoconcentrated compared to prior baseline of 9.  Sodium of 150, glucose of 424, creatinine of 1.21 from a prior of 0.75.  Calcium of 11.1, mildly elevated AST of 62 and ALT of 78.  UA shows large leukocyte and negative nitrite with many bacteria. CTA chest showed no PE but had left lower lobe opacity concerning for aspiration pneumonia.   Review of Systems: Unable to fully obtain given patient's aphasia.  She denies any frank pain.  Past Medical History:  Diagnosis Date  . Anemia   . Arthritis   . Celiac disease   . Chronic systolic CHF (congestive heart failure) ([place])   . Complete heart block ([place])   . Coronary  artery disease   . Diabetes mellitus    INSULIN DEPENDENT  . GERD (gastroesophageal reflux disease)   . Glaucoma   . Headache(784.0)   . Heart disease   . Hypercholesterolemia   . Hypertension   . Iron deficiency anemia, unspecified   . Ischemic cardiomyopathy   . Memory difficulties 04/14/2019  . Shortness of breath   . Sleep apnea    uses cpap  . Stroke ([place])   . Unspecified constipation     Past Surgical History:  Procedure Laterality Date  . BACK SURGERY    . BI-VENTRICULAR PACEMAKER INSERTION (CRT-P)  March 2014   [facility]  . BIV PACEMAKER GENERATOR CHANGEOUT N/A 06/29/2019   Procedure: BIV PACEMAKER GENERATOR CHANGEOUT;  Surgeon: Evans Lance, MD;  Location: [facility];  Service: Cardiovascular;  Laterality: N/A;  . CARDIAC CATHETERIZATION  09/02/2012  . CARDIAC SURGERY    . CORONARY ANGIOPLASTY WITH STENT PLACEMENT  09/02/2012   [facility]  . FLEXIBLE SIGMOIDOSCOPY N/A 12/30/2019   Procedure: FLEXIBLE SIGMOIDOSCOPY;  Surgeon: Carol Ada, MD;  Location: [facility];  Service: Endoscopy;  Laterality: N/A;  . IR GASTROSTOMY TUBE MOD SED  01/03/2020  . PERCUTANEOUS CORONARY STENT INTERVENTION (PCI-S) N/A 09/02/2012   Procedure: PERCUTANEOUS CORONARY STENT INTERVENTION (PCI-S);  Surgeon: Clent Demark, MD;  Location: [facility] CATH LAB;  Service: Cardiovascular;  Laterality: N/A;  . PERMANENT PACEMAKER INSERTION N/A 11/29/2012   Procedure: PERMANENT PACEMAKER INSERTION;  Surgeon: Revonda Standard  Caryl Comes, MD;  Location: [facility] C[facility];  Service: Cardiovascular;  Laterality: N/A;  . TEMPORARY PACEMAKER INSERTION N/A 11/28/2012   Procedure: TEMPORARY PACEMAKER INSERTION;  Surgeon: Clent Demark, MD;  Location: [facility];  Service: Cardiovascular;  Laterality: N/A;     reports that she quit smoking about 43 years ago. She has never used smokeless tobacco. She reports that she does not drink alcohol or use drugs.  No Known Allergies  Family History  Problem Relation Age of Onset   . Diabetes Mother   . Hypertension Mother   . Hyperlipidemia Mother   . Cancer Sister   . Dementia Sister   . Neuropathy Sister      Prior to Admission medications   Medication Sig Start Date End Date Taking? Authorizing Provider  acetaminophen (TYLENOL) 160 MG/5ML solution Place 20.3 mLs (650 mg total) into feeding tube every 4 (four) hours as needed for mild pain (or temp > 37.5 C (99.5 F)). 01/10/20   Angiulli, Lavon Paganini, PA-C  amLODipine (NORVASC) 5 MG tablet Place 1 tablet (5 mg total) into feeding tube daily. 01/10/20   Angiulli, Lavon Paganini, PA-C  aspirin 81 MG chewable tablet 1 tablet (81 mg total) by Per J Tube route daily. 01/10/20   Angiulli, Lavon Paganini, PA-C  atorvastatin (LIPITOR) 80 MG tablet Place 1 tablet (80 mg total) into feeding tube every morning. 01/10/20   Angiulli, Lavon Paganini, PA-C  brimonidine (ALPHAGAN) 0.2 % ophthalmic solution Place 1 drop into both eyes 2 (two) times daily. 01/10/20   Angiulli, Lavon Paganini, PA-C  carvedilol (COREG) 3.125 MG tablet Place 1 tablet (3.125 mg total) into feeding tube 2 (two) times daily. 01/10/20   Angiulli, Lavon Paganini, PA-C  clopidogrel (PLAVIX) 75 MG tablet Place 1 tablet (75 mg total) into feeding tube daily. 01/10/20   Angiulli, Lavon Paganini, PA-C  donepezil (ARICEPT) 10 MG tablet Place 1 tablet (10 mg total) into feeding tube at bedtime. 01/10/20   Angiulli, Lavon Paganini, PA-C  dorzolamide (TRUSOPT) 2 % ophthalmic solution Place 1 drop into both eyes 3 (three) times daily. 01/10/20   Angiulli, Lavon Paganini, PA-C  ferrous sulfate 300 (60 Fe) MG/5ML syrup Place 5 mLs (300 mg total) into feeding tube daily with breakfast. 01/10/20   Angiulli, Lavon Paganini, PA-C  insulin glargine (LANTUS) 100 UNIT/ML injection Inject 0.19 mLs (19 Units total) into the skin 2 (two) times daily. 01/10/20   Angiulli, Lavon Paganini, PA-C  latanoprost (XALATAN) 0.005 % ophthalmic solution Place 1 drop into both eyes at bedtime. 01/10/20   Angiulli, Lavon Paganini, PA-C  lidocaine (LIDODERM) 5 % Place 1  patch onto the skin daily. Remove & Discard patch within 12 hours or as directed by MD 01/10/20   Angiulli, Lavon Paganini, PA-C  Nutritional Supplements (FEEDING SUPPLEMENT, JEVITY 1.5 CAL/FIBER,) LIQD Place 237 mLs into feeding tube 5 (five) times daily. 01/10/20   Angiulli, Lavon Paganini, PA-C  pantoprazole sodium (PROTONIX) 40 mg/20 mL PACK Place 20 mLs (40 mg total) into feeding tube daily before breakfast. 01/10/20   Angiulli, Lavon Paganini, PA-C  polyethylene glycol (MIRALAX / GLYCOLAX) 17 g packet Place 17 g into feeding tube 2 (two) times daily. 01/10/20   Angiulli, Lavon Paganini, PA-C  scopolamine (TRANSDERM-SCOP) 1 MG/3DAYS Place 1 patch (1.5 mg total) onto the skin every 3 (three) days. 01/12/20   Angiulli, Lavon Paganini, PA-C  senna-docusate (SENOKOT-S) 8.6-50 MG tablet Place 1 tablet into feeding tube 2 (two) times daily. 01/10/20   [place], Lavon Paganini,  PA-C  traMADol (ULTRAM) 50 MG tablet Place 1 tablet (50 mg total) into feeding tube every 12 (twelve) hours as needed for severe pain. 01/10/20   Angiulli, Lavon Paganini, PA-C  Water For Irrigation, Sterile (FREE WATER) SOLN Place 200 mLs into feeding tube 3 (three) times daily. 01/10/20   Cathlyn Parsons, PA-C    Physical Exam: Vitals:   01/27/20 1745 01/27/20 1800 01/27/20 1808 01/27/20 1850  BP:  (!) 154/59 (!) 154/59 (!) 158/65  Pulse: 77 70 70 78  Resp: (!) 26 (!) 38 (!) 30 (!) 22  Temp:      TempSrc:      SpO2: 98% 100% 100% 100%    Constitutional: NAD, calm, comfortable, elderly female laying flat in bed Vitals:   01/27/20 1745 01/27/20 1800 01/27/20 1808 01/27/20 1850  BP:  (!) 154/59 (!) 154/59 (!) 158/65  Pulse: 77 70 70 78  Resp: (!) 26 (!) 38 (!) 30 (!) 22  Temp:      TempSrc:      SpO2: 98% 100% 100% 100%   Eyes: PERRL, lids and conjunctivae normal ENMT: Mucous membranes are moist. Neck: normal, supple, Scopolamine patch in place behind right ear Respiratory: Crackles heard with anterior exam, patient unable to sit up for posterior lung  auscultation. Normal respiratory effort on 2 L via nasal cannula. No accessory muscle use.  Cardiovascular: Regular rate and rhythm, no murmurs / rubs / gallops. No extremity edema. 2+ pedal pulses.   Abdomen: no tenderness, no masses palpated.  PEG tube in place left quadrant.  Bowel sounds positive.  Musculoskeletal: no clubbing / cyanosis.  Right lower extremity in immobilization boot. Skin: no rashes, lesions, ulcers. No induration Neurologic: Has left-sided facial asymmetry.  Patient only able to answer yes or no questions.  Has right-sided hemiparesis.  She is able to lift her left lower extremity but not against resistance.  Able to do left hand grip but unable to lift left upper extremity. Psychiatric: Normal mood but unable to fully assess judgment and insight given aphasia.   Labs on Admission: I have personally reviewed following labs and imaging studies  CBC: Recent Labs  Lab 01/27/20 1453  WBC 15.6*  NEUTROABS 12.1*  HGB 11.5*  HCT 38.7  MCV 95.8  PLT 353   Basic Metabolic Panel: Recent Labs  Lab 01/27/20 1453  NA 150*  K 4.6  CL 110  CO2 29  GLUCOSE 424*  BUN 43*  CREATININE 1.21*  CALCIUM 11.1*   GFR: CrCl cannot be calculated (Unknown ideal weight.). Liver Function Tests: Recent Labs  Lab 01/27/20 1453  AST 62*  ALT 78*  ALKPHOS 89  BILITOT 0.5  PROT 7.8  ALBUMIN 3.4*   No results for input(s): LIPASE, AMYLASE in the last 168 hours. Recent Labs  Lab 01/27/20 1817  AMMONIA 22   Coagulation Profile: Recent Labs  Lab 01/27/20 1454  INR 1.1   Cardiac Enzymes: No results for input(s): CKTOTAL, CKMB, CKMBINDEX, TROPONINI in the last 168 hours. BNP (last 3 results) No results for input(s): PROBNP in the last 8760 hours. HbA1C: No results for input(s): HGBA1C in the last 72 hours. CBG: Recent Labs  Lab 01/27/20 1346  GLUCAP 416*   Lipid Profile: No results for input(s): CHOL, HDL, LDLCALC, TRIG, CHOLHDL, LDLDIRECT in the last 72  hours. Thyroid Function Tests: No results for input(s): TSH, T4TOTAL, FREET4, T3FREE, THYROIDAB in the last 72 hours. Anemia Panel: No results for input(s): VITAMINB12, FOLATE, FERRITIN, TIBC, IRON, RETICCTPCT  in the last 72 hours. Urine analysis:    Component Value Date/Time   COLORURINE RED (A) 01/27/2020 1739   APPEARANCEUR CLOUDY (A) 01/27/2020 1739   LABSPEC 1.014 01/27/2020 1739   PHURINE 5.0 01/27/2020 1739   GLUCOSEU >=500 (A) 01/27/2020 1739   HGBUR NEGATIVE 01/27/2020 1739   BILIRUBINUR NEGATIVE 01/27/2020 1739   KETONESUR NEGATIVE 01/27/2020 1739   PROTEINUR 30 (A) 01/27/2020 1739   UROBILINOGEN 0.2 08/07/2015 1826   NITRITE NEGATIVE 01/27/2020 1739   LEUKOCYTESUR LARGE (A) 01/27/2020 1739    Radiological Exams on Admission: CT Head Wo Contrast  Result Date: 01/27/2020 CLINICAL DATA:  Encephalopathy. Insulin-dependent diabetes, CHF. Altered mental status. Aspiration 2 nights ago. EXAM: CT HEAD WITHOUT CONTRAST TECHNIQUE: Contiguous axial images were obtained from the base of the skull through the vertex without intravenous contrast. COMPARISON:  12/15/2019 FINDINGS: Brain: There is central and cortical atrophy. Periventricular white matter changes are consistent with small vessel disease. There is no intra or extra-axial fluid collection or mass lesion. The basilar cisterns and ventricles have a normal appearance. There is no CT evidence for acute infarction or hemorrhage. Vascular: Atherosclerotic calcification of the internal carotid arteries. No hyperdense vessels. Skull: Normal. Negative for fracture or focal lesion. Sinuses/Orbits: There is minimal mucoperiosteal thickening of the maxillary sinuses bilaterally. No air-fluid levels. Other: None. IMPRESSION: 1. No evidence for acute intracranial abnormality. 2. Atrophy and small vessel disease. 3. Minimal chronic sinusitis. Electronically Signed   By: Nolon Nations M.D.   On: 01/27/2020 16:21   CT Angio Chest PE W/Cm &/Or  Wo Cm  Result Date: 01/27/2020 CLINICAL DATA:  Altered mental status for 3 days. Possible aspiration. Fever. EXAM: CT ANGIOGRAPHY CHEST WITH CONTRAST TECHNIQUE: Multidetector CT imaging of the chest was performed using the standard protocol during bolus administration of intravenous contrast. Multiplanar CT image reconstructions and MIPs were obtained to evaluate the vascular anatomy. CONTRAST:  79m OMNIPAQUE IOHEXOL 350 MG/ML SOLN COMPARISON:  Chest radiographs 01/27/2020 and 12/15/2019. Abdominal CT 12/28/2019. CT cervical spine 10/10/2019 and thyroid ultrasound 08/24/2013. FINDINGS: Cardiovascular: The pulmonary arteries are well opacified with contrast to the level of the subsegmental branches. There is no evidence of acute pulmonary embolism. Mild central enlargement of the pulmonary arteries. Atherosclerosis of the aorta, great vessels and coronary arteries status post median sternotomy and CABG. AICD leads extend into the right atrium, right ventricle and coronary sinus. The heart size is normal. There is no pericardial effusion. Mediastinum/Nodes: There are no enlarged mediastinal, hilar or axillary lymph nodes.Previously biopsied large right thyroid nodule measures up to 4.1 x 3.7 cm on image 8/4, similar to previous studies. There is mild mass effect on the trachea. The esophagus demonstrates no significant findings. Lungs/Pleura: No pleural effusion or pneumothorax. Mild centrilobular emphysema. There is airway thickening and partial opacification the left lower lobe bronchus. There is left lower lobe volume loss and patchy airspace disease consistent with aspiration. Within the aerated portions of the left lower lobe, there are ill-defined nodular densities which are likely inflammatory. Upper abdomen: No significant findings within the visualized upper abdomen. Musculoskeletal/Chest wall: There is no chest wall mass or suspicious osseous finding. Review of the MIP images confirms the above findings.  IMPRESSION: 1. No evidence of acute pulmonary embolism or other acute vascular findings. 2. Left lower lobe volume loss and patchy airspace disease consistent with aspiration pneumonia. Ill-defined nodular densities in the aerated portions of the left lower lobe are likely inflammatory. Radiographic follow up recommended. 3. Stable large right thyroid  nodule, previously biopsied. This has been documented on prior studies (ref: J Am Coll Radiol. 2015 Feb;12(2): 143-50). 4. Aortic Atherosclerosis (ICD10-I70.0). Mild central enlargement of the pulmonary arteries suggesting pulmonary arterial hypertension. Electronically Signed   By: Richardean Sale M.D.   On: 01/27/2020 17:58   DG Chest Port 1 View  Result Date: 01/27/2020 CLINICAL DATA:  Altered mental status for 3 days. Fever. EXAM: PORTABLE CHEST 1 VIEW COMPARISON:  Radiographs 12/15/2019 and 12/11/2019. Abdominal CT 12/28/2019. FINDINGS: 1417 hours. Left subclavian AICD with leads in the right atrium, right ventricle and coronary sinus. Stable heart size and mediastinal contours post median sternotomy and CABG. There is mild aortic atherosclerosis. The lungs are clear. There is no pleural effusion or pneumothorax. No acute osseous findings. Telemetry leads overlie the chest. IMPRESSION: Stable chest. No active cardiopulmonary process. Electronically Signed   By: Richardean Sale M.D.   On: 01/27/2020 14:31    EKG: Independently reviewed.   Assessment/Plan  Sepsis secondary to aspiration pneumonia and questionable UTI Aspiration pneumonia seen on CT.  UA also has large leukocyte and negative nitrite and many bacteria but patient is unable to provide any history regarding urinary symptoms. Continue IV vancomycin and cefepime.  No anaerobic coverage is needed per guidelines and she does not have empyema on imaging. Urine culture pending  Chronic anemia Stable  Hypernatremia Will reassess following fluids  Mild transaminitis Unclear  etiology Repeat CMP in the morning to assess if any further work-up is needed  Hypercalcemia Could be just isolated.  Follow after fluids.  

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
## 2020-01-27 NOTE — ED Provider Notes (Signed)
  Face-to-face evaluation   History: Elderly female who is responsive but not able to communicate with words.  She appears comfortable.  Physical exam: Elevated respiratory rate.  No respiratory distress.  No increased work of breathing.  Abdomen is soft and nontender.  Feeding tube present left upper abdomen.  Medical screening examination/treatment/procedure(s) were conducted as a shared visit with non-physician practitioner(s) and myself.  I personally evaluated the patient during the encounter    Daleen Bo, MD 01/28/20 1314

## 2020-01-28 ENCOUNTER — Other Ambulatory Visit: Payer: Self-pay

## 2020-01-28 DIAGNOSIS — J690 Pneumonitis due to inhalation of food and vomit: Secondary | ICD-10-CM | POA: Diagnosis not present

## 2020-01-28 LAB — CBC
HCT: 34.4 % — ABNORMAL LOW (ref 36.0–46.0)
Hemoglobin: 10.1 g/dL — ABNORMAL LOW (ref 12.0–15.0)
MCH: 27.8 pg (ref 26.0–34.0)
MCHC: 29.4 g/dL — ABNORMAL LOW (ref 30.0–36.0)
MCV: 94.8 fL (ref 80.0–100.0)
Platelets: 251 10*3/uL (ref 150–400)
RBC: 3.63 MIL/uL — ABNORMAL LOW (ref 3.87–5.11)
RDW: 13.1 % (ref 11.5–15.5)
WBC: 21.3 10*3/uL — ABNORMAL HIGH (ref 4.0–10.5)
nRBC: 0 % (ref 0.0–0.2)

## 2020-01-28 LAB — GLUCOSE, CAPILLARY
Glucose-Capillary: 298 mg/dL — ABNORMAL HIGH (ref 70–99)
Glucose-Capillary: 304 mg/dL — ABNORMAL HIGH (ref 70–99)
Glucose-Capillary: 318 mg/dL — ABNORMAL HIGH (ref 70–99)
Glucose-Capillary: 353 mg/dL — ABNORMAL HIGH (ref 70–99)
Glucose-Capillary: 366 mg/dL — ABNORMAL HIGH (ref 70–99)
Glucose-Capillary: 382 mg/dL — ABNORMAL HIGH (ref 70–99)
Glucose-Capillary: 404 mg/dL — ABNORMAL HIGH (ref 70–99)
Glucose-Capillary: 424 mg/dL — ABNORMAL HIGH (ref 70–99)

## 2020-01-28 LAB — MRSA PCR SCREENING: MRSA by PCR: NEGATIVE

## 2020-01-28 LAB — COMPREHENSIVE METABOLIC PANEL
ALT: 53 U/L — ABNORMAL HIGH (ref 0–44)
AST: 28 U/L (ref 15–41)
Albumin: 3 g/dL — ABNORMAL LOW (ref 3.5–5.0)
Alkaline Phosphatase: 77 U/L (ref 38–126)
Anion gap: 8 (ref 5–15)
BUN: 34 mg/dL — ABNORMAL HIGH (ref 8–23)
CO2: 27 mmol/L (ref 22–32)
Calcium: 10.3 mg/dL (ref 8.9–10.3)
Chloride: 117 mmol/L — ABNORMAL HIGH (ref 98–111)
Creatinine, Ser: 0.89 mg/dL (ref 0.44–1.00)
GFR calc Af Amer: >60 mL/min (ref >60)
GFR calc non Af Amer: 57 mL/min — ABNORMAL LOW (ref >60)
Glucose, Bld: 276 mg/dL — ABNORMAL HIGH (ref 70–99)
Potassium: 3.9 mmol/L (ref 3.5–5.1)
Sodium: 152 mmol/L — ABNORMAL HIGH (ref 135–145)
Total Bilirubin: 0.7 mg/dL (ref 0.3–1.2)
Total Protein: 6.8 g/dL (ref 6.5–8.1)

## 2020-01-28 LAB — SARS CORONAVIRUS 2 (TAT 6-24 HRS): SARS Coronavirus 2: NEGATIVE

## 2020-01-28 MED ORDER — FREE WATER
300.0000 mL | Freq: Four times a day (QID) | Status: DC
  Administered 2020-01-29 – 2020-02-03 (×19): 300 mL

## 2020-01-28 MED ORDER — INSULIN GLARGINE 100 UNIT/ML ~~LOC~~ SOLN
20.0000 [IU] | Freq: Two times a day (BID) | SUBCUTANEOUS | Status: DC
  Administered 2020-01-28: 20 [IU] via SUBCUTANEOUS
  Filled 2020-01-28 (×2): qty 0.2

## 2020-01-28 MED ORDER — SODIUM CHLORIDE 0.45 % IV SOLN: INTRAVENOUS | Status: DC

## 2020-01-28 MED ORDER — INSULIN GLARGINE 100 UNIT/ML ~~LOC~~ SOLN
30.0000 [IU] | Freq: Two times a day (BID) | SUBCUTANEOUS | Status: DC
  Administered 2020-01-28: 30 [IU] via SUBCUTANEOUS
  Filled 2020-01-28 (×2): qty 0.3

## 2020-01-28 MED ORDER — INSULIN ASPART 100 UNIT/ML ~~LOC~~ SOLN
10.0000 [IU] | Freq: Once | SUBCUTANEOUS | Status: AC
  Administered 2020-01-28: 10 [IU] via INTRAVENOUS

## 2020-01-28 MED ORDER — PRO-STAT SUGAR FREE PO LIQD
30.0000 mL | Freq: Every day | ORAL | Status: DC
  Administered 2020-01-29 – 2020-02-03 (×6): 30 mL
  Filled 2020-01-28 (×6): qty 30

## 2020-01-28 MED ORDER — SODIUM CHLORIDE 0.9 % IV SOLN
3.0000 g | Freq: Four times a day (QID) | INTRAVENOUS | Status: DC
  Administered 2020-01-28 – 2020-01-29 (×4): 3 g via INTRAVENOUS
  Filled 2020-01-28 (×2): qty 3
  Filled 2020-01-28: qty 8
  Filled 2020-01-28 (×2): qty 3

## 2020-01-28 NOTE — Progress Notes (Signed)
Pharmacy Antibiotic Note  [name] is a 86 y.o. female admitted on 01/27/2020 with sepsis and concern for aspiration.  Pharmacy has been consulted to change antibiotics to Unasyn.  01/28/2020: Afebrile WBC increased to 21.3 Scr improved to 0.89; est CrCl ~ 94m/min  Plan: Unasyn 3gm IV q6h Monitor renal function and cx data   Height: 5' 7"  (170.2 cm) Weight: 74.9 kg (165 lb 2 oz) IBW/kg (Calculated) : 61.6  Temp (24hrs), Avg:99.2 F (37.3 C), Min:98.2 F (36.8 C), Max:100.4 F (38 C)  Recent Labs  Lab 01/27/20 1333 01/27/20 1453 01/28/20 0321  WBC  --  15.6* 21.3*  CREATININE  --  1.21* 0.89  LATICACIDVEN 1.8  --   --     Estimated Creatinine Clearance: 45.3 mL/min (by C-G formula based on SCr of 0.89 mg/dL).    No Known Allergies  Antimicrobials this admission: 4/9 vancomycin >> 4/10 4/9 cefepime >> 4/10 4/10 Unasyn>>  Dose adjustments this admission: n/a  Microbiology results: 4/9 BCx: sent 4/9 UCx: sent  4/9 MRSA PCR: ordered  Thank you for allowing pharmacy to be a part of this patient's care.  MNetta Cedars4/07/2020 9:06 AM

## 2020-01-28 NOTE — Progress Notes (Addendum)
PROGRESS NOTE    [name]  KHT:[id number] DOB: [birth year] DOA: 01/27/2020 PCP: [name], MD   Brief Narrative:  Patient is a 86 year old female with history of coronary artery disease status post CABG, complete heart block status post pacemaker, CVA with residual right-sided deficit, dysphagia, status post PEG placement, CKD stage IIIb, diabetes type 2 who presents from [facility] rehab for the concern of aspiration pneumonia. Patient is bedbound/wheelchair dependent on ambulation. On presentation she was febrile, tachypneic requiring oxygen, found to have leukocytosis, hyponatremia, hyperglycemia. CTA chest showed left lower opacity concerning for aspiration pneumonia.  Assessment & Plan:   Principal Problem:   Aspiration pneumonia ([place]) Active Problems:   Hypertension   Type 2 diabetes mellitus (HCC)   Heart block   Acute kidney failure, unspecified (HCC)   Stage 3b chronic kidney disease   Hypercalcemia   Hypernatremia   Dysphagia   Sepsis ([place])   Transaminitis   Suspected sepsis secondary to aspiration pneumonia/UTI: Aspiration pneumonia as per CT. Patient is n.p.o. and has a PEG. She was tachypneic, requiring oxygen supplementation for maintenance of optimal saturation on presentation. Started on IV vancomycin, cefepime on admission.Changed to Unasyn. Follow-up cultures.She has significant leukocytosis. We will monitor her on room air.  Hypernatremia: Sodium of 152 today. Continue hypotonic saline. Check BMP tomorrow  Diabetes type 2 with hyperglycemia: On insulin at home. Continue current regimen.  AKI on CKD status 3B: Improved with IV fluids. Currently kidney function stable  Chronic normocytic anemia: Stable.  Hypercalcemia: Resolved  Hypertension: Currently blood stable. Continue current medications  History of CVA: With residual right-sided deficits, dysphagia. Status post PEG placement. On aspirin, Plavix and statin which we will continue.  History  of coronary artery disease: Status post CABG. Status post pacemaker placement for complete heart block.         DVT prophylaxis: Heparin [place] Code Status: Full Family Communication: None present at the bedside Disposition Plan: Patient is from skilled nursing facility. Not stable for discharge, needing IV antibiotics for aspiration pneumonia. Still on supplemental oxygen. Discharge planning back  to skilled nursing facility when stable.   Consultants: None  Procedures: None  Antimicrobials:  Anti-infectives (From admission, onward)   Start     Dose/Rate Route Frequency Ordered Stop   01/28/20 2200  vancomycin (VANCOREADY) IVPB 750 mg/150 mL     750 mg 150 mL/hr over 60 Minutes Intravenous Every 24 hours 01/27/20 1926     01/27/20 2200  ceFEPIme (MAXIPIME) 2 g in sodium chloride 0.9 % 100 mL IVPB     2 g 200 mL/hr over 30 Minutes Intravenous Every 12 hours 01/27/20 1926     01/27/20 1445  vancomycin (VANCOREADY) IVPB 1750 mg/350 mL     1,750 mg 175 mL/hr over 120 Minutes Intravenous  Once 01/27/20 1419 01/27/20 2026   01/27/20 1415  vancomycin (VANCOCIN) IVPB 1000 mg/200 mL premix  Status:  Discontinued     1,000 mg 200 mL/hr over 60 Minutes Intravenous  Once 01/27/20 1401 01/27/20 1419   01/27/20 1415  ceFEPIme (MAXIPIME) 2 g in sodium chloride 0.9 % 100 mL IVPB     2 g 200 mL/hr over 30 Minutes Intravenous  Once 01/27/20 1401 01/27/20 1600      Subjective:  Patient seen and examined at the bedside this morning.  She was on supplemental oxygen, sleeping.  She does not look in any kind of respiratory distress.  Objective: Vitals:   01/27/20 2026 01/27/20 2242 01/28/20 0141 01/28/20 0500  BP: (!) 161/61  (!) 146/67 (!) 154/69  Pulse: 72  72 73  Resp: 17  16 16   Temp: 99.1 F (37.3 C)  98.9 F (37.2 C) 98.2 F (36.8 C)  TempSrc: Axillary   Axillary  SpO2: 100%  100% 100%  Weight:  74.9 kg    Height:  5' 7"  (1.702 m)      Intake/Output Summary (Last 24 hours) at  01/28/2020 0813 Last data filed at 01/28/2020 0600 Gross per 24 hour  Intake 1968.75 ml  Output 301 ml  Net 1667.75 ml   Filed Weights   01/27/20 2242  Weight: 74.9 kg    Examination:  General exam: Chronically ill  looking elderly female Respiratory system:no wheezes or crackles  Cardiovascular system: S1 & S2 heard, RRR. No JVD, murmurs, rubs, gallops or clicks. No pedal edema. Gastrointestinal system: Abdomen is nondistended, soft and nontender. No organomegaly or masses felt. Normal bowel sounds heard.PEG Central nervous system: Not alert or oriented, right hemiparesis Extremities: No edema, no clubbing ,no cyanosis Skin: No rashes, lesions or ulcers,no icterus ,no pallor    Data Reviewed: I have personally reviewed following labs and imaging studies  CBC: Recent Labs  Lab 01/27/20 1453 01/28/20 0321  WBC 15.6* 21.3*  NEUTROABS 12.1*  --   HGB 11.5* 10.1*  HCT 38.7 34.4*  MCV 95.8 94.8  PLT 214 638   Basic Metabolic Panel: Recent Labs  Lab 01/27/20 1453 01/28/20 0321  NA 150* 152*  K 4.6 3.9  CL 110 117*  CO2 29 27  GLUCOSE 424* 276*  BUN 43* 34*  CREATININE 1.21* 0.89  CALCIUM 11.1* 10.3   GFR: Estimated Creatinine Clearance: 45.3 mL/min (by C-G formula based on SCr of 0.89 mg/dL). Liver Function Tests: Recent Labs  Lab 01/27/20 1453 01/28/20 0321  AST 62* 28  ALT 78* 53*  ALKPHOS 89 77  BILITOT 0.5 0.7  PROT 7.8 6.8  ALBUMIN 3.4* 3.0*   No results for input(s): LIPASE, AMYLASE in the last 168 hours. Recent Labs  Lab 01/27/20 1817  AMMONIA 22   Coagulation Profile: Recent Labs  Lab 01/27/20 1454  INR 1.1   Cardiac Enzymes: No results for input(s): CKTOTAL, CKMB, CKMBINDEX, TROPONINI in the last 168 hours. BNP (last 3 results) No results for input(s): PROBNP in the last 8760 hours. HbA1C: No results for input(s): HGBA1C in the last 72 hours. CBG: Recent Labs  Lab 01/27/20 1346 01/27/20 2039 01/28/20 0741  GLUCAP 416* 233*  353*   Lipid Profile: No results for input(s): CHOL, HDL, LDLCALC, TRIG, CHOLHDL, LDLDIRECT in the last 72 hours. Thyroid Function Tests: No results for input(s): TSH, T4TOTAL, FREET4, T3FREE, THYROIDAB in the last 72 hours. Anemia Panel: No results for input(s): VITAMINB12, FOLATE, FERRITIN, TIBC, IRON, RETICCTPCT in the last 72 hours. Sepsis Labs: Recent Labs  Lab 01/27/20 1333  LATICACIDVEN 1.8    Recent Results (from the past 240 hour(s))  SARS CORONAVIRUS 2 (TAT 6-24 HRS) Nasopharyngeal Nasopharyngeal Swab     Status: None   Collection Time: 01/27/20  6:10 PM   Specimen: Nasopharyngeal Swab  Result Value Ref Range Status   SARS Coronavirus 2 NEGATIVE NEGATIVE Final    Comment: (NOTE) SARS-CoV-2 target nucleic acids are NOT DETECTED. The SARS-CoV-2 RNA is generally detectable in upper and lower respiratory specimens during the acute phase of infection. Negative results do not preclude SARS-CoV-2 infection, do not rule out co-infections with other pathogens, and should not be used as the sole basis for  treatment or other patient management decisions. Negative results must be combined with clinical observations, patient history, and epidemiological information. The expected result is Negative. Fact Sheet for Patients: SugarRoll.be Fact Sheet for Healthcare Providers: https://www.woods-mathews.com/ This test is not yet approved or cleared by the [place] FDA and  has been authorized for detection and/or diagnosis of SARS-CoV-2 by FDA under an Emergency Use Authorization (EUA). This EUA will remain  in effect (meaning this test can be used) for the duration of the COVID-19 declaration under Section 56 4(b)(1) of the Act, 21 U.S.C. section 360bbb-3(b)(1), unless the authorization is terminated or revoked sooner. Performed at [facility], [place] [address] [place],  [address]          Radiology Studies: CT Head  Wo Contrast  Result Date: 01/27/2020 CLINICAL DATA:  Encephalopathy. Insulin-dependent diabetes, CHF. Altered mental status. Aspiration 2 nights ago. EXAM: CT HEAD WITHOUT CONTRAST TECHNIQUE: Contiguous axial images were obtained from the base of the skull through the vertex without intravenous contrast. COMPARISON:  12/15/2019 FINDINGS: Brain: There is central and cortical atrophy. Periventricular white matter changes are consistent with small vessel disease. There is no intra or extra-axial fluid collection or mass lesion. The basilar cisterns and ventricles have a normal appearance. There is no CT evidence for acute infarction or hemorrhage. Vascular: Atherosclerotic calcification of the internal carotid arteries. No hyperdense vessels. Skull: Normal. Negative for fracture or focal lesion. Sinuses/Orbits: There is minimal mucoperiosteal thickening of the maxillary sinuses bilaterally. No air-fluid levels. Other: None. IMPRESSION: 1. No evidence for acute intracranial abnormality. 2. Atrophy and small vessel disease. 3. Minimal chronic sinusitis. Electronically Signed   By: Nolon Nations M.D.   On: 01/27/2020 16:21   CT Angio Chest PE W/Cm &/[place] Wo Cm  Result Date: 01/27/2020 CLINICAL DATA:  Altered mental status for 3 days. Possible aspiration. Fever. EXAM: CT ANGIOGRAPHY CHEST WITH CONTRAST TECHNIQUE: Multidetector CT imaging of the chest was performed using the standard protocol during bolus administration of intravenous contrast. Multiplanar CT image reconstructions and MIPs were obtained to evaluate the vascular anatomy. CONTRAST:  61m OMNIPAQUE IOHEXOL 350 MG/ML SOLN COMPARISON:  Chest radiographs 01/27/2020 and 12/15/2019. Abdominal CT 12/28/2019. CT cervical spine 10/10/2019 and thyroid ultrasound 08/24/2013. FINDINGS: Cardiovascular: The pulmonary arteries are well opacified with contrast to the level of the subsegmental branches. There is no evidence of acute pulmonary embolism. Mild central  enlargement of the pulmonary arteries. Atherosclerosis of the aorta, great vessels and coronary arteries status post median sternotomy and CABG. AICD leads extend into the right atrium, right ventricle and coronary sinus. The heart size is normal. There is no pericardial effusion. Mediastinum/Nodes: There are no enlarged mediastinal, hilar or axillary lymph nodes.Previously biopsied large right thyroid nodule measures up to 4.1 x 3.7 cm on image 8/4, similar to previous studies. There is mild mass effect on the trachea. The esophagus demonstrates no significant findings. Lungs/Pleura: No pleural effusion or pneumothorax. Mild centrilobular emphysema. There is airway thickening and partial opacification the left lower lobe bronchus. There is left lower lobe volume loss and patchy airspace disease consistent with aspiration. Within the aerated portions of the left lower lobe, there are ill-defined nodular densities which are likely inflammatory. Upper abdomen: No significant findings within the visualized upper abdomen. Musculoskeletal/Chest wall: There is no chest wall mass or suspicious osseous finding. Review of the MIP images confirms the above findings. IMPRESSION: 1. No evidence of acute pulmonary embolism or other acute vascular findings. 2. Left lower lobe volume  loss and patchy airspace disease consistent with aspiration pneumonia. Ill-defined nodular densities in the aerated portions of the left lower lobe are likely inflammatory. Radiographic follow up recommended. 3. Stable large right thyroid nodule, previously biopsied. This has been documented on prior studies (ref: J Am Coll Radiol. 2015 Feb;12(2): 143-50). 4. Aortic Atherosclerosis (ICD10-I70.0). Mild central enlargement of the pulmonary arteries suggesting pulmonary arterial hypertension. Electronically Signed   By: Richardean Sale M.D.   On: 01/27/2020 17:58   DG Chest Port 1 View  Result Date: 01/27/2020 CLINICAL DATA:  Altered mental status for  3 days. Fever. EXAM: PORTABLE CHEST 1 VIEW COMPARISON:  Radiographs 12/15/2019 and 12/11/2019. Abdominal CT 12/28/2019. FINDINGS: 1417 hours. Left subclavian AICD with leads in the right atrium, right ventricle and coronary sinus. Stable heart size and mediastinal contours post median sternotomy and CABG. There is mild aortic atherosclerosis. The lungs are clear. There is no pleural effusion or pneumothorax. No acute osseous findings. Telemetry leads overlie the chest. IMPRESSION: Stable chest. No active cardiopulmonary process. Electronically Signed   By: Richardean Sale M.D.   On: 01/27/2020 14:31        Scheduled Meds: . amLODipine  5 mg Per Tube Daily  . aspirin  81 mg Per J Tube Daily  . atorvastatin  80 mg Per Tube q morning - 10a  . carvedilol  3.125 mg Per Tube BID  . clopidogrel  75 mg Per Tube Daily  . donepezil  10 mg Per Tube QHS  . feeding supplement (JEVITY 1.5 CAL/FIBER)  237 mL Per Tube 5 X Daily  . ferrous sulfate  300 mg Per Tube Q breakfast  . free water  200 mL Per Tube TID  . heparin  5,000 Units Subcutaneous Q8H  . insulin aspart  0-15 Units Subcutaneous TID WC  . insulin glargine  10 Units Subcutaneous BID  . pantoprazole sodium  40 mg Per Tube QAC breakfast  . polyethylene glycol  17 g Per Tube BID  . scopolamine  1 patch Transdermal Q72H  . senna-docusate  1 tablet Per Tube BID   Continuous Infusions: . sodium chloride    . sodium chloride 75 mL/hr at 01/27/20 2034  . ceFEPime (MAXIPIME) IV Stopped (01/27/20 2247)  . vancomycin       LOS: 1 day    Time spent:35 mins, More than 50% of that time was spent in counseling and/or coordination of care.      Shelly Coss, MD [facility] P4/07/2020, 8:13 AM

## 2020-01-28 NOTE — Plan of Care (Signed)
  Problem: Clinical Measurements: Goal: Cardiovascular complication will be avoided Outcome: Progressing   Problem: Safety: Goal: Ability to remain free from injury will improve Outcome: Progressing

## 2020-01-28 NOTE — Progress Notes (Signed)
Patient had a yellow MEWS this morning due to LOC not being complete.  I thought I had fixed it the first time, but did not realize that it had not changed over.

## 2020-01-28 NOTE — Progress Notes (Signed)
CRITICAL VALUE ALERT  Critical Value:  CBG 424  Date & Time Notied:  04/10, 1649  Provider Notified: A. Adhikari notified at 1649   Orders Received/Actions taken: 10 units IV insulin ordered by attending MD.   Hulen Skains ICU RN for assistance.   Asked attending provider about checking a BMET secondary to RRRN recommendation, and precaution that IV insulin has the potential to cause hypokalemia.   Adhikari did not want a BMET ordered after administration of IV insulin.   Sundra Aland, RN

## 2020-01-28 NOTE — Progress Notes (Signed)
Physical Therapy Evaluation Patient Details Name: [name] MRN: [id number] DOB: 1928-05-31 Today's Date: 01/28/2020   History of Present Illness  Pt is a 86 y.o. F with significant PMH of CHF, DM, CAD with pacemaker, HTN, prior stroke, who presents with sepsis 2* aspiration pna.    Clinical Impression  Pt admitted as above and presenting with functional mobility limitations 2* generalized weakness and residual hemiparesis from prior CVA, balance deficits and ltd endurance.  Per chart pt largely bed/w/c bound at facility but this date pt is able to balance at EOB x 8 min with sup/min guard assist before fatigueing       Follow Up Recommendations SNF    Equipment Recommendations  None recommended by PT    Recommendations for Other Services       Precautions / Restrictions Precautions Precautions: Fall;Other (comment);ICD/Pacemaker Precaution Comments: Pt aphasic 2* CVA in the past Restrictions Weight Bearing Restrictions: No      Mobility  Bed Mobility Overal bed mobility: Needs Assistance Bed Mobility: Supine to Sit;Sit to Supine     Supine to sit: Total assist;+2 for physical assistance;+2 for safety/equipment Sit to supine: Total assist;+2 for physical assistance;+2 for safety/equipment   General bed mobility comments: Pt assisting minimally with bed mobility but able to hold balance in EOB sitting x 8 minutes with min guard/sup  Transfers                 General transfer comment: NT 2* pt ltd ability to participate and increasing fatigue  Ambulation/Gait                Stairs            Wheelchair Mobility    Modified Rankin (Stroke Patients Only)       Balance Overall balance assessment: Needs assistance Sitting-balance support: Feet supported;Single extremity supported Sitting balance-Leahy Scale: Poor                                       Pertinent Vitals/Pain      Home Living Family/patient expects to  be discharged to:: Skilled nursing facility                      Prior Function Level of Independence: Needs assistance         Comments: Pt unable to provide information - per chart, pt is largely wheelchair/bed bound at SNF     Hand Dominance   Dominant Hand: Right    Extremity/Trunk Assessment   Upper Extremity Assessment Upper Extremity Assessment: RUE deficits/detail;Difficult to assess due to impaired cognition;Generalized weakness RUE Deficits / Details: No noted voluntary movement R UE    Lower Extremity Assessment Lower Extremity Assessment: Generalized weakness;RLE deficits/detail;Difficult to assess due to impaired cognition RLE Deficits / Details: No voluntary movement noted R LE; R foot in PRAFO     Cervical / Trunk Assessment Cervical / Trunk Assessment: Kyphotic  Communication   Communication: HOH;Expressive difficulties  Cognition Arousal/Alertness: Lethargic Behavior During Therapy: Flat affect Overall Cognitive Status: History of cognitive impairments - at baseline                                        General Comments      Exercises     Assessment/Plan  PT Assessment Patient needs continued PT services  PT Problem List Decreased strength;Decreased range of motion;Decreased activity tolerance;Decreased balance;Decreased mobility;Decreased cognition;Decreased knowledge of use of DME;Obesity       PT Treatment Interventions DME instruction;Functional mobility training;Therapeutic activities;Therapeutic exercise;Balance training;Patient/family education    PT Goals (Current goals can be found in the Care Plan section)  Acute Rehab PT Goals Patient Stated Goal: Pt unable but indicates yes when asked if she would like to move PT Goal Formulation: Patient unable to participate in goal setting Time For Goal Achievement: 02/11/20 Potential to Achieve Goals: Fair    Frequency Min 2X/week   Barriers to discharge         Co-evaluation               AM-PAC PT "6 Clicks" Mobility  Outcome Measure Help needed turning from your back to your side while in a flat bed without using bedrails?: Total Help needed moving from lying on your back to sitting on the side of a flat bed without using bedrails?: Total Help needed moving to and from a bed to a chair (including a wheelchair)?: Total Help needed standing up from a chair using your arms (e.g., wheelchair or bedside chair)?: Total Help needed to walk in hospital room?: Total Help needed climbing 3-5 steps with a railing? : Total 6 Click Score: 6    End of Session   Activity Tolerance: Patient limited by fatigue Patient left: in bed;with call bell/phone within reach;with bed alarm set Nurse Communication: Mobility status PT Visit Diagnosis: Muscle weakness (generalized) (M62.81);Hemiplegia and hemiparesis;Difficulty in walking, not elsewhere classified (R26.2) Hemiplegia - Right/Left: Right Hemiplegia - dominant/non-dominant: Dominant Hemiplegia - caused by: Cerebral infarction    Time: 7782-4235 PT Time Calculation (min) (ACUTE ONLY): 22 min   Charges:   PT Evaluation $PT Eval Moderate Complexity: 1 Mod          [place] Pager [phone] Office [phone]   Lui Bellis 01/28/2020, 1:08 PM

## 2020-01-28 NOTE — Progress Notes (Signed)
Initial Nutrition Assessment  DOCUMENTATION CODES:   Not applicable  INTERVENTION:   Tube feeding:  -Jevity 1.5 formula (1 can/ARC) 5 times daily.  -30 ml Prostat once daily -Increase free water flushes of 300 ml Q6 hours   Tube feeding regimen provides 1878 kcal, 91 grams of protein, and 900 ml water (2100 ml with flushes)  NUTRITION DIAGNOSIS:   Inadequate oral intake related to inability to eat, dysphagia as evidenced by NPO status.  GOAL:   Patient will meet greater than or equal to 90% of their needs  MONITOR:   Skin, TF tolerance, Weight trends, Labs, I & O's  REASON FOR ASSESSMENT:   Consult Enteral/tube feeding initiation and management  ASSESSMENT:   Patient with PMH significant for CAD s/p CABG, complete heart block s/p pacemaker, CVA with residual right sided deficit and dysphagia, PEG tube dependent, CKD, and DM. Presents this admission with aspiration PNA and questionable UTI.   RD working remotely.  Attempted to call room, no answer. Spoke with RN who reports pt is tolerating at home bolus schedule. Has had two boluses without any signs of intolerance. CBGs noted to be elevated. MD made aware. Hypernatremia persist. Increase free water flushes.   Weight shows to decline from 82.9 kg on 3/23 to 74.9 kg this admission (9.7% wt loss in three weeks, significant for time frame). Unsure if this is actual dry weight loss or fluid fluctuation. Will need to obtain history and NFPE to assess for malnutrition.   Drips: 1/2 NS @ 75 ml/hr  Medications: ferrous sulfate, SS novolog, lantus, miralax, senokot Labs: Na 152 (H) CBG [phone]   Diet Order:   Diet Order            Diet NPO time specified  Diet effective now              EDUCATION NEEDS:   Not appropriate for education at this time  Skin:  Skin Assessment: Skin Integrity Issues: Skin Integrity Issues:: DTI DTI: R heel  Last BM:  PTA  Height:   Ht Readings from Last 1 Encounters:  01/27/20  5' 7"  (1.702 m)    Weight:   Wt Readings from Last 1 Encounters:  01/27/20 74.9 kg    BMI:  Body mass index is 25.86 kg/m.  Estimated Nutritional Needs:   Kcal:  1700-1900 kcal  Protein:  85-100 grams  Fluid:  >/= 1.7 L/day   Mariana Single RD, LDN Clinical Nutrition Pager listed in [place]

## 2020-01-28 NOTE — Plan of Care (Signed)
  Problem: Clinical Measurements: Goal: Diagnostic test results will improve Outcome: Progressing   Problem: Nutrition: Goal: Adequate nutrition will be maintained Outcome: Progressing   Problem: Pain Managment: Goal: General experience of comfort will improve Outcome: Progressing

## 2020-01-29 DIAGNOSIS — J690 Pneumonitis due to inhalation of food and vomit: Secondary | ICD-10-CM | POA: Diagnosis not present

## 2020-01-29 LAB — CBC WITH DIFFERENTIAL/PLATELET
Abs Immature Granulocytes: 0.07 10*3/uL (ref 0.00–0.07)
Basophils Absolute: 0 10*3/uL (ref 0.0–0.1)
Basophils Relative: 0 %
Eosinophils Absolute: 0.1 10*3/uL (ref 0.0–0.5)
Eosinophils Relative: 1 %
HCT: 35.1 % — ABNORMAL LOW (ref 36.0–46.0)
Hemoglobin: 10.3 g/dL — ABNORMAL LOW (ref 12.0–15.0)
Immature Granulocytes: 0 %
Lymphocytes Relative: 15 %
Lymphs Abs: 2.3 10*3/uL (ref 0.7–4.0)
MCH: 28.4 pg (ref 26.0–34.0)
MCHC: 29.3 g/dL — ABNORMAL LOW (ref 30.0–36.0)
MCV: 96.7 fL (ref 80.0–100.0)
Monocytes Absolute: 1 10*3/uL (ref 0.1–1.0)
Monocytes Relative: 6 %
Neutro Abs: 12.2 10*3/uL — ABNORMAL HIGH (ref 1.7–7.7)
Neutrophils Relative %: 78 %
Platelets: 243 10*3/uL (ref 150–400)
RBC: 3.63 MIL/uL — ABNORMAL LOW (ref 3.87–5.11)
RDW: 13.2 % (ref 11.5–15.5)
WBC: 15.8 10*3/uL — ABNORMAL HIGH (ref 4.0–10.5)
nRBC: 0 % (ref 0.0–0.2)

## 2020-01-29 LAB — GLUCOSE, CAPILLARY
Glucose-Capillary: 345 mg/dL — ABNORMAL HIGH (ref 70–99)
Glucose-Capillary: 355 mg/dL — ABNORMAL HIGH (ref 70–99)
Glucose-Capillary: 344 mg/dL — ABNORMAL HIGH (ref 70–99)
Glucose-Capillary: 337 mg/dL — ABNORMAL HIGH (ref 70–99)
Glucose-Capillary: 340 mg/dL — ABNORMAL HIGH (ref 70–99)
Glucose-Capillary: 290 mg/dL — ABNORMAL HIGH (ref 70–99)

## 2020-01-29 LAB — BASIC METABOLIC PANEL
Anion gap: 8 (ref 5–15)
BUN: 27 mg/dL — ABNORMAL HIGH (ref 8–23)
CO2: 25 mmol/L (ref 22–32)
Calcium: 9.9 mg/dL (ref 8.9–10.3)
Chloride: 116 mmol/L — ABNORMAL HIGH (ref 98–111)
Creatinine, Ser: 0.87 mg/dL (ref 0.44–1.00)
GFR calc Af Amer: >60 mL/min (ref >60)
GFR calc non Af Amer: 59 mL/min — ABNORMAL LOW (ref >60)
Glucose, Bld: 401 mg/dL — ABNORMAL HIGH (ref 70–99)
Potassium: 3.6 mmol/L (ref 3.5–5.1)
Sodium: 149 mmol/L — ABNORMAL HIGH (ref 135–145)

## 2020-01-29 MED ORDER — INSULIN GLARGINE 100 UNIT/ML ~~LOC~~ SOLN
35.0000 [IU] | Freq: Two times a day (BID) | SUBCUTANEOUS | Status: DC
  Administered 2020-01-29 – 2020-01-30 (×3): 35 [IU] via SUBCUTANEOUS
  Filled 2020-01-29 (×4): qty 0.35

## 2020-01-29 MED ORDER — SODIUM CHLORIDE 0.9 % IV SOLN
500.0000 mg | INTRAVENOUS | Status: DC
  Administered 2020-01-29 – 2020-01-30 (×2): 500 mg via INTRAVENOUS
  Filled 2020-01-29 (×2): qty 500

## 2020-01-29 MED ORDER — INSULIN ASPART 100 UNIT/ML ~~LOC~~ SOLN
10.0000 [IU] | Freq: Three times a day (TID) | SUBCUTANEOUS | Status: DC
  Administered 2020-01-29 – 2020-02-03 (×16): 10 [IU] via SUBCUTANEOUS

## 2020-01-29 MED ORDER — SODIUM CHLORIDE 0.9 % IV SOLN
1.0000 g | INTRAVENOUS | Status: DC
  Administered 2020-01-29 – 2020-01-30 (×2): 1 g via INTRAVENOUS
  Filled 2020-01-29: qty 1
  Filled 2020-01-29: qty 10

## 2020-01-29 MED ORDER — LIP MEDEX EX OINT
TOPICAL_OINTMENT | CUTANEOUS | Status: AC
  Filled 2020-01-29: qty 7

## 2020-01-29 MED ORDER — AMLODIPINE BESYLATE 10 MG PO TABS
10.0000 mg | ORAL_TABLET | Freq: Every day | ORAL | Status: DC
  Administered 2020-01-30 – 2020-02-03 (×5): 10 mg
  Filled 2020-01-29 (×5): qty 1

## 2020-01-29 MED ORDER — INSULIN ASPART 100 UNIT/ML ~~LOC~~ SOLN
15.0000 [IU] | Freq: Once | SUBCUTANEOUS | Status: AC
  Administered 2020-01-29: 15 [IU] via SUBCUTANEOUS

## 2020-01-29 MED ORDER — SODIUM CHLORIDE 0.45 % IV SOLN: INTRAVENOUS | Status: DC

## 2020-01-29 NOTE — Plan of Care (Signed)

## 2020-01-29 NOTE — Progress Notes (Addendum)
PROGRESS NOTE    [name]  XTA:[id number] DOB: 1928/07/11 DOA: 01/27/2020 PCP: [name], MD   Brief Narrative:  Patient is a 86 year old female with history of coronary artery disease status post CABG, complete heart block status post pacemaker, CVA with residual right-sided deficit, dysphagia, status post PEG placement, CKD stage IIIb, diabetes type 2 who presents from [facility] rehab for the concern of aspiration pneumonia. Patient is bedbound/wheelchair dependent on ambulation. On presentation she was febrile, tachypneic requiring oxygen, found to have leukocytosis, hyponatremia, hyperglycemia. CTA chest showed left lower opacity concerning for aspiration pneumonia.Also found to have urinary tract infection. Anticipate discharge back to the facility tomorrow.  Assessment & Plan:   Principal Problem:   Aspiration pneumonia ([place]) Active Problems:   Hypertension   Type 2 diabetes mellitus (HCC)   Heart block   Acute kidney failure, unspecified (HCC)   Stage 3b chronic kidney disease   Hypercalcemia   Hypernatremia   Dysphagia   Sepsis ([place])   Transaminitis   Suspected sepsis secondary to aspiration pneumonia/UTI: Aspiration pneumonia as per CT. Patient is n.p.o. and has a PEG. She was tachypneic, requiring oxygen supplementation for maintenance of optimal saturation on presentation. Started on IV vancomycin, cefepime on admission.Changed to ceftriaxone ,azithromycin.  Follow-up cultures.She has significant leukocytosis.  Urine culture showing Ecoli, We will follow-up final culture report Currently on room air.  Hypernatremia: Sodium of 149 today. Continue hypotonic saline. Check BMP tomorrow  Diabetes type 2 with hyperglycemia: Looks uncontrolled.  Follow-up hemoglobin A1c . Takes  insulin . Continue current regimen with Lantus, NovoLog, sliding scale.  I have discussed with RN about decreasing the volume of her  tube feeding.  AKI on CKD status 3B: Improved with IV  fluids. Currently kidney function stable  Chronic normocytic anemia: Stable.  Hypercalcemia: Resolved  Hypertension: Currently blood stable. Continue current medications  History of CVA: With residual right-sided deficits, dysphagia. Status post PEG placement. On aspirin, Plavix and statin which we will continue.  History of coronary artery disease: Status post CABG. Status post pacemaker placement for complete heart block.  Nutrition Problem: Inadequate oral intake Etiology: inability to eat, dysphagia      DVT prophylaxis: Heparin [place] Code Status: Full Family Communication: Discussed with daughter on phone on 01/29/2020  Status is: Inpatient  Remains inpatient appropriate because:Persistent severe electrolyte disturbances   Dispo: The patient is from: SNF              Anticipated d/c is to: SNF              Anticipated d/c date is: 1 day              Patient currently is not medically stable to d/c.          Consultants: None  Procedures: None  Antimicrobials:  Anti-infectives (From admission, onward)   Start     Dose/Rate Route Frequency Ordered Stop   01/28/20 2200  vancomycin (VANCOREADY) IVPB 750 mg/150 mL  Status:  Discontinued     750 mg 150 mL/hr over 60 Minutes Intravenous Every 24 hours 01/27/20 1926 01/28/20 0821   01/28/20 1000  Ampicillin-Sulbactam (UNASYN) 3 g in sodium chloride 0.9 % 100 mL IVPB     3 g 200 mL/hr over 30 Minutes Intravenous Every 6 hours 01/28/20 0909     01/27/20 2200  ceFEPIme (MAXIPIME) 2 g in sodium chloride 0.9 % 100 mL IVPB  Status:  Discontinued     2 g 200 mL/hr  over 30 Minutes Intravenous Every 12 hours 01/27/20 1926 01/28/20 0821   01/27/20 1445  vancomycin (VANCOREADY) IVPB 1750 mg/350 mL     1,750 mg 175 mL/hr over 120 Minutes Intravenous  Once 01/27/20 1419 01/27/20 2026   01/27/20 1415  vancomycin (VANCOCIN) IVPB 1000 mg/200 mL premix  Status:  Discontinued     1,000 mg 200 mL/hr over 60 Minutes Intravenous   Once 01/27/20 1401 01/27/20 1419   01/27/20 1415  ceFEPIme (MAXIPIME) 2 g in sodium chloride 0.9 % 100 mL IVPB     2 g 200 mL/hr over 30 Minutes Intravenous  Once 01/27/20 1401 01/27/20 1600      Subjective:  Patient seen and examined at the bedside this morning.  Hemodynamically stable.  Currently on room air.  Looks comfortable.  More alert and awake today.  Denies any shortness of breath or abdominal pain.  Objective: Vitals:   01/28/20 1031 01/28/20 1327 01/28/20 2103 01/29/20 0414  BP: (!) 151/71 (!) 136/56 (!) 184/67 (!) 188/79  Pulse: 74 70 74 73  Resp: 16 16 18 20   Temp: 98.2 F (36.8 C) 98.2 F (36.8 C) 99.4 F (37.4 C) 99 F (37.2 C)  TempSrc: Oral Oral Oral Oral  SpO2: 100% 99% 96% 99%  Weight:      Height:        Intake/Output Summary (Last 24 hours) at 01/29/2020 0830 Last data filed at 01/29/2020 0865 Gross per 24 hour  Intake 1774.24 ml  Output 1200 ml  Net 574.24 ml   Filed Weights   01/27/20 2242  Weight: 74.9 kg    Examination:  General exam: Chronically ill looking, elderly, bedbound Respiratory system: Bilateral equal air entry, normal vesicular breath sounds, no wheezes or crackles  Cardiovascular system: S1 & S2 heard, RRR. No JVD, murmurs, rubs, gallops or clicks. Gastrointestinal system: Abdomen is nondistended, soft and nontender. No organomegaly or masses felt. Normal bowel sounds heard.PEG Central nervous system: Alert and awake but not oriented, right hemiparesis extremities: No edema, no clubbing ,no cyanosis Skin: No rashes, lesions or ulcers,no icterus ,no pallor    Data Reviewed: I have personally reviewed following labs and imaging studies  CBC: Recent Labs  Lab 01/27/20 1453 01/28/20 0321 01/29/20 0325  WBC 15.6* 21.3* 15.8*  NEUTROABS 12.1*  --  12.2*  HGB 11.5* 10.1* 10.3*  HCT 38.7 34.4* 35.1*  MCV 95.8 94.8 96.7  PLT 214 251 784   Basic Metabolic Panel: Recent Labs  Lab 01/27/20 1453 01/28/20 0321  01/29/20 0325  NA 150* 152* 149*  K 4.6 3.9 3.6  CL 110 117* 116*  CO2 29 27 25   GLUCOSE 424* 276* 401*  BUN 43* 34* 27*  CREATININE 1.21* 0.89 0.87  CALCIUM 11.1* 10.3 9.9   GFR: Estimated Creatinine Clearance: 46.3 mL/min (by C-G formula based on SCr of 0.87 mg/dL). Liver Function Tests: Recent Labs  Lab 01/27/20 1453 01/28/20 0321  AST 62* 28  ALT 78* 53*  ALKPHOS 89 77  BILITOT 0.5 0.7  PROT 7.8 6.8  ALBUMIN 3.4* 3.0*   No results for input(s): LIPASE, AMYLASE in the last 168 hours. Recent Labs  Lab 01/27/20 1817  AMMONIA 22   Coagulation Profile: Recent Labs  Lab 01/27/20 1454  INR 1.1   Cardiac Enzymes: No results for input(s): CKTOTAL, CKMB, CKMBINDEX, TROPONINI in the last 168 hours. BNP (last 3 results) No results for input(s): PROBNP in the last 8760 hours. HbA1C: No results for input(s): HGBA1C in the last  72 hours. CBG: Recent Labs  Lab 01/28/20 1859 01/28/20 1954 01/28/20 2344 01/29/20 0410 01/29/20 0725  GLUCAP 298* 304* 318* 345* 355*   Lipid Profile: No results for input(s): CHOL, HDL, LDLCALC, TRIG, CHOLHDL, LDLDIRECT in the last 72 hours. Thyroid Function Tests: No results for input(s): TSH, T4TOTAL, FREET4, T3FREE, THYROIDAB in the last 72 hours. Anemia Panel: No results for input(s): VITAMINB12, FOLATE, FERRITIN, TIBC, IRON, RETICCTPCT in the last 72 hours. Sepsis Labs: Recent Labs  Lab 01/27/20 1333  LATICACIDVEN 1.8    Recent Results (from the past 240 hour(s))  Blood Culture (routine x 2)     Status: None (Preliminary result)   Collection Time: 01/27/20  2:53 PM   Specimen: BLOOD  Result Value Ref Range Status   Specimen Description   Final    BLOOD RIGHT ANTECUBITAL Performed at [facility] [address] [place], [place] [address]    Special Requests   Final    BOTTLES DRAWN AEROBIC AND ANAEROBIC Blood Culture adequate volume Performed at [facility] [address],  [place], [place] [address]    Culture   Final    NO GROWTH < 24 HOURS Performed at [facility] [address] [place], [place] [address]    Report Status PENDING  Incomplete  SARS CORONAVIRUS 2 (TAT 6-24 HRS) Nasopharyngeal Nasopharyngeal Swab     Status: None   Collection Time: 01/27/20  6:10 PM   Specimen: Nasopharyngeal Swab  Result Value Ref Range Status   SARS Coronavirus 2 NEGATIVE NEGATIVE Final    Comment: (NOTE) SARS-CoV-2 target nucleic acids are NOT DETECTED. The SARS-CoV-2 RNA is generally detectable in upper and lower respiratory specimens during the acute phase of infection. Negative results do not preclude SARS-CoV-2 infection, do not rule out co-infections with other pathogens, and should not be used as the sole basis for treatment or other patient management decisions. Negative results must be combined with clinical observations, patient history, and epidemiological information. The expected result is Negative. Fact Sheet for Patients: SugarRoll.be Fact Sheet for Healthcare Providers: https://www.woods-mathews.com/ This test is not yet approved or cleared by the [place] FDA and  has been authorized for detection and/or diagnosis of SARS-CoV-2 by FDA under an Emergency Use Authorization (EUA). This EUA will remain  in effect (meaning this test can be used) for the duration of the COVID-19 declaration under Section 56 4(b)(1) of the Act, 21 U.S.C. section 360bbb-3(b)(1), unless the authorization is terminated or revoked sooner. Performed at [facility], [place] [address] [place], [place] [address]   MRSA PCR Screening     Status: None   Collection Time: 01/27/20  7:29 PM   Specimen: Nasal Mucosa; Nasopharyngeal  Result Value Ref Range Status   MRSA by PCR NEGATIVE NEGATIVE Final    Comment:        The GeneXpert MRSA Assay (FDA approved for NASAL specimens only), is one component of a comprehensive MRSA  colonization surveillance program. It is not intended to diagnose MRSA infection nor to guide or monitor treatment for MRSA infections. Performed at [facility], [place] [address], [place], [place] [address]          Radiology Studies: CT Head Wo Contrast  Result Date: 01/27/2020 CLINICAL DATA:  Encephalopathy. Insulin-dependent diabetes, CHF. Altered mental status. Aspiration 2 nights ago. EXAM: CT HEAD WITHOUT CONTRAST TECHNIQUE: Contiguous axial images were obtained from the base of the skull through the vertex without intravenous contrast.  COMPARISON:  12/15/2019 FINDINGS: Brain: There is central and cortical atrophy. Periventricular white matter changes are consistent with small vessel disease. There is no intra or extra-axial fluid collection or mass lesion. The basilar cisterns and ventricles have a normal appearance. There is no CT evidence for acute infarction or hemorrhage. Vascular: Atherosclerotic calcification of the internal carotid arteries. No hyperdense vessels. Skull: Normal. Negative for fracture or focal lesion. Sinuses/Orbits: There is minimal mucoperiosteal thickening of the maxillary sinuses bilaterally. No air-fluid levels. Other: None. IMPRESSION: 1. No evidence for acute intracranial abnormality. 2. Atrophy and small vessel disease. 3. Minimal chronic sinusitis. Electronically Signed   By: Nolon Nations M.D.   On: 01/27/2020 16:21   CT Angio Chest PE W/Cm &/Or Wo Cm  Result Date: 01/27/2020 CLINICAL DATA:  Altered mental status for 3 days. Possible aspiration. Fever. EXAM: CT ANGIOGRAPHY CHEST WITH CONTRAST TECHNIQUE: Multidetector CT imaging of the chest was performed using the standard protocol during bolus administration of intravenous contrast. Multiplanar CT image reconstructions and MIPs were obtained to evaluate the vascular anatomy. CONTRAST:  66m OMNIPAQUE IOHEXOL 350 MG/ML SOLN COMPARISON:  Chest radiographs 01/27/2020 and 12/15/2019.  Abdominal CT 12/28/2019. CT cervical spine 10/10/2019 and thyroid ultrasound 08/24/2013. FINDINGS: Cardiovascular: The pulmonary arteries are well opacified with contrast to the level of the subsegmental branches. There is no evidence of acute pulmonary embolism. Mild central enlargement of the pulmonary arteries. Atherosclerosis of the aorta, great vessels and coronary arteries status post median sternotomy and CABG. AICD leads extend into the right atrium, right ventricle and coronary sinus. The heart size is normal. There is no pericardial effusion. Mediastinum/Nodes: There are no enlarged mediastinal, hilar or axillary lymph nodes.Previously biopsied large right thyroid nodule measures up to 4.1 x 3.7 cm on image 8/4, similar to previous studies. There is mild mass effect on the trachea. The esophagus demonstrates no significant findings. Lungs/Pleura: No pleural effusion or pneumothorax. Mild centrilobular emphysema. There is airway thickening and partial opacification the left lower lobe bronchus. There is left lower lobe volume loss and patchy airspace disease consistent with aspiration. Within the aerated portions of the left lower lobe, there are ill-defined nodular densities which are likely inflammatory. Upper abdomen: No significant findings within the visualized upper abdomen. Musculoskeletal/Chest wall: There is no chest wall mass or suspicious osseous finding. Review of the MIP images confirms the above findings. IMPRESSION: 1. No evidence of acute pulmonary embolism or other acute vascular findings. 2. Left lower lobe volume loss and patchy airspace disease consistent with aspiration pneumonia. Ill-defined nodular densities in the aerated portions of the left lower lobe are likely inflammatory. Radiographic follow up recommended. 3. Stable large right thyroid nodule, previously biopsied. This has been documented on prior studies (ref: J Am Coll Radiol. 2015 Feb;12(2): 143-50). 4. Aortic  Atherosclerosis (ICD10-I70.0). Mild central enlargement of the pulmonary arteries suggesting pulmonary arterial hypertension. Electronically Signed   By: WRichardean SaleM.D.   On: 01/27/2020 17:58   DG Chest Port 1 View  Result Date: 01/27/2020 CLINICAL DATA:  Altered mental status for 3 days. Fever. EXAM: PORTABLE CHEST 1 VIEW COMPARISON:  Radiographs 12/15/2019 and 12/11/2019. Abdominal CT 12/28/2019. FINDINGS: 1417 hours. Left subclavian AICD with leads in the right atrium, right ventricle and coronary sinus. Stable heart size and mediastinal contours post median sternotomy and CABG. There is mild aortic atherosclerosis. The lungs are clear. There is no pleural effusion or pneumothorax. No acute osseous findings. Telemetry leads overlie the chest. IMPRESSION: Stable chest. No active cardiopulmonary process.  Electronically Signed   By: Richardean Sale M.D.   On: 01/27/2020 14:31        Scheduled Meds: . amLODipine  5 mg Per Tube Daily  . aspirin  81 mg Per J Tube Daily  . atorvastatin  80 mg Per Tube q morning - 10a  . carvedilol  3.125 mg Per Tube BID  . clopidogrel  75 mg Per Tube Daily  . donepezil  10 mg Per Tube QHS  . feeding supplement (JEVITY 1.5 CAL/FIBER)  237 mL Per Tube 5 X Daily  . feeding supplement (PRO-STAT SUGAR FREE 64)  30 mL Per Tube Daily  . ferrous sulfate  300 mg Per Tube Q breakfast  . free water  300 mL Per Tube Q6H  . heparin  5,000 Units Subcutaneous Q8H  . insulin aspart  0-15 Units Subcutaneous TID WC  . insulin aspart  10 Units Subcutaneous TID WC  . insulin glargine  35 Units Subcutaneous BID  . pantoprazole sodium  40 mg Per Tube QAC breakfast  . polyethylene glycol  17 g Per Tube BID  . scopolamine  1 patch Transdermal Q72H  . senna-docusate  1 tablet Per Tube BID   Continuous Infusions: . sodium chloride 75 mL/hr at 01/29/20 0050  . ampicillin-sulbactam (UNASYN) IV 3 g (01/29/20 0436)     LOS: 2 days    Time spent:35 mins, More than 50% of  that time was spent in counseling and/or coordination of care.      Shelly Coss, MD [facility] P4/08/2020, 8:30 AM

## 2020-01-30 ENCOUNTER — Inpatient Hospital Stay (HOSPITAL_COMMUNITY): Payer: Medicare Other

## 2020-01-30 DIAGNOSIS — J690 Pneumonitis due to inhalation of food and vomit: Secondary | ICD-10-CM | POA: Diagnosis not present

## 2020-01-30 DIAGNOSIS — R609 Edema, unspecified: Secondary | ICD-10-CM

## 2020-01-30 LAB — CBC WITH DIFFERENTIAL/PLATELET
Abs Immature Granulocytes: 0.04 10*3/uL (ref 0.00–0.07)
Basophils Absolute: 0 10*3/uL (ref 0.0–0.1)
Basophils Relative: 0 %
Eosinophils Absolute: 0.1 10*3/uL (ref 0.0–0.5)
Eosinophils Relative: 1 %
HCT: 33.3 % — ABNORMAL LOW (ref 36.0–46.0)
Hemoglobin: 9.8 g/dL — ABNORMAL LOW (ref 12.0–15.0)
Immature Granulocytes: 0 %
Lymphocytes Relative: 21 %
Lymphs Abs: 2.6 10*3/uL (ref 0.7–4.0)
MCH: 27.8 pg (ref 26.0–34.0)
MCHC: 29.4 g/dL — ABNORMAL LOW (ref 30.0–36.0)
MCV: 94.6 fL (ref 80.0–100.0)
Monocytes Absolute: 1 10*3/uL (ref 0.1–1.0)
Monocytes Relative: 8 %
Neutro Abs: 8.8 10*3/uL — ABNORMAL HIGH (ref 1.7–7.7)
Neutrophils Relative %: 70 %
Platelets: 182 10*3/uL (ref 150–400)
RBC: 3.52 MIL/uL — ABNORMAL LOW (ref 3.87–5.11)
RDW: 13.2 % (ref 11.5–15.5)
WBC: 12.5 10*3/uL — ABNORMAL HIGH (ref 4.0–10.5)
nRBC: 0 % (ref 0.0–0.2)

## 2020-01-30 LAB — BASIC METABOLIC PANEL
Anion gap: 9 (ref 5–15)
BUN: 21 mg/dL (ref 8–23)
CO2: 27 mmol/L (ref 22–32)
Calcium: 9.4 mg/dL (ref 8.9–10.3)
Chloride: 113 mmol/L — ABNORMAL HIGH (ref 98–111)
Creatinine, Ser: 0.73 mg/dL (ref 0.44–1.00)
GFR calc Af Amer: >60 mL/min (ref >60)
GFR calc non Af Amer: >60 mL/min (ref >60)
Glucose, Bld: 182 mg/dL — ABNORMAL HIGH (ref 70–99)
Potassium: 3.1 mmol/L — ABNORMAL LOW (ref 3.5–5.1)
Sodium: 149 mmol/L — ABNORMAL HIGH (ref 135–145)

## 2020-01-30 LAB — HEMOGLOBIN A1C
Hgb A1c MFr Bld: 8.6 % — ABNORMAL HIGH (ref 4.8–5.6)
Mean Plasma Glucose: 200 mg/dL

## 2020-01-30 LAB — GLUCOSE, CAPILLARY
Glucose-Capillary: 137 mg/dL — ABNORMAL HIGH (ref 70–99)
Glucose-Capillary: 141 mg/dL — ABNORMAL HIGH (ref 70–99)
Glucose-Capillary: 175 mg/dL — ABNORMAL HIGH (ref 70–99)
Glucose-Capillary: 180 mg/dL — ABNORMAL HIGH (ref 70–99)
Glucose-Capillary: 230 mg/dL — ABNORMAL HIGH (ref 70–99)
Glucose-Capillary: 289 mg/dL — ABNORMAL HIGH (ref 70–99)

## 2020-01-30 LAB — URINE CULTURE: Culture: >=100000 — AB

## 2020-01-30 MED ORDER — POTASSIUM CHLORIDE 20 MEQ PO PACK
40.0000 meq | PACK | ORAL | Status: AC
  Administered 2020-01-30 (×2): 40 meq via ORAL
  Filled 2020-01-30 (×2): qty 2

## 2020-01-30 MED ORDER — SODIUM CHLORIDE 0.9 % IV SOLN
3.0000 g | Freq: Four times a day (QID) | INTRAVENOUS | Status: DC
  Administered 2020-01-30 – 2020-01-31 (×5): 3 g via INTRAVENOUS
  Filled 2020-01-30 (×2): qty 8
  Filled 2020-01-30 (×2): qty 3
  Filled 2020-01-30 (×2): qty 8
  Filled 2020-01-30: qty 3

## 2020-01-30 MED ORDER — JEVITY 1.2 CAL PO LIQD
1000.0000 mL | ORAL | Status: DC
  Administered 2020-01-30 – 2020-02-01 (×2): 1000 mL
  Filled 2020-01-30 (×5): qty 1000

## 2020-01-30 MED ORDER — INSULIN GLARGINE 100 UNIT/ML ~~LOC~~ SOLN
25.0000 [IU] | Freq: Two times a day (BID) | SUBCUTANEOUS | Status: DC
  Administered 2020-01-30 – 2020-02-03 (×9): 25 [IU] via SUBCUTANEOUS
  Filled 2020-01-30 (×9): qty 0.25

## 2020-01-30 NOTE — Progress Notes (Signed)
Orthopedic Tech Progress Note Patient Details:  [name] 1928-07-10 [id number]  Ortho Devices Type of Ortho Device: Prafo boot/shoe Ortho Device/Splint Location: Bilateral prafo boots Ortho Device/Splint Interventions: Application   Post Interventions Patient Tolerated: Well Instructions Provided: Care of device   [name] 01/30/2020, 11:15 AM

## 2020-01-30 NOTE — Progress Notes (Signed)
Pharmacy Antibiotic Note  [name] is a 86 y.o. female admitted on 01/27/2020 with sepsis secondary to aspiration pneumonia/UTI.  Pharmacy has been consulted for Unasyn dosing.  Plan: Unasyn 3g IV q6h.  Monitor renal function, cultures, clinical course.   Height: 5' 7"  (170.2 cm) Weight: 74.9 kg (165 lb 2 oz) IBW/kg (Calculated) : 61.6  Temp (24hrs), Avg:98.3 F (36.8 C), Min:97.9 F (36.6 C), Max:98.9 F (37.2 C)  Recent Labs  Lab 01/27/20 1333 01/27/20 1453 01/28/20 0321 01/29/20 0325 01/30/20 0331  WBC  --  15.6* 21.3* 15.8* 12.5*  CREATININE  --  1.21* 0.89 0.87 0.73  LATICACIDVEN 1.8  --   --   --   --     Estimated Creatinine Clearance: 50.3 mL/min (by C-G formula based on SCr of 0.73 mg/dL).    No Known Allergies  Antimicrobials this admission: 4/9 Cefepime >> 4/10 4/9 Vancomycin >> 4/10 4/10 Unasyn >> 4/11, resumed 4/12 >> 4/11 Ceftriaxone >> 4/12 4/11 Azithromycin >> 4/12  Microbiology results: 4/9 BCx: NGTD 4/9 UCx: > 100K E.coli, Strep viridans   4/9 MRSA PCR: negative 4/9 COVID: negative  4/12 COVID: sent  Thank you for allowing pharmacy to be a part of this patient's care.    Lindell Spar, PharmD, BCPS Clinical Pharmacist  01/30/2020 5:16 PM

## 2020-01-30 NOTE — Progress Notes (Addendum)
   01/30/20 1540  Gastrointestinal  Gastrointestinal (WDL) X  Abdomen Inspection Soft;Obese  Bowel Sounds Assessment Active  Last BM Date 01/30/20  GI Symptoms Vomiting;Other (Comment) (Vomiting at 1540, MD notified. )  MD updated. RN will hold tube feeds temporarily and update dietician to advise.

## 2020-01-30 NOTE — Care Management Important Message (Signed)
Important Message  Patient Details IM Letter given to Kathrin Greathouse SW Case Manager to present to the Patient Name: [name] MRN: [id number] Date of Birth: Aug 11, 1928   Medicare Important Message Given:  Yes     Kerin Salen 01/30/2020, 2:22 PM

## 2020-01-30 NOTE — Progress Notes (Signed)
Upper venous duplex       has been completed. Preliminary results can be found under CV proc through chart review. June Leap, BS, RDMS, RVT

## 2020-01-30 NOTE — Progress Notes (Signed)
PROGRESS NOTE    [name]  LPF:[id number] DOB: 05-25-1928 DOA: 01/27/2020 PCP: [name], MD   Brief Narrative:  Patient is a 86 year old female with history of coronary artery disease status post CABG, complete heart block status post pacemaker, CVA with residual right-sided deficit, dysphagia, status post PEG placement, CKD stage IIIb, diabetes type 2 who presents from [facility] rehab for the concern of aspiration pneumonia. Patient is bedbound/wheelchair dependent on ambulation. On presentation she was febrile, tachypneic requiring oxygen, found to have leukocytosis, hyponatremia, hyperglycemia. CTA chest showed left lower opacity concerning for aspiration pneumonia.Also found to have urinary tract infection. Anticipate discharge back to the facility tomorrow.   Assessment & Plan:   Principal Problem:   Aspiration pneumonia ([place]) Active Problems:   Hypertension   Type 2 diabetes mellitus (HCC)   Heart block   Acute kidney failure, unspecified (HCC)   Stage 3b chronic kidney disease   Hypercalcemia   Hypernatremia   Dysphagia   Sepsis ([place])   Transaminitis   Suspected sepsis secondary to aspiration pneumonia/UTI: Aspiration pneumonia as per CT. Patient is n.p.o. and has a PEG. She was tachypneic, requiring oxygen supplementation for maintenance of optimal saturation on presentation. Started on IV vancomycin, cefepime on admission.Changed to ceftriaxone ,azithromycin.  Follow-up cultures.She has significant leukocytosis.  Urine culture showing pansenstiv Ecoli. Currently on room air.  Hypernatremia: Sodium of 149 today. Continue hypotonic saline. Check BMP tomorrow  Diabetes type 2 with hyperglycemia: Looks uncontrolled.  Follow-up hemoglobin A1c . Takes  insulin . Continue current regimen with Lantus, NovoLog, sliding scale.  I have discussed with RN about decreasing the volume of her  tube feeding.  AKI on CKD status 3B: Improved with IV fluids. Currently kidney  function stable  Chronic normocytic anemia: Stable.  Hypercalcemia: Resolved  Hypertension: Currently blood stable. Continue current medications  History of CVA: With residual right-sided deficits, dysphagia. Status post PEG placement. On aspirin, Plavix and statin which we will continue.  History of coronary artery disease: Status post CABG. Status post pacemaker placement for complete heart block.  Right upper extremity swelling: We have requested for venous Doppler to rule out DVT.  But most likely it is associated with IV infiltration.  Goals of care: Patient is 86 year old with multiple comorbidities, nonverbal and bedbound.  She is still a full code.  We recommend to follow-up with palliative care as an outpatient.  She is a candidate for hospice care.  Nutrition Problem: Inadequate oral intake Etiology: inability to eat, dysphagia      DVT prophylaxis: Heparin [place] Code Status: Full Family Communication: Discussed with daughter on phone on 01/29/2020  Status is: Inpatient  Remains inpatient appropriate because:Persistent severe electrolyte disturbances, decreased level of consciousness.   Dispo: The patient is from: SNF              Anticipated d/c is to: SNF              Anticipated d/c date is: 1 day              Patient currently is not medically stable to d/c.          Consultants: None  Procedures: None  Antimicrobials:  Anti-infectives (From admission, onward)   Start     Dose/Rate Route Frequency Ordered Stop   01/29/20 1000  cefTRIAXone (ROCEPHIN) 1 g in sodium chloride 0.9 % 100 mL IVPB     1 g 200 mL/hr over 30 Minutes Intravenous Every 24 hours 01/29/20 5329  01/29/20 1000  azithromycin (ZITHROMAX) 500 mg in sodium chloride 0.9 % 250 mL IVPB     500 mg 250 mL/hr over 60 Minutes Intravenous Every 24 hours 01/29/20 0833 02/01/20 0959   01/28/20 2200  vancomycin (VANCOREADY) IVPB 750 mg/150 mL  Status:  Discontinued     750 mg 150 mL/hr over  60 Minutes Intravenous Every 24 hours 01/27/20 1926 01/28/20 0821   01/28/20 1000  Ampicillin-Sulbactam (UNASYN) 3 g in sodium chloride 0.9 % 100 mL IVPB  Status:  Discontinued     3 g 200 mL/hr over 30 Minutes Intravenous Every 6 hours 01/28/20 0909 01/29/20 0833   01/27/20 2200  ceFEPIme (MAXIPIME) 2 g in sodium chloride 0.9 % 100 mL IVPB  Status:  Discontinued     2 g 200 mL/hr over 30 Minutes Intravenous Every 12 hours 01/27/20 1926 01/28/20 0821   01/27/20 1445  vancomycin (VANCOREADY) IVPB 1750 mg/350 mL     1,750 mg 175 mL/hr over 120 Minutes Intravenous  Once 01/27/20 1419 01/27/20 2026   01/27/20 1415  vancomycin (VANCOCIN) IVPB 1000 mg/200 mL premix  Status:  Discontinued     1,000 mg 200 mL/hr over 60 Minutes Intravenous  Once 01/27/20 1401 01/27/20 1419   01/27/20 1415  ceFEPIme (MAXIPIME) 2 g in sodium chloride 0.9 % 100 mL IVPB     2 g 200 mL/hr over 30 Minutes Intravenous  Once 01/27/20 1401 01/27/20 1600      Subjective:  Patient seen and examined at the bedside this morning.  Hemodynamically stable but remains very drowsy, sleepy and lethargic.  Opens her eyes on calling her name.  Denies any discomfort.  Found to have swelling of her right upper extremity today.  Objective: Vitals:   01/29/20 1100 01/29/20 1959 01/30/20 0133 01/30/20 0416  BP:  (!) 180/67 (!) 166/61 (!) 156/68  Pulse:  78 78 70  Resp:  15 15 16   Temp:  98 F (36.7 C) 98.9 F (37.2 C) 98.4 F (36.9 C)  TempSrc:      SpO2: 96% 99% 98% 97%  Weight:      Height:        Intake/Output Summary (Last 24 hours) at 01/30/2020 0846 Last data filed at 01/30/2020 0655 Gross per 24 hour  Intake 3763.23 ml  Output 1400 ml  Net 2363.23 ml   Filed Weights   01/27/20 2242  Weight: 74.9 kg    Examination:  General exam: Currently looking, weak, lethargic Respiratory system:  no wheezes or crackles  Cardiovascular system: S1 & S2 heard, RRR. No JVD, murmurs, rubs, gallops or  clicks. Gastrointestinal system: Abdomen is nondistended, soft and nontender. No organomegaly or masses felt. Normal bowel sounds heard. Central nervous system: Not Alert and oriented Extremities: No edema, no clubbing ,no cyanosis Skin: No rashes, lesions or ulcers,no icterus ,no pallor     Data Reviewed: I have personally reviewed following labs and imaging studies  CBC: Recent Labs  Lab 01/27/20 1453 01/28/20 0321 01/29/20 0325 01/30/20 0331  WBC 15.6* 21.3* 15.8* 12.5*  NEUTROABS 12.1*  --  12.2* 8.8*  HGB 11.5* 10.1* 10.3* 9.8*  HCT 38.7 34.4* 35.1* 33.3*  MCV 95.8 94.8 96.7 94.6  PLT 214 251 243 093   Basic Metabolic Panel: Recent Labs  Lab 01/27/20 1453 01/28/20 0321 01/29/20 0325 01/30/20 0331  NA 150* 152* 149* 149*  K 4.6 3.9 3.6 3.1*  CL 110 117* 116* 113*  CO2 29 27 25 27   GLUCOSE 424* 276*  401* 182*  BUN 43* 34* 27* 21  CREATININE 1.21* 0.89 0.87 0.73  CALCIUM 11.1* 10.3 9.9 9.4   GFR: Estimated Creatinine Clearance: 50.3 mL/min (by C-G formula based on SCr of 0.73 mg/dL). Liver Function Tests: Recent Labs  Lab 01/27/20 1453 01/28/20 0321  AST 62* 28  ALT 78* 53*  ALKPHOS 89 77  BILITOT 0.5 0.7  PROT 7.8 6.8  ALBUMIN 3.4* 3.0*   No results for input(s): LIPASE, AMYLASE in the last 168 hours. Recent Labs  Lab 01/27/20 1817  AMMONIA 22   Coagulation Profile: Recent Labs  Lab 01/27/20 1454  INR 1.1   Cardiac Enzymes: No results for input(s): CKTOTAL, CKMB, CKMBINDEX, TROPONINI in the last 168 hours. BNP (last 3 results) No results for input(s): PROBNP in the last 8760 hours. HbA1C: No results for input(s): HGBA1C in the last 72 hours. CBG: Recent Labs  Lab 01/29/20 1955 01/29/20 2309 01/30/20 0005 01/30/20 0412 01/30/20 0739  GLUCAP 340* 290* 289* 141* 175*   Lipid Profile: No results for input(s): CHOL, HDL, LDLCALC, TRIG, CHOLHDL, LDLDIRECT in the last 72 hours. Thyroid Function Tests: No results for input(s): TSH,  T4TOTAL, FREET4, T3FREE, THYROIDAB in the last 72 hours. Anemia Panel: No results for input(s): VITAMINB12, FOLATE, FERRITIN, TIBC, IRON, RETICCTPCT in the last 72 hours. Sepsis Labs: Recent Labs  Lab 01/27/20 1333  LATICACIDVEN 1.8    Recent Results (from the past 240 hour(s))  Blood Culture (routine x 2)     Status: None (Preliminary result)   Collection Time: 01/27/20  2:53 PM   Specimen: BLOOD  Result Value Ref Range Status   Specimen Description   Final    BLOOD RIGHT ANTECUBITAL Performed at [facility] [address], [place], [place] [address]    Special Requests   Final    BOTTLES DRAWN AEROBIC AND ANAEROBIC Blood Culture adequate volume Performed at [facility] [address], [place], [place] [address]    Culture   Final    NO GROWTH 3 DAYS Performed at [facility], [place] [address], [place], [place] [address]    Report Status PENDING  Incomplete  Urine culture     Status: Abnormal   Collection Time: 01/27/20  5:39 PM   Specimen: In/Out Cath Urine  Result Value Ref Range Status   Specimen Description   Final    IN/OUT CATH URINE Performed at [facility] [address], [place], [place] [address]    Special Requests   Final    NONE Performed at [facility], [place] [address], [place], [place] [address]    Culture (A)  Final    >=100,000 COLONIES/mL ESCHERICHIA COLI >=100,000 COLONIES/mL VIRIDANS STREPTOCOCCUS Standardized susceptibility testing for this organism is not available. Performed at [facility], [place] [address], [place],  [address]    Report Status 01/30/2020 FINAL  Final   Organism ID, Bacteria ESCHERICHIA COLI (A)  Final      Susceptibility   Escherichia coli - MIC*    AMPICILLIN 8 SENSITIVE Sensitive     CEFAZOLIN <=4 SENSITIVE Sensitive     CEFTRIAXONE <=0.25 SENSITIVE Sensitive     CIPROFLOXACIN <=0.25 SENSITIVE Sensitive     GENTAMICIN <=1  SENSITIVE Sensitive     IMIPENEM <=0.25 SENSITIVE Sensitive     NITROFURANTOIN <=16 SENSITIVE Sensitive     TRIMETH/SULFA <=20 SENSITIVE Sensitive     AMPICILLIN/SULBACTAM <=2 SENSITIVE Sensitive     PIP/TAZO <=  4 SENSITIVE Sensitive     * >=100,000 COLONIES/mL ESCHERICHIA COLI  SARS CORONAVIRUS 2 (TAT 6-24 HRS) Nasopharyngeal Nasopharyngeal Swab     Status: None   Collection Time: 01/27/20  6:10 PM   Specimen: Nasopharyngeal Swab  Result Value Ref Range Status   SARS Coronavirus 2 NEGATIVE NEGATIVE Final    Comment: (NOTE) SARS-CoV-2 target nucleic acids are NOT DETECTED. The SARS-CoV-2 RNA is generally detectable in upper and lower respiratory specimens during the acute phase of infection. Negative results do not preclude SARS-CoV-2 infection, do not rule out co-infections with other pathogens, and should not be used as the sole basis for treatment or other patient management decisions. Negative results must be combined with clinical observations, patient history, and epidemiological information. The expected result is Negative. Fact Sheet for Patients: SugarRoll.be Fact Sheet for Healthcare Providers: https://www.woods-mathews.com/ This test is not yet approved or cleared by the [place] FDA and  has been authorized for detection and/or diagnosis of SARS-CoV-2 by FDA under an Emergency Use Authorization (EUA). This EUA will remain  in effect (meaning this test can be used) for the duration of the COVID-19 declaration under Section 56 4(b)(1) of the Act, 21 U.S.C. section 360bbb-3(b)(1), unless the authorization is terminated or revoked sooner. Performed at [facility], [place] [address], [place], [place] [address]   MRSA PCR Screening     Status: None   Collection Time: 01/27/20  7:29 PM   Specimen: Nasal Mucosa; Nasopharyngeal  Result Value Ref Range Status   MRSA by PCR NEGATIVE NEGATIVE Final    Comment:        The  GeneXpert MRSA Assay (FDA approved for NASAL specimens only), is one component of a comprehensive MRSA colonization surveillance program. It is not intended to diagnose MRSA infection nor to guide or monitor treatment for MRSA infections. Performed at [facility], [place] [address] [place], [place] [address]   Blood Culture (routine x 2)     Status: None (Preliminary result)   Collection Time: 01/27/20  9:18 PM   Specimen: BLOOD RIGHT HAND  Result Value Ref Range Status   Specimen Description   Final    BLOOD RIGHT HAND Performed at [facility] [address] [place], [place] [address]    Special Requests   Final    BOTTLES DRAWN AEROBIC ONLY Blood Culture adequate volume Performed at [facility] [address], [place], [place] [address]    Culture   Final    NO GROWTH 2 DAYS Performed at [facility] [address] [place], [place] [address]    Report Status PENDING  Incomplete         Radiology Studies: No results found.      Scheduled Meds: . amLODipine  10 mg Per Tube Daily  . aspirin  81 mg Per J Tube Daily  . atorvastatin  80 mg Per Tube q morning - 10a  . carvedilol  3.125 mg Per Tube BID  . clopidogrel  75 mg Per Tube Daily  . donepezil  10 mg Per Tube QHS  . feeding supplement (JEVITY 1.5 CAL/FIBER)  237 mL Per Tube 5 X Daily  . feeding supplement (PRO-STAT SUGAR FREE 64)  30 mL Per Tube Daily  . ferrous sulfate  300 mg Per Tube Q breakfast  . free water  300 mL Per Tube Q6H  . heparin  5,000 Units Subcutaneous Q8H  . insulin aspart  0-15 Units  Subcutaneous TID WC  . insulin aspart  10 Units Subcutaneous TID WC  . insulin glargine  35 Units Subcutaneous BID  . pantoprazole sodium  40 mg Per Tube QAC breakfast  . polyethylene glycol  17 g Per Tube BID  . potassium chloride  40 mEq Oral Q2H  . scopolamine  1 patch Transdermal Q72H  . senna-docusate  1 tablet Per Tube BID    Continuous Infusions: . sodium chloride 75 mL/hr at 01/30/20 0710  . azithromycin 500 mg (01/29/20 1047)  . cefTRIAXone (ROCEPHIN)  IV 1 g (01/29/20 0958)     LOS: 3 days    Time spent:25 mins, More than 50% of that time was spent in counseling and/or coordination of care.      Shelly Coss, MD [facility] P4/09/2020, 8:46 AM
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
## 2020-01-31 DIAGNOSIS — J690 Pneumonitis due to inhalation of food and vomit: Secondary | ICD-10-CM | POA: Diagnosis not present

## 2020-01-31 LAB — BASIC METABOLIC PANEL
Anion gap: 7 (ref 5–15)
BUN: 15 mg/dL (ref 8–23)
CO2: 29 mmol/L (ref 22–32)
Calcium: 10 mg/dL (ref 8.9–10.3)
Chloride: 112 mmol/L — ABNORMAL HIGH (ref 98–111)
Creatinine, Ser: 0.71 mg/dL (ref 0.44–1.00)
GFR calc Af Amer: >60 mL/min (ref >60)
GFR calc non Af Amer: >60 mL/min (ref >60)
Glucose, Bld: 186 mg/dL — ABNORMAL HIGH (ref 70–99)
Potassium: 3.9 mmol/L (ref 3.5–5.1)
Sodium: 148 mmol/L — ABNORMAL HIGH (ref 135–145)

## 2020-01-31 LAB — CBC WITH DIFFERENTIAL/PLATELET
Abs Immature Granulocytes: 0.04 10*3/uL (ref 0.00–0.07)
Basophils Absolute: 0 10*3/uL (ref 0.0–0.1)
Basophils Relative: 0 %
Eosinophils Absolute: 0.1 10*3/uL (ref 0.0–0.5)
Eosinophils Relative: 1 %
HCT: 33.8 % — ABNORMAL LOW (ref 36.0–46.0)
Hemoglobin: 10.1 g/dL — ABNORMAL LOW (ref 12.0–15.0)
Immature Granulocytes: 0 %
Lymphocytes Relative: 24 %
Lymphs Abs: 2.4 10*3/uL (ref 0.7–4.0)
MCH: 28.1 pg (ref 26.0–34.0)
MCHC: 29.9 g/dL — ABNORMAL LOW (ref 30.0–36.0)
MCV: 93.9 fL (ref 80.0–100.0)
Monocytes Absolute: 0.8 10*3/uL (ref 0.1–1.0)
Monocytes Relative: 7 %
Neutro Abs: 6.8 10*3/uL (ref 1.7–7.7)
Neutrophils Relative %: 68 %
Platelets: 249 10*3/uL (ref 150–400)
RBC: 3.6 MIL/uL — ABNORMAL LOW (ref 3.87–5.11)
RDW: 13.1 % (ref 11.5–15.5)
WBC: 10.2 10*3/uL (ref 4.0–10.5)
nRBC: 0 % (ref 0.0–0.2)

## 2020-01-31 LAB — GLUCOSE, CAPILLARY
Glucose-Capillary: 187 mg/dL — ABNORMAL HIGH (ref 70–99)
Glucose-Capillary: 235 mg/dL — ABNORMAL HIGH (ref 70–99)
Glucose-Capillary: 153 mg/dL — ABNORMAL HIGH (ref 70–99)
Glucose-Capillary: 103 mg/dL — ABNORMAL HIGH (ref 70–99)

## 2020-01-31 LAB — SARS CORONAVIRUS 2 (TAT 6-24 HRS): SARS Coronavirus 2: NEGATIVE

## 2020-01-31 MED ORDER — JEVITY 1.2 CAL PO LIQD: 1000.0000 mL | ORAL | 0 refills | Status: DC

## 2020-01-31 MED ORDER — AMLODIPINE BESYLATE 10 MG PO TABS: 10.0000 mg | ORAL_TABLET | Freq: Every day | ORAL | Status: DC

## 2020-01-31 MED ORDER — INSULIN GLARGINE 100 UNIT/ML ~~LOC~~ SOLN: 25.0000 [IU] | Freq: Two times a day (BID) | SUBCUTANEOUS | 11 refills | Status: DC

## 2020-01-31 MED ORDER — TRAMADOL HCL 50 MG PO TABS: 50.0000 mg | ORAL_TABLET | Freq: Two times a day (BID) | ORAL | 0 refills | Status: DC | PRN

## 2020-01-31 MED ORDER — AMOXICILLIN-POT CLAVULANATE 875-125 MG PO TABS: 1.0000 | ORAL_TABLET | Freq: Two times a day (BID) | ORAL | Status: DC

## 2020-01-31 MED ORDER — AMOXICILLIN-POT CLAVULANATE 875-125 MG PO TABS: 1.0000 | ORAL_TABLET | Freq: Two times a day (BID) | ORAL | 0 refills | Status: DC

## 2020-01-31 MED ORDER — SODIUM CHLORIDE 0.9 % IV SOLN: INTRAVENOUS | Status: AC

## 2020-01-31 MED ORDER — AMOXICILLIN-POT CLAVULANATE 875-125 MG PO TABS
1.0000 | ORAL_TABLET | Freq: Two times a day (BID) | ORAL | Status: DC
  Administered 2020-01-31 – 2020-02-03 (×7): 1
  Filled 2020-01-31 (×7): qty 1

## 2020-01-31 NOTE — TOC Progression Note (Signed)
Transition of Care [facility]) - Progression Note    Patient Details  Name: [name] MRN: [id number] Date of Birth: 1927-12-05  Transition of Care [facility]) CM/SW [place], [place] Phone Number: 01/31/2020, 4:42 PM  Clinical Narrative:    Patient insurance authorization is still pending.  CSW notified the SNF staff and the patient daughter.    Expected Discharge Plan: [place] Barriers to Discharge: Insurance Authorization  Expected Discharge Plan and Services Expected Discharge Plan: [place] In-house Referral: NA Discharge Planning Services: CM Consult Post Acute Care Choice: [place] Living arrangements for the past 2 months: [place], Single Family Home Expected Discharge Date: 01/31/20                                     Social Determinants of Health (SDOH) Interventions    Readmission Risk Interventions No flowsheet data found.

## 2020-01-31 NOTE — Discharge Summary (Signed)
Physician Discharge Summary  [name] NID:[id number] DOB: 03-23-1928 DOA: 01/27/2020  PCP: [name], MD  Admit date: 01/27/2020 Discharge date: 01/31/2020  Admitted From: Nursing facilty Disposition: Nursing facility  Discharge Condition:Stable CODE STATUS:FULL Diet recommendation:tube feeding  Brief/Interim Summary:  Patient is a 86 year old female with history of coronary artery disease status post CABG, complete heart block status post pacemaker, CVA with residual right-sided deficit, dysphagia, status post PEG placement, CKD stage IIIb, diabetes type 2 who presents from [facility] rehab for the concern of aspiration pneumonia. Patient is bedbound/wheelchair dependent on ambulation. On presentation she was febrile, tachypneic requiring oxygen, found to have leukocytosis, hyponatremia, hyperglycemia. CTA chest showed left lower opacity concerning for aspiration pneumonia.Also found to have urinary tract infection. She is hemodynamically stable now.  Electrolytes have improved.  Urine culture showed pansensitive E. coli.  Hemodynamically stable for discharge back to skilled nursing facility today.  Following problems were addressed during hospitalization:  Suspected sepsis secondary to aspiration pneumonia/UTI:  Sepsis ruled out.  Blood cultures negative so far.   Aspiration pneumonia as per CT. Patient is n.p.o. and has a PEG. She was tachypneic, requiring oxygen supplementation for maintenance of optimal saturation on presentation. Started on IV vancomycin, cefepime on admission.Changed to unasyn. She has significant leukocytosis which has resolved.  Urine culture showing pansenstiv Ecoli. Currently on room air. Antibiotic changed to Augmentin today. Probably the bolus feeding could have contributed to aspiration of the feeding material.  We recommend to change the bolus feeding to continuous feeding at a slow rate, 40 mL/hr.Can increase the rate gradually if she  tolerates.  Hypernatremia: Improved with hypotonic saline. Check BMP in a week.  Diabetes type 2 with hyperglycemia: A1c of 8.6.  Continue Lantus at recommended dose.  AKI on CKD status 3B: Improved with IV fluids. Currently kidney function stable  Chronic normocytic anemia: Stable.  Hypercalcemia: Resolved  Hypertension: Currently blood stable. Continue current medications  History of CVA: With residual right-sided deficits, dysphagia. Status post PEG placement. On aspirin, Plavix and statin which we will continue.  History of coronary artery disease: Status post CABG. Status post pacemaker placement for complete heart block.  Right upper extremity swelling: Venous Doppler did not show DVT.  But most likely it is associated with IV infiltration.  Sleep apnea: Continue cpap at night  Goals of care: Patient is 86 year old with multiple comorbidities, nonverbal and bedbound.  She is still a full code.  We recommend to follow-up with palliative care as an outpatient.  She is a candidate for hospice care.  Discharge Diagnoses:  Principal Problem:   Aspiration pneumonia ([place]) Active Problems:   Hypertension   Type 2 diabetes mellitus (HCC)   Heart block   Acute kidney failure, unspecified (HCC)   Stage 3b chronic kidney disease   Hypercalcemia   Hypernatremia   Dysphagia   Sepsis ([place])   Transaminitis    Discharge Instructions  Discharge Instructions    Diet - low sodium heart healthy   Complete by: As directed    Discharge instructions   Complete by: As directed    1)Take prescribed medications as instructed. 2)Started tube feeding at continuous, at a slower rate, 40 m//hr per hour.Bolus feeding stopped. 3)Do a BMP test in a week. 4)Follow up with palliative care as an outpatient.   Increase activity slowly   Complete by: As directed      Allergies as of 01/31/2020   No Known Allergies     Medication List    STOP  taking these medications   lidocaine  5 % Commonly known as: LIDODERM     TAKE these medications   Acetaminophen Extra Strength 500 MG tablet Generic drug: acetaminophen Place 500 mg into feeding tube every 12 (twelve) hours. What changed: Another medication with the same name was removed. Continue taking this medication, and follow the directions you see here.   acetaminophen 325 MG tablet Commonly known as: TYLENOL Place 650 mg into feeding tube every 4 (four) hours as needed (general discomfort). What changed: Another medication with the same name was removed. Continue taking this medication, and follow the directions you see here.   amLODipine 10 MG tablet Commonly known as: NORVASC Place 1 tablet (10 mg total) into feeding tube daily. What changed:   medication strength  how much to take   amoxicillin-clavulanate 875-125 MG tablet Commonly known as: Augmentin Place 1 tablet into feeding tube 2 (two) times daily for 5 days.   aspirin 81 MG chewable tablet 1 tablet (81 mg total) by Per J Tube route daily.   atorvastatin 80 MG tablet Commonly known as: LIPITOR Place 1 tablet (80 mg total) into feeding tube every morning.   brimonidine 0.2 % ophthalmic solution Commonly known as: ALPHAGAN Place 1 drop into both eyes 2 (two) times daily.   carvedilol 3.125 MG tablet Commonly known as: COREG Place 1 tablet (3.125 mg total) into feeding tube 2 (two) times daily.   clopidogrel 75 MG tablet Commonly known as: PLAVIX Place 1 tablet (75 mg total) into feeding tube daily.   donepezil 10 MG tablet Commonly known as: ARICEPT Place 1 tablet (10 mg total) into feeding tube at bedtime. What changed: when to take this   dorzolamide 2 % ophthalmic solution Commonly known as: TRUSOPT Place 1 drop into both eyes 3 (three) times daily.   feeding supplement (JEVITY 1.2 CAL) Liqd Place 1,000 mLs into feeding tube continuous. Continue at slow rate: 40 ml/hr What changed:   how much to take  when to take  this  additional instructions   ferrous sulfate 220 (44 Fe) MG/5ML solution Take 308 mg by mouth daily. What changed: Another medication with the same name was removed. Continue taking this medication, and follow the directions you see here.   free water Soln Place 200 mLs into feeding tube 3 (three) times daily. What changed: when to take this   insulin glargine 100 UNIT/ML injection Commonly known as: LANTUS Inject 0.25 mLs (25 Units total) into the skin 2 (two) times daily. What changed: how much to take   latanoprost 0.005 % ophthalmic solution Commonly known as: XALATAN Place 1 drop into both eyes at bedtime.   pantoprazole sodium 40 mg/20 mL Pack Commonly known as: PROTONIX Place 20 mLs (40 mg total) into feeding tube daily before breakfast.   polyethylene glycol 17 g packet Commonly known as: MIRALAX / GLYCOLAX Place 17 g into feeding tube 2 (two) times daily.   scopolamine 1 MG/3DAYS Commonly known as: TRANSDERM-SCOP Place 1 patch (1.5 mg total) onto the skin every 3 (three) days.   senna-docusate 8.6-50 MG tablet Commonly known as: Senokot-S Place 1 tablet into feeding tube 2 (two) times daily.   sertraline 25 MG tablet Commonly known as: ZOLOFT Place 25 mg into feeding tube daily.   traMADol 50 MG tablet Commonly known as: ULTRAM Place 1 tablet (50 mg total) into feeding tube every 12 (twelve) hours as needed for severe pain.      Follow-up Information  [name], MD. Schedule an appointment as soon as possible for a visit in 1 week(s).   Specialty: Internal Medicine Contact information: [id number] [place] [address] [phone]          No Known Allergies  Consultations:  none   Procedures/Studies: DG Chest 1 View  Result Date: 01/30/2020 CLINICAL DATA:  Coronary artery disease EXAM: CHEST  1 VIEW COMPARISON:  01/27/2020 FINDINGS: Status post median sternotomy and CABG. Left chest multi lead pacer defibrillator. Both lungs  are clear. The visualized skeletal structures are unremarkable. IMPRESSION: No acute abnormality of the lungs in AP portable projection. Electronically Signed   By: Eddie Candle M.D.   On: 01/30/2020 17:32   DG Abd 1 View  Result Date: 01/03/2020 CLINICAL DATA:  Evaluate barium prior to potential percutaneous gastrostomy tube placement. EXAM: ABDOMEN - 1 VIEW COMPARISON:  CT abdomen pelvis-12/29/2019 FINDINGS: Enteric contrast is seen throughout the colon. No evidence of enteric obstruction. Enteric tube tip overlies expected location the gastric antrum. Nondiagnostic evaluation for pneumoperitoneum secondary to supine positioning and exclusion of the lower thorax. No pneumatosis or portal venous gas. Multiple calcified fibroids overlie the pelvis as demonstrated on previous abdominal CT. No acute osseous abnormalities. IMPRESSION: Enteric contrast seen throughout the colon. No evidence of enteric obstruction. Electronically Signed   By: Sandi Mariscal M.D.   On: 01/03/2020 09:33   CT Head Wo Contrast  Result Date: 01/27/2020 CLINICAL DATA:  Encephalopathy. Insulin-dependent diabetes, CHF. Altered mental status. Aspiration 2 nights ago. EXAM: CT HEAD WITHOUT CONTRAST TECHNIQUE: Contiguous axial images were obtained from the base of the skull through the vertex without intravenous contrast. COMPARISON:  12/15/2019 FINDINGS: Brain: There is central and cortical atrophy. Periventricular white matter changes are consistent with small vessel disease. There is no intra or extra-axial fluid collection or mass lesion. The basilar cisterns and ventricles have a normal appearance. There is no CT evidence for acute infarction or hemorrhage. Vascular: Atherosclerotic calcification of the internal carotid arteries. No hyperdense vessels. Skull: Normal. Negative for fracture or focal lesion. Sinuses/Orbits: There is minimal mucoperiosteal thickening of the maxillary sinuses bilaterally. No air-fluid levels. Other: None.  IMPRESSION: 1. No evidence for acute intracranial abnormality. 2. Atrophy and small vessel disease. 3. Minimal chronic sinusitis. Electronically Signed   By: Nolon Nations M.D.   On: 01/27/2020 16:21   CT Angio Chest PE W/Cm &/Or Wo Cm  Result Date: 01/27/2020 CLINICAL DATA:  Altered mental status for 3 days. Possible aspiration. Fever. EXAM: CT ANGIOGRAPHY CHEST WITH CONTRAST TECHNIQUE: Multidetector CT imaging of the chest was performed using the standard protocol during bolus administration of intravenous contrast. Multiplanar CT image reconstructions and MIPs were obtained to evaluate the vascular anatomy. CONTRAST:  35m OMNIPAQUE IOHEXOL 350 MG/ML SOLN COMPARISON:  Chest radiographs 01/27/2020 and 12/15/2019. Abdominal CT 12/28/2019. CT cervical spine 10/10/2019 and thyroid ultrasound 08/24/2013. FINDINGS: Cardiovascular: The pulmonary arteries are well opacified with contrast to the level of the subsegmental branches. There is no evidence of acute pulmonary embolism. Mild central enlargement of the pulmonary arteries. Atherosclerosis of the aorta, great vessels and coronary arteries status post median sternotomy and CABG. AICD leads extend into the right atrium, right ventricle and coronary sinus. The heart size is normal. There is no pericardial effusion. Mediastinum/Nodes: There are no enlarged mediastinal, hilar or axillary lymph nodes.Previously biopsied large right thyroid nodule measures up to 4.1 x 3.7 cm on image 8/4, similar to previous studies. There is mild mass effect on the trachea. The  esophagus demonstrates no significant findings. Lungs/Pleura: No pleural effusion or pneumothorax. Mild centrilobular emphysema. There is airway thickening and partial opacification the left lower lobe bronchus. There is left lower lobe volume loss and patchy airspace disease consistent with aspiration. Within the aerated portions of the left lower lobe, there are ill-defined nodular densities which are  likely inflammatory. Upper abdomen: No significant findings within the visualized upper abdomen. Musculoskeletal/Chest wall: There is no chest wall mass or suspicious osseous finding. Review of the MIP images confirms the above findings. IMPRESSION: 1. No evidence of acute pulmonary embolism or other acute vascular findings. 2. Left lower lobe volume loss and patchy airspace disease consistent with aspiration pneumonia. Ill-defined nodular densities in the aerated portions of the left lower lobe are likely inflammatory. Radiographic follow up recommended. 3. Stable large right thyroid nodule, previously biopsied. This has been documented on prior studies (ref: J Am Coll Radiol. 2015 Feb;12(2): 143-50). 4. Aortic Atherosclerosis (ICD10-I70.0). Mild central enlargement of the pulmonary arteries suggesting pulmonary arterial hypertension. Electronically Signed   By: Richardean Sale M.D.   On: 01/27/2020 17:58   IR GASTROSTOMY TUBE MOD SED  Result Date: 01/03/2020 INDICATION: Dysphagia. Please perform percutaneous gastrostomy tube placement for enteric nutrition supplementation purposes. EXAM: PULL TROUGH GASTROSTOMY TUBE PLACEMENT COMPARISON:  CT abdomen pelvis-12/28/2019; abdominal radiograph-earlier same day MEDICATIONS: Ancef 2 gm IV; Antibiotics were administered within 1 hour of the procedure. Glucagon 1 mg IV CONTRAST:  10 mL of Omnipaque 300 administered into the gastric lumen. ANESTHESIA/SEDATION: Moderate (conscious) sedation was employed during this procedure. A total of Versed 0.5 mg and Fentanyl 25 mcg was administered intravenously. Moderate Sedation Time: 10 minutes. The patient's level of consciousness and vital signs were monitored continuously by radiology nursing throughout the procedure under my direct supervision. FLUOROSCOPY TIME:  3 minutes, 42 seconds (61.9 mGy) COMPLICATIONS: None immediate. PROCEDURE: Informed written consent was obtained from the patient following explanation of the  procedure, risks, benefits and alternatives. A time out was performed prior to the initiation of the procedure. Ultrasound scanning was performed to demarcate the edge of the left lobe of the liver. Maximal barrier sterile technique utilized including caps, mask, sterile gowns, sterile gloves, large sterile drape, hand hygiene and Betadine prep. The left upper quadrant was sterilely prepped and draped. An oral gastric catheter was inserted into the stomach under fluoroscopy. The existing nasogastric feeding tube was removed. The left costal margin and barium/air opacified transverse colon were identified and avoided. Air was injected into the stomach for insufflation and visualization under fluoroscopy. Under sterile conditions a 17 gauge trocar needle was utilized to access the stomach percutaneously beneath the left subcostal margin after the overlying soft tissues were anesthetized with 1% Lidocaine with epinephrine. Needle position was confirmed within the stomach with aspiration of air and injection of small amount of contrast. A single T tack was deployed for gastropexy. Over an Amplatz guide wire, a 9-French sheath was inserted into the stomach. A snare device was utilized to capture the oral gastric catheter. The snare device was pulled retrograde from the stomach up the esophagus and out the oropharynx. The 20-French pull-through gastrostomy was connected to the snare device and pulled antegrade through the oropharynx down the esophagus into the stomach and then through the percutaneous tract external to the patient. The gastrostomy was assembled externally. Contrast injection confirms position in the stomach. Several spot radiographic images were obtained in various obliquities for documentation. The patient tolerated procedure well without immediate post procedural complication. FINDINGS: After  successful fluoroscopic guided placement, the gastrostomy tube is appropriately positioned with internal disc  against the ventral aspect of the gastric lumen. IMPRESSION: Successful fluoroscopic insertion of a 20-French pull-through gastrostomy tube. The gastrostomy may be used immediately for medication administration and in 24 hrs for the initiation of feeds. Electronically Signed   By: Sandi Mariscal M.D.   On: 01/03/2020 11:34   DG Chest Port 1 View  Result Date: 01/27/2020 CLINICAL DATA:  Altered mental status for 3 days. Fever. EXAM: PORTABLE CHEST 1 VIEW COMPARISON:  Radiographs 12/15/2019 and 12/11/2019. Abdominal CT 12/28/2019. FINDINGS: 1417 hours. Left subclavian AICD with leads in the right atrium, right ventricle and coronary sinus. Stable heart size and mediastinal contours post median sternotomy and CABG. There is mild aortic atherosclerosis. The lungs are clear. There is no pleural effusion or pneumothorax. No acute osseous findings. Telemetry leads overlie the chest. IMPRESSION: Stable chest. No active cardiopulmonary process. Electronically Signed   By: Richardean Sale M.D.   On: 01/27/2020 14:31   VAS [place] UPPER EXTREMITY VENOUS DUPLEX  Result Date: 01/30/2020 UPPER VENOUS STUDY  Indications: Swelling Limitations: Patient positioning. Comparison Study: no prior Performing Technologist: June Leap RDMS, RVT  Examination Guidelines: A complete evaluation includes B-mode imaging, spectral Doppler, color Doppler, and power Doppler as needed of all accessible portions of each vessel. Bilateral testing is considered an integral part of a complete examination. Limited examinations for reoccurring indications may be performed as noted.  Right Findings: +----------+------------+---------+-----------+----------+-------------------+ RIGHT     CompressiblePhasicitySpontaneousProperties      Summary       +----------+------------+---------+-----------+----------+-------------------+ IJV           Full       Yes       Yes                                   +----------+------------+---------+-----------+----------+-------------------+ Subclavian               Yes       Yes              not well visualized +----------+------------+---------+-----------+----------+-------------------+ Axillary      Full       Yes       Yes                                  +----------+------------+---------+-----------+----------+-------------------+ Brachial      Full       Yes       Yes                                  +----------+------------+---------+-----------+----------+-------------------+ Radial        Full                                                      +----------+------------+---------+-----------+----------+-------------------+ Ulnar         Full                                                      +----------+------------+---------+-----------+----------+-------------------+  Cephalic      Full                                                      +----------+------------+---------+-----------+----------+-------------------+ Basilic       Full                                                      +----------+------------+---------+-----------+----------+-------------------+  Left Findings: +----+------------+---------+-----------+----------+--------------+ LEFTCompressiblePhasicitySpontaneousProperties   Summary     +----+------------+---------+-----------+----------+--------------+ IJV                                           Not visualized +----+------------+---------+-----------+----------+--------------+  Summary:  Right: No evidence of deep vein thrombosis in the upper extremity. No evidence of superficial vein thrombosis in the upper extremity.  *See table(s) above for measurements and observations.    Preliminary        Subjective:  Patient seen and examined at the bedside this morning.  Hemodynamically stable for discharge to nursing facility today.  Discharge Exam: Vitals:   01/31/20 0608  01/31/20 0855  BP: (!) 173/75   Pulse: 70 78  Resp: 17   Temp: 98.6 F (37 C)   SpO2: 100%    Vitals:   01/30/20 1454 01/30/20 2259 01/31/20 0608 01/31/20 0855  BP: (!) 143/56 (!) 167/63 (!) 173/75   Pulse: 72 76 70 78  Resp: 16 15 17    Temp: 97.9 F (36.6 C) 98.9 F (37.2 C) 98.6 F (37 C)   TempSrc: Axillary     SpO2:  100% 100%   Weight:      Height:        General: Pt is  not in acute distress Cardiovascular: RRR, S1/S2 +, no rubs, no gallops Respiratory: CTA bilaterally, no wheezing, no rhonchi Abdominal: Soft, NT, ND, bowel sounds + Extremities: no edema, no cyanosis    The results of significant diagnostics from this hospitalization (including imaging, microbiology, ancillary and laboratory) are listed below for reference.     Microbiology: Recent Results (from the past 240 hour(s))  Blood Culture (routine x 2)     Status: None (Preliminary result)   Collection Time: 01/27/20  2:53 PM   Specimen: BLOOD  Result Value Ref Range Status   Specimen Description   Final    BLOOD RIGHT ANTECUBITAL Performed at [facility] [address] [place], [place] [address]    Special Requests   Final    BOTTLES DRAWN AEROBIC AND ANAEROBIC Blood Culture adequate volume Performed at [facility] [address], [place], [place] [address]    Culture   Final    NO GROWTH 4 DAYS Performed at [facility], [place] [address] [place], [place] [address]    Report Status PENDING  Incomplete  Urine culture     Status: Abnormal   Collection Time: 01/27/20  5:39 PM   Specimen: In/Out Cath Urine  Result Value Ref Range Status   Specimen Description   Final    IN/OUT CATH URINE Performed at [facility], [address] [place], [place] [address]    Special Requests   Final    NONE Performed at [facility], [place] [address], [place], [place] [address]    Culture (A)  Final    >=100,000  COLONIES/mL ESCHERICHIA COLI >=100,000 COLONIES/mL VIRIDANS STREPTOCOCCUS Standardized susceptibility testing for this organism is not available. Performed at [facility], [place] [address], [place], [place] [address]    Report Status 01/30/2020 FINAL  Final   Organism ID, Bacteria ESCHERICHIA COLI (A)  Final      Susceptibility   Escherichia coli - MIC*    AMPICILLIN 8 SENSITIVE Sensitive     CEFAZOLIN <=4 SENSITIVE Sensitive     CEFTRIAXONE <=0.25 SENSITIVE Sensitive     CIPROFLOXACIN <=0.25 SENSITIVE Sensitive     GENTAMICIN <=1 SENSITIVE Sensitive     IMIPENEM <=0.25 SENSITIVE Sensitive     NITROFURANTOIN <=16 SENSITIVE Sensitive     TRIMETH/SULFA <=20 SENSITIVE Sensitive     AMPICILLIN/SULBACTAM <=2 SENSITIVE Sensitive     PIP/TAZO <=4 SENSITIVE Sensitive     * >=100,000 COLONIES/mL ESCHERICHIA COLI  SARS CORONAVIRUS 2 (TAT 6-24 HRS) Nasopharyngeal Nasopharyngeal Swab     Status: None   Collection Time: 01/27/20  6:10 PM   Specimen: Nasopharyngeal Swab  Result Value Ref Range Status   SARS Coronavirus 2 NEGATIVE NEGATIVE Final    Comment: (NOTE) SARS-CoV-2 target nucleic acids are NOT DETECTED. The SARS-CoV-2 RNA is generally detectable in upper and lower respiratory specimens during the acute phase of infection. Negative results do not preclude SARS-CoV-2 infection, do not rule out co-infections with other pathogens, and should not be used as the sole basis for treatment or other patient management decisions. Negative results must be combined with clinical observations, patient history, and epidemiological information. The expected result is Negative. Fact Sheet for Patients: SugarRoll.be Fact Sheet for Healthcare Providers: https://www.woods-mathews.com/ This test is not yet approved or cleared by the [place] FDA and  has been authorized for detection and/or diagnosis of SARS-CoV-2 by FDA under an Emergency Use  Authorization (EUA). This EUA will remain  in effect (meaning this test can be used) for the duration of the COVID-19 declaration under Section 56 4(b)(1) of the Act, 21 U.S.C. section 360bbb-3(b)(1), unless the authorization is terminated or revoked sooner. Performed at [facility], [place] [address], [place], [place] [address]   MRSA PCR Screening     Status: None   Collection Time: 01/27/20  7:29 PM   Specimen: Nasal Mucosa; Nasopharyngeal  Result Value Ref Range Status   MRSA by PCR NEGATIVE NEGATIVE Final    Comment:        The GeneXpert MRSA Assay (FDA approved for NASAL specimens only), is one component of a comprehensive MRSA colonization surveillance program. It is not intended to diagnose MRSA infection nor to guide or monitor treatment for MRSA infections. Performed at [facility], [place] [address] [place], [place] [address]   Blood Culture (routine x 2)     Status: None (Preliminary result)   Collection Time: 01/27/20  9:18 PM   Specimen: BLOOD RIGHT HAND  Result Value Ref Range Status   Specimen Description   Final    BLOOD RIGHT HAND Performed at [facility] [address], [place], [place] [address]    Special Requests   Final    BOTTLES DRAWN AEROBIC ONLY Blood Culture adequate volume Performed at [facility] [address] [place],  [place] [address]    Culture   Final    NO GROWTH 3 DAYS Performed at [facility], [place] [address] [place], [place] [address]    Report Status PENDING  Incomplete  SARS CORONAVIRUS 2 (TAT 6-24 HRS) Nasopharyngeal Nasopharyngeal Swab     Status: None   Collection Time: 01/30/20  1:39 PM   Specimen: Nasopharyngeal Swab  Result Value Ref Range Status   SARS Coronavirus 2 NEGATIVE NEGATIVE Final    Comment: (NOTE) SARS-CoV-2 target nucleic acids are NOT DETECTED. The SARS-CoV-2 RNA is generally detectable in upper and lower respiratory specimens during  the acute phase of infection. Negative results do not preclude SARS-CoV-2 infection, do not rule out co-infections with other pathogens, and should not be used as the sole basis for treatment or other patient management decisions. Negative results must be combined with clinical observations, patient history, and epidemiological information. The expected result is Negative. Fact Sheet for Patients: SugarRoll.be Fact Sheet for Healthcare Providers: https://www.woods-mathews.com/ This test is not yet approved or cleared by the [place] FDA and  has been authorized for detection and/or diagnosis of SARS-CoV-2 by FDA under an Emergency Use Authorization (EUA). This EUA will remain  in effect (meaning this test can be used) for the duration of the COVID-19 declaration under Section 56 4(b)(1) of the Act, 21 U.S.C. section 360bbb-3(b)(1), unless the authorization is terminated or revoked sooner. Performed at [facility], [place] [address], [place], [place] [address]      Labs: BNP (last 3 results) No results for input(s): BNP in the last 8760 hours. Basic Metabolic Panel: Recent Labs  Lab 01/27/20 1453 01/28/20 0321 01/29/20 0325 01/30/20 0331 01/31/20 0318  NA 150* 152* 149* 149* 148*  K 4.6 3.9 3.6 3.1* 3.9  CL 110 117* 116* 113* 112*  CO2 29 27 25 27 29   GLUCOSE 424* 276* 401* 182* 186*  BUN 43* 34* 27* 21 15  CREATININE 1.21* 0.89 0.87 0.73 0.71  CALCIUM 11.1* 10.3 9.9 9.4 10.0   Liver Function Tests: Recent Labs  Lab 01/27/20 1453 01/28/20 0321  AST 62* 28  ALT 78* 53*  ALKPHOS 89 77  BILITOT 0.5 0.7  PROT 7.8 6.8  ALBUMIN 3.4* 3.0*   No results for input(s): LIPASE, AMYLASE in the last 168 hours. Recent Labs  Lab 01/27/20 1817  AMMONIA 22   CBC: Recent Labs  Lab 01/27/20 1453 01/28/20 0321 01/29/20 0325 01/30/20 0331 01/31/20 0318  WBC 15.6* 21.3* 15.8* 12.5* 10.2  NEUTROABS 12.1*  --  12.2* 8.8* 6.8   HGB 11.5* 10.1* 10.3* 9.8* 10.1*  HCT 38.7 34.4* 35.1* 33.3* 33.8*  MCV 95.8 94.8 96.7 94.6 93.9  PLT 214 251 243 182 249   Cardiac Enzymes: No results for input(s): CKTOTAL, CKMB, CKMBINDEX, TROPONINI in the last 168 hours. BNP: Invalid input(s): POCBNP CBG: Recent Labs  Lab 01/30/20 0739 01/30/20 1134 01/30/20 1639 01/30/20 2300 01/31/20 0756  GLUCAP 175* 180* 230* 137* 187*   D-Dimer No results for input(s): DDIMER in the last 72 hours. Hgb A1c Recent Labs    01/28/20 1418  HGBA1C 8.6*   Lipid Profile No results for input(s): CHOL, HDL, LDLCALC, TRIG, CHOLHDL, LDLDIRECT in the last 72 hours. Thyroid function studies No results for input(s): TSH, T4TOTAL, T3FREE, THYROIDAB in the last 72 hours.  Invalid input(s): FREET3 Anemia work up No results for input(s): VITAMINB12, FOLATE, FERRITIN, TIBC, IRON, RETICCTPCT in the last 72 hours. Urinalysis    Component Value  Date/Time   COLORURINE RED (A) 01/27/2020 1739   APPEARANCEUR CLOUDY (A) 01/27/2020 1739   LABSPEC 1.014 01/27/2020 1739   PHURINE 5.0 01/27/2020 1739   GLUCOSEU >=500 (A) 01/27/2020 1739   HGBUR NEGATIVE 01/27/2020 1739   BILIRUBINUR NEGATIVE 01/27/2020 1739   KETONESUR NEGATIVE 01/27/2020 1739   PROTEINUR 30 (A) 01/27/2020 1739   UROBILINOGEN 0.2 08/07/2015 1826   NITRITE NEGATIVE 01/27/2020 1739   LEUKOCYTESUR LARGE (A) 01/27/2020 1739   Sepsis Labs Invalid input(s): PROCALCITONIN,  WBC,  LACTICIDVEN Microbiology Recent Results (from the past 240 hour(s))  Blood Culture (routine x 2)     Status: None (Preliminary result)   Collection Time: 01/27/20  2:53 PM   Specimen: BLOOD  Result Value Ref Range Status   Specimen Description   Final    BLOOD RIGHT ANTECUBITAL Performed at [facility], [place] [address], [place], [place] [address]    Special Requests   Final    BOTTLES DRAWN AEROBIC AND ANAEROBIC Blood Culture adequate volume Performed at [facility] [address], [place], [place] [address]    Culture   Final    NO GROWTH 4 DAYS Performed at [facility], [place] [address], [place], [place] [address]    Report Status PENDING  Incomplete  Urine culture     Status: Abnormal   Collection Time: 01/27/20  5:39 PM   Specimen: In/Out Cath Urine  Result Value Ref Range Status   Specimen Description   Final    IN/OUT CATH URINE Performed at [facility] [address], [place], [place] [address]    Special Requests   Final    NONE Performed at [facility], [place] [address], [place], [place] [address]    Culture (A)  Final    >=100,000 COLONIES/mL ESCHERICHIA COLI >=100,000 COLONIES/mL VIRIDANS STREPTOCOCCUS Standardized susceptibility testing for this organism is not available. Performed at [facility], [place] [address] [place], [place] [address]    Report Status 01/30/2020 FINAL  Final   Organism ID, Bacteria ESCHERICHIA COLI (A)  Final      Susceptibility   Escherichia coli - MIC*    AMPICILLIN 8 SENSITIVE Sensitive     CEFAZOLIN <=4 SENSITIVE Sensitive     CEFTRIAXONE <=0.25 SENSITIVE Sensitive     CIPROFLOXACIN <=0.25 SENSITIVE Sensitive     GENTAMICIN <=1 SENSITIVE Sensitive     IMIPENEM <=0.25 SENSITIVE Sensitive     NITROFURANTOIN <=16 SENSITIVE Sensitive     TRIMETH/SULFA <=20 SENSITIVE Sensitive     AMPICILLIN/SULBACTAM <=2 SENSITIVE Sensitive     PIP/TAZO <=4 SENSITIVE Sensitive     * >=100,000 COLONIES/mL ESCHERICHIA COLI  SARS CORONAVIRUS 2 (TAT 6-24 HRS) Nasopharyngeal Nasopharyngeal Swab     Status: None   Collection Time: 01/27/20  6:10 PM   Specimen: Nasopharyngeal Swab  Result Value Ref Range Status   SARS Coronavirus 2 NEGATIVE NEGATIVE Final    Comment: (NOTE) SARS-CoV-2 target nucleic acids are NOT DETECTED. The SARS-CoV-2 RNA is generally detectable in upper and lower respiratory specimens during the acute phase of infection. Negative results  do not preclude SARS-CoV-2 infection, do not rule out co-infections with other pathogens, and should not be used as the sole basis for treatment or other patient management decisions. Negative results must be combined with clinical observations, patient history, and epidemiological information. The expected result is Negative. Fact Sheet for Patients: SugarRoll.be Fact Sheet for Healthcare Providers: https://www.woods-mathews.com/ This test is  not yet approved or cleared by the [place] and  has been authorized for detection and/or diagnosis of SARS-CoV-2 by FDA under an Emergency Use Authorization (EUA). This EUA will remain  in effect (meaning this test can be used) for the duration of the COVID-19 declaration under Section 56 4(b)(1) of the Act, 21 U.S.C. section 360bbb-3(b)(1), unless the authorization is terminated or revoked sooner. Performed at [facility], [place] [address], [place], [place] [address]   MRSA PCR Screening     Status: None   Collection Time: 01/27/20  7:29 PM   Specimen: Nasal Mucosa; Nasopharyngeal  Result Value Ref Range Status   MRSA by PCR NEGATIVE NEGATIVE Final    Comment:        The GeneXpert MRSA Assay (FDA approved for NASAL specimens only), is one component of a comprehensive MRSA colonization surveillance program. It is not intended to diagnose MRSA infection nor to guide or monitor treatment for MRSA infections. Performed at [facility], [place] [address] [place], [place] [address]   Blood Culture (routine x 2)     Status: None (Preliminary result)   Collection Time: 01/27/20  9:18 PM   Specimen: BLOOD RIGHT HAND  Result Value Ref Range Status   Specimen Description   Final    BLOOD RIGHT HAND Performed at [facility] [address], [place], [place] [address]    Special Requests   Final    BOTTLES DRAWN AEROBIC ONLY Blood Culture adequate  volume Performed at [facility] [address] [place], [place] [address]    Culture   Final    NO GROWTH 3 DAYS Performed at [facility], [place] [address] [place], [place] [address]    Report Status PENDING  Incomplete  SARS CORONAVIRUS 2 (TAT 6-24 HRS) Nasopharyngeal Nasopharyngeal Swab     Status: None   Collection Time: 01/30/20  1:39 PM   Specimen: Nasopharyngeal Swab  Result Value Ref Range Status   SARS Coronavirus 2 NEGATIVE NEGATIVE Final    Comment: (NOTE) SARS-CoV-2 target nucleic acids are NOT DETECTED. The SARS-CoV-2 RNA is generally detectable in upper and lower respiratory specimens during the acute phase of infection. Negative results do not preclude SARS-CoV-2 infection, do not rule out co-infections with other pathogens, and should not be used as the sole basis for treatment or other patient management decisions. Negative results must be combined with clinical observations, patient history, and epidemiological information. The expected result is Negative. Fact Sheet for Patients: SugarRoll.be Fact Sheet for Healthcare Providers: https://www.woods-mathews.com/ This test is not yet approved or cleared by the [place] FDA and  has been authorized for detection and/or diagnosis of SARS-CoV-2 by FDA under an Emergency Use Authorization (EUA). This EUA will remain  in effect (meaning this test can be used) for the duration of the COVID-19 declaration under Section 56 4(b)(1) of the Act, 21 U.S.C. section 360bbb-3(b)(1), unless the authorization is terminated or revoked sooner. Performed at [facility], [place] [address], [place], [place] [address]     Please note: You were cared for by a hospitalist during your hospital stay. Once you are discharged, your primary care physician will handle any further medical issues. Please note that NO REFILLS for any discharge medications will be  authorized once you are discharged, as it is imperative that you return to your primary care physician (or establish a relationship with a primary care physician if you do not  have one) for your post hospital discharge needs so that they can reassess your need for medications and monitor your lab values.    Time coordinating discharge: 40 minutes  SIGNED:   Shelly Coss, MD  [facility] 01/31/2020, 9:55 AM Pager [phone]  If 7PM-7AM, please contact night-coverage [email or web address] Password [email or web address]

## 2020-01-31 NOTE — Progress Notes (Signed)
Physical Therapy Treatment Patient Details Name: [name] MRN: [id number] DOB: 1927/10/30 Today's Date: 01/31/2020    History of Present Illness Pt is a 86 y.o. F with significant PMH of CHF, DM, CAD with pacemaker, HTN, prior stroke, who presents with sepsis 2* aspiration pna.      PT Comments    Pt assisted with bed mobility and able to sit EOB for a few minutes until fatigued.  Pt attempting to follow commands as able and will answer yes/no to questions when provided time to respond.  Pt fatigued quickly and also with wet linen (RN and NT notified).  Continue to recommend SNF upon d/c.   Follow Up Recommendations  SNF     Equipment Recommendations  None recommended by PT    Recommendations for Other Services       Precautions / Restrictions Precautions Precautions: Fall;Other (comment);ICD/Pacemaker Precaution Comments: hx aphasia from CVA  Prevalon boots reapplied and HOB left above 30* due to PEG    Mobility  Bed Mobility Overal bed mobility: Needs Assistance Bed Mobility: Supine to Sit;Sit to Supine     Supine to sit: Total assist;+2 for physical assistance Sit to supine: Total assist;+2 for physical assistance   General bed mobility comments: pt with light grip in left hand and not able to physical assist with bed mobility so required total assist  Transfers                    Ambulation/Gait                 Stairs             Wheelchair Mobility    Modified Rankin (Stroke Patients Only)       Balance Overall balance assessment: Needs assistance Sitting-balance support: Feet supported;Single extremity supported Sitting balance-Leahy Scale: Poor Sitting balance - Comments: briefly able to remain static sitting without UE support however mostly requiring HHA or UE support; cues for upright posture and pt requires assist to correct however also briefly maintaining Postural control: Posterior lean                                   Cognition Arousal/Alertness: Awake/alert Behavior During Therapy: Flat affect Overall Cognitive Status: History of cognitive impairments - at baseline                                 General Comments: pt attempting to participate as able, will answer yes/no with time      Exercises General Exercises - Upper Extremity Elbow Flexion: AROM;Left;Limitations;5 reps Elbow Flexion Limitations: pt held PT's hand and cued to pull herself forward to correct posture General Exercises - Lower Extremity Long Arc Quad: AROM;Left;10 reps;Limitations Long Arc Quad Limitations: limited AROM    General Comments        Pertinent Vitals/Pain Pain Assessment: No/denies pain    Home Living                      Prior Function            PT Goals (current goals can now be found in the care plan section) Progress towards PT goals: Progressing toward goals    Frequency    Min 2X/week      PT Plan Current plan remains appropriate    Co-evaluation  AM-PAC PT "6 Clicks" Mobility   Outcome Measure  Help needed turning from your back to your side while in a flat bed without using bedrails?: Total Help needed moving from lying on your back to sitting on the side of a flat bed without using bedrails?: Total Help needed moving to and from a bed to a chair (including a wheelchair)?: Total Help needed standing up from a chair using your arms (e.g., wheelchair or bedside chair)?: Total Help needed to walk in hospital room?: Total Help needed climbing 3-5 steps with a railing? : Total 6 Click Score: 6    End of Session   Activity Tolerance: Patient limited by fatigue Patient left: in bed;with call bell/phone within reach;with bed alarm set Nurse Communication: Mobility status PT Visit Diagnosis: Muscle weakness (generalized) (M62.81);Difficulty in walking, not elsewhere classified (R26.2);Hemiplegia and hemiparesis Hemiplegia -  Right/Left: Right Hemiplegia - dominant/non-dominant: Dominant Hemiplegia - caused by: Cerebral infarction     Time: 6701-1003 PT Time Calculation (min) (ACUTE ONLY): 13 min  Charges:  $Therapeutic Activity: 8-22 mins                    Arlyce Dice, DPT Acute Rehabilitation Services Office: [phone]  Trena Platt 01/31/2020, 12:39 PM

## 2020-01-31 NOTE — Plan of Care (Signed)

## 2020-01-31 NOTE — Progress Notes (Signed)
Patient mouth care performed today by this nurse- patient tolerated well

## 2020-02-01 DIAGNOSIS — I459 Conduction disorder, unspecified: Secondary | ICD-10-CM | POA: Diagnosis not present

## 2020-02-01 DIAGNOSIS — R1310 Dysphagia, unspecified: Secondary | ICD-10-CM | POA: Diagnosis not present

## 2020-02-01 DIAGNOSIS — J690 Pneumonitis due to inhalation of food and vomit: Secondary | ICD-10-CM | POA: Diagnosis not present

## 2020-02-01 LAB — CULTURE, BLOOD (ROUTINE X 2)
Culture: NO GROWTH
Special Requests: ADEQUATE

## 2020-02-01 LAB — GLUCOSE, CAPILLARY
Glucose-Capillary: 120 mg/dL — ABNORMAL HIGH (ref 70–99)
Glucose-Capillary: 121 mg/dL — ABNORMAL HIGH (ref 70–99)
Glucose-Capillary: 106 mg/dL — ABNORMAL HIGH (ref 70–99)
Glucose-Capillary: 155 mg/dL — ABNORMAL HIGH (ref 70–99)

## 2020-02-01 NOTE — TOC Progression Note (Addendum)
Transition of Care [facility]) - Progression Note    Patient Details  Name: NAVREET BOLDA MRN: [id number] Date of Birth: Jul 13, 1928  Transition of [facility] CM/SW [place], [place] Phone Number: 02/01/2020, 3:42 PM  Clinical Narrative:    [facility] insurance Authorization pending. CSW faxed requested updated PT notes for review. Currently being reviewed by the medical director of [facility].   CSW discussed alternative plan of care with the daughter if [facility] does not  approve days. Daughter reports SNF-[facility] and her lawyer has been working on [employer].  CSW reached out to SNF to determine if the facility will allow the patient to return pending medicaid. CSW waiting on a response.    Expected Discharge Plan: [place] Barriers to Discharge: Insurance Authorization  Expected Discharge Plan and Services Expected Discharge Plan: [place] In-house Referral: NA Discharge Planning Services: CM Consult Post Acute Care Choice: [place] Living arrangements for the past 2 months: [place], Single Family Home Expected Discharge Date: 01/31/20                                     Social Determinants of Health (SDOH) Interventions    Readmission Risk Interventions No flowsheet data found.

## 2020-02-01 NOTE — Progress Notes (Signed)
PROGRESS NOTE    [name]  NFA:[id number] DOB: July 05, 1928 DOA: 01/27/2020 PCP: [name], MD   Brief Narrative: [name] is a 86 y.o. female with history of coronary artery disease status post CABG, complete heart block status post pacemaker, CVA with residual right-sided deficit, dysphagia, status post PEG placement, CKD stage IIIb, diabetes type 2. Patient presented secondary to concern for aspiration pneumonia from her rehab facility. She has been managed empirically on antibiotics   Assessment & Plan:   Principal Problem:   Aspiration pneumonia ([place]) Active Problems:   Hypertension   Type 2 diabetes mellitus ([place])   Heart block   Acute kidney failure, unspecified ([place])   Stage 3b chronic kidney disease   Hypercalcemia   Hypernatremia   Dysphagia   Sepsis ([place])   Transaminitis   Aspiration pneumonia UTI CT chest significant for left lower lobe pneumonia concerning for aspiration. Urine culture significant for E. Coli and Strep Viridans -Continue Augmentin  Hypernatremia Secondary to dehydration. Improving slowly with free water -Continue free water boluses  Diabetes mellitus, type 2 Hemoglobin A1C of 8.6%. Patient is on Lantus 19 units BID as an outpatient -Continue Lantus 25 units daily and SSI  CKD stage IIIb Baseline creatinine of 1.3. No AKI noted.  Chronic normocytic Baseline hemoglobin of 10. Stable.  History of CVA History of CAD Right sided deficits and dysphagia. Has a PEG -Continue aspirin, Plavix, Lipitor  Right upper extremity swelling No DVT on venous duplex.  Sleep apnea -Continue CPAP qHS  Complete heart block S/p PPM.   DVT prophylaxis: Heparin subq Code Status:   Code Status: Full Code Family Communication: None Disposition Plan: Discharge to SNF when bed available   Consultants:   None  Procedures:    None  Antimicrobials:  Augmentin  Ceftriaxone  Vancomycin  Cefepime  Azithromycin  Unasyn   Subjective: No pain or concerns today.  Objective: Vitals:   01/31/20 2223 02/01/20 0243 02/01/20 0510 02/01/20 1348  BP: (!) 161/62 (!) 130/58 (!) 144/53 (!) 150/60  Pulse: 79 78 73 79  Resp:   20 18  Temp:   98.7 F (37.1 C) 98.6 F (37 C)  TempSrc:   Oral Oral  SpO2:   96% 96%  Weight:      Height:        Intake/Output Summary (Last 24 hours) at 02/01/2020 1756 Last data filed at 02/01/2020 1414 Gross per 24 hour  Intake 610 ml  Output 1000 ml  Net -390 ml   Filed Weights   01/27/20 2242  Weight: 74.9 kg    Examination:  General exam: Appears calm and comfortable Respiratory system: Clear to auscultation. Respiratory effort normal. Cardiovascular system: S1 & S2 heard, RRR. No murmurs, rubs, gallops or clicks. Gastrointestinal system: Abdomen is nondistended, soft and nontender. No organomegaly or masses felt. Normal bowel sounds heard. Central nervous system: Alert. Answers questions with one word Extremities: No edema. No calf tenderness Skin: No cyanosis. No rashes Psychiatry: Judgement and insight appear normal. Mood & affect appropriate.     Data Reviewed: I have personally reviewed following labs and imaging studies  CBC: Recent Labs  Lab 01/27/20 1453 01/28/20 0321 01/29/20 0325 01/30/20 0331 01/31/20 0318  WBC 15.6* 21.3* 15.8* 12.5* 10.2  NEUTROABS 12.1*  --  12.2* 8.8* 6.8  HGB 11.5* 10.1* 10.3* 9.8* 10.1*  HCT 38.7 34.4* 35.1* 33.3* 33.8*  MCV 95.8 94.8 96.7 94.6 93.9  PLT 214 251 243 182 469   Basic Metabolic  Panel: Recent Labs  Lab 01/27/20 1453 01/28/20 0321 01/29/20 0325 01/30/20 0331 01/31/20 0318  NA 150* 152* 149* 149* 148*  K 4.6 3.9 3.6 3.1* 3.9  CL 110 117* 116* 113* 112*  CO2 29 27 25 27 29   GLUCOSE 424* 276* 401* 182* 186*  BUN 43* 34* 27* 21 15  CREATININE 1.21* 0.89 0.87 0.73 0.71  CALCIUM 11.1* 10.3 9.9  9.4 10.0   GFR: Estimated Creatinine Clearance: 50.3 mL/min (by C-G formula based on SCr of 0.71 mg/dL). Liver Function Tests: Recent Labs  Lab 01/27/20 1453 01/28/20 0321  AST 62* 28  ALT 78* 53*  ALKPHOS 89 77  BILITOT 0.5 0.7  PROT 7.8 6.8  ALBUMIN 3.4* 3.0*   No results for input(s): LIPASE, AMYLASE in the last 168 hours. Recent Labs  Lab 01/27/20 1817  AMMONIA 22   Coagulation Profile: Recent Labs  Lab 01/27/20 1454  INR 1.1   Cardiac Enzymes: No results for input(s): CKTOTAL, CKMB, CKMBINDEX, TROPONINI in the last 168 hours. BNP (last 3 results) No results for input(s): PROBNP in the last 8760 hours. HbA1C: No results for input(s): HGBA1C in the last 72 hours. CBG: Recent Labs  Lab 01/31/20 1651 01/31/20 2036 02/01/20 0719 02/01/20 1151 02/01/20 1714  GLUCAP 153* 103* 120* 121* 106*   Lipid Profile: No results for input(s): CHOL, HDL, LDLCALC, TRIG, CHOLHDL, LDLDIRECT in the last 72 hours. Thyroid Function Tests: No results for input(s): TSH, T4TOTAL, FREET4, T3FREE, THYROIDAB in the last 72 hours. Anemia Panel: No results for input(s): VITAMINB12, FOLATE, FERRITIN, TIBC, IRON, RETICCTPCT in the last 72 hours. Sepsis Labs: Recent Labs  Lab 01/27/20 1333  LATICACIDVEN 1.8    Recent Results (from the past 240 hour(s))  Blood Culture (routine x 2)     Status: None   Collection Time: 01/27/20  2:53 PM   Specimen: BLOOD  Result Value Ref Range Status   Specimen Description BLOOD RIGHT ANTECUBITAL  Final   Special Requests   Final    BOTTLES DRAWN AEROBIC AND ANAEROBIC Blood Culture adequate volume Performed at [facility] [address], [place], [place] [address]    Culture NO GROWTH 5 DAYS  Final   Report Status 02/01/2020 FINAL  Final  Urine culture     Status: Abnormal   Collection Time: 01/27/20  5:39 PM   Specimen: In/Out Cath Urine  Result Value Ref Range Status   Specimen Description   Final    IN/OUT CATH  URINE Performed at [facility] [address], [place], [place] [address]    Special Requests   Final    NONE Performed at [facility], [place] [address], [place], [place] [address]    Culture (A)  Final    >=100,000 COLONIES/mL ESCHERICHIA COLI >=100,000 COLONIES/mL VIRIDANS STREPTOCOCCUS Standardized susceptibility testing for this organism is not available. Performed at [facility], [place] [address], [place], [place] [address]    Report Status 01/30/2020 FINAL  Final   Organism ID, Bacteria ESCHERICHIA COLI (A)  Final      Susceptibility   Escherichia coli - MIC*    AMPICILLIN 8 SENSITIVE Sensitive     CEFAZOLIN <=4 SENSITIVE Sensitive     CEFTRIAXONE <=0.25 SENSITIVE Sensitive     CIPROFLOXACIN <=0.25 SENSITIVE Sensitive     GENTAMICIN <=1 SENSITIVE Sensitive     IMIPENEM <=0.25 SENSITIVE Sensitive     NITROFURANTOIN <=16 SENSITIVE Sensitive     TRIMETH/SULFA <=20 SENSITIVE  Sensitive     AMPICILLIN/SULBACTAM <=2 SENSITIVE Sensitive     PIP/TAZO <=4 SENSITIVE Sensitive     * >=100,000 COLONIES/mL ESCHERICHIA COLI  SARS CORONAVIRUS 2 (TAT 6-24 HRS) Nasopharyngeal Nasopharyngeal Swab     Status: None   Collection Time: 01/27/20  6:10 PM   Specimen: Nasopharyngeal Swab  Result Value Ref Range Status   SARS Coronavirus 2 NEGATIVE NEGATIVE Final    Comment: (NOTE) SARS-CoV-2 target nucleic acids are NOT DETECTED. The SARS-CoV-2 RNA is generally detectable in upper and lower respiratory specimens during the acute phase of infection. Negative results do not preclude SARS-CoV-2 infection, do not rule out co-infections with other pathogens, and should not be used as the sole basis for treatment or other patient management decisions. Negative results must be combined with clinical observations, patient history, and epidemiological information. The expected result is Negative. Fact Sheet for  Patients: SugarRoll.be Fact Sheet for Healthcare Providers: https://www.woods-mathews.com/ This test is not yet approved or cleared by the [place] FDA and  has been authorized for detection and/or diagnosis of SARS-CoV-2 by FDA under an Emergency Use Authorization (EUA). This EUA will remain  in effect (meaning this test can be used) for the duration of the COVID-19 declaration under Section 56 4(b)(1) of the Act, 21 U.S.C. section 360bbb-3(b)(1), unless the authorization is terminated or revoked sooner. Performed at [facility], [place] [address] [place] [address], [place], [place] [address]   MRSA PCR Screening     Status: None   Collection Time: 01/27/20  7:29 PM   Specimen: Nasal Mucosa; Nasopharyngeal  Result Value Ref Range Status   MRSA by PCR NEGATIVE NEGATIVE Final    Comment:        The GeneXpert MRSA Assay (FDA approved for NASAL specimens only), is one component of a comprehensive MRSA colonization surveillance program. It is not intended to diagnose MRSA infection nor to guide or monitor treatment for MRSA infections. Performed at [facility], [place] [address], [place], [place] [address]   Blood Culture (routine x 2)     Status: None (Preliminary result)   Collection Time: 01/27/20  9:18 PM   Specimen: BLOOD RIGHT HAND  Result Value Ref Range Status   Specimen Description BLOOD RIGHT HAND  Final   Special Requests   Final    BOTTLES DRAWN AEROBIC ONLY Blood Culture adequate volume Performed at [facility] [address], [place], [place] [address]    Culture NO GROWTH 4 DAYS  Final   Report Status PENDING  Incomplete  SARS CORONAVIRUS 2 (TAT 6-24 HRS) Nasopharyngeal Nasopharyngeal Swab     Status: None   Collection Time: 01/30/20  1:39 PM   Specimen: Nasopharyngeal Swab  Result Value Ref Range Status   SARS Coronavirus 2 NEGATIVE NEGATIVE Final    Comment: (NOTE) SARS-CoV-2 target  nucleic acids are NOT DETECTED. The SARS-CoV-2 RNA is generally detectable in upper and lower respiratory specimens during the acute phase of infection. Negative results do not preclude SARS-CoV-2 infection, do not rule out co-infections with other pathogens, and should not be used as the sole basis for treatment or other patient management decisions. Negative results must be combined with clinical observations, patient history, and epidemiological information. The expected result is Negative. Fact Sheet for Patients: SugarRoll.be Fact Sheet for Healthcare Providers: https://www.woods-mathews.com/ This test is not yet approved or cleared by the [place] FDA and  has been authorized for detection and/or diagnosis of SARS-CoV-2 by FDA under an Emergency Use  Authorization (EUA). This EUA will remain  in effect (meaning this test can be used) for the duration of the COVID-19 declaration under Section 56 4(b)(1) of the Act, 21 U.S.C. section 360bbb-3(b)(1), unless the authorization is terminated or revoked sooner. Performed at [facility], [place] [address], [place], [place] [address]          Radiology Studies: No results found.      Scheduled Meds: . amLODipine  10 mg Per Tube Daily  . amoxicillin-clavulanate  1 tablet Per Tube Q12H  . aspirin  81 mg Per J Tube Daily  . atorvastatin  80 mg Per Tube q morning - 10a  . carvedilol  3.125 mg Per Tube BID  . clopidogrel  75 mg Per Tube Daily  . donepezil  10 mg Per Tube QHS  . feeding supplement (PRO-STAT SUGAR FREE 64)  30 mL Per Tube Daily  . ferrous sulfate  300 mg Per Tube Q breakfast  . free water  300 mL Per Tube Q6H  . heparin  5,000 Units Subcutaneous Q8H  . insulin aspart  0-15 Units Subcutaneous TID WC  . insulin aspart  10 Units Subcutaneous TID WC  . insulin glargine  25 Units Subcutaneous BID  . pantoprazole sodium  40 mg Per Tube QAC breakfast  . polyethylene  glycol  17 g Per Tube BID  . scopolamine  1 patch Transdermal Q72H  . senna-docusate  1 tablet Per Tube BID   Continuous Infusions: . sodium chloride    . feeding supplement (JEVITY 1.2 CAL) 1,000 mL (02/01/20 0433)     LOS: 5 days     Cordelia Poche, MD [facility] 02/01/2020, 5:56 PM  If 7PM-7AM, please contact night-coverage [email or web address]

## 2020-02-02 DIAGNOSIS — R1310 Dysphagia, unspecified: Secondary | ICD-10-CM | POA: Diagnosis not present

## 2020-02-02 DIAGNOSIS — J690 Pneumonitis due to inhalation of food and vomit: Secondary | ICD-10-CM | POA: Diagnosis not present

## 2020-02-02 DIAGNOSIS — I459 Conduction disorder, unspecified: Secondary | ICD-10-CM | POA: Diagnosis not present

## 2020-02-02 LAB — BASIC METABOLIC PANEL
Anion gap: 6 (ref 5–15)
BUN: 12 mg/dL (ref 8–23)
CO2: 31 mmol/L (ref 22–32)
Calcium: 10.1 mg/dL (ref 8.9–10.3)
Chloride: 104 mmol/L (ref 98–111)
Creatinine, Ser: 0.82 mg/dL (ref 0.44–1.00)
GFR calc Af Amer: >60 mL/min (ref >60)
GFR calc non Af Amer: >60 mL/min (ref >60)
Glucose, Bld: 124 mg/dL — ABNORMAL HIGH (ref 70–99)
Potassium: 3.2 mmol/L — ABNORMAL LOW (ref 3.5–5.1)
Sodium: 141 mmol/L (ref 135–145)

## 2020-02-02 LAB — GLUCOSE, CAPILLARY
Glucose-Capillary: 158 mg/dL — ABNORMAL HIGH (ref 70–99)
Glucose-Capillary: 117 mg/dL — ABNORMAL HIGH (ref 70–99)
Glucose-Capillary: 108 mg/dL — ABNORMAL HIGH (ref 70–99)
Glucose-Capillary: 130 mg/dL — ABNORMAL HIGH (ref 70–99)

## 2020-02-02 LAB — CULTURE, BLOOD (ROUTINE X 2)
Culture: NO GROWTH
Special Requests: ADEQUATE

## 2020-02-02 MED ORDER — POTASSIUM CHLORIDE CRYS ER 20 MEQ PO TBCR: 40.0000 meq | EXTENDED_RELEASE_TABLET | Freq: Once | ORAL | Status: DC

## 2020-02-02 MED ORDER — POTASSIUM CHLORIDE 20 MEQ/15ML (10%) PO SOLN
40.0000 meq | Freq: Once | ORAL | Status: AC
  Administered 2020-02-02: 40 meq
  Filled 2020-02-02: qty 30

## 2020-02-02 NOTE — Progress Notes (Signed)
Physical Therapy Treatment Patient Details Name: [name] MRN: [id number] DOB: Oct 05, 1928 Today's Date: 02/02/2020    History of Present Illness Pt is a 86 y.o. F with significant PMH of CHF, DM, CAD with pacemaker, HTN, prior stroke, who presents with sepsis 2* aspiration pna.      PT Comments    Pt assisted with exercises in supine.  Pt requiring total assist for bed mobility however once sitting EOB today, pt was able to maintain upright posture for brief periods without trunk support.  Pt also able to perform small reaching activities with L UE in sitting.  Pt more awake and alert this session upon sitting EOB.  Pt is following simples commands and participating as able.  Continue to recommend SNF upon d/c.    Follow Up Recommendations  SNF     Equipment Recommendations  None recommended by PT    Recommendations for Other Services       Precautions / Restrictions Precautions Precautions: Fall;Other (comment);ICD/Pacemaker Precaution Comments: hx aphasia from CVA    Mobility  Bed Mobility Overal bed mobility: Needs Assistance Bed Mobility: Supine to Sit;Sit to Supine     Supine to sit: Total assist;+2 for physical assistance Sit to supine: Total assist;+2 for physical assistance   General bed mobility comments: pt with very weak extremities (hx of R sided deficits) not able to physical assist with bed mobility so required total assist  Transfers                    Ambulation/Gait                 Stairs             Wheelchair Mobility    Modified Rankin (Stroke Patients Only)       Balance Overall balance assessment: Needs assistance Sitting-balance support: Feet supported;Single extremity supported Sitting balance-Leahy Scale: Fair Sitting balance - Comments: pt able to sit upright posture for approx 3 minutes at a time prior to fatigue, also performed small reaching for therapist's hand with patient's left arm                                     Cognition Arousal/Alertness: Awake/alert Behavior During Therapy: Flat affect Overall Cognitive Status: History of cognitive impairments - at baseline                                 General Comments: pt attempting to participate as able, will answer yes/no with time, much more alert with sitting EOB      Exercises General Exercises - Upper Extremity Shoulder Flexion: AAROM;Both;5 reps Shoulder ABduction: PROM;5 reps;Right Elbow Flexion: AAROM;10 reps;Both Wrist Flexion: AAROM;Left;5 reps Digit Composite Flexion: AROM;Left;10 reps Composite Extension: AAROM;10 reps;Left General Exercises - Lower Extremity Ankle Circles/Pumps: AROM;Left;10 reps Quad Sets: AAROM;Left;10 reps Heel Slides: PROM;AAROM;Both;Limitations;10 reps Heel Slides Limitations: all exercises with R LE passive Hip ABduction/ADduction: AAROM;PROM;Both;10 reps    General Comments General comments (skin integrity, edema, etc.): Pt requiring cues to attend to looking in right direction (posture has been left lateral lean of neck and trunk in supine - repositioned end of session to more neutral and Prevalon boot reapplied)      Pertinent Vitals/Pain Pain Assessment: No/denies pain    Home Living  Prior Function            PT Goals (current goals can now be found in the care plan section) Progress towards PT goals: Progressing toward goals    Frequency    Min 2X/week      PT Plan Current plan remains appropriate    Co-evaluation              AM-PAC PT "6 Clicks" Mobility   Outcome Measure  Help needed turning from your back to your side while in a flat bed without using bedrails?: Total Help needed moving from lying on your back to sitting on the side of a flat bed without using bedrails?: Total Help needed moving to and from a bed to a chair (including a wheelchair)?: Total Help needed standing up from a chair  using your arms (e.g., wheelchair or bedside chair)?: Total Help needed to walk in hospital room?: Total Help needed climbing 3-5 steps with a railing? : Total 6 Click Score: 6    End of Session   Activity Tolerance: Patient tolerated treatment well Patient left: in bed;with call bell/phone within reach(bed alarm not working on arrival)   PT Visit Diagnosis: Muscle weakness (generalized) (M62.81);Difficulty in walking, not elsewhere classified (R26.2);Hemiplegia and hemiparesis Hemiplegia - Right/Left: Right Hemiplegia - dominant/non-dominant: Dominant Hemiplegia - caused by: Cerebral infarction     Time: 4680-3212 PT Time Calculation (min) (ACUTE ONLY): 26 min  Charges:  $Therapeutic Exercise: 8-22 mins $Therapeutic Activity: 8-22 mins                    Arlyce Dice, DPT Acute Rehabilitation Services Office: [phone]  Trena Platt 02/02/2020, 2:49 PM

## 2020-02-02 NOTE — Progress Notes (Signed)
PROGRESS NOTE    [name]  XBM:[id number] DOB: 1927/12/21 DOA: 01/27/2020 PCP: [name], MD   Brief Narrative: [name] is a 86 y.o. female with history of coronary artery disease status post CABG, complete heart block status post pacemaker, CVA with residual right-sided deficit, dysphagia, status post PEG placement, CKD stage IIIb, diabetes type 2. Patient presented secondary to concern for aspiration pneumonia from her rehab facility. She has been managed empirically on antibiotics   Assessment & Plan:   Principal Problem:   Aspiration pneumonia ([place]) Active Problems:   Hypertension   Type 2 diabetes mellitus ([place])   Heart block   Acute kidney failure, unspecified ([place])   Stage 3b chronic kidney disease   Hypercalcemia   Hypernatremia   Dysphagia   Sepsis ([place])   Transaminitis   Aspiration pneumonia UTI CT chest significant for left lower lobe pneumonia concerning for aspiration. Urine culture significant for E. Coli and Strep Viridans -Continue Augmentin  Hypernatremia Secondary to dehydration. Resolved. -Continue free water boluses  Diabetes mellitus, type 2 Hemoglobin A1C of 8.6%. Patient is on Lantus 19 units BID as an outpatient -Continue Lantus 25 units daily and SSI  CKD stage IIIb Baseline creatinine of 1.3. No AKI noted on chart review.  Chronic normocytic Baseline hemoglobin of 10. Stable.  History of CVA History of CAD Right sided deficits and dysphagia. Has a PEG -Continue aspirin, Plavix, Lipitor  Right upper extremity swelling No DVT on venous duplex.  Sleep apnea -Continue CPAP qHS  Complete heart block S/p PPM.   DVT prophylaxis: Heparin subq Code Status:   Code Status: Full Code Family Communication: None Disposition Plan: Discharge to SNF when bed available. Medically stable for discharge.   Consultants:   None  Procedures:    None  Antimicrobials:  Augmentin  Ceftriaxone  Vancomycin  Cefepime  Azithromycin  Unasyn   Subjective: No issues today.  Objective: Vitals:   02/01/20 0510 02/01/20 1348 02/01/20 2220 02/02/20 0602  BP: (!) 144/53 (!) 150/60 (!) 146/61 131/61  Pulse: 73 79 79 77  Resp: 20 18 18 17   Temp: 98.7 F (37.1 C) 98.6 F (37 C) 99.2 F (37.3 C) 97.7 F (36.5 C)  TempSrc: Oral Oral Axillary   SpO2: 96% 96% 98% 97%  Weight:      Height:        Intake/Output Summary (Last 24 hours) at 02/02/2020 1531 Last data filed at 02/02/2020 0900 Gross per 24 hour  Intake 960 ml  Output 1002 ml  Net -42 ml   Filed Weights   01/27/20 2242  Weight: 74.9 kg    Examination:  General exam: Appears calm and comfortable Respiratory system: Clear to auscultation. Respiratory effort normal. Cardiovascular system: S1 & S2 heard, RRR. No murmurs, rubs, gallops or clicks. Gastrointestinal system: Abdomen is nondistended, soft and nontender. No organomegaly or masses felt. Decreased bowel sounds heard. Extremities: No edema. No calf tenderness Skin: No cyanosis. No rashes   Data Reviewed: I have personally reviewed following labs and imaging studies  CBC: Recent Labs  Lab 01/27/20 1453 01/28/20 0321 01/29/20 0325 01/30/20 0331 01/31/20 0318  WBC 15.6* 21.3* 15.8* 12.5* 10.2  NEUTROABS 12.1*  --  12.2* 8.8* 6.8  HGB 11.5* 10.1* 10.3* 9.8* 10.1*  HCT 38.7 34.4* 35.1* 33.3* 33.8*  MCV 95.8 94.8 96.7 94.6 93.9  PLT 214 251 243 182 027   Basic Metabolic Panel: Recent Labs  Lab 01/27/20 1453 01/28/20 0321 01/29/20 0325 01/30/20 0331 01/31/20 2536  NA 150* 152* 149* 149* 148*  K 4.6 3.9 3.6 3.1* 3.9  CL 110 117* 116* 113* 112*  CO2 29 27 25 27 29   GLUCOSE 424* 276* 401* 182* 186*  BUN 43* 34* 27* 21 15  CREATININE 1.21* 0.89 0.87 0.73 0.71  CALCIUM 11.1* 10.3 9.9 9.4 10.0   GFR: Estimated Creatinine Clearance: 50.3 mL/min (by C-G formula based on SCr of 0.71  mg/dL). Liver Function Tests: Recent Labs  Lab 01/27/20 1453 01/28/20 0321  AST 62* 28  ALT 78* 53*  ALKPHOS 89 77  BILITOT 0.5 0.7  PROT 7.8 6.8  ALBUMIN 3.4* 3.0*   No results for input(s): LIPASE, AMYLASE in the last 168 hours. Recent Labs  Lab 01/27/20 1817  AMMONIA 22   Coagulation Profile: Recent Labs  Lab 01/27/20 1454  INR 1.1   Cardiac Enzymes: No results for input(s): CKTOTAL, CKMB, CKMBINDEX, TROPONINI in the last 168 hours. BNP (last 3 results) No results for input(s): PROBNP in the last 8760 hours. HbA1C: No results for input(s): HGBA1C in the last 72 hours. CBG: Recent Labs  Lab 02/01/20 1151 02/01/20 1714 02/01/20 2218 02/02/20 0728 02/02/20 1146  GLUCAP 121* 106* 155* 158* 117*   Lipid Profile: No results for input(s): CHOL, HDL, LDLCALC, TRIG, CHOLHDL, LDLDIRECT in the last 72 hours. Thyroid Function Tests: No results for input(s): TSH, T4TOTAL, FREET4, T3FREE, THYROIDAB in the last 72 hours. Anemia Panel: No results for input(s): VITAMINB12, FOLATE, FERRITIN, TIBC, IRON, RETICCTPCT in the last 72 hours. Sepsis Labs: Recent Labs  Lab 01/27/20 1333  LATICACIDVEN 1.8    Recent Results (from the past 240 hour(s))  Blood Culture (routine x 2)     Status: None   Collection Time: 01/27/20  2:53 PM   Specimen: BLOOD  Result Value Ref Range Status   Specimen Description BLOOD RIGHT ANTECUBITAL  Final   Special Requests   Final    BOTTLES DRAWN AEROBIC AND ANAEROBIC Blood Culture adequate volume Performed at [facility] [address], [place], [place] [address]    Culture NO GROWTH 5 DAYS  Final   Report Status 02/01/2020 FINAL  Final  Urine culture     Status: Abnormal   Collection Time: 01/27/20  5:39 PM   Specimen: In/Out Cath Urine  Result Value Ref Range Status   Specimen Description   Final    IN/OUT CATH URINE Performed at [facility] [address] [place], [place] [address]     Special Requests   Final    NONE Performed at [facility], [place] [address] [place], [place] [address]    Culture (A)  Final    >=100,000 COLONIES/mL ESCHERICHIA COLI >=100,000 COLONIES/mL VIRIDANS STREPTOCOCCUS Standardized susceptibility testing for this organism is not available. Performed at [facility], [place] [address] [place], [place] [address]    Report Status 01/30/2020 FINAL  Final   Organism ID, Bacteria ESCHERICHIA COLI (A)  Final      Susceptibility   Escherichia coli - MIC*    AMPICILLIN 8 SENSITIVE Sensitive     CEFAZOLIN <=4 SENSITIVE Sensitive     CEFTRIAXONE <=0.25 SENSITIVE Sensitive     CIPROFLOXACIN <=0.25 SENSITIVE Sensitive     GENTAMICIN <=1 SENSITIVE Sensitive     IMIPENEM <=0.25 SENSITIVE Sensitive     NITROFURANTOIN <=16 SENSITIVE Sensitive     TRIMETH/SULFA <=20 SENSITIVE Sensitive     AMPICILLIN/SULBACTAM <=2 SENSITIVE Sensitive     PIP/TAZO <=4 SENSITIVE  Sensitive     * >=100,000 COLONIES/mL ESCHERICHIA COLI  SARS CORONAVIRUS 2 (TAT 6-24 HRS) Nasopharyngeal Nasopharyngeal Swab     Status: None   Collection Time: 01/27/20  6:10 PM   Specimen: Nasopharyngeal Swab  Result Value Ref Range Status   SARS Coronavirus 2 NEGATIVE NEGATIVE Final    Comment: (NOTE) SARS-CoV-2 target nucleic acids are NOT DETECTED. The SARS-CoV-2 RNA is generally detectable in upper and lower respiratory specimens during the acute phase of infection. Negative results do not preclude SARS-CoV-2 infection, do not rule out co-infections with other pathogens, and should not be used as the sole basis for treatment or other patient management decisions. Negative results must be combined with clinical observations, patient history, and epidemiological information. The expected result is Negative. Fact Sheet for Patients: SugarRoll.be Fact Sheet for Healthcare Providers: https://www.woods-mathews.com/ This test is  not yet approved or cleared by the [place] FDA and  has been authorized for detection and/or diagnosis of SARS-CoV-2 by FDA under an Emergency Use Authorization (EUA). This EUA will remain  in effect (meaning this test can be used) for the duration of the COVID-19 declaration under Section 56 4(b)(1) of the Act, 21 U.S.C. section 360bbb-3(b)(1), unless the authorization is terminated or revoked sooner. Performed at [facility], [place] [address] [place], [place] [address]   MRSA PCR Screening     Status: None   Collection Time: 01/27/20  7:29 PM   Specimen: Nasal Mucosa; Nasopharyngeal  Result Value Ref Range Status   MRSA by PCR NEGATIVE NEGATIVE Final    Comment:        The GeneXpert MRSA Assay (FDA approved for NASAL specimens only), is one component of a comprehensive MRSA colonization surveillance program. It is not intended to diagnose MRSA infection nor to guide or monitor treatment for MRSA infections. Performed at [facility], [place] [address] [place], [place] [address]   Blood Culture (routine x 2)     Status: None   Collection Time: 01/27/20  9:18 PM   Specimen: BLOOD RIGHT HAND  Result Value Ref Range Status   Specimen Description   Final    BLOOD RIGHT HAND Performed at [facility] [address] [place], [place] [address]    Special Requests   Final    BOTTLES DRAWN AEROBIC ONLY Blood Culture adequate volume Performed at [facility] [address] [place], [place] [address]    Culture   Final    NO GROWTH 5 DAYS Performed at [facility], [place] [address], [place],  [address]    Report Status 02/02/2020 FINAL  Final  SARS CORONAVIRUS 2 (TAT 6-24 HRS) Nasopharyngeal Nasopharyngeal Swab     Status: None   Collection Time: 01/30/20  1:39 PM   Specimen: Nasopharyngeal Swab  Result Value Ref Range Status   SARS Coronavirus 2 NEGATIVE NEGATIVE Final    Comment:  (NOTE) SARS-CoV-2 target nucleic acids are NOT DETECTED. The SARS-CoV-2 RNA is generally detectable in upper and lower respiratory specimens during the acute phase of infection. Negative results do not preclude SARS-CoV-2 infection, do not rule out co-infections with other pathogens, and should not be used as the sole basis for treatment or other patient management decisions. Negative results must be combined with clinical observations, patient history, and epidemiological information. The expected result is Negative. Fact Sheet for Patients: SugarRoll.be Fact Sheet for Healthcare Providers: https://www.woods-mathews.com/ This test is not yet approved or cleared by the [place]  States FDA and  has been authorized for detection and/or diagnosis of SARS-CoV-2 by FDA under an Emergency Use Authorization (EUA). This EUA will remain  in effect (meaning this test can be used) for the duration of the COVID-19 declaration under Section 56 4(b)(1) of the Act, 21 U.S.C. section 360bbb-3(b)(1), unless the authorization is terminated or revoked sooner. Performed at [facility], [place] [address], [place], [place] [address]          Radiology Studies: No results found.      Scheduled Meds: . amLODipine  10 mg Per Tube Daily  . amoxicillin-clavulanate  1 tablet Per Tube Q12H  . aspirin  81 mg Per J Tube Daily  . atorvastatin  80 mg Per Tube q morning - 10a  . carvedilol  3.125 mg Per Tube BID  . clopidogrel  75 mg Per Tube Daily  . donepezil  10 mg Per Tube QHS  . feeding supplement (PRO-STAT SUGAR FREE 64)  30 mL Per Tube Daily  . ferrous sulfate  300 mg Per Tube Q breakfast  . free water  300 mL Per Tube Q6H  . heparin  5,000 Units Subcutaneous Q8H  . insulin aspart  0-15 Units Subcutaneous TID WC  . insulin aspart  10 Units Subcutaneous TID WC  . insulin glargine  25 Units Subcutaneous BID  . pantoprazole sodium  40 mg Per Tube QAC  breakfast  . polyethylene glycol  17 g Per Tube BID  . scopolamine  1 patch Transdermal Q72H  . senna-docusate  1 tablet Per Tube BID   Continuous Infusions: . feeding supplement (JEVITY 1.2 CAL) 1,000 mL (02/01/20 0433)     LOS: 6 days     Cordelia Poche, MD [facility] 02/02/2020, 3:31 PM  If 7PM-7AM, please contact night-coverage [email or web address]

## 2020-02-02 NOTE — TOC Progression Note (Signed)
Transition of Care [facility]) - Progression Note    Patient Details  Name: [name] MRN: [id number] Date of Birth: Dec 27, 1927  Transition of Care [facility]) CM/SW [place], [place] Phone Number: 02/02/2020, 4:16 PM  Clinical Narrative:    Peer to Peer completed, waiting on a final decision from [place] director Dr. Billey Chang.  Patient daughter updated.    Expected Discharge Plan: [place] Barriers to Discharge: Insurance Authorization  Expected Discharge Plan and Services Expected Discharge Plan: [place] In-house Referral: NA Discharge Planning Services: CM Consult Post Acute Care Choice: [place] Living arrangements for the past 2 months: [place], Single Family Home Expected Discharge Date: 02/03/20                                     Social Determinants of Health (SDOH) Interventions    Readmission Risk Interventions No flowsheet data found.

## 2020-02-02 NOTE — Care Management Important Message (Signed)
Important Message  Patient Details IM Letter given to Kathrin Greathouse SW Case Manager to present to the Patient Name: [name] MRN: [id number] Date of Birth: 03/17/1928   Medicare Important Message Given:  Yes     Kerin Salen 02/02/2020, 11:57 AM

## 2020-02-02 NOTE — Progress Notes (Signed)
Patient oral care with suction performed by this RN. Tolerated well- resting comfortably in bed.

## 2020-02-03 DIAGNOSIS — J690 Pneumonitis due to inhalation of food and vomit: Secondary | ICD-10-CM | POA: Diagnosis not present

## 2020-02-03 LAB — SARS CORONAVIRUS 2 (TAT 6-24 HRS): SARS Coronavirus 2: NEGATIVE

## 2020-02-03 LAB — GLUCOSE, CAPILLARY
Glucose-Capillary: 146 mg/dL — ABNORMAL HIGH (ref 70–99)
Glucose-Capillary: 167 mg/dL — ABNORMAL HIGH (ref 70–99)
Glucose-Capillary: 173 mg/dL — ABNORMAL HIGH (ref 70–99)
Glucose-Capillary: 144 mg/dL — ABNORMAL HIGH (ref 70–99)

## 2020-02-03 NOTE — Progress Notes (Signed)
Nutrition Follow-up  DOCUMENTATION CODES:   Not applicable  INTERVENTION:  - recommend increase TF to goal rate of Jevity 1.2 @ 60 ml/hr with 30 ml prostat once/day, and 100 ml free water every 4 hours. - this regimen provides 1828 kcal, 95 grams protein, and 1762 ml free water.   NUTRITION DIAGNOSIS:   Inadequate oral intake related to inability to eat, dysphagia as evidenced by NPO status. -ongoing  GOAL:   Patient will meet greater than or equal to 90% of their needs -to be met with TF  MONITOR:   TF tolerance, Labs, Weight trends, Skin  REASON FOR ASSESSMENT:   Consult Enteral/tube feeding initiation and management  ASSESSMENT:   Patient with PMH significant for CAD s/p CABG, complete heart block s/p pacemaker, CVA with residual right sided deficit and dysphagia, PEG tube dependent, CKD, and DM. Presents this admission with aspiration PNA and questionable UTI.  Patient remains NPO. She has not been weighed since admission on 4/9. She is a/o to self only. Patient laying in bed with no family/visitors at bedside.   She has PEG in place and is currently receiving Jevity 1.2 @ 30 ml/hr with 30 ml prostat once/day and 300 ml free water QID. This provides 964 kcal, 55 grams protein, and 1781 ml free water.   Patient is to discharge to SNF today. Discharge order and discharge summary have been entered.    Labs reviewed; CBGs: 146 and 167 mg/dl, K: 3.2 mmol/l. Medications reviewed; 300 mg ferrous sulfate/day, sliding scale novolog, 10 units novolog TID, 25 units lantus BID, 40 mg protonix per tube/day, 1 packet miralax BID, 40 mEq KCl per tube x1 dose 4/15, 1 tablet senokot BID.    NUTRITION - FOCUSED PHYSICAL EXAM:  completed; no muscle or fat wasting, mild edema to BLE.   Diet Order:   Diet Order            Diet - low sodium heart healthy        Diet NPO time specified  Diet effective now              EDUCATION NEEDS:   No education needs have been  identified at this time  Skin:  Skin Assessment: Skin Integrity Issues: Skin Integrity Issues:: DTI DTI: R heel  Last BM:  4/15  Height:   Ht Readings from Last 1 Encounters:  01/27/20 5' 7"  (1.702 m)    Weight:   Wt Readings from Last 1 Encounters:  01/27/20 74.9 kg    Ideal Body Weight:     BMI:  Body mass index is 25.86 kg/m.  Estimated Nutritional Needs:   Kcal:  1700-1900 kcal  Protein:  85-100 grams  Fluid:  >/= 1.7 L/day     Jarome Matin, MS, RD, LDN, CNSC Inpatient Clinical Dietitian RD pager # available in AMION  After hours/weekend pager # available in [facility]

## 2020-02-03 NOTE — Progress Notes (Signed)
covid test negative, snf informed.

## 2020-02-03 NOTE — Discharge Summary (Addendum)
Physician Discharge Summary  [name] QBH:[id number] DOB: 1928/08/13 DOA: 01/27/2020  PCP: Vicki Panda, MD  Admit date: 01/27/2020 Discharge date: 02/03/2020  Admitted From: SNF Disposition: SNF  Recommendations for Outpatient Follow-up:  1. Follow up with PCP in 1 week 2. Please follow up on the following pending results: None   Discharge Condition: Stable CODE STATUS: Full code Diet recommendation: Continuous tube feed  Goal: Jevity 1.2 @ 60 ml/hr with 30 ml prostat (or equivalent) once/day, and 100 ml free water every 4 hours   Brief/Interim Summary:  Admission HPI written by Vicki Desanctis, DO   Chief Complaint: Concerns for aspiration pneumonia  HPI: [name] is a 86 y.o. 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 Daughter at bedside provides the history as patient is mostly aphasic other than being able to answer yes or no.  Daughter reports that she was only told by the rehab center today that patient had vomiting 2 days ago and I was concerned today that she had aspiration pneumonia.  They did not tell her whether she was having fever or any respiratory distress.  Patient is PEG tube dependent and is NPO following a recent stroke in March. She is currently bed/wheelchair bound and only able to answer yes or no at baseline.   ED Course: She was febrile up to 100 point 21F, tachypneic requiring up to 2 L and was mildly hypertensive.  Labs were notable for WBC of 15.6, hemoglobin of 11.5 that appears hemoconcentrated compared to prior baseline of 9.  Sodium of 150, glucose of 424, creatinine of 1.21 from a prior of 0.75.  Calcium of 11.1, mildly elevated AST of 62 and ALT of 78.  UA shows large leukocyte and negative nitrite with many  bacteria. CTA chest showed no PE but had left lower lobe opacity concerning for aspiration pneumonia.    Hospital course:  Aspiration pneumonia UTI CT chest significant for left lower lobe pneumonia concerning for aspiration. Urine culture significant for E. Coli and Strep Viridans. Patient treated with Cefepime, then Unasyn and finally Augmentin. Completed course prior to discharge.  Hypernatremia Secondary to dehydration. Resolved.  Diabetes mellitus, type 2 Hemoglobin A1C of 8.6%. Discharging on Lantus 25 units BID  CKD stage IIIb Baseline creatinine of 1.3. No AKI noted on chart review.  Chronic normocytic Baseline hemoglobin of 10. Stable.  History of CVA History of CAD Right sided deficits and dysphagia. Has a PEG. Continue aspirin, Plavix, Lipitor  Right upper extremity swelling No DVT on venous duplex.  Sleep apnea Continue CPAP qHS  Complete heart block S/p PPM.  Discharge Diagnoses:  Principal Problem:   Aspiration pneumonia ([name]) Active Problems:   Hypertension   Type 2 diabetes mellitus ([name])   Heart block   Stage 3b chronic kidney disease   Hypercalcemia   Hypernatremia   Dysphagia   Sepsis ([place])   Transaminitis    Discharge Instructions  Discharge Instructions    Diet - low sodium heart healthy   Complete by: As directed    Discharge instructions   Complete by: As directed    1)Take prescribed medications as instructed. 2)Started tube feeding at continuous, at a slower rate, 40 m//hr per hour.Bolus feeding stopped. 3)Do a BMP test in a week. 4)Follow up with palliative care  as an outpatient.   Increase activity slowly   Complete by: As directed      Allergies as of 02/03/2020   No Known Allergies     Medication List    STOP taking these medications   lidocaine 5 % Commonly known as: LIDODERM     TAKE these medications   Acetaminophen Extra Strength 500 MG tablet Generic drug: acetaminophen Place 500 mg into feeding  tube every 12 (twelve) hours. What changed: Another medication with the same name was removed. Continue taking this medication, and follow the directions you see here.   acetaminophen 325 MG tablet Commonly known as: TYLENOL Place 650 mg into feeding tube every 4 (four) hours as needed (general discomfort). What changed: Another medication with the same name was removed. Continue taking this medication, and follow the directions you see here.   amLODipine 10 MG tablet Commonly known as: NORVASC Place 1 tablet (10 mg total) into feeding tube daily. What changed:   medication strength  how much to take   aspirin 81 MG chewable tablet 1 tablet (81 mg total) by Per J Tube route daily.   atorvastatin 80 MG tablet Commonly known as: LIPITOR Place 1 tablet (80 mg total) into feeding tube every morning.   brimonidine 0.2 % ophthalmic solution Commonly known as: ALPHAGAN Place 1 drop into both eyes 2 (two) times daily.   carvedilol 3.125 MG tablet Commonly known as: COREG Place 1 tablet (3.125 mg total) into feeding tube 2 (two) times daily.   clopidogrel 75 MG tablet Commonly known as: PLAVIX Place 1 tablet (75 mg total) into feeding tube daily.   donepezil 10 MG tablet Commonly known as: ARICEPT Place 1 tablet (10 mg total) into feeding tube at bedtime. What changed: when to take this   dorzolamide 2 % ophthalmic solution Commonly known as: TRUSOPT Place 1 drop into both eyes 3 (three) times daily.   feeding supplement (JEVITY 1.2 CAL) Liqd Place 1,000 mLs into feeding tube continuous. Continue at slow rate: 40 ml/hr What changed:   how much to take  when to take this  additional instructions   ferrous sulfate 220 (44 Fe) MG/5ML solution Take 308 mg by mouth daily. What changed: Another medication with the same name was removed. Continue taking this medication, and follow the directions you see here.   free water Soln Place 200 mLs into feeding tube 3 (three)  times daily. What changed: when to take this   insulin glargine 100 UNIT/ML injection Commonly known as: LANTUS Inject 0.25 mLs (25 Units total) into the skin 2 (two) times daily. What changed: how much to take   latanoprost 0.005 % ophthalmic solution Commonly known as: XALATAN Place 1 drop into both eyes at bedtime.   pantoprazole sodium 40 mg/20 mL Pack Commonly known as: PROTONIX Place 20 mLs (40 mg total) into feeding tube daily before breakfast.   polyethylene glycol 17 g packet Commonly known as: MIRALAX / GLYCOLAX Place 17 g into feeding tube 2 (two) times daily.   scopolamine 1 MG/3DAYS Commonly known as: TRANSDERM-SCOP Place 1 patch (1.5 mg total) onto the skin every 3 (three) days.   senna-docusate 8.6-50 MG tablet Commonly known as: Senokot-S Place 1 tablet into feeding tube 2 (two) times daily.   sertraline 25 MG tablet Commonly known as: ZOLOFT Place 25 mg into feeding tube daily.   traMADol 50 MG tablet Commonly known as: ULTRAM Place 1 tablet (50 mg total) into feeding tube every 12 (twelve) hours  as needed for severe pain.      Follow-up Information    Vicki Panda, MD. Schedule an appointment as soon as possible for a visit in 1 week(s).   Specialty: Internal Medicine Contact information: [phone] [place] [id number] [phone]          No Known Allergies  Consultations:  None   Procedures/Studies: DG Chest 1 View  Result Date: 01/30/2020 CLINICAL DATA:  Coronary artery disease EXAM: CHEST  1 VIEW COMPARISON:  01/27/2020 FINDINGS: Status post median sternotomy and CABG. Left chest multi lead pacer defibrillator. Both lungs are clear. The visualized skeletal structures are unremarkable. IMPRESSION: No acute abnormality of the lungs in AP portable projection. Electronically Signed   By: Eddie Candle M.D.   On: 01/30/2020 17:32   CT Head Wo Contrast  Result Date: 01/27/2020 CLINICAL DATA:  Encephalopathy. Insulin-dependent  diabetes, CHF. Altered mental status. Aspiration 2 nights ago. EXAM: CT HEAD WITHOUT CONTRAST TECHNIQUE: Contiguous axial images were obtained from the base of the skull through the vertex without intravenous contrast. COMPARISON:  12/15/2019 FINDINGS: Brain: There is central and cortical atrophy. Periventricular white matter changes are consistent with small vessel disease. There is no intra or extra-axial fluid collection or mass lesion. The basilar cisterns and ventricles have a normal appearance. There is no CT evidence for acute infarction or hemorrhage. Vascular: Atherosclerotic calcification of the internal carotid arteries. No hyperdense vessels. Skull: Normal. Negative for fracture or focal lesion. Sinuses/Orbits: There is minimal mucoperiosteal thickening of the maxillary sinuses bilaterally. No air-fluid levels. Other: None. IMPRESSION: 1. No evidence for acute intracranial abnormality. 2. Atrophy and small vessel disease. 3. Minimal chronic sinusitis. Electronically Signed   By: Nolon Nations M.D.   On: 01/27/2020 16:21   CT Angio Chest PE W/Cm &/Or Wo Cm  Result Date: 01/27/2020 CLINICAL DATA:  Altered mental status for 3 days. Possible aspiration. Fever. EXAM: CT ANGIOGRAPHY CHEST WITH CONTRAST TECHNIQUE: Multidetector CT imaging of the chest was performed using the standard protocol during bolus administration of intravenous contrast. Multiplanar CT image reconstructions and MIPs were obtained to evaluate the vascular anatomy. CONTRAST:  42m OMNIPAQUE IOHEXOL 350 MG/ML SOLN COMPARISON:  Chest radiographs 01/27/2020 and 12/15/2019. Abdominal CT 12/28/2019. CT cervical spine 10/10/2019 and thyroid ultrasound 08/24/2013. FINDINGS: Cardiovascular: The pulmonary arteries are well opacified with contrast to the level of the subsegmental branches. There is no evidence of acute pulmonary embolism. Mild central enlargement of the pulmonary arteries. Atherosclerosis of the aorta, great vessels and  coronary arteries status post median sternotomy and CABG. AICD leads extend into the right atrium, right ventricle and coronary sinus. The heart size is normal. There is no pericardial effusion. Mediastinum/Nodes: There are no enlarged mediastinal, hilar or axillary lymph nodes.Previously biopsied large right thyroid nodule measures up to 4.1 x 3.7 cm on image 8/4, similar to previous studies. There is mild mass effect on the trachea. The esophagus demonstrates no significant findings. Lungs/Pleura: No pleural effusion or pneumothorax. Mild centrilobular emphysema. There is airway thickening and partial opacification the left lower lobe bronchus. There is left lower lobe volume loss and patchy airspace disease consistent with aspiration. Within the aerated portions of the left lower lobe, there are ill-defined nodular densities which are likely inflammatory. Upper abdomen: No significant findings within the visualized upper abdomen. Musculoskeletal/Chest wall: There is no chest wall mass or suspicious osseous finding. Review of the MIP images confirms the above findings. IMPRESSION: 1. No evidence of acute pulmonary embolism or other acute vascular  findings. 2. Left lower lobe volume loss and patchy airspace disease consistent with aspiration pneumonia. Ill-defined nodular densities in the aerated portions of the left lower lobe are likely inflammatory. Radiographic follow up recommended. 3. Stable large right thyroid nodule, previously biopsied. This has been documented on prior studies (ref: J Am Coll Radiol. 2015 Feb;12(2): 143-50). 4. Aortic Atherosclerosis (ICD10-I70.0). Mild central enlargement of the pulmonary arteries suggesting pulmonary arterial hypertension. Electronically Signed   By: Richardean Sale M.D.   On: 01/27/2020 17:58   DG Chest Port 1 View  Result Date: 01/27/2020 CLINICAL DATA:  Altered mental status for 3 days. Fever. EXAM: PORTABLE CHEST 1 VIEW COMPARISON:  Radiographs 12/15/2019 and  12/11/2019. Abdominal CT 12/28/2019. FINDINGS: 1417 hours. Left subclavian AICD with leads in the right atrium, right ventricle and coronary sinus. Stable heart size and mediastinal contours post median sternotomy and CABG. There is mild aortic atherosclerosis. The lungs are clear. There is no pleural effusion or pneumothorax. No acute osseous findings. Telemetry leads overlie the chest. IMPRESSION: Stable chest. No active cardiopulmonary process. Electronically Signed   By: Richardean Sale M.D.   On: 01/27/2020 14:31   VAS [place] UPPER EXTREMITY VENOUS DUPLEX  Result Date: 01/31/2020 UPPER VENOUS STUDY  Indications: Swelling Limitations: Patient positioning. Comparison Study: no prior Performing Technologist: June Leap RDMS, RVT  Examination Guidelines: A complete evaluation includes B-mode imaging, spectral Doppler, color Doppler, and power Doppler as needed of all accessible portions of each vessel. Bilateral testing is considered an integral part of a complete examination. Limited examinations for reoccurring indications may be performed as noted.  Right Findings: +----------+------------+---------+-----------+----------+-------------------+ RIGHT     CompressiblePhasicitySpontaneousProperties      Summary       +----------+------------+---------+-----------+----------+-------------------+ IJV           Full       Yes       Yes                                  +----------+------------+---------+-----------+----------+-------------------+ Subclavian               Yes       Yes              not well visualized +----------+------------+---------+-----------+----------+-------------------+ Axillary      Full       Yes       Yes                                  +----------+------------+---------+-----------+----------+-------------------+ Brachial      Full       Yes       Yes                                   +----------+------------+---------+-----------+----------+-------------------+ Radial        Full                                                      +----------+------------+---------+-----------+----------+-------------------+ Ulnar         Full                                                      +----------+------------+---------+-----------+----------+-------------------+  Cephalic      Full                                                      +----------+------------+---------+-----------+----------+-------------------+ Basilic       Full                                                      +----------+------------+---------+-----------+----------+-------------------+  Left Findings: +----+------------+---------+-----------+----------+--------------+ LEFTCompressiblePhasicitySpontaneousProperties   Summary     +----+------------+---------+-----------+----------+--------------+ IJV                                           Not visualized +----+------------+---------+-----------+----------+--------------+  Summary:  Right: No evidence of deep vein thrombosis in the upper extremity. No evidence of superficial vein thrombosis in the upper extremity.  *See table(s) above for measurements and observations.  Diagnosing physician: Deitra Mayo MD Electronically signed by Deitra Mayo MD on 01/31/2020 at 3:26:16 PM.    Final       Subjective: No concerns  Discharge Exam: Vitals:   02/02/20 1902 02/03/20 0511  BP: (!) 145/61 (!) 147/54  Pulse: 77 74  Resp: 14 20  Temp: 98.2 F (36.8 C) 98.9 F (37.2 C)  SpO2: 94% 96%   Vitals:   02/01/20 2220 02/02/20 0602 02/02/20 1902 02/03/20 0511  BP: (!) 146/61 131/61 (!) 145/61 (!) 147/54  Pulse: 79 77 77 74  Resp: 18 17 14 20   Temp: 99.2 F (37.3 C) 97.7 F (36.5 C) 98.2 F (36.8 C) 98.9 F (37.2 C)  TempSrc: Axillary  Oral Axillary  SpO2: 98% 97% 94% 96%  Weight:      Height:         General: Pt is alert, awake, not in acute distress Cardiovascular: RRR, S1/S2 +, no rubs, no gallops Respiratory: CTA bilaterally, no wheezing, no rhonchi Abdominal: Soft, NT, ND, bowel sounds + Extremities: no edema, no cyanosis    The results of significant diagnostics from this hospitalization (including imaging, microbiology, ancillary and laboratory) are listed below for reference.     Microbiology: Recent Results (from the past 240 hour(s))  Blood Culture (routine x 2)     Status: None   Collection Time: 01/27/20  2:53 PM   Specimen: BLOOD  Result Value Ref Range Status   Specimen Description BLOOD RIGHT ANTECUBITAL  Final   Special Requests   Final    BOTTLES DRAWN AEROBIC AND ANAEROBIC Blood Culture adequate volume Performed at [facility] [address] [place], [place] [address]    Culture NO GROWTH 5 DAYS  Final   Report Status 02/01/2020 FINAL  Final  Urine culture     Status: Abnormal   Collection Time: 01/27/20  5:39 PM   Specimen: In/Out Cath Urine  Result Value Ref Range Status   Specimen Description   Final    IN/OUT CATH URINE Performed at [facility] [address], [place], [place] [address]    Special Requests   Final    NONE Performed at [facility], [address] [place], [place]  [address]    Culture (A)  Final    >=100,000 COLONIES/mL ESCHERICHIA COLI >=100,000 COLONIES/mL VIRIDANS STREPTOCOCCUS Standardized susceptibility testing for this organism is not available. Performed at [facility], [place] [address], [place], [place] [address]    Report Status 01/30/2020 FINAL  Final   Organism ID, Bacteria ESCHERICHIA COLI (A)  Final      Susceptibility   Escherichia coli - MIC*    AMPICILLIN 8 SENSITIVE Sensitive     CEFAZOLIN <=4 SENSITIVE Sensitive     CEFTRIAXONE <=0.25 SENSITIVE Sensitive     CIPROFLOXACIN <=0.25 SENSITIVE Sensitive     GENTAMICIN <=1 SENSITIVE Sensitive      IMIPENEM <=0.25 SENSITIVE Sensitive     NITROFURANTOIN <=16 SENSITIVE Sensitive     TRIMETH/SULFA <=20 SENSITIVE Sensitive     AMPICILLIN/SULBACTAM <=2 SENSITIVE Sensitive     PIP/TAZO <=4 SENSITIVE Sensitive     * >=100,000 COLONIES/mL ESCHERICHIA COLI  SARS CORONAVIRUS 2 (TAT 6-24 HRS) Nasopharyngeal Nasopharyngeal Swab     Status: None   Collection Time: 01/27/20  6:10 PM   Specimen: Nasopharyngeal Swab  Result Value Ref Range Status   SARS Coronavirus 2 NEGATIVE NEGATIVE Final    Comment: (NOTE) SARS-CoV-2 target nucleic acids are NOT DETECTED. The SARS-CoV-2 RNA is generally detectable in upper and lower respiratory specimens during the acute phase of infection. Negative results do not preclude SARS-CoV-2 infection, do not rule out co-infections with other pathogens, and should not be used as the sole basis for treatment or other patient management decisions. Negative results must be combined with clinical observations, patient history, and epidemiological information. The expected result is Negative. Fact Sheet for Patients: SugarRoll.be Fact Sheet for Healthcare Providers: https://www.woods-mathews.com/ This test is not yet approved or cleared by the [place] FDA and  has been authorized for detection and/or diagnosis of SARS-CoV-2 by FDA under an Emergency Use Authorization (EUA). This EUA will remain  in effect (meaning this test can be used) for the duration of the COVID-19 declaration under Section 56 4(b)(1) of the Act, 21 U.S.C. section 360bbb-3(b)(1), unless the authorization is terminated or revoked sooner. Performed at [facility], [place] [address], [place], [place] [address]   MRSA PCR Screening     Status: None   Collection Time: 01/27/20  7:29 PM   Specimen: Nasal Mucosa; Nasopharyngeal  Result Value Ref Range Status   MRSA by PCR NEGATIVE NEGATIVE Final    Comment:        The GeneXpert MRSA Assay  (FDA approved for NASAL specimens only), is one component of a comprehensive MRSA colonization surveillance program. It is not intended to diagnose MRSA infection nor to guide or monitor treatment for MRSA infections. Performed at [facility], [place] [address] [place], [place] [address]   Blood Culture (routine x 2)     Status: None   Collection Time: 01/27/20  9:18 PM   Specimen: BLOOD RIGHT HAND  Result Value Ref Range Status   Specimen Description   Final    BLOOD RIGHT HAND Performed at [facility] [address], [place], [place] [address]    Special Requests   Final    BOTTLES DRAWN AEROBIC ONLY Blood Culture adequate volume Performed at [facility] [address], [place], [place] [address]    Culture   Final    NO GROWTH 5 DAYS Performed at [facility], [place] [address] [place], [place] [address]  Report Status 02/02/2020 FINAL  Final  SARS CORONAVIRUS 2 (TAT 6-24 HRS) Nasopharyngeal Nasopharyngeal Swab     Status: None   Collection Time: 01/30/20  1:39 PM   Specimen: Nasopharyngeal Swab  Result Value Ref Range Status   SARS Coronavirus 2 NEGATIVE NEGATIVE Final    Comment: (NOTE) SARS-CoV-2 target nucleic acids are NOT DETECTED. The SARS-CoV-2 RNA is generally detectable in upper and lower respiratory specimens during the acute phase of infection. Negative results do not preclude SARS-CoV-2 infection, do not rule out co-infections with other pathogens, and should not be used as the sole basis for treatment or other patient management decisions. Negative results must be combined with clinical observations, patient history, and epidemiological information. The expected result is Negative. Fact Sheet for Patients: SugarRoll.be Fact Sheet for Healthcare Providers: https://www.woods-mathews.com/ This test is not yet approved or cleared by the [place]  FDA and  has been authorized for detection and/or diagnosis of SARS-CoV-2 by FDA under an Emergency Use Authorization (EUA). This EUA will remain  in effect (meaning this test can be used) for the duration of the COVID-19 declaration under Section 56 4(b)(1) of the Act, 21 U.S.C. section 360bbb-3(b)(1), unless the authorization is terminated or revoked sooner. Performed at [facility], [place] [address], [place], [place] [address]      Labs: BNP (last 3 results) No results for input(s): BNP in the last 8760 hours. Basic Metabolic Panel: Recent Labs  Lab 01/28/20 0321 01/29/20 0325 01/30/20 0331 01/31/20 0318 02/02/20 1432  NA 152* 149* 149* 148* 141  K 3.9 3.6 3.1* 3.9 3.2*  CL 117* 116* 113* 112* 104  CO2 27 25 27 29 31   GLUCOSE 276* 401* 182* 186* 124*  BUN 34* 27* 21 15 12   CREATININE 0.89 0.87 0.73 0.71 0.82  CALCIUM 10.3 9.9 9.4 10.0 10.1   Liver Function Tests: Recent Labs  Lab 01/27/20 1453 01/28/20 0321  AST 62* 28  ALT 78* 53*  ALKPHOS 89 77  BILITOT 0.5 0.7  PROT 7.8 6.8  ALBUMIN 3.4* 3.0*   No results for input(s): LIPASE, AMYLASE in the last 168 hours. Recent Labs  Lab 01/27/20 1817  AMMONIA 22   CBC: Recent Labs  Lab 01/27/20 1453 01/28/20 0321 01/29/20 0325 01/30/20 0331 01/31/20 0318  WBC 15.6* 21.3* 15.8* 12.5* 10.2  NEUTROABS 12.1*  --  12.2* 8.8* 6.8  HGB 11.5* 10.1* 10.3* 9.8* 10.1*  HCT 38.7 34.4* 35.1* 33.3* 33.8*  MCV 95.8 94.8 96.7 94.6 93.9  PLT 214 251 243 182 249   Cardiac Enzymes: No results for input(s): CKTOTAL, CKMB, CKMBINDEX, TROPONINI in the last 168 hours. BNP: Invalid input(s): POCBNP CBG: Recent Labs  Lab 02/02/20 0728 02/02/20 1146 02/02/20 1710 02/02/20 2138 02/03/20 0750  GLUCAP 158* 117* 108* 130* 146*   D-Dimer No results for input(s): DDIMER in the last 72 hours. Hgb A1c No results for input(s): HGBA1C in the last 72 hours. Lipid Profile No results for input(s): CHOL, HDL, LDLCALC, TRIG,  CHOLHDL, LDLDIRECT in the last 72 hours. Thyroid function studies No results for input(s): TSH, T4TOTAL, T3FREE, THYROIDAB in the last 72 hours.  Invalid input(s): FREET3 Anemia work up No results for input(s): VITAMINB12, FOLATE, FERRITIN, TIBC, IRON, RETICCTPCT in the last 72 hours. Urinalysis    Component Value Date/Time   COLORURINE RED (A) 01/27/2020 1739   APPEARANCEUR CLOUDY (A) 01/27/2020 1739   LABSPEC 1.014 01/27/2020 1739   PHURINE 5.0 01/27/2020 1739   GLUCOSEU >=500 (A) 01/27/2020  [place] 01/27/2020 [place] 01/27/2020 1739   KETONESUR NEGATIVE 01/27/2020 1739   PROTEINUR 30 (A) 01/27/2020 1739   UROBILINOGEN 0.2 08/07/2015 1826   NITRITE NEGATIVE 01/27/2020 1739   LEUKOCYTESUR LARGE (A) 01/27/2020 1739   Sepsis Labs Invalid input(s): PROCALCITONIN,  WBC,  LACTICIDVEN Microbiology Recent Results (from the past 240 hour(s))  Blood Culture (routine x 2)     Status: None   Collection Time: 01/27/20  2:53 PM   Specimen: BLOOD  Result Value Ref Range Status   Specimen Description BLOOD RIGHT ANTECUBITAL  Final   Special Requests   Final    BOTTLES DRAWN AEROBIC AND ANAEROBIC Blood Culture adequate volume Performed at [facility], [place] [address], [place], [place] [address]    Culture NO GROWTH 5 DAYS  Final   Report Status 02/01/2020 FINAL  Final  Urine culture     Status: Abnormal   Collection Time: 01/27/20  5:39 PM   Specimen: In/Out Cath Urine  Result Value Ref Range Status   Specimen Description   Final    IN/OUT CATH URINE Performed at [facility] [address] [place], [place] [address]    Special Requests   Final    NONE Performed at [facility], [place] [address], [place], [place] [address]    Culture (A)  Final    >=100,000 COLONIES/mL ESCHERICHIA COLI >=100,000 COLONIES/mL VIRIDANS STREPTOCOCCUS Standardized susceptibility testing for this organism is not  available. Performed at [facility], [place] [address], [place], [place] [address]    Report Status 01/30/2020 FINAL  Final   Organism ID, Bacteria ESCHERICHIA COLI (A)  Final      Susceptibility   Escherichia coli - MIC*    AMPICILLIN 8 SENSITIVE Sensitive     CEFAZOLIN <=4 SENSITIVE Sensitive     CEFTRIAXONE <=0.25 SENSITIVE Sensitive     CIPROFLOXACIN <=0.25 SENSITIVE Sensitive     GENTAMICIN <=1 SENSITIVE Sensitive     IMIPENEM <=0.25 SENSITIVE Sensitive     NITROFURANTOIN <=16 SENSITIVE Sensitive     TRIMETH/SULFA <=20 SENSITIVE Sensitive     AMPICILLIN/SULBACTAM <=2 SENSITIVE Sensitive     PIP/TAZO <=4 SENSITIVE Sensitive     * >=100,000 COLONIES/mL ESCHERICHIA COLI  SARS CORONAVIRUS 2 (TAT 6-24 HRS) Nasopharyngeal Nasopharyngeal Swab     Status: None   Collection Time: 01/27/20  6:10 PM   Specimen: Nasopharyngeal Swab  Result Value Ref Range Status   SARS Coronavirus 2 NEGATIVE NEGATIVE Final    Comment: (NOTE) SARS-CoV-2 target nucleic acids are NOT DETECTED. The SARS-CoV-2 RNA is generally detectable in upper and lower respiratory specimens during the acute phase of infection. Negative results do not preclude SARS-CoV-2 infection, do not rule out co-infections with other pathogens, and should not be used as the sole basis for treatment or other patient management decisions. Negative results must be combined with clinical observations, patient history, and epidemiological information. The expected result is Negative. Fact Sheet for Patients: SugarRoll.be Fact Sheet for Healthcare Providers: https://www.woods-mathews.com/ This test is not yet approved or cleared by the [place] FDA and  has been authorized for detection and/or diagnosis of SARS-CoV-2 by FDA under an Emergency Use Authorization (EUA). This EUA will remain  in effect (meaning this test can be used) for the duration of the COVID-19 declaration under Section  56 4(b)(1) of the Act, 21 U.S.C. section 360bbb-3(b)(1), unless the authorization is terminated or revoked sooner. Performed at  [facility], [place] [address], [place], [place] [address]   MRSA PCR Screening     Status: None   Collection Time: 01/27/20  7:29 PM   Specimen: Nasal Mucosa; Nasopharyngeal  Result Value Ref Range Status   MRSA by PCR NEGATIVE NEGATIVE Final    Comment:        The GeneXpert MRSA Assay (FDA approved for NASAL specimens only), is one component of a comprehensive MRSA colonization surveillance program. It is not intended to diagnose MRSA infection nor to guide or monitor treatment for MRSA infections. Performed at [facility], [place] [address] [place], [place] [address]   Blood Culture (routine x 2)     Status: None   Collection Time: 01/27/20  9:18 PM   Specimen: BLOOD RIGHT HAND  Result Value Ref Range Status   Specimen Description   Final    BLOOD RIGHT HAND Performed at [facility] [address] [place], [place] [address]    Special Requests   Final    BOTTLES DRAWN AEROBIC ONLY Blood Culture adequate volume Performed at [facility] [address] [place], [place] [address]    Culture   Final    NO GROWTH 5 DAYS Performed at [facility], [place] [address], [place], [place] [address]    Report Status 02/02/2020 FINAL  Final  SARS CORONAVIRUS 2 (TAT 6-24 HRS) Nasopharyngeal Nasopharyngeal Swab     Status: None   Collection Time: 01/30/20  1:39 PM   Specimen: Nasopharyngeal Swab  Result Value Ref Range Status   SARS Coronavirus 2 NEGATIVE NEGATIVE Final    Comment: (NOTE) SARS-CoV-2 target nucleic acids are NOT DETECTED. The SARS-CoV-2 RNA is generally detectable in upper and lower respiratory specimens during the acute phase of infection. Negative results do not preclude SARS-CoV-2 infection, do not rule out co-infections with other pathogens, and should not be used as  the sole basis for treatment or other patient management decisions. Negative results must be combined with clinical observations, patient history, and epidemiological information. The expected result is Negative. Fact Sheet for Patients: SugarRoll.be Fact Sheet for Healthcare Providers: https://www.woods-mathews.com/ This test is not yet approved or cleared by the [place] FDA and  has been authorized for detection and/or diagnosis of SARS-CoV-2 by FDA under an Emergency Use Authorization (EUA). This EUA will remain  in effect (meaning this test can be used) for the duration of the COVID-19 declaration under Section 56 4(b)(1) of the Act, 21 U.S.C. section 360bbb-3(b)(1), unless the authorization is terminated or revoked sooner. Performed at [facility], [place] [address], [place], [place] [address]      Time coordinating discharge: 35 minutes  SIGNED:   Cordelia Poche, MD [facility] 02/03/2020, 9:04 AM

## 2020-02-03 NOTE — TOC Transition Note (Addendum)
Transition of Care [facility]) - CM/SW Discharge Note   Patient Details  Name: [name] MRN: [id number] Date of Birth: 09/20/1928  [facility] CM/SW Contact:  [name], [place] Phone Number: 02/03/2020, 8:22 AM   Clinical Narrative: [facility] Ready to accept the patient today.     Patient approved to return to SNF for rehab.  Patient approved for 5 days, next review day 4/19 [facility] Auth# [id number], Ref# [id number] Facility notified.   Covid-19 test still pending.  Plan: D/C today  Nurse call report QQ:[phone]  Daughter [name] Notified.    Final next level of care: Skilled Nursing Facility Barriers to Discharge: Barriers Resolved   Patient Goals and CMS Choice Patient states their goals for this hospitalization and ongoing recovery are:: Continue rehab CMS Medicare.gov Compare Post Acute Care list provided to:: Other (Comment Required)(Adult Children) Choice offered to / list presented to : Adult Children  Discharge Placement                       Discharge Plan and Services In-house Referral: NA Discharge Planning Services: CM Consult Post Acute Care Choice: Skilled Nursing Facility                               Social Determinants of Health (SDOH) Interventions     Readmission Risk Interventions No flowsheet data found.

## 2020-02-04 NOTE — Progress Notes (Signed)
Patient transferred to [facility] via [facility], condition stable at time of discharge receiving facility notified of transport.

## 2020-02-16 ENCOUNTER — Encounter: Payer: Medicare Other | Attending: Physical Medicine & Rehabilitation | Admitting: Physical Medicine & Rehabilitation

## 2020-02-20 ENCOUNTER — Encounter: Payer: Self-pay | Admitting: Neurology

## 2020-02-20 ENCOUNTER — Telehealth: Payer: Self-pay

## 2020-02-20 ENCOUNTER — Ambulatory Visit: Payer: Medicare Other | Admitting: Neurology

## 2020-02-20 NOTE — Telephone Encounter (Signed)
Pt no show for hospital follow up.

## 2020-02-27 ENCOUNTER — Emergency Department (HOSPITAL_COMMUNITY): Payer: Medicare Other

## 2020-02-27 ENCOUNTER — Other Ambulatory Visit: Payer: Self-pay

## 2020-02-27 ENCOUNTER — Inpatient Hospital Stay (HOSPITAL_COMMUNITY)
Admission: EM | Admit: 2020-02-27 | Discharge: 2020-03-06 | DRG: 871 | Disposition: A | Discharge status: Hospice | Payer: Medicare Other | Source: Skilled Nursing Facility | Attending: Family Medicine | Admitting: Family Medicine

## 2020-02-27 ENCOUNTER — Encounter (HOSPITAL_COMMUNITY): Payer: Self-pay | Admitting: *Deleted

## 2020-02-27 DIAGNOSIS — Z955 Presence of coronary angioplasty implant and graft: Secondary | ICD-10-CM

## 2020-02-27 DIAGNOSIS — K1120 Sialoadenitis, unspecified: Secondary | ICD-10-CM

## 2020-02-27 DIAGNOSIS — H409 Unspecified glaucoma: Secondary | ICD-10-CM | POA: Diagnosis present

## 2020-02-27 DIAGNOSIS — N1831 Chronic kidney disease, stage 3a: Secondary | ICD-10-CM | POA: Diagnosis present

## 2020-02-27 DIAGNOSIS — Z833 Family history of diabetes mellitus: Secondary | ICD-10-CM

## 2020-02-27 DIAGNOSIS — Z83438 Family history of other disorder of lipoprotein metabolism and other lipidemia: Secondary | ICD-10-CM

## 2020-02-27 DIAGNOSIS — G9341 Metabolic encephalopathy: Secondary | ICD-10-CM | POA: Diagnosis present

## 2020-02-27 DIAGNOSIS — R1310 Dysphagia, unspecified: Secondary | ICD-10-CM | POA: Diagnosis present

## 2020-02-27 DIAGNOSIS — K219 Gastro-esophageal reflux disease without esophagitis: Secondary | ICD-10-CM | POA: Diagnosis present

## 2020-02-27 DIAGNOSIS — Z7902 Long term (current) use of antithrombotics/antiplatelets: Secondary | ICD-10-CM

## 2020-02-27 DIAGNOSIS — Z87891 Personal history of nicotine dependence: Secondary | ICD-10-CM

## 2020-02-27 DIAGNOSIS — R0602 Shortness of breath: Secondary | ICD-10-CM | POA: Diagnosis not present

## 2020-02-27 DIAGNOSIS — Z515 Encounter for palliative care: Secondary | ICD-10-CM

## 2020-02-27 DIAGNOSIS — I69391 Dysphagia following cerebral infarction: Secondary | ICD-10-CM

## 2020-02-27 DIAGNOSIS — I130 Hypertensive heart and chronic kidney disease with heart failure and stage 1 through stage 4 chronic kidney disease, or unspecified chronic kidney disease: Secondary | ICD-10-CM | POA: Diagnosis present

## 2020-02-27 DIAGNOSIS — Z794 Long term (current) use of insulin: Secondary | ICD-10-CM

## 2020-02-27 DIAGNOSIS — K1121 Acute sialoadenitis: Secondary | ICD-10-CM | POA: Diagnosis present

## 2020-02-27 DIAGNOSIS — I5022 Chronic systolic (congestive) heart failure: Secondary | ICD-10-CM | POA: Diagnosis present

## 2020-02-27 DIAGNOSIS — Z809 Family history of malignant neoplasm, unspecified: Secondary | ICD-10-CM

## 2020-02-27 DIAGNOSIS — E860 Dehydration: Secondary | ICD-10-CM | POA: Diagnosis not present

## 2020-02-27 DIAGNOSIS — R0902 Hypoxemia: Secondary | ICD-10-CM | POA: Diagnosis present

## 2020-02-27 DIAGNOSIS — I2510 Atherosclerotic heart disease of native coronary artery without angina pectoris: Secondary | ICD-10-CM | POA: Diagnosis present

## 2020-02-27 DIAGNOSIS — N179 Acute kidney failure, unspecified: Secondary | ICD-10-CM | POA: Diagnosis present

## 2020-02-27 DIAGNOSIS — L8990 Pressure ulcer of unspecified site, unspecified stage: Secondary | ICD-10-CM | POA: Diagnosis present

## 2020-02-27 DIAGNOSIS — J690 Pneumonitis due to inhalation of food and vomit: Secondary | ICD-10-CM | POA: Diagnosis present

## 2020-02-27 DIAGNOSIS — I69311 Memory deficit following cerebral infarction: Secondary | ICD-10-CM | POA: Diagnosis not present

## 2020-02-27 DIAGNOSIS — Z66 Do not resuscitate: Secondary | ICD-10-CM | POA: Diagnosis present

## 2020-02-27 DIAGNOSIS — Z8249 Family history of ischemic heart disease and other diseases of the circulatory system: Secondary | ICD-10-CM

## 2020-02-27 DIAGNOSIS — I442 Atrioventricular block, complete: Secondary | ICD-10-CM | POA: Diagnosis present

## 2020-02-27 DIAGNOSIS — Z931 Gastrostomy status: Secondary | ICD-10-CM | POA: Diagnosis not present

## 2020-02-27 DIAGNOSIS — A4102 Sepsis due to Methicillin resistant Staphylococcus aureus: Secondary | ICD-10-CM | POA: Diagnosis present

## 2020-02-27 DIAGNOSIS — F0390 Unspecified dementia without behavioral disturbance: Secondary | ICD-10-CM | POA: Diagnosis present

## 2020-02-27 DIAGNOSIS — Z7982 Long term (current) use of aspirin: Secondary | ICD-10-CM

## 2020-02-27 DIAGNOSIS — L89153 Pressure ulcer of sacral region, stage 3: Secondary | ICD-10-CM | POA: Diagnosis present

## 2020-02-27 DIAGNOSIS — Z20822 Contact with and (suspected) exposure to covid-19: Secondary | ICD-10-CM | POA: Diagnosis present

## 2020-02-27 DIAGNOSIS — Z950 Presence of cardiac pacemaker: Secondary | ICD-10-CM

## 2020-02-27 DIAGNOSIS — E876 Hypokalemia: Secondary | ICD-10-CM | POA: Diagnosis not present

## 2020-02-27 DIAGNOSIS — D72829 Elevated white blood cell count, unspecified: Secondary | ICD-10-CM | POA: Diagnosis not present

## 2020-02-27 DIAGNOSIS — I69351 Hemiplegia and hemiparesis following cerebral infarction affecting right dominant side: Secondary | ICD-10-CM | POA: Diagnosis not present

## 2020-02-27 DIAGNOSIS — A419 Sepsis, unspecified organism: Secondary | ICD-10-CM | POA: Diagnosis not present

## 2020-02-27 DIAGNOSIS — D631 Anemia in chronic kidney disease: Secondary | ICD-10-CM | POA: Diagnosis present

## 2020-02-27 DIAGNOSIS — R627 Adult failure to thrive: Secondary | ICD-10-CM | POA: Diagnosis present

## 2020-02-27 DIAGNOSIS — J9601 Acute respiratory failure with hypoxia: Secondary | ICD-10-CM | POA: Diagnosis present

## 2020-02-27 DIAGNOSIS — J189 Pneumonia, unspecified organism: Secondary | ICD-10-CM

## 2020-02-27 DIAGNOSIS — R531 Weakness: Secondary | ICD-10-CM | POA: Diagnosis not present

## 2020-02-27 DIAGNOSIS — E1122 Type 2 diabetes mellitus with diabetic chronic kidney disease: Secondary | ICD-10-CM | POA: Diagnosis present

## 2020-02-27 DIAGNOSIS — R1319 Other dysphagia: Secondary | ICD-10-CM | POA: Diagnosis not present

## 2020-02-27 DIAGNOSIS — E870 Hyperosmolality and hypernatremia: Secondary | ICD-10-CM | POA: Diagnosis present

## 2020-02-27 DIAGNOSIS — G4733 Obstructive sleep apnea (adult) (pediatric): Secondary | ICD-10-CM | POA: Diagnosis present

## 2020-02-27 DIAGNOSIS — B379 Candidiasis, unspecified: Secondary | ICD-10-CM | POA: Diagnosis not present

## 2020-02-27 DIAGNOSIS — E1165 Type 2 diabetes mellitus with hyperglycemia: Secondary | ICD-10-CM | POA: Diagnosis present

## 2020-02-27 DIAGNOSIS — I69320 Aphasia following cerebral infarction: Secondary | ICD-10-CM

## 2020-02-27 DIAGNOSIS — Z951 Presence of aortocoronary bypass graft: Secondary | ICD-10-CM

## 2020-02-27 DIAGNOSIS — E7800 Pure hypercholesterolemia, unspecified: Secondary | ICD-10-CM | POA: Diagnosis present

## 2020-02-27 DIAGNOSIS — I255 Ischemic cardiomyopathy: Secondary | ICD-10-CM | POA: Diagnosis present

## 2020-02-27 LAB — CBC WITH DIFFERENTIAL/PLATELET
Abs Immature Granulocytes: 0.07 10*3/uL (ref 0.00–0.07)
Basophils Absolute: 0.1 10*3/uL (ref 0.0–0.1)
Basophils Relative: 0 %
Eosinophils Absolute: 0 10*3/uL (ref 0.0–0.5)
Eosinophils Relative: 0 %
HCT: 40.3 % (ref 36.0–46.0)
Hemoglobin: 11.9 g/dL — ABNORMAL LOW (ref 12.0–15.0)
Immature Granulocytes: 1 %
Lymphocytes Relative: 12 %
Lymphs Abs: 1.6 10*3/uL (ref 0.7–4.0)
MCH: 28.1 pg (ref 26.0–34.0)
MCHC: 29.5 g/dL — ABNORMAL LOW (ref 30.0–36.0)
MCV: 95.3 fL (ref 80.0–100.0)
Monocytes Absolute: 0.9 10*3/uL (ref 0.1–1.0)
Monocytes Relative: 7 %
Neutro Abs: 10.2 10*3/uL — ABNORMAL HIGH (ref 1.7–7.7)
Neutrophils Relative %: 80 %
Platelets: 369 10*3/uL (ref 150–400)
RBC: 4.23 MIL/uL (ref 3.87–5.11)
RDW: 14 % (ref 11.5–15.5)
WBC: 12.8 10*3/uL — ABNORMAL HIGH (ref 4.0–10.5)
nRBC: 0 % (ref 0.0–0.2)

## 2020-02-27 LAB — LACTIC ACID, PLASMA
Lactic Acid, Venous: 1.7 mmol/L (ref 0.5–1.9)
Lactic Acid, Venous: 1.4 mmol/L (ref 0.5–1.9)

## 2020-02-27 LAB — COMPREHENSIVE METABOLIC PANEL
ALT: 46 U/L — ABNORMAL HIGH (ref 0–44)
AST: 24 U/L (ref 15–41)
Albumin: 2.9 g/dL — ABNORMAL LOW (ref 3.5–5.0)
Alkaline Phosphatase: 100 U/L (ref 38–126)
Anion gap: 12 (ref 5–15)
BUN: 23 mg/dL (ref 8–23)
CO2: 26 mmol/L (ref 22–32)
Calcium: 11.9 mg/dL — ABNORMAL HIGH (ref 8.9–10.3)
Chloride: 114 mmol/L — ABNORMAL HIGH (ref 98–111)
Creatinine, Ser: 1.03 mg/dL — ABNORMAL HIGH (ref 0.44–1.00)
GFR calc Af Amer: 56 mL/min — ABNORMAL LOW (ref >60)
GFR calc non Af Amer: 48 mL/min — ABNORMAL LOW (ref >60)
Glucose, Bld: 239 mg/dL — ABNORMAL HIGH (ref 70–99)
Potassium: 3.5 mmol/L (ref 3.5–5.1)
Sodium: 152 mmol/L — ABNORMAL HIGH (ref 135–145)
Total Bilirubin: 0.6 mg/dL (ref 0.3–1.2)
Total Protein: 7.5 g/dL (ref 6.5–8.1)

## 2020-02-27 LAB — SARS CORONAVIRUS 2 BY RT PCR (HOSPITAL ORDER, PERFORMED IN ~~LOC~~ HOSPITAL LAB): SARS Coronavirus 2: NEGATIVE

## 2020-02-27 LAB — CBG MONITORING, ED: Glucose-Capillary: 256 mg/dL — ABNORMAL HIGH (ref 70–99)

## 2020-02-27 LAB — PROTIME-INR
INR: 1.1 (ref 0.8–1.2)
Prothrombin Time: 13.6 seconds (ref 11.4–15.2)

## 2020-02-27 LAB — APTT: aPTT: 23 seconds — ABNORMAL LOW (ref 24–36)

## 2020-02-27 MED ORDER — INSULIN ASPART 100 UNIT/ML ~~LOC~~ SOLN
0.0000 [IU] | Freq: Three times a day (TID) | SUBCUTANEOUS | Status: DC
  Administered 2020-02-27: 5 [IU] via SUBCUTANEOUS
  Administered 2020-02-28: 3 [IU] via SUBCUTANEOUS

## 2020-02-27 MED ORDER — CARVEDILOL 3.125 MG PO TABS
3.1250 mg | ORAL_TABLET | Freq: Two times a day (BID) | ORAL | Status: DC
  Administered 2020-02-27 – 2020-03-06 (×17): 3.125 mg
  Filled 2020-02-27 (×18): qty 1

## 2020-02-27 MED ORDER — DONEPEZIL HCL 10 MG PO TABS
10.0000 mg | ORAL_TABLET | Freq: Every day | ORAL | Status: DC
  Administered 2020-02-27 – 2020-03-06 (×9): 10 mg
  Filled 2020-02-27 (×9): qty 1

## 2020-02-27 MED ORDER — VANCOMYCIN HCL IN DEXTROSE 1-5 GM/200ML-% IV SOLN: 1000.0000 mg | Freq: Once | INTRAVENOUS | Status: DC

## 2020-02-27 MED ORDER — CLOPIDOGREL BISULFATE 75 MG PO TABS
75.0000 mg | ORAL_TABLET | Freq: Every day | ORAL | Status: DC
  Administered 2020-02-27 – 2020-03-06 (×9): 75 mg
  Filled 2020-02-27 (×9): qty 1

## 2020-02-27 MED ORDER — ONDANSETRON HCL 4 MG/2ML IJ SOLN
4.0000 mg | Freq: Four times a day (QID) | INTRAMUSCULAR | Status: DC | PRN
  Administered 2020-02-27: 4 mg via INTRAVENOUS
  Filled 2020-02-27: qty 2

## 2020-02-27 MED ORDER — VANCOMYCIN HCL 1500 MG/300ML IV SOLN
1500.0000 mg | Freq: Once | INTRAVENOUS | Status: AC
  Administered 2020-02-27: 1500 mg via INTRAVENOUS
  Filled 2020-02-27: qty 300

## 2020-02-27 MED ORDER — LABETALOL HCL 5 MG/ML IV SOLN: 5.0000 mg | INTRAVENOUS | Status: DC | PRN

## 2020-02-27 MED ORDER — SODIUM CHLORIDE 0.9 % IV SOLN
2.0000 g | Freq: Once | INTRAVENOUS | Status: AC
  Administered 2020-02-27: 2 g via INTRAVENOUS
  Filled 2020-02-27: qty 2

## 2020-02-27 MED ORDER — IOHEXOL 300 MG/ML  SOLN
75.0000 mL | Freq: Once | INTRAMUSCULAR | Status: AC | PRN
  Administered 2020-02-27: 75 mL via INTRAVENOUS

## 2020-02-27 MED ORDER — BACID PO TABS
2.0000 | ORAL_TABLET | Freq: Three times a day (TID) | ORAL | Status: DC
  Administered 2020-02-28 – 2020-03-06 (×22): 2 via ORAL
  Filled 2020-02-27 (×25): qty 2

## 2020-02-27 MED ORDER — ASPIRIN 81 MG PO CHEW
81.0000 mg | CHEWABLE_TABLET | Freq: Every day | ORAL | Status: DC
  Administered 2020-02-27 – 2020-03-06 (×9): 81 mg via JEJUNOSTOMY
  Filled 2020-02-27 (×9): qty 1

## 2020-02-27 MED ORDER — SODIUM CHLORIDE 0.9 % IV SOLN
2.0000 g | Freq: Two times a day (BID) | INTRAVENOUS | Status: AC
  Administered 2020-02-28 – 2020-03-04 (×13): 2 g via INTRAVENOUS
  Filled 2020-02-27 (×13): qty 2

## 2020-02-27 MED ORDER — BRIMONIDINE TARTRATE 0.2 % OP SOLN
1.0000 [drp] | Freq: Two times a day (BID) | OPHTHALMIC | Status: DC
  Administered 2020-02-28 – 2020-03-06 (×15): 1 [drp] via OPHTHALMIC
  Filled 2020-02-27: qty 5

## 2020-02-27 MED ORDER — POLYETHYLENE GLYCOL 3350 17 G PO PACK
17.0000 g | PACK | Freq: Two times a day (BID) | ORAL | Status: DC
  Administered 2020-02-27 – 2020-03-05 (×9): 17 g
  Filled 2020-02-27 (×9): qty 1

## 2020-02-27 MED ORDER — VANCOMYCIN HCL IN DEXTROSE 1-5 GM/200ML-% IV SOLN
1000.0000 mg | INTRAVENOUS | Status: DC
  Administered 2020-02-28 – 2020-03-03 (×5): 1000 mg via INTRAVENOUS
  Filled 2020-02-27 (×7): qty 200

## 2020-02-27 MED ORDER — PANTOPRAZOLE SODIUM 40 MG PO PACK
40.0000 mg | PACK | Freq: Every day | ORAL | Status: DC
  Administered 2020-02-28 – 2020-03-06 (×8): 40 mg
  Filled 2020-02-27 (×10): qty 20

## 2020-02-27 MED ORDER — SENNOSIDES-DOCUSATE SODIUM 8.6-50 MG PO TABS
1.0000 | ORAL_TABLET | Freq: Two times a day (BID) | ORAL | Status: DC
  Administered 2020-02-27 – 2020-03-05 (×11): 1
  Filled 2020-02-27 (×11): qty 1

## 2020-02-27 MED ORDER — SERTRALINE HCL 25 MG PO TABS
25.0000 mg | ORAL_TABLET | Freq: Every day | ORAL | Status: DC
  Administered 2020-02-27 – 2020-03-06 (×9): 25 mg
  Filled 2020-02-27 (×9): qty 1

## 2020-02-27 MED ORDER — ACETAMINOPHEN 500 MG PO TABS
500.0000 mg | ORAL_TABLET | Freq: Two times a day (BID) | ORAL | Status: DC
  Administered 2020-02-27: 500 mg
  Filled 2020-02-27 (×2): qty 1

## 2020-02-27 MED ORDER — AMLODIPINE BESYLATE 10 MG PO TABS
10.0000 mg | ORAL_TABLET | Freq: Every day | ORAL | Status: DC
  Administered 2020-02-27 – 2020-03-06 (×9): 10 mg
  Filled 2020-02-27: qty 2
  Filled 2020-02-27 (×8): qty 1

## 2020-02-27 MED ORDER — MUSCLE RUB 10-15 % EX CREA
1.0000 "application " | TOPICAL_CREAM | Freq: Two times a day (BID) | CUTANEOUS | Status: DC
  Administered 2020-02-28 – 2020-03-06 (×15): 1 via TOPICAL
  Filled 2020-02-27 (×2): qty 85

## 2020-02-27 MED ORDER — GUAIFENESIN 100 MG/5ML PO SOLN
5.0000 mL | ORAL | Status: DC | PRN
  Filled 2020-02-27: qty 5

## 2020-02-27 MED ORDER — SODIUM CHLORIDE 0.9 % IV SOLN
500.0000 mg | Freq: Once | INTRAVENOUS | Status: AC
  Administered 2020-02-27: 500 mg via INTRAVENOUS
  Filled 2020-02-27: qty 500

## 2020-02-27 MED ORDER — SODIUM CHLORIDE 0.45 % IV SOLN: INTRAVENOUS | Status: AC

## 2020-02-27 MED ORDER — ATORVASTATIN CALCIUM 80 MG PO TABS
80.0000 mg | ORAL_TABLET | Freq: Every day | ORAL | Status: DC
  Administered 2020-02-27 – 2020-03-05 (×8): 80 mg
  Filled 2020-02-27 (×8): qty 1

## 2020-02-27 MED ORDER — DORZOLAMIDE HCL 2 % OP SOLN
1.0000 [drp] | Freq: Three times a day (TID) | OPHTHALMIC | Status: DC
  Administered 2020-02-28 – 2020-03-06 (×22): 1 [drp] via OPHTHALMIC
  Filled 2020-02-27 (×2): qty 10

## 2020-02-27 MED ORDER — MENTHOL (TOPICAL ANALGESIC) 4 % EX GEL: 1.0000 "application " | Freq: Two times a day (BID) | CUTANEOUS | Status: DC

## 2020-02-27 MED ORDER — CHLORHEXIDINE GLUCONATE 0.12 % MT SOLN
15.0000 mL | Freq: Four times a day (QID) | OROMUCOSAL | Status: DC
  Administered 2020-02-27 – 2020-03-06 (×27): 15 mL via OROMUCOSAL
  Filled 2020-02-27 (×26): qty 15

## 2020-02-27 MED ORDER — ENOXAPARIN SODIUM 40 MG/0.4ML ~~LOC~~ SOLN
40.0000 mg | SUBCUTANEOUS | Status: DC
  Administered 2020-02-27 – 2020-03-05 (×8): 40 mg via SUBCUTANEOUS
  Filled 2020-02-27 (×8): qty 0.4

## 2020-02-27 MED ORDER — FERROUS SULFATE 220 (44 FE) MG/5ML PO ELIX
308.0000 mg | ORAL_SOLUTION | Freq: Every day | ORAL | Status: DC
  Administered 2020-02-27: 308 mg
  Filled 2020-02-27 (×2): qty 7

## 2020-02-27 MED ORDER — ACETAMINOPHEN 325 MG PO TABS
650.0000 mg | ORAL_TABLET | ORAL | Status: DC | PRN
  Administered 2020-02-27 – 2020-02-28 (×2): 650 mg
  Filled 2020-02-27 (×2): qty 2

## 2020-02-27 MED ORDER — SODIUM CHLORIDE 0.9% FLUSH: 3.0000 mL | Freq: Once | INTRAVENOUS | Status: DC

## 2020-02-27 MED ORDER — IPRATROPIUM-ALBUTEROL 0.5-2.5 (3) MG/3ML IN SOLN
3.0000 mL | Freq: Four times a day (QID) | RESPIRATORY_TRACT | Status: DC
  Administered 2020-02-27 – 2020-02-29 (×9): 3 mL via RESPIRATORY_TRACT
  Filled 2020-02-27 (×9): qty 3

## 2020-02-27 MED ORDER — IPRATROPIUM-ALBUTEROL 0.5-2.5 (3) MG/3ML IN SOLN: 3.0000 mL | Freq: Four times a day (QID) | RESPIRATORY_TRACT | Status: DC | PRN

## 2020-02-27 MED ORDER — METRONIDAZOLE IN NACL 5-0.79 MG/ML-% IV SOLN
500.0000 mg | Freq: Three times a day (TID) | INTRAVENOUS | Status: AC
  Administered 2020-02-27 – 2020-03-04 (×20): 500 mg via INTRAVENOUS
  Filled 2020-02-27 (×20): qty 100

## 2020-02-27 MED ORDER — LATANOPROST 0.005 % OP SOLN
1.0000 [drp] | Freq: Every day | OPHTHALMIC | Status: DC
  Administered 2020-02-28 – 2020-03-05 (×7): 1 [drp] via OPHTHALMIC
  Filled 2020-02-27: qty 2.5

## 2020-02-27 MED ORDER — FREE WATER
200.0000 mL | Freq: Four times a day (QID) | Status: DC
  Administered 2020-02-27 – 2020-02-28 (×3): 200 mL

## 2020-02-27 MED ORDER — SODIUM CHLORIDE 0.9 % IV SOLN: INTRAVENOUS | Status: DC

## 2020-02-27 NOTE — ED Notes (Signed)
Pt's SpO2 87% on 6L, placed on NRB.

## 2020-02-27 NOTE — ED Notes (Signed)
Dr. Karlton Lemon at bedside. Spoke with daughter, oral care given.

## 2020-02-27 NOTE — Progress Notes (Signed)
Pharmacy Antibiotic Note  [name] is a 86 y.o. female admitted on 02/27/2020 with pneumonia and sepsis. Pharmacy has been consulted for vancomycin and cefepime dosing. Pt has a low grade fever of 100.5 and WBC is elevated at 12.8. SCr is slightly above patients baseline. Lactic acid is <2.   Plan: Vancomycin 1578m IV x 1 then 1g IV Q24H Cefepime 2gm IV Q12H F/u renal fxn, C&S, clinical status and peak/trough at SS  Height: 5' 8"  (172.7 cm) Weight: 74.9 kg (165 lb 2 oz) IBW/kg (Calculated) : 63.9  Temp (24hrs), Avg:100.5 F (38.1 C), Min:100.5 F (38.1 C), Max:100.5 F (38.1 C)  Recent Labs  Lab 02/27/20 0830  WBC 12.8*  CREATININE 1.03*  LATICACIDVEN 1.7    Estimated Creatinine Clearance: 37.4 mL/min (A) (by C-G formula based on SCr of 1.03 mg/dL (H)).    No Known Allergies  Antimicrobials this admission: Vanc 5/10>> Cefepime 5/10>> Azithro x 1 5/10  Dose adjustments this admission: N/A  Microbiology results: Pending*  Thank you for allowing pharmacy to be a part of this patient's care.  Orlie Cundari, RRande Lawman5/07/2020 9:14 AM

## 2020-02-27 NOTE — H&P (Signed)
History and Physical    [name]:[id number] DOB: May 20, 1928 DOA: 02/27/2020  PCP: [name], MD   Patient coming from: SNF  I have personally briefly reviewed patient's old medical records in [facility]  Chief Complaint: SOB  HPI: [name] is a 86 y.o. female with medical history significant of recent stroke, IDDM, borderline CHF with EF 50 to 55%, HTN, HLD, dysphagia s/p PEG tube, complete heart block with PPM, CKD stage II, presented with vomiting and short of breath.  Patient has chronic aphasia from residual symptoms of stroke last month, most of the history was provided by patient daughter at bedside.  Daughter told me that patient vomited last night as well as this morning 2 times with tube feeding material.  Noted to have symptoms of dyspnea after the second vomiting this morning.  EMS arrived found the patient O2 saturation in the 60s, patient was placed on nonrebreather and transported to ER.  Patient was very lethargic in the ED, remained hypoxic.  Daughter also reported patient had a right-sided facial swelling today. ED Course: O2 stabilized on 10 L of oxygen.  Spiking fever 100.4.  X-ray showed bilateral lower field infiltrates, CT facial and neck showing right parotid swelling compatible with acute parotitis.  Sodium 132, calcium 11.9 Review of Systems: Unable to perform patient lethargic   Past Medical History:  Diagnosis Date  . Anemia   . Arthritis   . Celiac disease   . Chronic systolic CHF (congestive heart failure) ([place])   . Complete heart block ([place])   . Coronary artery disease   . Diabetes mellitus    INSULIN DEPENDENT  . GERD (gastroesophageal reflux disease)   . Glaucoma   . Headache(784.0)   . Heart disease   . Hypercholesterolemia   . Hypertension   . Iron deficiency anemia, unspecified   . Ischemic cardiomyopathy   . Memory difficulties 04/14/2019  . Shortness of breath   . Sleep apnea    uses cpap  . Stroke ([place])   .  Unspecified constipation     Past Surgical History:  Procedure Laterality Date  . BACK SURGERY    . BI-VENTRICULAR PACEMAKER INSERTION (CRT-P)  March 2014   [facility]  . BIV PACEMAKER GENERATOR CHANGEOUT N/A 06/29/2019   Procedure: BIV PACEMAKER GENERATOR CHANGEOUT;  Surgeon: Evans Lance, MD;  Location: [facility];  Service: Cardiovascular;  Laterality: N/A;  . CARDIAC CATHETERIZATION  09/02/2012  . CARDIAC SURGERY    . CORONARY ANGIOPLASTY WITH STENT PLACEMENT  09/02/2012   [facility]  . FLEXIBLE SIGMOIDOSCOPY N/A 12/30/2019   Procedure: FLEXIBLE SIGMOIDOSCOPY;  Surgeon: Carol Ada, MD;  Location: [facility];  Service: Endoscopy;  Laterality: N/A;  . IR GASTROSTOMY TUBE MOD SED  01/03/2020  . PERCUTANEOUS CORONARY STENT INTERVENTION (PCI-S) N/A 09/02/2012   Procedure: PERCUTANEOUS CORONARY STENT INTERVENTION (PCI-S);  Surgeon: Clent Demark, MD;  [facility];  Service: Cardiovascular;  Laterality: N/A;  . PERMANENT PACEMAKER INSERTION N/A 11/29/2012   Procedure: PERMANENT PACEMAKER INSERTION;  Surgeon: Deboraha Sprang, MD;  Location: [facility] CATH LAB;  Service: Cardiovascular;  Laterality: N/A;  . TEMPORARY PACEMAKER INSERTION N/A 11/28/2012   Procedure: TEMPORARY PACEMAKER INSERTION;  Surgeon: Clent Demark, MD;  Location: [facility] CATH LAB;  Service: Cardiovascular;  Laterality: N/A;     reports that she quit smoking about 43 years ago. She has never used smokeless tobacco. She reports that she does not drink alcohol or use drugs.  No Known Allergies  Family History  Problem Relation Age of Onset  . Diabetes Mother   . Hypertension Mother   . Hyperlipidemia Mother   . Cancer Sister   . Dementia Sister   . Neuropathy Sister      Prior to Admission medications   Medication Sig Start Date End Date Taking? Authorizing Provider  acetaminophen (ACETAMINOPHEN EXTRA STRENGTH) 500 MG tablet Place 500 mg into feeding tube every 12 (twelve) hours.   Yes [provider]  acetaminophen (TYLENOL) 325 MG tablet Place 650 mg into feeding tube every 4 (four) hours as needed for mild pain (general discomfort).    Yes [provider]  amLODipine (NORVASC) 10 MG tablet Place 1 tablet (10 mg total) into feeding tube daily. 01/31/20  Yes Shelly Coss, MD  aspirin 81 MG chewable tablet 1 tablet (81 mg total) by Per J Tube route daily. 01/10/20  Yes Angiulli, Lavon Paganini, PA-C  atorvastatin (LIPITOR) 80 MG tablet Place 1 tablet (80 mg total) into feeding tube every morning. Patient taking differently: Place 80 mg into feeding tube at bedtime.  01/10/20  Yes Angiulli, Lavon Paganini, PA-C  brimonidine (ALPHAGAN) 0.2 % ophthalmic solution Place 1 drop into both eyes 2 (two) times daily. 01/10/20  Yes Angiulli, Lavon Paganini, PA-C  carvedilol (COREG) 3.125 MG tablet Place 1 tablet (3.125 mg total) into feeding tube 2 (two) times daily. 01/10/20  Yes Angiulli, Lavon Paganini, PA-C  chlorhexidine (PERIDEX) 0.12 % solution Use as directed 15 mLs in the mouth or throat 4 (four) times daily.   Yes [provider]  clopidogrel (PLAVIX) 75 MG tablet Place 1 tablet (75 mg total) into feeding tube daily. 01/10/20  Yes Angiulli, Lavon Paganini, PA-C  donepezil (ARICEPT) 10 MG tablet Place 1 tablet (10 mg total) into feeding tube at bedtime. Patient taking differently: Place 10 mg into feeding tube daily.  01/10/20  Yes Angiulli, Lavon Paganini, PA-C  dorzolamide (TRUSOPT) 2 % ophthalmic solution Place 1 drop into both eyes 3 (three) times daily. 01/10/20  Yes Angiulli, Lavon Paganini, PA-C  ferrous sulfate 220 (44 Fe) MG/5ML solution Place 308 mg into feeding tube daily.    Yes [provider]  Infant Care Products (DERMACLOUD) CREA Apply 1 application topically in the morning and at bedtime.   Yes [provider]  insulin glargine (LANTUS) 100 UNIT/ML injection Inject 0.25 mLs (25 Units total) into the skin 2 (two) times daily. 01/31/20  Yes Adhikari, Tamsen Meek, MD  latanoprost  (XALATAN) 0.005 % ophthalmic solution Place 1 drop into both eyes at bedtime. 01/10/20  Yes Angiulli, Lavon Paganini, PA-C  Menthol, Topical Analgesic, (BIOFREEZE) 4 % GEL Apply 1 application topically every 12 (twelve) hours.   Yes [provider]  Nutritional Supplements (FEEDING SUPPLEMENT, GLUCERNA 1.5 CAL,) LIQD Place 237 mLs into feeding tube in the morning, at noon, in the evening, and at bedtime.   Yes [provider]  pantoprazole sodium (PROTONIX) 40 mg/20 mL PACK Place 20 mLs (40 mg total) into feeding tube daily before breakfast. 01/10/20  Yes Angiulli, Lavon Paganini, PA-C  polyethylene glycol (MIRALAX / GLYCOLAX) 17 g packet Place 17 g into feeding tube 2 (two) times daily. 01/10/20  Yes Angiulli, Lavon Paganini, PA-C  scopolamine (TRANSDERM-SCOP) 1 MG/3DAYS Place 1 patch (1.5 mg total) onto the skin every 3 (three) days. 01/12/20  Yes Angiulli, Lavon Paganini, PA-C  senna-docusate (SENOKOT-S) 8.6-50 MG tablet Place 1 tablet into feeding tube 2 (two) times daily. 01/10/20  Yes  Angiulli, Lavon Paganini, PA-C  sertraline (ZOLOFT) 25 MG tablet Place 25 mg into feeding tube daily.   Yes [provider]  traMADol (ULTRAM) 50 MG tablet Place 1 tablet (50 mg total) into feeding tube every 12 (twelve) hours as needed for severe pain. 01/31/20  Yes Shelly Coss, MD  Water For Irrigation, Sterile (FREE WATER) SOLN Place 200 mLs into feeding tube 3 (three) times daily. Patient taking differently: Place 200 mLs into feeding tube in the morning, at noon, in the evening, and at bedtime.  01/10/20  Yes Angiulli, Lavon Paganini, PA-C  Nutritional Supplements (FEEDING SUPPLEMENT, JEVITY 1.2 CAL,) LIQD Place 1,000 mLs into feeding tube continuous. Continue at slow rate: 40 ml/hr Patient not taking: Reported on 02/27/2020 01/31/20   Shelly Coss, MD    Physical Exam: Vitals:   02/27/20 1115 02/27/20 1130 02/27/20 1145 02/27/20 1200  BP: (!) 156/68 (!) 149/72 (!) 166/69 (!) 144/70  Pulse: 92 90 89 88  Resp: (!) 36  (!) 34 (!) 33 (!) 32  Temp:      TempSrc:      SpO2: 100% 100% 98% 97%  Weight:      Height:        Constitutional: NAD, calm, comfortable Vitals:   02/27/20 1115 02/27/20 1130 02/27/20 1145 02/27/20 1200  BP: (!) 156/68 (!) 149/72 (!) 166/69 (!) 144/70  Pulse: 92 90 89 88  Resp: (!) 36 (!) 34 (!) 33 (!) 32  Temp:      TempSrc:      SpO2: 100% 100% 98% 97%  Weight:      Height:       Eyes: PERRL, lids and conjunctivae normal ENMT: Mucous membranes are dry. Posterior pharynx clear of any exudate or lesions.Normal dentition.  Neck: normal, supple, no masses, no thyromegaly Respiratory: Bilateral lower fields crackles crackles. Normal respiratory effort. No accessory muscle use.  Cardiovascular: Regular rate and rhythm, no murmurs / rubs / gallops. No extremity edema. 2+ pedal pulses. No carotid bruits.  Abdomen: no tenderness, no masses palpated. No hepatosplenomegaly. Bowel sounds positive.  Musculoskeletal: no clubbing / cyanosis. No joint deformity upper and lower extremities. Good ROM, no contractures. Normal muscle tone.  Skin: no rashes, lesions, ulcers. No induration Neurologic: Opens eyes, following simple command, moving all limbs. Psychiatric: Opens eyes to voice, sleepy    Labs on Admission: I have personally reviewed following labs and imaging studies  CBC: Recent Labs  Lab 02/27/20 0830  WBC 12.8*  NEUTROABS 10.2*  HGB 11.9*  HCT 40.3  MCV 95.3  PLT 545   Basic Metabolic Panel: Recent Labs  Lab 02/27/20 0830  NA 152*  K 3.5  CL 114*  CO2 26  GLUCOSE 239*  BUN 23  CREATININE 1.03*  CALCIUM 11.9*   GFR: Estimated Creatinine Clearance: 37.4 mL/min (A) (by C-G formula based on SCr of 1.03 mg/dL (H)). Liver Function Tests: Recent Labs  Lab 02/27/20 0830  AST 24  ALT 46*  ALKPHOS 100  BILITOT 0.6  PROT 7.5  ALBUMIN 2.9*   No results for input(s): LIPASE, AMYLASE in the last 168 hours. No results for input(s): AMMONIA in the last 168  hours. Coagulation Profile: Recent Labs  Lab 02/27/20 1022  INR 1.1   Cardiac Enzymes: No results for input(s): CKTOTAL, CKMB, CKMBINDEX, TROPONINI in the last 168 hours. BNP (last 3 results) No results for input(s): PROBNP in the last 8760 hours. HbA1C: No results for input(s): HGBA1C in the last 72 hours. CBG: No  results for input(s): GLUCAP in the last 168 hours. Lipid Profile: No results for input(s): CHOL, HDL, LDLCALC, TRIG, CHOLHDL, LDLDIRECT in the last 72 hours. Thyroid Function Tests: No results for input(s): TSH, T4TOTAL, FREET4, T3FREE, THYROIDAB in the last 72 hours. Anemia Panel: No results for input(s): VITAMINB12, FOLATE, FERRITIN, TIBC, IRON, RETICCTPCT in the last 72 hours. Urine analysis:    Component Value Date/Time   COLORURINE RED (A) 01/27/2020 1739   APPEARANCEUR CLOUDY (A) 01/27/2020 1739   LABSPEC 1.014 01/27/2020 1739   PHURINE 5.0 01/27/2020 1739   GLUCOSEU >=500 (A) 01/27/2020 1739   HGBUR NEGATIVE 01/27/2020 1739   BILIRUBINUR NEGATIVE 01/27/2020 1739   KETONESUR NEGATIVE 01/27/2020 1739   PROTEINUR 30 (A) 01/27/2020 1739   UROBILINOGEN 0.2 08/07/2015 1826   NITRITE NEGATIVE 01/27/2020 1739   LEUKOCYTESUR LARGE (A) 01/27/2020 1739    Radiological Exams on Admission: DG Chest Port 1 View  Result Date: 02/27/2020 CLINICAL DATA:  Altered mental status, shortness of breath EXAM: PORTABLE CHEST 1 VIEW COMPARISON:  01/30/2020 FINDINGS: Prior CABG. Left pacer is unchanged. Heart is normal size. Bibasilar atelectasis or infiltrates. No effusions or acute bony abnormality. IMPRESSION: Bibasilar atelectasis or infiltrates/consolidation. Electronically Signed   By: Rolm Baptise M.D.   On: 02/27/2020 09:50    EKG: Independently reviewed.  Paced  Assessment/Plan Active Problems:   * No active hospital problems. *  Sepsis -With signs of endorgan damage of encephalopathy as patient became more confused compared to her baseline, elevated white count as  well as increased breathing rate.  Source considered to be aspiration pneumonia plus acute parotitis.  Patient already received vancomycin cefepime in the ED, will redose of both with pharmacy, also add Flagyl to cover anaerobes.  Once hypoxia improved, can consider de-escalating -IVF, NPO  Acute vascular failure -From aspiration pneumonia, management as mentioned above  Acute parotitis -MRSA scan negative last month -ABX as above  Hypernatremia secondary to severe dehydration -Discontinue scopolamine -IVF with 0.45% NS  Hypercalcemia -From dehydration, IVF as above  Hypertension -BP stable, resume home meds  Dysphagia -Hold tube feeding, as needed Zofran -Continue free water flushing to help sodium level  DVT prophylaxis: Lovenox Code Status: Full code Family Communication: Daughter at bedside Disposition Plan: Patient sick, likely will need more than 2 midnight hospital stay Consults called: None Admission status: PCU   Lequita Halt MD [facility] Pager [phone]    02/27/2020, 12:33 PM
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
## 2020-02-27 NOTE — ED Triage Notes (Signed)
Patient presents to ed via [facility] patient is from [facility] per staff patient was fed throught her peg tube last pm and vomited, staff states she was fed again 20 prior to ems arrival and vomited. Patient sounds very congested. Mental status at baseline per staff. States sats were in the low 60's placed on nrb and increased sats to 96%. Warm to touch

## 2020-02-27 NOTE — ED Notes (Signed)
Patient switched to [place] at 4 liters per Dr. Karlton Lemon.

## 2020-02-27 NOTE — ED Notes (Signed)
Pt incontinent of urine, new brief and purewick applied.

## 2020-02-28 ENCOUNTER — Inpatient Hospital Stay (HOSPITAL_COMMUNITY): Payer: Medicare Other

## 2020-02-28 DIAGNOSIS — G9341 Metabolic encephalopathy: Secondary | ICD-10-CM

## 2020-02-28 DIAGNOSIS — E860 Dehydration: Secondary | ICD-10-CM

## 2020-02-28 DIAGNOSIS — E870 Hyperosmolality and hypernatremia: Secondary | ICD-10-CM

## 2020-02-28 DIAGNOSIS — K1121 Acute sialoadenitis: Secondary | ICD-10-CM

## 2020-02-28 DIAGNOSIS — D72829 Elevated white blood cell count, unspecified: Secondary | ICD-10-CM

## 2020-02-28 DIAGNOSIS — J9601 Acute respiratory failure with hypoxia: Secondary | ICD-10-CM

## 2020-02-28 DIAGNOSIS — A419 Sepsis, unspecified organism: Secondary | ICD-10-CM

## 2020-02-28 LAB — CBC
HCT: 38.6 % (ref 36.0–46.0)
Hemoglobin: 11 g/dL — ABNORMAL LOW (ref 12.0–15.0)
MCH: 27.2 pg (ref 26.0–34.0)
MCHC: 28.5 g/dL — ABNORMAL LOW (ref 30.0–36.0)
MCV: 95.5 fL (ref 80.0–100.0)
Platelets: 323 10*3/uL (ref 150–400)
RBC: 4.04 MIL/uL (ref 3.87–5.11)
RDW: 13.9 % (ref 11.5–15.5)
WBC: 10.3 10*3/uL (ref 4.0–10.5)
nRBC: 0 % (ref 0.0–0.2)

## 2020-02-28 LAB — MRSA PCR SCREENING: MRSA by PCR: NEGATIVE

## 2020-02-28 LAB — URINALYSIS, ROUTINE W REFLEX MICROSCOPIC
Bilirubin Urine: NEGATIVE
Glucose, UA: NEGATIVE mg/dL
Ketones, ur: NEGATIVE mg/dL
Nitrite: NEGATIVE
Protein, ur: NEGATIVE mg/dL
Specific Gravity, Urine: 1.033 — ABNORMAL HIGH (ref 1.005–1.030)
WBC, UA: >50 WBC/hpf — ABNORMAL HIGH (ref 0–5)
pH: 5 (ref 5.0–8.0)

## 2020-02-28 LAB — GLUCOSE, CAPILLARY
Glucose-Capillary: 150 mg/dL — ABNORMAL HIGH (ref 70–99)
Glucose-Capillary: 165 mg/dL — ABNORMAL HIGH (ref 70–99)
Glucose-Capillary: 202 mg/dL — ABNORMAL HIGH (ref 70–99)
Glucose-Capillary: 231 mg/dL — ABNORMAL HIGH (ref 70–99)
Glucose-Capillary: 234 mg/dL — ABNORMAL HIGH (ref 70–99)
Glucose-Capillary: 247 mg/dL — ABNORMAL HIGH (ref 70–99)

## 2020-02-28 LAB — BASIC METABOLIC PANEL
Anion gap: 10 (ref 5–15)
BUN: 30 mg/dL — ABNORMAL HIGH (ref 8–23)
CO2: 27 mmol/L (ref 22–32)
Calcium: 11.5 mg/dL — ABNORMAL HIGH (ref 8.9–10.3)
Chloride: 115 mmol/L — ABNORMAL HIGH (ref 98–111)
Creatinine, Ser: 1.2 mg/dL — ABNORMAL HIGH (ref 0.44–1.00)
GFR calc Af Amer: 46 mL/min — ABNORMAL LOW (ref >60)
GFR calc non Af Amer: 40 mL/min — ABNORMAL LOW (ref >60)
Glucose, Bld: 251 mg/dL — ABNORMAL HIGH (ref 70–99)
Potassium: 3.4 mmol/L — ABNORMAL LOW (ref 3.5–5.1)
Sodium: 152 mmol/L — ABNORMAL HIGH (ref 135–145)

## 2020-02-28 MED ORDER — FERROUS SULFATE 300 (60 FE) MG/5ML PO SYRP
300.0000 mg | ORAL_SOLUTION | Freq: Every day | ORAL | Status: DC
  Administered 2020-02-28 – 2020-03-06 (×8): 300 mg
  Filled 2020-02-28 (×8): qty 5

## 2020-02-28 MED ORDER — INSULIN ASPART 100 UNIT/ML ~~LOC~~ SOLN
0.0000 [IU] | SUBCUTANEOUS | Status: DC
  Administered 2020-02-28 (×2): 3 [IU] via SUBCUTANEOUS
  Administered 2020-02-28: 2 [IU] via SUBCUTANEOUS
  Administered 2020-02-28: 1 [IU] via SUBCUTANEOUS
  Administered 2020-02-29 (×3): 2 [IU] via SUBCUTANEOUS
  Administered 2020-02-29 (×2): 1 [IU] via SUBCUTANEOUS
  Administered 2020-03-01: 5 [IU] via SUBCUTANEOUS
  Administered 2020-03-01 (×6): 3 [IU] via SUBCUTANEOUS
  Administered 2020-03-02: 7 [IU] via SUBCUTANEOUS
  Administered 2020-03-02: 9 [IU] via SUBCUTANEOUS
  Administered 2020-03-02 (×2): 3 [IU] via SUBCUTANEOUS
  Administered 2020-03-02: 7 [IU] via SUBCUTANEOUS
  Administered 2020-03-02: 3 [IU] via SUBCUTANEOUS
  Administered 2020-03-03 (×6): 9 [IU] via SUBCUTANEOUS
  Administered 2020-03-04: 7 [IU] via SUBCUTANEOUS

## 2020-02-28 MED ORDER — ACETAMINOPHEN 160 MG/5ML PO SOLN
650.0000 mg | Freq: Four times a day (QID) | ORAL | Status: DC | PRN
  Administered 2020-03-04: 650 mg
  Filled 2020-02-28: qty 20.3

## 2020-02-28 MED ORDER — POTASSIUM CHLORIDE 10 MEQ/100ML IV SOLN
10.0000 meq | INTRAVENOUS | Status: AC
  Administered 2020-02-28 (×4): 10 meq via INTRAVENOUS
  Filled 2020-02-28 (×4): qty 100

## 2020-02-28 MED ORDER — INSULIN GLARGINE 100 UNIT/ML ~~LOC~~ SOLN
14.0000 [IU] | Freq: Every day | SUBCUTANEOUS | Status: DC
  Administered 2020-02-28 – 2020-03-01 (×3): 14 [IU] via SUBCUTANEOUS
  Filled 2020-02-28 (×4): qty 0.14

## 2020-02-28 MED ORDER — ACETAMINOPHEN 160 MG/5ML PO SOLN
500.0000 mg | Freq: Two times a day (BID) | ORAL | Status: DC
  Administered 2020-02-28 – 2020-03-06 (×15): 500 mg
  Filled 2020-02-28 (×15): qty 20.3

## 2020-02-28 MED ORDER — FREE WATER
300.0000 mL | Freq: Four times a day (QID) | Status: DC
  Administered 2020-02-28 – 2020-03-06 (×28): 300 mL

## 2020-02-28 NOTE — Progress Notes (Signed)
Inpatient Diabetes Program Recommendations  AACE/ADA: New Consensus Statement on Inpatient Glycemic Control   Target Ranges:  Prepandial:   less than 140 mg/dL      Peak postprandial:   less than 180 mg/dL (1-2 hours)      Critically ill patients:  140 - 180 mg/dL   Results for MACON, LESESNE (MRN [id number]) as of 02/28/2020 09:57  Ref. Range 02/27/2020 17:47 02/27/2020 23:53 02/28/2020 05:50  Glucose-Capillary Latest Ref Range: 70 - 99 mg/dL 256 (H) 231 (H) 234 (H)   Review of Glycemic Control  Diabetes history: DM2 Outpatient Diabetes medications: Lantus 25 units BID Current orders for Inpatient glycemic control: Novolog 0-9 units TID with meals; NPO  Inpatient Diabetes Program Recommendations:    Insulin-Basal: Please consider ordering Lantus 14 units Q24H.  Insulin-Correction: Please consider changing CBGs to Q4H and Novolog 0-9 units to Q4H since patient is NPO.  Thanks, Barnie Alderman, RN, MSN, CDE Diabetes Coordinator Inpatient Diabetes Program [phone] (Team Pager from 8am to 5pm)

## 2020-02-28 NOTE — Progress Notes (Signed)
   02/27/20 2300  Assess: MEWS Score  Temp 100 F (37.8 C)  BP (!) 144/58  Pulse Rate 82  ECG Heart Rate 82  Resp (!) 35  Level of Consciousness Alert  SpO2 93 %  O2 Device Non-rebreather Mask  O2 Flow Rate (L/min) 15 L/min  Assess: MEWS Score  MEWS Temp 0  MEWS Systolic 0  MEWS Pulse 0  MEWS RR 2  MEWS LOC 0  MEWS Score 2  MEWS Score Color Yellow  Assess: if the MEWS score is Yellow or Red  Were vital signs taken at a resting state? Yes  Focused Assessment Documented focused assessment  Early Detection of Sepsis Score *See Row Information* High  MEWS guidelines implemented *See Row Information* Yes  Treat  MEWS Interventions Administered scheduled meds/treatments;Consulted Respiratory Therapy  Take Vital Signs  Increase Vital Sign Frequency  Yellow: Q 2hr X 2 then Q 4hr X 2, if remains yellow, continue Q 4hrs  Escalate  MEWS: Escalate Yellow: discuss with charge nurse/RN and consider discussing with provider and RRT  Notify: Charge Nurse/RN  Name of Charge Nurse/RN Notified Janett Billow RN  Date Charge Nurse/RN Notified 02/27/20  Time Charge Nurse/RN Notified 2310  Notify: Provider  Provider Name/Title Wynetta Fines  Date Provider Notified 02/27/20  Time Provider Notified 2300  Notification Type  (MD is aware; MD notes)  Notification Reason Other (Comment)  Response See new orders  Document  Patient Outcome Not stable and remains on department

## 2020-02-28 NOTE — Consult Note (Signed)
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 Recent CVA with right side weakness.  Resolving deep tissue injury to left heel Aspiration pneumonia  Wound type:stage 3 pressure Pressure Injury POA: Yes Measurement:sacrum;  2 cm x 1 cm x 0.2 cm  Left heel;  2 cm newly epithelialized wound with center eschar present  Wound UXY:BFXOVA pink and moist Drainage (amount, consistency, odor) scant weeping Periwound:intact  Frequently moist perineal skin.  Purewick in place now.  Dressing procedure/placement/frequency: Cleanse sacral wound with soap and water. Apply silicone sacral foam.  Change every three days and PRN soilage.  Left heel with silicone foam and heel protectors bilaterally.  Will not follow at this time.  Please re-consult if needed.  Domenic Moras MSN, RN, FNP-BC CWON Wound, Ostomy, Continence Nurse Pager [phone]

## 2020-02-28 NOTE — Plan of Care (Signed)
  Problem: Pain Managment: Goal: General experience of comfort will improve Outcome: Progressing   Problem: Skin Integrity: Goal: Risk for impaired skin integrity will decrease Outcome: Progressing   Problem: Safety: Goal: Ability to remain free from injury will improve Outcome: Progressing

## 2020-02-28 NOTE — Progress Notes (Signed)
Patient was opening her eyes with voice and She was wearing NRB mask in the morning with 15 L, RT changed to [place] with 8 L then titrated down to 4 L with SpO2 93-96%. Patient didn't follow the commands, Rt. Side flaccid and Lt. Side responded with stimuli. NPO status due to vomiting last night once. She hasn't vomiting today. Patient's daughter called in the morning and updated. Another daughter came to see her and connected to Dr. Starla Link. RR is high and Dr. Starla Link made aware of it and continue to monitor. No residual from PEG tube. HS [employer]

## 2020-02-28 NOTE — Progress Notes (Signed)
Patient ID: Renee Rival, female   DOB: 05-May-1928, 86 y.o.   MRN: [id number]  PROGRESS NOTE    AHRIA SLAPPEY  SRP:[id number] DOB: Feb 24, 1928 DOA: 02/27/2020 PCP: [name], MD   Brief Narrative:  86 year old female with history of unspecified CVA with residual right-sided weakness and dysphagia requiring PEG tube feeding, CAD status post CABG, complete heart block status post pacemaker, diabetes mellitus type 2, sleep apnea, aspiration pneumonia requiring admission and discharge from 01/27/2020-02/03/2020 presented on 02/27/2020 from SNF for shortness of breath after few episodes of vomiting the night prior to presentation.  EMS found her saturation in the 60s and patient was placed on nonrebreather.  In the ED, chest x-ray showed bilateral lower lobe infiltrates.  CT facial and neck showed right parotid swelling compatible with acute parotitis.  Sodium was 152.  She was started on IV fluids and antibiotics.  Assessment & Plan:   Sepsis: Present on admission Probable aspiration pneumonia Acute parotitis, probably bacterial -Patient is currently ill looking.  Continue broad-spectrum antibiotics and current IV fluids.  Follow cultures. -We will continue MRSA coverage as bacterial peritonitis is usually MRSA related. -Still spiking temperatures  Acute hypoxic respiratory failure -Probably from aspiration.  Required nonrebreather on presentation.  Currently on 6 L oxygen via nasal cannula  Acute metabolic encephalopathy -Probably from above.  Patient has history of CVA with baseline right-sided weakness and dysphagia. -Mental status has worsened because of above.  Monitor mental status.  Fall precautions.  Leukocytosis -Improved.  Monitor  Hypernatremia Hypercalcemia Severe dehydration -Presented with sodium of 152.  Sodium is still 152 this morning.  Continue half-normal saline at 100 cc an hour. -Monitor electrolytes. -Continue free water flushes for now  Dysphagia -Tube  feedings on hold for now. -Continue free water flushes if patient is not vomiting  Hypertension -Blood pressure stable.  Continue amlodipine, Coreg  History of unspecified stroke with residual right-sided weakness and dysphagia -Continue aspirin, statin and Plavix. -Fall precautions.  Monitor mental status  Diabetes mellitus type 2 with hyperglycemia -Restart long-acting insulin at lower rate.  Continue CBGs with SSI.  History of coronary disease status post CABG History of complete heart block status post pacemaker -Continue aspirin, statin, Coreg, Plavix  Sleep apnea -Continue CPAP at night  Stage II mid sacral pressure ulcer present on admission -Consult wound care  Cognitive impairments -Continue Aricept  Generalized deconditioning -Overall prognosis is guarded to poor because of recurrent aspiration, hypernatremia, dehydration.  Currently full code.  Had a long discussion with both daughters on phone on 02/28/2020 and recommended that they consider changing patient's CODE STATUS to DNR.  If condition were to get worse, recommend hospice/comfort measures.   DVT prophylaxis: Lovenox Code Status:  Full code Family Communication: spoke to both the daughters on phone on 02/28/2020 Disposition Plan: Status is: Inpatient  Remains inpatient appropriate because:Hemodynamically unstable. Critically ill still requiring 6 L oxygen by nasal cannula with altered mental status, dehydration and still requiring IV antibiotics.   Dispo: The patient is from: SNF              Anticipated d/c is to: SNF              Anticipated d/c date is: > 3 days              Patient currently is not medically stable to d/c.   Consultants:  will request palliative care consultation  Procedures: None  Antimicrobials: Cefepime, vancomycin and Flagyl from 02/27/2020 onwards  Subjective: Patient seen and examined at bedside.  Wakes up very slightly, hardly answers any questions.  Spiking  temperatures.   Objective: Vitals:   02/28/20 0600 02/28/20 0715 02/28/20 0732 02/28/20 0800  BP: (!) 129/54 127/60  (!) 107/52  Pulse: 75 88  90  Resp: (!) 23 (!) 34  (!) 34  Temp: (!) 100.5 F (38.1 C) (!) 100.8 F (38.2 C)  100.2 F (37.9 C)  TempSrc: Oral Axillary  Oral  SpO2: 100% 100% 99% 95%  Weight:      Height:        Intake/Output Summary (Last 24 hours) at 02/28/2020 1036 Last data filed at 02/28/2020 0600 Gross per 24 hour  Intake 1400 ml  Output 0 ml  Net 1400 ml   Filed Weights   02/27/20 0832 02/27/20 2300  Weight: 74.9 kg 71.9 kg    Examination:  General exam: Elderly female.  Lying in bed.  Poor historian.  On 6 L oxygen via nasal cannula Respiratory system: Bilateral decreased breath sounds at bases with scattered crackles.  Tachypneic Cardiovascular system: S1 & S2 heard, Rate controlled Gastrointestinal system: Abdomen is nondistended, soft and nontender. Normal bowel sounds heard.  PEG tube present. Extremities: No cyanosis, clubbing; right upper extremity and bilateral trace lower extremity edema present Central nervous system: Drowsy, wakes up only very slightly, hardly answers any questions.  Lymph: No cervical lymphadenopathy Psychiatry: Could not be assessed because of mental status    Data Reviewed: I have personally reviewed following labs and imaging studies  CBC: Recent Labs  Lab 02/27/20 0830 02/28/20 0238  WBC 12.8* 10.3  NEUTROABS 10.2*  --   HGB 11.9* 11.0*  HCT 40.3 38.6  MCV 95.3 95.5  PLT 369 119   Basic Metabolic Panel: Recent Labs  Lab 02/27/20 0830 02/28/20 0238  NA 152* 152*  K 3.5 3.4*  CL 114* 115*  CO2 26 27  GLUCOSE 239* 251*  BUN 23 30*  CREATININE 1.03* 1.20*  CALCIUM 11.9* 11.5*   GFR: Estimated Creatinine Clearance: 32.1 mL/min (A) (by C-G formula based on SCr of 1.2 mg/dL (H)). Liver Function Tests: Recent Labs  Lab 02/27/20 0830  AST 24  ALT 46*  ALKPHOS 100  BILITOT 0.6  PROT 7.5   ALBUMIN 2.9*   No results for input(s): LIPASE, AMYLASE in the last 168 hours. No results for input(s): AMMONIA in the last 168 hours. Coagulation Profile: Recent Labs  Lab 02/27/20 1022  INR 1.1   Cardiac Enzymes: No results for input(s): CKTOTAL, CKMB, CKMBINDEX, TROPONINI in the last 168 hours. BNP (last 3 results) No results for input(s): PROBNP in the last 8760 hours. HbA1C: No results for input(s): HGBA1C in the last 72 hours. CBG: Recent Labs  Lab 02/27/20 1747 02/27/20 2353 02/28/20 0550  GLUCAP 256* 231* 234*   Lipid Profile: No results for input(s): CHOL, HDL, LDLCALC, TRIG, CHOLHDL, LDLDIRECT in the last 72 hours. Thyroid Function Tests: No results for input(s): TSH, T4TOTAL, FREET4, T3FREE, THYROIDAB in the last 72 hours. Anemia Panel: No results for input(s): VITAMINB12, FOLATE, FERRITIN, TIBC, IRON, RETICCTPCT in the last 72 hours. Sepsis Labs: Recent Labs  Lab 02/27/20 0830 02/27/20 1022  LATICACIDVEN 1.7 1.4    Recent Results (from the past 240 hour(s))  SARS Coronavirus 2 by RT PCR (hospital order, performed in [facility] hospital lab) Nasopharyngeal Nasopharyngeal Swab     Status: None   Collection Time: 02/27/20  9:50 AM   Specimen: Nasopharyngeal Swab  Result Value  Ref Range Status   SARS Coronavirus 2 NEGATIVE NEGATIVE Final    Comment: (NOTE) SARS-CoV-2 target nucleic acids are NOT DETECTED. The SARS-CoV-2 RNA is generally detectable in upper and lower respiratory specimens during the acute phase of infection. The lowest concentration of SARS-CoV-2 viral copies this assay can detect is 250 copies / mL. A negative result does not preclude SARS-CoV-2 infection and should not be used as the sole basis for treatment or other patient management decisions.  A negative result may occur with improper specimen collection / handling, submission of specimen other than nasopharyngeal swab, presence of viral mutation(s) within the areas targeted by  this assay, and inadequate number of viral copies (<250 copies / mL). A negative result must be combined with clinical observations, patient history, and epidemiological information. Fact Sheet for Patients:   StrictlyIdeas.no Fact Sheet for Healthcare Providers: BankingDealers.co.za This test is not yet approved or cleared  by the [place] FDA and has been authorized for detection and/or diagnosis of SARS-CoV-2 by FDA under an Emergency Use Authorization (EUA).  This EUA will remain in effect (meaning this test can be used) for the duration of the COVID-19 declaration under Section 564(b)(1) of the Act, 21 U.S.C. section 360bbb-3(b)(1), unless the authorization is terminated or revoked sooner. Performed at [facility], [place] [address], [place], [place] [address]   Culture, blood (routine x 2)     Status: None (Preliminary result)   Collection Time: 02/27/20  9:59 AM   Specimen: BLOOD RIGHT WRIST  Result Value Ref Range Status   Specimen Description BLOOD RIGHT WRIST  Final   Special Requests   Final    BOTTLES DRAWN AEROBIC AND ANAEROBIC Blood Culture results may not be optimal due to an inadequate volume of blood received in culture bottles   Culture   Final    NO GROWTH < 24 HOURS Performed at [facility], [place] [address] [place], [place] [address]    Report Status PENDING  Incomplete  Culture, blood (routine x 2)     Status: None (Preliminary result)   Collection Time: 02/27/20 10:22 AM   Specimen: BLOOD  Result Value Ref Range Status   Specimen Description BLOOD SITE NOT SPECIFIED  Final   Special Requests   Final    BOTTLES DRAWN AEROBIC AND ANAEROBIC Blood Culture results may not be optimal due to an excessive volume of blood received in culture bottles   Culture   Final    NO GROWTH < 24 HOURS Performed at [facility], [place] [address], [place], [place] [address]    Report Status PENDING  Incomplete   MRSA PCR Screening     Status: None   Collection Time: 02/27/20 11:16 PM   Specimen: Nasal Mucosa; Nasopharyngeal  Result Value Ref Range Status   MRSA by PCR NEGATIVE NEGATIVE Final    Comment:        The GeneXpert MRSA Assay (FDA approved for NASAL specimens only), is one component of a comprehensive MRSA colonization surveillance program. It is not intended to diagnose MRSA infection nor to guide or monitor treatment for MRSA infections. Performed at [facility], [place] [address], [place], [place] [address]          Radiology Studies: CT Soft Tissue Neck W Contrast  Result Date: 02/27/2020 CLINICAL DATA:  Sialadenitis. Vomiting. EXAM: CT NECK WITH CONTRAST TECHNIQUE: Multidetector CT imaging of the neck was performed using the standard protocol following the bolus administration  of intravenous contrast. CONTRAST:  47m OMNIPAQUE IOHEXOL 300 MG/ML  SOLN COMPARISON:  Neck CTA 07/01/2019 FINDINGS: Pharynx and larynx: Limited assessment due to motion artifact throughout the pharynx and larynx. No gross mass. Patent airway. No retropharyngeal fluid. Salivary glands: Asymmetric enlargement and heterogeneous hyperenhancement of the right parotid gland with mild inflammatory stranding in the parotid space and overlying subcutaneous tissues extending inferiorly in the face. No salivary stone or fluid collection identified. Unremarkable left parotid gland. Limited assessment of the submandibular glands due to motion. Thyroid: Asymmetric enlargement of the right thyroid lobe which extends into the retropharyngeal region with an underlying 3.9 cm nodule, unchanged from the prior CTA and previously evaluated by ultrasound and biopsy. Lymph nodes: No enlarged or suspicious lymph nodes in the neck. Vascular: Aortic and carotid atherosclerosis. Limited assessment of known proximal right ICA stenosis due to motion artifact. Limited intracranial: Unremarkable. Visualized orbits: Bilateral cataract  extraction and glaucoma drainage devices. Mastoids and visualized paranasal sinuses: Minimal left maxillary sinus mucosal thickening. Clear mastoid air cells. Skeleton: Left greater than right cervical facet arthrosis with slight anterolisthesis of C4 on C5 and C5 on C6. Left facet ankylosis at C2-3. Upper chest: Motion artifact through the lung apices with partially visualized patchy opacities in the right upper lobe and superior segment of the right lower lobe. CABG. Pacemaker. Other: None. IMPRESSION: 1. Acute right parotitis.  No salivary stone or fluid collection. 2. Partially visualized patchy pulmonary opacities in the right upper and lower lobes compatible with pneumonia. 3. 3.9 cm right thyroid nodule. This has been documented on prior studies. 4. Aortic Atherosclerosis (ICD10-I70.0). Electronically Signed   By: ALogan BoresM.D.   On: 02/27/2020 13:15   Portable chest 1 View  Result Date: 02/28/2020 CLINICAL DATA:  Pneumonia EXAM: PORTABLE CHEST 1 VIEW COMPARISON:  Yesterday FINDINGS: Indistinct opacity at the lung bases. Aeration is worse compared yesterday, with more poorly defined left diaphragm. Biventricular pacer and CABG. Leftward deviation of the trachea from large thyroid nodule by CT last month IMPRESSION: Atelectasis or infection at the bases with worsening aeration since yesterday. Electronically Signed   By: JMonte FantasiaM.D.   On: 02/28/2020 09:10   DG Chest Port 1 View  Result Date: 02/27/2020 CLINICAL DATA:  Altered mental status, shortness of breath EXAM: PORTABLE CHEST 1 VIEW COMPARISON:  01/30/2020 FINDINGS: Prior CABG. Left pacer is unchanged. Heart is normal size. Bibasilar atelectasis or infiltrates. No effusions or acute bony abnormality. IMPRESSION: Bibasilar atelectasis or infiltrates/consolidation. Electronically Signed   By: KRolm BaptiseM.D.   On: 02/27/2020 09:50        Scheduled Meds: . acetaminophen (TYLENOL) oral liquid 160 mg/5 mL  500 mg Per Tube Q12H   . amLODipine  10 mg Per Tube Daily  . aspirin  81 mg Per J Tube Daily  . atorvastatin  80 mg Per Tube QHS  . brimonidine  1 drop Both Eyes BID  . carvedilol  3.125 mg Per Tube BID  . chlorhexidine  15 mL Mouth/Throat QID  . clopidogrel  75 mg Per Tube Daily  . donepezil  10 mg Per Tube Daily  . dorzolamide  1 drop Both Eyes TID  . enoxaparin (LOVENOX) injection  40 mg Subcutaneous Q24H  . ferrous sulfate  300 mg Per Tube Q breakfast  . free water  300 mL Per Tube Q6H  . insulin aspart  0-9 Units Subcutaneous TID WC  . ipratropium-albuterol  3 mL Nebulization Q6H  . lactobacillus acidophilus  2 tablet Oral TID  . latanoprost  1 drop Both Eyes QHS  . Muscle Rub  1 application Topical O53G  . pantoprazole sodium  40 mg Per Tube QAC breakfast  . polyethylene glycol  17 g Per Tube BID  . senna-docusate  1 tablet Per Tube BID  . sertraline  25 mg Per Tube Daily  . sodium chloride flush  3 mL Intravenous Once   Continuous Infusions: . sodium chloride 100 mL/hr at 02/28/20 0600  . ceFEPime (MAXIPIME) IV 2 g (02/28/20 0949)  . metronidazole 500 mg (02/28/20 0520)  . vancomycin            Aline August, MD [facility] 02/28/2020, 10:36 AM

## 2020-02-29 DIAGNOSIS — Z515 Encounter for palliative care: Secondary | ICD-10-CM

## 2020-02-29 DIAGNOSIS — Z66 Do not resuscitate: Secondary | ICD-10-CM

## 2020-02-29 DIAGNOSIS — R627 Adult failure to thrive: Secondary | ICD-10-CM

## 2020-02-29 LAB — URINE CULTURE: Culture: >=100000 — AB

## 2020-02-29 LAB — GLUCOSE, CAPILLARY
Glucose-Capillary: 124 mg/dL — ABNORMAL HIGH (ref 70–99)
Glucose-Capillary: 138 mg/dL — ABNORMAL HIGH (ref 70–99)
Glucose-Capillary: 170 mg/dL — ABNORMAL HIGH (ref 70–99)
Glucose-Capillary: 173 mg/dL — ABNORMAL HIGH (ref 70–99)
Glucose-Capillary: 194 mg/dL — ABNORMAL HIGH (ref 70–99)
Glucose-Capillary: 226 mg/dL — ABNORMAL HIGH (ref 70–99)

## 2020-02-29 LAB — BASIC METABOLIC PANEL
Anion gap: 7 (ref 5–15)
BUN: 36 mg/dL — ABNORMAL HIGH (ref 8–23)
CO2: 25 mmol/L (ref 22–32)
Calcium: 10.6 mg/dL — ABNORMAL HIGH (ref 8.9–10.3)
Chloride: 112 mmol/L — ABNORMAL HIGH (ref 98–111)
Creatinine, Ser: 1.24 mg/dL — ABNORMAL HIGH (ref 0.44–1.00)
GFR calc Af Amer: 45 mL/min — ABNORMAL LOW (ref >60)
GFR calc non Af Amer: 38 mL/min — ABNORMAL LOW (ref >60)
Glucose, Bld: 153 mg/dL — ABNORMAL HIGH (ref 70–99)
Potassium: 3.2 mmol/L — ABNORMAL LOW (ref 3.5–5.1)
Sodium: 144 mmol/L (ref 135–145)

## 2020-02-29 LAB — CBC
HCT: 30.5 % — ABNORMAL LOW (ref 36.0–46.0)
Hemoglobin: 9 g/dL — ABNORMAL LOW (ref 12.0–15.0)
MCH: 27.4 pg (ref 26.0–34.0)
MCHC: 29.5 g/dL — ABNORMAL LOW (ref 30.0–36.0)
MCV: 92.7 fL (ref 80.0–100.0)
Platelets: 288 10*3/uL (ref 150–400)
RBC: 3.29 MIL/uL — ABNORMAL LOW (ref 3.87–5.11)
RDW: 13.8 % (ref 11.5–15.5)
WBC: 17.4 10*3/uL — ABNORMAL HIGH (ref 4.0–10.5)
nRBC: 0 % (ref 0.0–0.2)

## 2020-02-29 MED ORDER — KCL IN DEXTROSE-NACL 20-5-0.45 MEQ/L-%-% IV SOLN
INTRAVENOUS | Status: DC
  Filled 2020-02-29 (×3): qty 1000

## 2020-02-29 MED ORDER — FLUCONAZOLE 40 MG/ML PO SUSR
100.0000 mg | Freq: Every day | ORAL | Status: DC
  Administered 2020-02-29 – 2020-03-01 (×2): 100 mg
  Filled 2020-02-29 (×3): qty 2.5

## 2020-02-29 MED ORDER — IPRATROPIUM-ALBUTEROL 0.5-2.5 (3) MG/3ML IN SOLN
3.0000 mL | Freq: Three times a day (TID) | RESPIRATORY_TRACT | Status: DC
  Administered 2020-03-01 – 2020-03-04 (×9): 3 mL via RESPIRATORY_TRACT
  Filled 2020-02-29 (×9): qty 3

## 2020-02-29 NOTE — Plan of Care (Signed)
  Problem: Pain Managment: Goal: General experience of comfort will improve Outcome: Progressing   Problem: Skin Integrity: Goal: Risk for impaired skin integrity will decrease Outcome: Progressing   Problem: Elimination: Goal: Will not experience complications related to urinary retention Outcome: Progressing   Problem: Clinical Measurements: Goal: Diagnostic test results will improve Outcome: Progressing   Problem: Clinical Measurements: Goal: Ability to maintain clinical measurements within normal limits will improve Outcome: Progressing

## 2020-02-29 NOTE — Progress Notes (Addendum)
Patient ID: [name], female   DOB: 13-Jun-1928, 86 y.o.   MRN: [id number]  PROGRESS NOTE    MARIKA MAHAFFY  KPV:[id number] DOB: [birth year] DOA: 02/27/2020 PCP: [name], MD   Brief Narrative:  86 year old female with history of unspecified CVA with residual right-sided weakness and dysphagia requiring PEG tube feeding, CAD status post CABG, complete heart block status post pacemaker, diabetes mellitus type 2, sleep apnea, aspiration pneumonia requiring admission and discharge from 01/27/2020-02/03/2020 presented on 02/27/2020 from SNF for shortness of breath after few episodes of vomiting the night prior to presentation.  EMS found her saturation in the 60s and patient was placed on nonrebreather.  In the ED, chest x-ray showed bilateral lower lobe infiltrates.  CT facial and neck showed right parotid swelling compatible with acute parotitis.  Sodium was 152.  She was started on IV fluids and antibiotics.  Assessment & Plan: Sepsis due to probable aspiration pneumonia as well as bacterial acute parotitis:  - Continue broad-spectrum antibiotics including MRSA coverage and current IV fluids.  Follow cultures.  Acute hypoxic respiratory failure: Improving. - Continue supplemental oxygen to maintain SpO2 >90% and normal respiratory effort. Currently 2L O2.  Acute metabolic encephalopathy on chronic dementia: Suspected to be due to severity of illness on baseline cerebrovascular disease. Has baseline right-sided weakness due to prior CVA. - Delirium precautions.  - Continue aricept  Leukocytosis: Worsened though fever curve has improved  Yeast infection: Possibly infection of urinary tract vs. cross contamination from vulvovaginal candidiasis.  - Give diflucan especially in light of continued broad spectrum abx.  Hypernatremia: Improved when free water restarted.   Hypercalcemia, severe dehydration - Continue half-normal saline at 100 cc an hour.  Hypokalemia:  - Supplement in  IVF  AKI on stage IIIa CKD: Presumed based on prior Cr values.  - Continue monitoring, start IVF.  Dysphagia - Tube feedings on hold for now, plan to restart at low rate depending on clinical progress over next 24 hours. - Continue free water flushes if patient is not vomiting  Hypertension - Continue amlodipine, Coreg  History of unspecified stroke with residual right-sided weakness and dysphagia - Continue DAPT and statin  Diabetes mellitus type 2 with hyperglycemia: Recent HbA1c 8.6%. - Continue basal-bolus insulin. - Continue CBGs with SSI.  CAD s/p CABG, CHB s/p PPM:  - Continue aspirin, plavix, statin, coreg   Sleep apnea - Continue CPAP at night  Stage 3 sacral pressure injury including gluteal fold, resolving DTI to left heel, POA:  - WOC consulted, local wound care and offloading.   Generalized deconditioning - Overall prognosis is poor. Palliative care consulted and guiding family discussions. Unclear whether patient would be able to rehabilitate at SNF. Would be appropriate for home hospice.   DVT prophylaxis: Lovenox Code Status:  DNR per palliative care discussions Family Communication: Daughters updated 5/11, spoke with palliative care 5/12. Family meeting planned 5/13. Disposition Plan: Status is: Inpatient  Remains inpatient appropriate because:Hemodynamically unstable. Critically ill still requiring 6 L oxygen by nasal cannula with altered mental status, dehydration and still requiring IV antibiotics.   Dispo: The patient is from: SNF              Anticipated d/c is to: TBD. SNF vs. home hospice              Anticipated d/c date is: > 3 days              Patient currently is not medically stable to d/c.  Consultants: Palliative care medicine team  Procedures: None  Antimicrobials: Cefepime, vancomycin, flagyl 02/27/2020 >>  Subjective: Minimal response to questions, does not confirm any questions of pain or dyspnea or  otherwise.  Objective: Vitals:   02/29/20 0750 02/29/20 0800 02/29/20 1221 02/29/20 1402  BP:  (!) 120/55 (!) 125/52 (!) 125/52  Pulse:  70 73 73  Resp:  18 19 16   Temp:   98.1 F (36.7 C)   TempSrc:   Oral   SpO2: 98%   100%  Weight:      Height:        Intake/Output Summary (Last 24 hours) at 02/29/2020 1507 Last data filed at 02/29/2020 0349 Gross per 24 hour  Intake 2024.02 ml  Output --  Net 2024.02 ml   Filed Weights   02/27/20 0832 02/27/20 2300  Weight: 74.9 kg 71.9 kg   Examination: Gen: Elderly ill-appearing female in no distress Pulm: Nonlabored tachypnea with scattered crackles, diminished. No wheezes. CV: Regular rate and rhythm. No murmur, rub, or gallop. No JVD, right sided edema. GI: Abdomen soft, non-tender, non-distended, with normoactive bowel sounds.  Ext: Warm, no deformities Skin: No new rashes, lesions or ulcers on visualized skin. PEG site c/d/i Neuro: Rousable but minimally interactive, nonverbal. Not cooperative with exam. Psych: Judgement and insight appear impaired.    Data Reviewed: I have personally reviewed following labs and imaging studies  CBC: Recent Labs  Lab 02/27/20 0830 02/28/20 0238 02/29/20 1145  WBC 12.8* 10.3 17.4*  NEUTROABS 10.2*  --   --   HGB 11.9* 11.0* 9.0*  HCT 40.3 38.6 30.5*  MCV 95.3 95.5 92.7  PLT 369 323 765   Basic Metabolic Panel: Recent Labs  Lab 02/27/20 0830 02/28/20 0238 02/29/20 1145  NA 152* 152* 144  K 3.5 3.4* 3.2*  CL 114* 115* 112*  CO2 26 27 25   GLUCOSE 239* 251* 153*  BUN 23 30* 36*  CREATININE 1.03* 1.20* 1.24*  CALCIUM 11.9* 11.5* 10.6*   Liver Function Tests: Recent Labs  Lab 02/27/20 0830  AST 24  ALT 46*  ALKPHOS 100  BILITOT 0.6  PROT 7.5  ALBUMIN 2.9*   Coagulation Profile: Recent Labs  Lab 02/27/20 1022  INR 1.1   CBG: Recent Labs  Lab 02/28/20 1919 02/28/20 2317 02/28/20 2357 02/29/20 0347 02/29/20 0900  GLUCAP 202* 165* 170* 138* 124*   Sepsis  Labs: Recent Labs  Lab 02/27/20 0830 02/27/20 1022  LATICACIDVEN 1.7 1.4    Recent Results (from the past 240 hour(s))  SARS Coronavirus 2 by RT PCR (hospital order, performed in [facility] hospital lab) Nasopharyngeal Nasopharyngeal Swab     Status: None   Collection Time: 02/27/20  9:50 AM   Specimen: Nasopharyngeal Swab  Result Value Ref Range Status   SARS Coronavirus 2 NEGATIVE NEGATIVE Final    Comment: (NOTE) SARS-CoV-2 target nucleic acids are NOT DETECTED. The SARS-CoV-2 RNA is generally detectable in upper and lower respiratory specimens during the acute phase of infection. The lowest concentration of SARS-CoV-2 viral copies this assay can detect is 250 copies / mL. A negative result does not preclude SARS-CoV-2 infection and should not be used as the sole basis for treatment or other patient management decisions.  A negative result may occur with improper specimen collection / handling, submission of specimen other than nasopharyngeal swab, presence of viral mutation(s) within the areas targeted by this assay, and inadequate number of viral copies (<250 copies / mL). A negative result must be combined  with clinical observations, patient history, and epidemiological information. Fact Sheet for Patients:   StrictlyIdeas.no Fact Sheet for Healthcare Providers: BankingDealers.co.za This test is not yet approved or cleared  by the [place] FDA and has been authorized for detection and/or diagnosis of SARS-CoV-2 by FDA under an Emergency Use Authorization (EUA).  This EUA will remain in effect (meaning this test can be used) for the duration of the COVID-19 declaration under Section 564(b)(1) of the Act, 21 U.S.C. section 360bbb-3(b)(1), unless the authorization is terminated or revoked sooner. Performed at [facility], [place] [address] [place], [place] [address]   Culture, blood (routine x 2)     Status: None  (Preliminary result)   Collection Time: 02/27/20  9:59 AM   Specimen: BLOOD RIGHT WRIST  Result Value Ref Range Status   Specimen Description BLOOD RIGHT WRIST  Final   Special Requests   Final    BOTTLES DRAWN AEROBIC AND ANAEROBIC Blood Culture results may not be optimal due to an inadequate volume of blood received in culture bottles   Culture   Final    NO GROWTH 2 DAYS Performed at [facility], [place] [address] [place], [place] [address]    Report Status PENDING  Incomplete  Culture, blood (routine x 2)     Status: None (Preliminary result)   Collection Time: 02/27/20 10:22 AM   Specimen: BLOOD  Result Value Ref Range Status   Specimen Description BLOOD SITE NOT SPECIFIED  Final   Special Requests   Final    BOTTLES DRAWN AEROBIC AND ANAEROBIC Blood Culture results may not be optimal due to an excessive volume of blood received in culture bottles   Culture   Final    NO GROWTH 2 DAYS Performed at [facility], [place] [address], [place], [place] [address]    Report Status PENDING  Incomplete  MRSA PCR Screening     Status: None   Collection Time: 02/27/20 11:16 PM   Specimen: Nasal Mucosa; Nasopharyngeal  Result Value Ref Range Status   MRSA by PCR NEGATIVE NEGATIVE Final    Comment:        The GeneXpert MRSA Assay (FDA approved for NASAL specimens only), is one component of a comprehensive MRSA colonization surveillance program. It is not intended to diagnose MRSA infection nor to guide or monitor treatment for MRSA infections. Performed at [facility], [place] [address], [place], [place] [address]   Urine culture     Status: Abnormal   Collection Time: 02/28/20  7:10 AM   Specimen: In/Out Cath Urine  Result Value Ref Range Status   Specimen Description IN/OUT CATH URINE  Final   Special Requests   Final    NONE Performed at [facility], [place] [address], [place], [place] [address]    Culture >=100,000 COLONIES/mL YEAST (A)  Final   Report  Status 02/29/2020 FINAL  Final    Radiology Studies: Portable chest 1 View  Result Date: 02/28/2020 CLINICAL DATA:  Pneumonia EXAM: PORTABLE CHEST 1 VIEW COMPARISON:  Yesterday FINDINGS: Indistinct opacity at the lung bases. Aeration is worse compared yesterday, with more poorly defined left diaphragm. Biventricular pacer and CABG. Leftward deviation of the trachea from large thyroid nodule by CT last month IMPRESSION: Atelectasis or infection at the bases with worsening aeration since yesterday. Electronically Signed   By: Monte Fantasia M.D.   On: 02/28/2020 09:10   Scheduled Meds: . acetaminophen (TYLENOL) oral liquid 160  mg/5 mL  500 mg Per Tube Q12H  . amLODipine  10 mg Per Tube Daily  . aspirin  81 mg Per J Tube Daily  . atorvastatin  80 mg Per Tube QHS  . brimonidine  1 drop Both Eyes BID  . carvedilol  3.125 mg Per Tube BID  . chlorhexidine  15 mL Mouth/Throat QID  . clopidogrel  75 mg Per Tube Daily  . donepezil  10 mg Per Tube Daily  . dorzolamide  1 drop Both Eyes TID  . enoxaparin (LOVENOX) injection  40 mg Subcutaneous Q24H  . ferrous sulfate  300 mg Per Tube Q breakfast  . free water  300 mL Per Tube Q6H  . insulin aspart  0-9 Units Subcutaneous Q4H  . insulin glargine  14 Units Subcutaneous Daily  . ipratropium-albuterol  3 mL Nebulization Q6H  . lactobacillus acidophilus  2 tablet Oral TID  . latanoprost  1 drop Both Eyes QHS  . Muscle Rub  1 application Topical Y17C  . pantoprazole sodium  40 mg Per Tube QAC breakfast  . polyethylene glycol  17 g Per Tube BID  . senna-docusate  1 tablet Per Tube BID  . sertraline  25 mg Per Tube Daily  . sodium chloride flush  3 mL Intravenous Once   Continuous Infusions: . ceFEPime (MAXIPIME) IV 2 g (02/29/20 1145)  . metronidazole 500 mg (02/29/20 0502)  . vancomycin 1,000 mg (02/28/20 1136)   Time spent: 35 minutes  Patrecia Pour, MD [facility] 02/29/2020, 3:07 PM

## 2020-02-29 NOTE — Consult Note (Signed)
Consultation Note Date: 02/29/2020   Patient Name: [name]  DOB: Feb 24, 1928  MRN: [id number]  Age / Sex: 86 y.o., female  PCP: [name], MD Referring Physician: Patrecia Pour, MD  Reason for Consultation: Establishing goals of care and Psychosocial/spiritual support  HPI/Patient Profile: 86 y.o. female   admitted on 02/27/2020 with significant past  medical history significant for  stroke, DM, CHF with EF 50 to 55%, HTN, HLD, dysphagia s/p PEG tube, complete heart block with PPM, CKD stage II, memory loss, presented with vomiting and short of breath.    Patient vomited last night as well as this morning  with tube feeding material.  Noted to have symptoms of dyspnea after the second vomiting this morning.  EMS arrived found the patient O2 saturation in the 60s, patient was placed on nonrebreather and transported to ER.  ED Course: O2 stabilized on 10 L of oxygen.  Spiking fever 100.4.  X-ray showed bilateral lower field infiltrates, CT facial and neck showing right parotid swelling compatible with acute parotitis.  Sodium 132, calcium 11.9  Per family patient has had continued physical, functional and cognitive decline over the past several months. She was discharged from [facility]/ 01/10/20 and transitioned to [facility] for ongoing rehab.  Unfortunately she made little progress.  Currently patient is lethargic, non verbal and unable to follow commands.  Overall failure to thrive.   Family face treatment option decisions, advanced directive decisions and anticipatory care needs.    Clinical Assessment and Goals of Care:  This nurse practitioner reviewed medical records, received report from team, assessed the patient and then met at the patient's bedside along with her family to include her daughter Maxwell Marion, daughter Kathie Rhodes, and granddaughter, Tania Ade to discuss  current medical situation, goals of care, end-of-life wishes, disposition and options.  Created space and opportunity for family to explore their thoughts and feelings regarding the patient's current medical situation.  Education offered on diagnosis, treatment options and prognosis/natrual trajectory and expectations.  Education offered  regarding concepts specific to adult failure to thrive and the limitations of medical interventions to prolong quality of life when a body begins to fail.  We discussed human mortality.  All family verbalize an understanding of the seriousness of the current medical situation. Hope is for comfort and dignity.   No documented healthcare power of attorney or advanced directive.  Her family  listed above all work in unison to make decisions for the patient's best interest.     SUMMARY OF RECOMMENDATIONS    Code Status/Advance Care Planning:  DNR/DNI-documented today   Palliative Prophylaxis:   Aspiration precautions, pain assessment  Additional Recommendations (Limitations, Scope, Preferences):  Continue current medical interventions to treat the treatable, family is considering a more comfort forced path  Re-meet tomorrow at 0900 for further clarification of GOCs  Psycho-social/Spiritual:   Desire for further Chaplaincy support: yes  Additional Recommendations: Education of hospice and emotional support  Prognosis:   Depends on desire for life prolonging measures  Discharge Planning:   --To be determined      Primary Diagnoses: Present on Admission: . Hypoxia   I have reviewed the medical record, interviewed the patient and family, and examined the patient. The following aspects are pertinent.  Past Medical History:  Diagnosis Date  . Anemia   . Arthritis   . Celiac disease   . Chronic systolic CHF (congestive heart failure) ([place])   . Complete heart block ([place])   . Coronary artery disease   . Diabetes mellitus     INSULIN DEPENDENT  . GERD (gastroesophageal reflux disease)   . Glaucoma   . Headache(784.0)   . Heart disease   . Hypercholesterolemia   . Hypertension   . Iron deficiency anemia, unspecified   . Ischemic cardiomyopathy   . Memory difficulties 04/14/2019  . Shortness of breath   . Sleep apnea    uses cpap  . Stroke ([place])   . Unspecified constipation    Social History   Socioeconomic History  . Marital status: Widowed    Spouse name: Not on file  . Number of children: 4  . Years of education: 74  . Highest education level: Not on file  Occupational History  . Occupation: Retired  Tobacco Use  . Smoking status: Former Smoker    Quit date: 12/26/1976    Years since quitting: 43.2  . Smokeless tobacco: Never Used  Substance and Sexual Activity  . Alcohol use: No    Alcohol/week: 0.0 standard drinks  . Drug use: No  . Sexual activity: Never  Other Topics Concern  . Not on file  Social History Narrative   Regular exercise-no   Caffeine Use-yes   Patient lives at home at home with [name] her daughter.   Retired   Right handed   Education 12th   Caffeine one cup of coffee daily   Social Determinants of Radio broadcast assistant Strain:   . Difficulty of Paying Living Expenses:   Food Insecurity:   . Worried About Charity fundraiser in the Last Year:   . Arboriculturist in the Last Year:   Transportation Needs:   . Film/video editor (Medical):   Marland Kitchen Lack of Transportation (Non-Medical):   Physical Activity:   . Days of Exercise per Week:   . Minutes of Exercise per Session:   Stress:   . Feeling of Stress :   Social Connections:   . Frequency of Communication with Friends and Family:   . Frequency of Social Gatherings with Friends and Family:   . Attends Religious Services:   . Active Member of Clubs or Organizations:   . Attends Archivist Meetings:   Marland Kitchen Marital Status:    Family History  Problem Relation Age of Onset  . Diabetes Mother    . Hypertension Mother   . Hyperlipidemia Mother   . Cancer Sister   . Dementia Sister   . Neuropathy Sister    Scheduled Meds: . acetaminophen (TYLENOL) oral liquid 160 mg/5 mL  500 mg Per Tube Q12H  . amLODipine  10 mg Per Tube Daily  . aspirin  81 mg Per J Tube Daily  . atorvastatin  80 mg Per Tube QHS  . brimonidine  1 drop Both Eyes BID  . carvedilol  3.125 mg Per Tube BID  . chlorhexidine  15 mL Mouth/Throat QID  . clopidogrel  75 mg Per Tube Daily  . donepezil  10 mg Per Tube Daily  .  dorzolamide  1 drop Both Eyes TID  . enoxaparin (LOVENOX) injection  40 mg Subcutaneous Q24H  . ferrous sulfate  300 mg Per Tube Q breakfast  . free water  300 mL Per Tube Q6H  . insulin aspart  0-9 Units Subcutaneous Q4H  . insulin glargine  14 Units Subcutaneous Daily  . ipratropium-albuterol  3 mL Nebulization Q6H  . lactobacillus acidophilus  2 tablet Oral TID  . latanoprost  1 drop Both Eyes QHS  . Muscle Rub  1 application Topical W65K  . pantoprazole sodium  40 mg Per Tube QAC breakfast  . polyethylene glycol  17 g Per Tube BID  . senna-docusate  1 tablet Per Tube BID  . sertraline  25 mg Per Tube Daily  . sodium chloride flush  3 mL Intravenous Once   Continuous Infusions: . ceFEPime (MAXIPIME) IV 2 g (02/28/20 2251)  . metronidazole 500 mg (02/29/20 0502)  . vancomycin 1,000 mg (02/28/20 1136)   PRN Meds:.acetaminophen (TYLENOL) oral liquid 160 mg/5 mL, guaiFENesin, ipratropium-albuterol, labetalol, ondansetron (ZOFRAN) IV Medications Prior to Admission:  Prior to Admission medications   Medication Sig Start Date End Date Taking? Authorizing Provider  acetaminophen (ACETAMINOPHEN EXTRA STRENGTH) 500 MG tablet Place 500 mg into feeding tube every 12 (twelve) hours.   Yes [provider]  acetaminophen (TYLENOL) 325 MG tablet Place 650 mg into feeding tube every 4 (four) hours as needed for mild pain (general discomfort).    Yes [provider]  amLODipine  (NORVASC) 10 MG tablet Place 1 tablet (10 mg total) into feeding tube daily. 01/31/20  Yes Shelly Coss, MD  aspirin 81 MG chewable tablet 1 tablet (81 mg total) by Per J Tube route daily. 01/10/20  Yes Angiulli, Lavon Paganini, PA-C  atorvastatin (LIPITOR) 80 MG tablet Place 1 tablet (80 mg total) into feeding tube every morning. Patient taking differently: Place 80 mg into feeding tube at bedtime.  01/10/20  Yes Angiulli, Lavon Paganini, PA-C  brimonidine (ALPHAGAN) 0.2 % ophthalmic solution Place 1 drop into both eyes 2 (two) times daily. 01/10/20  Yes Angiulli, Lavon Paganini, PA-C  carvedilol (COREG) 3.125 MG tablet Place 1 tablet (3.125 mg total) into feeding tube 2 (two) times daily. 01/10/20  Yes Angiulli, Lavon Paganini, PA-C  chlorhexidine (PERIDEX) 0.12 % solution Use as directed 15 mLs in the mouth or throat 4 (four) times daily.   Yes [provider]  clopidogrel (PLAVIX) 75 MG tablet Place 1 tablet (75 mg total) into feeding tube daily. 01/10/20  Yes Angiulli, Lavon Paganini, PA-C  donepezil (ARICEPT) 10 MG tablet Place 1 tablet (10 mg total) into feeding tube at bedtime. Patient taking differently: Place 10 mg into feeding tube daily.  01/10/20  Yes Angiulli, Lavon Paganini, PA-C  dorzolamide (TRUSOPT) 2 % ophthalmic solution Place 1 drop into both eyes 3 (three) times daily. 01/10/20  Yes Angiulli, Lavon Paganini, PA-C  ferrous sulfate 220 (44 Fe) MG/5ML solution Place 308 mg into feeding tube daily.    Yes [provider]  Infant Care Products (DERMACLOUD) CREA Apply 1 application topically in the morning and at bedtime.   Yes [provider]  insulin glargine (LANTUS) 100 UNIT/ML injection Inject 0.25 mLs (25 Units total) into the skin 2 (two) times daily. 01/31/20  Yes Adhikari, Tamsen Meek, MD  latanoprost (XALATAN) 0.005 % ophthalmic solution Place 1 drop into both eyes at bedtime. 01/10/20  Yes Angiulli, Lavon Paganini, PA-C  Menthol, Topical Analgesic, (BIOFREEZE) 4 % GEL Apply 1 application  topically every 12  (twelve) hours.   Yes [provider]  Nutritional Supplements (FEEDING SUPPLEMENT, GLUCERNA 1.5 CAL,) LIQD Place 237 mLs into feeding tube in the morning, at noon, in the evening, and at bedtime.   Yes [provider]  pantoprazole sodium (PROTONIX) 40 mg/20 mL PACK Place 20 mLs (40 mg total) into feeding tube daily before breakfast. 01/10/20  Yes Angiulli, Lavon Paganini, PA-C  polyethylene glycol (MIRALAX / GLYCOLAX) 17 g packet Place 17 g into feeding tube 2 (two) times daily. 01/10/20  Yes Angiulli, Lavon Paganini, PA-C  scopolamine (TRANSDERM-SCOP) 1 MG/3DAYS Place 1 patch (1.5 mg total) onto the skin every 3 (three) days. 01/12/20  Yes Angiulli, Lavon Paganini, PA-C  senna-docusate (SENOKOT-S) 8.6-50 MG tablet Place 1 tablet into feeding tube 2 (two) times daily. 01/10/20  Yes Angiulli, Lavon Paganini, PA-C  sertraline (ZOLOFT) 25 MG tablet Place 25 mg into feeding tube daily.   Yes [provider]  traMADol (ULTRAM) 50 MG tablet Place 1 tablet (50 mg total) into feeding tube every 12 (twelve) hours as needed for severe pain. 01/31/20  Yes Shelly Coss, MD  Water For Irrigation, Sterile (FREE WATER) SOLN Place 200 mLs into feeding tube 3 (three) times daily. Patient taking differently: Place 200 mLs into feeding tube in the morning, at noon, in the evening, and at bedtime.  01/10/20  Yes Angiulli, Lavon Paganini, PA-C  Nutritional Supplements (FEEDING SUPPLEMENT, JEVITY 1.2 CAL,) LIQD Place 1,000 mLs into feeding tube continuous. Continue at slow rate: 40 ml/hr Patient not taking: Reported on 02/27/2020 01/31/20   Shelly Coss, MD   No Known Allergies Review of Systems  Unable to perform ROS: Patient nonverbal    Physical Exam Constitutional:      Appearance: She is underweight. She is ill-appearing.     Interventions: Nasal cannula in place.  Cardiovascular:     Rate and Rhythm: Normal rate.  Pulmonary:     Breath sounds: Decreased breath sounds present.  Skin:    General: Skin is warm  and dry.  Neurological:     Mental Status: She is lethargic.     Vital Signs: BP (!) 120/55   Pulse 70   Temp 98.2 F (36.8 C) (Axillary)   Resp 18   Ht 5' 8"  (1.727 m)   Wt 71.9 kg   SpO2 98%   BMI 24.10 kg/m  Pain Scale: 0-10   Pain Score: Asleep   SpO2: SpO2: 98 % O2 Device:SpO2: 98 % O2 Flow Rate: .O2 Flow Rate (L/min): 2 L/min  IO: Intake/output summary:   Intake/Output Summary (Last 24 hours) at 02/29/2020 1047 Last data filed at 02/29/2020 0349 Gross per 24 hour  Intake 2424.02 ml  Output --  Net 2424.02 ml    LBM: Last BM Date: 02/29/20 Baseline Weight: Weight: 74.9 kg Most recent weight: Weight: 71.9 kg     Palliative Assessment/Data: 30 % at best   Discussed with Dr Bonner Puna vis secure chat  Time In: 1300 Time Out: 1415 Time Total: 75 minutes Greater than 50%  of this time was spent counseling and coordinating care related to the above assessment and plan.  Signed by: Wadie Lessen, NP   Please contact Palliative Medicine Team phone at [phone] for questions and concerns.  For individual provider: See Shea Evans

## 2020-02-29 NOTE — Plan of Care (Signed)

## 2020-03-01 DIAGNOSIS — Z66 Do not resuscitate: Secondary | ICD-10-CM

## 2020-03-01 DIAGNOSIS — R627 Adult failure to thrive: Secondary | ICD-10-CM

## 2020-03-01 LAB — GLUCOSE, CAPILLARY
Glucose-Capillary: 228 mg/dL — ABNORMAL HIGH (ref 70–99)
Glucose-Capillary: 231 mg/dL — ABNORMAL HIGH (ref 70–99)
Glucose-Capillary: 233 mg/dL — ABNORMAL HIGH (ref 70–99)
Glucose-Capillary: 233 mg/dL — ABNORMAL HIGH (ref 70–99)
Glucose-Capillary: 239 mg/dL — ABNORMAL HIGH (ref 70–99)
Glucose-Capillary: 241 mg/dL — ABNORMAL HIGH (ref 70–99)
Glucose-Capillary: 248 mg/dL — ABNORMAL HIGH (ref 70–99)
Glucose-Capillary: 258 mg/dL — ABNORMAL HIGH (ref 70–99)

## 2020-03-01 LAB — CBC
HCT: 30.6 % — ABNORMAL LOW (ref 36.0–46.0)
Hemoglobin: 9.1 g/dL — ABNORMAL LOW (ref 12.0–15.0)
MCH: 27.5 pg (ref 26.0–34.0)
MCHC: 29.7 g/dL — ABNORMAL LOW (ref 30.0–36.0)
MCV: 92.4 fL (ref 80.0–100.0)
Platelets: 293 10*3/uL (ref 150–400)
RBC: 3.31 MIL/uL — ABNORMAL LOW (ref 3.87–5.11)
RDW: 13.8 % (ref 11.5–15.5)
WBC: 17.7 10*3/uL — ABNORMAL HIGH (ref 4.0–10.5)
nRBC: 0 % (ref 0.0–0.2)

## 2020-03-01 LAB — BASIC METABOLIC PANEL
Anion gap: 6 (ref 5–15)
BUN: 29 mg/dL — ABNORMAL HIGH (ref 8–23)
CO2: 23 mmol/L (ref 22–32)
Calcium: 9.8 mg/dL (ref 8.9–10.3)
Chloride: 108 mmol/L (ref 98–111)
Creatinine, Ser: 1.11 mg/dL — ABNORMAL HIGH (ref 0.44–1.00)
GFR calc Af Amer: 51 mL/min — ABNORMAL LOW (ref >60)
GFR calc non Af Amer: 44 mL/min — ABNORMAL LOW (ref >60)
Glucose, Bld: 264 mg/dL — ABNORMAL HIGH (ref 70–99)
Potassium: 3.4 mmol/L — ABNORMAL LOW (ref 3.5–5.1)
Sodium: 137 mmol/L (ref 135–145)

## 2020-03-01 LAB — MAGNESIUM: Magnesium: 1.7 mg/dL (ref 1.7–2.4)

## 2020-03-01 MED ORDER — PRO-STAT SUGAR FREE PO LIQD
30.0000 mL | Freq: Every day | ORAL | Status: DC
  Administered 2020-03-01 – 2020-03-06 (×6): 30 mL
  Filled 2020-03-01 (×6): qty 30

## 2020-03-01 MED ORDER — JEVITY 1.2 CAL PO LIQD
1000.0000 mL | ORAL | Status: DC
  Administered 2020-03-01: 20 mL
  Administered 2020-03-02 – 2020-03-04 (×3): 1000 mL
  Filled 2020-03-01 (×9): qty 1000

## 2020-03-01 NOTE — Progress Notes (Signed)
Initial Nutrition Assessment  RD working remotely.  DOCUMENTATION CODES:   Not applicable  INTERVENTION:   Initiate continuous tube feeds via PEG: - Start Jevity 1.2 @ 20 ml/hr and increase by 10 ml q 8 hours until goal rate of 60 ml/hr (1440 ml/day) - Pro-stat 30 ml daily - Free water per MD, currently 300 ml q 6 hours  Tube feeding regimen at goal and current free water flushes provide 1828 kcal, 91 grams of protein, and 2362 ml of H2O. MD to adjust free water flushes as needed.  NUTRITION DIAGNOSIS:   Inadequate oral intake related to dysphagia as evidenced by NPO status.  GOAL:   Patient will meet greater than or equal to 90% of their needs  MONITOR:   Labs, Weight trends, TF tolerance, Skin, I & O's  REASON FOR ASSESSMENT:   Consult Enteral/tube feeding initiation and management  ASSESSMENT:   86 year old female who presented on 5/10 with SOB and vomiting. PMH of stroke, T2DM, CHF, HTN, HLD, dysphagia s/p PEG, complete heart block with PPM, CKD stage II, CAD s/p CABG. Pt admitted with sepsis, probable aspiration pneumonia.   Given pt was admitted with probable aspiration pneumonia, RD will order continuous tube feeds starting at a low rate and titrate slowly to goal rate.  Reviewed weight history in chart. Pt with an 11 kg weight loss since 01/10/20. This is a 13.5% weight loss in less than 2 months which is significant for timeframe. If accurate, pt is at risk for malnutrition. RD unable to confirm at this time without NFPE.  Medications reviewed and include: ferrous sulfate, SSI q 4 hours, Lantus 14 units daily, Bacid, protonix, miralax, senna, IV abx IVF: D5 with 1/2NS and KCl @ 100 ml/hr  Labs reviewed: potassium 3.4, BUN 29, creatinine 1.11, hemoglobin 9.1 CBG's: 173-258 x 24 hours  UOP: 600 ml x 24 hours  NUTRITION - FOCUSED PHYSICAL EXAM:  Unable to complete at this time. RD working remotely.  Diet Order:   Diet Order            Diet NPO time  specified  Diet effective now              EDUCATION NEEDS:   No education needs have been identified at this time  Skin:  Skin Assessment: Skin Integrity Issues: Stage II: sacrum Unstageable: left heel  Last BM:  03/01/20 large type 6  Height:   Ht Readings from Last 1 Encounters:  02/27/20 5' 8"  (1.727 m)    Weight:   Wt Readings from Last 1 Encounters:  02/27/20 71.9 kg    Ideal Body Weight:  63.6 kg  BMI:  Body mass index is 24.1 kg/m.  Estimated Nutritional Needs:   Kcal:  1650-1850  Protein:  80-95 grams  Fluid:  1.7-1.9 L    Gaynell Face, MS, RD, LDN Inpatient Clinical Dietitian Pager: [phone] Weekend/After Hours: [phone]

## 2020-03-01 NOTE — Plan of Care (Signed)
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
## 2020-03-01 NOTE — Progress Notes (Signed)
Patient ID: Renee Rival, female   DOB: [birth year], 86 y.o.   MRN: [id number]  This NP visited patient at the bedside as a follow up to  yesterday's [place] for palliative medicine needs and emotional support and to meet with family as scheduled for continued conversation regarding current medical situation; diagnosis, prognosis, goals of care, treatment option decisions, advanced directive decisions and anticipatory care needs.  Patient is more alert today, she is nonverbal and appears to be generally uncomfortable with any kind of repositioning. Today I met with patient's 2 daughters, son and granddaughter.  Education offered on concept of adult failure to thrive and its natural trajectory over time.   Education offered on the natural trajectory and expectations at end of life.  Education offered on the benefits and risks of artificial feeding/PEG tube.  Education offered on the increased risk of aspiration and upper respiratory infections in a patient who is frail and bedbound.  Education offered on her high risk for decompensation  Family all verbalized an understanding of the patient's current serious situation and all family verbalized their hope for comfort and dignity for Ms. Stroh at this time in her life.  Plan of care: -DNR/DNI -Continue artificial feeding via PEG -Treat the treatable while here in the hospital and when stabilized -Transition patient home with hospice services  MOST completed, hard copy placed in chart   Discussed with family the importance of continued conversation with each other and the  medical providers regarding overall plan of care and treatment options,  ensuring decisions are within the context of the patients values and GOCs.  Questions and concerns addressed   Discussed with Dr Doneen Poisson discharge early next week  Total time spent on the unit was 60 minutes  Greater than 50% of the time was spent in counseling and coordination of  care  This nurse practitioner informed  the patient/family and the attending that I will be out of the hospital until Monday morning.  If the patient is still hospitalized I will follow-up at that time.  Call palliative medicine team phone # [phone] with questions or concerns in the interim   Wadie Lessen NP  Palliative Medicine Team Team Phone # [phone] Pager [phone]

## 2020-03-01 NOTE — Progress Notes (Signed)
Pharmacy Antibiotic Note  [name] is a 86 y.o. female admitted on 02/27/2020 with pneumonia and sepsis. Pharmacy has been consulted for vancomycin and cefepime dosing. Pt has a low grade fever of 100.5 and WBC is elevated at 12.8. SCr is slightly above patients baseline. Lactic acid is <2.   Pt remains on ABX D#4.  Plan: Vancomycin 1551m IV x 1 then 1g IV Q24H Cefepime 2gm IV Q12H F/u renal fxn, C&S, clinical status and peak/trough at SS  Height: 5' 8"  (172.7 cm) Weight: 71.9 kg (158 lb 8.2 oz) IBW/kg (Calculated) : 63.9  Temp (24hrs), Avg:98.3 F (36.8 C), Min:98.1 F (36.7 C), Max:98.6 F (37 C)  Recent Labs  Lab 02/27/20 0830 02/27/20 1022 02/28/20 0238 02/29/20 1145 03/01/20 0237  WBC 12.8*  --  10.3 17.4* 17.7*  CREATININE 1.03*  --  1.20* 1.24* 1.11*  LATICACIDVEN 1.7 1.4  --   --   --     Estimated Creatinine Clearance: 34.7 mL/min (A) (by C-G formula based on SCr of 1.11 mg/dL (H)).    No Known Allergies  Antimicrobials this admission: Vanc 5/10>> Cefepime 5/10>> Azithro x 1 5/10  Dose adjustments this admission: N/A  Microbiology results: Pending*  Thank you for allowing pharmacy to be a part of this patient's care.  MArrie Senate PharmD, BCPS Clinical Pharmacist [phone] check AMION for all MCalciumnumbers 03/01/2020

## 2020-03-01 NOTE — Care Management Important Message (Signed)
Important Message  Patient Details  Name: [name] MRN: [id number] Date of Birth: [birth year]   Medicare Important Message Given:  Yes     Sebrena Engh Montine Circle 03/01/2020, 12:45 PM

## 2020-03-01 NOTE — TOC Progression Note (Signed)
Transition of Care [facility]) - Progression Note    Patient Details  Name: [name] MRN: [id number] Date of Birth: April 01, 1928  [facility] CM/SW Contact  [name], RN Phone Number: 03/01/2020, 3:21 PM  Clinical Narrative:    Palliative met with patient and family today, per note looks like patient is not ready for Hospice just yet, she is getting iv abx and starting tube feeds,  but when the time comes the daughter, [name] would like [facility].  Palliative will let the [facility] team know when patient is ready for Home Hospice. [facility] will keep on their radar.        Expected Discharge Plan and Services                                                 Social Determinants of Health (SDOH) Interventions    Readmission Risk Interventions No flowsheet data found.

## 2020-03-01 NOTE — Plan of Care (Signed)
  Problem: Clinical Measurements: Goal: Ability to maintain clinical measurements within normal limits will improve Outcome: Progressing Goal: Will remain free from infection Outcome: Progressing Goal: Diagnostic test results will improve Outcome: Progressing Goal: Respiratory complications will improve Outcome: Progressing Goal: Cardiovascular complication will be avoided Outcome: Progressing   Problem: Education: Goal: Knowledge of General Education information will improve Description: Including pain rating scale, medication(s)/side effects and non-pharmacologic comfort measures Outcome: Progressing   Problem: Activity: Goal: Risk for activity intolerance will decrease Outcome: Progressing   Problem: Nutrition: Goal: Adequate nutrition will be maintained Outcome: Progressing   Problem: Coping: Goal: Level of anxiety will decrease Outcome: Progressing   Problem: Elimination: Goal: Will not experience complications related to bowel motility Outcome: Progressing Goal: Will not experience complications related to urinary retention Outcome: Progressing   Problem: Pain Managment: Goal: General experience of comfort will improve Outcome: Progressing   Problem: Safety: Goal: Ability to remain free from injury will improve Outcome: Progressing   Problem: Skin Integrity: Goal: Risk for impaired skin integrity will decrease Outcome: Progressing

## 2020-03-01 NOTE — Progress Notes (Signed)
Patient ID: [name], female   DOB: Jan 15, 1928, 86 y.o.   MRN: [id number]  PROGRESS NOTE    [name]  QHU:[id number] DOB: 1928/05/15 DOA: 02/27/2020 PCP: [name], MD   Brief Narrative:  86 year old female with history of unspecified CVA with residual right-sided weakness and dysphagia requiring PEG tube feeding, CAD status post CABG, complete heart block status post pacemaker, diabetes mellitus type 2, sleep apnea, aspiration pneumonia requiring admission and discharge from 01/27/2020-02/03/2020 presented on 02/27/2020 from SNF for shortness of breath after few episodes of vomiting the night prior to presentation.  EMS found her saturation in the 60s and patient was placed on nonrebreather.  In the ED, chest x-ray showed bilateral lower lobe infiltrates.  CT facial and neck showed right parotid swelling compatible with acute parotitis.  Sodium was 152.  She was started on IV fluids and antibiotics.  Assessment & Plan: Sepsis due to probable aspiration pneumonia as well as bacterial acute parotitis:  - Continue broad-spectrum antibiotics including MRSA coverage for parotitis  - Blood cultures NGTD  Acute hypoxic respiratory failure: Improving. - Continue supplemental oxygen to maintain SpO2 >90% and normal respiratory effort. Currently 2L O2.  Acute metabolic encephalopathy on chronic dementia: Suspected to be due to severity of illness on baseline cerebrovascular disease. Has baseline right-sided weakness due to prior CVA. - Delirium precautions.  - Continue aricept  Leukocytosis: Worsened though fever curve has improved. Possibly related to aspiration. Less likely yeast in urine culture, possibly from sacral wound, though no purulence noted. - Continue monitoring.   Yeast infection: Possibly infection of urinary tract vs. cross contamination from vulvovaginal candidiasis.  - Continue diflucan.  Hypernatremia: Improved when free water restarted.   Hypercalcemia, severe  dehydration - Improved with IVF, will stop now that restarting tube feeds  Hypokalemia:  - Supplemented and improved, monitor  AKI on stage IIIa CKD: Presumed based on prior Cr values.  - Continue monitoring, improved.  Dysphagia - Restart TFs at low rate depending on clinical progress over next 24 hours. - Continue free water flushes if patient is not vomiting  Hypertension - Continue amlodipine, Coreg  History of unspecified stroke with residual right-sided weakness and dysphagia - Continue DAPT and statin  Diabetes mellitus type 2 with hyperglycemia: Recent HbA1c 8.6%. - Continue basal-bolus insulin. - Continue CBGs with SSI.  CAD s/p CABG, CHB s/p PPM:  - Continue aspirin, plavix, statin, coreg   Sleep apnea - Continue CPAP at night  Stage 3 sacral pressure injury including gluteal fold, resolving DTI to left heel, POA:  - WOC consulted, local wound care and offloading.   Generalized deconditioning - Overall prognosis is poor. Palliative care consulted and guiding family discussions. Unclear whether patient would be able to rehabilitate at SNF. Would be appropriate for home hospice.   DVT prophylaxis: Lovenox Code Status:  DNR per palliative care discussions Family Communication: Family meeting with palliative care today. Disposition Plan: Status is: Inpatient  Remains inpatient appropriate because:Hemodynamically unstable. Critically ill still requiring 6 L oxygen by nasal cannula with altered mental status, dehydration and still requiring IV antibiotics.   Dispo: The patient is from: SNF              Anticipated d/c is to: TBD. SNF vs. home hospice              Anticipated d/c date is: > 3 days              Patient currently is not medically  stable to d/c.    Consultants: Palliative care medicine team  Procedures: None  Antimicrobials: Cefepime, vancomycin, flagyl 02/27/2020 >>  Subjective: No changes or complaints. Has not had fever in past 48  hrs.  Objective: Vitals:   03/01/20 0832 03/01/20 0955 03/01/20 1200 03/01/20 1344  BP:  129/61 117/60 117/60  Pulse: 78 70 79 75  Resp: 18 19 (!) 21 (!) 23  Temp:   98.1 F (36.7 C)   TempSrc:   Axillary   SpO2: 100%   100%  Weight:      Height:        Intake/Output Summary (Last 24 hours) at 03/01/2020 1356 Last data filed at 03/01/2020 1210 Gross per 24 hour  Intake 2940.97 ml  Output 1250 ml  Net 1690.97 ml   Filed Weights   02/27/20 0832 02/27/20 2300  Weight: 74.9 kg 71.9 kg   Examination: Gen: Elderly, frail female in no distress Pulm: Nonlabored breathing 2L O2, tachypneic, coarse. CV: Regular rate and rhythm. No murmur, rub, or gallop. No JVD, no dependent edema. GI: Abdomen soft, non-tender, non-distended, with normoactive bowel sounds.  Ext: Warm, no deformities Skin: No new rashes, lesions or ulcers on visualized skin. PEG site appears normal. Neuro: Minimally responsive. Not cooperative with full exam. Psych: Judgement and insight appear impaired. Calm.    Data Reviewed: I have personally reviewed following labs and imaging studies  CBC: Recent Labs  Lab 02/27/20 0830 02/28/20 0238 02/29/20 1145 03/01/20 0237  WBC 12.8* 10.3 17.4* 17.7*  NEUTROABS 10.2*  --   --   --   HGB 11.9* 11.0* 9.0* 9.1*  HCT 40.3 38.6 30.5* 30.6*  MCV 95.3 95.5 92.7 92.4  PLT 369 323 288 062   Basic Metabolic Panel: Recent Labs  Lab 02/27/20 0830 02/28/20 0238 02/29/20 1145 03/01/20 0237  NA 152* 152* 144 137  K 3.5 3.4* 3.2* 3.4*  CL 114* 115* 112* 108  CO2 26 27 25 23   GLUCOSE 239* 251* 153* 264*  BUN 23 30* 36* 29*  CREATININE 1.03* 1.20* 1.24* 1.11*  CALCIUM 11.9* 11.5* 10.6* 9.8  MG  --   --   --  1.7   Liver Function Tests: Recent Labs  Lab 02/27/20 0830  AST 24  ALT 46*  ALKPHOS 100  BILITOT 0.6  PROT 7.5  ALBUMIN 2.9*   Coagulation Profile: Recent Labs  Lab 02/27/20 1022  INR 1.1   CBG: Recent Labs  Lab 02/29/20 2324 03/01/20 0006  03/01/20 0328 03/01/20 0813 03/01/20 1145  GLUCAP 226* 233* 228* 258* 241*   Sepsis Labs: Recent Labs  Lab 02/27/20 0830 02/27/20 1022  LATICACIDVEN 1.7 1.4    Recent Results (from the past 240 hour(s))  SARS Coronavirus 2 by RT PCR (hospital order, performed in [facility] hospital lab) Nasopharyngeal Nasopharyngeal Swab     Status: None   Collection Time: 02/27/20  9:50 AM   Specimen: Nasopharyngeal Swab  Result Value Ref Range Status   SARS Coronavirus 2 NEGATIVE NEGATIVE Final    Comment: (NOTE) SARS-CoV-2 target nucleic acids are NOT DETECTED. The SARS-CoV-2 RNA is generally detectable in upper and lower respiratory specimens during the acute phase of infection. The lowest concentration of SARS-CoV-2 viral copies this assay can detect is 250 copies / mL. A negative result does not preclude SARS-CoV-2 infection and should not be used as the sole basis for treatment or other patient management decisions.  A negative result may occur with improper specimen collection / handling, submission  of specimen other than nasopharyngeal swab, presence of viral mutation(s) within the areas targeted by this assay, and inadequate number of viral copies (<250 copies / mL). A negative result must be combined with clinical observations, patient history, and epidemiological information. Fact Sheet for Patients:   StrictlyIdeas.no Fact Sheet for Healthcare Providers: BankingDealers.co.za This test is not yet approved or cleared  by the [place] FDA and has been authorized for detection and/or diagnosis of SARS-CoV-2 by FDA under an Emergency Use Authorization (EUA).  This EUA will remain in effect (meaning this test can be used) for the duration of the COVID-19 declaration under Section 564(b)(1) of the Act, 21 U.S.C. section 360bbb-3(b)(1), unless the authorization is terminated or revoked sooner. Performed at [facility],  [place] [address] [place], [place] [address]   Culture, blood (routine x 2)     Status: None (Preliminary result)   Collection Time: 02/27/20  9:59 AM   Specimen: BLOOD RIGHT WRIST  Result Value Ref Range Status   Specimen Description BLOOD RIGHT WRIST  Final   Special Requests   Final    BOTTLES DRAWN AEROBIC AND ANAEROBIC Blood Culture results may not be optimal due to an inadequate volume of blood received in culture bottles   Culture   Final    NO GROWTH 3 DAYS Performed at [facility], [place] [address], [place], [place] [address]    Report Status PENDING  Incomplete  Culture, blood (routine x 2)     Status: None (Preliminary result)   Collection Time: 02/27/20 10:22 AM   Specimen: BLOOD  Result Value Ref Range Status   Specimen Description BLOOD SITE NOT SPECIFIED  Final   Special Requests   Final    BOTTLES DRAWN AEROBIC AND ANAEROBIC Blood Culture results may not be optimal due to an excessive volume of blood received in culture bottles   Culture   Final    NO GROWTH 3 DAYS Performed at [facility], [place] [address], [place], [place] [address]    Report Status PENDING  Incomplete  MRSA PCR Screening     Status: None   Collection Time: 02/27/20 11:16 PM   Specimen: Nasal Mucosa; Nasopharyngeal  Result Value Ref Range Status   MRSA by PCR NEGATIVE NEGATIVE Final    Comment:        The GeneXpert MRSA Assay (FDA approved for NASAL specimens only), is one component of a comprehensive MRSA colonization surveillance program. It is not intended to diagnose MRSA infection nor to guide or monitor treatment for MRSA infections. Performed at [facility], [place] [address], [place], [place] [address]   Urine culture     Status: Abnormal   Collection Time: 02/28/20  7:10 AM   Specimen: In/Out Cath Urine  Result Value Ref Range Status   Specimen Description IN/OUT CATH URINE  Final   Special Requests   Final    NONE Performed at [facility], [place] [address] [place], [place] [address]    Culture >=100,000 COLONIES/mL YEAST (A)  Final   Report Status 02/29/2020 FINAL  Final    Radiology Studies: No results found. Scheduled Meds: . acetaminophen (TYLENOL) oral liquid 160 mg/5 mL  500 mg Per Tube Q12H  . amLODipine  10 mg Per Tube Daily  . aspirin  81 mg Per J Tube Daily  . atorvastatin  80 mg Per Tube QHS  . brimonidine  1 drop Both Eyes BID  .  carvedilol  3.125 mg Per Tube BID  . chlorhexidine  15 mL Mouth/Throat QID  . clopidogrel  75 mg Per Tube Daily  . donepezil  10 mg Per Tube Daily  . dorzolamide  1 drop Both Eyes TID  . enoxaparin (LOVENOX) injection  40 mg Subcutaneous Q24H  . feeding supplement (PRO-STAT SUGAR FREE 64)  30 mL Per Tube Daily  . ferrous sulfate  300 mg Per Tube Q breakfast  . fluconazole  100 mg Per Tube Daily  . free water  300 mL Per Tube Q6H  . insulin aspart  0-9 Units Subcutaneous Q4H  . insulin glargine  14 Units Subcutaneous Daily  . ipratropium-albuterol  3 mL Nebulization TID  . lactobacillus acidophilus  2 tablet Oral TID  . latanoprost  1 drop Both Eyes QHS  . Muscle Rub  1 application Topical Z02H  . pantoprazole sodium  40 mg Per Tube QAC breakfast  . polyethylene glycol  17 g Per Tube BID  . senna-docusate  1 tablet Per Tube BID  . sertraline  25 mg Per Tube Daily  . sodium chloride flush  3 mL Intravenous Once   Continuous Infusions: . ceFEPime (MAXIPIME) IV 2 g (03/01/20 1001)  . dextrose 5 % and 0.45 % NaCl with KCl 20 mEq/L 100 mL/hr at 03/01/20 0309  . feeding supplement (JEVITY 1.2 CAL)    . metronidazole 500 mg (03/01/20 0603)  . vancomycin 1,000 mg (03/01/20 1145)   Time spent: 25 minutes  Patrecia Pour, MD [facility] 03/01/2020, 1:56 PM

## 2020-03-01 NOTE — Progress Notes (Signed)
This chaplain responded to PMT consult for Pt. spiritual care from family.  The chaplain learned from the RN-Suzanne the Pt. family just left.  The chaplain prayed with the Pt. bedside. F/U spiritual care is available with Pt. and family as needed.

## 2020-03-02 ENCOUNTER — Other Ambulatory Visit: Payer: Self-pay

## 2020-03-02 DIAGNOSIS — Z515 Encounter for palliative care: Secondary | ICD-10-CM

## 2020-03-02 DIAGNOSIS — R531 Weakness: Secondary | ICD-10-CM

## 2020-03-02 LAB — COMPREHENSIVE METABOLIC PANEL
ALT: 23 U/L (ref 0–44)
AST: 19 U/L (ref 15–41)
Albumin: 1.9 g/dL — ABNORMAL LOW (ref 3.5–5.0)
Alkaline Phosphatase: 83 U/L (ref 38–126)
Anion gap: 10 (ref 5–15)
BUN: 18 mg/dL (ref 8–23)
CO2: 22 mmol/L (ref 22–32)
Calcium: 9.5 mg/dL (ref 8.9–10.3)
Chloride: 108 mmol/L (ref 98–111)
Creatinine, Ser: 0.91 mg/dL (ref 0.44–1.00)
GFR calc Af Amer: >60 mL/min (ref >60)
GFR calc non Af Amer: 56 mL/min — ABNORMAL LOW (ref >60)
Glucose, Bld: 254 mg/dL — ABNORMAL HIGH (ref 70–99)
Potassium: 3.2 mmol/L — ABNORMAL LOW (ref 3.5–5.1)
Sodium: 140 mmol/L (ref 135–145)
Total Bilirubin: 0.5 mg/dL (ref 0.3–1.2)
Total Protein: 5.8 g/dL — ABNORMAL LOW (ref 6.5–8.1)

## 2020-03-02 LAB — CBC
HCT: 31.7 % — ABNORMAL LOW (ref 36.0–46.0)
Hemoglobin: 9.6 g/dL — ABNORMAL LOW (ref 12.0–15.0)
MCH: 27.6 pg (ref 26.0–34.0)
MCHC: 30.3 g/dL (ref 30.0–36.0)
MCV: 91.1 fL (ref 80.0–100.0)
Platelets: 293 10*3/uL (ref 150–400)
RBC: 3.48 MIL/uL — ABNORMAL LOW (ref 3.87–5.11)
RDW: 13.7 % (ref 11.5–15.5)
WBC: 15.5 10*3/uL — ABNORMAL HIGH (ref 4.0–10.5)
nRBC: 0 % (ref 0.0–0.2)

## 2020-03-02 LAB — GLUCOSE, CAPILLARY
Glucose-Capillary: 236 mg/dL — ABNORMAL HIGH (ref 70–99)
Glucose-Capillary: 235 mg/dL — ABNORMAL HIGH (ref 70–99)
Glucose-Capillary: 240 mg/dL — ABNORMAL HIGH (ref 70–99)
Glucose-Capillary: 341 mg/dL — ABNORMAL HIGH (ref 70–99)
Glucose-Capillary: 339 mg/dL — ABNORMAL HIGH (ref 70–99)
Glucose-Capillary: 380 mg/dL — ABNORMAL HIGH (ref 70–99)

## 2020-03-02 LAB — PHOSPHORUS: Phosphorus: 1.4 mg/dL — ABNORMAL LOW (ref 2.5–4.6)

## 2020-03-02 MED ORDER — FLUCONAZOLE 100 MG PO TABS
100.0000 mg | ORAL_TABLET | Freq: Every day | ORAL | Status: DC
  Administered 2020-03-02 – 2020-03-06 (×5): 100 mg
  Filled 2020-03-02 (×5): qty 1

## 2020-03-02 MED ORDER — K PHOS MONO-SOD PHOS DI & MONO 155-852-130 MG PO TABS
500.0000 mg | ORAL_TABLET | Freq: Three times a day (TID) | ORAL | Status: DC
  Administered 2020-03-02 – 2020-03-06 (×13): 500 mg
  Filled 2020-03-02 (×16): qty 2

## 2020-03-02 MED ORDER — INSULIN GLARGINE 100 UNIT/ML ~~LOC~~ SOLN
25.0000 [IU] | Freq: Every day | SUBCUTANEOUS | Status: DC
  Administered 2020-03-02: 25 [IU] via SUBCUTANEOUS
  Filled 2020-03-02 (×2): qty 0.25

## 2020-03-02 NOTE — Plan of Care (Signed)
  Problem: Nutrition: Goal: Adequate nutrition will be maintained Outcome: Progressing   Problem: Skin Integrity: Goal: Risk for impaired skin integrity will decrease Outcome: Progressing   Problem: Pain Managment: Goal: General experience of comfort will improve Outcome: Progressing   Problem: Clinical Measurements: Goal: Ability to maintain clinical measurements within normal limits will improve Outcome: Progressing

## 2020-03-02 NOTE — Progress Notes (Signed)
Patient ID: [name], female   DOB: [birth year], 86 y.o.   MRN: [id number]  PROGRESS NOTE    PETULA ROTOLO  WUJ:[id number] DOB: [birth year] DOA: 02/27/2020 PCP: [name], MD   Brief Narrative:  86 year old female with history of unspecified CVA with residual right-sided weakness and dysphagia requiring PEG tube feeding, CAD status post CABG, complete heart block status post pacemaker, diabetes mellitus type 2, sleep apnea, aspiration pneumonia requiring admission and discharge from 01/27/2020-02/03/2020 presented on 02/27/2020 from SNF for shortness of breath after few episodes of vomiting the night prior to presentation.  EMS found her saturation in the 60s and patient was placed on nonrebreather.  In the ED, chest x-ray showed bilateral lower lobe infiltrates.  CT facial and neck showed right parotid swelling compatible with acute parotitis.  Sodium was 152.  She was started on IV fluids and antibiotics.  Assessment & Plan: Sepsis due to probable aspiration pneumonia as well as bacterial acute parotitis:  - Continue broad-spectrum antibiotics including MRSA coverage for parotitis  - Blood cultures NGTD  Acute hypoxic respiratory failure:  - Continue supplemental oxygen to maintain SpO2 >90% and normal respiratory effort. Currently improved to 2L, attempt to wean as we continue abx.  Acute metabolic encephalopathy on chronic dementia: Suspected to be due to severity of illness on baseline cerebrovascular disease. Has baseline right-sided weakness due to prior CVA. - Delirium precautions.  - Continue aricept - Not showing significant improvement, suspect this coincides with worsening prognosis/end of life.  Leukocytosis: Worsened though fever curve has improved. Possibly related to aspiration. Less likely yeast in urine culture, possibly from sacral wound, though no purulence noted. - Continue monitoring. Improved.   Yeast infection: Possibly infection of urinary tract vs. cross  contamination from vulvovaginal candidiasis.  - Continue diflucan.  Hypernatremia: Improved when free water restarted.   Hypercalcemia, severe dehydration - Improved with IVF, will stop now that restarting tube feeds  AKI on stage IIIa CKD: Presumed based on prior Cr values.  - Continue monitoring, improved.  Dysphagia - Restarted TFs 5/13, increasing rate to goal today. Tolerating thus far. - Continue free water flushes, decrease volume to minimize risk of aspiration.  Hypophosphatemia: Possibly refeeding syndrome.  - Supplement aggressively today, recheck daily x at least 3 days.  Hypokalemia: Supplement.  Hypertension - Continue amlodipine, Coreg  History of unspecified stroke with residual right-sided weakness and dysphagia - Continue DAPT and statin  Diabetes mellitus type 2 with hyperglycemia: Recent HbA1c 8.6%. - Continue basal-bolus insulin. Will increase basal to 25u today, ultimately may get back to 25u BID home dose when TFs at goal. - Continue CBGs with SSI.  CAD s/p CABG, CHB s/p PPM:  - Continue aspirin, plavix, statin, coreg   Sleep apnea - Continue CPAP at night  Stage 3 sacral pressure injury including gluteal fold, resolving DTI to left heel, POA:  - WOC consulted, local wound care and offloading.   Generalized deconditioning - Overall prognosis is poor. Palliative care consulted and guiding family discussions. Unclear whether patient would be able to rehabilitate at SNF. Would be appropriate for home hospice.   DVT prophylaxis: Lovenox Code Status:  DNR per palliative care discussions Family Communication: Family meeting with palliative care 5/13, will reach out to family again today. Disposition Plan: Status is: Inpatient  Remains inpatient appropriate because:Hemodynamically unstable. Critically ill still requiring supplemental oxygen by nasal cannula with altered mental status, dehydration and still requiring IV antibiotics.    Dispo: The  patient is from:  SNF              Anticipated d/c is to: Home with hospice following medical optimization. Possibly 5/17.              Anticipated d/c date is: > 3 days              Patient currently is not medically stable to d/c.    Consultants: Palliative care medicine team  Procedures: None  Antimicrobials: Cefepime, vancomycin, flagyl 02/27/2020 >>  Subjective: Not verbal, opens eyes to voice, touch, not following commands or responding meaningfully.  Objective: Vitals:   03/02/20 0351 03/02/20 0716 03/02/20 0727 03/02/20 0800  BP: (!) 144/61  (!) 148/55 (!) 133/54  Pulse: 78  71 70  Resp: 16  20 19   Temp:   99.6 F (37.6 C)   TempSrc:   Axillary   SpO2: 97% 98% 98%   Weight:      Height:        Intake/Output Summary (Last 24 hours) at 03/02/2020 0931 Last data filed at 03/02/2020 0900 Gross per 24 hour  Intake 2839 ml  Output 2450 ml  Net 389 ml   Filed Weights   02/27/20 0832 02/27/20 2300  Weight: 74.9 kg 71.9 kg   Examination: Gen: Elderly, increasingly frail-appearing female in no distress Pulm: Nonlabored with stable rate ~20/min, snoring occasionally while in room. Coarse. CV: Regular rate and rhythm. No murmur, rub, or gallop. No JVD. Right sided swelling stable GI: Abdomen soft, non-tender, non-distended, with normoactive bowel sounds.  Ext: Warm, no deformities Skin: No rashes, lesions or ulcers on visualized skin. PEG site c/d/i. Neuro: Rousable but minimally responsive, not moving extremities volitionally and not following commands. Psych: Judgement and insight appear impaired, UTD.   Data Reviewed: I have personally reviewed following labs and imaging studies  CBC: Recent Labs  Lab 02/27/20 0830 02/28/20 0238 02/29/20 1145 03/01/20 0237 03/02/20 0304  WBC 12.8* 10.3 17.4* 17.7* 15.5*  NEUTROABS 10.2*  --   --   --   --   HGB 11.9* 11.0* 9.0* 9.1* 9.6*  HCT 40.3 38.6 30.5* 30.6* 31.7*  MCV 95.3 95.5 92.7 92.4 91.1  PLT 369 323 288 293 295    Basic Metabolic Panel: Recent Labs  Lab 02/27/20 0830 02/28/20 0238 02/29/20 1145 03/01/20 0237 03/02/20 0304  NA 152* 152* 144 137 140  K 3.5 3.4* 3.2* 3.4* 3.2*  CL 114* 115* 112* 108 108  CO2 26 27 25 23 22   GLUCOSE 239* 251* 153* 264* 254*  BUN 23 30* 36* 29* 18  CREATININE 1.03* 1.20* 1.24* 1.11* 0.91  CALCIUM 11.9* 11.5* 10.6* 9.8 9.5  MG  --   --   --  1.7  --   PHOS  --   --   --   --  1.4*   Liver Function Tests: Recent Labs  Lab 02/27/20 0830 03/02/20 0304  AST 24 19  ALT 46* 23  ALKPHOS 100 83  BILITOT 0.6 0.5  PROT 7.5 5.8*  ALBUMIN 2.9* 1.9*   Coagulation Profile: Recent Labs  Lab 02/27/20 1022  INR 1.1   CBG: Recent Labs  Lab 03/01/20 1744 03/01/20 2002 03/01/20 2334 03/02/20 0350 03/02/20 0748  GLUCAP 248* 233* 231* 236* 235*   Sepsis Labs: Recent Labs  Lab 02/27/20 0830 02/27/20 1022  LATICACIDVEN 1.7 1.4    Recent Results (from the past 240 hour(s))  SARS Coronavirus 2 by RT PCR (hospital order, performed in [place] hospital  lab) Nasopharyngeal Nasopharyngeal Swab     Status: None   Collection Time: 02/27/20  9:50 AM   Specimen: Nasopharyngeal Swab  Result Value Ref Range Status   SARS Coronavirus 2 NEGATIVE NEGATIVE Final    Comment: (NOTE) SARS-CoV-2 target nucleic acids are NOT DETECTED. The SARS-CoV-2 RNA is generally detectable in upper and lower respiratory specimens during the acute phase of infection. The lowest concentration of SARS-CoV-2 viral copies this assay can detect is 250 copies / mL. A negative result does not preclude SARS-CoV-2 infection and should not be used as the sole basis for treatment or other patient management decisions.  A negative result may occur with improper specimen collection / handling, submission of specimen other than nasopharyngeal swab, presence of viral mutation(s) within the areas targeted by this assay, and inadequate number of viral copies (<250 copies / mL). A negative result  must be combined with clinical observations, patient history, and epidemiological information. Fact Sheet for Patients:   StrictlyIdeas.no Fact Sheet for Healthcare Providers: BankingDealers.co.za This test is not yet approved or cleared  by the [place] FDA and has been authorized for detection and/or diagnosis of SARS-CoV-2 by FDA under an Emergency Use Authorization (EUA).  This EUA will remain in effect (meaning this test can be used) for the duration of the COVID-19 declaration under Section 564(b)(1) of the Act, 21 U.S.C. section 360bbb-3(b)(1), unless the authorization is terminated or revoked sooner. Performed at [facility], [place] [address], [place], [place] [address]   Culture, blood (routine x 2)     Status: None (Preliminary result)   Collection Time: 02/27/20  9:59 AM   Specimen: BLOOD RIGHT WRIST  Result Value Ref Range Status   Specimen Description BLOOD RIGHT WRIST  Final   Special Requests   Final    BOTTLES DRAWN AEROBIC AND ANAEROBIC Blood Culture results may not be optimal due to an inadequate volume of blood received in culture bottles   Culture   Final    NO GROWTH 3 DAYS Performed at [facility], [place] [address], [place], [place] [address]    Report Status PENDING  Incomplete  Culture, blood (routine x 2)     Status: None (Preliminary result)   Collection Time: 02/27/20 10:22 AM   Specimen: BLOOD  Result Value Ref Range Status   Specimen Description BLOOD SITE NOT SPECIFIED  Final   Special Requests   Final    BOTTLES DRAWN AEROBIC AND ANAEROBIC Blood Culture results may not be optimal due to an excessive volume of blood received in culture bottles   Culture   Final    NO GROWTH 3 DAYS Performed at [facility], [place] [address], [place], [place] [address]    Report Status PENDING  Incomplete  MRSA PCR Screening     Status: None   Collection Time: 02/27/20 11:16 PM   Specimen: Nasal  Mucosa; Nasopharyngeal  Result Value Ref Range Status   MRSA by PCR NEGATIVE NEGATIVE Final    Comment:        The GeneXpert MRSA Assay (FDA approved for NASAL specimens only), is one component of a comprehensive MRSA colonization surveillance program. It is not intended to diagnose MRSA infection nor to guide or monitor treatment for MRSA infections. Performed at [facility], [place] [address], [place], [place] [address]   Urine culture     Status: Abnormal   Collection Time: 02/28/20  7:10 AM   Specimen: In/Out Cath Urine  Result Value Ref Range Status   Specimen Description IN/OUT CATH URINE  Final   Special Requests   Final    NONE Performed at [facility], [place] [address] [place], [place] [address]    Culture >=100,000 COLONIES/mL YEAST (A)  Final   Report Status 02/29/2020 FINAL  Final    Radiology Studies: No results found. Scheduled Meds: . acetaminophen (TYLENOL) oral liquid 160 mg/5 mL  500 mg Per Tube Q12H  . amLODipine  10 mg Per Tube Daily  . aspirin  81 mg Per J Tube Daily  . atorvastatin  80 mg Per Tube QHS  . brimonidine  1 drop Both Eyes BID  . carvedilol  3.125 mg Per Tube BID  . chlorhexidine  15 mL Mouth/Throat QID  . clopidogrel  75 mg Per Tube Daily  . donepezil  10 mg Per Tube Daily  . dorzolamide  1 drop Both Eyes TID  . enoxaparin (LOVENOX) injection  40 mg Subcutaneous Q24H  . feeding supplement (PRO-STAT SUGAR FREE 64)  30 mL Per Tube Daily  . ferrous sulfate  300 mg Per Tube Q breakfast  . fluconazole  100 mg Per Tube Daily  . free water  300 mL Per Tube Q6H  . insulin aspart  0-9 Units Subcutaneous Q4H  . insulin glargine  25 Units Subcutaneous Daily  . ipratropium-albuterol  3 mL Nebulization TID  . lactobacillus acidophilus  2 tablet Oral TID  . latanoprost  1 drop Both Eyes QHS  . Muscle Rub  1 application Topical U04V  . pantoprazole sodium  40 mg Per Tube QAC breakfast  . phosphorus  500 mg Per Tube TID  . polyethylene  glycol  17 g Per Tube BID  . senna-docusate  1 tablet Per Tube BID  . sertraline  25 mg Per Tube Daily  . sodium chloride flush  3 mL Intravenous Once   Continuous Infusions: . ceFEPime (MAXIPIME) IV 200 mL/hr at 03/02/20 0900  . feeding supplement (JEVITY 1.2 CAL) 40 mL/hr at 03/02/20 0900  . metronidazole 100 mL/hr at 03/02/20 0900  . vancomycin 200 mL/hr at 03/02/20 0900   Time spent: 25 minutes  Patrecia Pour, MD [facility] 03/02/2020, 9:31 AM

## 2020-03-02 NOTE — Plan of Care (Signed)

## 2020-03-03 LAB — GLUCOSE, CAPILLARY
Glucose-Capillary: 351 mg/dL — ABNORMAL HIGH (ref 70–99)
Glucose-Capillary: 352 mg/dL — ABNORMAL HIGH (ref 70–99)
Glucose-Capillary: 362 mg/dL — ABNORMAL HIGH (ref 70–99)
Glucose-Capillary: 362 mg/dL — ABNORMAL HIGH (ref 70–99)
Glucose-Capillary: 371 mg/dL — ABNORMAL HIGH (ref 70–99)
Glucose-Capillary: 387 mg/dL — ABNORMAL HIGH (ref 70–99)

## 2020-03-03 LAB — RENAL FUNCTION PANEL
Albumin: 1.8 g/dL — ABNORMAL LOW (ref 3.5–5.0)
Anion gap: 10 (ref 5–15)
BUN: 17 mg/dL (ref 8–23)
CO2: 21 mmol/L — ABNORMAL LOW (ref 22–32)
Calcium: 9 mg/dL (ref 8.9–10.3)
Chloride: 108 mmol/L (ref 98–111)
Creatinine, Ser: 0.89 mg/dL (ref 0.44–1.00)
GFR calc Af Amer: >60 mL/min (ref >60)
GFR calc non Af Amer: 57 mL/min — ABNORMAL LOW (ref >60)
Glucose, Bld: 381 mg/dL — ABNORMAL HIGH (ref 70–99)
Phosphorus: 1.8 mg/dL — ABNORMAL LOW (ref 2.5–4.6)
Potassium: 3 mmol/L — ABNORMAL LOW (ref 3.5–5.1)
Sodium: 139 mmol/L (ref 135–145)

## 2020-03-03 LAB — CBC
HCT: 32.2 % — ABNORMAL LOW (ref 36.0–46.0)
Hemoglobin: 9.7 g/dL — ABNORMAL LOW (ref 12.0–15.0)
MCH: 27.5 pg (ref 26.0–34.0)
MCHC: 30.1 g/dL (ref 30.0–36.0)
MCV: 91.2 fL (ref 80.0–100.0)
Platelets: 312 10*3/uL (ref 150–400)
RBC: 3.53 MIL/uL — ABNORMAL LOW (ref 3.87–5.11)
RDW: 13.9 % (ref 11.5–15.5)
WBC: 15.4 10*3/uL — ABNORMAL HIGH (ref 4.0–10.5)
nRBC: 0 % (ref 0.0–0.2)

## 2020-03-03 LAB — CULTURE, BLOOD (ROUTINE X 2)
Culture: NO GROWTH
Culture: NO GROWTH

## 2020-03-03 MED ORDER — POTASSIUM CHLORIDE 20 MEQ/15ML (10%) PO SOLN
40.0000 meq | Freq: Every day | ORAL | Status: DC
  Administered 2020-03-03 – 2020-03-04 (×2): 40 meq
  Filled 2020-03-03 (×2): qty 30

## 2020-03-03 MED ORDER — INSULIN GLARGINE 100 UNIT/ML ~~LOC~~ SOLN
50.0000 [IU] | Freq: Every day | SUBCUTANEOUS | Status: DC
  Administered 2020-03-03 – 2020-03-04 (×2): 50 [IU] via SUBCUTANEOUS
  Filled 2020-03-03 (×3): qty 0.5

## 2020-03-03 NOTE — Progress Notes (Signed)
Patient ID: [name], female   DOB: [birth year], 86 y.o.   MRN: [id number]  PROGRESS NOTE    [name]  PJK:[id number] DOB: [birth year] DOA: 02/27/2020 PCP: [name], MD   Brief Narrative:  86 year old female with history of unspecified CVA with residual right-sided weakness and dysphagia requiring PEG tube feeding, CAD status post CABG, complete heart block status post pacemaker, diabetes mellitus type 2, sleep apnea, aspiration pneumonia requiring admission and discharge from 01/27/2020-02/03/2020 presented on 02/27/2020 from SNF for shortness of breath after few episodes of vomiting the night prior to presentation.  EMS found her saturation in the 60s and patient was placed on nonrebreather.  In the ED, chest x-ray showed bilateral lower lobe infiltrates.  CT facial and neck showed right parotid swelling compatible with acute parotitis.  Sodium was 152.  She was started on IV fluids and antibiotics.  Assessment & Plan: Sepsis due to probable aspiration pneumonia as well as bacterial acute parotitis:  - Continue broad-spectrum antibiotics including MRSA coverage for parotitis; will complete 7 days  - Blood cultures NGTD  Acute hypoxic respiratory failure:  - Continue supplemental oxygen to maintain SpO2 >90% and normal respiratory effort. Currently improved to 2L, wean to room air as tolerated.  Acute metabolic encephalopathy on chronic dementia: Suspected to be due to severity of illness on baseline cerebrovascular disease. Has baseline right-sided weakness due to prior CVA. - Delirium precautions.  - Continue aricept - Not showing significant improvement, suspect this coincides with worsening prognosis/end of life.  Leukocytosis: Worsened though fever curve has improved. Possibly related to aspiration. Less likely yeast in urine culture, possibly from sacral wound, though no purulence noted. - Continue monitoring. Improved.  Yeast infection: Possibly infection of urinary  tract vs. cross contamination from vulvovaginal candidiasis.  - Continue diflucan.  Hypernatremia: Improved when free water restarted.   Hypercalcemia, severe dehydration - Improved with IVF, will stop now that restarting tube feeds  AKI on stage IIIa CKD: Presumed based on prior Cr values.  - Continue monitoring, improved.  Dysphagia - Restarted TFs 5/13, increasing rate to goal today. Tolerating thus far. - Continue free water flushes, decrease volume to minimize risk of aspiration.  Hypophosphatemia: Possibly refeeding syndrome.  - Supplement aggressively today, recheck daily x at least 3 days.  Hypokalemia: Supplement.  Hypertension - Continue amlodipine, coreg  History of unspecified stroke with residual right-sided weakness and dysphagia - Continue DAPT and statin  Diabetes mellitus type 2 with hyperglycemia: Recent HbA1c 8.6%. - Continue basal-bolus insulin. Increase basal insulin significantly today with TFs at goal. - Continue CBGs with SSI.  CAD s/p CABG, CHB s/p PPM:  - Continue aspirin, plavix, statin, coreg   Sleep apnea - Continue CPAP at night  Stage 3 sacral pressure injury including gluteal fold, resolving DTI to left heel, POA:  - WOC consulted, local wound care and offloading.   Generalized deconditioning: - Overall prognosis is poor. Palliative care consulted and guiding family discussions. Unclear whether patient would be able to rehabilitate at SNF. Would be appropriate for home hospice.   DVT prophylaxis: Lovenox Code Status:  DNR per palliative care discussions Family Communication: Family meeting with palliative care 5/13, will reach out to family again today. Disposition Plan: Status is: Inpatient  Remains inpatient appropriate because:Hemodynamically unstable. Critically ill still requiring supplemental oxygen by nasal cannula with altered mental status, dehydration and still requiring IV antibiotics.  Dispo: The patient is from: SNF  Anticipated d/c is to: Home with hospice following medical optimization. Possibly 5/17.              Anticipated d/c date is: > 3 days              Patient currently is not medically stable to d/c.    Consultants: Palliative care medicine team  Procedures: None  Antimicrobials: Cefepime, vancomycin, flagyl 02/27/2020 through 5/16.  Subjective: No complaints, remains nonverbal but more alert, follows me with eyes. Still not following commands.  Objective: Vitals:   03/03/20 0713 03/03/20 0800 03/03/20 1041 03/03/20 1459  BP: (!) 146/59  (!) 135/57   Pulse: 74  73   Resp: 20 20 20    Temp: 98.5 F (36.9 C)  98.7 F (37.1 C)   TempSrc: Oral  Axillary   SpO2: 97%  99% 100%  Weight:      Height:        Intake/Output Summary (Last 24 hours) at 03/03/2020 1503 Last data filed at 03/03/2020 0800 Gross per 24 hour  Intake 3340.84 ml  Output 1200 ml  Net 2140.84 ml   Filed Weights   02/27/20 0832 02/27/20 2300  Weight: 74.9 kg 71.9 kg   Examination: Gen: Frail, elderly female in no distress Pulm: Nonlabored, rate 20/min still coarse breath sounds though improved. CV: Regular rate and rhythm. No murmur, rub, or gallop. No JVD, unchanged moderate R-sided edema. GI: Abdomen soft, non-tender, non-distended, with normoactive bowel sounds.  Ext: Warm, no deformities Skin: No new rashes, lesions or ulcers on visualized skin. PEG site c/d/i Neuro: Alert, nonverbal, not following commands, tracks and localizes to voice and touch. Psych: UTD  Data Reviewed: I have personally reviewed following labs and imaging studies  CBC: Recent Labs  Lab 02/27/20 0830 02/27/20 0830 02/28/20 0238 02/29/20 1145 03/01/20 0237 03/02/20 0304 03/03/20 0240  WBC 12.8*   < > 10.3 17.4* 17.7* 15.5* 15.4*  NEUTROABS 10.2*  --   --   --   --   --   --   HGB 11.9*   < > 11.0* 9.0* 9.1* 9.6* 9.7*  HCT 40.3   < > 38.6 30.5* 30.6* 31.7* 32.2*  MCV 95.3   < > 95.5 92.7 92.4 91.1 91.2  PLT 369   < >  323 288 293 293 312   < > = values in this interval not displayed.   Basic Metabolic Panel: Recent Labs  Lab 02/28/20 0238 02/29/20 1145 03/01/20 0237 03/02/20 0304 03/03/20 0240  NA 152* 144 137 140 139  K 3.4* 3.2* 3.4* 3.2* 3.0*  CL 115* 112* 108 108 108  CO2 27 25 23 22  21*  GLUCOSE 251* 153* 264* 254* 381*  BUN 30* 36* 29* 18 17  CREATININE 1.20* 1.24* 1.11* 0.91 0.89  CALCIUM 11.5* 10.6* 9.8 9.5 9.0  MG  --   --  1.7  --   --   PHOS  --   --   --  1.4* 1.8*   Liver Function Tests: Recent Labs  Lab 02/27/20 0830 03/02/20 0304 03/03/20 0240  AST 24 19  --   ALT 46* 23  --   ALKPHOS 100 83  --   BILITOT 0.6 0.5  --   PROT 7.5 5.8*  --   ALBUMIN 2.9* 1.9* 1.8*   Coagulation Profile: Recent Labs  Lab 02/27/20 1022  INR 1.1   CBG: Recent Labs  Lab 03/02/20 1928 03/02/20 2310 03/03/20 0342 03/03/20 0824 03/03/20 [place]  339* 380* 362* 351* 371*   Sepsis Labs: Recent Labs  Lab 02/27/20 0830 02/27/20 1022  LATICACIDVEN 1.7 1.4    Recent Results (from the past 240 hour(s))  SARS Coronavirus 2 by RT PCR (hospital order, performed in [facility] hospital lab) Nasopharyngeal Nasopharyngeal Swab     Status: None   Collection Time: 02/27/20  9:50 AM   Specimen: Nasopharyngeal Swab  Result Value Ref Range Status   SARS Coronavirus 2 NEGATIVE NEGATIVE Final    Comment: (NOTE) SARS-CoV-2 target nucleic acids are NOT DETECTED. The SARS-CoV-2 RNA is generally detectable in upper and lower respiratory specimens during the acute phase of infection. The lowest concentration of SARS-CoV-2 viral copies this assay can detect is 250 copies / mL. A negative result does not preclude SARS-CoV-2 infection and should not be used as the sole basis for treatment or other patient management decisions.  A negative result may occur with improper specimen collection / handling, submission of specimen other than nasopharyngeal swab, presence of viral mutation(s) within  the areas targeted by this assay, and inadequate number of viral copies (<250 copies / mL). A negative result must be combined with clinical observations, patient history, and epidemiological information. Fact Sheet for Patients:   StrictlyIdeas.no Fact Sheet for Healthcare Providers: BankingDealers.co.za This test is not yet approved or cleared  by the [place] FDA and has been authorized for detection and/or diagnosis of SARS-CoV-2 by FDA under an Emergency Use Authorization (EUA).  This EUA will remain in effect (meaning this test can be used) for the duration of the COVID-19 declaration under Section 564(b)(1) of the Act, 21 U.S.C. section 360bbb-3(b)(1), unless the authorization is terminated or revoked sooner. Performed at [facility], [place] [address] [place], [place] [address]   Culture, blood (routine x 2)     Status: None   Collection Time: 02/27/20  9:59 AM   Specimen: BLOOD RIGHT WRIST  Result Value Ref Range Status   Specimen Description BLOOD RIGHT WRIST  Final   Special Requests   Final    BOTTLES DRAWN AEROBIC AND ANAEROBIC Blood Culture results may not be optimal due to an inadequate volume of blood received in culture bottles   Culture   Final    NO GROWTH 5 DAYS Performed at [facility], [place] [address], [place], [place] [address]    Report Status 03/03/2020 FINAL  Final  Culture, blood (routine x 2)     Status: None   Collection Time: 02/27/20 10:22 AM   Specimen: BLOOD  Result Value Ref Range Status   Specimen Description BLOOD SITE NOT SPECIFIED  Final   Special Requests   Final    BOTTLES DRAWN AEROBIC AND ANAEROBIC Blood Culture results may not be optimal due to an excessive volume of blood received in culture bottles   Culture   Final    NO GROWTH 5 DAYS Performed at [facility], [place] [address], [place], [place] [address]    Report Status 03/03/2020 FINAL  Final  MRSA PCR Screening      Status: None   Collection Time: 02/27/20 11:16 PM   Specimen: Nasal Mucosa; Nasopharyngeal  Result Value Ref Range Status   MRSA by PCR NEGATIVE NEGATIVE Final    Comment:        The GeneXpert MRSA Assay (FDA approved for NASAL specimens only), is one component of a comprehensive MRSA colonization surveillance program. It is not intended to diagnose MRSA infection nor to guide  or monitor treatment for MRSA infections. Performed at [facility], [place] [address], [place], [place] [address]   Urine culture     Status: Abnormal   Collection Time: 02/28/20  7:10 AM   Specimen: In/Out Cath Urine  Result Value Ref Range Status   Specimen Description IN/OUT CATH URINE  Final   Special Requests   Final    NONE Performed at [facility], [place] [address], [place], [place] [address]    Culture >=100,000 COLONIES/mL YEAST (A)  Final   Report Status 02/29/2020 FINAL  Final    Radiology Studies: No results found. Scheduled Meds: . acetaminophen (TYLENOL) oral liquid 160 mg/5 mL  500 mg Per Tube Q12H  . amLODipine  10 mg Per Tube Daily  . aspirin  81 mg Per J Tube Daily  . atorvastatin  80 mg Per Tube QHS  . brimonidine  1 drop Both Eyes BID  . carvedilol  3.125 mg Per Tube BID  . chlorhexidine  15 mL Mouth/Throat QID  . clopidogrel  75 mg Per Tube Daily  . donepezil  10 mg Per Tube Daily  . dorzolamide  1 drop Both Eyes TID  . enoxaparin (LOVENOX) injection  40 mg Subcutaneous Q24H  . feeding supplement (PRO-STAT SUGAR FREE 64)  30 mL Per Tube Daily  . ferrous sulfate  300 mg Per Tube Q breakfast  . fluconazole  100 mg Per Tube Daily  . free water  300 mL Per Tube Q6H  . insulin aspart  0-9 Units Subcutaneous Q4H  . insulin glargine  50 Units Subcutaneous Daily  . ipratropium-albuterol  3 mL Nebulization TID  . lactobacillus acidophilus  2 tablet Oral TID  . latanoprost  1 drop Both Eyes QHS  . Muscle Rub  1 application Topical N39J  . pantoprazole sodium  40 mg  Per Tube QAC breakfast  . phosphorus  500 mg Per Tube TID  . polyethylene glycol  17 g Per Tube BID  . senna-docusate  1 tablet Per Tube BID  . sertraline  25 mg Per Tube Daily  . sodium chloride flush  3 mL Intravenous Once   Continuous Infusions: . ceFEPime (MAXIPIME) IV 2 g (03/03/20 1011)  . feeding supplement (JEVITY 1.2 CAL) 1,000 mL (03/03/20 1026)  . metronidazole 500 mg (03/03/20 0612)  . vancomycin 1,000 mg (03/03/20 1218)   Time spent: 35 minutes  Patrecia Pour, MD [facility] 03/03/2020, 3:03 PM

## 2020-03-04 LAB — CBC
HCT: 29.9 % — ABNORMAL LOW (ref 36.0–46.0)
Hemoglobin: 9.1 g/dL — ABNORMAL LOW (ref 12.0–15.0)
MCH: 27.2 pg (ref 26.0–34.0)
MCHC: 30.4 g/dL (ref 30.0–36.0)
MCV: 89.3 fL (ref 80.0–100.0)
Platelets: 300 10*3/uL (ref 150–400)
RBC: 3.35 MIL/uL — ABNORMAL LOW (ref 3.87–5.11)
RDW: 14.1 % (ref 11.5–15.5)
WBC: 14 10*3/uL — ABNORMAL HIGH (ref 4.0–10.5)
nRBC: 0 % (ref 0.0–0.2)

## 2020-03-04 LAB — GLUCOSE, CAPILLARY
Glucose-Capillary: 337 mg/dL — ABNORMAL HIGH (ref 70–99)
Glucose-Capillary: 313 mg/dL — ABNORMAL HIGH (ref 70–99)
Glucose-Capillary: 326 mg/dL — ABNORMAL HIGH (ref 70–99)
Glucose-Capillary: 339 mg/dL — ABNORMAL HIGH (ref 70–99)
Glucose-Capillary: 337 mg/dL — ABNORMAL HIGH (ref 70–99)
Glucose-Capillary: 324 mg/dL — ABNORMAL HIGH (ref 70–99)

## 2020-03-04 LAB — RENAL FUNCTION PANEL
Albumin: 1.7 g/dL — ABNORMAL LOW (ref 3.5–5.0)
Anion gap: 6 (ref 5–15)
BUN: 15 mg/dL (ref 8–23)
CO2: 25 mmol/L (ref 22–32)
Calcium: 9 mg/dL (ref 8.9–10.3)
Chloride: 110 mmol/L (ref 98–111)
Creatinine, Ser: 0.89 mg/dL (ref 0.44–1.00)
GFR calc Af Amer: >60 mL/min (ref >60)
GFR calc non Af Amer: 57 mL/min — ABNORMAL LOW (ref >60)
Glucose, Bld: 375 mg/dL — ABNORMAL HIGH (ref 70–99)
Phosphorus: 1.8 mg/dL — ABNORMAL LOW (ref 2.5–4.6)
Potassium: 3.1 mmol/L — ABNORMAL LOW (ref 3.5–5.1)
Sodium: 141 mmol/L (ref 135–145)

## 2020-03-04 LAB — VANCOMYCIN, TROUGH: Vancomycin Tr: 22 ug/mL (ref 15–20)

## 2020-03-04 MED ORDER — INSULIN ASPART 100 UNIT/ML ~~LOC~~ SOLN
0.0000 [IU] | SUBCUTANEOUS | Status: DC
  Administered 2020-03-04 – 2020-03-05 (×6): 15 [IU] via SUBCUTANEOUS
  Administered 2020-03-05 (×3): 11 [IU] via SUBCUTANEOUS
  Administered 2020-03-05: 15 [IU] via SUBCUTANEOUS
  Administered 2020-03-06 (×3): 11 [IU] via SUBCUTANEOUS

## 2020-03-04 MED ORDER — INSULIN ASPART 100 UNIT/ML ~~LOC~~ SOLN: 0.0000 [IU] | SUBCUTANEOUS | Status: DC

## 2020-03-04 MED ORDER — POTASSIUM CHLORIDE 20 MEQ/15ML (10%) PO SOLN
40.0000 meq | Freq: Two times a day (BID) | ORAL | Status: DC
  Administered 2020-03-04 – 2020-03-06 (×4): 40 meq
  Filled 2020-03-04 (×4): qty 30

## 2020-03-04 MED ORDER — VANCOMYCIN HCL 750 MG/150ML IV SOLN
750.0000 mg | INTRAVENOUS | Status: AC
  Administered 2020-03-04: 750 mg via INTRAVENOUS
  Filled 2020-03-04: qty 150

## 2020-03-04 NOTE — Progress Notes (Signed)
Pharmacy Antibiotic Note  [name] is a 86 y.o. female admitted on 02/27/2020 with pneumonia and sepsis. Pharmacy has been consulted for vancomycin and cefepime dosing.   Pt will compete 7d of abx today for PNA and parotitis. Cultures have been neg except for the yeast in the urine. Her vanc trough came back above goal today. She will on get 1 more dose today for the course.   Plan: Vanc 784m IV x1 to complete 7d  Height: 5' 8"  (172.7 cm) Weight: 71.9 kg (158 lb 8.2 oz) IBW/kg (Calculated) : 63.9  Temp (24hrs), Avg:98.3 F (36.8 C), Min:97.8 F (36.6 C), Max:98.6 F (37 C)  Recent Labs  Lab 02/27/20 0830 02/27/20 1022 02/28/20 0238 02/29/20 1145 03/01/20 0237 03/02/20 0304 03/03/20 0240 03/04/20 0208 03/04/20 1009  WBC 12.8*  --    < > 17.4* 17.7* 15.5* 15.4* 14.0*  --   CREATININE 1.03*  --    < > 1.24* 1.11* 0.91 0.89 0.89  --   LATICACIDVEN 1.7 1.4  --   --   --   --   --   --   --   VANCOTROUGH  --   --   --   --   --   --   --   --  22*   < > = values in this interval not displayed.    Estimated Creatinine Clearance: 43.2 mL/min (by C-G formula based on SCr of 0.89 mg/dL).    No Known Allergies  Antimicrobials this admission: Vanc 5/10>>5/16 Cefepime 5/10>>5/16 Azithro x 1 5/10  Dose adjustments this admission: N/A  MOnnie Boer PharmD, BCIDP, AAHIVP, CPP Infectious Disease Pharmacist 03/04/2020 12:53 PM

## 2020-03-04 NOTE — Progress Notes (Signed)
CRITICAL VALUE ALERT  Critical Value:  Vanc. trough: 20  Date & Time Notied: 03/05/2019, 10.47 am  Provider Notified: Janett Billow Pharmacist notified  Orders Received/Actions taken: waiting for the dose adjustment before giving the next dose.

## 2020-03-04 NOTE — Progress Notes (Signed)
Patient ID: [name], female   DOB: [birth year], 86 y.o.   MRN: [id number]  PROGRESS NOTE    YESHA MUCHOW  ZSM:[id number] DOB: August 10, 1928 DOA: 02/27/2020 PCP: [name], MD   Brief Narrative:  86 year old female with history of unspecified CVA with residual right-sided weakness and dysphagia requiring PEG tube feeding, CAD status post CABG, complete heart block status post pacemaker, diabetes mellitus type 2, sleep apnea, aspiration pneumonia requiring admission and discharge from 01/27/2020-02/03/2020 presented on 02/27/2020 from SNF for shortness of breath after few episodes of vomiting the night prior to presentation.  EMS found her saturation in the 60s and patient was placed on nonrebreather.  In the ED, chest x-ray showed bilateral lower lobe infiltrates.  CT facial and neck showed right parotid swelling compatible with acute parotitis.  Sodium was 152.  She was started on IV fluids and antibiotics.  Assessment & Plan: Sepsis due to probable aspiration pneumonia as well as bacterial acute parotitis:  - Continue broad-spectrum antibiotics including MRSA coverage for parotitis; will complete 7 days this evening. - Blood cultures NGTD  Acute hypoxic respiratory failure:  - Continue supplemental oxygen to maintain SpO2 >90% and normal respiratory effort. Currently improved to 2L, wean to room air as tolerated. Tolerating currently.  Acute metabolic encephalopathy on chronic dementia: Suspected to be due to severity of illness on baseline cerebrovascular disease. Has baseline right-sided weakness due to prior CVA. - Delirium precautions.  - Continue aricept - Not showing significant improvement, suspect this coincides with worsening prognosis/end of life.  Leukocytosis: Worsened though fever curve has improved. Possibly related to aspiration. Less likely yeast in urine culture, possibly from sacral wound, though no purulence noted. - Continue monitoring. Improved.  Yeast  infection: Possibly infection of urinary tract vs. cross contamination from vulvovaginal candidiasis.  - Continue diflucan.  Hypernatremia: Improved when free water restarted. Continue significant amount as Na is wnl and tachypnea likely causing increased insensible losses.  Hypercalcemia, severe dehydration: Improved  AKI on stage IIIa CKD: Presumed based on prior Cr values.  - Continue monitoring, improved.  Dysphagia - Restarted TFs 5/13, increasing rate to goal today. Tolerating thus far. - Continue free water flushes, decrease volume to minimize risk of aspiration.  Hypophosphatemia: Possibly refeeding syndrome.  - Supplement aggressively, remains low. Continue checking.  Hypokalemia: Supplement again today, increase to 40 BID  Hypertension - Continue amlodipine, coreg  History of unspecified stroke with residual right-sided weakness and dysphagia - Continue DAPT and statin  Diabetes mellitus type 2 with hyperglycemia: Recent HbA1c 8.6%, anticipate increased home dose of insulin at discharge. - Continue basal-bolus insulin. Increase basal insulin significantly with TFs at goal, remains hyperglycemic. Will augment sliding scale to resistant and calculate increased basal dosing off need. - Continue CBGs with SSI.  CAD s/p CABG, CHB s/p PPM:  - Continue aspirin, plavix, statin, coreg   Sleep apnea - Continue CPAP at night  Stage 3 sacral pressure injury including gluteal fold, resolving DTI to left heel, POA:  - WOC consulted, local wound care and offloading.   Generalized deconditioning: - Overall prognosis is poor. Palliative care consulted and guiding family discussions. Unclear whether patient would be able to rehabilitate at SNF. Would be appropriate for home hospice.   DVT prophylaxis: Lovenox Code Status:  DNR per palliative care discussions Family Communication: Family meeting with palliative care 5/13, will reach out to family again today. Disposition Plan:  Status is: Inpatient  Remains inpatient appropriate because:Hemodynamically unstable. Critically ill still requiring supplemental  oxygen by nasal cannula with altered mental status, dehydration and still requiring IV antibiotics.  Dispo: The patient is from: SNF              Anticipated d/c is to: Home with hospice following medical optimization. Possibly 5/17.              Anticipated d/c date is: 5/17              Patient currently is not medically stable to d/c.    Consultants: Palliative care medicine team  Procedures: None  Antimicrobials: Cefepime, vancomycin, flagyl 02/27/2020 through 5/16.  Subjective: Not interactive. CBGs remain elevated, no overnight events reported.  Objective: Vitals:   03/04/20 0659 03/04/20 0728 03/04/20 0731 03/04/20 1037  BP: (!) 142/52 (!) 144/45  (!) 111/55  Pulse: 77   80  Resp: 20 20  (!) 22  Temp: 98.3 F (36.8 C) 97.8 F (36.6 C)  98.6 F (37 C)  TempSrc: Axillary Axillary  Axillary  SpO2: 99% 95% 96% 98%  Weight:      Height:        Intake/Output Summary (Last 24 hours) at 03/04/2020 1056 Last data filed at 03/04/2020 6256 Gross per 24 hour  Intake 3380 ml  Output 1950 ml  Net 1430 ml   Filed Weights   02/27/20 0832 02/27/20 2300  Weight: 74.9 kg 71.9 kg   Examination: Gen: 86 y.o. female in no distress Pulm: Nonlabored with no wheezes or crackles. CV: Regular rate and rhythm. No murmur, rub, or gallop. No JVD, stable R extremity edema. GI: Abdomen soft, non-tender, non-distended, with normoactive bowel sounds.  Ext: Warm, no deformities Skin: No new rashes, lesions or ulcers on visualized skin. PEG c/d/i Neuro: Rousable but falls back asleep, nonverbal, minimally interactive. Psych: UTD   Data Reviewed: I have personally reviewed following labs and imaging studies  CBC: Recent Labs  Lab 02/27/20 0830 02/28/20 0238 02/29/20 1145 03/01/20 0237 03/02/20 0304 03/03/20 0240 03/04/20 0208  WBC 12.8*   < > 17.4* 17.7*  15.5* 15.4* 14.0*  NEUTROABS 10.2*  --   --   --   --   --   --   HGB 11.9*   < > 9.0* 9.1* 9.6* 9.7* 9.1*  HCT 40.3   < > 30.5* 30.6* 31.7* 32.2* 29.9*  MCV 95.3   < > 92.7 92.4 91.1 91.2 89.3  PLT 369   < > 288 293 293 312 300   < > = values in this interval not displayed.   Basic Metabolic Panel: Recent Labs  Lab 02/29/20 1145 03/01/20 0237 03/02/20 0304 03/03/20 0240 03/04/20 0208  NA 144 137 140 139 141  K 3.2* 3.4* 3.2* 3.0* 3.1*  CL 112* 108 108 108 110  CO2 25 23 22  21* 25  GLUCOSE 153* 264* 254* 381* 375*  BUN 36* 29* 18 17 15   CREATININE 1.24* 1.11* 0.91 0.89 0.89  CALCIUM 10.6* 9.8 9.5 9.0 9.0  MG  --  1.7  --   --   --   PHOS  --   --  1.4* 1.8* 1.8*   Liver Function Tests: Recent Labs  Lab 02/27/20 0830 03/02/20 0304 03/03/20 0240 03/04/20 0208  AST 24 19  --   --   ALT 46* 23  --   --   ALKPHOS 100 83  --   --   BILITOT 0.6 0.5  --   --   PROT 7.5 5.8*  --   --  ALBUMIN 2.9* 1.9* 1.8* 1.7*   Coagulation Profile: Recent Labs  Lab 02/27/20 1022  INR 1.1   CBG: Recent Labs  Lab 03/03/20 1536 03/03/20 1934 03/03/20 2344 03/04/20 0326 03/04/20 0734  GLUCAP 387* 362* 352* 337* 313*   Sepsis Labs: Recent Labs  Lab 02/27/20 0830 02/27/20 1022  LATICACIDVEN 1.7 1.4    Recent Results (from the past 240 hour(s))  SARS Coronavirus 2 by RT PCR (hospital order, performed in [facility] hospital lab) Nasopharyngeal Nasopharyngeal Swab     Status: None   Collection Time: 02/27/20  9:50 AM   Specimen: Nasopharyngeal Swab  Result Value Ref Range Status   SARS Coronavirus 2 NEGATIVE NEGATIVE Final    Comment: (NOTE) SARS-CoV-2 target nucleic acids are NOT DETECTED. The SARS-CoV-2 RNA is generally detectable in upper and lower respiratory specimens during the acute phase of infection. The lowest concentration of SARS-CoV-2 viral copies this assay can detect is 250 copies / mL. A negative result does not preclude SARS-CoV-2 infection and should  not be used as the sole basis for treatment or other patient management decisions.  A negative result may occur with improper specimen collection / handling, submission of specimen other than nasopharyngeal swab, presence of viral mutation(s) within the areas targeted by this assay, and inadequate number of viral copies (<250 copies / mL). A negative result must be combined with clinical observations, patient history, and epidemiological information. Fact Sheet for Patients:   StrictlyIdeas.no Fact Sheet for Healthcare Providers: BankingDealers.co.za This test is not yet approved or cleared  by the [place] FDA and has been authorized for detection and/or diagnosis of SARS-CoV-2 by FDA under an Emergency Use Authorization (EUA).  This EUA will remain in effect (meaning this test can be used) for the duration of the COVID-19 declaration under Section 564(b)(1) of the Act, 21 U.S.C. section 360bbb-3(b)(1), unless the authorization is terminated or revoked sooner. Performed at [facility], [place] [address] [place], [place] [address]   Culture, blood (routine x 2)     Status: None   Collection Time: 02/27/20  9:59 AM   Specimen: BLOOD RIGHT WRIST  Result Value Ref Range Status   Specimen Description BLOOD RIGHT WRIST  Final   Special Requests   Final    BOTTLES DRAWN AEROBIC AND ANAEROBIC Blood Culture results may not be optimal due to an inadequate volume of blood received in culture bottles   Culture   Final    NO GROWTH 5 DAYS Performed at [facility], [place] [address], [place], [place] [address]    Report Status 03/03/2020 FINAL  Final  Culture, blood (routine x 2)     Status: None   Collection Time: 02/27/20 10:22 AM   Specimen: BLOOD  Result Value Ref Range Status   Specimen Description BLOOD SITE NOT SPECIFIED  Final   Special Requests   Final    BOTTLES DRAWN AEROBIC AND ANAEROBIC Blood Culture results may not be  optimal due to an excessive volume of blood received in culture bottles   Culture   Final    NO GROWTH 5 DAYS Performed at [facility], [place] [address], [place], [place] [address]    Report Status 03/03/2020 FINAL  Final  MRSA PCR Screening     Status: None   Collection Time: 02/27/20 11:16 PM   Specimen: Nasal Mucosa; Nasopharyngeal  Result Value Ref Range Status   MRSA by PCR NEGATIVE NEGATIVE Final    Comment:  The GeneXpert MRSA Assay (FDA approved for NASAL specimens only), is one component of a comprehensive MRSA colonization surveillance program. It is not intended to diagnose MRSA infection nor to guide or monitor treatment for MRSA infections. Performed at [facility], [place] [address] [place], [place] [address]   Urine culture     Status: Abnormal   Collection Time: 02/28/20  7:10 AM   Specimen: In/Out Cath Urine  Result Value Ref Range Status   Specimen Description IN/OUT CATH URINE  Final   Special Requests   Final    NONE Performed at [facility], [place] [address], [place], [place] [address]    Culture >=100,000 COLONIES/mL YEAST (A)  Final   Report Status 02/29/2020 FINAL  Final    Radiology Studies: No results found. Scheduled Meds: . acetaminophen (TYLENOL) oral liquid 160 mg/5 mL  500 mg Per Tube Q12H  . amLODipine  10 mg Per Tube Daily  . aspirin  81 mg Per J Tube Daily  . atorvastatin  80 mg Per Tube QHS  . brimonidine  1 drop Both Eyes BID  . carvedilol  3.125 mg Per Tube BID  . chlorhexidine  15 mL Mouth/Throat QID  . clopidogrel  75 mg Per Tube Daily  . donepezil  10 mg Per Tube Daily  . dorzolamide  1 drop Both Eyes TID  . enoxaparin (LOVENOX) injection  40 mg Subcutaneous Q24H  . feeding supplement (PRO-STAT SUGAR FREE 64)  30 mL Per Tube Daily  . ferrous sulfate  300 mg Per Tube Q breakfast  . fluconazole  100 mg Per Tube Daily  . free water  300 mL Per Tube Q6H  . insulin aspart  0-20 Units Subcutaneous Q4H  .  insulin glargine  50 Units Subcutaneous Daily  . ipratropium-albuterol  3 mL Nebulization TID  . lactobacillus acidophilus  2 tablet Oral TID  . latanoprost  1 drop Both Eyes QHS  . Muscle Rub  1 application Topical I33A  . pantoprazole sodium  40 mg Per Tube QAC breakfast  . phosphorus  500 mg Per Tube TID  . polyethylene glycol  17 g Per Tube BID  . potassium chloride  40 mEq Per Tube Daily  . senna-docusate  1 tablet Per Tube BID  . sertraline  25 mg Per Tube Daily  . sodium chloride flush  3 mL Intravenous Once   Continuous Infusions: . ceFEPime (MAXIPIME) IV 2 g (03/04/20 0933)  . feeding supplement (JEVITY 1.2 CAL) 60 mL/hr at 03/04/20 0600  . metronidazole 500 mg (03/04/20 0501)  . vancomycin     Time spent: 35 minutes  Patrecia Pour, MD [facility] 03/04/2020, 10:56 AM

## 2020-03-05 DIAGNOSIS — R1319 Other dysphagia: Secondary | ICD-10-CM

## 2020-03-05 LAB — CBC
HCT: 29.7 % — ABNORMAL LOW (ref 36.0–46.0)
Hemoglobin: 8.9 g/dL — ABNORMAL LOW (ref 12.0–15.0)
MCH: 27.4 pg (ref 26.0–34.0)
MCHC: 30 g/dL (ref 30.0–36.0)
MCV: 91.4 fL (ref 80.0–100.0)
Platelets: 305 10*3/uL (ref 150–400)
RBC: 3.25 MIL/uL — ABNORMAL LOW (ref 3.87–5.11)
RDW: 14.7 % (ref 11.5–15.5)
WBC: 10.7 10*3/uL — ABNORMAL HIGH (ref 4.0–10.5)
nRBC: 0.2 % (ref 0.0–0.2)

## 2020-03-05 LAB — GLUCOSE, CAPILLARY
Glucose-Capillary: 280 mg/dL — ABNORMAL HIGH (ref 70–99)
Glucose-Capillary: 283 mg/dL — ABNORMAL HIGH (ref 70–99)
Glucose-Capillary: 290 mg/dL — ABNORMAL HIGH (ref 70–99)
Glucose-Capillary: 292 mg/dL — ABNORMAL HIGH (ref 70–99)
Glucose-Capillary: 303 mg/dL — ABNORMAL HIGH (ref 70–99)
Glucose-Capillary: 325 mg/dL — ABNORMAL HIGH (ref 70–99)

## 2020-03-05 LAB — RENAL FUNCTION PANEL
Albumin: 1.7 g/dL — ABNORMAL LOW (ref 3.5–5.0)
Anion gap: 7 (ref 5–15)
BUN: 16 mg/dL (ref 8–23)
CO2: 24 mmol/L (ref 22–32)
Calcium: 9.3 mg/dL (ref 8.9–10.3)
Chloride: 112 mmol/L — ABNORMAL HIGH (ref 98–111)
Creatinine, Ser: 0.87 mg/dL (ref 0.44–1.00)
GFR calc Af Amer: >60 mL/min (ref >60)
GFR calc non Af Amer: 59 mL/min — ABNORMAL LOW (ref >60)
Glucose, Bld: 355 mg/dL — ABNORMAL HIGH (ref 70–99)
Phosphorus: 1.8 mg/dL — ABNORMAL LOW (ref 2.5–4.6)
Potassium: 4.3 mmol/L (ref 3.5–5.1)
Sodium: 143 mmol/L (ref 135–145)

## 2020-03-05 MED ORDER — SERTRALINE HCL 25 MG PO TABS: 25.0000 mg | ORAL_TABLET | Freq: Every day | ORAL | 0 refills | Status: AC

## 2020-03-05 MED ORDER — INSULIN GLARGINE 100 UNIT/ML ~~LOC~~ SOLN: 30.0000 [IU] | Freq: Two times a day (BID) | SUBCUTANEOUS | 0 refills | Status: AC

## 2020-03-05 MED ORDER — LATANOPROST 0.005 % OP SOLN: 1.0000 [drp] | Freq: Every day | OPHTHALMIC | 0 refills | Status: AC

## 2020-03-05 MED ORDER — CARVEDILOL 3.125 MG PO TABS: 3.1250 mg | ORAL_TABLET | Freq: Two times a day (BID) | ORAL | 0 refills | Status: AC

## 2020-03-05 MED ORDER — GLUCERNA 1.5 CAL PO LIQD: 237.0000 mL | Freq: Four times a day (QID) | ORAL | 0 refills | Status: AC

## 2020-03-05 MED ORDER — DONEPEZIL HCL 10 MG PO TABS: 10.0000 mg | ORAL_TABLET | Freq: Every day | ORAL | 0 refills | Status: AC

## 2020-03-05 MED ORDER — CLOPIDOGREL BISULFATE 75 MG PO TABS: 75.0000 mg | ORAL_TABLET | Freq: Every day | ORAL | 0 refills | Status: AC

## 2020-03-05 MED ORDER — SCOPOLAMINE 1 MG/3DAYS TD PT72: 1.0000 | MEDICATED_PATCH | TRANSDERMAL | 0 refills | Status: AC

## 2020-03-05 MED ORDER — AMLODIPINE BESYLATE 10 MG PO TABS: 10.0000 mg | ORAL_TABLET | Freq: Every day | ORAL | 0 refills | Status: AC

## 2020-03-05 MED ORDER — FREE WATER: 200.0000 mL | Freq: Four times a day (QID) | 0 refills | Status: AC

## 2020-03-05 MED ORDER — TRAMADOL HCL 50 MG PO TABS: 50.0000 mg | ORAL_TABLET | Freq: Two times a day (BID) | ORAL | 0 refills | Status: AC | PRN

## 2020-03-05 MED ORDER — JEVITY 1.2 CAL PO LIQD: ORAL | 0 refills | Status: AC

## 2020-03-05 MED ORDER — ATORVASTATIN CALCIUM 80 MG PO TABS: 80.0000 mg | ORAL_TABLET | Freq: Every day | ORAL | 0 refills | Status: AC

## 2020-03-05 MED ORDER — BRIMONIDINE TARTRATE 0.2 % OP SOLN: 1.0000 [drp] | Freq: Two times a day (BID) | OPHTHALMIC | 0 refills | Status: AC

## 2020-03-05 MED ORDER — SENNOSIDES-DOCUSATE SODIUM 8.6-50 MG PO TABS: 1.0000 | ORAL_TABLET | Freq: Two times a day (BID) | ORAL | 0 refills | Status: AC

## 2020-03-05 MED ORDER — ASPIRIN 81 MG PO CHEW: 81.0000 mg | CHEWABLE_TABLET | Freq: Every day | ORAL | 0 refills | Status: AC

## 2020-03-05 MED ORDER — INSULIN GLARGINE 100 UNIT/ML ~~LOC~~ SOLN
80.0000 [IU] | Freq: Every day | SUBCUTANEOUS | Status: DC
  Administered 2020-03-05 – 2020-03-06 (×2): 80 [IU] via SUBCUTANEOUS
  Filled 2020-03-05 (×2): qty 0.8

## 2020-03-05 MED ORDER — PANTOPRAZOLE SODIUM 40 MG PO PACK: 40.0000 mg | PACK | Freq: Every day | ORAL | 0 refills | Status: AC

## 2020-03-05 MED ORDER — DORZOLAMIDE HCL 2 % OP SOLN: 1.0000 [drp] | Freq: Three times a day (TID) | OPHTHALMIC | 0 refills | Status: AC

## 2020-03-05 MED ORDER — POTASSIUM CHLORIDE 20 MEQ/15ML (10%) PO SOLN: 40.0000 meq | Freq: Every day | ORAL | 0 refills | Status: AC

## 2020-03-05 MED ORDER — FERROUS SULFATE 220 (44 FE) MG/5ML PO ELIX: 308.0000 mg | ORAL_SOLUTION | Freq: Every day | ORAL | 0 refills | Status: AC

## 2020-03-05 MED ORDER — POLYETHYLENE GLYCOL 3350 17 G PO PACK: 17.0000 g | PACK | Freq: Two times a day (BID) | ORAL | 0 refills | Status: AC | PRN

## 2020-03-05 NOTE — Progress Notes (Signed)
MD came to talk to the family regarding strong pulsation on the left jugular. Pt. No complaints presented.

## 2020-03-05 NOTE — TOC Progression Note (Signed)
Transition of Care [facility]) - Progression Note    Patient Details  Name: ALEYA DURNELL MRN: [id number] Date of Birth: 06/12/1928  Transition of Care [facility]) CM/SW Contact  Bartholomew Crews, RN Phone Number: [phone] 03/05/2020, 12:54 PM  Clinical Narrative:     Damaris Schooner with case manager, Karalee Height, at [facility] [phone]) for discharge planning - advised of home with hospice ([facility]) and need for PTAR transport home.   Expected Discharge Plan: Home w Hospice Care Barriers to Discharge: No Barriers Identified  Expected Discharge Plan and Services Expected Discharge Plan: [place]   Discharge Planning Services: CM Consult   Living arrangements for the past 2 months: Skilled Nursing Facility Expected Discharge Date: 03/05/20                                     Social Determinants of Health (SDOH) Interventions    Readmission Risk Interventions Readmission Risk Prevention Plan 03/01/2020  Transportation Screening Complete  Medication Review [employer]) Complete  HRI or Home Care Consult Complete  SW Recovery Care/Counseling Consult Complete  Palliative Care Screening Complete  Some recent data might be hidden

## 2020-03-05 NOTE — TOC Progression Note (Signed)
Transition of Care [facility]) - Progression Note    Patient Details  Name: [name] MRN: [id number] Date of Birth: [birth year]  Transition of Care [facility]) CM/SW Contact  Bartholomew Crews, RN Phone Number: [phone] 03/05/2020, 4:08 PM  Clinical Narrative:     Spoke with [facility] liaison, Bevely Palmer, about DME delivery. DME ordered but delivery time still pending. Bevely Palmer expressed concerns about late transport of patient, and that it may be best for patient and family to schedule transport in the morning.   Spoke with patient's daughter, [name], who stated that she had received call to confirm delivery intentions, but had not received call to schedule time of delivery. Discussed option for patient to transport home in the morning. [place] agreeable to this. Home address in Epic confirmed.   Secure chat sent to MD to discuss this option of patient transporting home in the morning via PTAR d/t late hour of DME delivery. MD in agreement.   TOC to confirm DME delivery in the AM, and arrange PTAR transport.   Expected Discharge Plan: Home w Hospice Care Barriers to Discharge: No Barriers Identified  Expected Discharge Plan and Services Expected Discharge Plan: [place]   Discharge Planning Services: CM Consult   Living arrangements for the past 2 months: Skilled Nursing Facility Expected Discharge Date: 03/05/20                                     Social Determinants of Health (SDOH) Interventions    Readmission Risk Interventions Readmission Risk Prevention Plan 03/01/2020  Transportation Screening Complete  Medication Review [employer]) Complete  HRI or Home Care Consult Complete  SW Recovery Care/Counseling Consult Complete  Palliative Care Screening Complete  Some recent data might be hidden

## 2020-03-05 NOTE — Progress Notes (Addendum)
Hydrologist [facility]) Hospital Liaison: RN note     Notified by Transition of [place], RN of patient/family request for [facility] services at home after discharge. Chart and patient information reviewed by [facility] physician. Hospice eligibility confirmed.     Writer spoke with daughter, [name] to initiate education related to hospice philosophy, services and team approach to care. [name] verbalized understanding of information given. Per discussion, plan is for discharge to home by  PTAR.   Please send signed and completed DNR form home with patient/family. Patient will need prescriptions for discharge comfort medications.      DME needs have been discussed, patient currently has the following equipment in the home: none.  Patient/family requests the following DME for delivery to the home: oxygen, hospital bed, W/C and hoyer lift. [place] equipment manager has been notified and will contact DME provider to arrange delivery to the home. Home address has been verified and is correct in the chart.                [name] is the family member to contact to arrange time of delivery.      [facility] aware of the above. Please notify [facility] when patient is ready to leave the unit at discharge. (Call [phone] or [phone] after 5pm.) [facility] information and contact numbers given to [facility].       Please call with any hospice related questions.      Thank you for this referral.      Farrel Gordon, RN, [facility] (listed on [place] under [place])   [phone]

## 2020-03-05 NOTE — Progress Notes (Signed)
2 daughters and granddaughter at bedside. Started teaching on how to start tube feeding and how to give meds thru peg tube and how to clean the peg site and check for any sign of infections. They are all perceptive   and willing to learn. They video taped  the process and asked questions.

## 2020-03-05 NOTE — Progress Notes (Signed)
Patient ID: Renee Rival, female   DOB: Feb 23, 1928, 86 y.o.   MRN: [id number]  This NP visited patient at the bedside as a follow up  for palliative medicine needs and emotional support.  Patient is alert today, makes eye contact.  Daughter is at bedside.   She is able to communicate to her daughter that she is not experiencing any pain currently.   Continued conversation regarding current medical situation.  Daughter verbalizes a clear understanding of the seriousness of her mother's current medical situation and the likelihood of continued decompensation. Family is comfortable with their decision to focus on comfort and dignity and transition care to home with hospice.  Education offered on the natural trajectory and expectations at end of life.  Discussed treatment plan with Dr. Leana Gamer and patient will likely discharge home tomorrow after hospice services have been set up and family is educated on care and utilization of PEG tube and tube feeds.  Plan of care: -DNR/DNI -Continue artificial feeding via PEG -Convert any needed IV medications to oral/via PEG tube -Transition patient home with hospice services  Discussed with family the importance of continued conversation with other family members and the  medical providers regarding overall plan of care and treatment options,  ensuring decisions are within the context of the patients values and GOCs.  Questions and concerns addressed     Total time spent on the unit was 35 minutes  Greater than 50% of the time was spent in counseling and coordination of care   Wadie Lessen NP  Palliative Medicine Team Team Phone # [phone] Pager [phone]

## 2020-03-05 NOTE — Discharge Summary (Signed)
Physician Discharge Summary  [name] UQJ:[id number] DOB: 1928/05/01 DOA: 02/27/2020  PCP: [name], MD  Admit date: 02/27/2020 Discharge date: 03/05/2020  Admitted From: SNF Disposition: Home with hospice   Recommendations for Outpatient Follow-up:  1. Follow up BMP and blood sugars for continued modifications. 2. Hospice at home to continue with family support and transition toward comfort measures.  Home Health: Hospice Equipment/Devices: Per hospice. No hypoxemia, though could have oxygen if desired for comfort Discharge Condition: Stable for transfer, improved from admission, retains poor prognosis CODE STATUS: DNR Diet recommendation: Jevity, Glucerna as below  Brief/Interim Summary: 86 year old female with history of unspecified CVA with residual right-sided weakness and dysphagia requiring PEG tube feeding, CAD status post CABG, complete heart block status post pacemaker, diabetes mellitus type 2, sleep apnea, aspiration pneumonia requiring admission and discharge from 01/27/2020-02/03/2020 presented on 02/27/2020 from SNF for shortness of breath after few episodes of vomiting the night prior to presentation.  EMS found her saturation in the 60s and patient was placed on nonrebreather.  In the ED, chest x-ray showed bilateral lower lobe infiltrates.  CT facial and neck showed right parotid swelling compatible with acute parotitis.  Sodium was 152.  She was started on IV fluids and antibiotics.  With treatments, respiratory status improved and tube feeds were cautiously restarted without incident. Palliative care has been involved, confirming that the patient's family would like to transition to home hospice. She has completed antibiotics and is stable for discharge.  Discharge Diagnoses:  Active Problems:   Weakness generalized   Pressure injury of skin   Hypoxia   Adult failure to thrive   Palliative care by specialist   DNR (do not resuscitate)  Sepsis due to probable  aspiration pneumonia as well as bacterial acute parotitis:  - Completed broad-spectrum antibiotics including MRSA coverage for parotitis while inpatient, no further Tx required. Parotid swelling resolved. - Blood cultures negative  Acute hypoxic respiratory failure:  - Continue supplemental oxygen to maintain SpO2 >90% and normal respiratory effort. Currently weaned to room air, 98%.  Acute metabolic encephalopathy on chronic dementia: Suspected to be due to severity of illness on baseline cerebrovascular disease. Has baseline right-sided weakness due to prior CVA. - Continue aricept - Not showing significant improvement, suspect this coincides with worsening prognosis/end of life.  Leukocytosis: Has improved with antimicrobial Tx.  Yeast infection: Possibly infection of urinary tract vs. cross contamination from vulvovaginal candidiasis.  - Treated with diflucan.  Hypernatremia: Improved when free water restarted. Continue significant amount as Na is wnl and tachypnea likely causing increased insensible losses.  Hypercalcemia, severe dehydration: Improved  AKI on stage IIIa CKD: Presumed based on prior Cr values.  - Continue monitoring, improved.  Dysphagia - Restarted TFs. Tolerating thus far. - Continue free water, Na 141.  Hypophosphatemia: Treated with supplementation, showed improvement.  Hypokalemia: Supplement regularly   Hypertension - Continue amlodipine, coreg  History of unspecified stroke with residual right-sided weakness and dysphagia - Continue DAPT and statin  Diabetes mellitus type 2 with hyperglycemia: Recent HbA1c 8.6%, anticipate increased home dose of insulin at discharge. - Continue basal insulin, increase from PTA dose to 30u BID.  - Continue CBGs with SSI.  CAD s/p CABG, CHB s/p PPM:  - Continue aspirin, plavix, statin, coreg   Sleep apnea - Continue CPAP at night  Stage 3 sacral pressure injury including gluteal fold, resolving  DTI to left heel, POA: Not infected. - Local wound care and offloading.   Generalized deconditioning: - Overall  prognosis is poor. Palliative care consulted and guiding family discussions. Unclear whether patient would be able to rehabilitate at SNF. Would be appropriate for home hospice.   RN Pressure Injury Documentation: Pressure Injury 12/29/19 Heel Right Unstageable - Full thickness tissue loss in which the base of the injury is covered by slough (yellow, tan, gray, green or brown) and/or eschar (tan, brown or black) in the wound bed. Black; dry; cracking (Active)  12/29/19 2300  Location: Heel  Location Orientation: Right  Staging: Unstageable - Full thickness tissue loss in which the base of the injury is covered by slough (yellow, tan, gray, green or brown) and/or eschar (tan, brown or black) in the wound bed.  Wound Description (Comments): Black; dry; cracking  Present on Admission: Yes     Pressure Injury 02/27/20 Sacrum Mid Stage 2 -  Partial thickness loss of dermis presenting as a shallow open injury with a red, pink wound bed without slough. whitish pink; 1.5 x 0.5 cm (Active)  02/27/20 2300  Location: Sacrum  Location Orientation: Mid  Staging: Stage 2 -  Partial thickness loss of dermis presenting as a shallow open injury with a red, pink wound bed without slough.  Wound Description (Comments): whitish pink; 1.5 x 0.5 cm  Present on Admission: Yes   Discharge Instructions Discharge Instructions    Diet - low sodium heart healthy   Complete by: As directed    Discharge instructions   Complete by: As directed    You were admitted for aspiration pneumonia and parotid gland infection which have been completely treated. You are stable for discharge to home with the assistance of hospice care. Please follow up closely with them. Prescriptions for all her medications have been printed at discharge. Please only fill the prescriptions for medications that you do not already have  on hand.   Increase activity slowly   Complete by: As directed      Allergies as of 03/05/2020   No Known Allergies     Medication List    STOP taking these medications   Biofreeze 4 % Gel Generic drug: Menthol (Topical Analgesic)   chlorhexidine 0.12 % solution Commonly known as: PERIDEX     TAKE these medications   acetaminophen 325 MG tablet Commonly known as: TYLENOL Place 650 mg into feeding tube every 4 (four) hours as needed for mild pain (general discomfort). What changed: Another medication with the same name was removed. Continue taking this medication, and follow the directions you see here.   amLODipine 10 MG tablet Commonly known as: NORVASC Place 1 tablet (10 mg total) into feeding tube daily.   aspirin 81 MG chewable tablet Place 1 tablet (81 mg total) into feeding tube daily. What changed: how to take this   atorvastatin 80 MG tablet Commonly known as: LIPITOR Place 1 tablet (80 mg total) into feeding tube at bedtime.   brimonidine 0.2 % ophthalmic solution Commonly known as: ALPHAGAN Place 1 drop into both eyes 2 (two) times daily.   carvedilol 3.125 MG tablet Commonly known as: COREG Place 1 tablet (3.125 mg total) into feeding tube 2 (two) times daily.   clopidogrel 75 MG tablet Commonly known as: PLAVIX Place 1 tablet (75 mg total) into feeding tube daily.   Dermacloud Crea Apply 1 application topically in the morning and at bedtime.   donepezil 10 MG tablet Commonly known as: ARICEPT Place 1 tablet (10 mg total) into feeding tube daily.   dorzolamide 2 % ophthalmic solution Commonly known  as: TRUSOPT Place 1 drop into both eyes 3 (three) times daily.   feeding supplement (JEVITY 1.2 CAL) Liqd 40 ml/hr per tube continuously. What changed:   how much to take  how to take this  when to take this  additional instructions   feeding supplement (GLUCERNA 1.5 CAL) Liqd Place 237 mLs into feeding tube in the morning, at noon, in the  evening, and at bedtime. What changed: Another medication with the same name was changed. Make sure you understand how and when to take each.   ferrous sulfate 220 (44 Fe) MG/5ML solution Place 7 mLs (308 mg total) into feeding tube daily.   free water Soln Place 200 mLs into feeding tube in the morning, at noon, in the evening, and at bedtime.   insulin glargine 100 UNIT/ML injection Commonly known as: LANTUS Inject 0.3 mLs (30 Units total) into the skin 2 (two) times daily. What changed: how much to take   latanoprost 0.005 % ophthalmic solution Commonly known as: XALATAN Place 1 drop into both eyes at bedtime.   pantoprazole sodium 40 mg/20 mL Pack Commonly known as: PROTONIX Place 20 mLs (40 mg total) into feeding tube daily before breakfast.   polyethylene glycol 17 g packet Commonly known as: MIRALAX / GLYCOLAX Place 17 g into feeding tube 2 (two) times daily as needed. What changed:   when to take this  reasons to take this   potassium chloride 20 MEQ/15ML (10%) Soln Place 30 mLs (40 mEq total) into feeding tube daily.   scopolamine 1 MG/3DAYS Commonly known as: TRANSDERM-SCOP Place 1 patch (1.5 mg total) onto the skin every 3 (three) days.   senna-docusate 8.6-50 MG tablet Commonly known as: Senokot-S Place 1 tablet into feeding tube 2 (two) times daily.   sertraline 25 MG tablet Commonly known as: ZOLOFT Place 1 tablet (25 mg total) into feeding tube daily.   traMADol 50 MG tablet Commonly known as: ULTRAM Place 1 tablet (50 mg total) into feeding tube every 12 (twelve) hours as needed for severe pain.            Durable Medical Equipment  (From admission, onward)         Start     Ordered   03/05/20 1231  DME Oxygen  Once    Question Answer Comment  Length of Need 6 Months   Mode or (Route) Nasal cannula   Liters per Minute 2   Oxygen delivery system Gas      03/05/20 1230         Follow-up Information    [name], MD Follow up.    Specialty: Internal Medicine Contact information: [id number] [place] [address] [phone]          No Known Allergies  Consultations:  Palliative care, dietitian, diabetes coordinator, care management  Procedures/Studies: CT Soft Tissue Neck W Contrast  Result Date: 02/27/2020 CLINICAL DATA:  Sialadenitis. Vomiting. EXAM: CT NECK WITH CONTRAST TECHNIQUE: Multidetector CT imaging of the neck was performed using the standard protocol following the bolus administration of intravenous contrast. CONTRAST:  60m OMNIPAQUE IOHEXOL 300 MG/ML  SOLN COMPARISON:  Neck CTA 07/01/2019 FINDINGS: Pharynx and larynx: Limited assessment due to motion artifact throughout the pharynx and larynx. No gross mass. Patent airway. No retropharyngeal fluid. Salivary glands: Asymmetric enlargement and heterogeneous hyperenhancement of the right parotid gland with mild inflammatory stranding in the parotid space and overlying subcutaneous tissues extending inferiorly in the face. No salivary stone or fluid collection  identified. Unremarkable left parotid gland. Limited assessment of the submandibular glands due to motion. Thyroid: Asymmetric enlargement of the right thyroid lobe which extends into the retropharyngeal region with an underlying 3.9 cm nodule, unchanged from the prior CTA and previously evaluated by ultrasound and biopsy. Lymph nodes: No enlarged or suspicious lymph nodes in the neck. Vascular: Aortic and carotid atherosclerosis. Limited assessment of known proximal right ICA stenosis due to motion artifact. Limited intracranial: Unremarkable. Visualized orbits: Bilateral cataract extraction and glaucoma drainage devices. Mastoids and visualized paranasal sinuses: Minimal left maxillary sinus mucosal thickening. Clear mastoid air cells. Skeleton: Left greater than right cervical facet arthrosis with slight anterolisthesis of C4 on C5 and C5 on C6. Left facet ankylosis at C2-3. Upper chest: Motion  artifact through the lung apices with partially visualized patchy opacities in the right upper lobe and superior segment of the right lower lobe. CABG. Pacemaker. Other: None. IMPRESSION: 1. Acute right parotitis.  No salivary stone or fluid collection. 2. Partially visualized patchy pulmonary opacities in the right upper and lower lobes compatible with pneumonia. 3. 3.9 cm right thyroid nodule. This has been documented on prior studies. 4. Aortic Atherosclerosis (ICD10-I70.0). Electronically Signed   By: Logan Bores M.D.   On: 02/27/2020 13:15   Portable chest 1 View  Result Date: 02/28/2020 CLINICAL DATA:  Pneumonia EXAM: PORTABLE CHEST 1 VIEW COMPARISON:  Yesterday FINDINGS: Indistinct opacity at the lung bases. Aeration is worse compared yesterday, with more poorly defined left diaphragm. Biventricular pacer and CABG. Leftward deviation of the trachea from large thyroid nodule by CT last month IMPRESSION: Atelectasis or infection at the bases with worsening aeration since yesterday. Electronically Signed   By: Monte Fantasia M.D.   On: 02/28/2020 09:10   DG Chest Port 1 View  Result Date: 02/27/2020 CLINICAL DATA:  Altered mental status, shortness of breath EXAM: PORTABLE CHEST 1 VIEW COMPARISON:  01/30/2020 FINDINGS: Prior CABG. Left pacer is unchanged. Heart is normal size. Bibasilar atelectasis or infiltrates. No effusions or acute bony abnormality. IMPRESSION: Bibasilar atelectasis or infiltrates/consolidation. Electronically Signed   By: Rolm Baptise M.D.   On: 02/27/2020 09:50      Subjective: Nonverbal. No overnight events, has weaned to room air.  Discharge Exam: Vitals:   03/05/20 0738 03/05/20 1141  BP:  (!) 132/50  Pulse:  74  Resp:  (!) 28  Temp:  99.2 F (37.3 C)  SpO2: 100% 97%   General: Chronically ill-appearing female laying with mouth wide open. HEENT: Parotids appear normal and symmetric, no erythema, swelling or seeming tenderness Cardiovascular: RRR, S1/S2 +, no  rubs, no gallops Respiratory: Nonlabored, tachypneic, and clear. Abdominal: Soft, NT, ND, bowel sounds + Extremities: RUE edema stable, none otherwise. Neuro: Opens eyes to voice, tracks, nonverbal. RUE flaccid and not spontaneously moving other extremities.  Labs: BNP (last 3 results) No results for input(s): BNP in the last 8760 hours. Basic Metabolic Panel: Recent Labs  Lab 02/29/20 1145 03/01/20 0237 03/02/20 0304 03/03/20 0240 03/04/20 0208  NA 144 137 140 139 141  K 3.2* 3.4* 3.2* 3.0* 3.1*  CL 112* 108 108 108 110  CO2 25 23 22  21* 25  GLUCOSE 153* 264* 254* 381* 375*  BUN 36* 29* 18 17 15   CREATININE 1.24* 1.11* 0.91 0.89 0.89  CALCIUM 10.6* 9.8 9.5 9.0 9.0  MG  --  1.7  --   --   --   PHOS  --   --  1.4* 1.8* 1.8*   Liver Function  Tests: Recent Labs  Lab 03/02/20 0304 03/03/20 0240 03/04/20 0208  AST 19  --   --   ALT 23  --   --   ALKPHOS 83  --   --   BILITOT 0.5  --   --   PROT 5.8*  --   --   ALBUMIN 1.9* 1.8* 1.7*   No results for input(s): LIPASE, AMYLASE in the last 168 hours. No results for input(s): AMMONIA in the last 168 hours. CBC: Recent Labs  Lab 02/29/20 1145 03/01/20 0237 03/02/20 0304 03/03/20 0240 03/04/20 0208  WBC 17.4* 17.7* 15.5* 15.4* 14.0*  HGB 9.0* 9.1* 9.6* 9.7* 9.1*  HCT 30.5* 30.6* 31.7* 32.2* 29.9*  MCV 92.7 92.4 91.1 91.2 89.3  PLT 288 293 293 312 300   Cardiac Enzymes: No results for input(s): CKTOTAL, CKMB, CKMBINDEX, TROPONINI in the last 168 hours. BNP: Invalid input(s): POCBNP CBG: Recent Labs  Lab 03/04/20 1923 03/04/20 2311 03/05/20 0308 03/05/20 0945 03/05/20 1146  GLUCAP 337* 324* 290* 325* 303*   D-Dimer No results for input(s): DDIMER in the last 72 hours. Hgb A1c No results for input(s): HGBA1C in the last 72 hours. Lipid Profile No results for input(s): CHOL, HDL, LDLCALC, TRIG, CHOLHDL, LDLDIRECT in the last 72 hours. Thyroid function studies No results for input(s): TSH, T4TOTAL,  T3FREE, THYROIDAB in the last 72 hours.  Invalid input(s): FREET3 Anemia work up No results for input(s): VITAMINB12, FOLATE, FERRITIN, TIBC, IRON, RETICCTPCT in the last 72 hours. Urinalysis    Component Value Date/Time   COLORURINE YELLOW 02/28/2020 0710   APPEARANCEUR CLOUDY (A) 02/28/2020 0710   LABSPEC 1.033 (H) 02/28/2020 0710   PHURINE 5.0 02/28/2020 0710   GLUCOSEU NEGATIVE 02/28/2020 0710   HGBUR SMALL (A) 02/28/2020 0710   BILIRUBINUR NEGATIVE 02/28/2020 0710   KETONESUR NEGATIVE 02/28/2020 0710   PROTEINUR NEGATIVE 02/28/2020 0710   UROBILINOGEN 0.2 08/07/2015 1826   NITRITE NEGATIVE 02/28/2020 0710   LEUKOCYTESUR LARGE (A) 02/28/2020 0710    Microbiology Recent Results (from the past 240 hour(s))  SARS Coronavirus 2 by RT PCR (hospital order, performed in [place] hospital lab) Nasopharyngeal Nasopharyngeal Swab     Status: None   Collection Time: 02/27/20  9:50 AM   Specimen: Nasopharyngeal Swab  Result Value Ref Range Status   SARS Coronavirus 2 NEGATIVE NEGATIVE Final    Comment: (NOTE) SARS-CoV-2 target nucleic acids are NOT DETECTED. The SARS-CoV-2 RNA is generally detectable in upper and lower respiratory specimens during the acute phase of infection. The lowest concentration of SARS-CoV-2 viral copies this assay can detect is 250 copies / mL. A negative result does not preclude SARS-CoV-2 infection and should not be used as the sole basis for treatment or other patient management decisions.  A negative result may occur with improper specimen collection / handling, submission of specimen other than nasopharyngeal swab, presence of viral mutation(s) within the areas targeted by this assay, and inadequate number of viral copies (<250 copies / mL). A negative result must be combined with clinical observations, patient history, and epidemiological information. Fact Sheet for Patients:   StrictlyIdeas.no Fact Sheet for Healthcare  Providers: BankingDealers.co.za This test is not yet approved or cleared  by the [place] FDA and has been authorized for detection and/or diagnosis of SARS-CoV-2 by FDA under an Emergency Use Authorization (EUA).  This EUA will remain in effect (meaning this test can be used) for the duration of the COVID-19 declaration under Section 564(b)(1) of the Act, 21  U.S.C. section 360bbb-3(b)(1), unless the authorization is terminated or revoked sooner. Performed at [facility], [place] [address] [place], [place] [address]   Culture, blood (routine x 2)     Status: None   Collection Time: 02/27/20  9:59 AM   Specimen: BLOOD RIGHT WRIST  Result Value Ref Range Status   Specimen Description BLOOD RIGHT WRIST  Final   Special Requests   Final    BOTTLES DRAWN AEROBIC AND ANAEROBIC Blood Culture results may not be optimal due to an inadequate volume of blood received in culture bottles   Culture   Final    NO GROWTH 5 DAYS Performed at [facility], [place] [address] [place], [place] [address]    Report Status 03/03/2020 FINAL  Final  Culture, blood (routine x 2)     Status: None   Collection Time: 02/27/20 10:22 AM   Specimen: BLOOD  Result Value Ref Range Status   Specimen Description BLOOD SITE NOT SPECIFIED  Final   Special Requests   Final    BOTTLES DRAWN AEROBIC AND ANAEROBIC Blood Culture results may not be optimal due to an excessive volume of blood received in culture bottles   Culture   Final    NO GROWTH 5 DAYS Performed at [facility], [place] [address] [place], [place] [address]    Report Status 03/03/2020 FINAL  Final  MRSA PCR Screening     Status: None   Collection Time: 02/27/20 11:16 PM   Specimen: Nasal Mucosa; Nasopharyngeal  Result Value Ref Range Status   MRSA by PCR NEGATIVE NEGATIVE Final    Comment:        The GeneXpert MRSA Assay (FDA approved for NASAL specimens only), is one component of a comprehensive MRSA  colonization surveillance program. It is not intended to diagnose MRSA infection nor to guide or monitor treatment for MRSA infections. Performed at [facility], [place] [address], [place], [place] [address]   Urine culture     Status: Abnormal   Collection Time: 02/28/20  7:10 AM   Specimen: In/Out Cath Urine  Result Value Ref Range Status   Specimen Description IN/OUT CATH URINE  Final   Special Requests   Final    NONE Performed at [facility], [place] [address] [place],  [address]    Culture >=100,000 COLONIES/mL YEAST (A)  Final   Report Status 02/29/2020 FINAL  Final    Time coordinating discharge: Approximately 40 minutes  Patrecia Pour, MD  [facility] 03/05/2020, 12:31 PM

## 2020-03-05 NOTE — Procedures (Signed)
Patient was NTS for copious amounts of pale yellow secretions.  Remains on 3lpm [place] with sats of 100%.

## 2020-03-05 NOTE — TOC Progression Note (Signed)
Transition of Care [facility]) - Progression Note    Patient Details  Name: [name] MRN: [id number] Date of Birth: [birth year]  [facility] CM/SW Contact  Bartholomew Crews, RN Phone Number: [phone] 03/05/2020, 10:09 AM  Clinical Narrative:     Acknowledging consult for hospice at home. Previous NCM had already noted family preference for [facility]. Referral placed to [facility] for hospice at home. TOC following for transition needs.   Expected Discharge Plan: Home w Hospice Care Barriers to Discharge: Continued Medical Work up  Expected Discharge Plan and Services Expected Discharge Plan: [place]   Discharge Planning Services: CM Consult   Living arrangements for the past 2 months: [place] Expected Discharge Date: 03/05/20                                     Social Determinants of Health (SDOH) Interventions    Readmission Risk Interventions Readmission Risk Prevention Plan 03/01/2020  Transportation Screening Complete  Medication Review Press photographer) Complete  HRI or Home Care Consult Complete  SW Recovery Care/Counseling Consult Complete  Palliative Care Screening Complete  Some recent data might be hidden

## 2020-03-06 LAB — GLUCOSE, CAPILLARY
Glucose-Capillary: 289 mg/dL — ABNORMAL HIGH (ref 70–99)
Glucose-Capillary: 289 mg/dL — ABNORMAL HIGH (ref 70–99)

## 2020-03-06 LAB — PHOSPHORUS: Phosphorus: 1.8 mg/dL — ABNORMAL LOW (ref 2.5–4.6)

## 2020-03-06 MED ORDER — CHLORHEXIDINE GLUCONATE CLOTH 2 % EX PADS
6.0000 | MEDICATED_PAD | Freq: Every day | CUTANEOUS | Status: DC
  Administered 2020-03-06: 6 via TOPICAL

## 2020-03-06 MED ORDER — LORAZEPAM 0.5 MG PO TABS
0.5000 mg | ORAL_TABLET | Freq: Four times a day (QID) | ORAL | 0 refills | Status: AC | PRN
Start: 2020-03-06 — End: ?

## 2020-03-06 MED ORDER — K PHOS MONO-SOD PHOS DI & MONO 155-852-130 MG PO TABS: 500.0000 mg | ORAL_TABLET | Freq: Two times a day (BID) | ORAL | 0 refills | Status: AC

## 2020-03-06 MED ORDER — LORAZEPAM 0.5 MG PO TABS
0.5000 mg | ORAL_TABLET | Freq: Once | ORAL | Status: AC
  Administered 2020-03-06: 0.5 mg
  Filled 2020-03-06: qty 1

## 2020-03-06 NOTE — TOC Transition Note (Signed)
Transition of Care [facility]) - CM/SW Discharge Note   Patient Details  Name: [name] MRN: [id number] Date of Birth: [birth year]  Transition of Care [facility]) CM/SW Contact:  [name], RN Phone Number: 03/06/2020, 9:19 AM   Clinical Narrative:    Patient is for dc to home with hospice with [facility] via ptar. NCM spoke with patient daughter, [name], she is ready to received patient , address confirmed. MD states he will come to sign the DNR within the hour.  PTAR is scheduled for 10:15 pickup.    Final next level of care: Home w Hospice Care Barriers to Discharge: No Barriers Identified   Patient Goals and CMS Choice   CMS Medicare.gov Compare Post Acute Care list provided to:: Patient Represenative (must comment) Choice offered to / list presented to : Adult Children  Discharge Placement                       Discharge Plan and Services   Discharge Planning Services: CM Consult                                 Social Determinants of Health (SDOH) Interventions     Readmission Risk Interventions Readmission Risk Prevention Plan 03/01/2020  Transportation Screening Complete  Medication Review Press photographer) Complete  HRI or Home Care Consult Complete  SW Recovery Care/Counseling Consult Complete  Palliative Care Screening Complete  Some recent data might be hidden

## 2020-03-20 DEATH — deceased

## 2020-07-20 IMAGING — CT CT ANGIO NECK
3 of 7 series · 10 of 36 positions shown · IV contrast (APPLIED)
Comparison: Plain head CT 7787 hours today.

CLINICAL DATA: Code stroke. 89-year-old female with unexplained
altered mental status and slurred speech. Last seen normal 0119
hours.

EXAM:
CT ANGIOGRAPHY HEAD AND NECK
TECHNIQUE: Multidetector CT imaging of the head and neck was performed using
the standard protocol during bolus administration of intravenous
contrast. Multiplanar CT image reconstructions and MIPs were
obtained to evaluate the vascular anatomy. Carotid stenosis
measurements (when applicable) are obtained utilizing NASCET
criteria, using the distal internal carotid diameter as the
denominator.
CONTRAST:  60mL OMNIPAQUE IOHEXOL 350 MG/ML SOLN

[Series 5: cta neck/head · axial · 0.50mm/px · z∈[+1138,+1244]mm · 2 of 159 slices shown]
[im 53/159  soft-tissue]
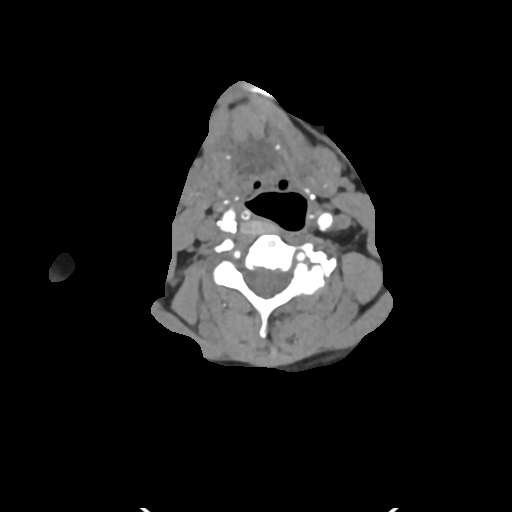
[im 106/159  soft-tissue]
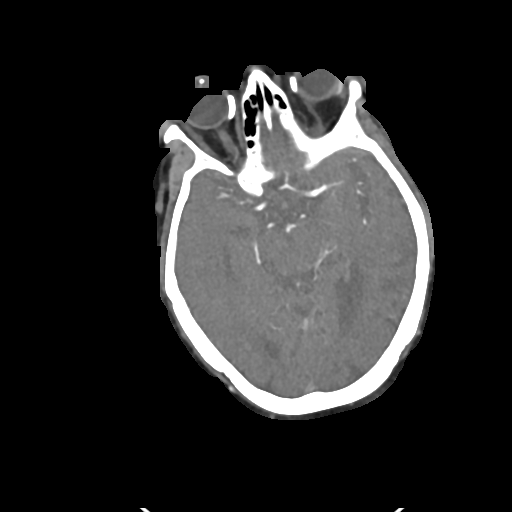

[Series 7: ax thins · axial · 0.45mm/px · z∈[+1083,+1315]mm · 6 of 330 slices shown]
[im 48/330  soft-tissue]
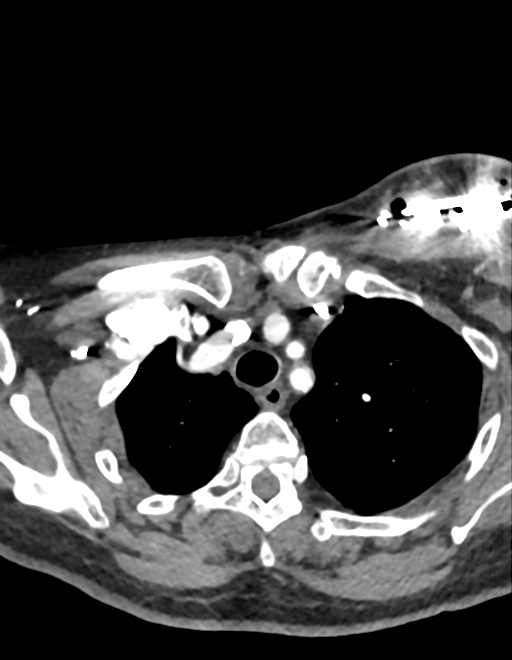
[im 95/330  bone]
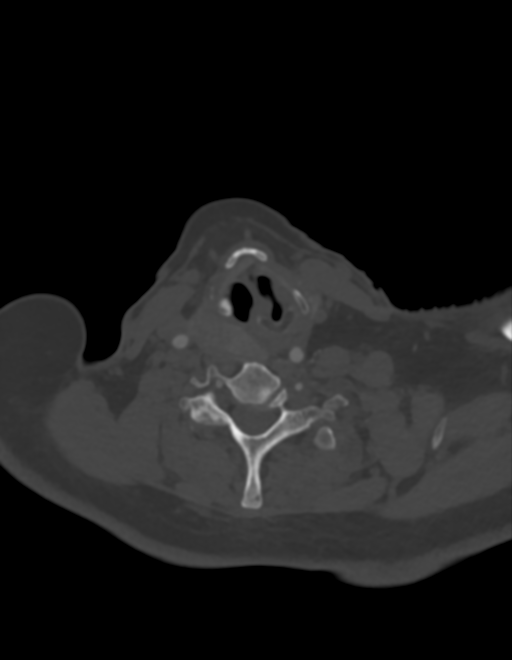
[im 142/330  soft-tissue]
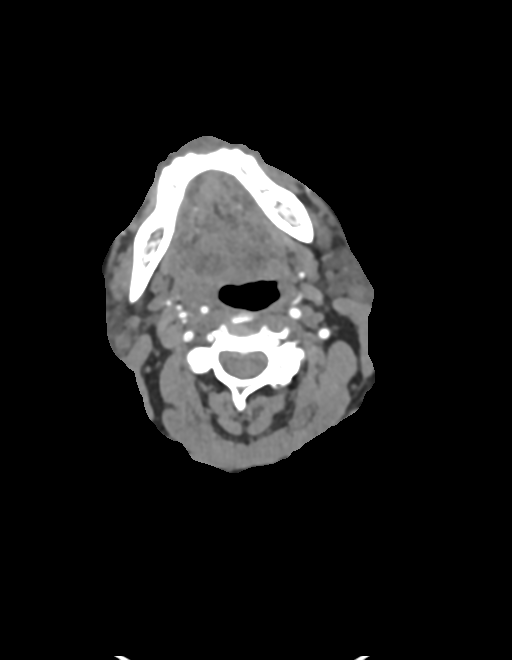
[im 189/330  bone]
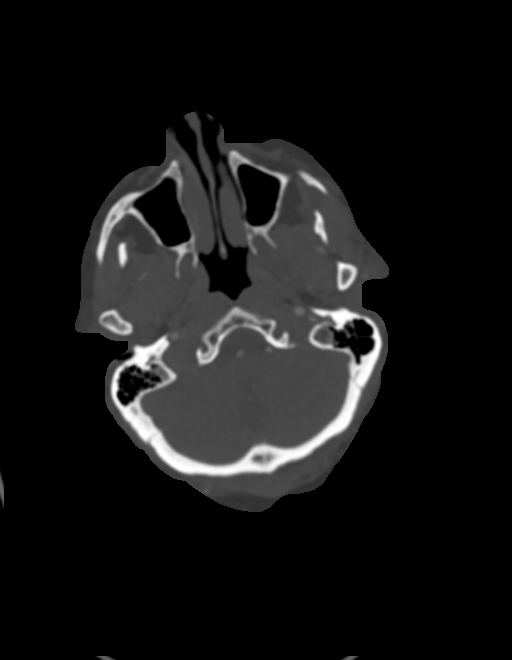
[im 236/330  soft-tissue]
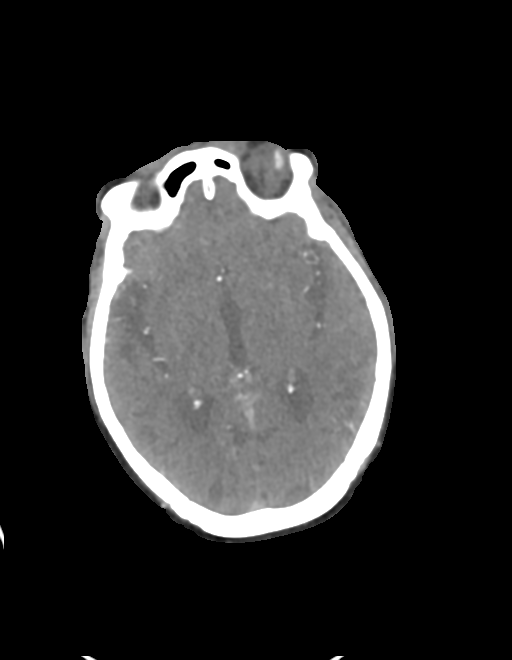
[im 283/330  bone]
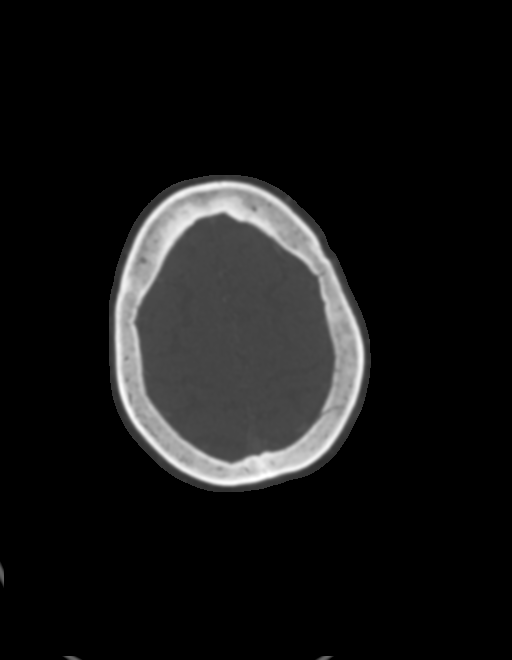

[Series 9: sag thins · sagittal · 0.52mm/px · 2 of 201 slices shown]
[im 71/201  soft-tissue]
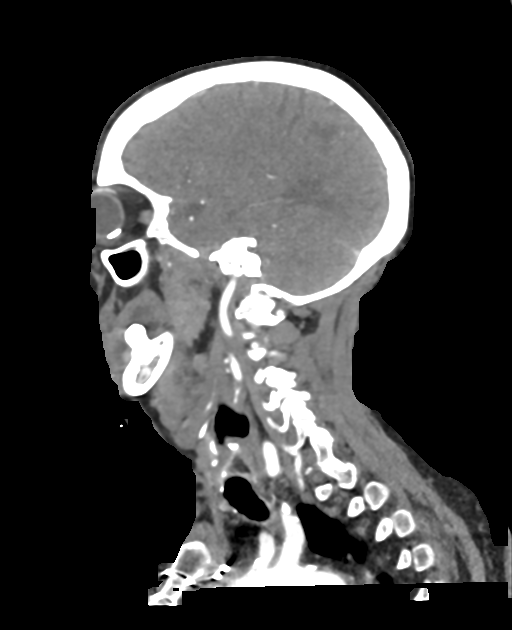
[im 131/201  soft-tissue]
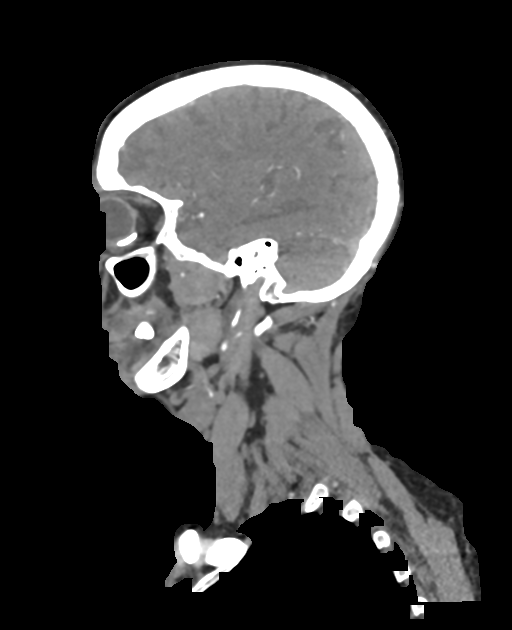

[10 of 36 positions shown; findings below may reference images not displayed]

FINDINGS: CTA NECK

Skeleton: Areas of poor dentition. Upper cervical spine ankylosis
and degeneration. Prior sternotomy. No acute osseous abnormality
identified.

Upper chest: Negative visible upper lungs. No superior mediastinal
lymphadenopathy. Partially visible left chest cardiac pacemaker type
device.

Other neck: Large right thyroid goiter with some retrosternal
extension. Mild regional mass effect, no airway narrowing.

Other neck soft tissues are within normal limits.

Aortic arch: Calcified aortic atherosclerosis. Three vessel arch
configuration.

Right carotid system: Streak artifact at the thoracic inlet
affecting the right CCA origin. Tortuous brachiocephalic artery and
proximal right CCA, partially due to mass effect from the right
thyroid goiter. No definite proximal right CCA stenosis. Calcified
plaque at the right ICA origin and bulb resulting in less than 50 %
stenosis with respect to the distal vessel.

Left carotid system: No left CCA origin stenosis despite plaque.
Mildly tortuous left CCA. Soft and calcified plaque at the left ICA
origin and bulb without stenosis to the skull base.

Vertebral arteries:
Proximal right subclavian artery calcified plaque resulting in right
subclavian origin stenosis of 70 % with respect to the distal vessel
(series 8, image 121). Superimposed bulky calcified plaque at the
right vertebral artery origin but only mild stenosis. The right
vertebral artery is patent to the skull base with mild V3 segment
calcified plaque, no extracranial stenosis.

Soft and calcified plaque in the proximal left subclavian artery
without significant stenosis. Plaque near the left vertebral artery,
but no origin stenosis. The left vertebral is mildly dominant and
patent to the skull base without stenosis.

CTA HEAD

Posterior circulation: Bilateral proximal V4 segment calcified
plaque. Moderate associated bilateral distal vertebral artery
stenosis (series 7, image 156). Both PICA origins are patent nearby.
Both vertebral arteries remain patent to the vertebrobasilar
junction without additional plaque or stenosis. The basilar artery
is diminutive but patent. SCA and PCA origins are patent. There is
mild irregularity and stenosis at the left PCA origin on series 11,
image 25. Right PCA branches are within normal limits. Mild left PCA
branch irregularity. Posterior communicating arteries are diminutive
or absent.

Anterior circulation: Both ICA siphons are patent. Moderate
calcified plaque on the left with moderate multifocal left ICA
siphon stenosis including the cavernous and supraclinoid segments.
On the right there is widespread siphon calcification and severe
intermittent stenosis of the right siphon from the mid cavernous
segment through the supraclinoid segment. See series 7, image 120).

But both carotid termini remain patent. The left is dominant with a
dominant left and diminutive or absent right ACA A1 segment. There
is a dominant A2, simulating azygos ACA anatomy. ACA branches are
within normal limits. Left MCA M1 and bifurcation are patent without
stenosis. Left MCA branches within normal limits. Right MCA M1 and
bifurcation are patent without stenosis. Right MCA branches are
within normal limits.

Venous sinuses: Early contrast timing, not well evaluated.

Anatomic variants: Mildly dominant left vertebral artery. Dominant
left and diminutive or absent right ACA A1 segments.

Review of the MIP images confirms the above findings
IMPRESSION: 1. Negative for large vessel occlusion.
2. But positive for multiple hemodynamically significant stenoses:
- Severe Right ICA siphon stenosis due to calcified plaque.
- Moderate contralateral left ICA siphon stenosis due to calcified
plaque.
- 70% stenosis of the right subclavian artery origin.
- Moderate stenosis of both distal vertebral arteries as they cross
the dura due to calcified plaque.
3. Aortic Atherosclerosis ([id number]).
4. Right thyroid goiter.

Arterial findings were communicated to Dr. Tiger at [DATE] on
07/01/2019 by text page via the [employer] messaging system.

## 2020-07-20 IMAGING — CT CT HEAD CODE STROKE
5 of 8 series · 17 of 47 positions shown, 18 images · non-contrast
Comparison: Head CT 02/09/2016.

CLINICAL DATA: Code stroke. 89-year-old female with unexplained
altered mental status and slurred speech. Last seen normal 4467
hours.

EXAM:
CT HEAD WITHOUT CONTRAST
TECHNIQUE: Contiguous axial images were obtained from the base of the skull
through the vertex without intravenous contrast.

[Series 3: head wo · axial · 0.41mm/px · z∈[+1232,+1286]mm · 2 of 35 slices shown, 3 images (1 of 2)]
[im 12/35  brain]
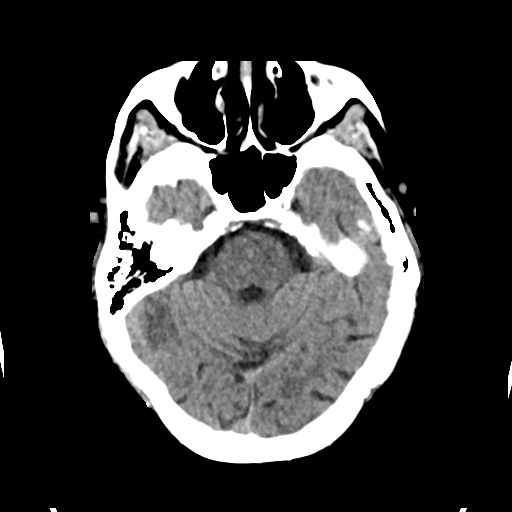
[im 12/35  bone]
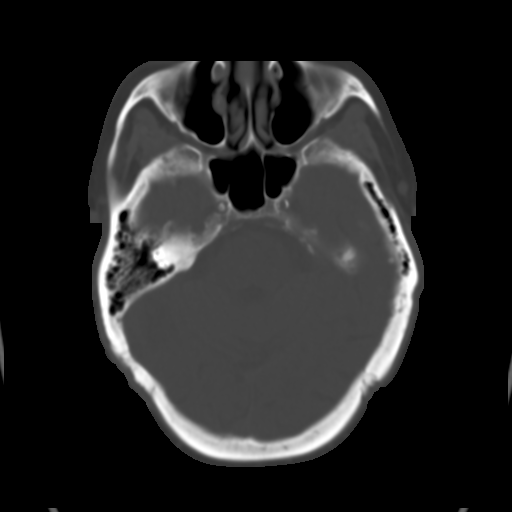
[im 23/35  brain]
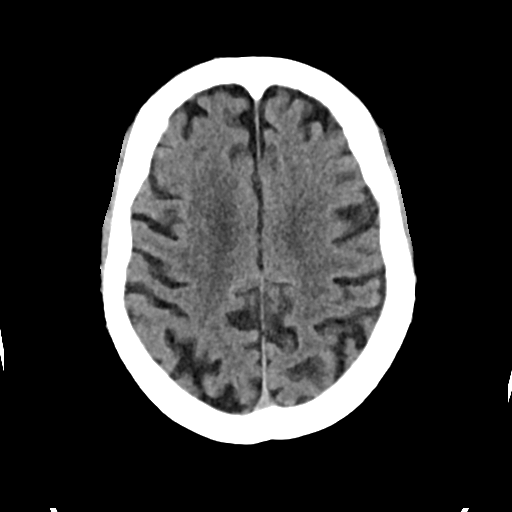

[Series 4: head bone · axial · 0.41mm/px · z∈[+1192,+1314]mm · 7 of 88 slices shown]
[im 9/88  bone]
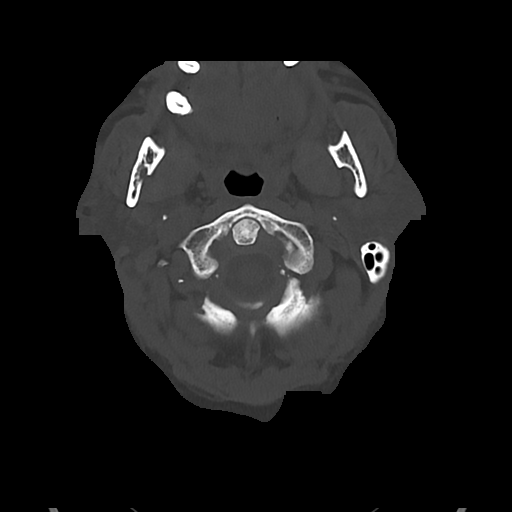
[im 18/88  bone]
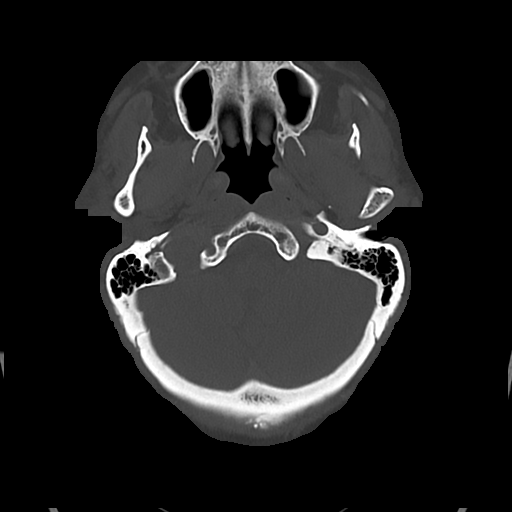
[im 27/88  bone]
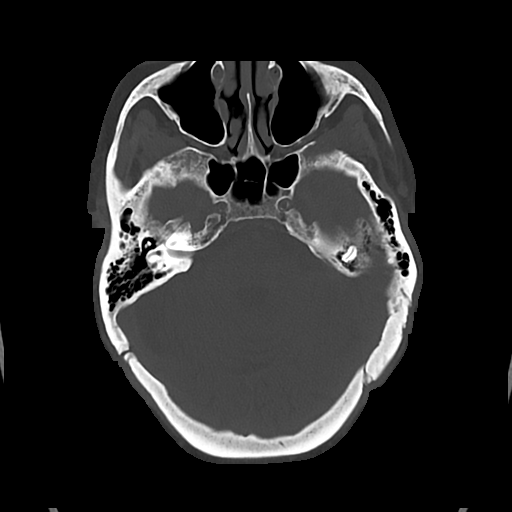
[im 35/88  bone]
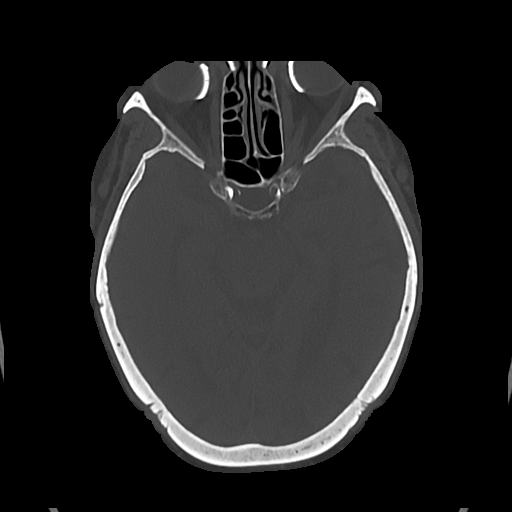
[im 53/88  bone]
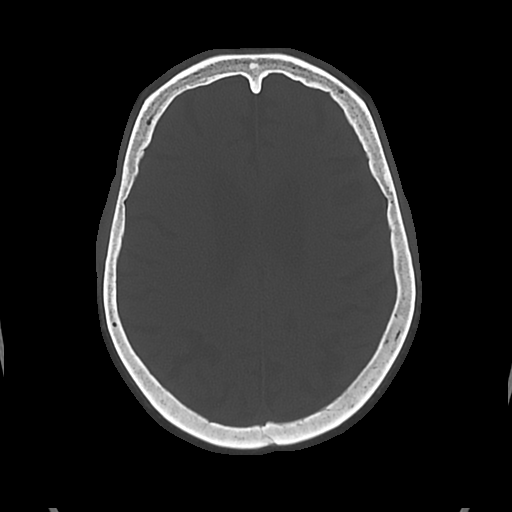
[im 61/88  bone]
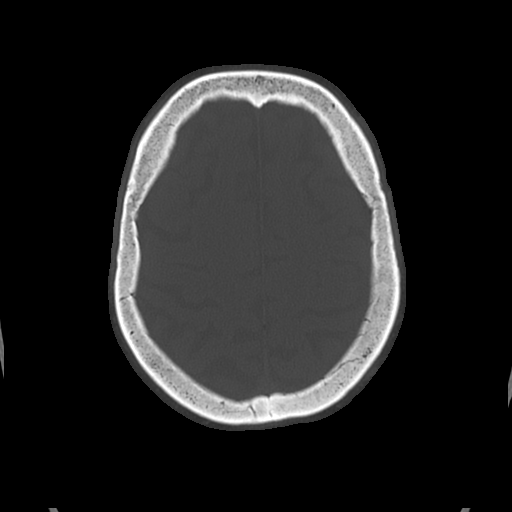
[im 70/88  bone]
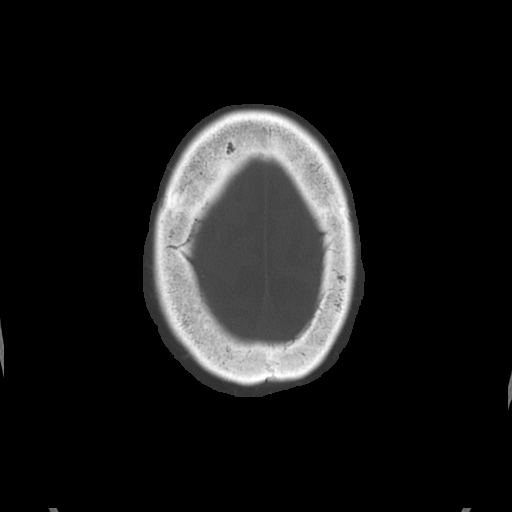

[Series 6: sag soft · sagittal · 0.37mm/px · 3 of 59 slices shown]
[im 45/59  brain]
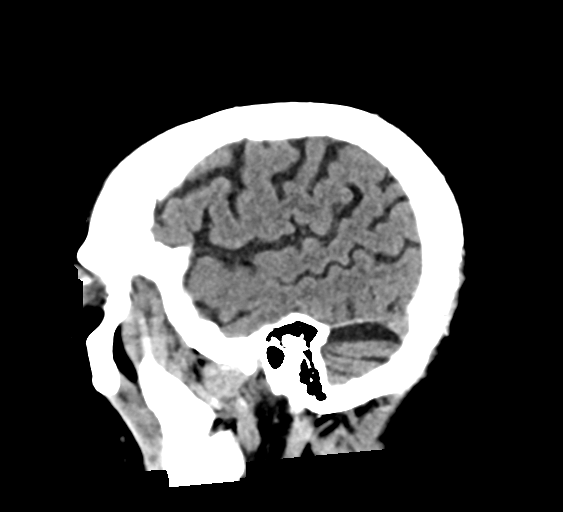
[im 49/59  brain]
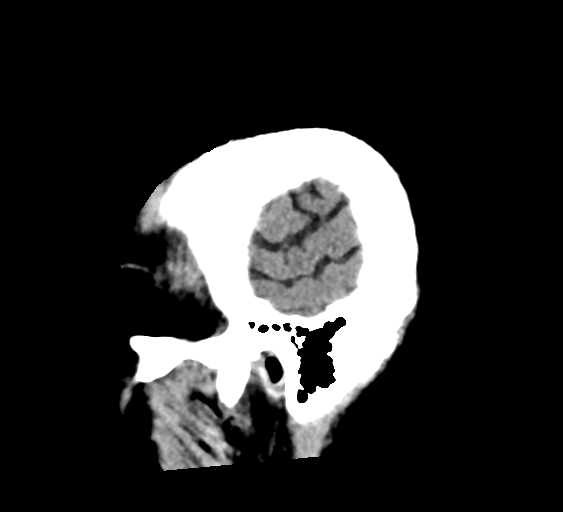
[im 54/59  brain]
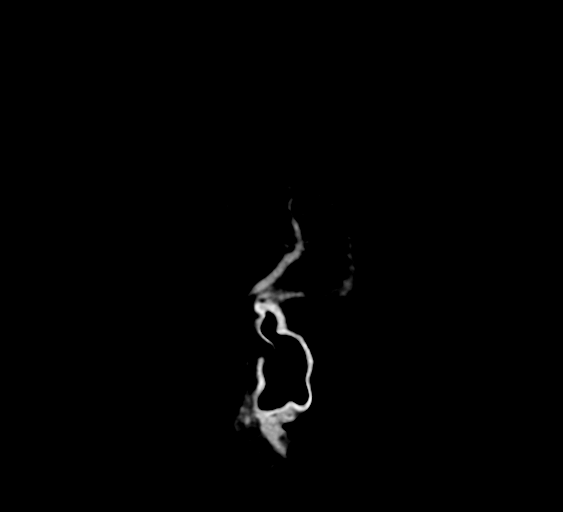

[Series 7: head wo · axial · 0.41mm/px · z∈[+1232,+1286]mm · 2 of 35 slices shown (2 of 2)]
[im 12/35  brain]
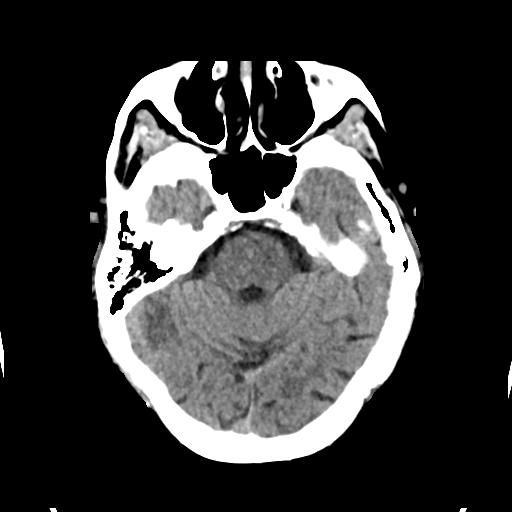
[im 23/35  brain]
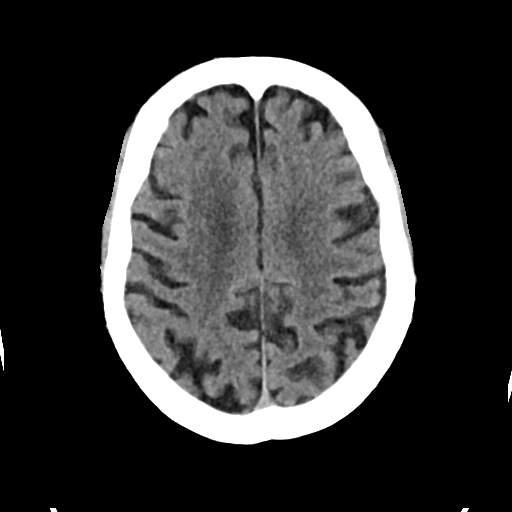

[Series 9: cor soft · coronal · 0.34mm/px · 3 of 66 slices shown]
[im 17/66  brain]
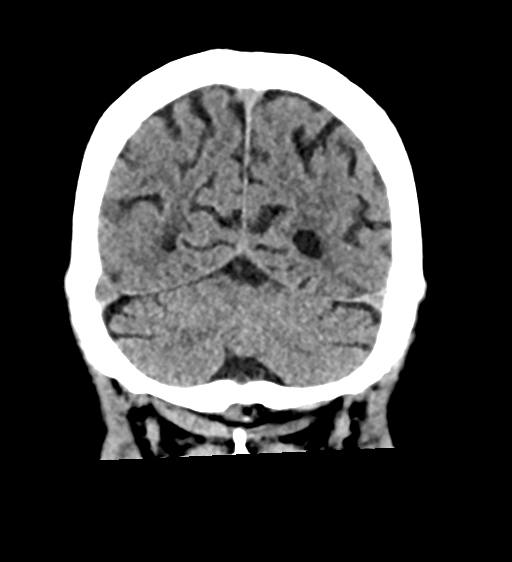
[im 33/66  brain]
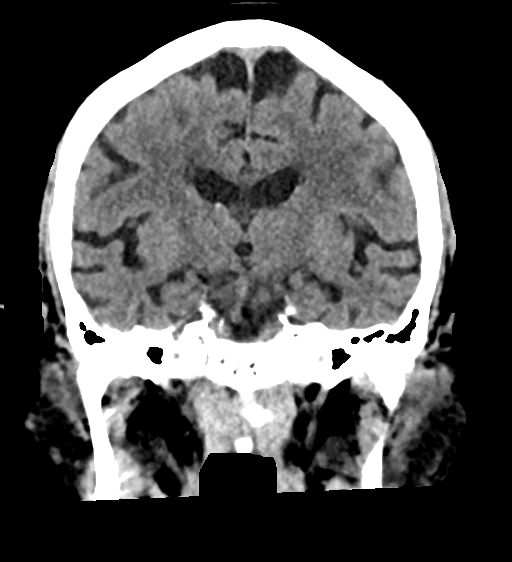
[im 49/66  brain]
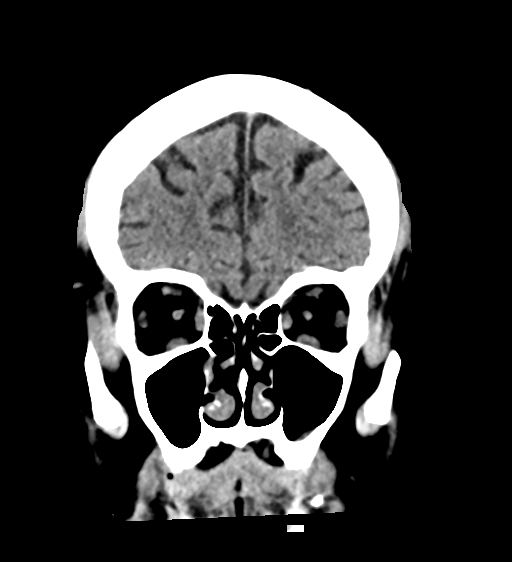

[17 of 47 positions shown; findings below may reference images not displayed]

FINDINGS: Brain: Stable cerebral volume since 4965. No ventriculomegaly. No
midline shift, mass effect, or evidence of intracranial mass lesion.
No acute intracranial hemorrhage identified.

Patchy white matter hypodensity in both hemispheres appears mild for
age and stable.

No cortically based acute infarct identified. No cortical
encephalomalacia identified.

Vascular: Calcified atherosclerosis at the skull base. No suspicious
intracranial vascular hyperdensity.

Skull: No acute osseous abnormality identified.

Sinuses/Orbits: Visualized paranasal sinuses and mastoids are stable
and well pneumatized.

Other: Interval postoperative changes to both globes. No acute orbit
or scalp soft tissue finding.

ASPECTS ([place] Stroke Program Early CT Score)

Total score (0-10 with 10 being normal): 10
IMPRESSION: 1. No acute intracranial abnormality. Stable non contrast CT
appearance of the brain since [DATE].  ASPECTS 10.
3. These results were communicated to [place]. [REDACTED] and [place] at [DATE] on 07/01/2019 by text page via the [employer] messaging system.
